# Patient Record
Sex: Male | Born: 1965 | Hispanic: No
Health system: Midwestern US, Community
[De-identification: ages and names within clinical notes are randomized; demographics above are authoritative.]

## PROBLEM LIST (undated history)

## (undated) DIAGNOSIS — Z139 Encounter for screening, unspecified: Secondary | ICD-10-CM

## (undated) DIAGNOSIS — R1909 Other intra-abdominal and pelvic swelling, mass and lump: Secondary | ICD-10-CM

## (undated) DIAGNOSIS — I10 Essential (primary) hypertension: Secondary | ICD-10-CM

## (undated) DIAGNOSIS — E119 Type 2 diabetes mellitus without complications: Secondary | ICD-10-CM

## (undated) DIAGNOSIS — C159 Malignant neoplasm of esophagus, unspecified: Secondary | ICD-10-CM

## (undated) DIAGNOSIS — D500 Iron deficiency anemia secondary to blood loss (chronic): Secondary | ICD-10-CM

## (undated) HISTORY — PX: OTHER SURGICAL HISTORY: SHX169

## (undated) HISTORY — DX: Malignant neoplasm of esophagus, unspecified: C15.9

## (undated) HISTORY — DX: Iron deficiency anemia secondary to blood loss (chronic): D50.0

---

## 2001-07-23 ENCOUNTER — Emergency Department (HOSPITAL_COMMUNITY): Admission: EM | Admit: 2001-07-23 | Discharge: 2001-07-23 | Payer: Self-pay | Admitting: Emergency Medicine

## 2002-08-26 ENCOUNTER — Emergency Department (HOSPITAL_COMMUNITY): Admission: EM | Admit: 2002-08-26 | Discharge: 2002-08-26 | Payer: Self-pay | Admitting: Emergency Medicine

## 2005-05-16 ENCOUNTER — Emergency Department (HOSPITAL_COMMUNITY): Admission: EM | Admit: 2005-05-16 | Discharge: 2005-05-16 | Payer: Self-pay | Admitting: Emergency Medicine

## 2006-12-27 ENCOUNTER — Emergency Department (HOSPITAL_COMMUNITY): Admission: EM | Admit: 2006-12-27 | Discharge: 2006-12-27 | Payer: Self-pay | Admitting: Emergency Medicine

## 2007-03-24 ENCOUNTER — Emergency Department (HOSPITAL_COMMUNITY): Admission: EM | Admit: 2007-03-24 | Discharge: 2007-03-24 | Payer: Self-pay | Admitting: Emergency Medicine

## 2007-06-23 ENCOUNTER — Emergency Department (HOSPITAL_COMMUNITY): Admission: EM | Admit: 2007-06-23 | Discharge: 2007-06-23 | Payer: Self-pay | Admitting: Emergency Medicine

## 2008-01-08 ENCOUNTER — Emergency Department (HOSPITAL_COMMUNITY): Admission: EM | Admit: 2008-01-08 | Discharge: 2008-01-08 | Payer: Self-pay | Admitting: Emergency Medicine

## 2008-12-06 ENCOUNTER — Emergency Department (HOSPITAL_COMMUNITY): Admission: EM | Admit: 2008-12-06 | Discharge: 2008-12-06 | Payer: Self-pay | Admitting: Emergency Medicine

## 2011-05-23 LAB — BASIC METABOLIC PANEL
BUN: 7
CO2: 29
Calcium: 8.3 — ABNORMAL LOW
Chloride: 100
Creatinine, Ser: 1.1
GFR calc Af Amer: >60
GFR calc non Af Amer: >60
Glucose, Bld: 122 — ABNORMAL HIGH
Potassium: 3.5
Sodium: 135

## 2011-05-23 LAB — CBC
HCT: 42
Hemoglobin: 14.2
MCHC: 33.8
MCV: 79
Platelets: 249
RBC: 5.31
RDW: 13.4
WBC: 7.3

## 2011-05-23 LAB — DIFFERENTIAL
Basophils Absolute: 0
Basophils Relative: 0
Eosinophils Absolute: 0.1
Eosinophils Relative: 2
Lymphocytes Relative: 30
Lymphs Abs: 2.2
Monocytes Absolute: 0.4
Monocytes Relative: 6
Neutro Abs: 4.5
Neutrophils Relative %: 62

## 2011-05-23 LAB — URIC ACID: Uric Acid, Serum: 8.9 — ABNORMAL HIGH

## 2011-05-29 LAB — SYNOVIAL CELL COUNT + DIFF, W/ CRYSTALS
Eosinophils-Synovial: 0
Lymphocytes-Synovial Fld: 2
Monocyte-Macrophage-Synovial Fluid: 14
Neutrophil, Synovial: 84
WBC, Synovial: 22310

## 2011-05-29 LAB — BODY FLUID CULTURE: Culture: NO GROWTH

## 2011-05-29 LAB — SEDIMENTATION RATE: Sed Rate: 5

## 2011-05-29 LAB — URIC ACID: Uric Acid, Serum: 9.6 — ABNORMAL HIGH

## 2012-12-18 ENCOUNTER — Encounter

## 2012-12-19 LAB — C REACTIVE PROTEIN, QT: C-Reactive protein: 0.33 mg/dL — ABNORMAL HIGH (ref <0.32)

## 2012-12-19 LAB — SED RATE, AUTOMATED: Sed rate, automated: 10 mm/hr (ref 0–15)

## 2012-12-20 LAB — ANA BY MULTIPLEX FLOW IA, QL
ANA, Direct: NEGATIVE
ANA: NEGATIVE

## 2012-12-20 LAB — DNA AB, DOUBLE STRANDED, IGG: Anti-DNA (DS) Ab, QT: <1 IU/mL (ref 0–9)

## 2012-12-20 LAB — SMITH ANTIBODIES: Smith Abs: <0.2 AI (ref 0.0–0.9)

## 2012-12-20 LAB — MISC. LAB TEST: Test Description:: 15271

## 2012-12-23 LAB — HLA-B27
HLA-B27: NEGATIVE
HLA-B27: NEGATIVE

## 2013-12-22 ENCOUNTER — Encounter

## 2013-12-23 LAB — GLUCOSE TOLERANCE,2 HR. W/URINES
GLUCOSE, 1 HR: 228 MG/DL
GLUCOSE, 2 HR: 105 MG/DL
GLUCOSE, FASTING: 114 MG/DL — ABNORMAL HIGH (ref 65–110)

## 2014-04-21 NOTE — Progress Notes (Signed)
[facility] SURGICAL SERVICES CASE SCHEDULING FORM  Phone:  [phone] Fax:  [phone]  Faxes are considered tentatively scheduled until confirmed by the [facility] scheduling office    Surgeon Name: Dr. Yetta Barre  Surgery Date & Time:  05/04/14  Surgery Location: SDS  Patient Name: Larry Fernandez  Patient Date Of Birth: 10-17-1965  Patient Social Security: [id number]   Gender:  male  Patient Phone: [phone]  Patient Type: Outpatient  Anesthesia Type:  Local mac  CPT Code(s):  11404      Patient Pre-op diagnosis: cyst left upper quadrant  Procedure: cyst excision left upper quadrant  Special Needs: none  Insurance (Primary and Secondary): [employer]  Insurance Policy #(s):      [employer]

## 2014-04-28 ENCOUNTER — Encounter

## 2014-04-28 LAB — METABOLIC PANEL, COMPREHENSIVE
A-G Ratio: 1.4 (ref 1.2–2.2)
ALT (SGPT): 37 U/L (ref 12–78)
AST (SGOT): 25 U/L (ref 15–37)
Albumin: 4.2 g/dL (ref 3.4–5.0)
Alk. phosphatase: 74 U/L (ref 45–117)
Anion gap: 5 mmol/L — ABNORMAL LOW (ref 6–15)
BUN/Creatinine ratio: 9 (ref 7–25)
BUN: 9 MG/DL (ref 7–18)
Bilirubin, total: 0.4 MG/DL (ref <1.1)
CO2: 29 mmol/L (ref 21–32)
Calcium: 8.4 MG/DL — ABNORMAL LOW (ref 8.5–10.1)
Chloride: 105 mmol/L (ref 98–107)
Creatinine: 1 MG/DL (ref 0.60–1.30)
GFR est AA: >60 mL/min/{1.73_m2} (ref >60)
GFR est non-AA: >60 mL/min/{1.73_m2} (ref >60)
Globulin: 3.1 g/dL (ref 2.4–3.5)
Glucose: 127 mg/dL — ABNORMAL HIGH (ref 70–110)
Potassium: 3.9 mmol/L (ref 3.5–5.3)
Protein, total: 7.3 g/dL (ref 6.4–8.2)
Sodium: 139 mmol/L (ref 136–145)

## 2014-04-28 LAB — CBC WITH AUTOMATED DIFF
ABS. BASOPHILS: 0 10*3/uL (ref 0.0–0.1)
ABS. EOSINOPHILS: 0.1 10*3/uL (ref 0.0–0.5)
ABS. LYMPHOCYTES: 2.4 10*3/uL (ref 0.8–3.5)
ABS. MONOCYTES: 0.4 10*3/uL — ABNORMAL LOW (ref 0.8–3.5)
ABS. NEUTROPHILS: 1.5 10*3/uL (ref 1.5–8.0)
BASOPHILS: 1 % (ref 0–2)
EOSINOPHILS: 2 % (ref 0–5)
HCT: 40.8 % — ABNORMAL LOW (ref 41–53)
HGB: 14.1 g/dL (ref 13.5–17.5)
LYMPHOCYTES: 52 % — ABNORMAL HIGH (ref 19–48)
MCH: 25.6 PG — ABNORMAL LOW (ref 27–31)
MCHC: 34.6 g/dL (ref 31–37)
MCV: 74 FL — ABNORMAL LOW (ref 78–98)
MONOCYTES: 9 % (ref 3–9)
MPV: 11 FL — ABNORMAL HIGH (ref 5.9–10.3)
NEUTROPHILS: 35 % — ABNORMAL LOW (ref 40–74)
NRBC: 0.8 PER 100 WBC — ABNORMAL HIGH
PLATELET: 263 10*3/uL (ref 130–400)
RBC: 5.51 M/uL (ref 4.7–6.1)
RDW: 13.7 % (ref 11.5–14.5)
WBC: 4.4 10*3/uL — ABNORMAL LOW (ref 4.5–10.8)

## 2014-05-04 ENCOUNTER — Inpatient Hospital Stay: Payer: PRIVATE HEALTH INSURANCE

## 2014-05-04 LAB — GLUCOSE, POC: Glucose (POC): 137 mg/dL — ABNORMAL HIGH (ref 70–110)

## 2014-05-04 MED ORDER — MIDAZOLAM 1 MG/ML IJ SOLN
1 mg/mL | Freq: Once | INTRAMUSCULAR | Status: DC
Start: 2014-05-04 — End: 2014-05-04

## 2014-05-04 MED ORDER — HYDROCODONE-ACETAMINOPHEN 5 MG-325 MG TAB
5-325 mg | ORAL_TABLET | Freq: Four times a day (QID) | ORAL | Status: AC | PRN
Start: 2014-05-04 — End: ?

## 2014-05-04 MED ORDER — LIDOCAINE-EPINEPHRINE 1 %-1:100,000 IJ SOLN
1 %-:00,000 | Freq: Once | INTRAMUSCULAR | Status: AC
Start: 2014-05-04 — End: 2014-05-04
  Administered 2014-05-04: 13:00:00 via SUBCUTANEOUS

## 2014-05-04 MED ORDER — FENTANYL CITRATE (PF) 50 MCG/ML IJ SOLN
50 mcg/mL | INTRAMUSCULAR | Status: DC | PRN
Start: 2014-05-04 — End: 2014-05-04

## 2014-05-04 MED ORDER — ONDANSETRON (PF) 4 MG/2 ML INJECTION
4 mg/2 mL | Freq: Once | INTRAMUSCULAR | Status: DC | PRN
Start: 2014-05-04 — End: 2014-05-04

## 2014-05-04 MED ADMIN — 0.9% sodium chloride irrigation 1000 mL: @ 13:00:00 | NDC 00409713809

## 2014-05-04 MED ADMIN — fentaNYL citrate (PF) injection: INTRAVENOUS | @ 13:00:00 | NDC 00641602725

## 2014-05-04 MED ADMIN — propofol (DIPRIVAN) 10 mg/mL injection: INTRAVENOUS | @ 13:00:00 | NDC 63323026929

## 2014-05-04 MED ADMIN — lidocaine (PF) (XYLOCAINE) 100 mg/5 mL (2 %) injection syringe: INTRAVENOUS | @ 13:00:00 | NDC 00409132305

## 2014-05-04 MED ADMIN — midazolam (VERSED) injection: INTRAVENOUS | @ 13:00:00 | NDC 00409230517

## 2014-05-04 MED ADMIN — lactated ringers infusion: INTRAVENOUS | @ 12:00:00 | NDC 00409795309

## 2014-05-04 MED FILL — MIDAZOLAM 1 MG/ML IJ SOLN: 1 mg/mL | INTRAMUSCULAR | Qty: 2

## 2014-05-04 MED FILL — NORMAL SALINE FLUSH 0.9 % INJECTION SYRINGE: INTRAMUSCULAR | Qty: 10

## 2014-05-04 MED FILL — LIDOCAINE-EPINEPHRINE 1 %-1:100,000 IJ SOLN: 1 %-:00,000 | INTRAMUSCULAR | Qty: 30

## 2014-05-04 MED FILL — SODIUM CHLORIDE 0.9 % IRRIGATION SOLN: 0.9 % | Qty: 1000

## 2014-05-04 MED FILL — LIDOCAINE (PF) 20 MG/ML (2 %) IV SYRINGE: 100 mg/5 mL (2 %) | INTRAVENOUS | Qty: 5

## 2014-05-04 MED FILL — DIPRIVAN 10 MG/ML INTRAVENOUS EMULSION: 10 mg/mL | INTRAVENOUS | Qty: 20

## 2014-05-04 MED FILL — LACTATED RINGERS IV: INTRAVENOUS | Qty: 1000

## 2014-05-04 MED FILL — FENTANYL CITRATE (PF) 50 MCG/ML IJ SOLN: 50 mcg/mL | INTRAMUSCULAR | Qty: 2

## 2014-05-04 NOTE — Op Note (Signed)
Op Notes by Rayburn Ma A at 05/04/14 3235                Author: Rayburn Ma A  Service: General Surgery  Author Type: Physician       Filed: 05/04/14 0920  Date of Service: 05/04/14 0916  Status: Signed          Editor: Leafy Kindle, M.D., F.A.C.S.   GENERAL SURGERY   [address]   [place] [facility] [address]   [phone]   FAX: [phone]               Operative Note         Patient: Larry Fernandez               Sex: male             MRN: [id number]        Date of Birth:  24-Feb-1966      Age:  48 y.o.                Preoperative Diagnosis: mass left upper quadrant      Postoperative Diagnosis:   no mass of left upper quadrant      Procedure:  Procedure(s):    left upper quadrant abdominal wall exploration, deep to posterior fascia      Surgeon: Surgeon(s) and Role:      * Sharlot Gowda, MD - Primary      Findings: asymmetric bulging of left rectus muscle without mass or hernia         Anesthesia:  MAC, LOCAL      Estimated Blood Loss: minimal                    Implants: * No implants in log *      Specimens: * No specimens in log *       Drains: None             Complications:  None             Counts: Sponge and needle counts were correct times two.         HPI:  GRAESYN SCHREIFELS   Presents for skin lesion removal.  The risk and benefits including bleeding, recurrence, need for additional  excision and infection were discussed.  The patient understands and desires to proceed.         Operation: The patient was evaluated in the preoperative holding area, and a surgical update was performed, procedure, procedure site and allergies were confirmed with the patient and nurse.  The patient  was taken to the operative suit.      After adequate MAC anesthesia was obtained, the surgical timeout was performed.  The procedure, site, allergies and timing of antibiotic were again confirmed.      The left  upper quadrant area marked in the preoperative hold area was prepped and draped in the usual sterile fashion.       Local was then injected into the skin and subcutaneous tissue.      The skin was then incised transversely and the incision was deepened to the muscular fascia.  There was no subcutaneous mass.  The anterior lateral  rectus fascia was opened and palpation revealed bulging rectus over the lower costal margin.  The muscle  was then gently spread along the course of the muscular fibers to expose the posterior rectus fascia.  There was no mass.   Palpation revealed less prominent bulging fascia over the lower costal margin. .      Meticulous hemostasis was ensured.  Irrigation was performed until the irrigant was clear.  The fascia was then re-approximated with 0.0 PDS interrupted x 2.  The skin was re-approximated with running 4.0 Monocryl.      Sterile dressing was applied.      The sponge, needle, and instrument count were correct at the end of the case x2, and prior to wound closure.  The patient tolerated the procedure without difficulty and was transported to the post op recovery room in good condition.                  Sharlot Gowda M.D. F.A.C.S.

## 2014-05-04 NOTE — H&P (Signed)
Sharlot Gowda, M.D., F.A.C.S.  GENERAL SURGERY  [address]  [place] [facility] [address]  [phone]  FAX: [phone]    History and Physical        Patient: Larry Fernandez               Sex: male             MRN: [id number]     Date of Birth:  [birth year]      Age:  48 y.o.              Subjective:     Larry Fernandez is a 48 y.o. male who presents with luq mass that is slowly increasing in size.  Complains of increased discomfort with certain body positions.      Review of Systems  A comprehensive review of systems was negative except for that written in the History of Present Illness.    Past Medical History   Diagnosis Date   ??? Hypertension    ??? Diabetes (HCC)      "borderline"       History reviewed. No pertinent past surgical history.    History reviewed. No pertinent family history.    History     Social History   ??? Marital Status: SINGLE     Spouse Name: N/A     Number of Children: N/A   ??? Years of Education: N/A     Social History Main Topics   ??? Smoking status: Former Smoker -- 0.15 packs/day for 5 years     Quit date: 08/14/1996   ??? Smokeless tobacco: Not on file   ??? Alcohol Use: No      Comment: none since 1999   ??? Drug Use: No   ??? Sexual Activity: Not on file     Other Topics Concern   ??? Not on file     Social History Narrative   ??? No narrative on file       Prior to Admission Medications   Prescriptions Last Dose Informant Patient Reported? Taking?   allopurinol (ZYLOPRIM) 100 mg tablet 05/03/2014 at Unknown time  Yes Yes   Sig: Take 100 mg by mouth daily.   amLODIPine (NORVASC) 10 mg tablet 05/03/2014 at Unknown time  Yes Yes   Sig: Take 10 mg by mouth daily.   aspirin delayed-release 81 mg tablet 05/03/2014 at Unknown time  Yes Yes   Sig: Take 81 mg by mouth daily.   metFORMIN (GLUCOPHAGE) 500 mg tablet 05/03/2014 at Unknown time  Yes Yes   Sig: Take 500 mg by mouth daily (with breakfast).      Facility-Administered Medications: None            No Known Allergies        Objective:      Visit Vitals   Item Reading   ??? BP 154/88 mmHg   ??? Pulse 72   ??? Temp 98 ??F (36.7 ??C)   ??? Resp 16   ??? Ht  (1.854 m)   ??? Wt 250 lb (113.399 kg)   ??? BMI 32.99 kg/m2   ??? SpO2 100%       Physical Exam:  General:  Alert, cooperative, well noursished, well developed, appears stated age   Eyes:  Sclera anicteric. Pupils equally round and reactive to light.   Mouth/Throat: Mucous membranes normal, oral pharynx clear   Neck: Supple   Lungs:  Clear to auscultation bilaterally, good effort   CV:  Regular rate and rhythm,no murmur, click, rub or gallop   Abdomen:   Soft, non-tender. bowel sounds normal. non-distended   Extremities: No cyanosis or edema   Skin: 5cm left upper quadrant, mobile, circumscribed subcutaneous mass   Lymph nodes: Cervical and supraclavicular normal   Musculoskeletal: No swelling or deformity   Lines/Devices:  Intact, no erythema, drainage or tenderness   Psych: Alert and oriented, normal mood affect given the setting       Labs:   Recent Results (from the past 24 hour(s))   GLUCOSE, POC    Collection Time: 05/04/14  7:27 AM   Result Value Ref Range    Glucose (POC) 137 (H) 70 - 110 mg/dL       All lab results for the last 24 hours reviewed.    No results for input(s): WBC, HGB, HCT, PLT, NA, K, CL, CO2, GLU, BUN, CREA, CA, MG, PHOS, ALB, TBIL, TBILI in the last 72 hours.    ABG's: No results for input(s): PH, PCO2, PO2, HCO3, FIO2 in the last 72 hours.      Data Review images and reports reviewed    Assessment/Plan       There are no hospital problems to display for this patient.      Patient Active Problem List   Diagnosis Code   ??? Lipoma of skin of abdomen 214.1         1.  Excision lipoma    The risk and benefits including bleeding and infection were discussed. The patient understands and desires to proceed.          Sharlot Gowda M.D. F.A.C.S.

## 2014-05-04 NOTE — Anesthesia Post-Procedure Evaluation (Signed)
Post-Anesthesia Evaluation and Assessment    Patient: Larry Fernandez MRN: [id number]  SSN: [id number]    Date of Birth: May 30, 1966  Age: 48 y.o.  Sex: male       Cardiovascular Function/Vital Signs  Visit Vitals   Item Reading   ??? BP 120/72 mmHg   ??? Pulse 67   ??? Temp 36.9 ??C (98.5 ??F)   ??? Resp 14   ??? Ht  (1.854 m)   ??? Wt 113.399 kg (250 lb)   ??? BMI 32.99 kg/m2   ??? SpO2 96%       Patient is status post MAC anesthesia for Procedure(s):   left upper quadrant abdominal wall exploration.    Nausea/Vomiting: None    Postoperative hydration reviewed and adequate.    Pain:  Pain Scale 1: Numeric (0 - 10) (05/04/14 0925)  Pain Intensity 1: 0 (05/04/14 0925)   Managed    Neurological Status:   Neuro (WDL): Within Defined Limits (05/04/14 0925)   At baseline    Mental Status and Level of Consciousness: Alert and oriented     Pulmonary Status:   O2 Device: Room air (05/04/14 0927)   Adequate oxygenation and airway patent    Complications related to anesthesia: None    Post-anesthesia assessment completed. No concerns    Signed By: Ezzard Standing, MD     May 04, 2014

## 2014-05-04 NOTE — Op Note (Signed)
Sharlot Gowda, M.D., F.A.C.S.  GENERAL SURGERY  [address]  [place] [facility] [address]  [phone]  FAX: [phone]          Operative Note      Patient: Larry Fernandez               Sex: male             MRN: [id number]      Date of Birth:  01-Oct-1965      Age:  48 y.o.              Preoperative Diagnosis: mass left upper quadrant    Postoperative Diagnosis:   no mass of left upper quadrant    Procedure:  Procedure(s):   left upper quadrant abdominal wall exploration, deep to posterior fascia    Surgeon: Surgeon(s) and Role:     * Sharlot Gowda, MD - Primary    Findings: asymmetric bulging of left rectus muscle without mass or hernia      Anesthesia:  MAC, LOCAL    Estimated Blood Loss: minimal                 Implants: * No implants in log *    Specimens: * No specimens in log *     Drains: None           Complications:  None           Counts: Sponge and needle counts were correct times two.      HPI:  Larry Fernandez   Presents for skin lesion removal.  The risk and benefits including bleeding, recurrence, need for additional excision and infection were discussed.  The patient understands and desires to proceed.      Operation: The patient was evaluated in the preoperative holding area, and a surgical update was performed, procedure, procedure site and allergies were confirmed with the patient and nurse.  The patient was taken to the operative suit.    After adequate MAC anesthesia was obtained, the surgical timeout was performed.  The procedure, site, allergies and timing of antibiotic were again confirmed.    The left upper quadrant area marked in the preoperative hold area was prepped and draped in the usual sterile fashion.     Local was then injected into the skin and subcutaneous tissue.    The skin was then incised transversely and the incision was deepened to the muscular fascia.  There was no subcutaneous mass.  The anterior  lateral rectus fascia was opened and palpation revealed bulging rectus over the lower costal margin.  The muscle was then gently spread along the course of the muscular fibers to expose the posterior rectus fascia.  There was no mass.   Palpation revealed less prominent bulging fascia over the lower costal margin. .    Meticulous hemostasis was ensured.  Irrigation was performed until the irrigant was clear.  The fascia was then re-approximated with 0.0 PDS interrupted x 2.  The skin was re-approximated with running 4.0 Monocryl.    Sterile dressing was applied.    The sponge, needle, and instrument count were correct at the end of the case x2, and prior to wound closure.  The patient tolerated the procedure without difficulty and was transported to the post op recovery room in good condition.            Sharlot Gowda M.D. F.A.C.S.

## 2014-05-04 NOTE — Anesthesia Pre-Procedure Evaluation (Addendum)
Anesthetic History   No history of anesthetic complications            Review of Systems / Medical History  Patient summary reviewed, nursing notes reviewed and pertinent labs reviewed    Pulmonary  Within defined limits                 Neuro/Psych   Within defined limits           Cardiovascular    Hypertension              Exercise tolerance: >4 METS  Comments: EKG: 04-27-14: SR   GI/Hepatic/Renal  Within defined limits              Endo/Other  Within defined limits           Other Findings            Physical Exam    Airway             Cardiovascular  Regular rate and rhythm,  S1 and S2 normal,  no murmur, click, rub, or gallop  Rhythm: regular  Rate: normal         Dental  No notable dental hx       Pulmonary  Breath sounds clear to auscultation               Abdominal  GI exam deferred       Other Findings          Body mass index is 32.99 kg/(m^2).  History     Social History   ??? Marital Status: SINGLE     Spouse Name: N/A     Number of Children: N/A   ??? Years of Education: N/A     Occupational History   ??? Not on file.     Social History Main Topics   ??? Smoking status: Former Smoker -- 0.15 packs/day for 5 years     Quit date: 08/14/1996   ??? Smokeless tobacco: Not on file   ??? Alcohol Use: No      Comment: none since 1999   ??? Drug Use: No   ??? Sexual Activity: Not on file     Other Topics Concern   ??? Not on file     Social History Narrative   ??? No narrative on file     Recent Labs      04/28/14   0900   WBC  4.4*   HGB  14.1   CREA  1.00   PLT  263   NA  139   K  3.9   CL  105     No results found for this or any previous visit.  EKG Results     None        No results found for this or any previous visit.        There is no immunization history on file for this patient.  No results found for: HCGUQC, [id number], [id number], [id number], PREGU, POCHCG, MHCGN, HCGQR, THCGA1, SHCG, HCGN, HCGSERUM, HCGURQLPOC  @  No results found for this or any previous visit.   No results found for this or any previous visit.  Visit Vitals   Item Reading   ??? Ht  (1.854 m)   ??? Wt 113.399 kg (250 lb)   ??? BMI 32.99 kg/m2       Name: Larry Fernandez  Age: 48 y.o.  Sex: male  DOB: 11-14-1965  MRS: [id number]  CSN: [id number]    Ezzard Standing, MD  05/04/2014  7:12 AM                         Anesthetic Plan    ASA: 1  Anesthesia type: MAC            Anesthetic plan and risks discussed with: Patient      MAC agreed, and/or general anesthesia if necessary

## 2015-08-25 ENCOUNTER — Emergency Department (HOSPITAL_COMMUNITY): Payer: BLUE CROSS/BLUE SHIELD

## 2015-08-25 ENCOUNTER — Encounter (HOSPITAL_COMMUNITY): Payer: Self-pay | Admitting: *Deleted

## 2015-08-25 ENCOUNTER — Emergency Department (HOSPITAL_COMMUNITY)
Admission: EM | Admit: 2015-08-25 | Discharge: 2015-08-25 | Disposition: A | Discharge status: Home / Self Care | Payer: BLUE CROSS/BLUE SHIELD | Attending: Emergency Medicine | Admitting: Emergency Medicine

## 2015-08-25 DIAGNOSIS — R200 Anesthesia of skin: Secondary | ICD-10-CM | POA: Insufficient documentation

## 2015-08-25 DIAGNOSIS — F17200 Nicotine dependence, unspecified, uncomplicated: Secondary | ICD-10-CM | POA: Insufficient documentation

## 2015-08-25 DIAGNOSIS — R0602 Shortness of breath: Secondary | ICD-10-CM | POA: Insufficient documentation

## 2015-08-25 DIAGNOSIS — R079 Chest pain, unspecified: Secondary | ICD-10-CM

## 2015-08-25 DIAGNOSIS — R0789 Other chest pain: Secondary | ICD-10-CM | POA: Insufficient documentation

## 2015-08-25 DIAGNOSIS — I10 Essential (primary) hypertension: Secondary | ICD-10-CM | POA: Diagnosis not present

## 2015-08-25 DIAGNOSIS — Z79899 Other long term (current) drug therapy: Secondary | ICD-10-CM | POA: Insufficient documentation

## 2015-08-25 DIAGNOSIS — E119 Type 2 diabetes mellitus without complications: Secondary | ICD-10-CM | POA: Insufficient documentation

## 2015-08-25 HISTORY — DX: Essential (primary) hypertension: I10

## 2015-08-25 HISTORY — DX: Type 2 diabetes mellitus without complications: E11.9

## 2015-08-25 LAB — BASIC METABOLIC PANEL
ANION GAP: 7 (ref 5–15)
BUN: 12 mg/dL (ref 6–20)
CO2: 27 mmol/L (ref 22–32)
Calcium: 8.9 mg/dL (ref 8.9–10.3)
Chloride: 102 mmol/L (ref 101–111)
Creatinine, Ser: 1.14 mg/dL (ref 0.61–1.24)
GFR calc Af Amer: >60 mL/min (ref >60)
GFR calc non Af Amer: >60 mL/min (ref >60)
Glucose, Bld: 112 mg/dL — ABNORMAL HIGH (ref 65–99)
POTASSIUM: 3.7 mmol/L (ref 3.5–5.1)
Sodium: 136 mmol/L (ref 135–145)

## 2015-08-25 LAB — CBC
HCT: 41.5 % (ref 39.0–52.0)
Hemoglobin: 14.6 g/dL (ref 13.0–17.0)
MCH: 26.2 pg (ref 26.0–34.0)
MCHC: 35.2 g/dL (ref 30.0–36.0)
MCV: 74.5 fL — ABNORMAL LOW (ref 78.0–100.0)
Platelets: 232 10*3/uL (ref 150–400)
RBC: 5.57 MIL/uL (ref 4.22–5.81)
RDW: 13.4 % (ref 11.5–15.5)
WBC: 5.7 10*3/uL (ref 4.0–10.5)

## 2015-08-25 LAB — TROPONIN I
Troponin I: <0.03 ng/mL (ref <0.031)
Troponin I: <0.03 ng/mL (ref <0.031)

## 2015-08-25 NOTE — ED Provider Notes (Signed)
TIME SEEN: 3:00 AM  CHIEF COMPLAINT: chest pain, shortness of breath  HPI: Pt is a 50 y.o. male with history of hypertension, diabetes who presents emergency department with 4-5 months of intermittent episodes of sharp, sore left-sided chest pressure. States that he feels that it causes his arm to "jump" and he will have some numbness up into his neck. He has had intermittent shortness of breath, nausea and dizziness. Also reports diaphoresis but does not have any of this currently and has not had this with his recent chest pain. States that over the past several days it has been more frequent. Episodes never last more than 1-2 seconds and then completely resolved. It does not appear to be exertional or pleuritic. States he works a very active job and never develops increased symptoms during work. States symptoms worse tonight with trying to sleep and he felt "not right". No history of hyperlipidemia. Does have a history of intermittent tobacco use. No prior history of stress test. No history of PE or DVT, recent fracture, surgery, trauma, lower extreme swelling or pain. No fever or cough.  Asymptomatic currently.  ROS: See HPI Constitutional: no fever  Eyes: no drainage  ENT: no runny nose   Cardiovascular:   chest pain  Resp:  SOB  GI: no vomiting GU: no dysuria Integumentary: no rash  Allergy: no hives  Musculoskeletal: no leg swelling  Neurological: no slurred speech ROS otherwise negative  PAST MEDICAL HISTORY/PAST SURGICAL HISTORY:  Past Medical History  Diagnosis Date  . Hypertension   . Diabetes mellitus without complication ([place])     MEDICATIONS:  Prior to Admission medications   Medication Sig Start Date End Date Taking? Authorizing Provider  losartan (COZAAR) 25 MG tablet Take 25 mg by mouth daily.   Yes Historical Provider, MD  metFORMIN (GLUCOPHAGE) 1000 MG tablet Take 1,000 mg by mouth 2 (two) times daily with a meal.   Yes Historical Provider, MD    ALLERGIES:  No  Known Allergies  SOCIAL HISTORY:  Social History  Substance Use Topics  . Smoking status: Current Some Day Smoker  . Smokeless tobacco: Not on file  . Alcohol Use: No    FAMILY HISTORY: History reviewed. No pertinent family history.  EXAM: BP 145/98 mmHg  Pulse 59  Temp(Src) 97.9 F (36.6 C) (Oral)  Resp 18  Ht 6\' 1"  (1.854 m)  Wt 241 lb (109.317 kg)  BMI 31.80 kg/m2  SpO2 99% CONSTITUTIONAL: Alert and oriented and responds appropriately to questions. Well-appearing; well-nourished HEAD: Normocephalic EYES: Conjunctivae clear, PERRL ENT: normal nose; no rhinorrhea; moist mucous membranes; pharynx without lesions noted NECK: Supple, no meningismus, no LAD  CARD: RRR; S1 and S2 appreciated; no murmurs, no clicks, no rubs, no gallops CHEST:  Chest wall nontender to palpation without crepitus, ecchymosis or deformity RESP: Normal chest excursion without splinting or tachypnea; breath sounds clear and equal bilaterally; no wheezes, no rhonchi, no rales, no hypoxia or respiratory distress, speaking full sentences ABD/GI: Normal bowel sounds; non-distended; soft, non-tender, no rebound, no guarding, no peritoneal signs BACK:  The back appears normal and is non-tender to palpation, there is no CVA tenderness EXT: Normal ROM in all joints; non-tender to palpation; no edema; normal capillary refill; no cyanosis, no calf tenderness or swelling, 2+ radial and DP pulses bilaterally    SKIN: Normal color for age and race; warm NEURO: Moves all extremities equally, sensation to light touch intact diffusely, cranial nerves II through XII intact PSYCH: The patient's mood and  manner are appropriate. Grooming and personal hygiene are appropriate.  MEDICAL DECISION MAKING: Patient here with very atypical chest pain. EKG shows incomplete right bundle branch block with no old for comparison. He has no risk factors for pulmonary. First cardiac labs negative. Chest x-ray clear. Given he does have risk  factors for ACS will obtain a second troponin have recommended outpatient follow-up for a stress test. Given he is asymptomatic doubt this is a pulmonary embolus or dissection. His custom patient that these may be muscle spasms given he feels like it'll make his arm "jump" or twitch.  I have low suspicion for life-threatening illness. Patient and wife are comfortable with plan for discharge of second troponin is negative.  ED PROGRESS: 6:20 AM  Pt's second troponin is negative. He is still chest pain-free and hemodynamically stable. He had one set of vital signs documented with a respiratory rate of 0 and this is incorrect. He has not been apneic during this visit.  Have recommended outpatient follow-up. We'll give him PCP follow-up information as well as cardiology follow-up information. History is very atypical but he does have risk factors for ACS and may benefit from a stress test. Discussed return precautions with patient and significant other at bedside. They verbalize understanding and are comfortable with this plan.    EKG Interpretation  Date/Time:  Wednesday August 25 2015 02:40:39 EST Ventricular Rate:  58 PR Interval:  178 QRS Duration: 112 QT Interval:  390 QTC Calculation: 383 R Axis:   72 Text Interpretation:  Sinus rhythm Incomplete right bundle branch block No old tracing to compare Confirmed by Adaisha Campise,  DO, Thersa Mohiuddin [phone]) on 08/25/2015 2:58:17 AM        [place], DO 08/25/15 [id number]

## 2015-08-25 NOTE — ED Notes (Signed)
Pt reporting left sided chest pain that began about two days ago.  Reports some SOB when he lays down and tries to sleep.  Also reports feeling some irregularity in heartbeat.

## 2015-08-25 NOTE — Discharge Instructions (Signed)
Nonspecific Chest Pain  °Chest pain can be caused by many different conditions. There is always a chance that your pain could be related to something serious, such as a heart attack or a blood clot in your lungs. Chest pain can also be caused by conditions that are not life-threatening. If you have chest pain, it is very important to follow up with your health care provider. °CAUSES  °Chest pain can be caused by: °· Heartburn. °· Pneumonia or bronchitis. °· Anxiety or stress. °· Inflammation around your heart (pericarditis) or lung (pleuritis or pleurisy). °· A blood clot in your lung. °· A collapsed lung (pneumothorax). It can develop suddenly on its own (spontaneous pneumothorax) or from trauma to the chest. °· Shingles infection (varicella-zoster virus). °· Heart attack. °· Damage to the bones, muscles, and cartilage that make up your chest wall. This can include: °¨ Bruised bones due to injury. °¨ Strained muscles or cartilage due to frequent or repeated coughing or overwork. °¨ Fracture to one or more ribs. °¨ Sore cartilage due to inflammation (costochondritis). °RISK FACTORS  °Risk factors for chest pain may include: °· Activities that increase your risk for trauma or injury to your chest. °· Respiratory infections or conditions that cause frequent coughing. °· Medical conditions or overeating that can cause heartburn. °· Heart disease or family history of heart disease. °· Conditions or health behaviors that increase your risk of developing a blood clot. °· Having had chicken pox (varicella zoster). °SIGNS AND SYMPTOMS °Chest pain can feel like: °· Burning or tingling on the surface of your chest or deep in your chest. °· Crushing, pressure, aching, or squeezing pain. °· Dull or sharp pain that is worse when you move, cough, or take a deep breath. °· Pain that is also felt in your back, neck, shoulder, or arm, or pain that spreads to any of these areas. °Your chest pain may come and go, or it may stay  constant. °DIAGNOSIS °Lab tests or other studies may be needed to find the cause of your pain. Your health care provider may have you take a test called an ambulatory ECG (electrocardiogram). An ECG records your heartbeat patterns at the time the test is performed. You may also have other tests, such as: °· Transthoracic echocardiogram (TTE). During echocardiography, sound waves are used to create a picture of all of the heart structures and to look at how blood flows through your heart. °· Transesophageal echocardiogram (TEE). This is a more advanced imaging test that obtains images from inside your body. It allows your health care provider to see your heart in finer detail. °· Cardiac monitoring. This allows your health care provider to monitor your heart rate and rhythm in real time. °· Holter monitor. This is a portable device that records your heartbeat and can help to diagnose abnormal heartbeats. It allows your health care provider to track your heart activity for several days, if needed. °· Stress tests. These can be done through exercise or by taking medicine that makes your heart beat more quickly. °· Blood tests. °· Imaging tests. °TREATMENT  °Your treatment depends on what is causing your chest pain. Treatment may include: °· Medicines. These may include: °¨ Acid blockers for heartburn. °¨ Anti-inflammatory medicine. °¨ Pain medicine for inflammatory conditions. °¨ Antibiotic medicine, if an infection is present. °¨ Medicines to dissolve blood clots. °¨ Medicines to treat coronary artery disease. °· Supportive care for conditions that do not require medicines. This may include: °¨ Resting. °¨ Applying heat   or cold packs to injured areas. °¨ Limiting activities until pain decreases. °HOME CARE INSTRUCTIONS °· If you were prescribed an antibiotic medicine, finish it all even if you start to feel better. °· Avoid any activities that bring on chest pain. °· Do not use any tobacco products, including  cigarettes, chewing tobacco, or electronic cigarettes. If you need help quitting, ask your health care provider. °· Do not drink alcohol. °· Take medicines only as directed by your health care provider. °· Keep all follow-up visits as directed by your health care provider. This is important. This includes any further testing if your chest pain does not go away. °· If heartburn is the cause for your chest pain, you may be told to keep your head raised (elevated) while sleeping. This reduces the chance that acid will go from your stomach into your esophagus. °· Make lifestyle changes as directed by your health care provider. These may include: °¨ Getting regular exercise. Ask your health care provider to suggest some activities that are safe for you. °¨ Eating a heart-healthy diet. A registered dietitian can help you to learn healthy eating options. °¨ Maintaining a healthy weight. °¨ Managing diabetes, if necessary. °¨ Reducing stress. °SEEK MEDICAL CARE IF: °· Your chest pain does not go away after treatment. °· You have a rash with blisters on your chest. °· You have a fever. °SEEK IMMEDIATE MEDICAL CARE IF:  °· Your chest pain is worse. °· You have an increasing cough, or you cough up blood. °· You have severe abdominal pain. °· You have severe weakness. °· You faint. °· You have chills. °· You have sudden, unexplained chest discomfort. °· You have sudden, unexplained discomfort in your arms, back, neck, or jaw. °· You have shortness of breath at any time. °· You suddenly start to sweat, or your skin gets clammy. °· You feel nauseous or you vomit. °· You suddenly feel light-headed or dizzy. °· Your heart begins to beat quickly, or it feels like it is skipping beats. °These symptoms may represent a serious problem that is an emergency. Do not wait to see if the symptoms will go away. Get medical help right away. Call your local emergency services (911 in the [place] Do not drive yourself to the hospital. °  °This  information is not intended to replace advice given to you by your health care provider. Make sure you discuss any questions you have with your health care provider. °  °Document Released: 05/10/2005 Document Revised: 08/21/2014 Document Reviewed: 03/06/2014 °Elsevier Interactive Patient Education ©2016 [employer] Inc[employer] ° °

## 2015-08-25 NOTE — ED Notes (Signed)
Pt alert & oriented x4, stable gait. Patient given discharge instructions, paperwork & prescription(s). Patient  instructed to stop at the registration desk to finish any additional paperwork. Patient verbalized understanding. Pt left department w/ no further questions. 

## 2016-09-02 ENCOUNTER — Emergency Department (HOSPITAL_COMMUNITY)
Admission: EM | Admit: 2016-09-02 | Discharge: 2016-09-02 | Discharge status: Against Medical Advice | Payer: Managed Care, Other (non HMO)

## 2016-12-19 ENCOUNTER — Encounter: Payer: Self-pay | Admitting: Internal Medicine

## 2017-01-05 ENCOUNTER — Encounter: Payer: Self-pay | Admitting: Gastroenterology

## 2017-01-05 ENCOUNTER — Ambulatory Visit (INDEPENDENT_AMBULATORY_CARE_PROVIDER_SITE_OTHER): Payer: Self-pay | Admitting: Gastroenterology

## 2017-01-05 ENCOUNTER — Other Ambulatory Visit: Payer: Self-pay

## 2017-01-05 DIAGNOSIS — R1310 Dysphagia, unspecified: Secondary | ICD-10-CM | POA: Insufficient documentation

## 2017-01-05 MED ORDER — PANTOPRAZOLE SODIUM 40 MG PO TBEC: 40.0000 mg | DELAYED_RELEASE_TABLET | Freq: Every day | ORAL | 3 refills | Status: DC

## 2017-01-05 NOTE — Patient Instructions (Addendum)
I have sent Protonix to your pharmacy to take first thing each morning on an empty stomach, 30 minutes before breakfast.  We have scheduled you for an upper endoscopy with dilation by Dr. Oneida Alar in the near future.  Follow a soft diet for now. I will talk to her about the timing of the colonoscopy as well.   Soft-Food Meal Plan A soft-food meal plan includes foods that are safe and easy to swallow. This meal plan typically is used:  If you are having trouble chewing or swallowing foods.  As a transition meal plan after only having had liquid meals for a long period. What do I need to know about the soft-food meal plan? A soft-food meal plan includes tender foods that are soft and easy to chew and swallow. In most cases, bite-sized pieces of food are easier to swallow. A bite-sized piece is about  inch or smaller. Foods in this plan do not need to be ground or pureed. Foods that are very hard, crunchy, or sticky should be avoided. Also, breads, cereals, yogurts, and desserts with nuts, seeds, or fruits should be avoided. What foods can I eat? Grains  Rice and wild rice. Moist bread, dressing, pasta, and noodles. Well-moistened dry or cooked cereals, such as farina (cooked wheat cereal), oatmeal, or grits. Biscuits, breads, muffins, pancakes, and waffles that have been well moistened. Vegetables  Shredded lettuce. Cooked, tender vegetables, including potatoes without skins. Vegetable juices. Broths or creamed soups made with vegetables that are not stringy or chewy. Strained tomatoes (without seeds). Fruits  Canned or well-cooked fruits. Soft (ripe), peeled fresh fruits, such as peaches, nectarines, kiwi, cantaloupe, honeydew melon, and watermelon (without seeds). Soft berries with small seeds, such as strawberries. Fruit juices (without pulp). Meats and Other Protein Sources  Moist, tender, lean beef. Mutton. Lamb. Veal. Chicken. [place]. Liver. Ham. Fish without bones. Eggs. Dairy  Milk,  milk drinks, and cream. Plain cream cheese and cottage cheese. Plain yogurt. Sweets/Desserts  Flavored gelatin desserts. Custard. Plain ice cream, frozen yogurt, sherbet, milk shakes, and malts. Plain cakes and cookies. Plain hard candy. Other  Butter, margarine (without trans fat), and cooking oils. Mayonnaise. Cream sauces. Mild spices, salt, and sugar. Syrup, molasses, honey, and jelly. The items listed above may not be a complete list of recommended foods or beverages. Contact your dietitian for more options.  What foods are not recommended? Grains  Dry bread, toast, crackers that have not been moistened. Coarse or dry cereals, such as bran, granola, and shredded wheat. Tough or chewy crusty breads, such as [place] bread or baguettes. Vegetables  Corn. Raw vegetables except shredded lettuce. Cooked vegetables that are tough or stringy. Tough, crisp, fried potatoes and potato skins. Fruits  Fresh fruits with skins or seeds or both, such as apples, pears, or grapes. Stringy, high-pulp fruits, such as papaya, pineapple, coconut, or mango. Fruit leather, fruit roll-ups, and all dried fruits. Meats and Other Protein Sources  Sausages and hot dogs. Meats with gristle. Fish with bones. Nuts, seeds, and chunky peanut or other nut butters. Sweets/Desserts  Cakes or cookies that are very dry or chewy. The items listed above may not be a complete list of foods and beverages to avoid. Contact your dietitian for more information.  This information is not intended to replace advice given to you by your health care provider. Make sure you discuss any questions you have with your health care provider. Document Released: 11/07/2007 Document Revised: 01/06/2016 Document Reviewed: 06/27/2013 Elsevier Interactive Patient Education  2017 Elsevier Inc.  

## 2017-01-05 NOTE — Progress Notes (Addendum)
REVIEWED-NO ADDITIONAL RECOMMENDATIONS.  Primary Care Physician:  [name], FNP Primary Gastroenterologist:  Dr. Oneida Alar   Chief Complaint  Patient presents with  . Dysphagia    food gets stuck in chest, sometimes has to make himself vomit  . Abdominal Pain    some days    HPI:   Maxwell Henderson is a 51 y.o. male presenting today at the request of his PCP secondary to accidental ingestion of weed killer (unsure the brand) in March. He states that he had poured leftover weed killer in a mountain dew bottle and left on the deck. His wife then took the bottle and put in fridge, thinking it was a drink. Patient opened the fridge to get something to drink and drank from the mountain dew bottle a "small" mouthful, unable to quantify exactly. He states he swallowed this and immediately knew it was the weed killer. He then decided to drink milk, thinking it would coat the stomach. He vomited 4 times following this, with the fourth episode of emesis relieving all of his discomfort. He decided he would monitor his symptoms and did not seek emergent care. He states he has noted slow onset of solid food dysphagia and epigastric discomfort intermittently, although tolerable. However, he is quite concerned about this, as he is now afraid to eat for fear of choking at times.   Food feels like it hangs in epigastric region. Will have to vomit food at times. Some days will be ok, some days not. Tougher textures are difficult. Losing weight because doesn't want to eat. Starting to have some odynophagia as well. States it isn't severe and it is "bearable". Feels like dysphagia is more often now. Tries to chew food really well but without improvement.   No rectal bleeding. Saw some dark brown emesis after brushing his teeth after accidental ingestion. Resolved now. Was gagging while brushing his teeth.   Bowel movements were alternating between constipation/diarrhea but now resolved. No N/V. No overt GI  bleeding. No prior colonoscopy or EGD. He has purposefully lost weight from 265 to the 220 range with dietary changes. However, he is concerned that he is losing weight now due to decreased intake.   Past Medical History:  Diagnosis Date  . Diabetes mellitus without complication ([place])   . Hypertension     Past Surgical History:  Procedure Laterality Date  . lipoma removal      Current Outpatient Prescriptions  Medication Sig Dispense Refill  . aspirin EC 81 MG tablet Take 81 mg by mouth daily.    Marland Kitchen loratadine (CLARITIN) 10 MG tablet Take 10 mg by mouth daily.    Marland Kitchen losartan (COZAAR) 25 MG tablet Take 25 mg by mouth daily.    . metFORMIN (GLUCOPHAGE) 1000 MG tablet Take 1,000 mg by mouth 2 (two) times daily with a meal.    . Omega-3 Fatty Acids (FISH OIL) 500 MG CAPS Take by mouth daily.    . pantoprazole (PROTONIX) 40 MG tablet Take 1 tablet (40 mg total) by mouth daily. Take 30 minutes before breakfast 90 tablet 3   No current facility-administered medications for this visit.     Allergies as of 01/05/2017  . (No Known Allergies)    Family History  Problem Relation Age of Onset  . Prostate cancer Father   . Colon cancer Neg Hx   . Colon polyps Neg Hx     Social History   Social History  . Marital status: Married  Spouse name: N/A  . Number of children: N/A  . Years of education: N/A   Occupational History  . Not on file.   Social History Main Topics  . Smoking status: Current Some Day Smoker  . Smokeless tobacco: Never Used  . Alcohol use No     Comment: hx heavy alcohol, hasn't drank in 19 yrs  . Drug use: No     Comment: history of marijuana   . Sexual activity: Not on file   Other Topics Concern  . Not on file   Social History Narrative  . No narrative on file    Review of Systems: Gen: see HPI  CV: Denies chest pain, heart palpitations, peripheral edema, syncope.  Resp: Denies shortness of breath at rest or with exertion. Denies wheezing or  cough.  GI: see HPI  GU : Denies urinary burning, urinary frequency, urinary hesitancy MS: Denies joint pain, muscle weakness, cramps, or limitation of movement.  Derm: Denies rash, itching, dry skin Psych: Denies depression, anxiety, memory loss, and confusion Heme: see HPI   Physical Exam: BP (!) 141/86   Pulse 77   Temp 97.3 F (36.3 C) (Oral)   Ht 6\' 1"  (1.854 m)   Wt 209 lb 3.2 oz (94.9 kg)   BMI 27.60 kg/m  General:   Alert and oriented. Pleasant and cooperative. Well-nourished and well-developed.  Head:  Normocephalic and atraumatic. Eyes:  Without icterus, sclera clear and conjunctiva pink.  Ears:  Normal auditory acuity. Nose:  No deformity, discharge,  or lesions. Mouth:  No deformity or lesions, oral mucosa pink.  Lungs:  Clear to auscultation bilaterally. No wheezes, rales, or rhonchi. No distress.  Heart:  S1, S2 present without murmurs appreciated.  Abdomen:  +BS, soft, non-tender and non-distended. No HSM noted. No guarding or rebound. No masses appreciated.  Rectal:  Deferred  Msk:  Symmetrical without gross deformities. Normal posture. Extremities:  Without edema. Neurologic:  Alert and  oriented x4;  grossly normal neurologically. Psych:  Alert and cooperative. Normal mood and affect.

## 2017-01-05 NOTE — Assessment & Plan Note (Addendum)
51 year old male who presents with history significant for accidental swallowing of weed killer (brand unknown) in March, not seeking medical treatment at that time. He has noted slow, progressive solid food dysphagia since that time and epigastric discomfort. No acute findings at time of exam today. Needs diagnostic EGD for further evaluation. I called poison control to discuss case and have on file, and I am awaiting to hear back from patient the exact preparation.  I have asked him to stay with a soft diet for now, added Protonix once daily, and will arrange for procedure in near future with Propofol by Dr. Oneida Alar. As of note, he needs initial screening colonoscopy at some point, but his upper GI symptoms take precedence at this time. Discussed any future accidental ingestion of caustic agents should be addressed immediately at time of occurrence. He states understanding. He is to call with any changes in symptoms in the interim.

## 2017-01-09 ENCOUNTER — Telehealth: Payer: Self-pay | Admitting: Gastroenterology

## 2017-01-09 NOTE — Progress Notes (Signed)
CC'D TO PCP °

## 2017-01-09 NOTE — Telephone Encounter (Signed)
Reviewed with Dr. Oneida Alar. Please arrange for EGD/dilation with Propofol on June 25th or June 28th at 0730.

## 2017-01-10 ENCOUNTER — Other Ambulatory Visit: Payer: Self-pay

## 2017-01-10 ENCOUNTER — Telehealth: Payer: Self-pay

## 2017-01-10 DIAGNOSIS — R1310 Dysphagia, unspecified: Secondary | ICD-10-CM

## 2017-01-10 NOTE — Telephone Encounter (Signed)
Pt was returning a call from GF. Please call him back at [phone]

## 2017-01-10 NOTE — Telephone Encounter (Signed)
See other phone note

## 2017-01-10 NOTE — Telephone Encounter (Signed)
Tired  To call with no answer

## 2017-01-10 NOTE — Telephone Encounter (Signed)
Pt is set up for EGD/ED with propofol on 02/05/17 @ 7:30 am. He is aware and instructions are in the mail.

## 2017-01-10 NOTE — Telephone Encounter (Signed)
REVIEWED. AGREE. NO ADDITIONAL RECOMMENDATIONS. 

## 2017-01-15 ENCOUNTER — Other Ambulatory Visit: Payer: Self-pay | Admitting: Gastroenterology

## 2017-01-15 ENCOUNTER — Other Ambulatory Visit: Payer: Self-pay

## 2017-01-15 ENCOUNTER — Other Ambulatory Visit (HOSPITAL_COMMUNITY)
Admission: RE | Admit: 2017-01-15 | Discharge: 2017-01-15 | Disposition: A | Discharge status: Home / Self Care | Payer: Managed Care, Other (non HMO) | Source: Ambulatory Visit | Attending: Gastroenterology | Admitting: Gastroenterology

## 2017-01-15 DIAGNOSIS — R1310 Dysphagia, unspecified: Secondary | ICD-10-CM

## 2017-01-15 LAB — BASIC METABOLIC PANEL
Anion gap: 7 (ref 5–15)
BUN: 8 mg/dL (ref 6–20)
CALCIUM: 8.7 mg/dL — AB (ref 8.9–10.3)
CO2: 26 mmol/L (ref 22–32)
Chloride: 105 mmol/L (ref 101–111)
Creatinine, Ser: 1.1 mg/dL (ref 0.61–1.24)
GFR calc Af Amer: >60 mL/min (ref >60)
GLUCOSE: 129 mg/dL — AB (ref 65–99)
Potassium: 3.7 mmol/L (ref 3.5–5.1)
Sodium: 138 mmol/L (ref 135–145)

## 2017-01-15 LAB — CBC WITH DIFFERENTIAL/PLATELET
Basophils Absolute: 0 10*3/uL (ref 0.0–0.1)
Basophils Relative: 0 %
EOS PCT: 2 %
Eosinophils Absolute: 0.1 10*3/uL (ref 0.0–0.7)
HEMATOCRIT: 29.7 % — AB (ref 39.0–52.0)
Hemoglobin: 9.5 g/dL — ABNORMAL LOW (ref 13.0–17.0)
LYMPHS ABS: 2.4 10*3/uL (ref 0.7–4.0)
LYMPHS PCT: 39 %
MCH: 21 pg — AB (ref 26.0–34.0)
MCHC: 32 g/dL (ref 30.0–36.0)
MCV: 65.6 fL — AB (ref 78.0–100.0)
MONOS PCT: 9 %
Monocytes Absolute: 0.5 10*3/uL (ref 0.1–1.0)
Neutro Abs: 3.1 10*3/uL (ref 1.7–7.7)
Neutrophils Relative %: 51 %
PLATELETS: 348 10*3/uL (ref 150–400)
RBC: 4.53 MIL/uL (ref 4.22–5.81)
RDW: 15.4 % (ref 11.5–15.5)
WBC: 6.1 10*3/uL (ref 4.0–10.5)

## 2017-01-15 NOTE — Telephone Encounter (Signed)
REVIEWED. WEIGHT 241 LBS JAN 2017-->MAY 2018 209 LBS. UNINTENTIONAL WEIGHT LOSS/DYSPHAGIA. AGREE PT NEEDS URGENT EGD/DIL W/ MAC JUN 5.

## 2017-01-15 NOTE — Telephone Encounter (Signed)
Spoke with Maxwell Henderson, he said it was ok to put the pt on for 1:15 tomorrow and have him come in 1.5 hours early for the procedure and they will do the EKG at that time. Ginger, please put the orders in for this and let the pt and endo know.

## 2017-01-15 NOTE — Telephone Encounter (Signed)
Spoke with Dr Oneida Alar and she stated we could order STAT labs(CBC, BMP) today and add the patient on for the end of the day.  Please make sure the patient is NPO after midnight.  Routing to Ginger

## 2017-01-15 NOTE — Telephone Encounter (Signed)
Talked with patient and he is going to the hospital to have stat blood work done and nothing to eat or drink after midnight and someone will call him in the morning.

## 2017-01-15 NOTE — Telephone Encounter (Signed)
Pt is up set for EGD with Propofol on 02/05/17. He is having trouble swallowing and losing weight. He said that if he has to wait 25 days he will be dead. I told him that if he could not swallow that he could go to the ER. Then he got mad and said why did he come to see [place] in the first place. He is not wanting to wait for the EGD. Please advise

## 2017-01-16 ENCOUNTER — Telehealth: Payer: Self-pay | Admitting: Gastroenterology

## 2017-01-16 ENCOUNTER — Encounter (HOSPITAL_COMMUNITY): Payer: Self-pay

## 2017-01-16 ENCOUNTER — Ambulatory Visit (HOSPITAL_COMMUNITY): Payer: Medicaid Other | Admitting: Anesthesiology

## 2017-01-16 ENCOUNTER — Encounter (HOSPITAL_COMMUNITY)
Admission: RE | Disposition: A | Discharge status: Home / Self Care | Payer: Self-pay | Source: Ambulatory Visit | Attending: Gastroenterology

## 2017-01-16 ENCOUNTER — Ambulatory Visit (HOSPITAL_COMMUNITY)
Admission: RE | Admit: 2017-01-16 | Discharge: 2017-01-16 | Disposition: A | Discharge status: Home / Self Care | Payer: Medicaid Other | Source: Ambulatory Visit | Attending: Gastroenterology | Admitting: Gastroenterology

## 2017-01-16 ENCOUNTER — Other Ambulatory Visit: Payer: Self-pay

## 2017-01-16 DIAGNOSIS — Z6827 Body mass index (BMI) 27.0-27.9, adult: Secondary | ICD-10-CM | POA: Diagnosis not present

## 2017-01-16 DIAGNOSIS — Z7982 Long term (current) use of aspirin: Secondary | ICD-10-CM | POA: Diagnosis not present

## 2017-01-16 DIAGNOSIS — K219 Gastro-esophageal reflux disease without esophagitis: Secondary | ICD-10-CM | POA: Diagnosis not present

## 2017-01-16 DIAGNOSIS — K228 Other specified diseases of esophagus: Secondary | ICD-10-CM

## 2017-01-16 DIAGNOSIS — R634 Abnormal weight loss: Secondary | ICD-10-CM | POA: Diagnosis not present

## 2017-01-16 DIAGNOSIS — C169 Malignant neoplasm of stomach, unspecified: Secondary | ICD-10-CM

## 2017-01-16 DIAGNOSIS — K2289 Other specified disease of esophagus: Secondary | ICD-10-CM

## 2017-01-16 DIAGNOSIS — Z7984 Long term (current) use of oral hypoglycemic drugs: Secondary | ICD-10-CM | POA: Diagnosis not present

## 2017-01-16 DIAGNOSIS — K2970 Gastritis, unspecified, without bleeding: Secondary | ICD-10-CM | POA: Diagnosis not present

## 2017-01-16 DIAGNOSIS — C159 Malignant neoplasm of esophagus, unspecified: Secondary | ICD-10-CM

## 2017-01-16 DIAGNOSIS — R198 Other specified symptoms and signs involving the digestive system and abdomen: Secondary | ICD-10-CM

## 2017-01-16 DIAGNOSIS — I10 Essential (primary) hypertension: Secondary | ICD-10-CM | POA: Insufficient documentation

## 2017-01-16 DIAGNOSIS — R1310 Dysphagia, unspecified: Secondary | ICD-10-CM

## 2017-01-16 DIAGNOSIS — F17210 Nicotine dependence, cigarettes, uncomplicated: Secondary | ICD-10-CM | POA: Insufficient documentation

## 2017-01-16 DIAGNOSIS — E119 Type 2 diabetes mellitus without complications: Secondary | ICD-10-CM | POA: Insufficient documentation

## 2017-01-16 DIAGNOSIS — C155 Malignant neoplasm of lower third of esophagus: Secondary | ICD-10-CM | POA: Diagnosis not present

## 2017-01-16 DIAGNOSIS — C160 Malignant neoplasm of cardia: Secondary | ICD-10-CM | POA: Diagnosis not present

## 2017-01-16 HISTORY — PX: SAVORY DILATION: SHX5439

## 2017-01-16 HISTORY — PX: ESOPHAGOGASTRODUODENOSCOPY (EGD) WITH PROPOFOL: SHX5813

## 2017-01-16 LAB — GLUCOSE, CAPILLARY: GLUCOSE-CAPILLARY: 88 mg/dL (ref 65–99)

## 2017-01-16 SURGERY — ESOPHAGOGASTRODUODENOSCOPY (EGD) WITH PROPOFOL
Anesthesia: Monitor Anesthesia Care

## 2017-01-16 MED ORDER — STERILE WATER FOR IRRIGATION IR SOLN
Status: DC | PRN
  Administered 2017-01-16: 100 mL

## 2017-01-16 MED ORDER — PROPOFOL 500 MG/50ML IV EMUL
INTRAVENOUS | Status: DC | PRN
  Administered 2017-01-16: 140 ug/kg/min via INTRAVENOUS

## 2017-01-16 MED ORDER — LACTATED RINGERS IV SOLN
INTRAVENOUS | Status: DC
  Administered 2017-01-16 (×2): via INTRAVENOUS

## 2017-01-16 MED ORDER — LIDOCAINE VISCOUS 2 % MT SOLN
OROMUCOSAL | Status: AC
  Filled 2017-01-16: qty 15

## 2017-01-16 MED ORDER — PROPOFOL 10 MG/ML IV BOLUS
INTRAVENOUS | Status: AC
  Filled 2017-01-16: qty 20

## 2017-01-16 MED ORDER — EPINEPHRINE PF 1 MG/10ML IJ SOSY
PREFILLED_SYRINGE | INTRAMUSCULAR | Status: AC
  Filled 2017-01-16: qty 10

## 2017-01-16 MED ORDER — CHLORHEXIDINE GLUCONATE CLOTH 2 % EX PADS: 6.0000 | MEDICATED_PAD | Freq: Once | CUTANEOUS | Status: DC

## 2017-01-16 MED ORDER — FENTANYL CITRATE (PF) 100 MCG/2ML IJ SOLN
25.0000 ug | INTRAMUSCULAR | Status: DC | PRN
  Administered 2017-01-16: 25 ug via INTRAVENOUS

## 2017-01-16 MED ORDER — FENTANYL CITRATE (PF) 100 MCG/2ML IJ SOLN
INTRAMUSCULAR | Status: AC
  Filled 2017-01-16: qty 2

## 2017-01-16 MED ORDER — MIDAZOLAM HCL 2 MG/2ML IJ SOLN
1.0000 mg | INTRAMUSCULAR | Status: DC | PRN
  Administered 2017-01-16: 2 mg via INTRAVENOUS

## 2017-01-16 MED ORDER — SODIUM CHLORIDE 0.9 % IJ SOLN
INTRAMUSCULAR | Status: DC | PRN
  Administered 2017-01-16: 2 mL

## 2017-01-16 MED ORDER — MIDAZOLAM HCL 2 MG/2ML IJ SOLN
INTRAMUSCULAR | Status: AC
  Filled 2017-01-16: qty 2

## 2017-01-16 NOTE — Op Note (Signed)
[facility] Patient Name: Maxwell Henderson Procedure Date: 01/16/2017 1:25 PM MRN: [id number] Date of Birth: [birth year] Attending MD: Barney Drain , MD CSN: [id number] Age: 51 Admit Type: Outpatient Procedure:                Upper GI endoscopy with APC-CONTROL BLEEDING/COLD                            FORCEPS BIOPSY/EPI INJECTION Indications:              Dysphagia, Weight loss Providers:                Barney Drain, MD, Janeece Riggers, RN, Randa Spike,                            Technician Referring MD:             Deliah Goody. House Medicines:                Propofol per Anesthesia Complications:            No immediate complications. Estimated Blood Loss:     Estimated blood loss: 50 mL requiring treatment                            with electrocautery. Estimated blood loss: none. Procedure:                Pre-Anesthesia Assessment:                           - Prior to the procedure, a History and Physical                            was performed, and patient medications and                            allergies were reviewed. The patient's tolerance of                            previous anesthesia was also reviewed. The risks                            and benefits of the procedure and the sedation                            options and risks were discussed with the patient.                            All questions were answered, and informed consent                            was obtained. Prior Anticoagulants: The patient has                            taken aspirin, last dose was 1 day prior to  procedure. ASA Grade Assessment: I - A normal,                            healthy patient. After reviewing the risks and                            benefits, the patient was deemed in satisfactory                            condition to undergo the procedure. After obtaining                            informed consent, the endoscope was passed under                direct vision. Throughout the procedure, the                            patient's blood pressure, pulse, and oxygen                            saturations were monitored continuously. The                            [id number] ([id number]) scope was introduced through the                            mouth, and advanced to the second part of duodenum.                            The upper GI endoscopy was technically difficult                            and complex due to excessive bleeding. Successful                            completion of the procedure was aided by                            controlling the bleeding. The patient tolerated the                            procedure well. Scope In: 1:58:04 PM Scope Out: 2:22:36 PM Total Procedure Duration: 0 hours 24 minutes 32 seconds  Findings:      A medium-sized, fungating mass with no bleeding and no stigmata of       recent bleeding was found in the distal esophagus, 38 to 42 cm from the       incisors. The mass was partially obstructing and partially       circumferential (involving two thirds of the lumen circumference). This       was biopsied with a cold forceps for histology. Area was successfully       injected with 3 mL of a 1:10,000 solution of epinephrine for hemostasis       of bleeding caused by the procedure. Estimated blood loss was  minimal.       Coagulation for hemostasis of bleeding caused by the procedure using       argon plasma at 0.5 liters/minute and 20 watts was successful.      A small, polypoid, partially circumferential (involving two-thirds of       the lumen circumference) mass with oozing bleeding and stigmata of       recent bleeding was found at the gastroesophageal junction and in the       cardia. Coagulation for hemostasis using argon plasma at 0.5       liters/minute and 25 watts was successful. This was biopsied with a cold       forceps for histology. Estimated blood loss was minimal.       Patchy mild inflammation characterized by congestion (edema) and       erythema was found in the gastric antrum. Biopsies were taken with a       cold forceps for Helicobacter pylori testing.      The examined duodenum was normal. 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                            Biopsied. Injected. Treated with argon plasma                            coagulation (APC) #3.                           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                            Treated with argon plasma coagulation (APC).                            Biopsied #2                           - MILD Gastritis. Biopsied 31 Moderate Sedation:      Per Anesthesia Care Recommendation:           - Await pathology results.                           - Full liquid diet and mechanical soft diet.                           - Continue present medications.                           - Return to my office in 6 months.                           -  Patient has a contact number available for                            emergencies. The signs and symptoms of potential                            delayed complications were discussed with the                            patient. Return to normal activities tomorrow.                            Written discharge instructions were provided to the                            patient. Procedure Code(s):        --- Professional ---                           [phone], 59, Esophagogastroduodenoscopy, flexible,                            transoral; with control of bleeding, any method                           43239, Esophagogastroduodenoscopy, flexible,                            transoral; with biopsy, single or multiple Diagnosis Code(s):        --- Professional ---                           D49.0,  Neoplasm of unspecified behavior of                            digestive system                           K29.70, Gastritis, unspecified, without bleeding                           R13.10, Dysphagia, unspecified                           R63.4, Abnormal weight loss CPT copyright 2016 American Medical Association. All rights reserved. The codes documented in this report are preliminary and upon coder review may  be revised to meet current compliance requirements. Barney Drain, MD Barney Drain, MD 01/16/2017 2:47:05 PM This report has been signed electronically. Number of Addenda: 0

## 2017-01-16 NOTE — H&P (Signed)
Primary Care Physician:  [name], FNP Primary Gastroenterologist:  Dr. Oneida Alar  Pre-Procedure History & Physical: HPI:  Maxwell Henderson is a 51 y.o. male here for DYSPHAGIA/weight loss.  Past Medical History:  Diagnosis Date  . Diabetes mellitus without complication ([place])   . Hypertension     Past Surgical History:  Procedure Laterality Date  . lipoma removal      Prior to Admission medications   Medication Sig Start Date End Date Taking? Authorizing Provider  aspirin EC 81 MG tablet Take 81 mg by mouth daily.   Yes [provider]  loratadine (CLARITIN) 10 MG tablet Take 10 mg by mouth daily.   Yes [provider]  losartan (COZAAR) 25 MG tablet Take 25 mg by mouth daily.   Yes [provider]  metFORMIN (GLUCOPHAGE) 1000 MG tablet Take 1,000 mg by mouth 2 (two) times daily with a meal.   Yes [provider]  Omega-3 Fatty Acids (FISH OIL) 500 MG CAPS Take by mouth daily.   Yes [provider]  pantoprazole (PROTONIX) 40 MG tablet Take 1 tablet (40 mg total) by mouth daily. Take 30 minutes before breakfast 01/05/17  Yes Annitta Needs, NP    Allergies as of 01/10/2017  . (No Known Allergies)    Family History  Problem Relation Age of Onset  . Prostate cancer Father   . Colon cancer Neg Hx   . Colon polyps Neg Hx     Social History   Social History  . Marital status: Married    Spouse name: N/A  . Number of children: N/A  . Years of education: N/A   Occupational History  . Not on file.   Social History Main Topics  . Smoking status: Current Some Day Smoker    Packs/day: 0.25    Types: Cigarettes  . Smokeless tobacco: Never Used  . Alcohol use No     Comment: hx heavy alcohol, hasn't drank in 19 yrs  . Drug use: Yes    Types: Marijuana     Comment: history of marijuana   . Sexual activity: Not on file   Other Topics Concern  . Not on file   Social History Narrative  . No narrative on file    Review of  Systems: See HPI, otherwise negative ROS   Physical Exam: BP (!) 152/103   Pulse 73   Temp 97.9 F (36.6 C) (Oral)   Resp 16   Ht 6\' 1"  (1.854 m)   Wt 210 lb (95.3 kg)   SpO2 99%   BMI 27.71 kg/m  General:   Alert,  pleasant and cooperative in NAD Head:  Normocephalic and atraumatic. Neck:  Supple; Lungs:  Clear throughout to auscultation.    Heart:  Regular rate and rhythm. Abdomen:  Soft, nontender and nondistended. Normal bowel sounds, without guarding, and without rebound.   Neurologic:  Alert and  oriented x4;  grossly normal neurologically.  Impression/Plan:     DYSPHAGIA/weight loss  PLAN:  EGD/DIL TODAY. DISCUSSED PROCEDURE, BENEFITS, & RISKS: < 1% chance of medication reaction, bleeding, or perforation.

## 2017-01-16 NOTE — Telephone Encounter (Signed)
Tried to call and phone is not working right now

## 2017-01-16 NOTE — Anesthesia Postprocedure Evaluation (Signed)
Anesthesia Post Note  Patient: Maxwell Henderson  Procedure(s) Performed: Procedure(s) (LRB): ESOPHAGOGASTRODUODENOSCOPY (EGD) WITH PROPOFOL (N/A) SAVORY DILATION (N/A)  Anesthesia Type: MAC Level of consciousness: awake and alert, oriented and patient cooperative Pain management: pain level controlled Vital Signs Assessment: post-procedure vital signs reviewed and stable Respiratory status: spontaneous breathing and patient connected to nasal cannula oxygen Cardiovascular status: blood pressure returned to baseline and stable Postop Assessment: no headache and no signs of nausea or vomiting Anesthetic complications: no     Last Vitals:  Vitals:   01/16/17 1240 01/16/17 1245  BP: (!) 145/99 (!) 147/100  Pulse:    Resp: 15 (!) 35  Temp:      Last Pain:  Vitals:   01/16/17 1201  TempSrc: Oral                 Ronnell Makarewicz

## 2017-01-16 NOTE — Telephone Encounter (Signed)
Noted  

## 2017-01-16 NOTE — Anesthesia Preprocedure Evaluation (Signed)
Anesthesia Evaluation  Patient identified by MRN, date of birth, ID band Patient awake    Reviewed: Allergy & Precautions, NPO status , Patient's Chart, lab work & pertinent test results  Airway Mallampati: II  TM Distance: >3 FB     Dental  (+) Partial Upper, Teeth Intact   Pulmonary Current Smoker,    breath sounds clear to auscultation       Cardiovascular hypertension,  Rhythm:Regular Rate:Normal     Neuro/Psych    GI/Hepatic GERD (dysphagia)  ,  Endo/Other  diabetes, Well Controlled, Type 2, Oral Hypoglycemic Agents  Renal/GU      Musculoskeletal   Abdominal   Peds  Hematology   Anesthesia Other Findings   Reproductive/Obstetrics                             Anesthesia Physical Anesthesia Plan  ASA: II  Anesthesia Plan: MAC   Post-op Pain Management:    Induction: Intravenous  PONV Risk Score and Plan:   Airway Management Planned: Simple Face Mask  Additional Equipment:   Intra-op Plan:   Post-operative Plan:   Informed Consent: I have reviewed the patients History and Physical, chart, labs and discussed the procedure including the risks, benefits and alternatives for the proposed anesthesia with the patient or authorized representative who has indicated his/her understanding and acceptance.     Plan Discussed with:   Anesthesia Plan Comments:         Anesthesia Quick Evaluation

## 2017-01-16 NOTE — Transfer of Care (Signed)
Immediate Anesthesia Transfer of Care Note  Patient: Maxwell Henderson  Procedure(s) Performed: Procedure(s) with comments: ESOPHAGOGASTRODUODENOSCOPY (EGD) WITH PROPOFOL (N/A) - 730  SAVORY DILATION (N/A)  Patient Location: PACU  Anesthesia Type:MAC  Level of Consciousness: awake, alert , oriented and patient cooperative  Airway & Oxygen Therapy: Patient Spontanous Breathing and Patient connected to nasal cannula oxygen  Post-op Assessment: Report given to RN and Post -op Vital signs reviewed and stable  Post vital signs: Reviewed and stable  Last Vitals:  Vitals:   01/16/17 1240 01/16/17 1245  BP: (!) 145/99 (!) 147/100  Pulse:    Resp: 15 (!) 35  Temp:      Last Pain:  Vitals:   01/16/17 1201  TempSrc: Oral         Complications: No apparent anesthesia complications

## 2017-01-16 NOTE — Telephone Encounter (Signed)
PT NEEDS TO SEE ONCOLOGY JUN 18 AT 0830. FAX RECORDS. PT NEEDS A PET SCAN ASAP, DX: EGJ TUMOR.  FAX EGD REPORT TO KAREN HOUSE, NP-C.

## 2017-01-16 NOTE — Telephone Encounter (Signed)
I faxed [phone]) a copy to the EGD report to Rosann Auerbach, nurse for [employer], NP.

## 2017-01-16 NOTE — Telephone Encounter (Signed)
Pt is set up for PET scan on 01/24/17 @ 1:00. He needs to be there at 12:30 pm at [facility]. NPO 6 hrs. I tried to call patient with no answer.

## 2017-01-16 NOTE — Telephone Encounter (Signed)
Pt called back and he is aware of what time to be at the hospital today

## 2017-01-16 NOTE — Discharge Instructions (Signed)
YOU HAVE A MASS AT in the lower esophagus and upper stomach. You have MILD gastritis DUE TO YOUR USING ASPIRIN.  I biopsied your ESOPHAGUS, UPPER stomach & LOWER STOMACH. YOU HAD SOME BLEEDING AFTER THE BIOPSIES BUT I CAUTERIZED AND INJECTED IT WITH EPINEPHRINE. YOU MAY SEE A SMALL AMOUNT OF BLACK TARRY STOOLS.    RETURN TO WORK JUN 11.  STOP ASPIRIN.  CRUSH YOUR PILLS IF POSSIBLE. CHECK WITH YOUR PHARMACIST.  FOLLOW A SOFT MECHANICAL OR FULL LIQUID DIET. SEE INFO BELOW. MEATS SHOULD BE GROUND. VEGETABLES NEED TO BE SOFT LIKED MASHED POTATOES.  YOUR BIOPSY WILL BE BACK IN 2-3 DAYS.  PLEASE CALL WITH QUESTIONS OR CONCERNS.  COMPLETE PET SCAN IN [place]. SEE ONCOLOGY Jan 29 [address]LO[address] [place].  FOLLOW UP IN 6 MOS.   UPPER ENDOSCOPY AFTER CARE Read the instructions outlined below and refer to this sheet in the next week. These discharge instructions provide you with general information on caring for yourself after you leave the hospital. While your treatment has been planned according to the most current medical practices available, unavoidable complications occasionally occur. If you have any problems or questions after discharge, call DR. Haizley Cannella, [phone].  ACTIVITY  You may resume your regular activity, but move at a slower pace for the next 24 hours.   Take frequent rest periods for the next 24 hours.   Walking will help get rid of the air and reduce the bloated feeling in your belly (abdomen).   No driving for 24 hours (because of the medicine (anesthesia) used during the test).   You may shower.   Do not sign any important legal documents or operate any machinery for 24 hours (because of the anesthesia used during the test).    NUTRITION  Drink plenty of fluids.   You may resume your normal diet as instructed by your doctor.   Begin with a light meal and progress to your normal diet. Heavy or fried foods are harder to digest and may make you feel sick to your  stomach (nauseated).   Avoid alcoholic beverages for 24 hours or as instructed.    MEDICATIONS  You may resume your normal medications.   WHAT YOU CAN EXPECT TODAY  Some feelings of bloating in the abdomen.   Passage of more gas than usual.    IF YOU HAD A BIOPSY TAKEN DURING THE UPPER ENDOSCOPY:  Eat a soft diet IF YOU HAVE NAUSEA, BLOATING, ABDOMINAL PAIN, OR VOMITING.    FINDING OUT THE RESULTS OF YOUR TEST Not all test results are available during your visit. DR. Oneida Alar WILL CALL YOU WITHIN 7 DAYS OF YOUR PROCEDUE WITH YOUR RESULTS. Do not assume everything is normal if you have not heard from DR. Dilara Navarrete IN ONE WEEK, CALL HER OFFICE AT [phone].  SEEK IMMEDIATE MEDICAL ATTENTION AND CALL THE OFFICE: [phone] IF:  You have more than a spotting of blood in your stool.   Your belly is swollen (abdominal distention).   You are nauseated or vomiting.   You have a temperature over 101F.   You have abdominal pain or discomfort that is severe or gets worse throughout the day.   Gastritis  Gastritis is an inflammation (the body's way of reacting to injury and/or infection) of the stomach. It is often caused by bacterial (germ) infections. It can also be caused BY ASPIRIN, BC/GOODY POWDER'S, (IBUPROFEN) MOTRIN, OR ALEVE (NAPROXEN), chemicals (including alcohol), SPICY FOODS, and medications. This illness may be associated with  generalized malaise (feeling tired, not well), UPPER ABDOMINAL STOMACH cramps, and fever. One common bacterial cause of gastritis is an organism known as H. Pylori. This can be treated with antibiotics.     SOFT MECHANICAL DIET This SOFT MECHANICAL DIET is restricted to:  Foods that are moist, soft-textured, and easy to chew and swallow.   Meats that are ground or are minced no larger than one-quarter inch pieces. Meats are moist with gravy or sauce added.   Foods that do not include bread or bread-like textures except soft pancakes,  well-moistened with syrup or sauce.   Textures with some chewing ability required.   Casseroles without rice.   Cooked vegetables that are less than half an inch in size and easily mashed with a fork. No cooked corn, peas, broccoli, cauliflower, cabbage, Brussels sprouts, asparagus, or other fibrous, non-tender or rubbery cooked vegetables.   Canned fruit except for pineapple. Fruit must be cut into pieces no larger than half an inch in size.   Foods that do not include nuts, seeds, coconut, or sticky textures.   OR  Full Liquid Diet A high-calorie, high-protein supplement should be used to meet your nutritional requirements when the full liquid diet is continued for more than 2 or 3 days. If this diet is to be used for an extended period of time (more than 7 days), a multivitamin should be considered.  Breads and Starches  Allowed: None are allowed except crackersWHOLE OR pureed (made into a thick, smooth soup) in soup.   Avoid: Any others.    Potatoes/Pasta/Rice  Allowed: ANY ITEM AS A SOUP OR SMALL PLATE OF MASHED POTATOES OR SCRAMBLED EGGS.       Vegetables  Allowed: Strained tomato or vegetable juice. Vegetables pureed in soup.   Avoid: Any others.    Fruit  Allowed: Any strained fruit juices and fruit drinks. Include 1 serving of citrus or vitamin C-enriched fruit juice daily.   Avoid: Any others.  Meat and Meat Substitutes  Allowed: Egg  Avoid: Any meat, fish, or fowl. All cheese.  Milk  Allowed: SOY Milk beverages, including milk shakes and instant breakfast mixes. Smooth yogurt.   Avoid: Any others. Avoid dairy products if not tolerated.    Soups and Combination Foods  Allowed: Broth, strained cream soups. Strained, broth-based soups.   Avoid: Any others.    Desserts and Sweets  Allowed: flavored gelatin, tapioca, ice cream, sherbet, smooth pudding, junket, fruit ices, frozen ice pops, pudding pops, frozen fudge pops, chocolate syrup. Sugar,  honey, jelly, syrup.   Avoid: Any others.  Fats and Oils  Allowed: Margarine, butter, cream, sour cream, oils.   Avoid: Any others.  Beverages  Allowed: All.   Avoid: None.  Condiments  Allowed: Iodized salt, pepper, spices, flavorings. Cocoa powder.   Avoid: Any others.    SAMPLE MEAL PLAN Breakfast   cup orange juice.   1 OR 2 EGGS  1 cup milk.   1 cup beverage (coffee or tea).   Cream or sugar, if desired.    Midmorning Snack  2 SCRAMBLED OR HARD BOILED EGG   Lunch  1 cup cream soup.    cup fruit juice.   1 cup milk.    cup custard.   1 cup beverage (coffee or tea).   Cream or sugar, if desired.    Midafternoon Snack  1 cup milk shake.  Dinner  1 cup cream soup.    cup fruit juice.   1 cup MILK  cup pudding.   1 cup beverage (coffee or tea).   Cream or sugar, if desired.  Evening Snack  1 cup supplement.  To increase calories, add sugar, cream, butter, or margarine if possible. Nutritional supplements will also increase the total calories.

## 2017-01-17 ENCOUNTER — Telehealth: Payer: Self-pay | Admitting: Gastroenterology

## 2017-01-17 NOTE — Telephone Encounter (Signed)
Called patient TO DISCUSS RESULTS. PT AWARE OF RESULTS. PET SCAN FRI. HE WILL CONTACT [facility] TO ASSIST WITH INSURANCE ERROR.

## 2017-01-17 NOTE — Telephone Encounter (Signed)
Lettter mailed with appointment. He is aware

## 2017-01-17 NOTE — Telephone Encounter (Signed)
I spoke with the patient and made him aware we rescheduled his PET Scan to 01/19/17 to arrive at 7:00 am.    [facility] will mail a new letter with updated appt date and time   Patient stated he only has vision coverage and this was a mistake that his company made, however he is willing to pay for everything out of pocket. mcc

## 2017-01-19 ENCOUNTER — Ambulatory Visit (HOSPITAL_COMMUNITY)
Admission: RE | Admit: 2017-01-19 | Discharge: 2017-01-19 | Disposition: A | Discharge status: Home / Self Care | Payer: Medicaid Other | Source: Ambulatory Visit | Attending: Gastroenterology | Admitting: Gastroenterology

## 2017-01-19 DIAGNOSIS — C159 Malignant neoplasm of esophagus, unspecified: Secondary | ICD-10-CM | POA: Diagnosis present

## 2017-01-19 DIAGNOSIS — C169 Malignant neoplasm of stomach, unspecified: Secondary | ICD-10-CM | POA: Diagnosis not present

## 2017-01-19 LAB — GLUCOSE, CAPILLARY: Glucose-Capillary: 123 mg/dL — ABNORMAL HIGH (ref 65–99)

## 2017-01-19 MED ORDER — FLUDEOXYGLUCOSE F - 18 (FDG) INJECTION
10.2000 | Freq: Once | INTRAVENOUS | Status: AC | PRN
  Administered 2017-01-19: 10.2 via INTRAVENOUS

## 2017-01-23 ENCOUNTER — Telehealth: Payer: Self-pay

## 2017-01-23 ENCOUNTER — Encounter (HOSPITAL_COMMUNITY): Payer: Self-pay | Admitting: Gastroenterology

## 2017-01-23 NOTE — Telephone Encounter (Signed)
PT's wife, [name], called back and asked if Monday was the very earliest appt they could get for pt. She is just very concerned about him. She can be reached at [phone].

## 2017-01-23 NOTE — Telephone Encounter (Signed)
I spoke with Maxwell Henderson at the [facility] and she stated they don't have any availability this week.    Routing to [employer].

## 2017-01-23 NOTE — Telephone Encounter (Signed)
I spoke with Maxwell Henderson and she is aware we have tried and nothing is available before Monday and she is ok.

## 2017-01-23 NOTE — Telephone Encounter (Signed)
I spoke with Seth Bake at [facility] and she stated they don't have anything for this week.  Routing to [employer]

## 2017-01-23 NOTE — Telephone Encounter (Signed)
I went over results with patient and informed him of Oncology appt next Monday. I told him that I could not answer all the questions regarding specific type of treatment he will undergo, but he felt better after knowing the results of the scan. I again stressed the importance of soft foods, which he has been doing. He had no further questions.

## 2017-01-23 NOTE — Telephone Encounter (Signed)
Pt is calling to see about the results from his PET scan.

## 2017-01-24 ENCOUNTER — Encounter (HOSPITAL_COMMUNITY): Payer: Managed Care, Other (non HMO)

## 2017-01-29 ENCOUNTER — Telehealth: Payer: Self-pay

## 2017-01-29 ENCOUNTER — Encounter (HOSPITAL_COMMUNITY): Payer: Self-pay

## 2017-01-29 ENCOUNTER — Encounter (HOSPITAL_COMMUNITY): Payer: Self-pay | Attending: Oncology | Admitting: Oncology

## 2017-01-29 DIAGNOSIS — C160 Malignant neoplasm of cardia: Secondary | ICD-10-CM

## 2017-01-29 DIAGNOSIS — Z720 Tobacco use: Secondary | ICD-10-CM

## 2017-01-29 DIAGNOSIS — R634 Abnormal weight loss: Secondary | ICD-10-CM

## 2017-01-29 DIAGNOSIS — R599 Enlarged lymph nodes, unspecified: Secondary | ICD-10-CM

## 2017-01-29 DIAGNOSIS — R1310 Dysphagia, unspecified: Secondary | ICD-10-CM

## 2017-01-29 DIAGNOSIS — C155 Malignant neoplasm of lower third of esophagus: Secondary | ICD-10-CM

## 2017-01-29 DIAGNOSIS — C159 Malignant neoplasm of esophagus, unspecified: Secondary | ICD-10-CM | POA: Insufficient documentation

## 2017-01-29 HISTORY — DX: Malignant neoplasm of esophagus, unspecified: C15.9

## 2017-01-29 NOTE — Telephone Encounter (Signed)
I spoke with the patient's wife and she stated they have started scheduling his tests that the oncologist ordered.  I told her that if she needed anything to give [place] a call.  She thanked me for the call and disconnected the line.

## 2017-01-29 NOTE — Patient Instructions (Signed)
[facility] at [facility] Discharge Instructions  RECOMMENDATIONS MADE BY THE CONSULTANT AND ANY TEST RESULTS WILL BE SENT TO YOUR REFERRING PHYSICIAN.  You were seen today by Dr. Zhou.   Thank you for choosing [facility] at [facility] to provide your oncology and hematology care.  To afford each patient quality time with our provider, please arrive at least 15 minutes before your scheduled appointment time.    If you have a lab appointment with the Cancer Center please come in thru the  Main Entrance and check in at the main information desk  You need to re-schedule your appointment should you arrive 10 or more minutes late.  We strive to give you quality time with our providers, and arriving late affects you and other patients whose appointments are after yours.  Also, if you no show three or more times for appointments you may be dismissed from the clinic at the providers discretion.     Again, thank you for choosing [facility].  Our hope is that these requests will decrease the amount of time that you wait before being seen by our physicians.       _____________________________________________________________  Should you have questions after your visit to [facility], please contact our office at [phone] between the hours of 8:30 a.m. and 4:30 p.m.  Voicemails left after 4:30 p.m. will not be returned until the following business day.  For prescription refill requests, have your pharmacy contact our office.       Resources For Cancer Patients and their Caregivers ? American Cancer Society: Can assist with transportation, wigs, general needs, runs Look Good Feel Better.        1-888-227-6333 ? Cancer Care: Provides financial assistance, online support groups, medication/co-pay assistance.  1-800-813-HOPE (4673) ? [facility] Assists [place] cancer patients and their families  through emotional , educational and financial support.  336-427-4357 ? [place] DSS Where to apply for food stamps, Medicaid and utility assistance. 336-342-1394 ? [facility]: Transportation to medical appointments. 336-347-2287 ? Social Security Administration: May apply for disability if have a Stage IV cancer. 336-342-7796 1-800-772-1213 ? [place] Aging, Disability and Transit Services: Assists with nutrition, care and transit needs. 336-349-2343  Cancer Center Support Programs: @[email or web address]@ > Cancer Support Group  2nd Tuesday of the month 1pm-2pm, Journey Room  > Creative Journey  3rd Tuesday of the month 1130am-1pm, Journey Room  > Look Good Feel Better  1st Wednesday of the month 10am-12 noon, Journey Room (Call American Cancer Society to register 1-800-395-5775)    

## 2017-01-29 NOTE — Progress Notes (Addendum)
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  Patient was initially having dysphagia for the past 2 months with unintentional weight loss of about 15 pounds past 2 months.  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 Barney Drain. Partially obstructing, likely malignant esophageal tumor was found in the distal esophagus. Biopsied with surgical path demonstrating Adenocarcinoma. Injected. Treated with argon plasma coagulation (APC) #3. 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 Biopsied with surgical path demonstrating Adenocarcinoma.      01/19/2017 PET scan    NECK Two FDG avid nodes are seen in the base of the neck on the right. The first is seen on CT image 35, just posterior to the right thyroid lobe and the other is seen on CT image 41 measuring 16 mm in short axis with a maximum SUV of 8.6.  CHEST  The patient's known malignancy involving the distal esophagus and cardia of the stomach is FDG avid with a maximum SUV of 10.4. No definitive metastatic nodes within the chest.  ABDOMEN/PELVIS  FDG avid metastatic adenopathy is seen in the gastrohepatic ligament, paraceliac nodes, posterior to the IVC on image 111, and to the left of the SMA on image 112. The most inferior node is seen in the aortocaval region on image 117. The representative node to the left of the SMA was measured on series 4, image  111 measuring 2.5 by 2.1 cm with a maximum SUV of 7. No other FDG avid disease in the abdomen or pelvis. Cholelithiasis is identified.      Today the patient presented with his wife. He states that he has dysphagia to solids and he has been eating soft pured foods. He has no dysphagia for liquids. He has intermittent epigastric pain with no alleviating or exacerbating factors. He denies any chest pain, shortness breath, abdominal pain, focal weakness.  Review of Systems  Constitutional: Positive for unexpected weight change. Negative for appetite change, chills, fatigue and fever.  HENT:   Negative for hearing loss, lump/mass, mouth sores, sore throat and tinnitus.   Eyes: Negative for eye problems and icterus.  Respiratory: Negative for chest tightness, cough, hemoptysis, shortness of breath and wheezing.   Cardiovascular: Negative for chest pain, leg swelling and palpitations.  Gastrointestinal: Negative for abdominal distention, abdominal pain, blood in stool, diarrhea, nausea and vomiting.       Dysphagia Epigastric pain  Endocrine: Negative.  Negative for hot flashes.  Genitourinary: Negative for difficulty urinating, frequency and hematuria.   Musculoskeletal: Negative for arthralgias and neck pain.  Skin: Negative for itching and rash.  Neurological: Negative for dizziness, headaches and speech difficulty.  Hematological: Negative for adenopathy. Does not bruise/bleed easily.  Psychiatric/Behavioral: Negative for confusion. The patient is not nervous/anxious.     MEDICAL HISTORY: Past Medical History:  Diagnosis Date  . Diabetes mellitus without complication ([place])   . Hypertension     SURGICAL HISTORY: Past Surgical History:  Procedure Laterality Date  .  ESOPHAGOGASTRODUODENOSCOPY (EGD) WITH PROPOFOL N/A 01/16/2017   Procedure: ESOPHAGOGASTRODUODENOSCOPY (EGD) WITH PROPOFOL;  Surgeon: [name], MD;  [facility];  Service: Endoscopy;  Laterality: N/A;   730   . lipoma removal    . SAVORY DILATION N/A 01/16/2017   Procedure: SAVORY DILATION;  Surgeon: [name], MD;  Location: [facility];  Service: Endoscopy;  Laterality: N/A;    SOCIAL HISTORY: Social History   Social History  . Marital status: Married    Spouse name: N/A  . Number of children: N/A  . Years of education: N/A   Occupational History  . Not on file.   Social History Main Topics  . Smoking status: Current Some Day Smoker    Packs/day: 0.25    Types: Cigarettes  . Smokeless tobacco: Never Used  . Alcohol use No     Comment: hx heavy alcohol, hasn't drank in 19 yrs  . Drug use: Yes    Types: Marijuana     Comment: history of marijuana   . Sexual activity: Not on file   Other Topics Concern  . Not on file   Social History Narrative  . No narrative on file    FAMILY HISTORY Family History  Problem Relation Age of Onset  . Prostate cancer Father   . Colon cancer Neg Hx   . Colon polyps Neg Hx     ALLERGIES:  has No Known Allergies.  MEDICATIONS:  Current Outpatient Prescriptions  Medication Sig Dispense Refill  . losartan (COZAAR) 25 MG tablet Take 25 mg by mouth daily.    . pantoprazole (PROTONIX) 40 MG tablet Take 1 tablet (40 mg total) by mouth daily. Take 30 minutes before breakfast 90 tablet 3  . loratadine (CLARITIN) 10 MG tablet Take 10 mg by mouth daily.    . metFORMIN (GLUCOPHAGE) 1000 MG tablet Take 1,000 mg by mouth 2 (two) times daily with a meal.    . Omega-3 Fatty Acids (FISH OIL) 500 MG CAPS Take by mouth daily.     No current facility-administered medications for this visit.     PHYSICAL EXAMINATION:  ECOG PERFORMANCE STATUS: 1 - Symptomatic but completely ambulatory   Vitals:   01/29/17 0842  BP: (!) 137/96  Pulse: 74  Resp: 16    Filed Weights   01/29/17 0842  Weight: 209 lb (94.8 kg)     Physical Exam  Constitutional: He is oriented to person, place, and time and well-developed, well-nourished, and in no  distress. No distress.  HENT:  Head: Normocephalic and atraumatic.  Mouth/Throat: No oropharyngeal exudate.  Eyes: Conjunctivae are normal. Pupils are equal, round, and reactive to light. No scleral icterus.  Neck: Normal range of motion. Neck supple. No JVD present.  Cardiovascular: Normal rate, regular rhythm and normal heart sounds.  Exam reveals no gallop and no friction rub.   No murmur heard. Pulmonary/Chest: Breath sounds normal. No respiratory distress. He has no wheezes. He has no rales.  Abdominal: Soft. Bowel sounds are normal. He exhibits no distension. There is tenderness (over epigastrium). There is no guarding.  Musculoskeletal: He exhibits no edema or tenderness.  Lymphadenopathy:    He has no cervical adenopathy.  Neurological: He is alert and oriented to person, place, and time. No cranial nerve deficit.  Skin: Skin is warm and dry. No rash noted. No erythema. No pallor.  Psychiatric: Affect and judgment normal.     LABORATORY DATA: I have personally reviewed the data as listed:  Hospital Outpatient Visit on 01/19/2017  Component Date Value Ref Range Status  . Glucose-Capillary 01/19/2017 123* 65 - 99 mg/dL Final  Admission on 01/16/2017, Discharged on 01/16/2017  Component Date Value Ref Range Status  . Glucose-Capillary 01/16/2017 88  65 - 99 mg/dL Final  Hospital Outpatient Visit on 01/15/2017  Component Date Value Ref Range Status  . WBC 01/15/2017 6.1  4.0 - 10.5 K/uL Final  . RBC 01/15/2017 4.53  4.22 - 5.81 MIL/uL Final  . Hemoglobin 01/15/2017 9.5* 13.0 - 17.0 g/dL Final  . HCT 01/15/2017 29.7* 39.0 - 52.0 % Final  . MCV 01/15/2017 65.6* 78.0 - 100.0 fL Final  . MCH 01/15/2017 21.0* 26.0 - 34.0 pg Final  . MCHC 01/15/2017 32.0  30.0 - 36.0 g/dL Final  . RDW 01/15/2017 15.4  11.5 - 15.5 % Final  . Platelets 01/15/2017 348  150 - 400 K/uL Final  . Neutrophils Relative % 01/15/2017 51  % Final  . Neutro Abs 01/15/2017 3.1  1.7 - 7.7 K/uL Final  .  Lymphocytes Relative 01/15/2017 39  % Final  . Lymphs Abs 01/15/2017 2.4  0.7 - 4.0 K/uL Final  . Monocytes Relative 01/15/2017 9  % Final  . Monocytes Absolute 01/15/2017 0.5  0.1 - 1.0 K/uL Final  . Eosinophils Relative 01/15/2017 2  % Final  . Eosinophils Absolute 01/15/2017 0.1  0.0 - 0.7 K/uL Final  . Basophils Relative 01/15/2017 0  % Final  . Basophils Absolute 01/15/2017 0.0  0.0 - 0.1 K/uL Final  . Sodium 01/15/2017 138  135 - 145 mmol/L Final  . Potassium 01/15/2017 3.7  3.5 - 5.1 mmol/L Final  . Chloride 01/15/2017 105  101 - 111 mmol/L Final  . CO2 01/15/2017 26  22 - 32 mmol/L Final  . Glucose, Bld 01/15/2017 129* 65 - 99 mg/dL Final  . BUN 01/15/2017 8  6 - 20 mg/dL Final  . Creatinine, Ser 01/15/2017 1.10  0.61 - 1.24 mg/dL Final  . Calcium 01/15/2017 8.7* 8.9 - 10.3 mg/dL Final  . GFR calc non Af Amer 01/15/2017 >60  >60 mL/min Final  . GFR calc Af Amer 01/15/2017 >60  >60 mL/min Final   Comment: (NOTE) The eGFR has been calculated using the CKD EPI equation. This calculation has not been validated in all clinical situations. eGFR's persistently <60 mL/min signify possible Chronic Kidney Disease.   . Anion gap 01/15/2017 7  5 - 15 Final    RADIOGRAPHIC STUDIES: I have personally reviewed the radiological images as listed and agree with the findings in the report  No results found.  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 I am concerned that he may have metastatic disease to the lymph nodes in his right neck. No lymph nodes were palpable on exam.  I will refer him to IR for stat ultrasound guided biopsy of the 2 right neck lymph nodes for confirmation of metastatic disease. If he does have metastatic disease I will send for HER2 testing on his tissue specimen. I will also send him to Dr. Ardis Hughs for an EUS for staging purposes.  RTC in 2-3 weeks to discuss  stage and treatment plan.   Orders Placed This Encounter  Procedures  . US Guided Needle Placement    Standing Status:   Future    Standing Expiration Date:   03/31/2018    Order Specific Question:  Reason for Exam (SYMPTOM  OR DIAGNOSIS REQUIRED)    Answer:   GE junction esophageal adenocarcinoma, US guided biopsy of right neck lymph nodes seen on PET to confirm metastatic disease    Order Specific Question:   Preferred imaging location?    Answer:   [facility]    All questions were answered. The patient knows to call the clinic with any problems, questions or concerns.  This note was electronically signed.    Twana First, MD  01/29/2017 11:51 AM   ADDENDUM: Patient underwent biopsy of supraclavicular lymph node on 02/02/17 which demonstrated metastatic poorly differentiated carcinoma. 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 I will send out for HER-2 testing, I have called pathology. Stat IR consult for chemotherapy port placement which she will have placed on 02/13/17. Plan to start palliative chemotherapy with FOLFOX Q14 days on 02/15/17. If HER-2 testing is positive, we'll add Herceptin to his treatment plan. Follow up with [place] in clinic as scheduled on 02/12/17.  Twana First, MD 02/07/17 2:48 PM

## 2017-01-29 NOTE — Telephone Encounter (Signed)
Pt's wife called because she is not satisfied with the [place] and she is wanting to know what she need to do about getting a second option. I told her that I would get my office manger to call her. She said that she thinks that the [place] is moving to slow.

## 2017-01-30 ENCOUNTER — Other Ambulatory Visit (HOSPITAL_COMMUNITY): Payer: Managed Care, Other (non HMO)

## 2017-01-30 ENCOUNTER — Telehealth (HOSPITAL_COMMUNITY): Payer: Self-pay

## 2017-01-30 ENCOUNTER — Other Ambulatory Visit (HOSPITAL_COMMUNITY): Payer: Self-pay | Admitting: Oncology

## 2017-01-30 MED ORDER — MORPHINE SULFATE (CONCENTRATE) 10 MG /0.5 ML PO SOLN: 10.0000 mg | ORAL | 0 refills | Status: DC | PRN

## 2017-01-30 NOTE — Telephone Encounter (Signed)
Patients wife called stating patient was in terrible pain last night and this morning. Patient cannot swallow pills and needs liquid pain meds. Explained to wife I would talk with the MD and call her back.[phone])

## 2017-01-31 ENCOUNTER — Other Ambulatory Visit: Payer: Self-pay | Admitting: Radiology

## 2017-02-01 ENCOUNTER — Other Ambulatory Visit: Payer: Self-pay | Admitting: General Surgery

## 2017-02-01 ENCOUNTER — Other Ambulatory Visit: Payer: Self-pay | Admitting: Radiology

## 2017-02-02 ENCOUNTER — Encounter (HOSPITAL_COMMUNITY): Payer: Self-pay

## 2017-02-02 ENCOUNTER — Ambulatory Visit (HOSPITAL_COMMUNITY)
Admission: RE | Admit: 2017-02-02 | Discharge: 2017-02-02 | Disposition: A | Discharge status: Home / Self Care | Payer: Medicaid Other | Source: Ambulatory Visit | Attending: Oncology | Admitting: Oncology

## 2017-02-02 ENCOUNTER — Other Ambulatory Visit (HOSPITAL_COMMUNITY): Payer: Self-pay | Admitting: Oncology

## 2017-02-02 DIAGNOSIS — R590 Localized enlarged lymph nodes: Secondary | ICD-10-CM | POA: Insufficient documentation

## 2017-02-02 DIAGNOSIS — Z8042 Family history of malignant neoplasm of prostate: Secondary | ICD-10-CM | POA: Insufficient documentation

## 2017-02-02 DIAGNOSIS — C770 Secondary and unspecified malignant neoplasm of lymph nodes of head, face and neck: Secondary | ICD-10-CM | POA: Insufficient documentation

## 2017-02-02 DIAGNOSIS — F17210 Nicotine dependence, cigarettes, uncomplicated: Secondary | ICD-10-CM | POA: Diagnosis not present

## 2017-02-02 DIAGNOSIS — E119 Type 2 diabetes mellitus without complications: Secondary | ICD-10-CM | POA: Diagnosis not present

## 2017-02-02 DIAGNOSIS — Z7984 Long term (current) use of oral hypoglycemic drugs: Secondary | ICD-10-CM | POA: Diagnosis not present

## 2017-02-02 DIAGNOSIS — C159 Malignant neoplasm of esophagus, unspecified: Secondary | ICD-10-CM

## 2017-02-02 DIAGNOSIS — R1310 Dysphagia, unspecified: Secondary | ICD-10-CM | POA: Diagnosis not present

## 2017-02-02 DIAGNOSIS — Z9889 Other specified postprocedural states: Secondary | ICD-10-CM | POA: Diagnosis not present

## 2017-02-02 DIAGNOSIS — C169 Malignant neoplasm of stomach, unspecified: Secondary | ICD-10-CM | POA: Diagnosis not present

## 2017-02-02 DIAGNOSIS — Z79899 Other long term (current) drug therapy: Secondary | ICD-10-CM | POA: Insufficient documentation

## 2017-02-02 DIAGNOSIS — I10 Essential (primary) hypertension: Secondary | ICD-10-CM | POA: Insufficient documentation

## 2017-02-02 LAB — PROTIME-INR
INR: 1.07
Prothrombin Time: 13.9 seconds (ref 11.4–15.2)

## 2017-02-02 LAB — GLUCOSE, CAPILLARY
GLUCOSE-CAPILLARY: 75 mg/dL (ref 65–99)
GLUCOSE-CAPILLARY: 85 mg/dL (ref 65–99)

## 2017-02-02 LAB — CBC
HEMATOCRIT: 32.1 % — AB (ref 39.0–52.0)
HEMOGLOBIN: 9.9 g/dL — AB (ref 13.0–17.0)
MCH: 19.7 pg — ABNORMAL LOW (ref 26.0–34.0)
MCHC: 30.8 g/dL (ref 30.0–36.0)
MCV: 63.9 fL — ABNORMAL LOW (ref 78.0–100.0)
Platelets: 376 10*3/uL (ref 150–400)
RBC: 5.02 MIL/uL (ref 4.22–5.81)
RDW: 15.4 % (ref 11.5–15.5)
WBC: 5.9 10*3/uL (ref 4.0–10.5)

## 2017-02-02 LAB — APTT: APTT: 31 s (ref 24–36)

## 2017-02-02 MED ORDER — SODIUM CHLORIDE 0.9 % IV SOLN: INTRAVENOUS | Status: DC

## 2017-02-02 MED ORDER — LIDOCAINE HCL (PF) 1 % IJ SOLN
INTRAMUSCULAR | Status: AC
  Filled 2017-02-02: qty 30

## 2017-02-02 MED ORDER — FENTANYL CITRATE (PF) 100 MCG/2ML IJ SOLN
INTRAMUSCULAR | Status: AC | PRN
  Administered 2017-02-02: 50 ug via INTRAVENOUS

## 2017-02-02 MED ORDER — MIDAZOLAM HCL 2 MG/2ML IJ SOLN
INTRAMUSCULAR | Status: AC | PRN
  Administered 2017-02-02: 1 mg via INTRAVENOUS

## 2017-02-02 MED ORDER — FENTANYL CITRATE (PF) 100 MCG/2ML IJ SOLN
INTRAMUSCULAR | Status: AC
  Filled 2017-02-02: qty 2

## 2017-02-02 MED ORDER — MIDAZOLAM HCL 2 MG/2ML IJ SOLN
INTRAMUSCULAR | Status: AC
  Filled 2017-02-02: qty 2

## 2017-02-02 NOTE — H&P (Signed)
Chief Complaint: Patient was seen in consultation today for right neck lymph node biopsy at the request of Zhou,Louise  Referring Physician(s): Zhou,Louise  Supervising Physician: Daryll Brod  Patient Status: [facility] - Out-pt  History of Present Illness: Maxwell Henderson is a 51 y.o. male   Dysphagia Wt loss  Dx esophageal adenocarcinoma EGD by Dr. Barney Drain. Partially obstructing, likely malignant esophageal tumor was found in the distal esophagus. Biopsied with surgical path demonstrating Adenocarcinoma. Injected. Treated with argon plasma coagulation (APC) #3. 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 Biopsied with surgical path demonstrating Adenocarcinoma.  01/19/2017: PET IMPRESSION: 1. The patient's primary malignancy involving the distal esophagus and gastric cardia is FDG avid with a maximum SUV of 10.4. FDG avid nodes in the base of the neck on the right are most consistent with metastatic disease. There are metastatic nodes in the upper abdomen as described above.  Now scheduled for neck LN biopsy for confirmation of possible metastatic disease --per Dr Talbert Cage   Past Medical History:  Diagnosis Date  . Diabetes mellitus without complication ([place])   . Hypertension     Past Surgical History:  Procedure Laterality Date  . ESOPHAGOGASTRODUODENOSCOPY (EGD) WITH PROPOFOL N/A 01/16/2017   Procedure: ESOPHAGOGASTRODUODENOSCOPY (EGD) WITH PROPOFOL;  Surgeon: [name], MD;  [facility];  Service: Endoscopy;  Laterality: N/A;  730   . lipoma removal    . SAVORY DILATION N/A 01/16/2017   Procedure: SAVORY DILATION;  Surgeon: [name], MD;  [facility];  Service: Endoscopy;  Laterality: N/A;    Allergies: Patient has no known allergies.  Medications: Prior to Admission medications   Medication Sig Start Date End Date Taking? Authorizing Provider  loratadine (CLARITIN) 10 MG tablet Take 10 mg by mouth  daily.   Yes [provider]  losartan (COZAAR) 25 MG tablet Take 25 mg by mouth daily.   Yes [provider]  metFORMIN (GLUCOPHAGE) 500 MG tablet Take 500 mg by mouth daily.   Yes [provider]  Omega-3 Fatty Acids (FISH OIL) 500 MG CAPS Take 500 mg by mouth daily.    Yes [provider]  pantoprazole (PROTONIX) 40 MG tablet Take 1 tablet (40 mg total) by mouth daily. Take 30 minutes before breakfast 01/05/17  Yes Annitta Needs, NP  Morphine Sulfate (MORPHINE CONCENTRATE) 10 mg / 0.5 ml concentrated solution Take 0.5 mLs (10 mg total) by mouth every 4 (four) hours as needed for severe pain. 01/30/17   Twana First, MD     Family History  Problem Relation Age of Onset  . Prostate cancer Father   . Colon cancer Neg Hx   . Colon polyps Neg Hx     Social History   Social History  . Marital status: Married    Spouse name: N/A  . Number of children: N/A  . Years of education: N/A   Social History Main Topics  . Smoking status: Current Some Day Smoker    Packs/day: 0.25    Types: Cigarettes  . Smokeless tobacco: Never Used  . Alcohol use No     Comment: hx heavy alcohol, hasn't drank in 19 yrs  . Drug use: Yes    Types: Marijuana     Comment: history of marijuana   . Sexual activity: Not Asked   Other Topics Concern  . None   Social History Narrative  . None    Review of Systems: A 12 point  ROS discussed and pertinent positives are indicated in the HPI above.  All other systems are negative.  Review of Systems  Constitutional: Positive for appetite change, fatigue and unexpected weight change. Negative for activity change and fever.  HENT: Positive for trouble swallowing.   Respiratory: Negative for cough and shortness of breath.   Cardiovascular: Negative for chest pain.  Gastrointestinal: Negative for abdominal pain.  Neurological: Negative for weakness.  Psychiatric/Behavioral: Negative for behavioral problems and confusion.     Vital Signs: BP 135/89   Pulse 87   Temp 98.8 F (37.1 C) (Oral)   Resp 18   Ht 6\' 1"  (1.854 m)   Wt 209 lb (94.8 kg)   SpO2 100%   BMI 27.57 kg/m   Physical Exam  Constitutional: He is oriented to person, place, and time.  Cardiovascular: Normal rate and regular rhythm.   Pulmonary/Chest: Effort normal and breath sounds normal.  Abdominal: Soft. Bowel sounds are normal.  Musculoskeletal: Normal range of motion.  Neurological: He is alert and oriented to person, place, and time.  Skin: Skin is warm and dry.  Psychiatric: He has a normal mood and affect. His behavior is normal. Judgment and thought content normal.  Nursing note and vitals reviewed.   Mallampati Score:  MD Evaluation Airway: WNL Heart: WNL Abdomen: WNL Chest/ Lungs: WNL ASA  Classification: 3 Mallampati/Airway Score: One  Imaging: Nm Pet Image Initial (pi) Skull Base To Thigh  Result Date: 01/19/2017 CLINICAL DATA:  Initial treatment strategy for adenocarcinoma of the esophagus and stomach. EXAM: NUCLEAR MEDICINE PET SKULL BASE TO THIGH TECHNIQUE: 10.2 mCi F-18 FDG was injected intravenously. Full-ring PET imaging was performed from the skull base to thigh after the radiotracer. CT data was obtained and used for attenuation correction and anatomic localization. FASTING BLOOD GLUCOSE:  Value: 123 mg/dl COMPARISON:  None. FINDINGS: NECK Two FDG avid nodes are seen in the base of the neck on the right. The first is seen on CT image 35, just posterior to the right thyroid lobe and the other is seen on CT image 41 measuring 16 mm in short axis with a maximum SUV of 8.6. CHEST The patient's known malignancy involving the distal esophagus and cardia of the stomach is FDG avid with a maximum SUV of 10.4. No definitive metastatic nodes within the chest. ABDOMEN/PELVIS FDG avid metastatic adenopathy is seen in the gastrohepatic ligament, paraceliac nodes, posterior to the IVC on image 111, and to the left of the SMA on  image 112. The most inferior node is seen in the aortocaval region on image 117. The representative node to the left of the SMA was measured on series 4, image 111 measuring 2.5 by 2.1 cm with a maximum SUV of 7. No other FDG avid disease in the abdomen or pelvis. Cholelithiasis is identified. SKELETON No focal hypermetabolic activity to suggest skeletal metastasis. IMPRESSION: 1. The patient's primary malignancy involving the distal esophagus and gastric cardia is FDG avid with a maximum SUV of 10.4. FDG avid nodes in the base of the neck on the right are most consistent with metastatic disease. There are metastatic nodes in the upper abdomen as described above. Electronically Signed   By: Dorise Bullion III M.D   On: 01/19/2017 09:36    Labs:  CBC:  Recent Labs  01/15/17 1820 02/02/17 1121  WBC 6.1 5.9  HGB 9.5* 9.9*  HCT 29.7* 32.1*  PLT 348 376    COAGS:  Recent Labs  02/02/17 1121  INR  1.07  APTT 31    BMP:  Recent Labs  01/15/17 1820  NA 138  K 3.7  CL 105  CO2 26  GLUCOSE 129*  BUN 8  CALCIUM 8.7*  CREATININE 1.10  GFRNONAA >60  GFRAA >60    LIVER FUNCTION TESTS: No results for input(s): BILITOT, AST, ALT, ALKPHOS, PROT, ALBUMIN in the last 8760 hours.  TUMOR MARKERS: No results for input(s): AFPTM, CEA, CA199, CHROMGRNA in the last 8760 hours.  Assessment and Plan:  New dx esophageal/gastric cancer +PET Right neck Lymphadenopathy ---now for biopsy to determine metastatic disease prior to treatment Risks and Benefits discussed with the patient including, but not limited to bleeding, infection, damage to adjacent structures or low yield requiring additional tests. All of the patient's questions were answered, patient is agreeable to proceed. Consent signed and in chart.  Thank you for this interesting consult.  I greatly enjoyed meeting Maxwell Henderson and look forward to participating in their care.  A copy of this report was sent to the requesting  provider on this date.  Electronically Signed: Lavonia Drafts, PA-C 02/02/2017, 12:23 PM   I spent a total of  30 Minutes   in face to face in clinical consultation, greater than 50% of which was counseling/coordinating care for right neck lymph node biopsy

## 2017-02-02 NOTE — Sedation Documentation (Signed)
Patient is resting comfortably. 

## 2017-02-02 NOTE — Procedures (Signed)
Interventional Radiology Procedure Note  Procedure: US guided bx of right supraclavicular LN.   Complications: None  Estimated Blood Loss: None  Recommendations:  - DC home  Signed,  Criselda Peaches, MD

## 2017-02-02 NOTE — Sedation Documentation (Signed)
Recover 30 min and D/C home

## 2017-02-02 NOTE — Sedation Documentation (Signed)
Bandaid R neck

## 2017-02-02 NOTE — Progress Notes (Signed)
Pt was discharged to home with wife, VS stable. IV removed. Bandaid intact to R neck. Instructions to place ice pack to neck if has soreness. No questions or concerns.

## 2017-02-07 ENCOUNTER — Other Ambulatory Visit (HOSPITAL_COMMUNITY): Payer: Self-pay | Admitting: Oncology

## 2017-02-07 ENCOUNTER — Telehealth (HOSPITAL_COMMUNITY): Payer: Self-pay | Admitting: Emergency Medicine

## 2017-02-07 ENCOUNTER — Other Ambulatory Visit (HOSPITAL_COMMUNITY): Payer: Self-pay | Admitting: *Deleted

## 2017-02-07 DIAGNOSIS — C159 Malignant neoplasm of esophagus, unspecified: Secondary | ICD-10-CM

## 2017-02-07 MED ORDER — PROCHLORPERAZINE MALEATE 10 MG PO TABS: 10.0000 mg | ORAL_TABLET | Freq: Four times a day (QID) | ORAL | 1 refills | Status: DC | PRN

## 2017-02-07 MED ORDER — DEXAMETHASONE 4 MG PO TABS: 8.0000 mg | ORAL_TABLET | Freq: Every day | ORAL | 1 refills | Status: DC

## 2017-02-07 MED ORDER — ONDANSETRON HCL 8 MG PO TABS: 8.0000 mg | ORAL_TABLET | Freq: Two times a day (BID) | ORAL | 1 refills | Status: DC | PRN

## 2017-02-07 MED ORDER — LIDOCAINE-PRILOCAINE 2.5-2.5 % EX CREA: TOPICAL_CREAM | CUTANEOUS | 3 refills | Status: DC

## 2017-02-07 NOTE — Telephone Encounter (Signed)
Called pts wife to let her know I would teach the pt on Monday when he comes in for the follow up appt.  She verbalized understanding.

## 2017-02-08 ENCOUNTER — Encounter (HOSPITAL_COMMUNITY): Payer: Self-pay | Admitting: Emergency Medicine

## 2017-02-08 NOTE — Patient Instructions (Signed)
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  We are going to treat you with the regimen FOLFOX.  The treatment includes oxaliplatin, leucovorin, and 66fu.  The 62fu is what you will take home in your infusion pump. You will have a pump that you take home home for 46 hours.  This treatment is with palliative intent, which means you are treatable, but not curable.  You will see the doctor regularly throughout treatment.  We monitor your lab work prior to every treatment.  The doctor monitors your response to treatment by the way you are feeling, your blood work, and scans periodically.  You will have wait times while you are here for your treatment.  It can take about 30 minutes to 1 hour for your blood work to result.  There is wait time while pharmacy mixes your medications.     You will have pre-medications that you receive prior to each chemotherapy: Premeds: Aloxi - high powered nausea/vomiting prevention medication used for chemotherapy patients.  Dexamethasone - steroid - given to reduce the risk of you having an allergic type reaction to the chemotherapy. Dex can cause you to feel energized, nervous/anxious/jittery, make you have trouble sleeping, and/or make you feel hot/flushed in the face/neck and/or look pink/red in the face/neck. These side effects will pass as the Dex wears off. (takes 20 minutes to infuse)   POTENTIAL SIDE EFFECTS OF TREATMENT:  5-Fluorouracil (Adrucil)  About This Drug Fluorouracil is used to treat cancer. It is given in the vein (IV).  Possible Side Effects . Hair loss. Hair loss is often temporary, although with certain medicine, hair loss can sometimes be permanent. Hair loss may happen suddenly or gradually. If you lose hair, you may lose it from your head, face, armpits, pubic area, chest, and/or legs. You may also notice your hair getting thin. . Changes in your nail color, nail loss  and/or brittle nail . Darkening of the skin, or changes to the color of your skin and/or veins used for infusion . Rash, itching . Nausea and throwing up (vomiting) . Loose bowel movements (diarrhea) . Ulcers - sores that may cause pain or bleeding in your digestive tract, which includes your mouth, esophagus, stomach, small/large intestines and rectum . Soreness of the mouth and throat. You may have red areas, white patches, or sores that hurt. . Decreased appetite (decreased hunger) . Changes in the tissue of the heart and/or heart attack. Some changes may happen that can cause your heart to have less ability to pump blood. . Bone marrow depression. This is a decrease in the number of white blood cells, red blood cells, and platelets. This may raise your risk of infection, make you tired and weak (fatigue), and raise your risk of bleeding . Sensitivity to light (photosensitivity). Photosensitivity means that you may become more sensitive to the sun and/or light. Your eyes may water more, mostly in bright light. . Allergic reaction: Allergic reactions, including anaphylaxis are rare but may happen in some patients. Signs of allergic reaction to this drug may be swelling of the face, feeling like your tongue or throat are swelling, trouble breathing, rash, itching, fever, chills, feeling dizzy, and/or feeling that your heart is beating in a fast or not normal way. If this happens, do not take another dose of this drug. You should get urgent medical treatment. . Blurred vision or other changes in eyesight Note: Not all possible side effects are  included above.  Warnings and Precautions . Hand-and-foot syndrome. The palms of your hands or soles of your feet may tingle, become numb, painful, swollen, or red. . Changes in your central nervous system can happen. The central nervous system is made up of your brain and spinal cord. You could feel extreme tiredness, agitation, confusion,  hallucinations (see or hear things that are not there), trouble understanding or speaking, loss of control of your bowels or bladder, eyesight changes, numbness or lack of strength to your arms, legs, face, or body, and coma. If you start to have any of these symptoms let your doctor know right away. . Side effects of this drug may be unexpectedly severe in some patients Note: Some of the side effects above are very rare. If you have concerns and/or questions, please discuss them with your medical team.  Important Information . This drug may be present in the saliva, tears, sweat, urine, stool, vomit, semen, and vaginal secretions. Talk to your doctor and/or your nurse about the necessary precautions to take during this time.  Treating Side Effects . To help with hair loss, wash with a mild shampoo and avoid washing your hair every day. . Avoid rubbing your scalp, pat your hair or scalp dry. . Avoid coloring your hair. . Limit your use of hair spray, electric curlers, blow dryers, and curling irons. . If you are interested in getting a wig, talk to your nurse. You can also call the [facility] at 800-ACS-2345 to find out information about the "Look Good, Feel Better" program close to where you live. It is a free program where women getting chemotherapy can learn about wigs, turbans and scarves as well as makeup techniques and skin and nail care. Marland Kitchen Keeping your nails moisturized may help with brittleness. . To help with itching, moisturize your skin several times day. . Avoid sun exposure and apply sunscreen routinely when outdoors. . If you get a rash do not put anything on it unless your doctor or nurse says you may. Keep the area around the rash clean and dry. Ask your doctor for medicine if your rash bothers you. . To help with decreased appetite, eat small, frequent meals. . Eat high caloric food such as pudding, ice cream, yogurt and milkshakes. . Drink plenty of fluids  (a minimum of eight glasses per day is recommended). . If you throw up or have loose bowel movements, you should drink more fluids so that you do not become dehydrated (lack water in the body from losing too much fluid). . To help with nausea and vomiting, eat small, frequent meals instead of three large meals a day. Choose foods and drinks that are at room temperature. Ask your nurse or doctor about other helpful tips and medicine that is available to help or stop lessen these symptoms. . If you get diarrhea, eat low-fiber foods that are high in protein and calories and avoid foods that can irritate your digestive tracts or lead to cramping. . Ask your nurse or doctor about medicine that can lessen or stop your diarrhea. . Mouth care is very important. Your mouth care should consist of routine, gentle cleaning of your teeth or dentures and rinsing your mouth with a mixture of 1/2 teaspoon of salt in 8 ounces of water or  teaspoon of baking soda in 8 ounces of water. This should be done at least after each meal and at bedtime. . If you have mouth sores, avoid mouthwash that has alcohol.  Also avoid alcohol and smoking because they can bother your mouth and throat. . Manage tiredness by pacing your activities for the day. . Be sure to include periods of rest between energy-draining activities. . To help decrease your risk of infections, wash your hands regularly. . Avoid close contact with people who have a cold, the flu, or other infections. . Use a soft toothbrush. Check with your nurse before using dental floss. . Be very careful when using knives or tools. . Use an electric shaver instead of a razor.  Food and Drug Interactions . There are no known interactions of fluorouracil with food. . Check with your doctor or pharmacist about all other prescription medicines and dietary supplements you are taking before starting this medicine as there are a lot of known drug interactions with  fluorouracil. Also, check with your doctor or pharmacist before starting any new prescription or over-the-counter medicines, or dietary supplement to make sure that there are no interactions.  When to Call the Doctor Call your doctor or nurse if you have any of these symptoms and/or any new or unusual symptoms: . Fever of 100.5 F (38 C) or higher . Chills . Easy bleeding or bruising . Trouble breathing . Feeling dizzy or lightheaded . Feeling that your heart is beating in a fast or not normal way (palpitations) . Chest pain or symptoms of a heart attack. Most heart attacks involve pain in the center of the chest that lasts more than a few minutes. The pain may go away and come back or it can be constant. It can feel like pressure, squeezing, fullness, or pain. Sometimes pain is felt in one or both arms, the back, neck, jaw, or stomach. If any of these symptoms last 2 minutes, call 911. Marland Kitchen Confusion and/or agitation . Hallucinations . Trouble understanding or speaking . Blurry vision or changes in your eyesight . Numbness or lack of strength to your arms, legs, face, or body . Nausea that stops you from eating or drinking and/or is not relieved by prescribed medicines . Throwing up more than 3 times a day . Loose bowel movements (diarrhea) 4 times a day or loose bowel movements with lack of strength or a feeling of being dizzy . Lasting loss of appetite or rapid weight loss of five pounds in a week . Pain in your mouth or throat that makes it hard to eat or drink . Pain along the digestive tract - especially if worse after eating . Blood in your vomit (bright red or coffee-ground) and/or stools (bright red, or black/tarry) . Coughing up blood . Fatigue that interferes with your daily activities . Painful, red, or swollen areas on your hands or feet . Numbness and/or tingling of your hands and/or feet . Signs of allergic reaction: swelling of the face, feeling like your tongue or throat  are swelling, trouble breathing, rash, itching, fever, chills, feeling dizzy, and/or feeling that your heart is beating in a fast or not normal way . If you think you are pregnant or may have impregnated your partner  Reproduction Warnings . Pregnancy warning: This drug may have harmful effects on the unborn baby. Women of child bearing potential should use effective methods of birth control during your cancer treatment. Let your doctor know right away if you think you may be pregnant. . Breastfeeding warning: It is not known if this drug passes into breast milk. For this reason, women should talk to their doctor about the risks and benefits of  breast feeding during treatment with this drug because this drug may enter the breast milk and cause harm to a breast feeding baby. . Fertility warning: In men and women both, this drug may affect your ability to have children in the future. Talk with your doctor or nurse if you plan to have children. Ask for information on sperm or egg banking.   Leucovorin Calcium (Generic Name) Other Names: Leucovorin, Wellcovorin, Folinic Acid, Citrovorum Factor  About This Drug Leucovorin is a vitamin. It is used in combination with other cancer fighting drugs such as 5-fluorouracil and methotrexate. Leucovorin helps 5-fluorouracil kill the cancer cells. It also helps to reduce the side effects of methotrexate. Leucovorin is given in the vein (IV) or by mouth (orally).  Will take 2 hours to infuse.  Possible Side Effects . Rash . Wheezing Leucovorin by itself has very few side effects. Side effects you may have can be caused by the other drugs you are taking, such as 5-fluorouracil or methotrexate.  Allergic Reactions . Allergic reactions to this drug are rare. While you are getting this drug by in your vein (IV), please tell your nurse right away if you have any of these symptoms of an allergic reaction: . Trouble catching your breath . Feeling like  your tongue or throat are swelling . Feeling your heart beat quickly or in a not normal way (palpitations) . Feeling dizzy or lightheaded . Flushing, itching, rash, and/or hives . If you are taking the oral form of leucovorin and you have these symptoms, do not take another dose of this drug and get urgent medical treatment.  Treating Side Effects If you get a rash do not put anything on it unless your doctor or nurse says you may. Keep the area around the rash clean and dry. Ask your doctor for medicine if your rash bothers you.  Food and Drug Interactions There are no known interactions of leucovorin with food. This drug may interact with other medicines. Tell your doctor and pharmacist about all the medicines and dietary supplements (vitamins, minerals, herbs and others) that you are taking at this time. The safety and use of dietary supplements and alternative diets are often not known. Using these might affect your cancer or interfere with your treatment. Until more is known, you should not use dietary supplements or alternative diets without your cancer doctor's help.  When to Call the Doctor . Call your doctor or nurse right away if you have any of these symptoms: . Wheezing or trouble breathing . Rash with or without itching, redness, or hives . If you take leucovorin by mouth, please also call your doctor right away if you have: Marland Kitchen Throwing up (vomiting) . Loose bowel movements (diarrhea)  Reproduction Concerns . Pregnancy warning: It is not known if this drug may harm an unborn child. For this reason, be sure to talk with your doctor if you are pregnant or planning to become pregnant while getting this drug. . Genetic counseling is available for you to talk about the effects of this drug therapy on future pregnancies. Also, a genetic counselor can look at the possible risk of problems in the unborn baby due to this medicine if an exposure happens during pregnancy. . Breast  feeding warning: It is not known if this drug passes into breast milk. For this reason, women should talk to their doctor about the risks and benefits of breast feeding during treatment with this drug because this drug may enter the breast milk  and badly harm a breast feeding baby.    Oxaliplatin (Generic Name) Other Names: EloxatinTM  About This Drug Oxaliplatin is used to treat cancer. It is given in the vein (IV).  Will take 2 hours to infuse and will infuse with the leucovorin.    Possible Side Effects (More Common) . Effects on the nerves are called peripheral neuropathy. You may feel numbness, tingling, or pain in your hands and feet. Cold temperatures can make it worse. Avoid cold drinks with ice. Dress warmly and cover the skin if you go out in the cold. Wear socks or gloves if you have to touch cold objects like cold flooring or items in the refrigerator or freezer. . It may be hard for you to button your clothes, open jars, or walk as usual. The effect on the nerves may get worse with more doses of the drug. These effects get better in some people after the drug is stopped but it does not get better in all people . Bone marrow depression: This is a decrease in the number of white blood cells, red blood cells, and platelets. It may increase your risk for infection, make you tired and weak (fatigue), and raise your risk of bleeding. . Nausea and throwing up (vomiting). These symptoms may happen within a few hours after your treatment and may last up to 72 hours. Medicines are available to stop or lessen these side effects. . Electrolyte changes. Your blood will be checked for electrolyte changes as needed.  Possible Side Effects (Less Common) . Skin and tissue irritation: This may involve redness, pain, warmth, or swelling at the IV site. This happens if the drug leaks out of the vein and into nearby tissue. . Serious allergic reactions including anaphylaxis are rare. While you  are getting this drug in your vein (IV), tell your nurse right away if you have any of these symptoms of an allergic reaction: . Trouble catching your breath. . Feeling like your tongue or throat are swelling. . Feeling your heart beat quickly or in a not normal way (palpitations). . Feeling dizzy or lightheaded. . Flushing, itching, rash, or hives. . Changes in your liver function. Your doctor will check your liver function as needed.  Treating Side Effects Oxaliplatin (Generic Name) continued Page 12 of 14 . Do not drink cold drinks or use ice cubes in drinks. Drink fluids at room temperature or warmer. Drink through a straw. . Wear gloves to touch cold objects. Be aware that most metals are cold to the touch, especially in winter. Examples are your car door handle and your mail box latch. . Wear warm clothing in cold weather at all times. Cover your mouth and nose with a scarf or a pulldown cap (ski cap) to warm the air that goes to your lungs. . Ask your doctor or nurse about medicine to treat nausea, throwing up, headache, or loose bowel movements. . While you are getting this drug in your vein, tell your nurse right away if you have any pain, redness, or swelling at the site of the IV infusion. . Mouth care is very important. Your mouth care should consist of routine, gentle cleaning of your teeth or dentures and rinsing your mouth with a mixture of 1/2 teaspoon of salt in 8 ounces of water or  teaspoon of baking soda in 8 ounces of water. This should be done at least after each meal and at bedtime. . Drink 6-8 cups of fluids each day unless your  doctor has told you to limit your fluid intake due to some other health problem. A cup is 8 ounces of fluid. If you throw up or have loose bowel movements you should drink more fluids so that you do not become dehydrated (lack water in the body due to losing too much fluid).  Food and Drug Interactions There are no known interactions of  oxaliplatin with food. This drug may interact with other medicines. Tell your doctor and pharmacist about all the medicines and dietary supplements (vitamins, minerals, herbs and others) that you are taking at this time. The safety and use of dietary supplements and alternative diets are often not known. Using these might affect your cancer or interfere with your treatment. Until more is known, you should not use dietary supplements or alternative diets without your cancer doctor's help.  When to Call the Doctor Call your doctor or nurse right away if you have any of these symptoms: . Fever of 100.5 F (38 C) or above . Chills . Easy bruising or bleeding . Wheezing or trouble breathing . Rash or itching . Feeling dizzy or lightheaded . Feeling that your heart is beating in a fast or not normal way (palpitations) . Loose bowel movements (diarrhea) more than 4 times a day or diarrhea with weakness or feeling lightheaded . Blurred vision or other changes in eyesight . Pain when passing urine; blood in urine . Pain in your lower back or side . Feeling confused or agitated . Nausea that stops you from eating or drinking . Throwing up more than 3 times a day . Chest pain or symptoms of a heart attack. Most heart attacks involve pain in the center of the chest that lasts more than a few minutes. The pain may go away and come back. It can feel like pressure, squeezing, fullness, or pain. Sometimes pain is felt in one or both arms, the back, neck, jaw, or stomach. If any of these symptoms last 2 minutes, call 911. Marland Kitchen Symptoms of a stroke such as sudden numbness or weakness of your face, arm, or leg, mostly on one side of your body; sudden confusion, trouble speaking or understanding; sudden trouble seeing in one or both eyes; sudden trouble walking, feeling dizzy, loss of balance or coordination; or sudden, bad headache with no known cause. If you have any of these symptoms for 2 minutes,  call 911. . Signs of liver problems: dark urine, pale bowel movements, bad stomach pain, feeling very tired and weak, itching, or yellowing of the eyes or skin. Call your doctor or nurse as soon as possible if any of these symptoms happen: . Change in hearing, ringing in the ears . Decreased urine . Unusual thirst or passing urine often . Pain in your mouth or throat that makes it hard to eat or drink . Nausea that is not relieved by prescribed medicines . Rash that is not relieved by prescribed medicines . Heavy menstrual period that lasts longer than normal . Numbness, tingling, decreased feeling or weakness in fingers, toes, arms, or legs . Trouble walking or changes in the way you walk, feeling clumsy when buttoning clothes, opening jars, or other routine hand motions . Swelling of legs, ankles, or feet . Weight gain of 5 pounds in one week (fluid retention) . Lasting loss of appetite or rapid weight loss of five pounds in a week . Fatigue that interferes with your daily activities . Headache that does not go away . Painful, red, or swollen  areas on your hands or feet. . No bowel movement for 3 days or you feel uncomfortable . Extreme weakness that interferes with normal activities  Reproduction Concerns Infertility Warning: Sexual problems and reproduction concerns may happen. In both men and women, this drug may affect your ability to have children. This cannot be determined before your treatment. Talk with your doctor or nurse if you plan to have children. Ask for information on sperm or egg banking. In men, this drug may interfere with your ability to make sperm, but it should not change your ability to have sexual relations. In women, menstrual bleeding may become irregular or stop while you are getting this drug. Do not assume that you cannot become pregnant if you do not have a menstrual period. Women may go through signs of menopause (change of life) like vaginal dryness  or itching. Vaginal lubricants can be used to lessen vaginal dryness, itching, and pain during sexual relations. Genetic counseling is available for you to talk about the effects of this drug therapy on future pregnancies. Also, a genetic counselor can look at the possible risk of problems in the unborn baby due to this medicine if an exposure happens during pregnancy. Pregnancy warning: This drug may have harmful effects on the unborn child, so effective methods of birth control should be used during your cancer treatment. Breast feeding warning: It is not known if this drug passes into breast milk. For this reason, women should talk to their doctor about the risks and benefits of breast feeding during treatment with this drug because this drug may enter the breast milk and badly harm a breast feeding baby.   SELF CARE ACTIVITIES WHILE ON CHEMOTHERAPY: Hydration Increase your fluid intake 48 hours prior to treatment and drink at least 8 to 12 cups (64 ounces) of water/decaff beverages per day after treatment. You can still have your cup of coffee or soda but these beverages do not count as part of your 8 to 12 cups that you need to drink daily. No alcohol intake.  Medications Continue taking your normal prescription medication as prescribed.  If you start any new herbal or new supplements please let us know first to make sure it is safe.  Mouth Care Have teeth cleaned professionally before starting treatment. Keep dentures and partial plates clean. Use soft toothbrush and do not use mouthwashes that contain alcohol. Biotene is a good mouthwash that is available at most pharmacies or may be ordered by calling [phone]. Use warm salt water gargles (1 teaspoon salt per 1 quart warm water) before and after meals and at bedtime. Or you may rinse with 2 tablespoons of three-percent hydrogen peroxide mixed in eight ounces of water. If you are still having problems with your mouth or sores in your  mouth please call the clinic. If you need dental work, please let the doctor know before you go for your appointment so that we can coordinate the best possible time for you in regards to your chemo regimen. You need to also let your dentist know that you are actively taking chemo. We may need to do labs prior to your dental appointment.   Skin Care Always use sunscreen that has not expired and with SPF (Sun Protection Factor) of 50 or higher. Wear hats to protect your head from the sun. Remember to use sunscreen on your hands, ears, face, & feet.  Use good moisturizing lotions such as udder cream, eucerin, or even Vaseline. Some chemotherapies can cause  dry skin, color changes in your skin and nails.    . Avoid long, hot showers or baths. . Use gentle, fragrance-free soaps and laundry detergent. . Use moisturizers, preferably creams or ointments rather than lotions because the thicker consistency is better at preventing skin dehydration. Apply the cream or ointment within 15 minutes of showering. Reapply moisturizer at night, and moisturize your hands every time after you wash them.  Hair Loss (if your doctor says your hair will fall out)  . If your doctor says that your hair is likely to fall out, decide before you begin chemo whether you want to wear a wig. You may want to shop before treatment to match your hair color. . Hats, turbans, and scarves can also camouflage hair loss, although some people prefer to leave their heads uncovered. If you go bare-headed outdoors, be sure to use sunscreen on your scalp. . Cut your hair short. It eases the inconvenience of shedding lots of hair, but it also can reduce the emotional impact of watching your hair fall out. . Don't perm or color your hair during chemotherapy. Those chemical treatments are already damaging to hair and can enhance hair loss. Once your chemo treatments are done and your hair has grown back, it's OK to resume dyeing or perming  hair. With chemotherapy, hair loss is almost always temporary. But when it grows back, it may be a different color or texture. In older adults who still had hair color before chemotherapy, the new growth may be completely gray.  Often, new hair is very fine and soft.  Infection Prevention Please wash your hands for at least 30 seconds using warm soapy water. Handwashing is the #1 way to prevent the spread of germs. Stay away from sick people or people who are getting over a cold. If you develop respiratory systems such as green/yellow mucus production or productive cough or persistent cough let us know and we will see if you need an antibiotic. It is a good idea to keep a pair of gloves on when going into grocery stores/[employer] to decrease your risk of coming into contact with germs on the carts, etc. Carry alcohol hand gel with you at all times and use it frequently if out in public. If your temperature reaches 100.5 or higher please call the clinic and let us know.  If it is after hours or on the weekend please go to the ER if your temperature is over 100.5.  Please have your own personal thermometer at home to use.    Sex and bodily fluids If you are going to have sex, a condom must be used to protect the person that isn't taking chemotherapy. Chemo can decrease your libido (sex drive). For a few days after chemotherapy, chemotherapy can be excreted through your bodily fluids.  When using the toilet please close the lid and flush the toilet twice.  Do this for a few day after you have had chemotherapy.     Effects of chemotherapy on your sex life Some changes are simple and won't last long. They won't affect your sex life permanently. Sometimes you may feel: . too tired . not strong enough to be very active . sick or sore  . not in the mood . anxious or low Your anxiety might not seem related to sex. For example, you may be worried about the cancer and how your treatment is going. Or you may be  worried about money, or about how you family are  coping with your illness. These things can cause stress, which can affect your interest in sex. It's important to talk to your partner about how you feel. Remember - the changes to your sex life don't usually last long. There's usually no medical reason to stop having sex during chemo. The drugs won't have any long term physical effects on your performance or enjoyment of sex. Cancer can't be passed on to your partner during sex  Contraception It's important to use reliable contraception during treatment. Avoid getting pregnant while you or your partner are having chemotherapy. This is because the drugs may harm the baby. Sometimes chemotherapy drugs can leave a man or woman infertile.  This means you would not be able to have children in the future. You might want to talk to someone about permanent infertility. It can be very difficult to learn that you may no longer be able to have children. Some people find counselling helpful. There might be ways to preserve your fertility, although this is easier for men than for women. You may want to speak to a fertility expert. You can talk about sperm banking or harvesting your eggs. You can also ask about other fertility options, such as donor eggs. If you have or have had breast cancer, your doctor might advise you not to take the contraceptive pill. This is because the hormones in it might affect the cancer.  It is not known for sure whether or not chemotherapy drugs can be passed on through semen or secretions from the vagina. Because of this some doctors advise people to use a barrier method if you have sex during treatment. This applies to vaginal, anal or oral sex. Generally, doctors advise a barrier method only for the time you are actually having the treatment and for about a week after your treatment. Advice like this can be worrying, but this does not mean that you have to avoid being intimate with your  partner. You can still have close contact with your partner and continue to enjoy sex.  Animals If you have cats or birds we just ask that you not change the litter or change the cage.  Please have someone else do this for you while you are on chemotherapy.   Food Safety During and After Cancer Treatment Food safety is important for people both during and after cancer treatment. Cancer and cancer treatments, such as chemotherapy, radiation therapy, and stem cell/bone marrow transplantation, often weaken the immune system. This makes it harder for your body to protect itself from foodborne illness, also called food poisoning. Foodborne illness is caused by eating food that contains harmful bacteria, parasites, or viruses.  Foods to avoid Some foods have a higher risk of becoming tainted with bacteria. These include: Marland Kitchen Unwashed fresh fruit and vegetables, especially leafy vegetables that can hide dirt and other contaminants . Raw sprouts, such as alfalfa sprouts . Raw or undercooked beef, especially ground beef, or other raw or undercooked meat and poultry . Fatty, fried, or spicy foods immediately before or after treatment.  These can sit heavy on your stomach and make you feel nauseous. . Raw or undercooked shellfish, such as oysters. . Sushi and sashimi, which often contain raw fish.  . Unpasteurized beverages, such as unpasteurized fruit juices, raw milk, raw yogurt, or cider . Undercooked eggs, such as soft boiled, over easy, and poached; raw, unpasteurized eggs; or foods made with raw egg, such as homemade raw cookie dough and homemade mayonnaise Simple steps for food  Education officer, museum. . Do not buy food stored or displayed in an unclean area. . Do not buy bruised or damaged fruits or vegetables. . Do not buy cans that have cracks, dents, or bulges. . Pick up foods that can spoil at the end of your shopping trip and store them in a cooler on the way home. Prepare and clean up foods  carefully. . Rinse all fresh fruits and vegetables under running water, and dry them with a clean towel or paper towel. . Clean the top of cans before opening them. . After preparing food, wash your hands for 20 seconds with hot water and soap. Pay special attention to areas between fingers and under nails. . Clean your utensils and dishes with hot water and soap. Marland Kitchen Disinfect your kitchen and cutting boards using 1 teaspoon of liquid, unscented bleach mixed into 1 quart of water.   Dispose of old food. . Eat canned and packaged food before its expiration date (the "use by" or "best before" date). . Consume refrigerated leftovers within 3 to 4 days. After that time, throw out the food. Even if the food does not smell or look spoiled, it still may be unsafe. Some bacteria, such as Listeria, can grow even on foods stored in the refrigerator if they are kept for too long. Take precautions when eating out. . At restaurants, avoid buffets and salad bars where food sits out for a long time and comes in contact with many people. Food can become contaminated when someone with a virus, often a norovirus, or another "bug" handles it. . Put any leftover food in a "to-go" container yourself, rather than having the server do it. And, refrigerate leftovers as soon as you get home. . Choose restaurants that are clean and that are willing to prepare your food as you order it cooked.    MEDICATIONS: Dexamethasone 4mg  tablet.  Take 2 tablets (8 mg total) by mouth daily. Start the day after chemotherapy for 2 days. Take with food.  Take with food.                                                                                                                                                               Zofran/Ondansetron 8mg  tablet. Take 1 tablet every 8 hours as needed for nausea/vomiting. (#1 nausea med to take, this can constipate)  Compazine/Prochlorperazine 10mg  tablet. Take 1 tablet every 6 hours as needed  for nausea/vomiting. (#2 nausea med to take, this can make you sleepy)   EMLA cream. Apply a quarter size amount to port site 1 hour prior to chemo. Do not rub in. Cover with plastic wrap.   Over-the-Counter Meds:  Miralax 1 capful in 8 oz of fluid daily. May increase to two times a day if  needed. This is a stool softener. If this doesn't work proceed you can add:  Senokot S-start with 1 tablet two times a day and increase to 4 tablets two times a day if needed. (total of 8 tablets in a 24 hour period). This is a stimulant laxative.   Call us if this does not help your bowels move.   Imodium 2mg  capsule. Take 2 capsules after the 1st loose stool and then 1 capsule every 2 hours until you go a total of 12 hours without having a loose stool. Call the [facility] if loose stools continue. If diarrhea occurs @ bedtime, take 2 capsules @ bedtime. Then take 2 capsules every 4 hours until morning. Call [facility].     Constipation Sheet *Miralax in 8 oz of fluid daily.  May increase to two times a day if needed.  This is a stool softener.  If this not enough to keep your bowel regular:  You can add:  *Senokot S, start with one tablet twice a day and can increase to 4 tablets twice a day if needed.  This is a stimulant laxative.   Sometimes when you take pain medication you need BOTH a medicine to keep your stool soft and a medicine to help your bowel push it out!  Please call if the above does not work for you.   Do not go more than 2 days without a bowel movement.  It is very important that you do not become constipated.  It will make you feel sick to your stomach (nausea) and can cause abdominal pain and vomiting.    Diarrhea Sheet  If you are having loose stools/diarrhea, please purchase Imodium and begin taking as outlined:  At the first sign of poorly formed or loose stools you should begin taking Imodium(loperamide) 2 mg capsules.  Take two caplets (4mg ) followed by one caplet  (2mg ) every 2 hours until you have had no diarrhea for 12 hours.  During the night take two caplets (4mg ) at bedtime and continue every 4 hours during the night until the morning.  Stop taking Imodium only after there is no sign of diarrhea for 12 hours.    Always call the [facility] if you are having loose stools/diarrhea that you can't get under control.  Loose stools/disrrhea leads to dehydration (loss of water) in your body.  We have other options of trying to get the loose stools/diarrhea to stopped but you must let us know!    Nausea Sheet  Zofran/Ondansetron 8mg  tablet. Take 1 tablet every 8 hours as needed for nausea/vomiting. (#1 nausea med to take, this can constipate)  Compazine/Prochlorperazine 10mg  tablet. Take 1 tablet every 6 hours as needed for nausea/vomiting. (#2 nausea med to take, this can make you sleepy)  You can take these medications together or separately.  We would first like for you to try the Ondansetron by itself and then take the Prochloperizine if needed. But you are allowed to take both medications at the same time if your nausea is that severe.  If you are having persistent nausea (nausea that does not stop) please take these medications on a staggered schedule so that the nausea medication stays in your body.  Please call the [facility] and let us know the amount of nausea that you are experiencing.  If you begin to vomit, you need to call the [facility] and if it is the weekend and you have vomited more than one time and cant get  it to stop-go to the Emergency Room.  Persistent nausea/vomiting can lead to dehydration (loss of fluid in your body) and will make you feel terrible.   Ice chips, sips of clear liquids, foods that are @ room temperature, crackers, and toast tend to be better tolerated.    SYMPTOMS TO REPORT AS SOON AS POSSIBLE AFTER TREATMENT:  FEVER GREATER THAN 100.5 F  CHILLS WITH OR WITHOUT FEVER  NAUSEA AND VOMITING THAT IS NOT  CONTROLLED WITH YOUR NAUSEA MEDICATION  UNUSUAL SHORTNESS OF BREATH  UNUSUAL BRUISING OR BLEEDING  TENDERNESS IN MOUTH AND THROAT WITH OR WITHOUT PRESENCE OF ULCERS  URINARY PROBLEMS  BOWEL PROBLEMS  UNUSUAL RASH    Wear comfortable clothing and clothing appropriate for easy access to any Portacath or PICC line. Let us know if there is anything that we can do to make your therapy better!     What to do if you need assistance after hours or on the weekends: CALL [phone].  HOLD on the line, do not hang up.  You will hear multiple messages but at the end you will be connected with a nurse triage line.  They will contact the doctor if necessary.  Most of the time they will be able to assist you.  Do not call the hospital operator.      I have been informed and understand all of the instructions given to me and have received a copy. I have been instructed to call the clinic [phone] or my family physician as soon as possible for continued medical care, if indicated. I do not have any more questions at this time but understand that I may call the [facility] or the Patient Navigator at [phone] during office hours should I have questions or need assistance in obtaining follow-up care.

## 2017-02-08 NOTE — Progress Notes (Signed)
Chemotherapy teaching pulled together.  appts made. 

## 2017-02-11 ENCOUNTER — Encounter (HOSPITAL_COMMUNITY): Payer: Self-pay | Admitting: Oncology

## 2017-02-11 NOTE — Progress Notes (Signed)
Maxwell Backbone, FNP [address] [place] [address]  Primary esophageal adenocarcinoma [facility]) - Plan: CBC with Differential, Comprehensive metabolic panel  CURRENT THERAPY: Planning on FOLFOX therapy beginning on 02/15/2017  INTERVAL HISTORY: Maxwell Henderson 51 y.o. 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    Primary esophageal adenocarcinoma ([place])   01/16/2017 Initial Diagnosis    Primary esophageal adenocarcinoma ([place])      01/16/2017 Procedure    EGD by Dr. Barney Drain. Partially obstructing, likely malignant esophageal tumor was found in the distal esophagus. Biopsied with surgical path demonstrating Adenocarcinoma. Injected. Treated with argon plasma coagulation (APC) #3. 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 Biopsied with surgical path demonstrating Adenocarcinoma.      01/19/2017 PET scan    NECK Two FDG avid nodes are seen in the base of the neck on the right. The first is seen on CT image 35, just posterior to the right thyroid lobe and the other is seen on CT image 41 measuring 16 mm in short axis with a maximum SUV of 8.6.  CHEST  The patient's known malignancy involving the distal esophagus and cardia of the stomach is FDG avid with a maximum SUV of 10.4. No definitive metastatic nodes within the chest.  ABDOMEN/PELVIS  FDG avid metastatic adenopathy is seen in the gastrohepatic ligament, paraceliac nodes, posterior to the IVC on image 111, and to the left of the SMA on image 112. The most inferior node is seen in the aortocaval region on image 117. The representative node to the left of the SMA was measured on series 4, image 111 measuring 2.5 by 2.1 cm with a maximum SUV of 7. No other FDG avid disease in the abdomen or pelvis. Cholelithiasis is identified.      02/02/2017 Procedure    US guided bx of right supraclavicular LN by IR.      02/05/2017 Pathology Results    Lymph node, needle/core biopsy, Right Supraclavicular - METASTATIC POORLY DIFFERENTIATED CARCINOMA.      02/10/2017 Pathology Results    HER2 - NEGATIVE        HPI Elements   Location: Distal esophagus and cardia of stomach  Quality: Adenocarcinoma  Severity: Stage IVA  Duration: Dx June 2018  Context: No EUS completed pre-treatment.  HER2 negative  Timing:   Modifying Factors:   Associated Signs & Symptoms: Dysphagia and weight loss    Review of Systems  Constitutional: Negative.  Negative for chills, fever and weight loss.  HENT: Negative.   Eyes: Negative.   Respiratory: Negative.  Negative for cough.   Cardiovascular: Negative.  Negative for chest pain.  Gastrointestinal: Negative.  Negative for blood in stool, constipation, diarrhea, melena, nausea and vomiting.  Genitourinary: Negative.   Musculoskeletal: Negative.   Skin: Negative.   Neurological: Negative.  Negative for weakness.  Endo/Heme/Allergies: Negative.   Psychiatric/Behavioral: Negative.     Past Medical History:  Diagnosis Date  . Diabetes mellitus without complication ([place])   . Hypertension   . Primary esophageal adenocarcinoma ([place]) 01/29/2017    Past Surgical History:  Procedure Laterality Date  . ESOPHAGOGASTRODUODENOSCOPY (EGD) WITH PROPOFOL N/A 01/16/2017   Procedure: ESOPHAGOGASTRODUODENOSCOPY (EGD) WITH PROPOFOL;  Surgeon: [name], MD;  [facility];  Service: Endoscopy;  Laterality: N/A;  730   . lipoma removal    .  SAVORY DILATION N/A 01/16/2017   Procedure: SAVORY DILATION;  Surgeon: [name], MD;  Location: [facility];  Service: Endoscopy;  Laterality: N/A;    Family History  Problem Relation Age of Onset  . Prostate cancer Father   . Colon cancer Neg Hx   . Colon polyps Neg Hx     Social History   Social History  . Marital status: Married      Spouse name: N/A  . Number of children: N/A  . Years of education: N/A   Social History Main Topics  . Smoking status: Current Some Day Smoker    Packs/day: 0.25    Types: Cigarettes  . Smokeless tobacco: Never Used  . Alcohol use No     Comment: hx heavy alcohol, hasn't drank in 19 yrs  . Drug use: Yes    Types: Marijuana     Comment: history of marijuana   . Sexual activity: Not Asked   Other Topics Concern  . None   Social History Narrative  . None     PHYSICAL EXAMINATION  ECOG PERFORMANCE STATUS: 1 - Symptomatic but completely ambulatory  Vitals:   02/12/17 0934  BP: 135/87  Pulse: 67  Resp: 16  Temp: 98.3 F (36.8 C)    GENERAL:alert, no distress, well nourished, well developed, comfortable, cooperative, smiling and accompanied by wife. SKIN: skin color, texture, turgor are normal, no rashes or significant lesions HEAD: Normocephalic, No masses, lesions, tenderness or abnormalities EYES: normal, EOMI, Conjunctiva are pink and non-injected EARS: External ears normal OROPHARYNX:lips, buccal mucosa, and tongue normal and mucous membranes are moist  NECK: supple, trachea midline LYMPH:  not examined BREAST:not examined LUNGS: clear to auscultation and percussion HEART: regular rate & rhythm, no murmurs, no gallops, S1 normal and S2 normal ABDOMEN:abdomen soft, non-tender and normal bowel sounds BACK: Back symmetric, no curvature. EXTREMITIES:less then 2 second capillary refill, no joint deformities, effusion, or inflammation, no skin discoloration, no cyanosis  NEURO: alert & oriented x 3 with fluent speech, no focal motor/sensory deficits, gait normal   LABORATORY DATA: CBC    Component Value Date/Time   WBC 5.9 02/02/2017 1121   RBC 5.02 02/02/2017 1121   HGB 9.9 (L) 02/02/2017 1121   HCT 32.1 (L) 02/02/2017 1121   PLT 376 02/02/2017 1121   MCV 63.9 (L) 02/02/2017 1121   MCH 19.7 (L) 02/02/2017 1121   MCHC 30.8 02/02/2017 1121   RDW 15.4  02/02/2017 1121   LYMPHSABS 2.4 01/15/2017 1820   MONOABS 0.5 01/15/2017 1820   EOSABS 0.1 01/15/2017 1820   BASOSABS 0.0 01/15/2017 1820      Chemistry      Component Value Date/Time   NA 138 01/15/2017 1820   K 3.7 01/15/2017 1820   CL 105 01/15/2017 1820   CO2 26 01/15/2017 1820   BUN 8 01/15/2017 1820   CREATININE 1.10 01/15/2017 1820      Component Value Date/Time   CALCIUM 8.7 (L) 01/15/2017 1820        PENDING LABS:   RADIOGRAPHIC STUDIES:  Nm Pet Image Initial (pi) Skull Base To Thigh  Result Date: 01/19/2017 CLINICAL DATA:  Initial treatment strategy for adenocarcinoma of the esophagus and stomach. EXAM: NUCLEAR MEDICINE PET SKULL BASE TO THIGH TECHNIQUE: 10.2 mCi F-18 FDG was injected intravenously. Full-ring PET imaging was performed from the skull base to thigh after the radiotracer. CT data was obtained and used for attenuation correction and anatomic localization. FASTING BLOOD GLUCOSE:  Value: 123  mg/dl COMPARISON:  None. FINDINGS: NECK Two FDG avid nodes are seen in the base of the neck on the right. The first is seen on CT image 35, just posterior to the right thyroid lobe and the other is seen on CT image 41 measuring 16 mm in short axis with a maximum SUV of 8.6. CHEST The patient's known malignancy involving the distal esophagus and cardia of the stomach is FDG avid with a maximum SUV of 10.4. No definitive metastatic nodes within the chest. ABDOMEN/PELVIS FDG avid metastatic adenopathy is seen in the gastrohepatic ligament, paraceliac nodes, posterior to the IVC on image 111, and to the left of the SMA on image 112. The most inferior node is seen in the aortocaval region on image 117. The representative node to the left of the SMA was measured on series 4, image 111 measuring 2.5 by 2.1 cm with a maximum SUV of 7. No other FDG avid disease in the abdomen or pelvis. Cholelithiasis is identified. SKELETON No focal hypermetabolic activity to suggest skeletal  metastasis. IMPRESSION: 1. The patient's primary malignancy involving the distal esophagus and gastric cardia is FDG avid with a maximum SUV of 10.4. FDG avid nodes in the base of the neck on the right are most consistent with metastatic disease. There are metastatic nodes in the upper abdomen as described above. Electronically Signed   By: Dorise Bullion III M.D   On: 01/19/2017 09:36   US Biopsy  Result Date: 02/02/2017 INDICATION: 51 year old male with a history of esophageal adenocarcinoma. He has a suspicious right supraclavicular lymph node which is FDG avid on PET-CT and presents for biopsy to confirm metastatic disease. EXAM: Ultrasound-guided biopsy of right supraclavicular lymph node. MEDICATIONS: None. ANESTHESIA/SEDATION: Moderate (conscious) sedation was employed during this procedure. A total of Versed 1 mg and Fentanyl 50 mcg was administered intravenously. Moderate Sedation Time: 10 minutes. The patient's level of consciousness and vital signs were monitored continuously by radiology nursing throughout the procedure under my direct supervision. FLUOROSCOPY TIME:  Zero COMPLICATIONS: None immediate. PROCEDURE: Informed written consent was obtained from the patient after a thorough discussion of the procedural risks, benefits and alternatives. All questions were addressed. Maximal Sterile Barrier Technique was utilized including caps, mask, sterile gowns, sterile gloves, sterile drape, hand hygiene and skin antiseptic. A timeout was performed prior to the initiation of the procedure. The right supraclavicular station was interrogated with ultrasound. A 2.0 x 1.7 hypoechoic soft tissue mass is present immediately adjacent to the internal jugular vein. Local anesthesia was attained by infiltration with 1% lidocaine. A small dermatotomy was made. Under real-time sonographic guidance, multiple 18 gauge core biopsies were coaxially obtained. Biopsy specimens were placed in formalin and delivered to  pathology for further analysis. Post biopsy ultrasound imaging demonstrates no evidence of complication. The patient tolerated the procedure well. IMPRESSION: Successful ultrasound-guided core biopsy of right supraclavicular lymph node. Electronically Signed   By: Jacqulynn Cadet M.D.   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  EGD and biopsy on 01/16/2017 demonstrated malignancy.  Pre-treatment EUS not completed.  Oncology history updated.  I personally reviewed and went over radiographic studies with the patient.  The results are noted within this dictation.  I personally reviewed the images in PACS.  I personally reviewed and went over pathology results  with the patient.  Pathology from LN biopsy demonstrates adnoacarcinoma, consistent with metastatic disease.  HER2 testing on LN biopsy was requested.  It was negative.  Patient is scheduled for port placement by IR on 02/13/2017.  We will start palliative FOLFOX every 14 days beginning on 02/15/2017.  Goals of care discussion broached.  Patient advised of his Stage IV dx and the terminality of this diagnosis.  He is educated that this cancer is not curable, but treatable with the goal of improving progression-free survival and improve his symptoms.  Return on 02/15/2017 to start chemotherapy.  Pre-treatment labs are ordered: CBC diff, CMET.  I have reviewed the risks, benefits, alternatives, and side effects of FOLFOX chemotherapy, including, but not limited to, nausea/vomiting, diarrhea, stomatitis, GERD, palmar-plantar erythrodysesthesia, decreased blood counts, increased risk of infection, change in liver/renal function, anaphylaxis, peripheral neuropathy, fatigue/weakness, death.  He will bring FMLA papers with him when he returns for  chemotherapy and we will complete these for him.  He is provided a patient booklet from [employer] regarding esophageal cancer.  Return for Nadir check with labs 1 week following day 1, cycle 1.   ORDERS PLACED FOR THIS ENCOUNTER: Orders Placed This Encounter  Procedures  . CBC with Differential  . Comprehensive metabolic panel    MEDICATIONS PRESCRIBED THIS ENCOUNTER: No orders of the defined types were placed in this encounter.   THERAPY PLAN:  Following port placement on 7/3, will start palliaitve chemotherapy consisting of FOLFOX.  Will plan on restaging scans following cycle 4-6.  All questions were answered. The patient knows to call the clinic with any problems, questions or concerns. We can certainly see the patient much sooner if necessary.  Patient and plan discussed with Dr. Twana First and she is in agreement with the aforementioned.   This note is electronically signed by: Doy Mince 02/12/2017 9:39 AM

## 2017-02-11 NOTE — Assessment & Plan Note (Addendum)
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  EGD and biopsy on 01/16/2017 demonstrated malignancy.  Pre-treatment EUS not completed.  Oncology history updated.  I personally reviewed and went over radiographic studies with the patient.  The results are noted within this dictation.  I personally reviewed the images in PACS.  I personally reviewed and went over pathology results with the patient.  Pathology from LN biopsy demonstrates adnoacarcinoma, consistent with metastatic disease.  HER2 testing on LN biopsy was requested.  It was negative.  Patient is scheduled for port placement by IR on 02/13/2017.  We will start palliative FOLFOX every 14 days beginning on 02/15/2017.  Goals of care discussion broached.  Patient advised of his Stage IV dx and the terminality of this diagnosis.  He is educated that this cancer is not curable, but treatable with the goal of improving progression-free survival and improve his symptoms.  Return on 02/15/2017 to start chemotherapy.  Pre-treatment labs are ordered: CBC diff, CMET.  I have reviewed the risks, benefits, alternatives, and side effects of FOLFOX chemotherapy, including, but not limited to, nausea/vomiting, diarrhea, stomatitis, GERD, palmar-plantar erythrodysesthesia, decreased blood counts, increased risk of infection, change in liver/renal function, anaphylaxis, peripheral neuropathy, fatigue/weakness, death.  He will bring FMLA papers with him when he returns for chemotherapy and we will complete these for him.  He is provided a patient booklet from [facility] regarding esophageal cancer.  Return for Nadir check with labs 1 week following day 1, cycle 1.

## 2017-02-12 ENCOUNTER — Encounter (HOSPITAL_COMMUNITY): Payer: Self-pay

## 2017-02-12 ENCOUNTER — Encounter (HOSPITAL_COMMUNITY): Payer: Self-pay | Attending: Oncology | Admitting: Oncology

## 2017-02-12 ENCOUNTER — Encounter (HOSPITAL_COMMUNITY): Payer: Self-pay | Admitting: Oncology

## 2017-02-12 ENCOUNTER — Other Ambulatory Visit: Payer: Self-pay | Admitting: Radiology

## 2017-02-12 DIAGNOSIS — R1310 Dysphagia, unspecified: Secondary | ICD-10-CM | POA: Insufficient documentation

## 2017-02-12 DIAGNOSIS — F17210 Nicotine dependence, cigarettes, uncomplicated: Secondary | ICD-10-CM | POA: Insufficient documentation

## 2017-02-12 DIAGNOSIS — E119 Type 2 diabetes mellitus without complications: Secondary | ICD-10-CM | POA: Insufficient documentation

## 2017-02-12 DIAGNOSIS — Z8042 Family history of malignant neoplasm of prostate: Secondary | ICD-10-CM | POA: Insufficient documentation

## 2017-02-12 DIAGNOSIS — C160 Malignant neoplasm of cardia: Secondary | ICD-10-CM | POA: Insufficient documentation

## 2017-02-12 DIAGNOSIS — Z9889 Other specified postprocedural states: Secondary | ICD-10-CM | POA: Insufficient documentation

## 2017-02-12 DIAGNOSIS — Z8371 Family history of colonic polyps: Secondary | ICD-10-CM | POA: Insufficient documentation

## 2017-02-12 DIAGNOSIS — C770 Secondary and unspecified malignant neoplasm of lymph nodes of head, face and neck: Secondary | ICD-10-CM | POA: Insufficient documentation

## 2017-02-12 DIAGNOSIS — C159 Malignant neoplasm of esophagus, unspecified: Secondary | ICD-10-CM

## 2017-02-12 DIAGNOSIS — C155 Malignant neoplasm of lower third of esophagus: Secondary | ICD-10-CM

## 2017-02-12 DIAGNOSIS — I10 Essential (primary) hypertension: Secondary | ICD-10-CM | POA: Insufficient documentation

## 2017-02-12 DIAGNOSIS — C779 Secondary and unspecified malignant neoplasm of lymph node, unspecified: Secondary | ICD-10-CM

## 2017-02-12 DIAGNOSIS — Z800 Family history of malignant neoplasm of digestive organs: Secondary | ICD-10-CM | POA: Insufficient documentation

## 2017-02-12 MED ORDER — POLYETHYLENE GLYCOL 3350 17 GM/SCOOP PO POWD: ORAL | 2 refills | Status: DC

## 2017-02-12 NOTE — Patient Instructions (Addendum)
[place] at [facility] Discharge Instructions  RECOMMENDATIONS MADE BY THE CONSULTANT AND ANY TEST RESULTS WILL BE SENT TO YOUR REFERRING PHYSICIAN.  You were seen today by Kirby Crigler PA-C. Port placement on 7/3. Chemo on 7/5 then return on 7/7 to d/c pump. Return around 7/12 for labs and follow up. Return every 2 weeks for treatment. Return around August 1 for follow up.    Thank you for choosing [place] at [facility] to provide your oncology and hematology care.  To afford each patient quality time with our provider, please arrive at least 15 minutes before your scheduled appointment time.    If you have a lab appointment with the [facility] please come in thru the  Main Entrance and check in at the main information desk  You need to re-schedule your appointment should you arrive 10 or more minutes late.  We strive to give you quality time with our providers, and arriving late affects you and other patients whose appointments are after yours.  Also, if you no show three or more times for appointments you may be dismissed from the clinic at the providers discretion.     Again, thank you for choosing [facility].  Our hope is that these requests will decrease the amount of time that you wait before being seen by our physicians.       _____________________________________________________________  Should you have questions after your visit to [facility], please contact our office at [phone] between the hours of 8:30 a.m. and 4:30 p.m.  Voicemails left after 4:30 p.m. will not be returned until the following business day.  For prescription refill requests, have your pharmacy contact our office.       Resources For Cancer Patients and their Caregivers ? American Cancer Society: Can assist with transportation, wigs, general needs, runs Look Good Feel Better.        229-013-2020 ? Cancer  Care: Provides financial assistance, online support groups, medication/co-pay assistance.  1-800-813-HOPE 820-243-9530) ? [place] Assists [place] cancer patients and their families through emotional , educational and financial support.  548 089 5869 ? [place] DSS Where to apply for food stamps, Medicaid and utility assistance. 7863421054 ? [facility]: Transportation to medical appointments. (813)294-4309 ? Social Security Administration: May apply for disability if have a Stage IV cancer. 971-402-1030 (508)462-4635 ? [employer], Disability and Transit Services: Assists with nutrition, care and transit needs. [place] Support Programs: @10RELATIVEDAYS @ > Cancer Support Group  2nd Tuesday of the month 1pm-2pm, Journey Room  > Creative Journey  3rd Tuesday of the month 1130am-1pm, Journey Room  > Look Good Feel Better  1st Wednesday of the month 10am-12 noon, Journey Room (Call [place] to register [phone])

## 2017-02-12 NOTE — Progress Notes (Signed)
Consent signed.  Chemotherapy teaching completed.  Extensive teaching packet given.  Miralax called in.

## 2017-02-13 ENCOUNTER — Ambulatory Visit (HOSPITAL_COMMUNITY)
Admission: RE | Admit: 2017-02-13 | Discharge: 2017-02-13 | Disposition: A | Discharge status: Home / Self Care | Payer: Self-pay | Source: Ambulatory Visit | Attending: Oncology | Admitting: Oncology

## 2017-02-13 ENCOUNTER — Other Ambulatory Visit (HOSPITAL_COMMUNITY): Payer: Self-pay | Admitting: Oncology

## 2017-02-13 ENCOUNTER — Encounter (HOSPITAL_COMMUNITY): Payer: Self-pay

## 2017-02-13 DIAGNOSIS — C159 Malignant neoplasm of esophagus, unspecified: Secondary | ICD-10-CM

## 2017-02-13 DIAGNOSIS — C799 Secondary malignant neoplasm of unspecified site: Secondary | ICD-10-CM | POA: Insufficient documentation

## 2017-02-13 DIAGNOSIS — E119 Type 2 diabetes mellitus without complications: Secondary | ICD-10-CM | POA: Insufficient documentation

## 2017-02-13 DIAGNOSIS — F17210 Nicotine dependence, cigarettes, uncomplicated: Secondary | ICD-10-CM | POA: Insufficient documentation

## 2017-02-13 DIAGNOSIS — I10 Essential (primary) hypertension: Secondary | ICD-10-CM | POA: Insufficient documentation

## 2017-02-13 HISTORY — PX: PORTA CATH INSERTION: CATH118285

## 2017-02-13 HISTORY — PX: IR FLUORO GUIDE PORT INSERTION RIGHT: IMG5741

## 2017-02-13 HISTORY — PX: IR US GUIDE VASC ACCESS RIGHT: IMG2390

## 2017-02-13 LAB — CBC WITH DIFFERENTIAL/PLATELET
BASOS ABS: 0 10*3/uL (ref 0.0–0.1)
Basophils Relative: 0 %
EOS PCT: 1 %
Eosinophils Absolute: 0.1 10*3/uL (ref 0.0–0.7)
HEMATOCRIT: 30.9 % — AB (ref 39.0–52.0)
HEMOGLOBIN: 9.8 g/dL — AB (ref 13.0–17.0)
LYMPHS ABS: 1.8 10*3/uL (ref 0.7–4.0)
LYMPHS PCT: 32 %
MCH: 19.6 pg — ABNORMAL LOW (ref 26.0–34.0)
MCHC: 31.7 g/dL (ref 30.0–36.0)
MCV: 61.8 fL — AB (ref 78.0–100.0)
MONOS PCT: 5 %
Monocytes Absolute: 0.3 10*3/uL (ref 0.1–1.0)
Neutro Abs: 3.3 10*3/uL (ref 1.7–7.7)
Neutrophils Relative %: 62 %
Platelets: 348 10*3/uL (ref 150–400)
RBC: 5 MIL/uL (ref 4.22–5.81)
RDW: 15.5 % (ref 11.5–15.5)
WBC: 5.5 10*3/uL (ref 4.0–10.5)

## 2017-02-13 LAB — PROTIME-INR
INR: 1.05
Prothrombin Time: 13.7 seconds (ref 11.4–15.2)

## 2017-02-13 LAB — GLUCOSE, CAPILLARY: GLUCOSE-CAPILLARY: 107 mg/dL — AB (ref 65–99)

## 2017-02-13 MED ORDER — FENTANYL CITRATE (PF) 100 MCG/2ML IJ SOLN
INTRAMUSCULAR | Status: AC | PRN
  Administered 2017-02-13: 50 ug via INTRAVENOUS

## 2017-02-13 MED ORDER — LIDOCAINE HCL 1 % IJ SOLN
INTRAMUSCULAR | Status: AC | PRN
  Administered 2017-02-13: 15 mL
  Administered 2017-02-13: 10 mL

## 2017-02-13 MED ORDER — CEFAZOLIN SODIUM-DEXTROSE 2-4 GM/100ML-% IV SOLN
INTRAVENOUS | Status: AC
  Administered 2017-02-13: 2 g via INTRAVENOUS
  Filled 2017-02-13: qty 100

## 2017-02-13 MED ORDER — HEPARIN SOD (PORK) LOCK FLUSH 100 UNIT/ML IV SOLN
INTRAVENOUS | Status: AC
  Filled 2017-02-13: qty 5

## 2017-02-13 MED ORDER — FENTANYL CITRATE (PF) 100 MCG/2ML IJ SOLN
INTRAMUSCULAR | Status: AC
  Filled 2017-02-13: qty 4

## 2017-02-13 MED ORDER — MIDAZOLAM HCL 2 MG/2ML IJ SOLN
INTRAMUSCULAR | Status: AC | PRN
  Administered 2017-02-13: 1 mg via INTRAVENOUS

## 2017-02-13 MED ORDER — LIDOCAINE-EPINEPHRINE (PF) 2 %-1:200000 IJ SOLN
INTRAMUSCULAR | Status: AC
  Filled 2017-02-13: qty 20

## 2017-02-13 MED ORDER — HEPARIN SOD (PORK) LOCK FLUSH 100 UNIT/ML IV SOLN
INTRAVENOUS | Status: AC | PRN
  Administered 2017-02-13: 500 [IU] via INTRAVENOUS

## 2017-02-13 MED ORDER — LIDOCAINE HCL 1 % IJ SOLN
INTRAMUSCULAR | Status: AC
  Filled 2017-02-13: qty 20

## 2017-02-13 MED ORDER — MIDAZOLAM HCL 2 MG/2ML IJ SOLN
INTRAMUSCULAR | Status: AC
  Filled 2017-02-13: qty 6

## 2017-02-13 MED ORDER — CEFAZOLIN SODIUM-DEXTROSE 2-4 GM/100ML-% IV SOLN
2.0000 g | INTRAVENOUS | Status: AC
  Administered 2017-02-13: 2 g via INTRAVENOUS

## 2017-02-13 MED ORDER — SODIUM CHLORIDE 0.9 % IV SOLN
INTRAVENOUS | Status: DC
  Administered 2017-02-13: 12:00:00 via INTRAVENOUS

## 2017-02-13 NOTE — Discharge Instructions (Signed)
Moderate Conscious Sedation, Adult, Care After °These instructions provide you with information about caring for yourself after your procedure. Your health care provider may also give you more specific instructions. Your treatment has been planned according to current medical practices, but problems sometimes occur. Call your health care provider if you have any problems or questions after your procedure. °What can I expect after the procedure? °After your procedure, it is common: °· To feel sleepy for several hours. °· To feel clumsy and have poor balance for several hours. °· To have poor judgment for several hours. °· To vomit if you eat too soon. ° °Follow these instructions at home: °For at least 24 hours after the procedure: ° °· Do not: °? Participate in activities where you could fall or become injured. °? Drive. °? Use heavy machinery. °? Drink alcohol. °? Take sleeping pills or medicines that cause drowsiness. °? Make important decisions or sign legal documents. °? Take care of children on your own. °· Rest. °Eating and drinking °· Follow the diet recommended by your health care provider. °· If you vomit: °? Drink water, juice, or soup when you can drink without vomiting. °? Make sure you have little or no nausea before eating solid foods. °General instructions °· Have a responsible adult stay with you until you are awake and alert. °· Take over-the-counter and prescription medicines only as told by your health care provider. °· If you smoke, do not smoke without supervision. °· Keep all follow-up visits as told by your health care provider. This is important. °Contact a health care provider if: °· You keep feeling nauseous or you keep vomiting. °· You feel light-headed. °· You develop a rash. °· You have a fever. °Get help right away if: °· You have trouble breathing. °This information is not intended to replace advice given to you by your health care provider. Make sure you discuss any questions you have  with your health care provider. °Document Released: 05/21/2013 Document Revised: 01/03/2016 Document Reviewed: 11/20/2015 °Elsevier Interactive Patient Education © 2018 Elsevier Inc. ° ° °Implanted Port Home Guide °An implanted port is a type of central line that is placed under the skin. Central lines are used to provide IV access when treatment or nutrition needs to be given through a person’s veins. Implanted ports are used for long-term IV access. An implanted port may be placed because: °· You need IV medicine that would be irritating to the small veins in your hands or arms. °· You need long-term IV medicines, such as antibiotics. °· You need IV nutrition for a long period. °· You need frequent blood draws for lab tests. °· You need dialysis. ° °Implanted ports are usually placed in the chest area, but they can also be placed in the upper arm, the abdomen, or the leg. An implanted port has two main parts: °· Reservoir. The reservoir is round and will appear as a small, raised area under your skin. The reservoir is the part where a needle is inserted to give medicines or draw blood. °· Catheter. The catheter is a thin, flexible tube that extends from the reservoir. The catheter is placed into a large vein. Medicine that is inserted into the reservoir goes into the catheter and then into the vein. ° °How will I care for my incision site? °Do not get the incision site wet. Bathe or shower as directed by your health care provider. °How is my port accessed? °Special steps must be taken to access the   port: °· Before the port is accessed, a numbing cream can be placed on the skin. This helps numb the skin over the port site. °· Your health care provider uses a sterile technique to access the port. °? Your health care provider must put on a mask and sterile gloves. °? The skin over your port is cleaned carefully with an antiseptic and allowed to dry. °? The port is gently pinched between sterile gloves, and a needle  is inserted into the port. °· Only "non-coring" port needles should be used to access the port. Once the port is accessed, a blood return should be checked. This helps ensure that the port is in the vein and is not clogged. °· If your port needs to remain accessed for a constant infusion, a clear (transparent) bandage will be placed over the needle site. The bandage and needle will need to be changed every week, or as directed by your health care provider. °· Keep the bandage covering the needle clean and dry. Do not get it wet. Follow your health care provider’s instructions on how to take a shower or bath while the port is accessed. °· If your port does not need to stay accessed, no bandage is needed over the port. ° °What is flushing? °Flushing helps keep the port from getting clogged. Follow your health care provider’s instructions on how and when to flush the port. Ports are usually flushed with saline solution or a medicine called heparin. The need for flushing will depend on how the port is used. °· If the port is used for intermittent medicines or blood draws, the port will need to be flushed: °? After medicines have been given. °? After blood has been drawn. °? As part of routine maintenance. °· If a constant infusion is running, the port may not need to be flushed. ° °How long will my port stay implanted? °The port can stay in for as long as your health care provider thinks it is needed. When it is time for the port to come out, surgery will be done to remove it. The procedure is similar to the one performed when the port was put in. °When should I seek immediate medical care? °When you have an implanted port, you should seek immediate medical care if: °· You notice a bad smell coming from the incision site. °· You have swelling, redness, or drainage at the incision site. °· You have more swelling or pain at the port site or the surrounding area. °· You have a fever that is not controlled with  medicine. ° °This information is not intended to replace advice given to you by your health care provider. Make sure you discuss any questions you have with your health care provider. °Document Released: 07/31/2005 Document Revised: 01/06/2016 Document Reviewed: 04/07/2013 °Elsevier Interactive Patient Education © 2017 Elsevier Inc. ° ° °Implanted Port Insertion, Care After °This sheet gives you information about how to care for yourself after your procedure. Your health care provider may also give you more specific instructions. If you have problems or questions, contact your health care provider. °What can I expect after the procedure? °After your procedure, it is common to have: °· Discomfort at the port insertion site. °· Bruising on the skin over the port. This should improve over 3-4 days. ° °Follow these instructions at home: °Port care °· After your port is placed, you will get a manufacturer's information card. The card has information about your port. Keep this card with   you at all times. °· Take care of the port as told by your health care provider. Ask your health care provider if you or a family member can get training for taking care of the port at home. A home health care nurse may also take care of the port. °· Make sure to remember what type of port you have. °Incision care °· Follow instructions from your health care provider about how to take care of your port insertion site. Make sure you: °? Wash your hands with soap and water before you change your bandage (dressing). If soap and water are not available, use hand sanitizer. °? Change your dressing as told by your health care provider. °? Leave stitches (sutures), skin glue, or adhesive strips in place. These skin closures may need to stay in place for 2 weeks or longer. If adhesive strip edges start to loosen and curl up, you may trim the loose edges. Do not remove adhesive strips completely unless your health care provider tells you to do  that. °· Check your port insertion site every day for signs of infection. Check for: °? More redness, swelling, or pain. °? More fluid or blood. °? Warmth. °? Pus or a bad smell. °General instructions °· Do not take baths, swim, or use a hot tub until your health care provider approves. °· Do not lift anything that is heavier than 10 lb (4.5 kg) for a week, or as told by your health care provider. °· Ask your health care provider when it is okay to: °? Return to work or school. °? Resume usual physical activities or sports. °· Do not drive for 24 hours if you were given a medicine to help you relax (sedative). °· Take over-the-counter and prescription medicines only as told by your health care provider. °· Wear a medical alert bracelet in case of an emergency. This will tell any health care providers that you have a port. °· Keep all follow-up visits as told by your health care provider. This is important. °Contact a health care provider if: °· You cannot flush your port with saline as directed, or you cannot draw blood from the port. °· You have a fever or chills. °· You have more redness, swelling, or pain around your port insertion site. °· You have more fluid or blood coming from your port insertion site. °· Your port insertion site feels warm to the touch. °· You have pus or a bad smell coming from the port insertion site. °Get help right away if: °· You have chest pain or shortness of breath. °· You have bleeding from your port that you cannot control. °Summary °· Take care of the port as told by your health care provider. °· Change your dressing as told by your health care provider. °· Keep all follow-up visits as told by your health care provider. °This information is not intended to replace advice given to you by your health care provider. Make sure you discuss any questions you have with your health care provider. °Document Released: 05/21/2013 Document Revised: 06/21/2016 Document Reviewed:  06/21/2016 °Elsevier Interactive Patient Education © 2017 Elsevier Inc. ° ° °

## 2017-02-13 NOTE — Procedures (Signed)
Placement of right jugular portacath.  Tip at SVC/RA junction.  Minimal blood loss and no immediate complication.   

## 2017-02-13 NOTE — H&P (Signed)
Chief Complaint: esophageal cancer  Referring Physician:Dr. Twana First  Supervising Physician: Markus Daft  Patient Status: [facility] - Out-pt  HPI: Maxwell Henderson is a 51 y.o. 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  This was confirmed.  The patient will start chemotherapy on 02-15-17.  He presents today for placement of a PAC.  He has no change in his medical history since 2 weeks ago.  Past Medical History:  Past Medical History:  Diagnosis Date  . Diabetes mellitus without complication ([place])   . Hypertension   . 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 ESOPHAGOGASTRODUODENOSCOPY (EGD) WITH PROPOFOL N/A 01/16/2017   Procedure: ESOPHAGOGASTRODUODENOSCOPY (EGD) WITH PROPOFOL;  Surgeon: [name], MD;  Location: [facility] SUITE;  Service: Endoscopy;  Laterality: N/A;  730   . lipoma removal    . SAVORY DILATION N/A 01/16/2017   Procedure: SAVORY DILATION;  Surgeon: [name], MD;  Location: [facility] SUITE;  Service: Endoscopy;  Laterality: N/A;    Family History:  Family History  Problem Relation Age of Onset  . Prostate cancer Father   . Colon cancer Neg Hx   . Colon polyps Neg Hx     Social History:  reports that he has been smoking Cigarettes.  He has been smoking about 0.25 packs per day. He has never used smokeless tobacco. He reports that he uses drugs, including Marijuana. He reports that he does not drink alcohol.  Allergies: No Known Allergies  Medications: Medications reviewed in epic  Please HPI for pertinent positives, otherwise complete 10 system ROS negative, except he has some dysphagia and has to eat soft food.  Mallampati Score: MD Evaluation Airway: WNL Heart: WNL Abdomen: WNL Chest/ Lungs: WNL ASA  Classification: 2 Mallampati/Airway Score: One  Physical Exam: BP (!)  150/85   Pulse 75   Temp 98.3 F (36.8 C) (Oral)   Resp 18   Ht 6\' 1"  (1.854 m)   Wt 203 lb (92.1 kg)   SpO2 100%   BMI 26.78 kg/m  Body mass index is 26.78 kg/m. General: pleasant, WD, WN black male who is laying in bed in NAD HEENT: head is normocephalic, atraumatic.  Sclera are noninjected.  PERRL.  Ears and nose without any masses or lesions.  Mouth is pink and moist Heart: regular, rate, and rhythm.  Normal s1,s2. No obvious murmurs, gallops, or rubs noted.  Palpable radial pulses bilaterally Lungs: CTAB, no wheezes, rhonchi, or rales noted.  Respiratory effort nonlabored Abd: soft, NT, ND, +BS, no masses, hernias, or organomegaly Psych: A&Ox3 with an appropriate affect.   Labs: Results for orders placed or performed during the hospital encounter of 02/13/17 (from the past 48 hour(s))  CBC with Differential/Platelet     Status: Abnormal   Collection Time: 02/13/17 11:48 AM  Result Value Ref Range   WBC 5.5 4.0 - 10.5 K/uL   RBC 5.00 4.22 - 5.81 MIL/uL   Hemoglobin 9.8 (L) 13.0 - 17.0 g/dL   HCT 30.9 (L) 39.0 - 52.0 %   MCV 61.8 (L) 78.0 - 100.0 fL   MCH 19.6 (L) 26.0 - 34.0 pg   MCHC 31.7 30.0 - 36.0 g/dL   RDW 15.5 11.5 - 15.5 %   Platelets 348 150 - 400 K/uL   Neutrophils Relative % 62 %  Lymphocytes Relative 32 %   Monocytes Relative 5 %   Eosinophils Relative 1 %   Basophils Relative 0 %   Neutro Abs 3.3 1.7 - 7.7 K/uL   Lymphs Abs 1.8 0.7 - 4.0 K/uL   Monocytes Absolute 0.3 0.1 - 1.0 K/uL   Eosinophils Absolute 0.1 0.0 - 0.7 K/uL   Basophils Absolute 0.0 0.0 - 0.1 K/uL   RBC Morphology TARGET CELLS   Protime-INR     Status: None   Collection Time: 02/13/17 11:48 AM  Result Value Ref Range   Prothrombin Time 13.7 11.4 - 15.2 seconds   INR 1.05     Imaging: No results found.  Assessment/Plan 1. Metastatic esophageal cancer  We will plan to proceed with PAC placement today so he can initiate chemotherapy. Risks and Benefits discussed with the patient  including, but not limited to bleeding, infection, pneumothorax, or fibrin sheath development and need for additional procedures. All of the patient's questions were answered, patient is agreeable to proceed. Consent signed and in chart.   Thank you for this interesting consult.  I greatly enjoyed meeting CLABORN JANUSZ and look forward to participating in their care.  A copy of this report was sent to the requesting provider on this date.  Electronically Signed: Henreitta Cea 02/13/2017, 1:35 PM   I spent a total of    25 Minutes in face to face in clinical consultation, greater than 50% of which was counseling/coordinating care for metastatic esophageal cancer

## 2017-02-15 ENCOUNTER — Encounter (HOSPITAL_COMMUNITY): Payer: Self-pay

## 2017-02-15 ENCOUNTER — Encounter (HOSPITAL_BASED_OUTPATIENT_CLINIC_OR_DEPARTMENT_OTHER): Payer: Self-pay

## 2017-02-15 VITALS — BP 143/89 | HR 63 | Temp 98.2°F | Resp 18 | Wt 205.6 lb

## 2017-02-15 DIAGNOSIS — C159 Malignant neoplasm of esophagus, unspecified: Secondary | ICD-10-CM

## 2017-02-15 DIAGNOSIS — C160 Malignant neoplasm of cardia: Secondary | ICD-10-CM

## 2017-02-15 DIAGNOSIS — Z5111 Encounter for antineoplastic chemotherapy: Secondary | ICD-10-CM

## 2017-02-15 DIAGNOSIS — C155 Malignant neoplasm of lower third of esophagus: Secondary | ICD-10-CM

## 2017-02-15 LAB — COMPREHENSIVE METABOLIC PANEL
ALBUMIN: 3.7 g/dL (ref 3.5–5.0)
ALK PHOS: 54 U/L (ref 38–126)
ALT: 10 U/L — ABNORMAL LOW (ref 17–63)
ANION GAP: 7 (ref 5–15)
AST: 20 U/L (ref 15–41)
BILIRUBIN TOTAL: 0.7 mg/dL (ref 0.3–1.2)
BUN: 10 mg/dL (ref 6–20)
CALCIUM: 8.6 mg/dL — AB (ref 8.9–10.3)
CO2: 26 mmol/L (ref 22–32)
Chloride: 100 mmol/L — ABNORMAL LOW (ref 101–111)
Creatinine, Ser: 0.95 mg/dL (ref 0.61–1.24)
GFR calc Af Amer: >60 mL/min (ref >60)
GFR calc non Af Amer: >60 mL/min (ref >60)
GLUCOSE: 171 mg/dL — AB (ref 65–99)
Potassium: 3.7 mmol/L (ref 3.5–5.1)
SODIUM: 133 mmol/L — AB (ref 135–145)
Total Protein: 7 g/dL (ref 6.5–8.1)

## 2017-02-15 LAB — CBC WITH DIFFERENTIAL/PLATELET
BASOS ABS: 0 10*3/uL (ref 0.0–0.1)
Basophils Relative: 0 %
EOS ABS: 0.1 10*3/uL (ref 0.0–0.7)
Eosinophils Relative: 1 %
HEMATOCRIT: 30 % — AB (ref 39.0–52.0)
HEMOGLOBIN: 9.7 g/dL — AB (ref 13.0–17.0)
LYMPHS PCT: 30 %
Lymphs Abs: 1.5 10*3/uL (ref 0.7–4.0)
MCH: 20.3 pg — ABNORMAL LOW (ref 26.0–34.0)
MCHC: 32.3 g/dL (ref 30.0–36.0)
MCV: 62.8 fL — ABNORMAL LOW (ref 78.0–100.0)
MONOS PCT: 9 %
Monocytes Absolute: 0.5 10*3/uL (ref 0.1–1.0)
NEUTROS ABS: 3 10*3/uL (ref 1.7–7.7)
Neutrophils Relative %: 60 %
Platelets: 294 10*3/uL (ref 150–400)
RBC: 4.78 MIL/uL (ref 4.22–5.81)
RDW: 15.8 % — ABNORMAL HIGH (ref 11.5–15.5)
WBC: 5.1 10*3/uL (ref 4.0–10.5)

## 2017-02-15 MED ORDER — SODIUM CHLORIDE 0.9% FLUSH: 10.0000 mL | INTRAVENOUS | Status: DC | PRN

## 2017-02-15 MED ORDER — OXALIPLATIN CHEMO INJECTION 100 MG/20ML
85.0000 mg/m2 | Freq: Once | INTRAVENOUS | Status: AC
  Administered 2017-02-15: 190 mg via INTRAVENOUS
  Filled 2017-02-15: qty 38

## 2017-02-15 MED ORDER — LEUCOVORIN CALCIUM INJECTION 350 MG
407.0000 mg/m2 | Freq: Once | INTRAVENOUS | Status: AC
  Administered 2017-02-15: 900 mg via INTRAVENOUS
  Filled 2017-02-15: qty 35

## 2017-02-15 MED ORDER — FLUOROURACIL CHEMO INJECTION 2.5 GM/50ML
400.0000 mg/m2 | Freq: Once | INTRAVENOUS | Status: AC
  Administered 2017-02-15: 900 mg via INTRAVENOUS
  Filled 2017-02-15: qty 18

## 2017-02-15 MED ORDER — DEXAMETHASONE SODIUM PHOSPHATE 10 MG/ML IJ SOLN
10.0000 mg | Freq: Once | INTRAMUSCULAR | Status: AC
  Administered 2017-02-15: 10 mg via INTRAVENOUS

## 2017-02-15 MED ORDER — HEPARIN SOD (PORK) LOCK FLUSH 100 UNIT/ML IV SOLN: 500.0000 [IU] | Freq: Once | INTRAVENOUS | Status: DC | PRN

## 2017-02-15 MED ORDER — PALONOSETRON HCL INJECTION 0.25 MG/5ML
0.2500 mg | Freq: Once | INTRAVENOUS | Status: AC
  Administered 2017-02-15: 0.25 mg via INTRAVENOUS

## 2017-02-15 MED ORDER — DEXAMETHASONE SODIUM PHOSPHATE 100 MG/10ML IJ SOLN: 10.0000 mg | Freq: Once | INTRAMUSCULAR | Status: DC

## 2017-02-15 MED ORDER — DEXAMETHASONE SODIUM PHOSPHATE 10 MG/ML IJ SOLN
INTRAMUSCULAR | Status: AC
  Filled 2017-02-15: qty 1

## 2017-02-15 MED ORDER — SODIUM CHLORIDE 0.9 % IV SOLN
2400.0000 mg/m2 | INTRAVENOUS | Status: DC
  Administered 2017-02-15: 5300 mg via INTRAVENOUS
  Filled 2017-02-15: qty 100

## 2017-02-15 MED ORDER — DEXTROSE 5 % IV SOLN
Freq: Once | INTRAVENOUS | Status: AC
  Administered 2017-02-15: 10:00:00 via INTRAVENOUS

## 2017-02-15 MED ORDER — PALONOSETRON HCL INJECTION 0.25 MG/5ML
INTRAVENOUS | Status: AC
  Filled 2017-02-15: qty 5

## 2017-02-15 NOTE — Progress Notes (Signed)
infusystem pump teaching done with patient and ambulatory infusion instructions provided for patient. Spouse and patient both verbalize understanding.

## 2017-02-15 NOTE — Progress Notes (Signed)
Patient tolerated infusion without incidence. Patient discharged with ambulatory infusion pump running. Patient in stable condition and discharged to home with wife.  Follow up on Saturday this week to have pump removed.

## 2017-02-15 NOTE — Patient Instructions (Signed)
[facility] Discharge Instructions for Patients Receiving Chemotherapy    If you have a lab appointment with the Cancer Center please come in thru the  Main Entrance and check in at the main information desk   Today you received the following chemotherapy agents: Oxaliplatin, Leucovorin, Flurourocil  To help prevent nausea and vomiting after your treatment, we encourage you to take your nausea medication as prescribed.   If you develop nausea and vomiting, or diarrhea that is not controlled by your medication, call the clinic.  The clinic phone number is [phone]. Office hours are Monday-Friday 8:30am-5:00pm.  BELOW ARE SYMPTOMS THAT SHOULD BE REPORTED IMMEDIATELY:  *FEVER GREATER THAN 101.0 F  *CHILLS WITH OR WITHOUT FEVER  NAUSEA AND VOMITING THAT IS NOT CONTROLLED WITH YOUR NAUSEA MEDICATION  *UNUSUAL SHORTNESS OF BREATH  *UNUSUAL BRUISING OR BLEEDING  TENDERNESS IN MOUTH AND THROAT WITH OR WITHOUT PRESENCE OF ULCERS  *URINARY PROBLEMS  *BOWEL PROBLEMS  UNUSUAL RASH Items with * indicate a potential emergency and should be followed up as soon as possible. If you have an emergency after office hours please contact your primary care physician or go to the nearest emergency department.  Please call the clinic during office hours if you have any questions or concerns.   You may also contact the Patient Navigator at [phone] should you have any questions or need assistance in obtaining follow up care.      Resources For Cancer Patients and their Caregivers ? American Cancer Society: Can assist with transportation, wigs, general needs, runs Look Good Feel Better.        1-888-227-6333 ? Cancer Care: Provides financial assistance, online support groups, medication/co-pay assistance.  1-800-813-HOPE (4673) ? [facility] Assists [place] cancer patients and their families through emotional , educational and  financial support.  336-427-4357 ? [place] DSS Where to apply for food stamps, Medicaid and utility assistance. 336-342-1394 ? [facility]: Transportation to medical appointments. 336-347-2287 ? Social Security Administration: May apply for disability if have a Stage IV cancer. 336-342-7796 1-800-772-1213 ? [place] Aging, Disability and Transit Services: Assists with nutrition, care and transit needs. 336-349-2343          

## 2017-02-16 ENCOUNTER — Ambulatory Visit (HOSPITAL_COMMUNITY): Payer: Self-pay

## 2017-02-16 ENCOUNTER — Telehealth (HOSPITAL_COMMUNITY): Payer: Self-pay

## 2017-02-16 NOTE — Telephone Encounter (Signed)
24 hour follow up callback- patient states he is doing well, no issues. Will come tomorrow for pump to be disconnected. No issues with nausea, diarrhea or vomiting. Patient states he feels good.

## 2017-02-17 ENCOUNTER — Encounter (HOSPITAL_COMMUNITY): Payer: Self-pay

## 2017-02-17 ENCOUNTER — Encounter (HOSPITAL_BASED_OUTPATIENT_CLINIC_OR_DEPARTMENT_OTHER): Payer: Self-pay

## 2017-02-17 VITALS — BP 147/90 | HR 66 | Temp 98.5°F | Resp 18

## 2017-02-17 DIAGNOSIS — Z452 Encounter for adjustment and management of vascular access device: Secondary | ICD-10-CM

## 2017-02-17 DIAGNOSIS — C159 Malignant neoplasm of esophagus, unspecified: Secondary | ICD-10-CM

## 2017-02-17 DIAGNOSIS — C160 Malignant neoplasm of cardia: Secondary | ICD-10-CM

## 2017-02-17 DIAGNOSIS — C779 Secondary and unspecified malignant neoplasm of lymph node, unspecified: Secondary | ICD-10-CM

## 2017-02-17 DIAGNOSIS — C155 Malignant neoplasm of lower third of esophagus: Secondary | ICD-10-CM

## 2017-02-17 MED ORDER — HEPARIN SOD (PORK) LOCK FLUSH 100 UNIT/ML IV SOLN
500.0000 [IU] | Freq: Once | INTRAVENOUS | Status: AC | PRN
  Administered 2017-02-17: 500 [IU]

## 2017-02-17 MED ORDER — SODIUM CHLORIDE 0.9% FLUSH
10.0000 mL | INTRAVENOUS | Status: DC | PRN
  Administered 2017-02-17: 10 mL
  Filled 2017-02-17: qty 10

## 2017-02-17 NOTE — Progress Notes (Signed)
Patient presents with pump running with 2.4 ml remaining, low volume on screen. Patient denies any issues with pump over the past 2 days. Patient denies any side effects or any complaints. Patient's pump removed and port flushed and deaccessed with needle intact.  Patient discharged ambulatory and in stable condition from clinic with wife to home. Patient to follow up as scheduled.

## 2017-02-17 NOTE — Patient Instructions (Signed)
[place] [facility] Discharge Instructions  RECOMMENDATIONS MADE BY THE CONSULTANT AND ANY TEST RESULTS WILL BE SENT TO YOUR REFERRING PHYSICIAN.  You had your infusion pump removed.  Follow up as scheduled.  Thank you for choosing [place] at [facility] to provide your oncology and hematology care.  To afford each patient quality time with our provider, please arrive at least 15 minutes before your scheduled appointment time.    If you have a lab appointment with the [facility] please come in thru the  Main Entrance and check in at the main information desk  You need to re-schedule your appointment should you arrive 10 or more minutes late.  We strive to give you quality time with our providers, and arriving late affects you and other patients whose appointments are after yours.  Also, if you no show three or more times for appointments you may be dismissed from the clinic at the providers discretion.     Again, thank you for choosing [facility].  Our hope is that these requests will decrease the amount of time that you wait before being seen by our physicians.       _____________________________________________________________  Should you have questions after your visit to [facility], please contact our office at [phone] between the hours of 8:30 a.m. and 4:30 p.m.  Voicemails left after 4:30 p.m. will not be returned until the following business day.  For prescription refill requests, have your pharmacy contact our office.       Resources For Cancer Patients and their Caregivers ? American Cancer Society: Can assist with transportation, wigs, general needs, runs Look Good Feel Better.        639 299 8487 ? Cancer Care: Provides financial assistance, online support groups, medication/co-pay assistance.  1-800-813-HOPE (615) 412-5882) ? [facility] Assists [place] cancer  patients and their families through emotional , educational and financial support.  872-333-6627 ? [place] DSS Where to apply for food stamps, Medicaid and utility assistance. 226 378 0888 ? [facility]: Transportation to medical appointments. 904-622-7326 ? Social Security Administration: May apply for disability if have a Stage IV cancer. 502 141 1065 785-524-5239 ? [employer], Disability and Transit Services: Assists with nutrition, care and transit needs. [place] Support Programs: @10RELATIVEDAYS @ > Cancer Support Group  2nd Tuesday of the month 1pm-2pm, Journey Room  > Creative Journey  3rd Tuesday of the month 1130am-1pm, Journey Room  > Look Good Feel Better  1st Wednesday of the month 10am-12 noon, Journey Room (Call [place] to register [phone])

## 2017-02-19 ENCOUNTER — Telehealth (HOSPITAL_COMMUNITY): Payer: Self-pay | Admitting: *Deleted

## 2017-02-19 ENCOUNTER — Ambulatory Visit (HOSPITAL_COMMUNITY): Payer: Self-pay

## 2017-02-19 NOTE — Telephone Encounter (Signed)
Spoke with patient via telephone. Patient reports having some cold sensitivity when drinking cold liquids. Patient reports that he forgot to stay away from cold liquids and quickly remembered when he felt the tingling/stinging in his mouth. Patient reports that the cold sensitivity has resolved and at this time he is not having any side effects. Patient states that he returned to work today and at this time is doing just as he was prior to chemotherapy.  Patient advised to call the clinic should he need help managing any side effects that may arise. Patient agrees and is aware of his follow up appointments.

## 2017-02-21 NOTE — Assessment & Plan Note (Addendum)
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  EGD and biopsy on 01/16/2017 demonstrated malignancy.  Pre-treatment EUS not completed.  NADIR labs today: CBC diff, CMET.  I personally reviewed and went over laboratory results with the patient.  The results are noted within this dictation.  Labs today are stable.    Microcytic anemia is noted.  I have added an anemia panel to his labs next week with treatment to start working up anemia from a peripheral blood aspect.  Pre-treatment labs next week: CBC diff, CMET, anemia panel.  He experienced chemotherapy-induced cold intolerance.  He reports that it has resolved at this time.  He notes some minor abdominal pain.  This may be from Oxaliplatin.  I have given him samples of Nexium to see if that helps.  If it does, then we will prescribe PPI therapy.  He notes minor mouth discomfort.  I have written Rx for Dukes Mouthwash.  Return next week for cycle #2 of treatment.  Return in 3 weeks for follow-up with cycle #3.

## 2017-02-21 NOTE — Progress Notes (Signed)
Maxwell Backbone, FNP [address] [place] [address]  Primary esophageal adenocarcinoma (HCC)  Stomatitis - Plan: Diphenhyd-Hydrocort-Nystatin (FIRST-DUKES MOUTHWASH) SUSP  Microcytic anemia - Plan: Vitamin B12, Folate, Iron and TIBC, Ferritin  CURRENT THERAPY: FOLFOX beginning on 02/15/2017  INTERVAL HISTORY: Maxwell Henderson 51 y.o. 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    Primary esophageal adenocarcinoma ([place])   01/16/2017 Initial Diagnosis    Primary esophageal adenocarcinoma ([place])      01/16/2017 Procedure    EGD by Dr. Barney Drain. Partially obstructing, likely malignant esophageal tumor was found in the distal esophagus. Biopsied with surgical path demonstrating Adenocarcinoma. Injected. Treated with argon plasma coagulation (APC) #3. 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 Biopsied with surgical path demonstrating Adenocarcinoma.      01/19/2017 PET scan    NECK Two FDG avid nodes are seen in the base of the neck on the right. The first is seen on CT image 35, just posterior to the right thyroid lobe and the other is seen on CT image 41 measuring 16 mm in short axis with a maximum SUV of 8.6.  CHEST  The patient's known malignancy involving the distal esophagus and cardia of the stomach is FDG avid with a maximum SUV of 10.4. No definitive metastatic nodes within the chest.  ABDOMEN/PELVIS  FDG avid metastatic adenopathy is seen in the gastrohepatic ligament, paraceliac nodes, posterior to the IVC on image 111, and to the left of the SMA on image 112. The most inferior node is seen in the aortocaval region on image 117. The representative node to the left of the SMA was measured on series 4, image 111 measuring 2.5 by 2.1 cm with a maximum SUV of 7. No other FDG avid disease  in the abdomen or pelvis. Cholelithiasis is identified.      02/02/2017 Procedure    US guided bx of right supraclavicular LN by IR.      02/05/2017 Pathology Results    Lymph node, needle/core biopsy, Right Supraclavicular - METASTATIC POORLY DIFFERENTIATED CARCINOMA.      02/10/2017 Pathology Results    HER2 - NEGATIVE      02/13/2017 Procedure    Port placed by IR      02/15/2017 -  Chemotherapy    FOLFOX         HPI Elements   Location: Distal esophagus and cardia of stomach  Quality: Adenocarcinoma  Severity: Stage IVA  Duration: Dx June 2018  Context: NO EUS completed pre-treatment.  HER2 NEGATIVE  Timing:   Modifying Factors:   Associated Signs & Symptoms: Dysphagia and weight loss   Patient is here today for a nadir check.  He admits to experiencing cold intolerance from oxaliplatin but this has since resolved.  He reports that it lasted 1-2 days only.  He reports nonspecific abdominal discomfort that he rates a 2 out of 10 on the pain scale.  He reports a stable appetite and stable energy level.  He continues to work full-time through therapy.  He notes some discomfort in his mouth denies any pain or lesions.  Review of Systems  Constitutional: Negative.  Negative for chills, fever and weight loss.  HENT: Negative.   Eyes: Negative.   Respiratory: Negative.  Negative for cough.   Cardiovascular: Negative.  Negative for chest pain.  Gastrointestinal: Negative.  Negative for blood in stool, constipation, diarrhea, melena, nausea and vomiting.  Genitourinary: Negative.   Musculoskeletal: Negative.   Skin: Negative.   Neurological: Negative.  Negative for weakness.  Endo/Heme/Allergies: Negative.   Psychiatric/Behavioral: Negative.     Past Medical History:  Diagnosis Date  . Diabetes mellitus without complication ([place])   . Hypertension   . Primary esophageal adenocarcinoma ([place]) 01/29/2017    Past Surgical History:  Procedure Laterality Date  .  ESOPHAGOGASTRODUODENOSCOPY (EGD) WITH PROPOFOL N/A 01/16/2017   Procedure: ESOPHAGOGASTRODUODENOSCOPY (EGD) WITH PROPOFOL;  Surgeon: [name], MD;  [facility];  Service: Endoscopy;  Laterality: N/A;  730   . IR FLUORO GUIDE PORT INSERTION RIGHT  02/13/2017  . IR US GUIDE VASC ACCESS RIGHT  02/13/2017  . lipoma removal    . PORTA CATH INSERTION Right 02/13/2017  . SAVORY DILATION N/A 01/16/2017   Procedure: SAVORY DILATION;  Surgeon: [name], MD;  Location: [facility];  Service: Endoscopy;  Laterality: N/A;    Family History  Problem Relation Age of Onset  . Prostate cancer Father   . Colon cancer Neg Hx   . Colon polyps Neg Hx     Social History   Social History  . Marital status: Married    Spouse name: N/A  . Number of children: N/A  . Years of education: N/A   Social History Main Topics  . Smoking status: Current Some Day Smoker    Packs/day: 0.25    Types: Cigarettes  . Smokeless tobacco: Never Used  . Alcohol use No     Comment: hx heavy alcohol, hasn't drank in 19 yrs  . Drug use: Yes    Types: Marijuana     Comment: history of marijuana   . Sexual activity: Yes   Other Topics Concern  . None   Social History Narrative  . None     PHYSICAL EXAMINATION  ECOG PERFORMANCE STATUS: 0 - Asymptomatic  Vitals:   02/22/17 0949  BP: (!) 144/98  Pulse: 85  Resp: 18  Temp: 99.2 F (37.3 C)    GENERAL:alert, no distress, well nourished, well developed, comfortable, cooperative, smiling and unaccompanied SKIN: skin color, texture, turgor are normal, no rashes or significant lesions HEAD: Normocephalic, No masses, lesions, tenderness or abnormalities EYES: normal, EOMI, Conjunctiva are pink and non-injected EARS: External ears normal OROPHARYNX:lips, buccal mucosa, and tongue normal and mucous membranes are moist  NECK: supple, no adenopathy, trachea midline LYMPH:  no palpable lymphadenopathy BREAST:not examined LUNGS: clear to  auscultation  HEART: regular rate & rhythm, no murmurs and no gallops ABDOMEN:abdomen soft, non-tender and normal bowel sounds BACK: Back symmetric, no curvature., No CVA tenderness EXTREMITIES:less then 2 second capillary refill, no joint deformities, effusion, or inflammation, no edema, no skin discoloration, no clubbing, no cyanosis  NEURO: alert & oriented x 3 with fluent speech, no focal motor/sensory deficits, gait normal   LABORATORY DATA: CBC    Component Value Date/Time   WBC 6.5 02/22/2017 0903   RBC 5.02 02/22/2017 0903   HGB 10.1 (L) 02/22/2017 0903   HCT 31.2 (L) 02/22/2017 0903   PLT 330 02/22/2017 0903   MCV 62.2 (L) 02/22/2017 0903   MCH 20.1 (L) 02/22/2017 0903   MCHC 32.4 02/22/2017 0903   RDW 16.2 (H) 02/22/2017 0903   LYMPHSABS 0.7 02/22/2017 0903   MONOABS 0.1 02/22/2017 0903   EOSABS 0.0 02/22/2017 0903   BASOSABS 0.0 02/22/2017 0814  Chemistry      Component Value Date/Time   NA 135 02/22/2017 0903   K 3.6 02/22/2017 0903   CL 100 (L) 02/22/2017 0903   CO2 27 02/22/2017 0903   BUN 17 02/22/2017 0903   CREATININE 0.95 02/22/2017 0903      Component Value Date/Time   CALCIUM 9.1 02/22/2017 0903   ALKPHOS 57 02/22/2017 0903   AST 19 02/22/2017 0903   ALT 14 (L) 02/22/2017 0903   BILITOT 0.6 02/22/2017 0903        PENDING LABS:   RADIOGRAPHIC STUDIES:  US Biopsy  Result Date: 02/02/2017 INDICATION: 51 year old male with a history of esophageal adenocarcinoma. He has a suspicious right supraclavicular lymph node which is FDG avid on PET-CT and presents for biopsy to confirm metastatic disease. EXAM: Ultrasound-guided biopsy of right supraclavicular lymph node. MEDICATIONS: None. ANESTHESIA/SEDATION: Moderate (conscious) sedation was employed during this procedure. A total of Versed 1 mg and Fentanyl 50 mcg was administered intravenously. Moderate Sedation Time: 10 minutes. The patient's level of consciousness and vital signs were monitored  continuously by radiology nursing throughout the procedure under my direct supervision. FLUOROSCOPY TIME:  Zero COMPLICATIONS: None immediate. PROCEDURE: Informed written consent was obtained from the patient after a thorough discussion of the procedural risks, benefits and alternatives. All questions were addressed. Maximal Sterile Barrier Technique was utilized including caps, mask, sterile gowns, sterile gloves, sterile drape, hand hygiene and skin antiseptic. A timeout was performed prior to the initiation of the procedure. The right supraclavicular station was interrogated with ultrasound. A 2.0 x 1.7 hypoechoic soft tissue mass is present immediately adjacent to the internal jugular vein. Local anesthesia was attained by infiltration with 1% lidocaine. A small dermatotomy was made. Under real-time sonographic guidance, multiple 18 gauge core biopsies were coaxially obtained. Biopsy specimens were placed in formalin and delivered to pathology for further analysis. Post biopsy ultrasound imaging demonstrates no evidence of complication. The patient tolerated the procedure well. IMPRESSION: Successful ultrasound-guided core biopsy of right supraclavicular lymph node. Electronically Signed   By: Jacqulynn Cadet M.D.   On: 02/02/2017 16:39   Ir US Guide Vasc Access Right  Result Date: 02/13/2017 INDICATION: 51 year old with esophageal adenocarcinoma. Port-A-Cath needed for treatment. EXAM: FLUOROSCOPIC AND ULTRASOUND GUIDED PLACEMENT OF A SUBCUTANEOUS PORT COMPARISON:  None. MEDICATIONS: Ancef 2 g; The antibiotic was administered within an appropriate time interval prior to skin puncture. ANESTHESIA/SEDATION: Versed 2.0 mg IV; Fentanyl 100 mcg IV; Moderate Sedation Time:  38 minutes The patient was continuously monitored during the procedure by the interventional radiology nurse under my direct supervision. FLUOROSCOPY TIME:  18 seconds, 3 mGy COMPLICATIONS: None immediate. PROCEDURE: The procedure, risks,  benefits, and alternatives were explained to the patient. Questions regarding the procedure were encouraged and answered. The patient understands and consents to the procedure. Patient was placed supine on the interventional table. Ultrasound confirmed a patent right internal jugular vein. The right chest and neck were cleaned with a skin antiseptic and a sterile drape was placed. Maximal barrier sterile technique was utilized including caps, mask, sterile gowns, sterile gloves, sterile drape, hand hygiene and skin antiseptic. The right neck was anesthetized with 1% lidocaine. Small incision was made in the right neck with a blade. Micropuncture set was placed in the right internal jugular vein with ultrasound guidance. The micropuncture wire was used for measurement purposes. The right chest was anesthetized with 1% lidocaine with epinephrine. #15 blade was used to make an incision and a subcutaneous port pocket was formed.  [place] was assembled. Subcutaneous tunnel was formed with a stiff tunneling device. The port catheter was brought through the subcutaneous tunnel. The port was placed in the subcutaneous pocket. The micropuncture set was exchanged for a peel-away sheath. The catheter was placed through the peel-away sheath and the tip was positioned at the cavoatrial junction. Catheter placement was confirmed with fluoroscopy. The port was accessed and flushed with heparinized saline. The port pocket was closed using two layers of absorbable sutures and Dermabond. The vein skin site was closed using a single layer of absorbable suture and Dermabond. Sterile dressings were applied. Patient tolerated the procedure well without an immediate complication. Ultrasound and fluoroscopic images were taken and saved for this procedure. IMPRESSION: Placement of a subcutaneous port device. Electronically Signed   By: Markus Daft M.D.   On: 02/13/2017 15:10   Ir Fluoro Guide Port Insertion Right  Result Date:  02/13/2017 INDICATION: 51 year old with esophageal adenocarcinoma. Port-A-Cath needed for treatment. EXAM: FLUOROSCOPIC AND ULTRASOUND GUIDED PLACEMENT OF A SUBCUTANEOUS PORT COMPARISON:  None. MEDICATIONS: Ancef 2 g; The antibiotic was administered within an appropriate time interval prior to skin puncture. ANESTHESIA/SEDATION: Versed 2.0 mg IV; Fentanyl 100 mcg IV; Moderate Sedation Time:  38 minutes The patient was continuously monitored during the procedure by the interventional radiology nurse under my direct supervision. FLUOROSCOPY TIME:  18 seconds, 3 mGy COMPLICATIONS: None immediate. PROCEDURE: The procedure, risks, benefits, and alternatives were explained to the patient. Questions regarding the procedure were encouraged and answered. The patient understands and consents to the procedure. Patient was placed supine on the interventional table. Ultrasound confirmed a patent right internal jugular vein. The right chest and neck were cleaned with a skin antiseptic and a sterile drape was placed. Maximal barrier sterile technique was utilized including caps, mask, sterile gowns, sterile gloves, sterile drape, hand hygiene and skin antiseptic. The right neck was anesthetized with 1% lidocaine. Small incision was made in the right neck with a blade. Micropuncture set was placed in the right internal jugular vein with ultrasound guidance. The micropuncture wire was used for measurement purposes. The right chest was anesthetized with 1% lidocaine with epinephrine. #15 blade was used to make an incision and a subcutaneous port pocket was formed. [place] was assembled. Subcutaneous tunnel was formed with a stiff tunneling device. The port catheter was brought through the subcutaneous tunnel. The port was placed in the subcutaneous pocket. The micropuncture set was exchanged for a peel-away sheath. The catheter was placed through the peel-away sheath and the tip was positioned at the cavoatrial junction.  Catheter placement was confirmed with fluoroscopy. The port was accessed and flushed with heparinized saline. The port pocket was closed using two layers of absorbable sutures and Dermabond. The vein skin site was closed using a single layer of absorbable suture and Dermabond. Sterile dressings were applied. Patient tolerated the procedure well without an immediate complication. Ultrasound and fluoroscopic images were taken and saved for this procedure. IMPRESSION: Placement of a subcutaneous port device. Electronically Signed   By: Markus Daft M.D.   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  EGD and biopsy on 01/16/2017 demonstrated malignancy.  Pre-treatment EUS not completed.  NADIR labs today: CBC diff, CMET.  I personally reviewed and went over laboratory results with  the patient.  The results are noted within this dictation.  Labs today are stable.    Microcytic anemia is noted.  I have added an anemia panel to his labs next week with treatment to start working up anemia from a peripheral blood aspect.  Pre-treatment labs next week: CBC diff, CMET, anemia panel.  He experienced chemotherapy-induced cold intolerance.  He reports that it has resolved at this time.  He notes some minor abdominal pain.  This may be from Oxaliplatin.  I have given him samples of Nexium to see if that helps.  If it does, then we will prescribe PPI therapy.  He notes minor mouth discomfort.  I have written Rx for Dukes Mouthwash.  Return next week for cycle #2 of treatment.  Return in 3 weeks for follow-up with cycle #3.   ORDERS PLACED FOR THIS ENCOUNTER: Orders Placed This Encounter  Procedures  . Vitamin B12  . Folate  . Iron and TIBC  . Ferritin    MEDICATIONS PRESCRIBED THIS ENCOUNTER: Meds ordered  this encounter  Medications  . Diphenhyd-Hydrocort-Nystatin (FIRST-DUKES MOUTHWASH) SUSP    Sig: Use as directed 5 mLs in the mouth or throat 4 (four) times daily as needed.    Dispense:  300 mL    Refill:  1    Order Specific Question:   Supervising Provider    Answer:   Brunetta Genera [[id number]]    THERAPY PLAN:  Continue with palliative treatment as planned.  All questions were answered. The patient knows to call the clinic with any problems, questions or concerns. We can certainly see the patient much sooner if necessary.  Patient and plan discussed with Dr. Twana First and she is in agreement with the aforementioned.   This note is electronically signed by: Doy Mince 02/22/2017 1:59 PM

## 2017-02-22 ENCOUNTER — Encounter (HOSPITAL_COMMUNITY): Payer: Self-pay | Attending: Oncology

## 2017-02-22 ENCOUNTER — Encounter (HOSPITAL_BASED_OUTPATIENT_CLINIC_OR_DEPARTMENT_OTHER): Payer: Self-pay | Admitting: Oncology

## 2017-02-22 ENCOUNTER — Encounter (HOSPITAL_COMMUNITY): Payer: Self-pay | Admitting: Oncology

## 2017-02-22 VITALS — BP 144/98 | HR 85 | Temp 99.2°F | Resp 18 | Wt 206.7 lb

## 2017-02-22 DIAGNOSIS — D509 Iron deficiency anemia, unspecified: Secondary | ICD-10-CM

## 2017-02-22 DIAGNOSIS — C159 Malignant neoplasm of esophagus, unspecified: Secondary | ICD-10-CM

## 2017-02-22 DIAGNOSIS — C779 Secondary and unspecified malignant neoplasm of lymph node, unspecified: Secondary | ICD-10-CM

## 2017-02-22 DIAGNOSIS — C160 Malignant neoplasm of cardia: Secondary | ICD-10-CM

## 2017-02-22 DIAGNOSIS — C155 Malignant neoplasm of lower third of esophagus: Secondary | ICD-10-CM

## 2017-02-22 DIAGNOSIS — K121 Other forms of stomatitis: Secondary | ICD-10-CM

## 2017-02-22 DIAGNOSIS — K1379 Other lesions of oral mucosa: Secondary | ICD-10-CM

## 2017-02-22 DIAGNOSIS — R109 Unspecified abdominal pain: Secondary | ICD-10-CM

## 2017-02-22 LAB — COMPREHENSIVE METABOLIC PANEL
ALK PHOS: 57 U/L (ref 38–126)
ALT: 14 U/L — AB (ref 17–63)
ANION GAP: 8 (ref 5–15)
AST: 19 U/L (ref 15–41)
Albumin: 4.1 g/dL (ref 3.5–5.0)
BILIRUBIN TOTAL: 0.6 mg/dL (ref 0.3–1.2)
BUN: 17 mg/dL (ref 6–20)
CALCIUM: 9.1 mg/dL (ref 8.9–10.3)
CO2: 27 mmol/L (ref 22–32)
CREATININE: 0.95 mg/dL (ref 0.61–1.24)
Chloride: 100 mmol/L — ABNORMAL LOW (ref 101–111)
GFR calc non Af Amer: >60 mL/min (ref >60)
GLUCOSE: 159 mg/dL — AB (ref 65–99)
Potassium: 3.6 mmol/L (ref 3.5–5.1)
Sodium: 135 mmol/L (ref 135–145)
TOTAL PROTEIN: 7.5 g/dL (ref 6.5–8.1)

## 2017-02-22 LAB — CBC WITH DIFFERENTIAL/PLATELET
BASOS PCT: 0 %
Basophils Absolute: 0 10*3/uL (ref 0.0–0.1)
EOS PCT: 0 %
Eosinophils Absolute: 0 10*3/uL (ref 0.0–0.7)
HEMATOCRIT: 31.2 % — AB (ref 39.0–52.0)
HEMOGLOBIN: 10.1 g/dL — AB (ref 13.0–17.0)
LYMPHS PCT: 11 %
Lymphs Abs: 0.7 10*3/uL (ref 0.7–4.0)
MCH: 20.1 pg — ABNORMAL LOW (ref 26.0–34.0)
MCHC: 32.4 g/dL (ref 30.0–36.0)
MCV: 62.2 fL — ABNORMAL LOW (ref 78.0–100.0)
MONOS PCT: 1 %
Monocytes Absolute: 0.1 10*3/uL (ref 0.1–1.0)
NEUTROS PCT: 88 %
Neutro Abs: 5.7 10*3/uL (ref 1.7–7.7)
Platelets: 330 10*3/uL (ref 150–400)
RBC: 5.02 MIL/uL (ref 4.22–5.81)
RDW: 16.2 % — ABNORMAL HIGH (ref 11.5–15.5)
WBC: 6.5 10*3/uL (ref 4.0–10.5)

## 2017-02-22 MED ORDER — FIRST-DUKES MOUTHWASH MT SUSP: 5.0000 mL | Freq: Four times a day (QID) | OROMUCOSAL | 1 refills | Status: DC | PRN

## 2017-02-22 NOTE — Patient Instructions (Signed)
[place] at [facility] Discharge Instructions  RECOMMENDATIONS MADE BY THE CONSULTANT AND ANY TEST RESULTS WILL BE SENT TO YOUR REFERRING PHYSICIAN.  You were seen today by Maxwell Henderson You will get lab work and treatment next week We will follow up with you in 3 weeks with treatment We have given you a prescription for Duke's magic mouth wash. We have also given you a sample of Nexium.   Thank you for choosing [place] at [facility] to provide your oncology and hematology care.  To afford each patient quality time with our provider, please arrive at least 15 minutes before your scheduled appointment time.    If you have a lab appointment with the [facility] please come in thru the  Main Entrance and check in at the main information desk  You need to re-schedule your appointment should you arrive 10 or more minutes late.  We strive to give you quality time with our providers, and arriving late affects you and other patients whose appointments are after yours.  Also, if you no show three or more times for appointments you may be dismissed from the clinic at the providers discretion.     Again, thank you for choosing [facility].  Our hope is that these requests will decrease the amount of time that you wait before being seen by our physicians.       _____________________________________________________________  Should you have questions after your visit to [facility], please contact our office at [phone] between the hours of 8:30 a.m. and 4:30 p.m.  Voicemails left after 4:30 p.m. will not be returned until the following business day.  For prescription refill requests, have your pharmacy contact our office.       Resources For Cancer Patients and their Caregivers ? American Cancer Society: Can assist with transportation, wigs, general needs, runs Look Good Feel Better.         (445)279-7312 ? Cancer Care: Provides financial assistance, online support groups, medication/co-pay assistance.  1-800-813-HOPE 215-331-5748) ? [place] Assists [place] cancer patients and their families through emotional , educational and financial support.  769-141-5556 ? [place] DSS Where to apply for food stamps, Medicaid and utility assistance. 480-754-6115 ? [employer]: Transportation to medical appointments. (289)317-2945 ? Social Security Administration: May apply for disability if have a Stage IV cancer. 231-551-9372 (904)579-3559 ? [employer], Disability and Transit Services: Assists with nutrition, care and transit needs. [place] Support Programs: @10RELATIVEDAYS @ > Cancer Support Group  2nd Tuesday of the month 1pm-2pm, Journey Room  > Creative Journey  3rd Tuesday of the month 1130am-1pm, Journey Room  > Look Good Feel Better  1st Wednesday of the month 10am-12 noon, Journey Room (Call [place] to register [phone])

## 2017-02-28 ENCOUNTER — Encounter (HOSPITAL_COMMUNITY): Payer: Self-pay

## 2017-02-28 ENCOUNTER — Encounter (HOSPITAL_BASED_OUTPATIENT_CLINIC_OR_DEPARTMENT_OTHER): Payer: Self-pay

## 2017-02-28 ENCOUNTER — Encounter (HOSPITAL_COMMUNITY): Payer: Self-pay | Admitting: Oncology

## 2017-02-28 ENCOUNTER — Other Ambulatory Visit (HOSPITAL_COMMUNITY): Payer: Self-pay | Admitting: Oncology

## 2017-02-28 ENCOUNTER — Ambulatory Visit (HOSPITAL_COMMUNITY): Payer: Self-pay

## 2017-02-28 VITALS — BP 145/94 | HR 60 | Temp 98.0°F | Resp 18 | Wt 202.0 lb

## 2017-02-28 DIAGNOSIS — D509 Iron deficiency anemia, unspecified: Secondary | ICD-10-CM

## 2017-02-28 DIAGNOSIS — Z5111 Encounter for antineoplastic chemotherapy: Secondary | ICD-10-CM

## 2017-02-28 DIAGNOSIS — C160 Malignant neoplasm of cardia: Secondary | ICD-10-CM

## 2017-02-28 DIAGNOSIS — D500 Iron deficiency anemia secondary to blood loss (chronic): Secondary | ICD-10-CM

## 2017-02-28 DIAGNOSIS — C155 Malignant neoplasm of lower third of esophagus: Secondary | ICD-10-CM

## 2017-02-28 DIAGNOSIS — C779 Secondary and unspecified malignant neoplasm of lymph node, unspecified: Secondary | ICD-10-CM

## 2017-02-28 DIAGNOSIS — C159 Malignant neoplasm of esophagus, unspecified: Secondary | ICD-10-CM

## 2017-02-28 HISTORY — DX: Iron deficiency anemia secondary to blood loss (chronic): D50.0

## 2017-02-28 LAB — CBC WITH DIFFERENTIAL/PLATELET
BASOS ABS: 0 10*3/uL (ref 0.0–0.1)
Basophils Relative: 0 %
EOS ABS: 0 10*3/uL (ref 0.0–0.7)
Eosinophils Relative: 0 %
HCT: 31 % — ABNORMAL LOW (ref 39.0–52.0)
Hemoglobin: 10 g/dL — ABNORMAL LOW (ref 13.0–17.0)
LYMPHS ABS: 0.5 10*3/uL — AB (ref 0.7–4.0)
LYMPHS PCT: 5 %
MCH: 20.2 pg — ABNORMAL LOW (ref 26.0–34.0)
MCHC: 32.3 g/dL (ref 30.0–36.0)
MCV: 62.8 fL — ABNORMAL LOW (ref 78.0–100.0)
Monocytes Absolute: 0.2 10*3/uL (ref 0.1–1.0)
Monocytes Relative: 2 %
NEUTROS ABS: 8.5 10*3/uL — AB (ref 1.7–7.7)
Neutrophils Relative %: 93 %
Platelets: 312 10*3/uL (ref 150–400)
RBC: 4.94 MIL/uL (ref 4.22–5.81)
RDW: 17.3 % — AB (ref 11.5–15.5)
WBC: 9.2 10*3/uL (ref 4.0–10.5)

## 2017-02-28 LAB — FERRITIN: Ferritin: 9 ng/mL — ABNORMAL LOW (ref 24–336)

## 2017-02-28 LAB — IRON AND TIBC
IRON: 38 ug/dL — AB (ref 45–182)
SATURATION RATIOS: 9 % — AB (ref 17.9–39.5)
TIBC: 444 ug/dL (ref 250–450)
UIBC: 406 ug/dL

## 2017-02-28 LAB — VITAMIN B12: Vitamin B-12: 631 pg/mL (ref 180–914)

## 2017-02-28 LAB — COMPREHENSIVE METABOLIC PANEL
ALBUMIN: 4.1 g/dL (ref 3.5–5.0)
ALK PHOS: 68 U/L (ref 38–126)
ALT: 50 U/L (ref 17–63)
ANION GAP: 10 (ref 5–15)
AST: 26 U/L (ref 15–41)
BILIRUBIN TOTAL: 0.9 mg/dL (ref 0.3–1.2)
BUN: 18 mg/dL (ref 6–20)
CALCIUM: 9 mg/dL (ref 8.9–10.3)
CO2: 26 mmol/L (ref 22–32)
Chloride: 98 mmol/L — ABNORMAL LOW (ref 101–111)
Creatinine, Ser: 0.99 mg/dL (ref 0.61–1.24)
GFR calc Af Amer: >60 mL/min (ref >60)
GLUCOSE: 184 mg/dL — AB (ref 65–99)
Potassium: 3.8 mmol/L (ref 3.5–5.1)
Sodium: 134 mmol/L — ABNORMAL LOW (ref 135–145)
TOTAL PROTEIN: 7 g/dL (ref 6.5–8.1)

## 2017-02-28 LAB — FOLATE: FOLATE: 21 ng/mL (ref >5.9)

## 2017-02-28 MED ORDER — OXALIPLATIN CHEMO INJECTION 100 MG/20ML
85.0000 mg/m2 | Freq: Once | INTRAVENOUS | Status: AC
  Administered 2017-02-28: 190 mg via INTRAVENOUS
  Filled 2017-02-28: qty 38

## 2017-02-28 MED ORDER — PALONOSETRON HCL INJECTION 0.25 MG/5ML
0.2500 mg | Freq: Once | INTRAVENOUS | Status: AC
  Administered 2017-02-28: 0.25 mg via INTRAVENOUS
  Filled 2017-02-28: qty 5

## 2017-02-28 MED ORDER — HEPARIN SOD (PORK) LOCK FLUSH 100 UNIT/ML IV SOLN
500.0000 [IU] | Freq: Once | INTRAVENOUS | Status: DC | PRN
  Filled 2017-02-28: qty 5

## 2017-02-28 MED ORDER — DEXAMETHASONE SODIUM PHOSPHATE 10 MG/ML IJ SOLN
10.0000 mg | Freq: Once | INTRAMUSCULAR | Status: AC
  Administered 2017-02-28: 10 mg via INTRAVENOUS
  Filled 2017-02-28: qty 1

## 2017-02-28 MED ORDER — SODIUM CHLORIDE 0.9 % IV SOLN
2400.0000 mg/m2 | INTRAVENOUS | Status: DC
  Administered 2017-02-28: 5300 mg via INTRAVENOUS
  Filled 2017-02-28: qty 106

## 2017-02-28 MED ORDER — DEXTROSE 5 % IV SOLN
Freq: Once | INTRAVENOUS | Status: AC
  Administered 2017-02-28: 10:00:00 via INTRAVENOUS

## 2017-02-28 MED ORDER — FLUOROURACIL CHEMO INJECTION 2.5 GM/50ML
400.0000 mg/m2 | Freq: Once | INTRAVENOUS | Status: AC
  Administered 2017-02-28: 900 mg via INTRAVENOUS
  Filled 2017-02-28: qty 18

## 2017-02-28 MED ORDER — LEUCOVORIN CALCIUM INJECTION 350 MG
407.0000 mg/m2 | Freq: Once | INTRAVENOUS | Status: AC
  Administered 2017-02-28: 900 mg via INTRAVENOUS
  Filled 2017-02-28: qty 10

## 2017-02-28 NOTE — Progress Notes (Signed)
Tolerated tx w/o adverse reaction.  Alert, in no distress.  VSS.  Discharged ambulatory in c/o spouse.  

## 2017-03-02 ENCOUNTER — Encounter (HOSPITAL_BASED_OUTPATIENT_CLINIC_OR_DEPARTMENT_OTHER): Payer: Self-pay

## 2017-03-02 ENCOUNTER — Encounter (HOSPITAL_COMMUNITY): Payer: Self-pay

## 2017-03-02 VITALS — BP 124/75 | HR 78 | Temp 98.3°F | Resp 18

## 2017-03-02 DIAGNOSIS — D509 Iron deficiency anemia, unspecified: Secondary | ICD-10-CM

## 2017-03-02 DIAGNOSIS — C159 Malignant neoplasm of esophagus, unspecified: Secondary | ICD-10-CM

## 2017-03-02 DIAGNOSIS — D500 Iron deficiency anemia secondary to blood loss (chronic): Secondary | ICD-10-CM

## 2017-03-02 MED ORDER — SODIUM CHLORIDE 0.9% FLUSH
10.0000 mL | INTRAVENOUS | Status: DC | PRN
  Administered 2017-03-02: 10 mL
  Filled 2017-03-02: qty 10

## 2017-03-02 MED ORDER — HEPARIN SOD (PORK) LOCK FLUSH 100 UNIT/ML IV SOLN
250.0000 [IU] | Freq: Once | INTRAVENOUS | Status: DC | PRN
  Filled 2017-03-02: qty 5

## 2017-03-02 MED ORDER — SODIUM CHLORIDE 0.9 % IV SOLN
510.0000 mg | Freq: Once | INTRAVENOUS | Status: AC
  Administered 2017-03-02: 510 mg via INTRAVENOUS
  Filled 2017-03-02: qty 17

## 2017-03-02 MED ORDER — SODIUM CHLORIDE 0.9 % IV SOLN
Freq: Once | INTRAVENOUS | Status: AC
  Administered 2017-03-02: 11:00:00 via INTRAVENOUS

## 2017-03-02 MED ORDER — SODIUM CHLORIDE 0.9% FLUSH
10.0000 mL | INTRAVENOUS | Status: DC | PRN
  Filled 2017-03-02: qty 10

## 2017-03-02 MED ORDER — HEPARIN SOD (PORK) LOCK FLUSH 100 UNIT/ML IV SOLN
500.0000 [IU] | Freq: Once | INTRAVENOUS | Status: AC | PRN
  Administered 2017-03-02: 500 [IU]

## 2017-03-02 NOTE — Progress Notes (Signed)
Maxwell Henderson tolerated 5FU pump discontinuation and Feraheme infusion well without complaints or incident. VSS Pt discharged self ambulatory in satisfactory condition

## 2017-03-02 NOTE — Patient Instructions (Signed)
[place] at [facility] Discharge Instructions  RECOMMENDATIONS MADE BY THE CONSULTANT AND ANY TEST RESULTS WILL BE SENT TO YOUR REFERRING PHYSICIAN.  Received Feraheme infusion after 5FU pump discontinued today. Follow-up as scheduled. Call clinic for any questions or concerns  Thank you for choosing [place] at [facility] to provide your oncology and hematology care.  To afford each patient quality time with our provider, please arrive at least 15 minutes before your scheduled appointment time.    If you have a lab appointment with the [facility] please come in thru the  Main Entrance and check in at the main information desk  You need to re-schedule your appointment should you arrive 10 or more minutes late.  We strive to give you quality time with our providers, and arriving late affects you and other patients whose appointments are after yours.  Also, if you no show three or more times for appointments you may be dismissed from the clinic at the providers discretion.     Again, thank you for choosing [facility].  Our hope is that these requests will decrease the amount of time that you wait before being seen by our physicians.       _____________________________________________________________  Should you have questions after your visit to [facility], please contact our office at [phone] between the hours of 8:30 a.m. and 4:30 p.m.  Voicemails left after 4:30 p.m. will not be returned until the following business day.  For prescription refill requests, have your pharmacy contact our office.       Resources For Cancer Patients and their Caregivers ? American Cancer Society: Can assist with transportation, wigs, general needs, runs Look Good Feel Better.        409-691-2751 ? Cancer Care: Provides financial assistance, online support groups, medication/co-pay assistance.  1-800-813-HOPE  906-659-3639) ? [place] Assists [place] cancer patients and their families through emotional , educational and financial support.  3075626872 ? [place] DSS Where to apply for food stamps, Medicaid and utility assistance. (559)408-3261 ? [employer]: Transportation to medical appointments. (317)835-1838 ? Social Security Administration: May apply for disability if have a Stage IV cancer. (380)842-7910 727 670 2886 ? [employer], Disability and Transit Services: Assists with nutrition, care and transit needs. [place] Support Programs: @10RELATIVEDAYS @ > Cancer Support Group  2nd Tuesday of the month 1pm-2pm, Journey Room  > Creative Journey  3rd Tuesday of the month 1130am-1pm, Journey Room  > Look Good Feel Better  1st Wednesday of the month 10am-12 noon, Journey Room (Call [place] to register [phone])

## 2017-03-09 ENCOUNTER — Encounter (HOSPITAL_COMMUNITY): Payer: Self-pay

## 2017-03-09 ENCOUNTER — Encounter (HOSPITAL_BASED_OUTPATIENT_CLINIC_OR_DEPARTMENT_OTHER): Payer: Self-pay

## 2017-03-09 ENCOUNTER — Ambulatory Visit (HOSPITAL_COMMUNITY): Payer: Self-pay

## 2017-03-09 VITALS — BP 119/72 | HR 82 | Temp 97.8°F | Resp 18

## 2017-03-09 DIAGNOSIS — D500 Iron deficiency anemia secondary to blood loss (chronic): Secondary | ICD-10-CM

## 2017-03-09 DIAGNOSIS — D509 Iron deficiency anemia, unspecified: Secondary | ICD-10-CM

## 2017-03-09 MED ORDER — FERUMOXYTOL INJECTION 510 MG/17 ML
510.0000 mg | Freq: Once | INTRAVENOUS | Status: AC
  Administered 2017-03-09: 510 mg via INTRAVENOUS
  Filled 2017-03-09: qty 17

## 2017-03-09 MED ORDER — HEPARIN SOD (PORK) LOCK FLUSH 100 UNIT/ML IV SOLN
500.0000 [IU] | Freq: Once | INTRAVENOUS | Status: AC
  Administered 2017-03-09: 500 [IU] via INTRAVENOUS

## 2017-03-09 MED ORDER — HEPARIN SOD (PORK) LOCK FLUSH 100 UNIT/ML IV SOLN
250.0000 [IU] | Freq: Once | INTRAVENOUS | Status: DC | PRN
  Filled 2017-03-09: qty 5

## 2017-03-09 MED ORDER — SODIUM CHLORIDE 0.9% FLUSH
10.0000 mL | INTRAVENOUS | Status: DC | PRN
  Administered 2017-03-09: 10 mL
  Filled 2017-03-09: qty 10

## 2017-03-09 MED ORDER — SODIUM CHLORIDE 0.9 % IV SOLN
Freq: Once | INTRAVENOUS | Status: AC
  Administered 2017-03-09: 09:00:00 via INTRAVENOUS

## 2017-03-09 NOTE — Progress Notes (Signed)
Feraheme 510 mg given today per orders. Patient tolerated it well without problems. Vitals stable and discharged home from clinic ambulatory. Follow up as scheduled.

## 2017-03-14 ENCOUNTER — Encounter (HOSPITAL_COMMUNITY): Payer: Self-pay | Attending: Oncology | Admitting: Adult Health

## 2017-03-14 ENCOUNTER — Encounter (HOSPITAL_BASED_OUTPATIENT_CLINIC_OR_DEPARTMENT_OTHER): Payer: Self-pay

## 2017-03-14 ENCOUNTER — Encounter (HOSPITAL_COMMUNITY): Payer: Self-pay | Admitting: Adult Health

## 2017-03-14 VITALS — BP 151/93 | HR 67 | Temp 98.0°F | Resp 18 | Wt 207.8 lb

## 2017-03-14 DIAGNOSIS — C155 Malignant neoplasm of lower third of esophagus: Secondary | ICD-10-CM

## 2017-03-14 DIAGNOSIS — E876 Hypokalemia: Secondary | ICD-10-CM | POA: Insufficient documentation

## 2017-03-14 DIAGNOSIS — G4700 Insomnia, unspecified: Secondary | ICD-10-CM

## 2017-03-14 DIAGNOSIS — Z720 Tobacco use: Secondary | ICD-10-CM

## 2017-03-14 DIAGNOSIS — D509 Iron deficiency anemia, unspecified: Secondary | ICD-10-CM | POA: Insufficient documentation

## 2017-03-14 DIAGNOSIS — C160 Malignant neoplasm of cardia: Secondary | ICD-10-CM

## 2017-03-14 DIAGNOSIS — Z5111 Encounter for antineoplastic chemotherapy: Secondary | ICD-10-CM

## 2017-03-14 DIAGNOSIS — C159 Malignant neoplasm of esophagus, unspecified: Secondary | ICD-10-CM | POA: Insufficient documentation

## 2017-03-14 DIAGNOSIS — C779 Secondary and unspecified malignant neoplasm of lymph node, unspecified: Secondary | ICD-10-CM

## 2017-03-14 DIAGNOSIS — G4701 Insomnia due to medical condition: Secondary | ICD-10-CM | POA: Insufficient documentation

## 2017-03-14 DIAGNOSIS — R202 Paresthesia of skin: Secondary | ICD-10-CM

## 2017-03-14 LAB — COMPREHENSIVE METABOLIC PANEL
ALT: 38 U/L (ref 17–63)
ANION GAP: 8 (ref 5–15)
AST: 21 U/L (ref 15–41)
Albumin: 3.6 g/dL (ref 3.5–5.0)
Alkaline Phosphatase: 76 U/L (ref 38–126)
BILIRUBIN TOTAL: 0.8 mg/dL (ref 0.3–1.2)
BUN: 8 mg/dL (ref 6–20)
CO2: 26 mmol/L (ref 22–32)
Calcium: 8.8 mg/dL — ABNORMAL LOW (ref 8.9–10.3)
Chloride: 104 mmol/L (ref 101–111)
Creatinine, Ser: 0.84 mg/dL (ref 0.61–1.24)
GFR calc Af Amer: >60 mL/min (ref >60)
Glucose, Bld: 122 mg/dL — ABNORMAL HIGH (ref 65–99)
POTASSIUM: 3.6 mmol/L (ref 3.5–5.1)
Sodium: 138 mmol/L (ref 135–145)
TOTAL PROTEIN: 6.5 g/dL (ref 6.5–8.1)

## 2017-03-14 LAB — CBC WITH DIFFERENTIAL/PLATELET
Basophils Absolute: 0 10*3/uL (ref 0.0–0.1)
Basophils Relative: 0 %
EOS PCT: 5 %
Eosinophils Absolute: 0.3 10*3/uL (ref 0.0–0.7)
HEMATOCRIT: 30.9 % — AB (ref 39.0–52.0)
Hemoglobin: 10 g/dL — ABNORMAL LOW (ref 13.0–17.0)
LYMPHS ABS: 1.5 10*3/uL (ref 0.7–4.0)
Lymphocytes Relative: 26 %
MCH: 20.9 pg — AB (ref 26.0–34.0)
MCHC: 32.4 g/dL (ref 30.0–36.0)
MCV: 64.5 fL — AB (ref 78.0–100.0)
MONOS PCT: 6 %
Monocytes Absolute: 0.3 10*3/uL (ref 0.1–1.0)
NEUTROS ABS: 3.7 10*3/uL (ref 1.7–7.7)
Neutrophils Relative %: 63 %
Platelets: 185 10*3/uL (ref 150–400)
RBC: 4.79 MIL/uL (ref 4.22–5.81)
RDW: 21.6 % — AB (ref 11.5–15.5)
WBC: 5.8 10*3/uL (ref 4.0–10.5)

## 2017-03-14 MED ORDER — DEXAMETHASONE SODIUM PHOSPHATE 10 MG/ML IJ SOLN
INTRAMUSCULAR | Status: AC
  Filled 2017-03-14: qty 1

## 2017-03-14 MED ORDER — PALONOSETRON HCL INJECTION 0.25 MG/5ML
0.2500 mg | Freq: Once | INTRAVENOUS | Status: AC
  Administered 2017-03-14: 0.25 mg via INTRAVENOUS

## 2017-03-14 MED ORDER — HEPARIN SOD (PORK) LOCK FLUSH 100 UNIT/ML IV SOLN: 500.0000 [IU] | Freq: Once | INTRAVENOUS | Status: DC | PRN

## 2017-03-14 MED ORDER — OXALIPLATIN CHEMO INJECTION 100 MG/20ML
85.0000 mg/m2 | Freq: Once | INTRAVENOUS | Status: AC
  Administered 2017-03-14: 190 mg via INTRAVENOUS
  Filled 2017-03-14: qty 38

## 2017-03-14 MED ORDER — SODIUM CHLORIDE 0.9% FLUSH
10.0000 mL | INTRAVENOUS | Status: DC | PRN
  Administered 2017-03-14: 10 mL
  Filled 2017-03-14: qty 10

## 2017-03-14 MED ORDER — LEUCOVORIN CALCIUM INJECTION 350 MG
407.0000 mg/m2 | Freq: Once | INTRAVENOUS | Status: AC
  Administered 2017-03-14: 900 mg via INTRAVENOUS
  Filled 2017-03-14: qty 35

## 2017-03-14 MED ORDER — DEXAMETHASONE SODIUM PHOSPHATE 10 MG/ML IJ SOLN
10.0000 mg | Freq: Once | INTRAMUSCULAR | Status: AC
  Administered 2017-03-14: 10 mg via INTRAVENOUS

## 2017-03-14 MED ORDER — TEMAZEPAM 30 MG PO CAPS: 30.0000 mg | ORAL_CAPSULE | Freq: Every evening | ORAL | 1 refills | Status: DC | PRN

## 2017-03-14 MED ORDER — FLUOROURACIL CHEMO INJECTION 2.5 GM/50ML
400.0000 mg/m2 | Freq: Once | INTRAVENOUS | Status: AC
  Administered 2017-03-14: 900 mg via INTRAVENOUS
  Filled 2017-03-14: qty 18

## 2017-03-14 MED ORDER — SODIUM CHLORIDE 0.9 % IV SOLN
2400.0000 mg/m2 | INTRAVENOUS | Status: DC
  Administered 2017-03-14: 5300 mg via INTRAVENOUS
  Filled 2017-03-14: qty 66

## 2017-03-14 MED ORDER — DEXAMETHASONE 4 MG PO TABS: 8.0000 mg | ORAL_TABLET | Freq: Every day | ORAL | 1 refills | Status: DC

## 2017-03-14 MED ORDER — PALONOSETRON HCL INJECTION 0.25 MG/5ML
INTRAVENOUS | Status: AC
  Filled 2017-03-14: qty 5

## 2017-03-14 MED ORDER — DEXTROSE 5 % IV SOLN
Freq: Once | INTRAVENOUS | Status: AC
  Administered 2017-03-14: 11:00:00 via INTRAVENOUS

## 2017-03-14 NOTE — Patient Instructions (Addendum)
[facility] Discharge Instructions for Patients Receiving Chemotherapy    If you have a lab appointment with the Cancer Center please come in thru the  Main Entrance and check in at the main information desk   Today you received the following chemotherapy agents: Oxaliplatin, Leucovorin, Flurourocil  To help prevent nausea and vomiting after your treatment, we encourage you to take your nausea medication as prescribed.   If you develop nausea and vomiting, or diarrhea that is not controlled by your medication, call the clinic.  The clinic phone number is [phone]. Office hours are Monday-Friday 8:30am-5:00pm.  BELOW ARE SYMPTOMS THAT SHOULD BE REPORTED IMMEDIATELY:  *FEVER GREATER THAN 101.0 F  *CHILLS WITH OR WITHOUT FEVER  NAUSEA AND VOMITING THAT IS NOT CONTROLLED WITH YOUR NAUSEA MEDICATION  *UNUSUAL SHORTNESS OF BREATH  *UNUSUAL BRUISING OR BLEEDING  TENDERNESS IN MOUTH AND THROAT WITH OR WITHOUT PRESENCE OF ULCERS  *URINARY PROBLEMS  *BOWEL PROBLEMS  UNUSUAL RASH Items with * indicate a potential emergency and should be followed up as soon as possible. If you have an emergency after office hours please contact your primary care physician or go to the nearest emergency department.  Please call the clinic during office hours if you have any questions or concerns.   You may also contact the Patient Navigator at [phone] should you have any questions or need assistance in obtaining follow up care.      Resources For Cancer Patients and their Caregivers ? American Cancer Society: Can assist with transportation, wigs, general needs, runs Look Good Feel Better.        1-888-227-6333 ? Cancer Care: Provides financial assistance, online support groups, medication/co-pay assistance.  1-800-813-HOPE (4673) ? [facility] Assists [place] cancer patients and their families through emotional , educational and  financial support.  336-427-4357 ? [place] DSS Where to apply for food stamps, Medicaid and utility assistance. 336-342-1394 ? RCATS: Transportation to medical appointments. 336-347-2287 ? Social Security Administration: May apply for disability if have a Stage IV cancer. 336-342-7796 1-800-772-1213 ? [place] Aging, Disability and Transit Services: Assists with nutrition, care and transit needs. 336-349-2343          

## 2017-03-14 NOTE — Progress Notes (Signed)
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 Barney Drain. Partially obstructing, likely malignant esophageal tumor was found in the distal esophagus. Biopsied with surgical path demonstrating Adenocarcinoma. Injected. Treated with argon plasma coagulation (APC) #3. 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 Biopsied with surgical path demonstrating Adenocarcinoma.      01/19/2017 PET scan    NECK Two FDG avid nodes are seen in the base of the neck on the right. The first is seen on CT image 35, just posterior to the right thyroid lobe and the other is seen on CT image 41 measuring 16 mm in short axis with a maximum SUV of 8.6.  CHEST  The patient's known malignancy involving the distal esophagus and cardia of the stomach is FDG avid with a maximum SUV of 10.4. No definitive metastatic nodes within the chest.  ABDOMEN/PELVIS  FDG avid metastatic adenopathy is seen in the gastrohepatic ligament, paraceliac nodes, posterior to the IVC on image 111, and to the left of the SMA on image 112. The most inferior node is seen in the aortocaval region on image 117. The representative node to the left of the SMA was measured on series 4, image 111 measuring 2.5 by 2.1 cm with a maximum SUV of 7. No other FDG avid disease in the abdomen or pelvis. Cholelithiasis is identified.      02/02/2017 Procedure    US guided bx of right supraclavicular LN by IR.      02/05/2017  Pathology Results    Lymph node, needle/core biopsy, Right Supraclavicular - METASTATIC POORLY DIFFERENTIATED CARCINOMA.      02/10/2017 Pathology Results    HER2 - NEGATIVE      02/13/2017 Procedure    Port placed by IR      02/15/2017 -  Chemotherapy    FOLFOX         INTERVAL HISTORY:  Maxwell Henderson 51 y.o. male returns for routine follow-up for esophageal cancer.   Due for cycle #3 FOLFOX today.    Overall, he tells me that he has been feeling "pretty good", except feeling a bit more tired.  Energy levels are 25%; he attributes some of this to having a hard time sleeping at night "because I come home from work and sit on the couch, fall asleep at about 5:30 pm and don't wake up until 1 am sometimes."  He is asking for something to help him sleep that he can use as needed.  He also tells me that he accidentally took his Decadron incorrectly with last cycle of chemo; he took 2 tabs of Decadron daily since his last cycle (rather than pre- and post-chemo only).  Therefore, he requests refill of Decadron and understands how to take the medication now.   Reports intermittent (R) big toe pain; states that he has a h/o gout and is not sure if that is flared up or not. States that when he lost weight several years ago, his gout pain resolved.  He is able to wear shoes and socks without too much difficulty. He takes morphine PRN for pain, "but it's not too bad."  Reports occasional itching, which he describes as "feeling like something is crawling under my skin" particularly to his arms.  Symptom resolves when he rubs/lightly scratches the area.  It is not constant, "but it gets bad sometimes."  States that he had 1 mouth ulcer with cycle #1 of chemo, but none since. He has mouthwash if needed. Appetite is good at 75%; he supplements diet with Ensure/Boost as needed.  Denies any dysphagia or sore throat.   Since his last visit, he received 2 doses of IV iron (Feraheme) for iron deficiency anemia. He  tolerated well without complaints. States, "I didn't even know my iron was low, but I might feel a little better since I got the iron."    Overall, he feels well and feels ready for his next cycle of chemo.     REVIEW OF SYSTEMS:  Review of Systems  Constitutional: Positive for fatigue.  HENT:  Negative.  Negative for sore throat and trouble swallowing.   Eyes: Negative.   Respiratory: Negative.  Negative for cough and shortness of breath.   Cardiovascular: Negative.  Negative for chest pain and leg swelling.  Gastrointestinal: Negative.  Negative for abdominal pain, blood in stool, constipation, diarrhea, nausea and vomiting.  Endocrine: Negative.   Genitourinary: Negative.  Negative for dysuria and hematuria.   Skin: Positive for itching.  Hematological: Negative.   Psychiatric/Behavioral: Positive for sleep disturbance.     PAST MEDICAL/SURGICAL HISTORY:  Past Medical History:  Diagnosis Date  . Diabetes mellitus without complication ([place])   . Hypertension   . 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 ESOPHAGOGASTRODUODENOSCOPY (EGD) WITH PROPOFOL N/A 01/16/2017   Procedure: ESOPHAGOGASTRODUODENOSCOPY (EGD) WITH PROPOFOL;  Surgeon: [name], MD;  Location: [facility];  Service: Endoscopy;  Laterality: N/A;  730   . IR FLUORO GUIDE PORT INSERTION RIGHT  02/13/2017  . IR US GUIDE VASC ACCESS RIGHT  02/13/2017  . lipoma removal    . PORTA CATH INSERTION Right 02/13/2017  . SAVORY DILATION N/A 01/16/2017   Procedure: SAVORY DILATION;  Surgeon: [name], MD;  Location: [facility];  Service: Endoscopy;  Laterality: N/A;     SOCIAL HISTORY:  Social History   Social History  . Marital status: Married    Spouse name: N/A  . Number of children: N/A  . Years of education: N/A   Occupational History  . Not on file.   Social History  Main Topics  . Smoking status: Current Some Day Smoker    Packs/day: 0.25    Types: Cigarettes  . Smokeless tobacco: Never Used  . Alcohol use No     Comment: hx heavy alcohol, hasn't drank in 19 yrs  . Drug use: Yes    Types: Marijuana     Comment: history of marijuana   . Sexual activity: Yes   Other Topics Concern  . Not on file   Social History Narrative  . No narrative on file    FAMILY HISTORY:  Family History  Problem Relation Age of Onset  . Prostate cancer Father   . Colon cancer Neg Hx   . Colon polyps Neg Hx     CURRENT MEDICATIONS:  Outpatient Encounter Prescriptions as of 03/14/2017  Medication  Sig Note  . dexamethasone (DECADRON) 4 MG tablet Take 2 tablets (8 mg total) by mouth daily. Start the day after chemotherapy for 2 days. Take with food.   Marland Kitchen dextrose 5 % SOLN 1,000 mL with fluorouracil 5 GM/100ML SOLN Inject into the vein. Every 2 weeks, for 46 hours 02/13/2017: Patient not started  . Diphenhyd-Hydrocort-Nystatin (FIRST-DUKES MOUTHWASH) SUSP Use as directed 5 mLs in the mouth or throat 4 (four) times daily as needed.   Marland Kitchen LEUCOVORIN CALCIUM IV Inject into the vein. Every 2 weeks 02/13/2017: Patient not started  . loratadine (CLARITIN) 10 MG tablet Take 10 mg by mouth daily.   Marland Kitchen losartan (COZAAR) 25 MG tablet Take 25 mg by mouth daily.   . Morphine Sulfate (MORPHINE CONCENTRATE) 10 mg / 0.5 ml concentrated solution Take 0.5 mLs (10 mg total) by mouth every 4 (four) hours as needed for severe pain. 01/30/2017: hasnt started  . ondansetron (ZOFRAN) 8 MG tablet Take 1 tablet (8 mg total) by mouth 2 (two) times daily as needed for refractory nausea / vomiting. Start on day 3 after chemotherapy. 02/13/2017: Patient not started  . OXALIPLATIN IV Inject into the vein. Every 2 weeks 02/13/2017: Patient not started  . pantoprazole (PROTONIX) 40 MG tablet Take 1 tablet (40 mg total) by mouth daily. Take 30 minutes before breakfast   . prochlorperazine (COMPAZINE) 10 MG tablet  Take 1 tablet (10 mg total) by mouth every 6 (six) hours as needed (Nausea or vomiting). 02/13/2017: Patient not started  . [DISCONTINUED] dexamethasone (DECADRON) 4 MG tablet Take 2 tablets (8 mg total) by mouth daily. Start the day after chemotherapy for 2 days. Take with food. 02/13/2017: Patient not started  . lidocaine-prilocaine (EMLA) cream Apply to affected area once 02/13/2017: Patient not started  . metFORMIN (GLUCOPHAGE) 500 MG tablet Take 500 mg by mouth daily.   . polyethylene glycol powder (GLYCOLAX/MIRALAX) powder Take 1 capful daily.   . temazepam (RESTORIL) 30 MG capsule Take 1 capsule (30 mg total) by mouth at bedtime as needed for sleep.   . [DISCONTINUED] Omega-3 Fatty Acids (FISH OIL) 500 MG CAPS Take 500 mg by mouth daily.     No facility-administered encounter medications on file as of 03/14/2017.     ALLERGIES:  No Known Allergies   PHYSICAL EXAM:  ECOG Performance status: 1 - Symptomatic; remains independent      Physical Exam  Constitutional: He is oriented to person, place, and time and well-developed, well-nourished, and in no distress.  Seen/examined in chemo chair in infusion area   HENT:  Head: Normocephalic.  Mouth/Throat: Oropharynx is clear and moist.  Eyes: Conjunctivae are normal. No scleral icterus.  Neck: Normal range of motion. Neck supple.  Cardiovascular: Normal rate, regular rhythm and normal heart sounds.   Pulmonary/Chest: Effort normal and breath sounds normal. No respiratory distress.  Abdominal: Soft. Bowel sounds are normal. There is no tenderness.  Musculoskeletal: Normal range of motion. He exhibits no edema.  Lymphadenopathy:    He has no cervical adenopathy.       Right: No supraclavicular adenopathy present.       Left: No supraclavicular adenopathy present.  Neurological: He is alert and oriented to person, place, and time. No cranial nerve deficit. Gait normal.  Skin: Skin is warm and dry. No rash noted.  Psychiatric: Mood, memory,  affect and judgment normal.  Nursing note and vitals reviewed.    LABORATORY DATA:  I have reviewed the labs as listed.  CBC  Component Value Date/Time   WBC 5.8 03/14/2017 0922   RBC 4.79 03/14/2017 0922   HGB 10.0 (L) 03/14/2017 0922   HCT 30.9 (L) 03/14/2017 0922   PLT 185 03/14/2017 0922   MCV 64.5 (L) 03/14/2017 0922   MCH 20.9 (L) 03/14/2017 0922   MCHC 32.4 03/14/2017 0922   RDW 21.6 (H) 03/14/2017 0922   LYMPHSABS 1.5 03/14/2017 0922   MONOABS 0.3 03/14/2017 0922   EOSABS 0.3 03/14/2017 0922   BASOSABS 0.0 03/14/2017 0922   CMP Latest Ref Rng & Units 03/14/2017 02/28/2017 02/22/2017  Glucose 65 - 99 mg/dL 122(H) 184(H) 159(H)  BUN 6 - 20 mg/dL '8 18 17  ' Creatinine 0.61 - 1.24 mg/dL 0.84 0.99 0.95  Sodium 135 - 145 mmol/L 138 134(L) 135  Potassium 3.5 - 5.1 mmol/L 3.6 3.8 3.6  Chloride 101 - 111 mmol/L 104 98(L) 100(L)  CO2 22 - 32 mmol/L '26 26 27  ' Calcium 8.9 - 10.3 mg/dL 8.8(L) 9.0 9.1  Total Protein 6.5 - 8.1 g/dL 6.5 7.0 7.5  Total Bilirubin 0.3 - 1.2 mg/dL 0.8 0.9 0.6  Alkaline Phos 38 - 126 U/L 76 68 57  AST 15 - 41 U/L '21 26 19  ' ALT 17 - 63 U/L 38 50 14(L)    PENDING LABS:    DIAGNOSTIC IMAGING:  *The following radiologic images and reports have been reviewed independently and agree with below findings.  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 Oneida Alar.  PET scan 01/19/17 revealed primary malignancy involving distal esophagus and gastric cardia, with nodes at base of right neck most consistent with metastatic disease; also metastatic nodes in upper abdomen. Underwent (R) supraclavicular lymph node biopsy on 01/16/17 revealing metastatic poorly differentiated carcinoma; HER2-.  He did not undergo EUS prior to treatment.   Started systemic treatment with FOLFOX chemotherapy on 02/15/17.  -Due for cycle #3 FOLFOX today. Labs reviewed and are adequate for treatment today.  -Continue FOLFOX every 2 weeks as scheduled.  -Return to cancer center in 1 month for follow-up and subsequent chemo.   Sleep disturbance:  -Could be d/t taking steroids incorrectly for the past 2 weeks. Now his sleep-wake cycles is reversed. Discussed the use of medications like Temazepam to be used PRN for sleep.  Recommended he limit his naps to 1 hour during the day to try to improve his ability to be able to sleep at night.   -He agreed to give Temazepam a try. Paper prescription for Temazepam 30 mg po QHSprn, #30, given to patient today.  Recommended he take first dose when he does not have to go to work the next day so he can monitor any adverse side effects like prolonged drowsiness/hungover-feeling the next morning which can occur.    Peripheral neuropathy:  -Symptoms of "crawling under my skin" could be early signs of peripheral neuropathy vs dry skin secondary to chemo. Encouraged him to monitor his symptoms and make [place] aware if they worsen.  Recommended he keep skin well-moisturized as chemo can cause dry skin.  -Intermittent pain to (R) great toe is not likely to be gout flare at this time since his pain is intermittent and he is able to tolerate socks/shoes.  Could  be early peripheral neuropathy secondary to chemo as well. Will continue to assess at each follow-up and encouraged him to let us know if symptoms worsen.   Iron deficiency anemia:  -Likely from esophageal malignancy/ulceration.  -s/p 2 doses of Feraheme on 03/02/17 & 03/09/17 for low ferritin of 9 and low iron saturations of 9%.  -Will continue to intermittently check iron studies and give additional doses of IV iron if needed.      Dispo:  -Continue FOLFOX every 2 weeks as scheduled.  -Return to cancer center in 1 month for follow-up and subsequent chemo.     All  questions were answered to patient's stated satisfaction. Encouraged patient to call with any new concerns or questions before his next visit to the cancer center and we can certain see him sooner, if needed.    Plan of care discussed with Dr. Talbert Cage, who agrees with the above aforementioned.    Orders placed this encounter:  No orders of the defined types were placed in this encounter.     Maxwell Craze, NP [place] [phone]

## 2017-03-14 NOTE — Progress Notes (Signed)
Patient present today for chemotherapy per MD orders. Patient tolerated infusion today without incidence. Patient discharged with port accessed and home infusion pump running. Patient discharged ambulatory and in stable condition from clinic. Patient to follow up as scheduled on Friday this week to have pump removed. He is aware of time.

## 2017-03-14 NOTE — Patient Instructions (Addendum)
[place] at [facility] Discharge Instructions  RECOMMENDATIONS MADE BY THE CONSULTANT AND ANY TEST RESULTS WILL BE SENT TO YOUR REFERRING PHYSICIAN.  You were seen today by [name] NP. Refill sent to your pharmacy for Decadron.    Thank you for choosing [place] at [facility] to provide your oncology and hematology care.  To afford each patient quality time with our provider, please arrive at least 15 minutes before your scheduled appointment time.    If you have a lab appointment with the [facility] please come in thru the  Main Entrance and check in at the main information desk  You need to re-schedule your appointment should you arrive 10 or more minutes late.  We strive to give you quality time with our providers, and arriving late affects you and other patients whose appointments are after yours.  Also, if you no show three or more times for appointments you may be dismissed from the clinic at the providers discretion.     Again, thank you for choosing [facility].  Our hope is that these requests will decrease the amount of time that you wait before being seen by our physicians.       _____________________________________________________________  Should you have questions after your visit to [facility], please contact our office at [phone] between the hours of 8:30 a.m. and 4:30 p.m.  Voicemails left after 4:30 p.m. will not be returned until the following business day.  For prescription refill requests, have your pharmacy contact our office.       Resources For Cancer Patients and their Caregivers ? American Cancer Society: Can assist with transportation, wigs, general needs, runs Look Good Feel Better.        386-209-3162 ? Cancer Care: Provides financial assistance, online support groups, medication/co-pay assistance.  1-800-813-HOPE 984-617-9880) ? [facility] Assists [place] cancer patients and their families through emotional , educational and financial support.  (248) 168-9002 ? [place] DSS Where to apply for food stamps, Medicaid and utility assistance. 864 377 3247 ? [facility]: Transportation to medical appointments. (517)141-1574 ? Social Security Administration: May apply for disability if have a Stage IV cancer. 309 799 1987 670-237-7186 ? [employer], Disability and Transit Services: Assists with nutrition, care and transit needs. [place] Support Programs: @10RELATIVEDAYS @ > Cancer Support Group  2nd Tuesday of the month 1pm-2pm, Journey Room  > Creative Journey  3rd Tuesday of the month 1130am-1pm, Journey Room  > Look Good Feel Better  1st Wednesday of the month 10am-12 noon, Journey Room (Call [place] to register [phone])

## 2017-03-16 ENCOUNTER — Encounter (HOSPITAL_COMMUNITY): Payer: Self-pay

## 2017-03-16 ENCOUNTER — Encounter (HOSPITAL_BASED_OUTPATIENT_CLINIC_OR_DEPARTMENT_OTHER): Payer: Self-pay

## 2017-03-16 VITALS — BP 130/99 | HR 50 | Temp 98.0°F | Resp 18

## 2017-03-16 DIAGNOSIS — C779 Secondary and unspecified malignant neoplasm of lymph node, unspecified: Secondary | ICD-10-CM

## 2017-03-16 DIAGNOSIS — C160 Malignant neoplasm of cardia: Secondary | ICD-10-CM

## 2017-03-16 DIAGNOSIS — C155 Malignant neoplasm of lower third of esophagus: Secondary | ICD-10-CM

## 2017-03-16 DIAGNOSIS — C159 Malignant neoplasm of esophagus, unspecified: Secondary | ICD-10-CM

## 2017-03-16 MED ORDER — HEPARIN SOD (PORK) LOCK FLUSH 100 UNIT/ML IV SOLN
500.0000 [IU] | Freq: Once | INTRAVENOUS | Status: AC | PRN
  Administered 2017-03-16: 500 [IU]

## 2017-03-16 MED ORDER — SODIUM CHLORIDE 0.9% FLUSH
10.0000 mL | INTRAVENOUS | Status: DC | PRN
  Administered 2017-03-16: 10 mL
  Filled 2017-03-16: qty 10

## 2017-03-16 NOTE — Progress Notes (Signed)
Maxwell Henderson presents to have home infusion pump d/c'd and for port-a-cath deaccess with flush.  Portacath located right chest wall accessed with  H 20 needle.  Good blood return present. Portacath flushed with NS and 500U/38ml Heparin, and needle removed intact.  Procedure tolerated well and without incident.  Discharged ambulatory.

## 2017-03-28 ENCOUNTER — Encounter (HOSPITAL_BASED_OUTPATIENT_CLINIC_OR_DEPARTMENT_OTHER): Payer: Self-pay

## 2017-03-28 ENCOUNTER — Encounter (HOSPITAL_COMMUNITY): Payer: Self-pay

## 2017-03-28 VITALS — BP 148/90 | HR 68 | Temp 98.3°F | Resp 18 | Wt 205.0 lb

## 2017-03-28 DIAGNOSIS — C155 Malignant neoplasm of lower third of esophagus: Secondary | ICD-10-CM

## 2017-03-28 DIAGNOSIS — C779 Secondary and unspecified malignant neoplasm of lymph node, unspecified: Secondary | ICD-10-CM

## 2017-03-28 DIAGNOSIS — C159 Malignant neoplasm of esophagus, unspecified: Secondary | ICD-10-CM

## 2017-03-28 DIAGNOSIS — Z5111 Encounter for antineoplastic chemotherapy: Secondary | ICD-10-CM

## 2017-03-28 DIAGNOSIS — C160 Malignant neoplasm of cardia: Secondary | ICD-10-CM

## 2017-03-28 LAB — COMPREHENSIVE METABOLIC PANEL
ALBUMIN: 3.7 g/dL (ref 3.5–5.0)
ALK PHOS: 72 U/L (ref 38–126)
ALT: 41 U/L (ref 17–63)
AST: 31 U/L (ref 15–41)
Anion gap: 8 (ref 5–15)
BILIRUBIN TOTAL: 0.8 mg/dL (ref 0.3–1.2)
BUN: 10 mg/dL (ref 6–20)
CALCIUM: 8.7 mg/dL — AB (ref 8.9–10.3)
CO2: 26 mmol/L (ref 22–32)
Chloride: 102 mmol/L (ref 101–111)
Creatinine, Ser: 0.94 mg/dL (ref 0.61–1.24)
GFR calc Af Amer: >60 mL/min (ref >60)
GLUCOSE: 162 mg/dL — AB (ref 65–99)
Potassium: 3.7 mmol/L (ref 3.5–5.1)
Sodium: 136 mmol/L (ref 135–145)
TOTAL PROTEIN: 6.3 g/dL — AB (ref 6.5–8.1)

## 2017-03-28 LAB — CBC WITH DIFFERENTIAL/PLATELET
BASOS ABS: 0 10*3/uL (ref 0.0–0.1)
Basophils Relative: 0 %
EOS PCT: 1 %
Eosinophils Absolute: 0.1 10*3/uL (ref 0.0–0.7)
HEMATOCRIT: 34 % — AB (ref 39.0–52.0)
HEMOGLOBIN: 11.2 g/dL — AB (ref 13.0–17.0)
LYMPHS ABS: 1.4 10*3/uL (ref 0.7–4.0)
LYMPHS PCT: 25 %
MCH: 22.1 pg — ABNORMAL LOW (ref 26.0–34.0)
MCHC: 32.9 g/dL (ref 30.0–36.0)
MCV: 67.2 fL — AB (ref 78.0–100.0)
MONOS PCT: 12 %
Monocytes Absolute: 0.7 10*3/uL (ref 0.1–1.0)
Neutro Abs: 3.5 10*3/uL (ref 1.7–7.7)
Neutrophils Relative %: 62 %
Platelets: 194 10*3/uL (ref 150–400)
RBC: 5.06 MIL/uL (ref 4.22–5.81)
RDW: 26.5 % — AB (ref 11.5–15.5)
WBC: 5.7 10*3/uL (ref 4.0–10.5)

## 2017-03-28 MED ORDER — FLUOROURACIL CHEMO INJECTION 2.5 GM/50ML
400.0000 mg/m2 | Freq: Once | INTRAVENOUS | Status: AC
  Administered 2017-03-28: 900 mg via INTRAVENOUS
  Filled 2017-03-28: qty 18

## 2017-03-28 MED ORDER — DEXTROSE 5 % IV SOLN
Freq: Once | INTRAVENOUS | Status: AC
  Administered 2017-03-28: 10:00:00 via INTRAVENOUS

## 2017-03-28 MED ORDER — DEXAMETHASONE SODIUM PHOSPHATE 10 MG/ML IJ SOLN
10.0000 mg | Freq: Once | INTRAMUSCULAR | Status: AC
  Administered 2017-03-28: 10 mg via INTRAVENOUS

## 2017-03-28 MED ORDER — OXALIPLATIN CHEMO INJECTION 100 MG/20ML
85.0000 mg/m2 | Freq: Once | INTRAVENOUS | Status: AC
  Administered 2017-03-28: 190 mg via INTRAVENOUS
  Filled 2017-03-28: qty 38

## 2017-03-28 MED ORDER — PALONOSETRON HCL INJECTION 0.25 MG/5ML
0.2500 mg | Freq: Once | INTRAVENOUS | Status: AC
  Administered 2017-03-28: 0.25 mg via INTRAVENOUS

## 2017-03-28 MED ORDER — SODIUM CHLORIDE 0.9% FLUSH
10.0000 mL | INTRAVENOUS | Status: DC | PRN
  Administered 2017-03-28: 10 mL
  Filled 2017-03-28: qty 10

## 2017-03-28 MED ORDER — SODIUM CHLORIDE 0.9 % IV SOLN
2400.0000 mg/m2 | INTRAVENOUS | Status: DC
  Administered 2017-03-28: 5300 mg via INTRAVENOUS
  Filled 2017-03-28: qty 106

## 2017-03-28 MED ORDER — DEXAMETHASONE SODIUM PHOSPHATE 10 MG/ML IJ SOLN
INTRAMUSCULAR | Status: AC
  Filled 2017-03-28: qty 1

## 2017-03-28 MED ORDER — LEUCOVORIN CALCIUM INJECTION 350 MG
407.0000 mg/m2 | Freq: Once | INTRAVENOUS | Status: AC
  Administered 2017-03-28: 900 mg via INTRAVENOUS
  Filled 2017-03-28: qty 35

## 2017-03-28 MED ORDER — PALONOSETRON HCL INJECTION 0.25 MG/5ML
INTRAVENOUS | Status: AC
  Filled 2017-03-28: qty 5

## 2017-03-28 NOTE — Progress Notes (Signed)
Maxwell Henderson tolerated chemo tx well without complaints or incident. Labs reviewed prior to administering chemotherapy. Pt discharged with 5FU pump infusing without issues. VSS upon discharge. Pt discharged self ambulatory in satisfactory condition accompanied by his wife

## 2017-03-28 NOTE — Patient Instructions (Signed)
[facility] Discharge Instructions for Patients Receiving Chemotherapy   Beginning January 23rd 2017 lab work for the Cancer Center will be done in the  Main lab at [facility] on 1st floor. If you have a lab appointment with the Cancer Center please come in thru the  Main Entrance and check in at the main information desk   Today you received the following chemotherapy agents Oxaliplatin,Leucovorin and 5FU. Follow-up as scheduled. Call clinic for any questions or concerns  To help prevent nausea and vomiting after your treatment, we encourage you to take your nausea medication   If you develop nausea and vomiting, or diarrhea that is not controlled by your medication, call the clinic.  The clinic phone number is [phone]. Office hours are Monday-Friday 8:30am-5:00pm.  BELOW ARE SYMPTOMS THAT SHOULD BE REPORTED IMMEDIATELY:  *FEVER GREATER THAN 101.0 F  *CHILLS WITH OR WITHOUT FEVER  NAUSEA AND VOMITING THAT IS NOT CONTROLLED WITH YOUR NAUSEA MEDICATION  *UNUSUAL SHORTNESS OF BREATH  *UNUSUAL BRUISING OR BLEEDING  TENDERNESS IN MOUTH AND THROAT WITH OR WITHOUT PRESENCE OF ULCERS  *URINARY PROBLEMS  *BOWEL PROBLEMS  UNUSUAL RASH Items with * indicate a potential emergency and should be followed up as soon as possible. If you have an emergency after office hours please contact your primary care physician or go to the nearest emergency department.  Please call the clinic during office hours if you have any questions or concerns.   You may also contact the Patient Navigator at [phone] should you have any questions or need assistance in obtaining follow up care.      Resources For Cancer Patients and their Caregivers ? American Cancer Society: Can assist with transportation, wigs, general needs, runs Look Good Feel Better.        1-888-227-6333 ? Cancer Care: Provides financial assistance, online support groups, medication/co-pay assistance.   1-800-813-HOPE (4673) ? [facility] Assists [place] cancer patients and their families through emotional , educational and financial support.  336-427-4357 ? [place] DSS Where to apply for food stamps, Medicaid and utility assistance. 336-342-1394 ? [facility]: Transportation to medical appointments. 336-347-2287 ? Social Security Administration: May apply for disability if have a Stage IV cancer. 336-342-7796 1-800-772-1213 ? [place] Aging, Disability and Transit Services: Assists with nutrition, care and transit needs. 336-349-2343         

## 2017-03-30 ENCOUNTER — Encounter (HOSPITAL_COMMUNITY): Payer: Self-pay

## 2017-03-30 ENCOUNTER — Encounter (HOSPITAL_BASED_OUTPATIENT_CLINIC_OR_DEPARTMENT_OTHER): Payer: Self-pay

## 2017-03-30 VITALS — BP 119/74 | HR 83 | Temp 98.0°F | Resp 18

## 2017-03-30 DIAGNOSIS — C160 Malignant neoplasm of cardia: Secondary | ICD-10-CM

## 2017-03-30 DIAGNOSIS — C159 Malignant neoplasm of esophagus, unspecified: Secondary | ICD-10-CM

## 2017-03-30 DIAGNOSIS — C155 Malignant neoplasm of lower third of esophagus: Secondary | ICD-10-CM

## 2017-03-30 DIAGNOSIS — C779 Secondary and unspecified malignant neoplasm of lymph node, unspecified: Secondary | ICD-10-CM

## 2017-03-30 MED ORDER — HEPARIN SOD (PORK) LOCK FLUSH 100 UNIT/ML IV SOLN
500.0000 [IU] | Freq: Once | INTRAVENOUS | Status: AC | PRN
  Administered 2017-03-30: 500 [IU]

## 2017-03-30 MED ORDER — SODIUM CHLORIDE 0.9% FLUSH
10.0000 mL | INTRAVENOUS | Status: DC | PRN
  Administered 2017-03-30: 10 mL
  Filled 2017-03-30: qty 10

## 2017-03-30 NOTE — Progress Notes (Signed)
Maxwell Henderson tolerated 5FU pump discontinuation well without complaints or incident. After pump discontinued port flushed with 10 ml NS and 5 ml Heparin easily per protocol then de-accessed.VSS Pt discharged self ambulatory in satisfactory condition accompanied by his wife

## 2017-03-30 NOTE — Patient Instructions (Signed)
[facility] at [facility] Discharge Instructions  RECOMMENDATIONS MADE BY THE CONSULTANT AND ANY TEST RESULTS WILL BE SENT TO YOUR REFERRING PHYSICIAN.  5FU pump discontinued today with port flushed per protocol. Follow-up as scheduled. Call clinic for any questions or concerns  Thank you for choosing [facility] at [facility] to provide your oncology and hematology care.  To afford each patient quality time with our provider, please arrive at least 15 minutes before your scheduled appointment time.    If you have a lab appointment with the Cancer Center please come in thru the  Main Entrance and check in at the main information desk  You need to re-schedule your appointment should you arrive 10 or more minutes late.  We strive to give you quality time with our providers, and arriving late affects you and other patients whose appointments are after yours.  Also, if you no show three or more times for appointments you may be dismissed from the clinic at the providers discretion.     Again, thank you for choosing [facility].  Our hope is that these requests will decrease the amount of time that you wait before being seen by our physicians.       _____________________________________________________________  Should you have questions after your visit to [facility], please contact our office at [phone] between the hours of 8:30 a.m. and 4:30 p.m.  Voicemails left after 4:30 p.m. will not be returned until the following business day.  For prescription refill requests, have your pharmacy contact our office.       Resources For Cancer Patients and their Caregivers ? American Cancer Society: Can assist with transportation, wigs, general needs, runs Look Good Feel Better.        1-888-227-6333 ? Cancer Care: Provides financial assistance, online support groups, medication/co-pay assistance.  1-800-813-HOPE  (4673) ? [facility] Assists [place] cancer patients and their families through emotional , educational and financial support.  336-427-4357 ? [place] DSS Where to apply for food stamps, Medicaid and utility assistance. 336-342-1394 ? RCATS: Transportation to medical appointments. 336-347-2287 ? Social Security Administration: May apply for disability if have a Stage IV cancer. 336-342-7796 1-800-772-1213 ? [place] Aging, Disability and Transit Services: Assists with nutrition, care and transit needs. 336-349-2343  Cancer Center Support Programs: @10RELATIVEDAYS@ > Cancer Support Group  2nd Tuesday of the month 1pm-2pm, Journey Room  > Creative Journey  3rd Tuesday of the month 1130am-1pm, Journey Room  > Look Good Feel Better  1st Wednesday of the month 10am-12 noon, Journey Room (Call American Cancer [employer] to register 1-800-395-5775)   

## 2017-04-02 ENCOUNTER — Other Ambulatory Visit (HOSPITAL_COMMUNITY): Payer: Self-pay | Admitting: Oncology

## 2017-04-02 DIAGNOSIS — C159 Malignant neoplasm of esophagus, unspecified: Secondary | ICD-10-CM

## 2017-04-10 ENCOUNTER — Other Ambulatory Visit (HOSPITAL_COMMUNITY): Payer: Self-pay

## 2017-04-10 ENCOUNTER — Other Ambulatory Visit (HOSPITAL_COMMUNITY): Payer: Self-pay | Admitting: Oncology

## 2017-04-10 DIAGNOSIS — C159 Malignant neoplasm of esophagus, unspecified: Secondary | ICD-10-CM

## 2017-04-10 MED ORDER — PROCHLORPERAZINE MALEATE 10 MG PO TABS: 10.0000 mg | ORAL_TABLET | Freq: Four times a day (QID) | ORAL | 1 refills | Status: DC | PRN

## 2017-04-10 NOTE — Telephone Encounter (Signed)
Received refill request from patients pharmacy for compazine. Reviewed with provider, chart checked and refilled.

## 2017-04-11 ENCOUNTER — Encounter (HOSPITAL_BASED_OUTPATIENT_CLINIC_OR_DEPARTMENT_OTHER): Payer: Self-pay

## 2017-04-11 ENCOUNTER — Encounter (HOSPITAL_BASED_OUTPATIENT_CLINIC_OR_DEPARTMENT_OTHER): Payer: Self-pay | Admitting: Adult Health

## 2017-04-11 ENCOUNTER — Encounter (HOSPITAL_COMMUNITY): Payer: Self-pay

## 2017-04-11 VITALS — BP 154/94 | HR 62 | Temp 97.6°F | Resp 16 | Wt 208.4 lb

## 2017-04-11 DIAGNOSIS — C155 Malignant neoplasm of lower third of esophagus: Secondary | ICD-10-CM

## 2017-04-11 DIAGNOSIS — C159 Malignant neoplasm of esophagus, unspecified: Secondary | ICD-10-CM

## 2017-04-11 DIAGNOSIS — D509 Iron deficiency anemia, unspecified: Secondary | ICD-10-CM

## 2017-04-11 DIAGNOSIS — E876 Hypokalemia: Secondary | ICD-10-CM

## 2017-04-11 DIAGNOSIS — D500 Iron deficiency anemia secondary to blood loss (chronic): Secondary | ICD-10-CM

## 2017-04-11 DIAGNOSIS — Z5111 Encounter for antineoplastic chemotherapy: Secondary | ICD-10-CM

## 2017-04-11 DIAGNOSIS — C160 Malignant neoplasm of cardia: Secondary | ICD-10-CM

## 2017-04-11 DIAGNOSIS — C779 Secondary and unspecified malignant neoplasm of lymph node, unspecified: Secondary | ICD-10-CM

## 2017-04-11 DIAGNOSIS — C770 Secondary and unspecified malignant neoplasm of lymph nodes of head, face and neck: Secondary | ICD-10-CM

## 2017-04-11 LAB — CBC WITH DIFFERENTIAL/PLATELET
BASOS ABS: 0 10*3/uL (ref 0.0–0.1)
Basophils Relative: 0 %
EOS PCT: 1 %
Eosinophils Absolute: 0.1 10*3/uL (ref 0.0–0.7)
HEMATOCRIT: 31.9 % — AB (ref 39.0–52.0)
HEMOGLOBIN: 10.6 g/dL — AB (ref 13.0–17.0)
LYMPHS ABS: 1.2 10*3/uL (ref 0.7–4.0)
LYMPHS PCT: 25 %
MCH: 22.9 pg — ABNORMAL LOW (ref 26.0–34.0)
MCHC: 33.2 g/dL (ref 30.0–36.0)
MCV: 68.9 fL — AB (ref 78.0–100.0)
MONO ABS: 0.5 10*3/uL (ref 0.1–1.0)
Monocytes Relative: 11 %
NEUTROS ABS: 3.1 10*3/uL (ref 1.7–7.7)
Neutrophils Relative %: 63 %
Platelets: 192 10*3/uL (ref 150–400)
RBC: 4.63 MIL/uL (ref 4.22–5.81)
RDW: 28.2 % — AB (ref 11.5–15.5)
WBC: 5 10*3/uL (ref 4.0–10.5)

## 2017-04-11 LAB — COMPREHENSIVE METABOLIC PANEL
ALBUMIN: 3.9 g/dL (ref 3.5–5.0)
ALK PHOS: 74 U/L (ref 38–126)
ALT: 32 U/L (ref 17–63)
AST: 23 U/L (ref 15–41)
Anion gap: 10 (ref 5–15)
BILIRUBIN TOTAL: 0.5 mg/dL (ref 0.3–1.2)
BUN: 9 mg/dL (ref 6–20)
CALCIUM: 8.7 mg/dL — AB (ref 8.9–10.3)
CO2: 26 mmol/L (ref 22–32)
CREATININE: 0.86 mg/dL (ref 0.61–1.24)
Chloride: 101 mmol/L (ref 101–111)
GFR calc Af Amer: >60 mL/min (ref >60)
GLUCOSE: 177 mg/dL — AB (ref 65–99)
Potassium: 2.8 mmol/L — ABNORMAL LOW (ref 3.5–5.1)
Sodium: 137 mmol/L (ref 135–145)
TOTAL PROTEIN: 6.6 g/dL (ref 6.5–8.1)

## 2017-04-11 LAB — FERRITIN: FERRITIN: 90 ng/mL (ref 24–336)

## 2017-04-11 LAB — IRON AND TIBC
IRON: 60 ug/dL (ref 45–182)
Saturation Ratios: 17 % — ABNORMAL LOW (ref 17.9–39.5)
TIBC: 349 ug/dL (ref 250–450)
UIBC: 289 ug/dL

## 2017-04-11 MED ORDER — SODIUM CHLORIDE 0.9% FLUSH
10.0000 mL | INTRAVENOUS | Status: DC | PRN
  Administered 2017-04-11: 10 mL
  Filled 2017-04-11: qty 10

## 2017-04-11 MED ORDER — FLUOROURACIL CHEMO INJECTION 2.5 GM/50ML
400.0000 mg/m2 | Freq: Once | INTRAVENOUS | Status: AC
  Administered 2017-04-11: 900 mg via INTRAVENOUS
  Filled 2017-04-11: qty 18

## 2017-04-11 MED ORDER — POTASSIUM CHLORIDE 10 MEQ/100ML IV SOLN
10.0000 meq | INTRAVENOUS | Status: AC
  Administered 2017-04-11 (×4): 10 meq via INTRAVENOUS
  Filled 2017-04-11 (×4): qty 100

## 2017-04-11 MED ORDER — PALONOSETRON HCL INJECTION 0.25 MG/5ML
0.2500 mg | Freq: Once | INTRAVENOUS | Status: AC
  Administered 2017-04-11: 0.25 mg via INTRAVENOUS
  Filled 2017-04-11: qty 5

## 2017-04-11 MED ORDER — OXALIPLATIN CHEMO INJECTION 100 MG/20ML
85.0000 mg/m2 | Freq: Once | INTRAVENOUS | Status: AC
  Administered 2017-04-11: 190 mg via INTRAVENOUS
  Filled 2017-04-11: qty 38

## 2017-04-11 MED ORDER — DEXTROSE 5 % IV SOLN
Freq: Once | INTRAVENOUS | Status: AC
  Administered 2017-04-11: 15:00:00 via INTRAVENOUS

## 2017-04-11 MED ORDER — POTASSIUM CHLORIDE 20 MEQ PO PACK
60.0000 meq | PACK | Freq: Once | ORAL | Status: AC
  Administered 2017-04-11: 60 meq via ORAL
  Filled 2017-04-11: qty 3

## 2017-04-11 MED ORDER — SODIUM CHLORIDE 0.9 % IV SOLN
2400.0000 mg/m2 | INTRAVENOUS | Status: DC
  Administered 2017-04-11: 5300 mg via INTRAVENOUS
  Filled 2017-04-11: qty 100

## 2017-04-11 MED ORDER — LEUCOVORIN CALCIUM INJECTION 350 MG
407.0000 mg/m2 | Freq: Once | INTRAVENOUS | Status: AC
  Administered 2017-04-11: 900 mg via INTRAVENOUS
  Filled 2017-04-11: qty 35

## 2017-04-11 MED ORDER — DEXAMETHASONE SODIUM PHOSPHATE 10 MG/ML IJ SOLN
10.0000 mg | Freq: Once | INTRAMUSCULAR | Status: AC
  Administered 2017-04-11: 10 mg via INTRAVENOUS
  Filled 2017-04-11: qty 1

## 2017-04-11 MED ORDER — SODIUM CHLORIDE 0.9 % IV SOLN
INTRAVENOUS | Status: DC
  Administered 2017-04-11: 10:00:00 via INTRAVENOUS

## 2017-04-11 NOTE — Progress Notes (Signed)
Treatment given per orders. Patient tolerated it well without problems. Vitals stable and discharged home from clinic ambulatory. Follow up as scheduled.  

## 2017-04-11 NOTE — Patient Instructions (Signed)
[facility] Discharge Instructions for Patients Receiving Chemotherapy    If you have a lab appointment with the [facility] please come in thru the  Main Entrance and check in at the main information desk   Today you received the following chemotherapy agents: Oxaliplatin, Leucovorin, Flurourocil  To help prevent nausea and vomiting after your treatment, we encourage you to take your nausea medication as prescribed.   If you develop nausea and vomiting, or diarrhea that is not controlled by your medication, call the clinic.  The clinic phone number is [phone]. Office hours are Monday-Friday 8:30am-5:00pm.  BELOW ARE SYMPTOMS THAT SHOULD BE REPORTED IMMEDIATELY:  *FEVER GREATER THAN 101.0 F  *CHILLS WITH OR WITHOUT FEVER  NAUSEA AND VOMITING THAT IS NOT CONTROLLED WITH YOUR NAUSEA MEDICATION  *UNUSUAL SHORTNESS OF BREATH  *UNUSUAL BRUISING OR BLEEDING  TENDERNESS IN MOUTH AND THROAT WITH OR WITHOUT PRESENCE OF ULCERS  *URINARY PROBLEMS  *BOWEL PROBLEMS  UNUSUAL RASH Items with * indicate a potential emergency and should be followed up as soon as possible. If you have an emergency after office hours please contact your primary care physician or go to the nearest emergency department.  Please call the clinic during office hours if you have any questions or concerns.   You may also contact the Patient Navigator at [phone] should you have any questions or need assistance in obtaining follow up care.      Resources For Cancer Patients and their Caregivers ? American Cancer Society: Can assist with transportation, wigs, general needs, runs Look Good Feel Better.        (514) [phone] ? Cancer Care: Provides financial assistance, online support groups, medication/co-pay assistance.  1-800-813-HOPE (906)087-2429) ? [facility] Assists [place] cancer patients and their families through emotional , educational and  financial support.  408-185-1204 ? [place] DSS Where to apply for food stamps, Medicaid and utility assistance. (615)424-3488 ? RCATS: Transportation to medical appointments. 804-071-1278 ? Social Security Administration: May apply for disability if have a Stage IV cancer. 579-025-5387 (309) 832-1366 ? [employer], Disability and Transit Services: Assists with nutrition, care and transit needs. 919-307-0492

## 2017-04-11 NOTE — Progress Notes (Signed)
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 Barney Drain. Partially obstructing, likely malignant esophageal tumor was found in the distal esophagus. Biopsied with surgical path demonstrating Adenocarcinoma. Injected. Treated with argon plasma coagulation (APC) #3. 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 Biopsied with surgical path demonstrating Adenocarcinoma.      01/19/2017 PET scan    NECK Two FDG avid nodes are seen in the base of the neck on the right. The first is seen on CT image 35, just posterior to the right thyroid lobe and the other is seen on CT image 41 measuring 16 mm in short axis with a maximum SUV of 8.6.  CHEST  The patient's known malignancy involving the distal esophagus and cardia of the stomach is FDG avid with a maximum SUV of 10.4. No definitive metastatic nodes within the chest.  ABDOMEN/PELVIS  FDG avid metastatic adenopathy is seen in the gastrohepatic ligament, paraceliac nodes, posterior to the IVC on image 111, and to the left of the SMA on image 112. The most inferior node is seen in the aortocaval region on image 117. The representative node to the left of the SMA was measured on series 4, image 111 measuring 2.5 by 2.1 cm with a maximum SUV of 7. No other FDG avid disease in the abdomen or pelvis. Cholelithiasis is identified.      02/02/2017 Procedure    US guided bx of right supraclavicular LN by IR.      02/05/2017 Pathology Results     Lymph node, needle/core biopsy, Right Supraclavicular - METASTATIC POORLY DIFFERENTIATED CARCINOMA.      02/10/2017 Pathology Results    HER2 - NEGATIVE      02/13/2017 Procedure    Port placed by IR      02/15/2017 -  Chemotherapy    FOLFOX         INTERVAL HISTORY:  Maxwell Henderson 50 y.o. male returns for routine follow-up for esophageal cancer.   Due for cycle #5 FOLFOX today.    Overall, he tells me that he has been feeling "great."  He has been tolerating chemotherapy very well; he occasionally has some fatigue, but it is manageable.  Denies any peripheral neuropathy, nausea, vomiting, or diarrhea.  His appetite and energy levels are good. He continues to work and feel really well.  He jokingly says, "I feel so good that I don't think y'all are really giving me chemo."   His hands are peeling, but denies any pain to his hands. No peeling or redness noted to his feet.    Overall, he feels well and feels ready for his next cycle of chemo.     REVIEW OF SYSTEMS:  Review of Systems  Constitutional: Positive for fatigue. Negative for chills and fever.  HENT:  Negative.  Negative for lump/mass and nosebleeds.   Eyes: Negative.   Respiratory: Negative.  Negative for cough and shortness of breath.   Cardiovascular: Negative.  Negative for chest pain and leg swelling.  Gastrointestinal: Negative.  Negative for abdominal pain, blood in stool, constipation, diarrhea, nausea and vomiting.  Endocrine: Negative.   Genitourinary: Negative.  Negative for dysuria and hematuria.   Musculoskeletal: Negative.  Negative for arthralgias.  Skin: Negative.  Negative for rash.  Neurological: Negative.  Negative for dizziness and headaches.  Hematological: Negative.  Negative for adenopathy. Does not bruise/bleed easily.  Psychiatric/Behavioral: Negative.  Negative for depression and sleep disturbance. The patient is not nervous/anxious.      PAST MEDICAL/SURGICAL HISTORY:  Past Medical  History:  Diagnosis Date  . Diabetes mellitus without complication ([place])   . Hypertension   . 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 ESOPHAGOGASTRODUODENOSCOPY (EGD) WITH PROPOFOL N/A 01/16/2017   Procedure: ESOPHAGOGASTRODUODENOSCOPY (EGD) WITH PROPOFOL;  Surgeon: [name], MD;  Location: [facility];  Service: Endoscopy;  Laterality: N/A;  730   . IR FLUORO GUIDE PORT INSERTION RIGHT  02/13/2017  . IR US GUIDE VASC ACCESS RIGHT  02/13/2017  . lipoma removal    . PORTA CATH INSERTION Right 02/13/2017  . SAVORY DILATION N/A 01/16/2017   Procedure: SAVORY DILATION;  Surgeon: [name], MD;  Location: [facility] END[facility] SU[facility];  Service: Endoscopy;  Laterality: N/A;     SOCIAL HISTORY:  Social History   Social History  . Marital status: Married    Spouse name: N/A  . Number of children: N/A  . Years of education: N/A   Occupational History  . Not on file.   Social History Main Topics  . Smoking status: Current Some Day Smoker    Packs/day: 0.25    Types: Cigarettes  . Smokeless tobacco: Never Used  . Alcohol use No     Comment: hx heavy alcohol, hasn't drank in 19 yrs  . Drug use: Yes    Types: Marijuana     Comment: history of marijuana   . Sexual activity: Yes   Other Topics Concern  . Not on file   Social History Narrative  . No narrative on file    FAMILY HISTORY:  Family History  Problem Relation Age of Onset  . Prostate cancer Father   . Colon cancer Neg Hx   . Colon polyps Neg Hx     CURRENT MEDICATIONS:  Outpatient Encounter Prescriptions as of 04/11/2017  Medication Sig Note  . dexamethasone (DECADRON) 4 MG tablet Take 2 tablets (8 mg total) by mouth daily. Start the day after chemotherapy for 2 days. Take with food.   Marland Kitchen dextrose 5 % SOLN 1,000 mL with fluorouracil 5 GM/100ML SOLN Inject into the vein. Every 2 weeks, for 46  hours 02/13/2017: Patient not started  . Diphenhyd-Hydrocort-Nystatin (FIRST-DUKES MOUTHWASH) SUSP Use as directed 5 mLs in the mouth or throat 4 (four) times daily as needed.   Marland Kitchen LEUCOVORIN CALCIUM IV Inject into the vein. Every 2 weeks 02/13/2017: Patient not started  . lidocaine-prilocaine (EMLA) cream Apply to affected area once 02/13/2017: Patient not started  . loratadine (CLARITIN) 10 MG tablet Take 10 mg by mouth daily.   Marland Kitchen losartan (COZAAR) 25 MG tablet Take 25 mg by mouth daily.   . metFORMIN (GLUCOPHAGE) 500 MG tablet Take 500 mg by mouth daily.   . Morphine Sulfate (MORPHINE CONCENTRATE) 10 mg / 0.5 ml concentrated solution Take 0.5 mLs (10 mg total) by mouth every 4 (four) hours as needed for severe pain. 01/30/2017: hasnt  started  . ondansetron (ZOFRAN) 8 MG tablet Take 1 tablet (8 mg total) by mouth 2 (two) times daily as needed for refractory nausea / vomiting. Start on day 3 after chemotherapy. 02/13/2017: Patient not started  . OXALIPLATIN IV Inject into the vein. Every 2 weeks 02/13/2017: Patient not started  . pantoprazole (PROTONIX) 40 MG tablet Take 1 tablet (40 mg total) by mouth daily. Take 30 minutes before breakfast   . polyethylene glycol powder (GLYCOLAX/MIRALAX) powder Take 1 capful daily.   . prochlorperazine (COMPAZINE) 10 MG tablet Take 1 tablet (10 mg total) by mouth every 6 (six) hours as needed (Nausea or vomiting).   . temazepam (RESTORIL) 30 MG capsule Take 1 capsule (30 mg total) by mouth at bedtime as needed for sleep.    No facility-administered encounter medications on file as of 04/11/2017.     ALLERGIES:  No Known Allergies   PHYSICAL EXAM:  ECOG Performance status: 1 - Symptomatic; remains independent      Physical Exam  Constitutional: He is oriented to person, place, and time and well-developed, well-nourished, and in no distress.  Seen in chemo chair in infusion area   HENT:  Head: Normocephalic.  Mouth/Throat: Oropharynx is clear and moist.  Eyes:  Conjunctivae are normal. No scleral icterus.  Neck: Normal range of motion. Neck supple.  Cardiovascular: Normal rate and regular rhythm.   Pulmonary/Chest: Effort normal and breath sounds normal. No respiratory distress.  Abdominal: Soft. Bowel sounds are normal. There is no tenderness.  Musculoskeletal: Normal range of motion. He exhibits no edema.  Lymphadenopathy:    He has no cervical adenopathy.       Right: No supraclavicular adenopathy present.       Left: No supraclavicular adenopathy present.  Neurological: He is alert and oriented to person, place, and time. No cranial nerve deficit.  Skin: Skin is warm and dry. No rash noted.  Psychiatric: Mood, memory, affect and judgment normal.  Nursing note and vitals reviewed.    LABORATORY DATA:  I have reviewed the labs as listed.  CBC    Component Value Date/Time   WBC 5.0 04/11/2017 0822   RBC 4.63 04/11/2017 0822   HGB 10.6 (L) 04/11/2017 0822   HCT 31.9 (L) 04/11/2017 0822   PLT 192 04/11/2017 0822   MCV 68.9 (L) 04/11/2017 0822   MCH 22.9 (L) 04/11/2017 0822   MCHC 33.2 04/11/2017 0822   RDW 28.2 (H) 04/11/2017 0822   LYMPHSABS 1.2 04/11/2017 0822   MONOABS 0.5 04/11/2017 0822   EOSABS 0.1 04/11/2017 0822   BASOSABS 0.0 04/11/2017 0822   CMP Latest Ref Rng & Units 04/11/2017 03/28/2017 03/14/2017  Glucose 65 - 99 mg/dL 177(H) 162(H) 122(H)  BUN 6 - 20 mg/dL '9 10 8  ' Creatinine 0.61 - 1.24 mg/dL 0.86 0.94 0.84  Sodium 135 - 145 mmol/L 137 136 138  Potassium 3.5 - 5.1 mmol/L 2.8(L) 3.7 3.6  Chloride 101 - 111 mmol/L 101 102 104  CO2 22 - 32 mmol/L '26 26 26  ' Calcium 8.9 - 10.3 mg/dL 8.7(L) 8.7(L) 8.8(L)  Total Protein 6.5 - 8.1 g/dL 6.6 6.3(L) 6.5  Total Bilirubin 0.3 - 1.2 mg/dL 0.5 0.8 0.8  Alkaline Phos 38 - 126 U/L 74 72 76  AST 15 - 41 U/L '23 31 21  ' ALT 17 - 63 U/L 32 41 38    PENDING LABS:    DIAGNOSTIC IMAGING:  *The following radiologic images and reports have been reviewed independently and agree with  below findings.  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 Oneida Alar.  PET scan 01/19/17 revealed primary malignancy involving distal esophagus and gastric cardia, with nodes at base of right neck most consistent with metastatic disease; also metastatic nodes in upper abdomen. Underwent (R) supraclavicular lymph node biopsy on 01/16/17 revealing metastatic poorly differentiated carcinoma; HER2-.  He did not undergo EUS prior to treatment.  Started systemic treatment with FOLFOX chemotherapy on 02/15/17.  -Due for cycle #5 FOLFOX today. Labs reviewed and are adequate for treatment today.  -Continue FOLFOX every 2 weeks as scheduled.  -Restaging imaging due after cycle #6; orders for CT chest/abd/pelvis placed today.  -Return to cancer center a few days after CT scan for follow-up visit to review results and subsequent chemotherapy treatment.   Hypokalemia:  -Likely d/t chemo. Serum K 2.8 today.  -Will replete with 40 mEq IV KCl, as well as 60 mEq po KCl today during chemo.   Iron deficiency anemia:  -Likely from esophageal malignancy/ulceration.  -s/p 2 doses of Feraheme on 03/02/17 & 03/09/17 for low ferritin of 9 and low iron saturations of 9%.  -Hgb 10.6 g/dL today, which is likely secondary to chemo.  Will add-on iron studies today and make arrangements for additional IV iron if needed.    Addendum:  -Ferritin 90; iron saturation low at 17%. Will make arrangements for 1 dose of IV Feraheme with next cycle of chemo. Nursing made aware.      Dispo:  -Continue FOLFOX every 2 weeks as scheduled.  -Restaging CT chest/abd/pelvis due after cycle #6; orders placed today.  -Return to cancer center a few days after scans for follow-up visit and subsequent chemo.     All  questions were answered to patient's stated satisfaction. Encouraged patient to call with any new concerns or questions before his next visit to the cancer center and we can certain see him sooner, if needed.    Plan of care discussed with Dr. Talbert Cage, who agrees with the above aforementioned.    Orders placed this encounter:  Orders Placed This Encounter  Procedures  . CT Chest W Contrast  . CT Abdomen Pelvis W Contrast      Maxwell Craze, NP [place] [phone]

## 2017-04-12 ENCOUNTER — Other Ambulatory Visit (HOSPITAL_COMMUNITY): Payer: Self-pay | Admitting: Adult Health

## 2017-04-13 ENCOUNTER — Encounter (HOSPITAL_COMMUNITY): Payer: Self-pay

## 2017-04-13 ENCOUNTER — Encounter (HOSPITAL_BASED_OUTPATIENT_CLINIC_OR_DEPARTMENT_OTHER): Payer: Self-pay

## 2017-04-13 VITALS — BP 136/90 | HR 81 | Temp 98.1°F | Resp 20 | Wt 208.1 lb

## 2017-04-13 DIAGNOSIS — C155 Malignant neoplasm of lower third of esophagus: Secondary | ICD-10-CM

## 2017-04-13 DIAGNOSIS — C159 Malignant neoplasm of esophagus, unspecified: Secondary | ICD-10-CM

## 2017-04-13 MED ORDER — HEPARIN SOD (PORK) LOCK FLUSH 100 UNIT/ML IV SOLN
500.0000 [IU] | Freq: Once | INTRAVENOUS | Status: AC | PRN
  Administered 2017-04-13: 500 [IU]

## 2017-04-13 MED ORDER — SODIUM CHLORIDE 0.9% FLUSH
10.0000 mL | INTRAVENOUS | Status: DC | PRN
  Administered 2017-04-13: 10 mL
  Filled 2017-04-13: qty 10

## 2017-04-13 MED ORDER — HEPARIN SOD (PORK) LOCK FLUSH 100 UNIT/ML IV SOLN
INTRAVENOUS | Status: AC
  Filled 2017-04-13: qty 5

## 2017-04-13 NOTE — Patient Instructions (Signed)
[place] at [facility] Discharge Instructions  RECOMMENDATIONS MADE BY THE CONSULTANT AND ANY TEST RESULTS WILL BE SENT TO YOUR REFERRING PHYSICIAN.  You had your home infusion pump removed today Follow up in 2 weeks with next treatment.  Thank you for choosing [place] at [facility] to provide your oncology and hematology care.  To afford each patient quality time with our provider, please arrive at least 15 minutes before your scheduled appointment time.    If you have a lab appointment with the [facility] please come in thru the  Main Entrance and check in at the main information desk  You need to re-schedule your appointment should you arrive 10 or more minutes late.  We strive to give you quality time with our providers, and arriving late affects you and other patients whose appointments are after yours.  Also, if you no show three or more times for appointments you may be dismissed from the clinic at the providers discretion.     Again, thank you for choosing [facility].  Our hope is that these requests will decrease the amount of time that you wait before being seen by our physicians.       _____________________________________________________________  Should you have questions after your visit to [facility], please contact our office at [phone] between the hours of 8:30 a.m. and 4:30 p.m.  Voicemails left after 4:30 p.m. will not be returned until the following business day.  For prescription refill requests, have your pharmacy contact our office.       Resources For Cancer Patients and their Caregivers ? American Cancer Society: Can assist with transportation, wigs, general needs, runs Look Good Feel Better.        (908)436-9512 ? Cancer Care: Provides financial assistance, online support groups, medication/co-pay assistance.  1-800-813-HOPE (334)054-3167) ? [facility] Assists [place] cancer patients and their families through emotional , educational and financial support.  364 596 5108 ? [place] DSS Where to apply for food stamps, Medicaid and utility assistance. 4508120913 ? [employer]: Transportation to medical appointments. 801-486-0348 ? Social Security Administration: May apply for disability if have a Stage IV cancer. 760-345-8496 984-818-1849 ? [employer], Disability and Transit Services: Assists with nutrition, care and transit needs. [place] Support Programs: @10RELATIVEDAYS @ > Cancer Support Group  2nd Tuesday of the month 1pm-2pm, Journey Room  > Creative Journey  3rd Tuesday of the month 1130am-1pm, Journey Room  > Look Good Feel Better  1st Wednesday of the month 10am-12 noon, Journey Room (Call [place] to register [phone])

## 2017-04-13 NOTE — Progress Notes (Signed)
Patient arrived at clinic with home infusion pump still running with 9.8 ml remaining in the bag. Remaining Flurourocil primed until remaining 0 remained in pump.  Pump disconnected from patient and port flushed with normal saline 10 ml and heparin 500 units.  Patient discharged in stable condition and ambulatory from clinic. Patient to follow up in 2 weeks with next treatment.

## 2017-04-23 ENCOUNTER — Other Ambulatory Visit (HOSPITAL_COMMUNITY): Payer: Self-pay | Admitting: Oncology

## 2017-04-25 ENCOUNTER — Encounter (HOSPITAL_COMMUNITY): Payer: Self-pay | Attending: Oncology

## 2017-04-25 ENCOUNTER — Encounter (HOSPITAL_COMMUNITY): Payer: Self-pay

## 2017-04-25 VITALS — BP 166/94 | HR 64 | Temp 98.0°F | Resp 16 | Wt 212.2 lb

## 2017-04-25 DIAGNOSIS — Z5111 Encounter for antineoplastic chemotherapy: Secondary | ICD-10-CM

## 2017-04-25 DIAGNOSIS — D509 Iron deficiency anemia, unspecified: Secondary | ICD-10-CM

## 2017-04-25 DIAGNOSIS — C160 Malignant neoplasm of cardia: Secondary | ICD-10-CM

## 2017-04-25 DIAGNOSIS — C159 Malignant neoplasm of esophagus, unspecified: Secondary | ICD-10-CM

## 2017-04-25 DIAGNOSIS — C155 Malignant neoplasm of lower third of esophagus: Secondary | ICD-10-CM

## 2017-04-25 DIAGNOSIS — D500 Iron deficiency anemia secondary to blood loss (chronic): Secondary | ICD-10-CM

## 2017-04-25 DIAGNOSIS — C779 Secondary and unspecified malignant neoplasm of lymph node, unspecified: Secondary | ICD-10-CM

## 2017-04-25 LAB — COMPREHENSIVE METABOLIC PANEL
ALK PHOS: 63 U/L (ref 38–126)
ALT: 20 U/L (ref 17–63)
ANION GAP: 8 (ref 5–15)
AST: 29 U/L (ref 15–41)
Albumin: 3.7 g/dL (ref 3.5–5.0)
BILIRUBIN TOTAL: 0.7 mg/dL (ref 0.3–1.2)
BUN: 7 mg/dL (ref 6–20)
CALCIUM: 8.9 mg/dL (ref 8.9–10.3)
CO2: 28 mmol/L (ref 22–32)
Chloride: 103 mmol/L (ref 101–111)
Creatinine, Ser: 0.89 mg/dL (ref 0.61–1.24)
Glucose, Bld: 136 mg/dL — ABNORMAL HIGH (ref 65–99)
Potassium: 3.7 mmol/L (ref 3.5–5.1)
SODIUM: 139 mmol/L (ref 135–145)
TOTAL PROTEIN: 6.5 g/dL (ref 6.5–8.1)

## 2017-04-25 LAB — CBC WITH DIFFERENTIAL/PLATELET
Basophils Absolute: 0 10*3/uL (ref 0.0–0.1)
Basophils Relative: 1 %
EOS PCT: 3 %
Eosinophils Absolute: 0.1 10*3/uL (ref 0.0–0.7)
HEMATOCRIT: 35 % — AB (ref 39.0–52.0)
HEMOGLOBIN: 11.7 g/dL — AB (ref 13.0–17.0)
LYMPHS ABS: 1.5 10*3/uL (ref 0.7–4.0)
LYMPHS PCT: 29 %
MCH: 23.8 pg — ABNORMAL LOW (ref 26.0–34.0)
MCHC: 33.4 g/dL (ref 30.0–36.0)
MCV: 71.1 fL — AB (ref 78.0–100.0)
MONO ABS: 0.8 10*3/uL (ref 0.1–1.0)
MONOS PCT: 16 %
NEUTROS ABS: 2.6 10*3/uL (ref 1.7–7.7)
Neutrophils Relative %: 52 %
Platelets: 147 10*3/uL — ABNORMAL LOW (ref 150–400)
RBC: 4.92 MIL/uL (ref 4.22–5.81)
RDW: 29 % — AB (ref 11.5–15.5)
WBC: 5 10*3/uL (ref 4.0–10.5)

## 2017-04-25 MED ORDER — SODIUM CHLORIDE 0.9 % IV SOLN
INTRAVENOUS | Status: DC
  Administered 2017-04-25: 10:00:00 via INTRAVENOUS

## 2017-04-25 MED ORDER — SODIUM CHLORIDE 0.9 % IV SOLN
510.0000 mg | Freq: Once | INTRAVENOUS | Status: AC
  Administered 2017-04-25: 510 mg via INTRAVENOUS
  Filled 2017-04-25: qty 17

## 2017-04-25 MED ORDER — FLUOROURACIL CHEMO INJECTION 2.5 GM/50ML
400.0000 mg/m2 | Freq: Once | INTRAVENOUS | Status: AC
  Administered 2017-04-25: 900 mg via INTRAVENOUS
  Filled 2017-04-25: qty 18

## 2017-04-25 MED ORDER — SODIUM CHLORIDE 0.9% FLUSH: 10.0000 mL | INTRAVENOUS | Status: DC | PRN

## 2017-04-25 MED ORDER — LEUCOVORIN CALCIUM INJECTION 350 MG
407.0000 mg/m2 | Freq: Once | INTRAVENOUS | Status: AC
  Administered 2017-04-25: 900 mg via INTRAVENOUS
  Filled 2017-04-25: qty 35

## 2017-04-25 MED ORDER — PALONOSETRON HCL INJECTION 0.25 MG/5ML
0.2500 mg | Freq: Once | INTRAVENOUS | Status: AC
  Administered 2017-04-25: 0.25 mg via INTRAVENOUS
  Filled 2017-04-25: qty 5

## 2017-04-25 MED ORDER — DEXTROSE 5 % IV SOLN
85.0000 mg/m2 | Freq: Once | INTRAVENOUS | Status: AC
  Administered 2017-04-25: 190 mg via INTRAVENOUS
  Filled 2017-04-25: qty 38

## 2017-04-25 MED ORDER — DEXAMETHASONE SODIUM PHOSPHATE 10 MG/ML IJ SOLN
10.0000 mg | Freq: Once | INTRAMUSCULAR | Status: AC
  Administered 2017-04-25: 10 mg via INTRAVENOUS
  Filled 2017-04-25: qty 1

## 2017-04-25 MED ORDER — DEXTROSE 5 % IV SOLN
Freq: Once | INTRAVENOUS | Status: AC
  Administered 2017-04-25: 11:00:00 via INTRAVENOUS

## 2017-04-25 MED ORDER — SODIUM CHLORIDE 0.9 % IV SOLN
2400.0000 mg/m2 | INTRAVENOUS | Status: DC
  Administered 2017-04-25: 5300 mg via INTRAVENOUS
  Filled 2017-04-25: qty 100

## 2017-04-25 NOTE — Progress Notes (Signed)
Labs reviewed today with MD. Proceed with treatment.  Patient complaining of headache this morning, has had this for 2 days. Does seem to be getting better this morning. Refusing any medication for this. Blood pressure is elevated-see flowsheet. Patient also complaining of his right toe being sore for some time now. Will let MD know.  Feraheme given today . Tolerated well without issues.  Treatment given per orders. Patient tolerated it well without problems. Vitals stable and discharged home from clinic ambulatory. Follow up as scheduled. Continuous 5FU pump connected per protocol.

## 2017-04-25 NOTE — Patient Instructions (Signed)
[facility] Discharge Instructions for Patients Receiving Chemotherapy   Beginning January 23rd 2017 lab work for the Cancer Center will be done in the  Main lab at [facility] on 1st floor. If you have a lab appointment with the Cancer Center please come in thru the  Main Entrance and check in at the main information desk   Today you received the following chemotherapy agents   To help prevent nausea and vomiting after your treatment, we encourage you to take your nausea medication     If you develop nausea and vomiting, or diarrhea that is not controlled by your medication, call the clinic.  The clinic phone number is [phone]. Office hours are Monday-Friday 8:30am-5:00pm.  BELOW ARE SYMPTOMS THAT SHOULD BE REPORTED IMMEDIATELY:  *FEVER GREATER THAN 101.0 F  *CHILLS WITH OR WITHOUT FEVER  NAUSEA AND VOMITING THAT IS NOT CONTROLLED WITH YOUR NAUSEA MEDICATION  *UNUSUAL SHORTNESS OF BREATH  *UNUSUAL BRUISING OR BLEEDING  TENDERNESS IN MOUTH AND THROAT WITH OR WITHOUT PRESENCE OF ULCERS  *URINARY PROBLEMS  *BOWEL PROBLEMS  UNUSUAL RASH Items with * indicate a potential emergency and should be followed up as soon as possible. If you have an emergency after office hours please contact your primary care physician or go to the nearest emergency department.  Please call the clinic during office hours if you have any questions or concerns.   You may also contact the Patient Navigator at [phone] should you have any questions or need assistance in obtaining follow up care.      Resources For Cancer Patients and their Caregivers ? American Cancer Society: Can assist with transportation, wigs, general needs, runs Look Good Feel Better.        1-888-227-6333 ? Cancer Care: Provides financial assistance, online support groups, medication/co-pay assistance.  1-800-813-HOPE (4673) ? [facility] Assists [place] cancer  patients and their families through emotional , educational and financial support.  336-427-4357 ? [place] DSS Where to apply for food stamps, Medicaid and utility assistance. 336-342-1394 ? [employer]: Transportation to medical appointments. 336-347-2287 ? Social Security Administration: May apply for disability if have a Stage IV cancer. 336-342-7796 1-800-772-1213 ? [place] Aging, Disability and Transit Services: Assists with nutrition, care and transit needs. 336-349-2343         

## 2017-04-27 ENCOUNTER — Encounter (HOSPITAL_BASED_OUTPATIENT_CLINIC_OR_DEPARTMENT_OTHER): Payer: Self-pay

## 2017-04-27 ENCOUNTER — Encounter (HOSPITAL_COMMUNITY): Payer: Self-pay

## 2017-04-27 VITALS — BP 124/77 | HR 82 | Temp 98.6°F | Resp 18

## 2017-04-27 DIAGNOSIS — C155 Malignant neoplasm of lower third of esophagus: Secondary | ICD-10-CM

## 2017-04-27 DIAGNOSIS — C160 Malignant neoplasm of cardia: Secondary | ICD-10-CM

## 2017-04-27 DIAGNOSIS — C779 Secondary and unspecified malignant neoplasm of lymph node, unspecified: Secondary | ICD-10-CM

## 2017-04-27 DIAGNOSIS — C159 Malignant neoplasm of esophagus, unspecified: Secondary | ICD-10-CM

## 2017-04-27 MED ORDER — HEPARIN SOD (PORK) LOCK FLUSH 100 UNIT/ML IV SOLN
500.0000 [IU] | Freq: Once | INTRAVENOUS | Status: AC | PRN
Start: 2017-04-27 — End: 2017-04-27
  Administered 2017-04-27: 500 [IU]

## 2017-04-27 MED ORDER — SODIUM CHLORIDE 0.9% FLUSH
10.0000 mL | INTRAVENOUS | Status: DC | PRN
  Administered 2017-04-27: 10 mL
  Filled 2017-04-27: qty 10

## 2017-04-27 NOTE — Patient Instructions (Signed)
[place] at [facility] Discharge Instructions  RECOMMENDATIONS MADE BY THE CONSULTANT AND ANY TEST RESULTS WILL BE SENT TO YOUR REFERRING PHYSICIAN.  Continuous 5FU pump disconnected today Follow up as scheduled.  Thank you for choosing [place] at [facility] to provide your oncology and hematology care.  To afford each patient quality time with our provider, please arrive at least 15 minutes before your scheduled appointment time.    If you have a lab appointment with the [facility] please come in thru the  Main Entrance and check in at the main information desk  You need to re-schedule your appointment should you arrive 10 or more minutes late.  We strive to give you quality time with our providers, and arriving late affects you and other patients whose appointments are after yours.  Also, if you no show three or more times for appointments you may be dismissed from the clinic at the providers discretion.     Again, thank you for choosing [facility].  Our hope is that these requests will decrease the amount of time that you wait before being seen by our physicians.       _____________________________________________________________  Should you have questions after your visit to [facility], please contact our office at [phone] between the hours of 8:30 a.m. and 4:30 p.m.  Voicemails left after 4:30 p.m. will not be returned until the following business day.  For prescription refill requests, have your pharmacy contact our office.       Resources For Cancer Patients and their Caregivers ? American Cancer Society: Can assist with transportation, wigs, general needs, runs Look Good Feel Better.        351-440-7086 ? Cancer Care: Provides financial assistance, online support groups, medication/co-pay assistance.  1-800-813-HOPE 660-169-3864) ? [employer] Assists [place]  cancer patients and their families through emotional , educational and financial support.  (971) 504-9123 ? [place] DSS Where to apply for food stamps, Medicaid and utility assistance. (857) 290-6461 ? [employer]: Transportation to medical appointments. 786-114-4665 ? Social Security Administration: May apply for disability if have a Stage IV cancer. (551) 648-3289 740-174-7745 ? [employer], Disability and Transit Services: Assists with nutrition, care and transit needs. [place] Support Programs: @10RELATIVEDAYS @ > Cancer Support Group  2nd Tuesday of the month 1pm-2pm, Journey Room  > Creative Journey  3rd Tuesday of the month 1130am-1pm, Journey Room  > Look Good Feel Better  1st Wednesday of the month 10am-12 noon, Journey Room (Call [place] to register [phone])

## 2017-04-27 NOTE — Progress Notes (Signed)
Maxwell Henderson returns today for port de access and flush after 46 hr continous infusion of 65fu. Tolerated infusion without problems. Portacath located right chest wall was  deaccessed and flushed with 37ml NS and 500U/35ml Heparin and needle removed intact.  Procedure without incident. Patient tolerated procedure well.  Treatment given per orders. Patient tolerated it well without problems. Vitals stable and discharged home from clinic ambulatory. Follow up as scheduled.

## 2017-05-02 ENCOUNTER — Ambulatory Visit (HOSPITAL_COMMUNITY)
Admission: RE | Admit: 2017-05-02 | Discharge: 2017-05-02 | Disposition: A | Discharge status: Home / Self Care | Payer: Self-pay | Source: Ambulatory Visit | Attending: Adult Health | Admitting: Adult Health

## 2017-05-02 ENCOUNTER — Other Ambulatory Visit (HOSPITAL_COMMUNITY): Payer: Self-pay | Admitting: Oncology

## 2017-05-02 DIAGNOSIS — R911 Solitary pulmonary nodule: Secondary | ICD-10-CM | POA: Insufficient documentation

## 2017-05-02 DIAGNOSIS — C159 Malignant neoplasm of esophagus, unspecified: Secondary | ICD-10-CM | POA: Insufficient documentation

## 2017-05-02 DIAGNOSIS — K8020 Calculus of gallbladder without cholecystitis without obstruction: Secondary | ICD-10-CM | POA: Insufficient documentation

## 2017-05-02 MED ORDER — MORPHINE SULFATE (CONCENTRATE) 10 MG /0.5 ML PO SOLN: 10.0000 mg | ORAL | 0 refills | Status: DC | PRN

## 2017-05-02 MED ORDER — IOPAMIDOL (ISOVUE-300) INJECTION 61%
100.0000 mL | Freq: Once | INTRAVENOUS | Status: AC | PRN
  Administered 2017-05-02: 100 mL via INTRAVENOUS

## 2017-05-03 ENCOUNTER — Encounter (HOSPITAL_COMMUNITY): Payer: Self-pay | Admitting: Adult Health

## 2017-05-03 NOTE — Progress Notes (Signed)
Foundation One ordered today with [facility] for Accession #: [id number].   [name], NP [place] [phone]

## 2017-05-09 ENCOUNTER — Other Ambulatory Visit (HOSPITAL_COMMUNITY): Payer: Self-pay | Admitting: Oncology

## 2017-05-09 ENCOUNTER — Encounter (HOSPITAL_BASED_OUTPATIENT_CLINIC_OR_DEPARTMENT_OTHER): Payer: Self-pay | Admitting: Oncology

## 2017-05-09 ENCOUNTER — Encounter (HOSPITAL_COMMUNITY): Payer: Self-pay

## 2017-05-09 DIAGNOSIS — R5383 Other fatigue: Secondary | ICD-10-CM

## 2017-05-09 DIAGNOSIS — C160 Malignant neoplasm of cardia: Secondary | ICD-10-CM

## 2017-05-09 DIAGNOSIS — C159 Malignant neoplasm of esophagus, unspecified: Secondary | ICD-10-CM

## 2017-05-09 DIAGNOSIS — C155 Malignant neoplasm of lower third of esophagus: Secondary | ICD-10-CM

## 2017-05-09 DIAGNOSIS — M109 Gout, unspecified: Secondary | ICD-10-CM

## 2017-05-09 DIAGNOSIS — D509 Iron deficiency anemia, unspecified: Secondary | ICD-10-CM

## 2017-05-09 DIAGNOSIS — C778 Secondary and unspecified malignant neoplasm of lymph nodes of multiple regions: Secondary | ICD-10-CM

## 2017-05-09 DIAGNOSIS — Z720 Tobacco use: Secondary | ICD-10-CM

## 2017-05-09 LAB — CBC WITH DIFFERENTIAL/PLATELET
Basophils Absolute: 0 10*3/uL (ref 0.0–0.1)
Basophils Relative: 0 %
EOS PCT: 3 %
Eosinophils Absolute: 0.2 10*3/uL (ref 0.0–0.7)
HEMATOCRIT: 33.7 % — AB (ref 39.0–52.0)
Hemoglobin: 11.3 g/dL — ABNORMAL LOW (ref 13.0–17.0)
LYMPHS ABS: 1.5 10*3/uL (ref 0.7–4.0)
Lymphocytes Relative: 27 %
MCH: 24.7 pg — AB (ref 26.0–34.0)
MCHC: 33.5 g/dL (ref 30.0–36.0)
MCV: 73.7 fL — AB (ref 78.0–100.0)
MONO ABS: 1 10*3/uL (ref 0.1–1.0)
MONOS PCT: 17 %
Neutro Abs: 3.1 10*3/uL (ref 1.7–7.7)
Neutrophils Relative %: 54 %
Platelets: 160 10*3/uL (ref 150–400)
RBC: 4.57 MIL/uL (ref 4.22–5.81)
WBC: 5.8 10*3/uL (ref 4.0–10.5)

## 2017-05-09 LAB — COMPREHENSIVE METABOLIC PANEL
ALT: 19 U/L (ref 17–63)
AST: 25 U/L (ref 15–41)
Albumin: 4 g/dL (ref 3.5–5.0)
Alkaline Phosphatase: 58 U/L (ref 38–126)
Anion gap: 10 (ref 5–15)
BILIRUBIN TOTAL: 0.8 mg/dL (ref 0.3–1.2)
BUN: 9 mg/dL (ref 6–20)
CALCIUM: 9 mg/dL (ref 8.9–10.3)
CHLORIDE: 102 mmol/L (ref 101–111)
CO2: 25 mmol/L (ref 22–32)
Creatinine, Ser: 0.92 mg/dL (ref 0.61–1.24)
Glucose, Bld: 128 mg/dL — ABNORMAL HIGH (ref 65–99)
POTASSIUM: 3.6 mmol/L (ref 3.5–5.1)
Sodium: 137 mmol/L (ref 135–145)
TOTAL PROTEIN: 7 g/dL (ref 6.5–8.1)

## 2017-05-09 MED ORDER — COLCHICINE 0.6 MG PO TABS: 0.6000 mg | ORAL_TABLET | Freq: Every day | ORAL | 1 refills | Status: DC

## 2017-05-09 MED ORDER — SODIUM CHLORIDE 0.9% FLUSH
10.0000 mL | INTRAVENOUS | Status: DC | PRN
  Administered 2017-05-09: 10 mL via INTRAVENOUS
  Filled 2017-05-09: qty 10

## 2017-05-09 MED ORDER — HEPARIN SOD (PORK) LOCK FLUSH 100 UNIT/ML IV SOLN
INTRAVENOUS | Status: AC
  Filled 2017-05-09: qty 5

## 2017-05-09 MED ORDER — PANTOPRAZOLE SODIUM 40 MG PO TBEC: 40.0000 mg | DELAYED_RELEASE_TABLET | Freq: Two times a day (BID) | ORAL | 3 refills | Status: DC

## 2017-05-09 MED ORDER — HEPARIN SOD (PORK) LOCK FLUSH 100 UNIT/ML IV SOLN
500.0000 [IU] | Freq: Once | INTRAVENOUS | Status: AC
  Administered 2017-05-09: 500 [IU] via INTRAVENOUS

## 2017-05-09 NOTE — Patient Instructions (Signed)
[facility] at [facility] Discharge Instructions  RECOMMENDATIONS MADE BY THE CONSULTANT AND ANY TEST RESULTS WILL BE SENT TO YOUR REFERRING PHYSICIAN.  You were seen today by Dr. Louise Zhou    Thank you for choosing [facility] at [facility] to provide your oncology and hematology care.  To afford each patient quality time with our provider, please arrive at least 15 minutes before your scheduled appointment time.    If you have a lab appointment with the Cancer Center please come in thru the  Main Entrance and check in at the main information desk  You need to re-schedule your appointment should you arrive 10 or more minutes late.  We strive to give you quality time with our providers, and arriving late affects you and other patients whose appointments are after yours.  Also, if you no show three or more times for appointments you may be dismissed from the clinic at the providers discretion.     Again, thank you for choosing [facility].  Our hope is that these requests will decrease the amount of time that you wait before being seen by our physicians.       _____________________________________________________________  Should you have questions after your visit to [facility], please contact our office at [phone] between the hours of 8:30 a.m. and 4:30 p.m.  Voicemails left after 4:30 p.m. will not be returned until the following business day.  For prescription refill requests, have your pharmacy contact our office.       Resources For Cancer Patients and their Caregivers ? American Cancer Society: Can assist with transportation, wigs, general needs, runs Look Good Feel Better.        1-888-227-6333 ? Cancer Care: Provides financial assistance, online support groups, medication/co-pay assistance.  1-800-813-HOPE (4673) ? [facility] Assists [place] cancer patients and their  families through emotional , educational and financial support.  336-427-4357 ? [place] DSS Where to apply for food stamps, Medicaid and utility assistance. 336-342-1394 ? [employer]: Transportation to medical appointments. 336-347-2287 ? Social Security Administration: May apply for disability if have a Stage IV cancer. 336-342-7796 1-800-772-1213 ? [place] [employer], Disability and Transit Services: Assists with nutrition, care and transit needs. 336-349-2343  Cancer Center Support Programs: @10RELATIVEDAYS@ > Cancer Support Group  2nd Tuesday of the month 1pm-2pm, Journey Room  > Creative Journey  3rd Tuesday of the month 1130am-1pm, Journey Room  > Look Good Feel Better  1st Wednesday of the month 10am-12 noon, Journey Room (Call American Cancer Society to register 1-800-395-5775)    

## 2017-05-09 NOTE — Progress Notes (Signed)
Tx held today d/t MD will be changing therapies based on pt's response to current chemotherapy regimen.  Pt is aware and verbalizes understanding of plan of care.  Discharged ambulatory in c/o spouse.

## 2017-05-09 NOTE — Progress Notes (Signed)
 [facility] [address] [place], [place] [address]   CLINIC:  Medical Oncology/Hematology  PCP:  House, Karen A, FNP 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 Sandi Fields. Partially obstructing, likely malignant esophageal tumor was found in the distal esophagus. Biopsied with surgical path demonstrating Adenocarcinoma. Injected. Treated with argon plasma coagulation (APC) #3. 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 Biopsied with surgical path demonstrating Adenocarcinoma.      01/19/2017 PET scan    NECK Two FDG avid nodes are seen in the base of the neck on the right. The first is seen on CT image 35, just posterior to the right thyroid lobe and the other is seen on CT image 41 measuring 16 mm in short axis with a maximum SUV of 8.6.  CHEST  The patient's known malignancy involving the distal esophagus and cardia of the stomach is FDG avid with a maximum SUV of 10.4. No definitive metastatic nodes within the chest.  ABDOMEN/PELVIS  FDG avid metastatic adenopathy is seen in the gastrohepatic ligament, paraceliac nodes, posterior to the IVC on image 111, and to the left of the SMA on image 112. The most inferior node is seen in the aortocaval region on image 117. The representative node to the left of the SMA was measured on series 4, image 111 measuring 2.5 by 2.1 cm with a maximum SUV of 7. No other FDG avid disease in the abdomen or pelvis. Cholelithiasis is identified.      02/02/2017 Procedure    US guided bx of right supraclavicular LN by IR.      02/05/2017 Pathology Results     Lymph node, needle/core biopsy, Right Supraclavicular - METASTATIC POORLY DIFFERENTIATED CARCINOMA.      02/10/2017 Pathology Results    HER2 - NEGATIVE      02/13/2017 Procedure    Port placed by IR      02/15/2017 - 04/25/2017 Chemotherapy    FOLFOX x 6 cycles       05/02/2017 Imaging    CT C/A/P: IMPRESSION: 1. Nodular thickening and hyperenhancement in the distal esophagus and gastric cardia could reflect residual tumor. 2. No significant change in size of previously demonstrated hypermetabolic upper abdominal lymph nodes. No progressive adenopathy identified. No residual enlarged supraclavicular nodes are seen. 3. No evidence of progressive metastatic disease. No worrisome hepatic findings. 4. Tiny right upper lobe pulmonary nodule, not clearly seen on low dose previous PET-CT. Attention on follow-up recommended. 5. Cholelithiasis.        INTERVAL HISTORY:  Mr. Ha 50 y.o. male returns for routine follow-up for esophageal cancer with his wife to review his restaging scans. Patient states that he recently is having pain and swelling in his right foot. He thinks it is a gout flare. He states that he has been eating a lot more red meat recently. Patient has been tolerating chemotherapy well with minimal side effects. He states that he can eat both solids and liquids although occasionally he states he feels like food still gets stuck. He denies any chest pain, shortness breath, abdominal pain, focal weakness. He continues to work.   He does have some fatigue.  He denies any neuropathy.  REVIEW OF SYSTEMS:  Review of Systems  Constitutional: Positive for fatigue. Negative for chills and fever.  HENT:  Negative.  Negative for lump/mass and nosebleeds.   Eyes: Negative.   Respiratory: Negative.  Negative for cough and shortness of breath.   Cardiovascular: Negative.  Negative for chest pain and leg swelling.  Gastrointestinal: Negative.  Negative for abdominal pain, blood in  stool, constipation, diarrhea, nausea and vomiting.  Endocrine: Negative.   Genitourinary: Negative.  Negative for dysuria and hematuria.   Musculoskeletal: Negative.  Negative for arthralgias.  Skin: Negative.  Negative for rash.  Neurological: Negative.  Negative for dizziness and headaches.  Hematological: Negative.  Negative for adenopathy. Does not bruise/bleed easily.  Psychiatric/Behavioral: Negative.  Negative for depression and sleep disturbance. The patient is not nervous/anxious.      PAST MEDICAL/SURGICAL HISTORY:  Past Medical History:  Diagnosis Date  . Diabetes mellitus without complication ([place])   . Hypertension   . 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 Primary esophageal adenocarcinoma ([place]) 01/29/2017   Past Surgical History:  Procedure Laterality Date  . ESOPHAGOGASTRODUODENOSCOPY (EGD) WITH PROPOFOL N/A 01/16/2017   Procedure: ESOPHAGOGASTRODUODENOSCOPY (EGD) WITH PROPOFOL;  Surgeon: [name], MD;  Location: [facility] SU[facility];  Service: Endoscopy;  Laterality: N/A;  730   . IR FLUORO GUIDE PORT INSERTION RIGHT  02/13/2017  . IR US GUIDE VASC ACCESS RIGHT  02/13/2017  . lipoma removal    . PORTA CATH INSERTION Right 02/13/2017  . SAVORY DILATION N/A 01/16/2017   Procedure: SAVORY DILATION;  Surgeon: [name], MD;  Location: [facility];  Service: Endoscopy;  Laterality: N/A;     SOCIAL HISTORY:  Social History   Social History  . Marital status: Married    Spouse name: N/A  . Number of children: N/A  . Years of education: N/A   Occupational History  . Not on file.   Social History Main Topics  . Smoking status: Current Some Day Smoker    Packs/day: 0.25    Types: Cigarettes  . Smokeless tobacco: Never Used  . Alcohol use No     Comment: hx heavy alcohol, hasn't drank in 19 yrs  . Drug use: Yes    Types: Marijuana     Comment: history of marijuana   . Sexual activity: Yes   Other Topics Concern  . Not on file    Social History Narrative  . No narrative on file    FAMILY HISTORY:  Family History  Problem Relation Age of Onset  . Prostate cancer Father   . Colon cancer Neg Hx   . Colon polyps Neg Hx     CURRENT MEDICATIONS:  Outpatient Encounter Prescriptions as of 05/09/2017  Medication Sig Note  . dexamethasone (DECADRON) 4 MG tablet Take 2 tablets (8 mg total) by mouth daily. Start the day after chemotherapy for 2 days. Take with food.   Marland Kitchen dextrose 5 % SOLN 1,000 mL with fluorouracil 5 GM/100ML SOLN Inject into the vein. Every 2 weeks, for 46 hours 02/13/2017: Patient not started  . Diphenhyd-Hydrocort-Nystatin (FIRST-DUKES MOUTHWASH) SUSP Use as directed 5 mLs in the mouth or throat 4 (four) times daily as needed.   Marland Kitchen LEUCOVORIN CALCIUM IV Inject into the vein. Every 2 weeks 02/13/2017: Patient not started  . loratadine (CLARITIN) 10 MG tablet Take 10 mg by mouth daily.   Marland Kitchen losartan (COZAAR) 25 MG tablet  Take 25 mg by mouth daily.   . metFORMIN (GLUCOPHAGE) 500 MG tablet Take 500 mg by mouth daily.   . Morphine Sulfate (MORPHINE CONCENTRATE) 10 mg / 0.5 ml concentrated solution Take 0.5 mLs (10 mg total) by mouth every 4 (four) hours as needed for severe pain.   . OXALIPLATIN IV Inject into the vein. Every 2 weeks 02/13/2017: Patient not started  . pantoprazole (PROTONIX) 40 MG tablet Take 1 tablet (40 mg total) by mouth daily. Take 30 minutes before breakfast   . polyethylene glycol powder (GLYCOLAX/MIRALAX) powder Take 1 capful daily.   . prochlorperazine (COMPAZINE) 10 MG tablet Take 1 tablet (10 mg total) by mouth every 6 (six) hours as needed (Nausea or vomiting).   . temazepam (RESTORIL) 30 MG capsule Take 1 capsule (30 mg total) by mouth at bedtime as needed for sleep.   . [DISCONTINUED] lidocaine-prilocaine (EMLA) cream Apply to affected area once 02/13/2017: Patient not started  . [DISCONTINUED] ondansetron (ZOFRAN) 8 MG tablet Take 1 tablet (8 mg total) by mouth 2 (two) times daily as  needed for refractory nausea / vomiting. Start on day 3 after chemotherapy. 02/13/2017: Patient not started   No facility-administered encounter medications on file as of 05/09/2017.     ALLERGIES:  No Known Allergies   PHYSICAL EXAM:  ECOG Performance status: 1 - Symptomatic; remains independent      Physical Exam  Constitutional: He is oriented to person, place, and time and well-developed, well-nourished, and in no distress.  HENT:  Head: Normocephalic.  Mouth/Throat: Oropharynx is clear and moist.  Eyes: Conjunctivae are normal. No scleral icterus.  Neck: Normal range of motion. Neck supple.  Cardiovascular: Normal rate and regular rhythm.   Pulmonary/Chest: Effort normal and breath sounds normal. No respiratory distress.  Abdominal: Soft. Bowel sounds are normal. There is no tenderness.  Musculoskeletal: Normal range of motion. He exhibits no edema.  Lymphadenopathy:    He has no cervical adenopathy.       Right: No supraclavicular adenopathy present.       Left: No supraclavicular adenopathy present.  Neurological: He is alert and oriented to person, place, and time. No cranial nerve deficit.  Skin: Skin is warm and dry. No rash noted.  Psychiatric: Mood, memory, affect and judgment normal.  Nursing note and vitals reviewed.    LABORATORY DATA:  I have reviewed the labs as listed.  CBC    Component Value Date/Time   WBC 5.0 04/25/2017 0807   RBC 4.92 04/25/2017 0807   HGB 11.7 (L) 04/25/2017 0807   HCT 35.0 (L) 04/25/2017 0807   PLT 147 (L) 04/25/2017 0807   MCV 71.1 (L) 04/25/2017 0807   MCH 23.8 (L) 04/25/2017 0807   MCHC 33.4 04/25/2017 0807   RDW 29.0 (H) 04/25/2017 0807   LYMPHSABS 1.5 04/25/2017 0807   MONOABS 0.8 04/25/2017 0807   EOSABS 0.1 04/25/2017 0807   BASOSABS 0.0 04/25/2017 0807   CMP Latest Ref Rng & Units 04/25/2017 04/11/2017 03/28/2017  Glucose 65 - 99 mg/dL 136(H) 177(H) 162(H)  BUN 6 - 20 mg/dL _0 Creatinine 0.61 - 1.24 mg/dL 0.89  0.86 0.94  Sodium 135 - 145 mmol/L 139 137 136  Potassium 3.5 - 5.1 mmol/L 3.7 2.8(L) 3.7  Chloride 101 - 111 mmol/L 103 101 102  CO2 22 - 32 mmol/L _1 Calcium 8.9 - 10.3 mg/dL 8.9 8.7(L) 8.7(L)  Total Protein 6.5 - 8.1 g/dL 6.5 6.6 6.3(L)  Total Bilirubin  0.3 - 1.2 mg/dL 0.7 0.5 0.8  Alkaline Phos 38 - 126 U/L 63 74 72  AST 15 - 41 U/L _0 ALT 17 - 63 U/L 20 32 41    PENDING LABS:    DIAGNOSTIC IMAGING:  *The following radiologic images and reports have been reviewed independently and agree with below findings.  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 Oneida Alar.  PET scan 01/19/17 revealed primary malignancy involving distal esophagus and gastric cardia, with nodes at base of right neck most consistent with metastatic disease; also metastatic nodes in upper abdomen. Underwent (R) supraclavicular lymph node biopsy on 01/16/17 revealing metastatic poorly differentiated carcinoma; HER2-.  He did not undergo EUS prior to treatment.  Started systemic treatment with FOLFOX chemotherapy on 02/15/17.  -Patient has completed 6 cycles of FOLFOX to date. His repeat CT C/A/P demonstrated partial response with resolution of his right supraclavicular lymphadenopathy, however he still has residual thickening/hyperenhancement in the distal esophagus/gastric cardia and no change in his abdominal lymphadenopathy. -Changing his treatment to cyramza with paclitaxel in an effort to get a better response. Plan to start next week. He will let me know what day of the week works best for him. I have reviewed scheduling of chemo and side effects in detail with him and he is willing to proceed.   Iron deficiency anemia:  -Likely from esophageal  malignancy/ulceration.  -s/p 2 doses of Feraheme on 03/02/17 & 03/09/17 for low ferritin of 9 . -Received 1 dose of feraheme on 04/25/17. Last hemoglobin 11.7 g/dL on 04/25/17.  Gout Flare: -Use naproxen. -Prescribed colchicine as well to help with his gout flare. -avoid too much red meats.  Dispo:  -RTC in 4 weeks for follow up.    All questions were answered to patient's stated satisfaction. Encouraged patient to call with any new concerns or questions before his next visit to the cancer center and we can certain see him sooner, if needed.    Twana First, MD

## 2017-05-09 NOTE — Patient Instructions (Addendum)
Maxwell Henderson   Your treatment plan is changing.  You will begin taking Cyramza and Taxol.  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  You will be treated on day 1, day 8 and day 15 every 28 days.  You will receive both drugs on days 1 and day 15.  You will only receive Taxol on day 8. So you will have treatment for 3 weeks and then have 1 week off.  Expect to be at the clinic around 4 hours on days 1 and 8.  You should only be here about 3 hours on day 8.  You will see the doctor regularly throughout treatment. We monitor your lab work prior to every treatment. The doctor monitors your response to treatment by the way you are feeling, your blood work, and scans periodically. There will be wait times while you are here for treatment. It will take about 30 minutes to 1 hour for your lab work to result. Then there will be wait times while pharmacy mixes your medications.     Premedications given before taxol include: Benadryl: help prevent any kind of reaction to the chemotherapy.  Will make you sleepy Pepcid:  An antihistamine premed. (takes 15 minutes to infuse)   Dexamethasone - steroid - given to reduce the risk of you having an allergic type reaction to the chemotherapy. Dex can cause you to feel energized, nervous/anxious/jittery, make you have trouble sleeping, and/or make you feel hot/flushed in the face/neck and/or look pink/red in the face/neck. These side effects will pass as the Dex wears off. (takes 20 minutes to infuse)  Premedications given before Cyramza include: Tylenol: help prevent any reaction to the chemotherapy. Benadryl: help prevent any reaction to the chemotherapy.  Paclitaxel (Taxol)   About This Drug Paclitaxel is a drug used to treat cancer. It is given in the vein (IV). It takes one hour (60 minutes) to infuse.  It will take a little longer for the first infusion, so we can monitor  for any reactions.  Every treatment after that will take one hour.    Possible Side Effects . Hair loss. Hair loss is often temporary, although with certain medicine, hair loss can sometimes be permanent. Hair loss may happen suddenly or gradually. If you lose hair, you may lose it from your head, face, armpits, pubic area, chest, and/or legs. You may also notice your hair getting thin. . Swelling of your legs, ankles and/or feet (edema) . Flushing . Nausea and throwing up (vomiting) . Loose bowel movements (diarrhea) . Bone marrow depression. This is a decrease in the number of white blood cells, red blood cells, and platelets. This may raise your risk of infection, make you tired and weak (fatigue), and raise your risk of bleeding. . Effects on the nerves are called peripheral neuropathy. You may feel numbness, tingling, or pain in your hands and feet. It may be hard for you to button your clothes, open jars, or walk as usual. The effect on the nerves may get worse with more doses of the drug. These effects get better in some people after the drug is stopped but it does not get better in all people. . Changes in your liver function . Bone, joint and muscle pain . Abnormal EKG . Allergic reaction: Allergic reactions, including anaphylaxis are rare but may happen in some patients. Signs of allergic reaction to this drug may be swelling of the  face, feeling like your tongue or throat are swelling, trouble breathing, rash, itching, fever, chills, feeling dizzy, and/or feeling that your heart is beating in a fast or not normal way. If this happens, do not take another dose of this drug. You should get urgent medical treatment. . Infection . Changes in your kidney function. Note: Each of the side effects above was reported in 20% or greater of patients treated with paclitaxel. Not all possible side effects are included above.  Warnings and Precautions . Severe allergic reactions . Severe  bone marrow depression  Treating Side Effects . To help with hair loss, wash with a mild shampoo and avoid washing your hair every day. . Avoid rubbing your scalp, instead, pat your hair or scalp dry . Avoid coloring your hair . Limit your use of hair spray, electric curlers, blow dryers, and curling irons. . If you are interested in getting a wig, talk to your nurse. You can also call the [facility] at 800-ACS-2345 to find out information about the "Look Good, Feel Better" program close to where you live. It is a free program where women getting chemotherapy can learn about wigs, turbans and scarves as well as makeup techniques and skin and nail care. . Ask your doctor or nurse about medicines that are available to help stop or lessen diarrhea and/or nausea. . To help with nausea and vomiting, eat small, frequent meals instead of three large meals a day. Choose foods and drinks that are at room temperature. Ask your nurse or doctor about other helpful tips and medicine that is available to help or stop lessen these symptoms. . If you get diarrhea, eat low-fiber foods that are high in protein and calories and avoid foods that can irritate your digestive tracts or lead to cramping. Ask your nurse or doctor about medicine that can lessen or stop your diarrhea. . Mouth care is very important. Your mouth care should consist of routine, gentle cleaning of your teeth or dentures and rinsing your mouth with a mixture of 1/2 teaspoon of salt in 8 ounces of water or  teaspoon of baking soda in 8 ounces of water. This should be done at least after each meal and at bedtime. . If you have mouth sores, avoid mouthwash that has alcohol. Also avoid alcohol and smoking because they can bother your mouth and throat. . Drink plenty of fluids (a minimum of eight glasses per day is recommended). . Take your temperature as your doctor or nurse tells you, and whenever you feel like you may have a  fever. . Talk to your doctor or nurse about precautions you can take to avoid infections and bleeding. . Be careful when cooking, walking, and handling sharp objects and hot liquids.  Food and Drug Interactions . There are no known interactions of paclitaxel with food. . This drug may interact with other medicines. Tell your doctor and pharmacist about all the medicines and dietary supplements (vitamins, minerals, herbs and others) that you are taking at this time. . The safety and use of dietary supplements and alternative diets are often not known. Using these might affect your cancer or interfere with your treatment. Until more is known, you should not use dietary supplements or alternative diets without your cancer doctor's help.  When to Call the Doctor Call your doctor or nurse if you have any of the following symptoms and/or any new or unusual symptoms: . Fever of 100.5 F (38 C) or above . Chills .  Redness, pain, warmth, or swelling at the IV site during the infusion . Signs of allergic reaction: swelling of the face, feeling like your tongue or throat are swelling, trouble breathing, rash, itching, fever, chills, feeling dizzy, and/or feeling that your heart is beating in a fast or not normal way . Feeling that your heart is beating in a fast or not normal way (palpitations) . Weight gain of 5 pounds in one week (fluid retention) . Decreased urine or very dark urine . Signs of liver problems: dark urine, pale bowel movements, bad stomach pain, feeling very tired and weak, unusual itching, or yellowing of the eyes or skin . Heavy menstrual period that lasts longer than normal . Easy bruising or bleeding . Nausea that stops you from eating or drinking, and/or that is not relieved by prescribed medicines. . Loose bowel movements (diarrhea) more than 4 times a day or diarrhea with weakness or lightheadedness . Pain in your mouth or throat that makes it hard to eat or drink .  Lasting loss of appetite or rapid weight loss of five pounds in a week . Signs of peripheral neuropathy: numbness, tingling, or decreased feeling in fingers or toes; trouble walking or changes in the way you walk; or feeling clumsy when buttoning clothes, opening jars, or other routine activities . Joint and muscle pain that is not relieved by prescribed medicines . Extreme fatigue that interferes with normal activities . While you are getting this drug, please tell your nurse right away if you have any pain, redness, or swelling at the site of the IV infusion. . If you think you are pregnant.  Reproduction Warnings . Pregnancy warning: This drug may have harmful effects on the unborn child, it is recommended that effective methods of birth control should be used during your cancer treatment. Let your doctor know right away if you think you may be pregnant. . Breast feeding warning: Women should not breast feed during treatment because this drug could enter the breast milk and cause harm to a breast feeding baby.   Ramucirumab (Cyramza)  About This Drug Ramucirumab is used to treat cancer. It is given in the vein (IV)  Possible Side Effects . High blood pressure . Loose bowel movements (diarrhea) Note: Each of the side effects above was reported in 10% or greater of patients treated with ramucirumab. Not all possible side effects are included above. You may have different side effects if you are receiving ramucirumab in combination with other chemotherapy agents.  Warnings and Precautions . Abnormal bleeding which can very rarely be life-threatening - symptoms may be coughing up blood, throwing up blood (may look like coffee grounds), red or black tarry bowel movements, abnormally heavy menstrual flow, nosebleeds or any other unusual bleeding. Marland Kitchen Perforation -a hole in your stomach, small and/or large intestine. . Slow wound healing . Blood clots and events such as stroke and  heart attack, which can be life-threatening. A blood clot in your leg may cause your leg to swell, appear red and warm, and/or cause pain. A blood clot in your lungs may cause trouble breathing, pain when breathing, and/or chest pain . While you are getting this drug in your vein (IV), you may have a reaction to the drug. Sometimes you may be given medication to stop or lessen these side effects. Your nurse will check you closely for these signs: fever or shaking chills, flushing, facial swelling, feeling dizzy, headache, trouble breathing, rash, itching, chest tightness, or chest pain. These  reactions may happen after your infusion. If this happens, call 911 for emergency care. . Changes in your central nervous system can happen. The central nervous system is made up of your brain and spinal cord. You could feel extreme tiredness, agitation, confusion, hallucinations (see or hear things that are not there), have trouble understanding or speaking, loss of control of your bowels or bladder, eyesight changes, numbness or lack of strength to your arms, legs, face, or body, seizures or coma. If you start to have any of these symptoms let your doctor know right away. . Severe high blood pressure . Your kidneys may be affected causing protein in your urine . Patients with existing liver condition called cirrhosis may have worsening symptoms . Changes in your thyroid function Note: Some of the side effects above are very rare. If you have concerns and/or questions, please discuss them with your medical team.  Important Information . This drug may be present in the saliva, tears, sweat, urine, stool, vomit, semen, and vaginal secretions. Talk to your doctor and/or your nurse about the necessary precautions to take during this time. . Ramucirumab may cause slow wound healing. It should not be given before surgery. If you must have emergency surgery or have an accident that results in a wound, tell  the doctor that you are on ramucirumab.  Treating Side Effects . Drink plenty of fluids (a minimum of eight glasses per day is recommended). . If you throw up or have loose bowel movements, you should drink more fluids so that you do not become dehydrated (lack water in the body from losing too much fluid). . If you get diarrhea, eat low-fiber foods that are high in protein and calories and avoid foods that can irritate your digestive tracts or lead to cramping. . Ask your nurse or doctor about medicine that can lessen or stop your diarrhea . Infusion reactions may occur after your infusion. If this happens, call 911 for emergency care.  Food and Drug Interactions . There are no known interactions of ramucirumab with food and other medications. . Tell your doctor and pharmacist about all the prescription and over-the-counter medicines and dietary supplements (vitamins, minerals, herbs and others) that you are taking at this time. Also, check with your doctor or pharmacist before starting any new prescription or over-the-counter medicines, or dietary supplements to make sure that there are no interactions. . The safety and use of dietary supplements and alternative diets are often not known. Using these might affect your cancer or interfere with your treatment. Until more is known, you should not use dietary supplements or alternative diets without your cancer doctor's help.  When to Call the Doctor Call your doctor or nurse if you have any of these symptoms and/or any new or unusual symptoms: . Fever of 100.5 F (38 C) or higher . Chills . Confusion and/or agitation . Seizures . Hallucinations . Trouble understanding or speaking . Blurry vision or changes in your eyesight . A headache that does not go away . Numbness or lack of strength to your arms, legs, face, or body . Easy bleeding or bruising . Wheezing and/or trouble breathing . Chest pain or symptoms of a heart attack. Most  heart attacks involve pain in the center of the chest that lasts more than a few minutes. The pain may go away and come back. It can feel like pressure, squeezing, fullness, or pain. Sometimes pain is felt in one or both arms, the back, neck, jaw, or  stomach. If any of these symptoms last 2 minutes, call 911. Marland Kitchen Symptoms of a stroke such as sudden numbness or weakness of your face, arm, or leg, mostly on one side of your body; sudden confusion, trouble speaking or understanding; sudden trouble seeing in one or both eyes; sudden trouble walking, feeling dizzy, loss of balance or coordination; or sudden, bad headache with no known cause. If you have any of these symptoms for 2 minutes, call 911. . Blood in your urine, vomit (bright red or coffee-ground) and/or stools ( bright red, or black/tarry) . Coughing up blood . Fatigue that interferes with your daily activities . Unexplained weight gain . Loose bowel movements (diarrhea) 4 times a day or loose bowel movements with lack of strength or a feeling of being dizzy . Severe abdominal pain that does not go away . Decreased urine . Signs of infusion reaction: fever or shaking chills, flushing, facial swelling, feeling dizzy, headache, trouble breathing, rash, itching, chest tightness, or chest pain. . Your leg or arm is swollen, red, warm and/or painful . If you think you may be pregnant  Reproduction Warnings . Pregnancy warning: This drug can have harmful effects on the unborn baby. Women of child bearing potential should use effective methods of birth control during your cancer treatment and for at least 3 months after treatment. Let your doctor know right away if you think you may be pregnant . Breastfeeding warning: Women should not breastfeed during treatment because this drug could enter the breast milk and cause harm to a breastfeeding baby. . Fertility warning: In women, this drug may affect your ability to have children in the  future. Talk with your doctor or nurse if you plan to have children. Ask for information on egg banking.   MEDICATIONS:   Zofran/Ondansetron 8mg  tablet. Take 1 tablet every 8 hours as needed for nausea/vomiting. (#1 nausea med to take, this can constipate)  Compazine/Prochlorperazine 10mg  tablet. Take 1 tablet every 6 hours as needed for nausea/vomiting. (#2 nausea med to take, this can make you sleepy)   Over-the-Counter Meds:  Miralax1 capful in 8 oz of fluid daily. May increase to two times a day if needed. This is a stool softener. If this doesn't work proceed you can add:  Senokot S-start with 1 tablet two times a day and increase to 4 tablets two times a day if needed. (total of 8 tablets in a 24 hour period). This is a stimulant laxative.   Call us if this does not help your bowels move.   Imodium2mg  capsule. Take 2 capsules after the 1st loose stool and then 1 capsule every 2 hours until you go a total of 12 hours without having a loose stool. Call the [facility] if loose stools continue. If diarrhea occurs @ bedtime, take 2 capsules @ bedtime. Then take 2 capsules every 4 hours until morning. Call [facility].     Diarrhea Sheet  If you are having loose stools/diarrhea, please purchase Imodium and begin taking as outlined: At the first sign of poorly formed or loose stools you should begin taking Imodium(loperamide) 2 mg capsules. Take two caplets (4mg ) followed by one caplet (2mg ) every 2 hours until you have had no diarrhea for 12 hours. During the night take two caplets (4mg ) at bedtime and continue every 4 hours during the night until the morning. Stop taking Imodium only after there is no sign of diarrhea for 12 hours.   Always call the [facility] if you  are having loose stools/diarrhea that you can't get under control. Loose stools/disrrhea leads to dehydration (loss of water) in your body. We have other options of trying to get the loose  stools/diarrhea to stopped but you must let us know!     Constipation Sheet *Miralax in 8 oz of fluid daily. May increase to two times a day if needed. This is a stool softener. If this not enough to keep your bowel regular:  You can add:  *Senokot S, start with one tablet twice a day and can increase to 4 tablets twice a day if needed. This is a stimulant laxative.   Sometimes when you take pain medication you need BOTH a medicine to keep your stool soft and a medicine to help your bowel push it out!  Please call if the above does not work for you.   Do not go more than 2 days without a bowel movement. It is very important that you do not become constipated. It will make you feel sick to your stomach (nausea) and can cause abdominal pain and vomiting.     Nausea Sheet  Zofran/Ondansetron8mg  tablet. Take 1 tablet every 8 hours as needed for nausea/vomiting. (#1 nausea med to take, this can constipate)  Compazine/Prochlorperazine10mg  tablet. Take 1 tablet every 6 hours as needed for nausea/vomiting. (#2 nausea med to take, this can make you sleepy)  You can take these medications together or separately. We would first like for you to try the Ondansetron by itself and then take the Prochloperizine if needed. But you are allowed to take both medications at the same time if your nausea is that severe. If you are having persistent nausea (nausea that does not stop) please take these medications on a staggered schedule so that the nausea medication stays in your body. Please call the [facility] and let us know the amount of nausea that you are experiencing. If you begin to vomit, you need to call the [facility] and if it is the weekend and you have vomited more than one time and cant get it to stop-go to the Emergency Room. Persistent nausea/vomiting can lead to dehydration (loss of fluid in your body) and will make you feel terrible.   Ice chips, sips of  clear liquids, foods that are @ room temperature, crackers, and toast tend to be better tolerated.     SYMPTOMS TO REPORT AS SOON AS POSSIBLE AFTER TREATMENT:  FEVER GREATER THAN 100.5 F  CHILLS WITH OR WITHOUT FEVER  NAUSEA AND VOMITING THAT IS NOT CONTROLLED WITH YOUR NAUSEA MEDICATION  UNUSUAL SHORTNESS OF BREATH  UNUSUAL BRUISING OR BLEEDING  TENDERNESS IN MOUTH AND THROAT WITH OR WITHOUT PRESENCE OF ULCERS  URINARY PROBLEMS  BOWEL PROBLEMS  UNUSUAL RASH   Wear comfortable clothing and clothing appropriate for easy access to any Portacath or PICC line. Let us know if there is anything that we can do to make your therapy better!    What to do if you need assistance after hours or on the weekends: CALL [phone]. HOLD on the line, do not hang up. You will hear multiple messages but at the end you will be connected with a nurse triage line. They will contact the doctor if necessary. Most of the time they will be able to assist you. Do not call the hospital operator.     I have been informed and understand all of the instructions given to me and have received a copy. I have been instructed to  call the clinic [phone] or my family physician as soon as possible for continued medical care, if indicated. I do not have any more questions at this time but understand that I may call the [facility] or the Patient Navigator at [phone] during office hours should I have questions or need assistance in obtaining follow-up care.

## 2017-05-11 ENCOUNTER — Encounter (HOSPITAL_COMMUNITY): Payer: Self-pay

## 2017-05-11 ENCOUNTER — Telehealth (HOSPITAL_COMMUNITY): Payer: Self-pay | Admitting: Oncology

## 2017-05-11 NOTE — Telephone Encounter (Signed)
PT APPROVED FOR [facility] PT ASST FOR CYRAMZA   PT #[id number] 05/11/17-05/11/18. [phone] P

## 2017-05-18 ENCOUNTER — Encounter (HOSPITAL_COMMUNITY): Payer: Self-pay

## 2017-05-18 ENCOUNTER — Encounter (HOSPITAL_COMMUNITY): Payer: Self-pay | Attending: Oncology

## 2017-05-18 VITALS — BP 157/95 | HR 78 | Temp 98.2°F | Resp 16 | Wt 206.0 lb

## 2017-05-18 DIAGNOSIS — E876 Hypokalemia: Secondary | ICD-10-CM | POA: Insufficient documentation

## 2017-05-18 DIAGNOSIS — C778 Secondary and unspecified malignant neoplasm of lymph nodes of multiple regions: Secondary | ICD-10-CM

## 2017-05-18 DIAGNOSIS — C155 Malignant neoplasm of lower third of esophagus: Secondary | ICD-10-CM

## 2017-05-18 DIAGNOSIS — C160 Malignant neoplasm of cardia: Secondary | ICD-10-CM

## 2017-05-18 DIAGNOSIS — Z5112 Encounter for antineoplastic immunotherapy: Secondary | ICD-10-CM

## 2017-05-18 DIAGNOSIS — C159 Malignant neoplasm of esophagus, unspecified: Secondary | ICD-10-CM | POA: Insufficient documentation

## 2017-05-18 DIAGNOSIS — D509 Iron deficiency anemia, unspecified: Secondary | ICD-10-CM | POA: Insufficient documentation

## 2017-05-18 DIAGNOSIS — G4701 Insomnia due to medical condition: Secondary | ICD-10-CM | POA: Insufficient documentation

## 2017-05-18 DIAGNOSIS — Z5111 Encounter for antineoplastic chemotherapy: Secondary | ICD-10-CM

## 2017-05-18 LAB — CBC WITH DIFFERENTIAL/PLATELET
Basophils Absolute: 0 K/uL (ref 0.0–0.1)
Basophils Relative: 1 %
Eosinophils Absolute: 0.1 K/uL (ref 0.0–0.7)
Eosinophils Relative: 2 %
HCT: 34.6 % — ABNORMAL LOW (ref 39.0–52.0)
Hemoglobin: 11.7 g/dL — ABNORMAL LOW (ref 13.0–17.0)
Lymphocytes Relative: 34 %
Lymphs Abs: 1.5 K/uL (ref 0.7–4.0)
MCH: 25.5 pg — ABNORMAL LOW (ref 26.0–34.0)
MCHC: 33.8 g/dL (ref 30.0–36.0)
MCV: 75.5 fL — ABNORMAL LOW (ref 78.0–100.0)
Monocytes Absolute: 0.4 K/uL (ref 0.1–1.0)
Monocytes Relative: 10 %
Neutro Abs: 2.4 K/uL (ref 1.7–7.7)
Neutrophils Relative %: 53 %
Platelets: 250 K/uL (ref 150–400)
RBC: 4.58 MIL/uL (ref 4.22–5.81)
RDW: 26.6 % — ABNORMAL HIGH (ref 11.5–15.5)
WBC: 4.4 K/uL (ref 4.0–10.5)

## 2017-05-18 LAB — COMPREHENSIVE METABOLIC PANEL WITH GFR
ALT: 14 U/L — ABNORMAL LOW (ref 17–63)
AST: 20 U/L (ref 15–41)
Albumin: 4.1 g/dL (ref 3.5–5.0)
Alkaline Phosphatase: 56 U/L (ref 38–126)
Anion gap: 9 (ref 5–15)
BUN: 9 mg/dL (ref 6–20)
CO2: 26 mmol/L (ref 22–32)
Calcium: 9.2 mg/dL (ref 8.9–10.3)
Chloride: 102 mmol/L (ref 101–111)
Creatinine, Ser: 0.84 mg/dL (ref 0.61–1.24)
GFR calc Af Amer: >60 mL/min
GFR calc non Af Amer: >60 mL/min
Glucose, Bld: 121 mg/dL — ABNORMAL HIGH (ref 65–99)
Potassium: 3.4 mmol/L — ABNORMAL LOW (ref 3.5–5.1)
Sodium: 137 mmol/L (ref 135–145)
Total Bilirubin: 0.7 mg/dL (ref 0.3–1.2)
Total Protein: 7.2 g/dL (ref 6.5–8.1)

## 2017-05-18 MED ORDER — RAMUCIRUMAB CHEMO INJECTION 500 MG/50ML
8.0000 mg/kg | Freq: Once | INTRAVENOUS | Status: AC
  Administered 2017-05-18: 800 mg via INTRAVENOUS
  Filled 2017-05-18: qty 50

## 2017-05-18 MED ORDER — SODIUM CHLORIDE 0.9 % IV SOLN
Freq: Once | INTRAVENOUS | Status: AC
  Administered 2017-05-18: 10:00:00 via INTRAVENOUS

## 2017-05-18 MED ORDER — FAMOTIDINE IN NACL 20-0.9 MG/50ML-% IV SOLN
INTRAVENOUS | Status: AC
  Filled 2017-05-18: qty 50

## 2017-05-18 MED ORDER — HEPARIN SOD (PORK) LOCK FLUSH 100 UNIT/ML IV SOLN
INTRAVENOUS | Status: AC
Start: 2017-05-18 — End: 2017-05-18
  Filled 2017-05-18: qty 5

## 2017-05-18 MED ORDER — DIPHENHYDRAMINE HCL 50 MG/ML IJ SOLN
INTRAMUSCULAR | Status: AC
Start: 2017-05-18 — End: ?
  Filled 2017-05-18: qty 1

## 2017-05-18 MED ORDER — DIPHENHYDRAMINE HCL 50 MG/ML IJ SOLN
50.0000 mg | Freq: Once | INTRAMUSCULAR | Status: AC
  Administered 2017-05-18: 50 mg via INTRAVENOUS

## 2017-05-18 MED ORDER — POTASSIUM CHLORIDE CRYS ER 20 MEQ PO TBCR
40.0000 meq | EXTENDED_RELEASE_TABLET | Freq: Once | ORAL | Status: AC
  Administered 2017-05-18: 40 meq via ORAL

## 2017-05-18 MED ORDER — ACETAMINOPHEN 325 MG PO TABS
650.0000 mg | ORAL_TABLET | Freq: Once | ORAL | Status: AC
  Administered 2017-05-18: 650 mg via ORAL

## 2017-05-18 MED ORDER — SODIUM CHLORIDE 0.9 % IV SOLN: 10.0000 mg | Freq: Once | INTRAVENOUS | Status: DC

## 2017-05-18 MED ORDER — DEXAMETHASONE SODIUM PHOSPHATE 10 MG/ML IJ SOLN
10.0000 mg | Freq: Once | INTRAMUSCULAR | Status: AC
  Administered 2017-05-18: 10 mg via INTRAVENOUS

## 2017-05-18 MED ORDER — ACETAMINOPHEN 325 MG PO TABS
ORAL_TABLET | ORAL | Status: AC
  Filled 2017-05-18: qty 2

## 2017-05-18 MED ORDER — HEPARIN SOD (PORK) LOCK FLUSH 100 UNIT/ML IV SOLN
500.0000 [IU] | Freq: Once | INTRAVENOUS | Status: AC | PRN
  Administered 2017-05-18: 500 [IU]
  Filled 2017-05-18 (×2): qty 5

## 2017-05-18 MED ORDER — POTASSIUM CHLORIDE CRYS ER 20 MEQ PO TBCR
EXTENDED_RELEASE_TABLET | ORAL | Status: AC
  Filled 2017-05-18: qty 2

## 2017-05-18 MED ORDER — SODIUM CHLORIDE 0.9% FLUSH
10.0000 mL | INTRAVENOUS | Status: DC | PRN
  Administered 2017-05-18: 10 mL
  Filled 2017-05-18: qty 10

## 2017-05-18 MED ORDER — FAMOTIDINE IN NACL 20-0.9 MG/50ML-% IV SOLN
20.0000 mg | Freq: Once | INTRAVENOUS | Status: AC
  Administered 2017-05-18: 20 mg via INTRAVENOUS

## 2017-05-18 MED ORDER — DEXAMETHASONE SODIUM PHOSPHATE 10 MG/ML IJ SOLN
INTRAMUSCULAR | Status: AC
  Filled 2017-05-18: qty 1

## 2017-05-18 MED ORDER — PACLITAXEL CHEMO INJECTION 300 MG/50ML
80.0000 mg/m2 | Freq: Once | INTRAVENOUS | Status: AC
  Administered 2017-05-18: 174 mg via INTRAVENOUS
  Filled 2017-05-18: qty 29

## 2017-05-18 NOTE — Patient Instructions (Signed)
[facility] Discharge Instructions for Patients Receiving Chemotherapy   Beginning January 23rd 2017 lab work for the Cancer Center will be done in the  Main lab at [facility] on 1st floor. If you have a lab appointment with the Cancer Center please come in thru the  Main Entrance and check in at the main information desk   Today you received the following chemotherapy agents   To help prevent nausea and vomiting after your treatment, we encourage you to take your nausea medication     If you develop nausea and vomiting, or diarrhea that is not controlled by your medication, call the clinic.  The clinic phone number is [phone]. Office hours are Monday-Friday 8:30am-5:00pm.  BELOW ARE SYMPTOMS THAT SHOULD BE REPORTED IMMEDIATELY:  *FEVER GREATER THAN 101.0 F  *CHILLS WITH OR WITHOUT FEVER  NAUSEA AND VOMITING THAT IS NOT CONTROLLED WITH YOUR NAUSEA MEDICATION  *UNUSUAL SHORTNESS OF BREATH  *UNUSUAL BRUISING OR BLEEDING  TENDERNESS IN MOUTH AND THROAT WITH OR WITHOUT PRESENCE OF ULCERS  *URINARY PROBLEMS  *BOWEL PROBLEMS  UNUSUAL RASH Items with * indicate a potential emergency and should be followed up as soon as possible. If you have an emergency after office hours please contact your primary care physician or go to the nearest emergency department.  Please call the clinic during office hours if you have any questions or concerns.   You may also contact the Patient Navigator at [phone] should you have any questions or need assistance in obtaining follow up care.      Resources For Cancer Patients and their Caregivers ? American Cancer Society: Can assist with transportation, wigs, general needs, runs Look Good Feel Better.        1-888-227-6333 ? Cancer Care: Provides financial assistance, online support groups, medication/co-pay assistance.  1-800-813-HOPE (4673) ? [facility] Assists [place] cancer  patients and their families through emotional , educational and financial support.  336-427-4357 ? [place] DSS Where to apply for food stamps, Medicaid and utility assistance. 336-342-1394 ? [facility]: Transportation to medical appointments. 336-347-2287 ? Social Security Administration: May apply for disability if have a Stage IV cancer. 336-342-7796 1-800-772-1213 ? [place] Aging, Disability and Transit Services: Assists with nutrition, care and transit needs. 336-349-2343         

## 2017-05-18 NOTE — Progress Notes (Signed)
Chemotherapy consent obtained, education packet reviewed with patient . Patient verbalized understanding. Labs drawn.   Labs reviewed with MD. Will give potassium PO as ordered. Elevated BP noted to MD. No new orders at this time.  Treatment given per orders. Patient tolerated it well without problems. Vitals stable and discharged home from clinic ambulatory. Follow up as scheduled.

## 2017-05-21 ENCOUNTER — Telehealth (HOSPITAL_COMMUNITY): Payer: Self-pay

## 2017-05-21 NOTE — Telephone Encounter (Signed)
24 hour follow up -patient states that he is doing good. No problems at this time. He knows to call us if he has any concerns or questions.

## 2017-05-25 ENCOUNTER — Encounter (HOSPITAL_COMMUNITY): Payer: Self-pay

## 2017-05-25 ENCOUNTER — Encounter (HOSPITAL_BASED_OUTPATIENT_CLINIC_OR_DEPARTMENT_OTHER): Payer: Self-pay

## 2017-05-25 VITALS — BP 129/83 | HR 56 | Temp 97.7°F | Resp 16 | Wt 210.2 lb

## 2017-05-25 DIAGNOSIS — C160 Malignant neoplasm of cardia: Secondary | ICD-10-CM

## 2017-05-25 DIAGNOSIS — C779 Secondary and unspecified malignant neoplasm of lymph node, unspecified: Secondary | ICD-10-CM

## 2017-05-25 DIAGNOSIS — Z5111 Encounter for antineoplastic chemotherapy: Secondary | ICD-10-CM

## 2017-05-25 DIAGNOSIS — C159 Malignant neoplasm of esophagus, unspecified: Secondary | ICD-10-CM

## 2017-05-25 DIAGNOSIS — C155 Malignant neoplasm of lower third of esophagus: Secondary | ICD-10-CM

## 2017-05-25 LAB — COMPREHENSIVE METABOLIC PANEL
ALK PHOS: 54 U/L (ref 38–126)
ALT: 12 U/L — ABNORMAL LOW (ref 17–63)
ANION GAP: 7 (ref 5–15)
AST: 18 U/L (ref 15–41)
Albumin: 3.9 g/dL (ref 3.5–5.0)
BUN: 9 mg/dL (ref 6–20)
CALCIUM: 8.5 mg/dL — AB (ref 8.9–10.3)
CO2: 28 mmol/L (ref 22–32)
Chloride: 102 mmol/L (ref 101–111)
Creatinine, Ser: 0.79 mg/dL (ref 0.61–1.24)
GFR calc Af Amer: >60 mL/min (ref >60)
GFR calc non Af Amer: >60 mL/min (ref >60)
Glucose, Bld: 126 mg/dL — ABNORMAL HIGH (ref 65–99)
POTASSIUM: 3.5 mmol/L (ref 3.5–5.1)
SODIUM: 137 mmol/L (ref 135–145)
TOTAL PROTEIN: 7 g/dL (ref 6.5–8.1)
Total Bilirubin: 0.4 mg/dL (ref 0.3–1.2)

## 2017-05-25 LAB — CBC WITH DIFFERENTIAL/PLATELET
BASOS PCT: 1 %
Basophils Absolute: 0 10*3/uL (ref 0.0–0.1)
EOS ABS: 0.1 10*3/uL (ref 0.0–0.7)
Eosinophils Relative: 2 %
HCT: 35.5 % — ABNORMAL LOW (ref 39.0–52.0)
HEMOGLOBIN: 12 g/dL — AB (ref 13.0–17.0)
LYMPHS ABS: 1.9 10*3/uL (ref 0.7–4.0)
LYMPHS PCT: 47 %
MCH: 26.4 pg (ref 26.0–34.0)
MCHC: 33.8 g/dL (ref 30.0–36.0)
MCV: 78 fL (ref 78.0–100.0)
MONO ABS: 0.3 10*3/uL (ref 0.1–1.0)
Monocytes Relative: 7 %
NEUTROS ABS: 1.8 10*3/uL (ref 1.7–7.7)
Neutrophils Relative %: 43 %
Platelets: 213 10*3/uL (ref 150–400)
RBC: 4.55 MIL/uL (ref 4.22–5.81)
RDW: 23.7 % — AB (ref 11.5–15.5)
WBC: 4.1 10*3/uL (ref 4.0–10.5)

## 2017-05-25 MED ORDER — DIPHENHYDRAMINE HCL 50 MG/ML IJ SOLN
50.0000 mg | Freq: Once | INTRAMUSCULAR | Status: AC
  Administered 2017-05-25: 50 mg via INTRAVENOUS

## 2017-05-25 MED ORDER — HEPARIN SOD (PORK) LOCK FLUSH 100 UNIT/ML IV SOLN
500.0000 [IU] | Freq: Once | INTRAVENOUS | Status: AC | PRN
  Administered 2017-05-25: 500 [IU]

## 2017-05-25 MED ORDER — PACLITAXEL CHEMO INJECTION 300 MG/50ML
80.0000 mg/m2 | Freq: Once | INTRAVENOUS | Status: AC
  Administered 2017-05-25: 174 mg via INTRAVENOUS
  Filled 2017-05-25: qty 29

## 2017-05-25 MED ORDER — SODIUM CHLORIDE 0.9% FLUSH
10.0000 mL | INTRAVENOUS | Status: DC | PRN
Start: 2017-05-25 — End: 2017-05-25
  Administered 2017-05-25: 10 mL
  Filled 2017-05-25: qty 10

## 2017-05-25 MED ORDER — SODIUM CHLORIDE 0.9 % IV SOLN
Freq: Once | INTRAVENOUS | Status: AC
  Administered 2017-05-25: 500 mL via INTRAVENOUS

## 2017-05-25 MED ORDER — SODIUM CHLORIDE 0.9 % IV SOLN: 10.0000 mg | Freq: Once | INTRAVENOUS | Status: DC

## 2017-05-25 MED ORDER — FAMOTIDINE IN NACL 20-0.9 MG/50ML-% IV SOLN
20.0000 mg | Freq: Once | INTRAVENOUS | Status: AC
  Administered 2017-05-25: 20 mg via INTRAVENOUS

## 2017-05-25 MED ORDER — DEXAMETHASONE SODIUM PHOSPHATE 10 MG/ML IJ SOLN
10.0000 mg | Freq: Once | INTRAMUSCULAR | Status: AC
  Administered 2017-05-25: 10 mg via INTRAVENOUS

## 2017-05-25 NOTE — Progress Notes (Signed)
To treatment area for labs and Taxol.  Patient denied any symptoms today or pain.    Labs reviewed with [name], NP, and ok to treat today.   Patient tolerated chemotherapy with no complaints voiced today.  Port site clean and dry with no bruising or swelling noted at site.  Band aid applied.  VSS with discharge and left ambulatory.  No s/s of distress noted with discharge.

## 2017-05-25 NOTE — Patient Instructions (Signed)
[facility] Discharge Instructions for Patients Receiving Chemotherapy  Today you received the following chemotherapy agents taxol today.   To help prevent nausea and vomiting after your treatment, we encourage you to take your nausea medication.    If you develop nausea and vomiting that is not controlled by your nausea medication, call the clinic.   BELOW ARE SYMPTOMS THAT SHOULD BE REPORTED IMMEDIATELY:  *FEVER GREATER THAN 100.5 F  *CHILLS WITH OR WITHOUT FEVER  NAUSEA AND VOMITING THAT IS NOT CONTROLLED WITH YOUR NAUSEA MEDICATION  *UNUSUAL SHORTNESS OF BREATH  *UNUSUAL BRUISING OR BLEEDING  TENDERNESS IN MOUTH AND THROAT WITH OR WITHOUT PRESENCE OF ULCERS  *URINARY PROBLEMS  *BOWEL PROBLEMS  UNUSUAL RASH Items with * indicate a potential emergency and should be followed up as soon as possible.  Feel free to call the clinic should you have any questions or concerns. The clinic phone number is [phone].  Please show the [facility] at check-in to the Emergency Department and triage nurse.

## 2017-06-01 ENCOUNTER — Encounter (HOSPITAL_BASED_OUTPATIENT_CLINIC_OR_DEPARTMENT_OTHER): Payer: Self-pay

## 2017-06-01 ENCOUNTER — Encounter (HOSPITAL_COMMUNITY): Payer: Self-pay

## 2017-06-01 VITALS — BP 141/95 | HR 87 | Temp 98.6°F | Resp 16 | Wt 203.8 lb

## 2017-06-01 DIAGNOSIS — Z5112 Encounter for antineoplastic immunotherapy: Secondary | ICD-10-CM

## 2017-06-01 DIAGNOSIS — C155 Malignant neoplasm of lower third of esophagus: Secondary | ICD-10-CM

## 2017-06-01 DIAGNOSIS — C779 Secondary and unspecified malignant neoplasm of lymph node, unspecified: Secondary | ICD-10-CM

## 2017-06-01 DIAGNOSIS — Z5111 Encounter for antineoplastic chemotherapy: Secondary | ICD-10-CM

## 2017-06-01 DIAGNOSIS — C160 Malignant neoplasm of cardia: Secondary | ICD-10-CM

## 2017-06-01 DIAGNOSIS — C159 Malignant neoplasm of esophagus, unspecified: Secondary | ICD-10-CM

## 2017-06-01 LAB — URINALYSIS, DIPSTICK ONLY
BILIRUBIN URINE: NEGATIVE
GLUCOSE, UA: NEGATIVE mg/dL
HGB URINE DIPSTICK: NEGATIVE
KETONES UR: NEGATIVE mg/dL
Leukocytes, UA: NEGATIVE
Nitrite: NEGATIVE
PROTEIN: 30 mg/dL — AB
Specific Gravity, Urine: 1.027 (ref 1.005–1.030)
pH: 5 (ref 5.0–8.0)

## 2017-06-01 LAB — CBC WITH DIFFERENTIAL/PLATELET
BASOS PCT: 0 %
Basophils Absolute: 0 10*3/uL (ref 0.0–0.1)
EOS PCT: 1 %
Eosinophils Absolute: 0.1 10*3/uL (ref 0.0–0.7)
HEMATOCRIT: 35.4 % — AB (ref 39.0–52.0)
Hemoglobin: 12.1 g/dL — ABNORMAL LOW (ref 13.0–17.0)
LYMPHS ABS: 1.5 10*3/uL (ref 0.7–4.0)
Lymphocytes Relative: 29 %
MCH: 26.8 pg (ref 26.0–34.0)
MCHC: 34.2 g/dL (ref 30.0–36.0)
MCV: 78.5 fL (ref 78.0–100.0)
MONOS PCT: 5 %
Monocytes Absolute: 0.3 10*3/uL (ref 0.1–1.0)
Neutro Abs: 3.2 10*3/uL (ref 1.7–7.7)
Neutrophils Relative %: 65 %
Platelets: 224 10*3/uL (ref 150–400)
RBC: 4.51 MIL/uL (ref 4.22–5.81)
RDW: 21.8 % — AB (ref 11.5–15.5)
WBC: 5.1 10*3/uL (ref 4.0–10.5)

## 2017-06-01 LAB — COMPREHENSIVE METABOLIC PANEL
ALT: 14 U/L — AB (ref 17–63)
AST: 21 U/L (ref 15–41)
Albumin: 3.9 g/dL (ref 3.5–5.0)
Alkaline Phosphatase: 51 U/L (ref 38–126)
Anion gap: 9 (ref 5–15)
BILIRUBIN TOTAL: 0.6 mg/dL (ref 0.3–1.2)
BUN: 11 mg/dL (ref 6–20)
CO2: 24 mmol/L (ref 22–32)
CREATININE: 1 mg/dL (ref 0.61–1.24)
Calcium: 8.6 mg/dL — ABNORMAL LOW (ref 8.9–10.3)
Chloride: 103 mmol/L (ref 101–111)
Glucose, Bld: 138 mg/dL — ABNORMAL HIGH (ref 65–99)
POTASSIUM: 3.4 mmol/L — AB (ref 3.5–5.1)
Sodium: 136 mmol/L (ref 135–145)
TOTAL PROTEIN: 6.9 g/dL (ref 6.5–8.1)

## 2017-06-01 MED ORDER — DIPHENHYDRAMINE HCL 50 MG/ML IJ SOLN
INTRAMUSCULAR | Status: AC
  Filled 2017-06-01: qty 1

## 2017-06-01 MED ORDER — POTASSIUM CHLORIDE CRYS ER 20 MEQ PO TBCR
40.0000 meq | EXTENDED_RELEASE_TABLET | Freq: Once | ORAL | Status: AC
  Administered 2017-06-01: 40 meq via ORAL
  Filled 2017-06-01: qty 2

## 2017-06-01 MED ORDER — DEXTROSE 5 % IV SOLN
80.0000 mg/m2 | Freq: Once | INTRAVENOUS | Status: AC
  Administered 2017-06-01: 174 mg via INTRAVENOUS
  Filled 2017-06-01: qty 29

## 2017-06-01 MED ORDER — DEXAMETHASONE SODIUM PHOSPHATE 10 MG/ML IJ SOLN
10.0000 mg | Freq: Once | INTRAMUSCULAR | Status: AC
  Administered 2017-06-01: 10 mg via INTRAVENOUS

## 2017-06-01 MED ORDER — HEPARIN SOD (PORK) LOCK FLUSH 100 UNIT/ML IV SOLN
500.0000 [IU] | Freq: Once | INTRAVENOUS | Status: AC | PRN
  Administered 2017-06-01: 500 [IU]
  Filled 2017-06-01: qty 5

## 2017-06-01 MED ORDER — DIPHENHYDRAMINE HCL 50 MG/ML IJ SOLN
50.0000 mg | Freq: Once | INTRAMUSCULAR | Status: AC
  Administered 2017-06-01: 50 mg via INTRAVENOUS

## 2017-06-01 MED ORDER — SODIUM CHLORIDE 0.9 % IV SOLN
Freq: Once | INTRAVENOUS | Status: AC
  Administered 2017-06-01: 10:00:00 via INTRAVENOUS

## 2017-06-01 MED ORDER — SODIUM CHLORIDE 0.9 % IV SOLN
8.0000 mg/kg | Freq: Once | INTRAVENOUS | Status: AC
  Administered 2017-06-01: 800 mg via INTRAVENOUS
  Filled 2017-06-01: qty 30

## 2017-06-01 MED ORDER — FAMOTIDINE IN NACL 20-0.9 MG/50ML-% IV SOLN
INTRAVENOUS | Status: AC
  Filled 2017-06-01: qty 50

## 2017-06-01 MED ORDER — DEXAMETHASONE SODIUM PHOSPHATE 10 MG/ML IJ SOLN
INTRAMUSCULAR | Status: AC
  Filled 2017-06-01: qty 1

## 2017-06-01 MED ORDER — FAMOTIDINE IN NACL 20-0.9 MG/50ML-% IV SOLN
20.0000 mg | Freq: Once | INTRAVENOUS | Status: AC
  Administered 2017-06-01: 20 mg via INTRAVENOUS

## 2017-06-01 MED ORDER — POTASSIUM CHLORIDE CRYS ER 20 MEQ PO TBCR: 40.0000 meq | EXTENDED_RELEASE_TABLET | Freq: Two times a day (BID) | ORAL | Status: DC

## 2017-06-01 MED ORDER — ACETAMINOPHEN 325 MG PO TABS
650.0000 mg | ORAL_TABLET | Freq: Once | ORAL | Status: AC
  Administered 2017-06-01: 650 mg via ORAL

## 2017-06-01 MED ORDER — SODIUM CHLORIDE 0.9% FLUSH
10.0000 mL | INTRAVENOUS | Status: DC | PRN
  Administered 2017-06-01: 10 mL
  Filled 2017-06-01: qty 10

## 2017-06-01 MED ORDER — SODIUM CHLORIDE 0.9 % IV SOLN: 10.0000 mg | Freq: Once | INTRAVENOUS | Status: DC

## 2017-06-01 MED ORDER — POTASSIUM CHLORIDE 20 MEQ PO PACK
40.0000 meq | PACK | Freq: Once | ORAL | Status: DC
  Filled 2017-06-01: qty 2

## 2017-06-01 MED ORDER — ACETAMINOPHEN 325 MG PO TABS
ORAL_TABLET | ORAL | Status: AC
  Filled 2017-06-01: qty 2

## 2017-06-01 NOTE — Progress Notes (Signed)
Treatment given per orders. Patient tolerated it well without problems. Vitals stable and discharged home from clinic ambulatory. Follow up as scheduled.  

## 2017-06-01 NOTE — Patient Instructions (Signed)
[facility] Discharge Instructions for Patients Receiving Chemotherapy   Beginning January 23rd 2017 lab work for the Cancer Center will be done in the  Main lab at [facility] on 1st floor. If you have a lab appointment with the Cancer Center please come in thru the  Main Entrance and check in at the main information desk   Today you received the following chemotherapy agents   To help prevent nausea and vomiting after your treatment, we encourage you to take your nausea medication     If you develop nausea and vomiting, or diarrhea that is not controlled by your medication, call the clinic.  The clinic phone number is [phone]. Office hours are Monday-Friday 8:30am-5:00pm.  BELOW ARE SYMPTOMS THAT SHOULD BE REPORTED IMMEDIATELY:  *FEVER GREATER THAN 101.0 F  *CHILLS WITH OR WITHOUT FEVER  NAUSEA AND VOMITING THAT IS NOT CONTROLLED WITH YOUR NAUSEA MEDICATION  *UNUSUAL SHORTNESS OF BREATH  *UNUSUAL BRUISING OR BLEEDING  TENDERNESS IN MOUTH AND THROAT WITH OR WITHOUT PRESENCE OF ULCERS  *URINARY PROBLEMS  *BOWEL PROBLEMS  UNUSUAL RASH Items with * indicate a potential emergency and should be followed up as soon as possible. If you have an emergency after office hours please contact your primary care physician or go to the nearest emergency department.  Please call the clinic during office hours if you have any questions or concerns.   You may also contact the Patient Navigator at [phone] should you have any questions or need assistance in obtaining follow up care.      Resources For Cancer Patients and their Caregivers ? American Cancer Society: Can assist with transportation, wigs, general needs, runs Look Good Feel Better.        1-888-227-6333 ? Cancer Care: Provides financial assistance, online support groups, medication/co-pay assistance.  1-800-813-HOPE (4673) ? [facility] Assists [place] cancer  patients and their families through emotional , educational and financial support.  336-427-4357 ? [place] DSS Where to apply for food stamps, Medicaid and utility assistance. 336-342-1394 ? RCATS: Transportation to medical appointments. 336-347-2287 ? Social Security Administration: May apply for disability if have a Stage IV cancer. 336-342-7796 1-800-772-1213 ? [place] Aging, Disability and Transit Services: Assists with nutrition, care and transit needs. 336-349-2343         

## 2017-06-08 ENCOUNTER — Encounter (HOSPITAL_BASED_OUTPATIENT_CLINIC_OR_DEPARTMENT_OTHER): Payer: Self-pay | Admitting: Oncology

## 2017-06-08 ENCOUNTER — Encounter (HOSPITAL_COMMUNITY): Payer: Self-pay | Admitting: Oncology

## 2017-06-08 VITALS — BP 163/101 | HR 68 | Temp 98.2°F | Resp 18 | Wt 206.5 lb

## 2017-06-08 DIAGNOSIS — Z23 Encounter for immunization: Secondary | ICD-10-CM

## 2017-06-08 DIAGNOSIS — C159 Malignant neoplasm of esophagus, unspecified: Secondary | ICD-10-CM

## 2017-06-08 DIAGNOSIS — C155 Malignant neoplasm of lower third of esophagus: Secondary | ICD-10-CM

## 2017-06-08 DIAGNOSIS — Z720 Tobacco use: Secondary | ICD-10-CM

## 2017-06-08 DIAGNOSIS — D509 Iron deficiency anemia, unspecified: Secondary | ICD-10-CM

## 2017-06-08 DIAGNOSIS — C160 Malignant neoplasm of cardia: Secondary | ICD-10-CM

## 2017-06-08 DIAGNOSIS — M109 Gout, unspecified: Secondary | ICD-10-CM

## 2017-06-08 DIAGNOSIS — R5383 Other fatigue: Secondary | ICD-10-CM

## 2017-06-08 DIAGNOSIS — C778 Secondary and unspecified malignant neoplasm of lymph nodes of multiple regions: Secondary | ICD-10-CM

## 2017-06-08 DIAGNOSIS — D500 Iron deficiency anemia secondary to blood loss (chronic): Secondary | ICD-10-CM

## 2017-06-08 MED ORDER — INFLUENZA VAC SPLIT QUAD 0.5 ML IM SUSY
PREFILLED_SYRINGE | INTRAMUSCULAR | Status: AC
  Filled 2017-06-08: qty 0.5

## 2017-06-08 MED ORDER — INFLUENZA VAC SPLIT QUAD 0.5 ML IM SUSY
0.5000 mL | PREFILLED_SYRINGE | Freq: Once | INTRAMUSCULAR | Status: AC
  Administered 2017-06-08: 0.5 mL via INTRAMUSCULAR

## 2017-06-08 NOTE — Progress Notes (Signed)
Maxwell Henderson presents today for injection per MD orders. Fluarix administered IM in left deltoid. Administration without incident. Patient tolerated well. Patient tolerated treatment without incidence. Patient discharged ambulatory and in stable condition from clinic. Patient to follow up as scheduled.

## 2017-06-08 NOTE — Patient Instructions (Addendum)
[place] at [facility] Discharge Instructions  RECOMMENDATIONS MADE BY THE CONSULTANT AND ANY TEST RESULTS WILL BE SENT TO YOUR REFERRING PHYSICIAN.  You were seen today by Dr. Forest Gleason You had your flu shot today Follow up with next treatment   Thank you for choosing [place] at [facility] to provide your oncology and hematology care.  To afford each patient quality time with our provider, please arrive at least 15 minutes before your scheduled appointment time.    If you have a lab appointment with the [facility] please come in thru the  Main Entrance and check in at the main information desk  You need to re-schedule your appointment should you arrive 10 or more minutes late.  We strive to give you quality time with our providers, and arriving late affects you and other patients whose appointments are after yours.  Also, if you no show three or more times for appointments you may be dismissed from the clinic at the providers discretion.     Again, thank you for choosing [facility].  Our hope is that these requests will decrease the amount of time that you wait before being seen by our physicians.       _____________________________________________________________  Should you have questions after your visit to [facility], please contact our office at [phone] between the hours of 8:30 a.m. and 4:30 p.m.  Voicemails left after 4:30 p.m. will not be returned until the following business day.  For prescription refill requests, have your pharmacy contact our office.       Resources For Cancer Patients and their Caregivers ? American Cancer Society: Can assist with transportation, wigs, general needs, runs Look Good Feel Better.        867 230 0386 ? Cancer Care: Provides financial assistance, online support groups, medication/co-pay assistance.  1-800-813-HOPE (360)121-6921) ? [facility] Assists [place] cancer patients and their families through emotional , educational and financial support.  (702)210-5535 ? [place] DSS Where to apply for food stamps, Medicaid and utility assistance. 914-275-4566 ? RCATS: Transportation to medical appointments. (704)432-8685 ? Social Security Administration: May apply for disability if have a Stage IV cancer. 757 878 1242 816-599-9479 ? [employer], Disability and Transit Services: Assists with nutrition, care and transit needs. [place] Support Programs: @10RELATIVEDAYS @ > Cancer Support Group  2nd Tuesday of the month 1pm-2pm, Journey Room  > Creative Journey  3rd Tuesday of the month 1130am-1pm, Journey Room  > Look Good Feel Better  1st Wednesday of the month 10am-12 noon, Journey Room (Call [place] to register [phone])

## 2017-06-08 NOTE — Progress Notes (Signed)
 [facility] [address] 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 Sandi Fields. Partially obstructing, likely malignant esophageal tumor was found in the distal esophagus. Biopsied with surgical path demonstrating Adenocarcinoma. Injected. Treated with argon plasma coagulation (APC) #3. 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 Biopsied with surgical path demonstrating Adenocarcinoma.      01/19/2017 PET scan    NECK Two FDG avid nodes are seen in the base of the neck on the right. The first is seen on CT image 35, just posterior to the right thyroid lobe and the other is seen on CT image 41 measuring 16 mm in short axis with a maximum SUV of 8.6.  CHEST  The patient's known malignancy involving the distal esophagus and cardia of the stomach is FDG avid with a maximum SUV of 10.4. No definitive metastatic nodes within the chest.  ABDOMEN/PELVIS  FDG avid metastatic adenopathy is seen in the gastrohepatic ligament, paraceliac nodes, posterior to the IVC on image 111, and to the left of the SMA on image 112. The most inferior node is seen in the aortocaval region on image 117. The representative node to the left of the SMA was measured on series 4, image 111 measuring 2.5 by 2.1 cm with a maximum SUV of 7. No other FDG avid disease in the abdomen or pelvis. Cholelithiasis is identified.      02/02/2017 Procedure    US guided bx of right supraclavicular LN by IR.      02/05/2017 Pathology Results     Lymph node, needle/core biopsy, Right Supraclavicular - METASTATIC POORLY DIFFERENTIATED CARCINOMA.      02/10/2017 Pathology Results    HER2 - NEGATIVE      02/13/2017 Procedure    Port placed by IR      02/15/2017 - 04/25/2017 Chemotherapy    FOLFOX x 6 cycles       05/02/2017 Imaging    CT C/A/P: IMPRESSION: 1. Nodular thickening and hyperenhancement in the distal esophagus and gastric cardia could reflect residual tumor. 2. No significant change in size of previously demonstrated hypermetabolic upper abdominal lymph nodes. No progressive adenopathy identified. No residual enlarged supraclavicular nodes are seen. 3. No evidence of progressive metastatic disease. No worrisome hepatic findings. 4. Tiny right upper lobe pulmonary nodule, not clearly seen on low dose previous PET-CT. Attention on follow-up recommended. 5. Cholelithiasis.        INTERVAL HISTORY:  Maxwell Henderson 51 y.o. male returns for routine follow-up for esophageal cancer with his wife to review his restaging scans. Patient states that he recently is having pain and swelling in his right foot. He thinks it is a gout flare. He states that he has been eating a lot more red meat recently. Patient has been tolerating chemotherapy well with minimal side effects. He states that he can eat both solids and liquids although occasionally he states he feels like food still gets stuck. He denies any chest pain, shortness breath, abdominal pain, focal weakness. He continues to work.   He does have some fatigue.  He denies any neuropathy. June 08, 2017 Patient is here to discuss the results of the CT scan and continuation of therapy for stage IV carcinoma of esophagus At some headache this morning and blood pressure was slightly elevated but patient did not take his blood pressure medication. Occasional indigestion plus patient's swallowing is improved. CT scan has been reviewed independently and so stable disease CONTINUES  to smoke REVIEW OF SYSTEMS:  Review of Systems  Constitutional: Positive for fatigue. Negative for chills and fever.  HENT:  Negative.  Negative for lump/mass and nosebleeds.   Eyes: Negative.   Respiratory: Negative.  Negative for cough and shortness of breath.   Cardiovascular: Negative.  Negative for chest pain and leg swelling.  Gastrointestinal: Negative.  Negative for abdominal pain, blood in stool, constipation, diarrhea, nausea and vomiting.  Endocrine: Negative.   Genitourinary: Negative.  Negative for dysuria and hematuria.   Musculoskeletal: Negative.  Negative for arthralgias.  Skin: Negative.  Negative for rash.  Neurological: Negative.  Negative for dizziness and headaches.  Hematological: Negative.  Negative for adenopathy. Does not bruise/bleed easily.  Psychiatric/Behavioral: Negative.  Negative for depression and sleep disturbance. The patient is not nervous/anxious.      PAST MEDICAL/SURGICAL HISTORY:  Past Medical History:  Diagnosis Date  . Diabetes mellitus without complication ([place])   . Hypertension   . 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 Primary esophageal adenocarcinoma ([facility]) 01/29/2017   Past Surgical History:  Procedure Laterality Date  . ESOPHAGOGASTRODUODENOSCOPY (EGD) WITH PROPOFOL N/A 01/16/2017   Procedure: ESOPHAGOGASTRODUODENOSCOPY (EGD) WITH PROPOFOL;  Surgeon: [name], MD;  Location: [facility];  Service: Endoscopy;  Laterality: N/A;  730   . IR FLUORO GUIDE PORT INSERTION RIGHT  02/13/2017  . IR US GUIDE VASC ACCESS RIGHT  02/13/2017  . lipoma removal    . PORTA CATH INSERTION Right 02/13/2017  . SAVORY DILATION N/A 01/16/2017   Procedure: SAVORY DILATION;  Surgeon: [name], MD;  Location: [facility];  Service: Endoscopy;  Laterality: N/A;     SOCIAL HISTORY:  Social History   Social History  . Marital status: Married    Spouse name: N/A  . Number of children: N/A  . Years of education: N/A    Occupational History  . Not on file.   Social History Main Topics  . Smoking status: Current Some Day Smoker    Packs/day: 0.25    Types: Cigarettes  . Smokeless tobacco: Never Used  . Alcohol use No     Comment: hx heavy alcohol, hasn't drank in 19 yrs  . Drug use: Yes    Types: Marijuana     Comment: history of marijuana   . Sexual activity: Yes   Other Topics Concern  . Not on file   Social History Narrative  . No narrative on file    FAMILY HISTORY:  Family History  Problem Relation Age of Onset  . Prostate cancer Father   . Colon cancer Neg Hx   . Colon polyps Neg Hx     CURRENT MEDICATIONS:  Outpatient Encounter Prescriptions as of 06/08/2017  Medication Sig  . colchicine 0.6 MG tablet Take 1 tablet (0.6 mg total) by mouth daily.  Marland Kitchen dexamethasone (DECADRON) 4 MG tablet Take 2 tablets (8 mg total) by mouth daily. Start the day after chemotherapy for 2 days. Take with food.  . Diphenhyd-Hydrocort-Nystatin (FIRST-DUKES MOUTHWASH) SUSP Use as directed 5 mLs  in the mouth or throat 4 (four) times daily as needed.  . loratadine (CLARITIN) 10 MG tablet Take 10 mg by mouth daily.  Marland Kitchen losartan (COZAAR) 25 MG tablet Take 25 mg by mouth daily.  . metFORMIN (GLUCOPHAGE) 500 MG tablet Take 500 mg by mouth daily.  . Morphine Sulfate (MORPHINE CONCENTRATE) 10 mg / 0.5 ml concentrated solution Take 0.5 mLs (10 mg total) by mouth every 4 (four) hours as needed for severe pain.  Marland Kitchen PACLitaxel (TAXOL IV) Inject into the vein.  . pantoprazole (PROTONIX) 40 MG tablet Take 1 tablet (40 mg total) by mouth 2 (two) times daily. Take 30 minutes before breakfast  . polyethylene glycol powder (GLYCOLAX/MIRALAX) powder Take 1 capful daily.  . prochlorperazine (COMPAZINE) 10 MG tablet Take 1 tablet (10 mg total) by mouth every 6 (six) hours as needed (Nausea or vomiting).  . Ramucirumab (CYRAMZA IV) Inject into the vein.  Marland Kitchen temazepam (RESTORIL) 30 MG capsule Take 1 capsule (30 mg total) by  mouth at bedtime as needed for sleep.   No facility-administered encounter medications on file as of 06/08/2017.     ALLERGIES:  No Known Allergies   PHYSICAL EXAM:  ECOG Performance status: 1 - Symptomatic; remains independent      Physical Exam  Constitutional: He is oriented to person, place, and time and well-developed, well-nourished, and in no distress.  HENT:  Head: Normocephalic.  Mouth/Throat: Oropharynx is clear and moist.  Eyes: Conjunctivae are normal. No scleral icterus.  Neck: Normal range of motion. Neck supple.  Cardiovascular: Normal rate and regular rhythm.   Pulmonary/Chest: Effort normal and breath sounds normal. No respiratory distress.  Abdominal: Soft. Bowel sounds are normal. There is no tenderness.  Musculoskeletal: Normal range of motion. He exhibits no edema.  Lymphadenopathy:    He has no cervical adenopathy.       Right: No supraclavicular adenopathy present.       Left: No supraclavicular adenopathy present.  Neurological: He is alert and oriented to person, place, and time. No cranial nerve deficit.  Skin: Skin is warm and dry. No rash noted.  Psychiatric: Mood, memory, affect and judgment normal.  Nursing note and vitals reviewed.    LABORATORY DATA:  I have reviewed the labs as listed.  CBC    Component Value Date/Time   WBC 5.1 06/01/2017 0907   RBC 4.51 06/01/2017 0907   HGB 12.1 (L) 06/01/2017 0907   HCT 35.4 (L) 06/01/2017 0907   PLT 224 06/01/2017 0907   MCV 78.5 06/01/2017 0907   MCH 26.8 06/01/2017 0907   MCHC 34.2 06/01/2017 0907   RDW 21.8 (H) 06/01/2017 0907   LYMPHSABS 1.5 06/01/2017 0907   MONOABS 0.3 06/01/2017 0907   EOSABS 0.1 06/01/2017 0907   BASOSABS 0.0 06/01/2017 0907   CMP Latest Ref Rng & Units 06/01/2017 05/25/2017 05/18/2017  Glucose 65 - 99 mg/dL 138(H) 126(H) 121(H)  BUN 6 - 20 mg/dL '11 9 9  '$ Creatinine 0.61 - 1.24 mg/dL 1.00 0.79 0.84  Sodium 135 - 145 mmol/L 136 137 137  Potassium 3.5 - 5.1 mmol/L  3.4(L) 3.5 3.4(L)  Chloride 101 - 111 mmol/L 103 102 102  CO2 22 - 32 mmol/L '24 28 26  '$ Calcium 8.9 - 10.3 mg/dL 8.6(L) 8.5(L) 9.2  Total Protein 6.5 - 8.1 g/dL 6.9 7.0 7.2  Total Bilirubin 0.3 - 1.2 mg/dL 0.6 0.4 0.7  Alkaline Phos 38 - 126 U/L 51 54 56  AST 15 - 41 U/L 21 18  20  ALT 17 - 63 U/L 14(L) 12(L) 14(L)    PENDING LABS:    DIAGNOSTIC IMAGING:  *The following radiologic images and reports have been reviewed independently and agree with below findings.   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 Oneida Alar.  PET scan 01/19/17 revealed primary malignancy involving distal esophagus and gastric cardia, with nodes at base of right neck most consistent with metastatic disease; also metastatic nodes in upper abdomen. Underwent (R) supraclavicular lymph node biopsy on 01/16/17 revealing metastatic poorly differentiated carcinoma; HER2-.  He did not undergo EUS prior to treatment.  Started systemic treatment with FOLFOX chemotherapy on 02/15/17.  -Patient has completed 6 cycles of FOLFOX to date. His repeat CT C/A/P demonstrated partial response with resolution of his right supraclavicular lymphadenopathy, however he still has residual thickening/hyperenhancement in the distal esophagus/gastric cardia and no change in his abdominal lymphadenopathy. A CT scan shows stable disease will continue present therapy with CYRAMZA and Taxol. The patient's next treatment has been planned on June 15, 2017 Reevaluation in 4 weeks and another CT or a PET scan after 3 more cycles of chemotherapy  Iron deficiency anemia:  -Likely from esophageal malignancy/ulceration.  -s/p 2 doses of Feraheme on 03/02/17 & 03/09/17  for low ferritin of 9 . -Received 1 dose of feraheme on 04/25/17. Last hemoglobin 11.7 g/dL on 04/25/17.  Gout Flare: -Use naproxen. -Prescribed colchicine as well to help with his gout flare. -avoid too much red meats.  Dispo:  -RTC in 4 weeks for follow up.   Maxwell Henderson. MD

## 2017-06-11 ENCOUNTER — Other Ambulatory Visit (HOSPITAL_COMMUNITY): Payer: Self-pay | Admitting: Adult Health

## 2017-06-11 DIAGNOSIS — C159 Malignant neoplasm of esophagus, unspecified: Secondary | ICD-10-CM

## 2017-06-15 ENCOUNTER — Encounter (HOSPITAL_COMMUNITY): Payer: Self-pay | Attending: Oncology

## 2017-06-15 ENCOUNTER — Encounter (HOSPITAL_COMMUNITY): Payer: Self-pay

## 2017-06-15 VITALS — BP 151/92 | HR 69 | Temp 97.8°F | Resp 16 | Wt 208.8 lb

## 2017-06-15 DIAGNOSIS — E876 Hypokalemia: Secondary | ICD-10-CM | POA: Insufficient documentation

## 2017-06-15 DIAGNOSIS — C160 Malignant neoplasm of cardia: Secondary | ICD-10-CM

## 2017-06-15 DIAGNOSIS — C155 Malignant neoplasm of lower third of esophagus: Secondary | ICD-10-CM

## 2017-06-15 DIAGNOSIS — C159 Malignant neoplasm of esophagus, unspecified: Secondary | ICD-10-CM | POA: Insufficient documentation

## 2017-06-15 DIAGNOSIS — G4701 Insomnia due to medical condition: Secondary | ICD-10-CM | POA: Insufficient documentation

## 2017-06-15 DIAGNOSIS — Z5112 Encounter for antineoplastic immunotherapy: Secondary | ICD-10-CM

## 2017-06-15 DIAGNOSIS — C778 Secondary and unspecified malignant neoplasm of lymph nodes of multiple regions: Secondary | ICD-10-CM

## 2017-06-15 DIAGNOSIS — D509 Iron deficiency anemia, unspecified: Secondary | ICD-10-CM | POA: Insufficient documentation

## 2017-06-15 DIAGNOSIS — Z5111 Encounter for antineoplastic chemotherapy: Secondary | ICD-10-CM

## 2017-06-15 LAB — COMPREHENSIVE METABOLIC PANEL
ALT: 14 U/L — ABNORMAL LOW (ref 17–63)
AST: 16 U/L (ref 15–41)
Albumin: 4.2 g/dL (ref 3.5–5.0)
Alkaline Phosphatase: 56 U/L (ref 38–126)
Anion gap: 10 (ref 5–15)
BUN: 7 mg/dL (ref 6–20)
CHLORIDE: 104 mmol/L (ref 101–111)
CO2: 25 mmol/L (ref 22–32)
Calcium: 8.9 mg/dL (ref 8.9–10.3)
Creatinine, Ser: 0.86 mg/dL (ref 0.61–1.24)
GFR calc Af Amer: >60 mL/min (ref >60)
Glucose, Bld: 91 mg/dL (ref 65–99)
POTASSIUM: 3.5 mmol/L (ref 3.5–5.1)
SODIUM: 139 mmol/L (ref 135–145)
Total Bilirubin: 0.7 mg/dL (ref 0.3–1.2)
Total Protein: 6.9 g/dL (ref 6.5–8.1)

## 2017-06-15 LAB — CBC WITH DIFFERENTIAL/PLATELET
Basophils Absolute: 0 10*3/uL (ref 0.0–0.1)
Basophils Relative: 1 %
EOS PCT: 1 %
Eosinophils Absolute: 0.1 10*3/uL (ref 0.0–0.7)
HCT: 34.5 % — ABNORMAL LOW (ref 39.0–52.0)
Hemoglobin: 11.5 g/dL — ABNORMAL LOW (ref 13.0–17.0)
LYMPHS ABS: 1.4 10*3/uL (ref 0.7–4.0)
LYMPHS PCT: 31 %
MCH: 26.7 pg (ref 26.0–34.0)
MCHC: 33.3 g/dL (ref 30.0–36.0)
MCV: 80 fL (ref 78.0–100.0)
Monocytes Absolute: 0.3 10*3/uL (ref 0.1–1.0)
Monocytes Relative: 8 %
NEUTROS PCT: 59 %
Neutro Abs: 2.5 10*3/uL (ref 1.7–7.7)
Platelets: 255 10*3/uL (ref 150–400)
RBC: 4.31 MIL/uL (ref 4.22–5.81)
RDW: 18.1 % — ABNORMAL HIGH (ref 11.5–15.5)
WBC: 4.3 10*3/uL (ref 4.0–10.5)

## 2017-06-15 LAB — URINALYSIS, DIPSTICK ONLY
GLUCOSE, UA: NEGATIVE mg/dL
Hgb urine dipstick: NEGATIVE
KETONES UR: NEGATIVE mg/dL
LEUKOCYTES UA: NEGATIVE
NITRITE: NEGATIVE
PROTEIN: 30 mg/dL — AB
Specific Gravity, Urine: 1.028 (ref 1.005–1.030)
pH: 5 (ref 5.0–8.0)

## 2017-06-15 MED ORDER — ACETAMINOPHEN 325 MG PO TABS
650.0000 mg | ORAL_TABLET | Freq: Once | ORAL | Status: AC
Start: 2017-06-15 — End: 2017-06-15
  Administered 2017-06-15: 650 mg via ORAL

## 2017-06-15 MED ORDER — DEXAMETHASONE SODIUM PHOSPHATE 10 MG/ML IJ SOLN
10.0000 mg | Freq: Once | INTRAMUSCULAR | Status: AC
  Administered 2017-06-15: 10 mg via INTRAVENOUS

## 2017-06-15 MED ORDER — DIPHENHYDRAMINE HCL 50 MG/ML IJ SOLN
INTRAMUSCULAR | Status: AC
  Filled 2017-06-15: qty 1

## 2017-06-15 MED ORDER — RAMUCIRUMAB CHEMO INJECTION 500 MG/50ML
8.0000 mg/kg | Freq: Once | INTRAVENOUS | Status: AC
  Administered 2017-06-15: 800 mg via INTRAVENOUS
  Filled 2017-06-15: qty 30

## 2017-06-15 MED ORDER — FAMOTIDINE IN NACL 20-0.9 MG/50ML-% IV SOLN
20.0000 mg | Freq: Once | INTRAVENOUS | Status: AC
  Administered 2017-06-15: 20 mg via INTRAVENOUS

## 2017-06-15 MED ORDER — DIPHENHYDRAMINE HCL 50 MG/ML IJ SOLN
50.0000 mg | Freq: Once | INTRAMUSCULAR | Status: AC
  Administered 2017-06-15: 50 mg via INTRAVENOUS

## 2017-06-15 MED ORDER — SODIUM CHLORIDE 0.9 % IV SOLN: 10.0000 mg | Freq: Once | INTRAVENOUS | Status: DC

## 2017-06-15 MED ORDER — SODIUM CHLORIDE 0.9% FLUSH
10.0000 mL | INTRAVENOUS | Status: DC | PRN
  Administered 2017-06-15: 10 mL
  Filled 2017-06-15: qty 10

## 2017-06-15 MED ORDER — RANITIDINE HCL 300 MG PO TABS: 300.0000 mg | ORAL_TABLET | Freq: Every day | ORAL | 1 refills | Status: DC

## 2017-06-15 MED ORDER — PACLITAXEL CHEMO INJECTION 300 MG/50ML
80.0000 mg/m2 | Freq: Once | INTRAVENOUS | Status: AC
  Administered 2017-06-15: 174 mg via INTRAVENOUS
  Filled 2017-06-15: qty 29

## 2017-06-15 MED ORDER — ACETAMINOPHEN 325 MG PO TABS
ORAL_TABLET | ORAL | Status: AC
  Filled 2017-06-15: qty 2

## 2017-06-15 MED ORDER — FAMOTIDINE IN NACL 20-0.9 MG/50ML-% IV SOLN
INTRAVENOUS | Status: AC
  Filled 2017-06-15: qty 50

## 2017-06-15 MED ORDER — SODIUM CHLORIDE 0.9 % IV SOLN
Freq: Once | INTRAVENOUS | Status: AC
  Administered 2017-06-15: 10:00:00 via INTRAVENOUS

## 2017-06-15 MED ORDER — HEPARIN SOD (PORK) LOCK FLUSH 100 UNIT/ML IV SOLN
500.0000 [IU] | Freq: Once | INTRAVENOUS | Status: AC | PRN
  Administered 2017-06-15: 500 [IU]
  Filled 2017-06-15: qty 5

## 2017-06-15 MED ORDER — DEXAMETHASONE SODIUM PHOSPHATE 10 MG/ML IJ SOLN
INTRAMUSCULAR | Status: AC
  Filled 2017-06-15: qty 1

## 2017-06-15 NOTE — Patient Instructions (Signed)
[facility] Discharge Instructions for Patients Receiving Chemotherapy   Beginning January 23rd 2017 lab work for the Cancer Center will be done in the  Main lab at [facility] on 1st floor. If you have a lab appointment with the Cancer Center please come in thru the  Main Entrance and check in at the main information desk   Today you received the following chemotherapy agents   To help prevent nausea and vomiting after your treatment, we encourage you to take your nausea medication     If you develop nausea and vomiting, or diarrhea that is not controlled by your medication, call the clinic.  The clinic phone number is [phone]. Office hours are Monday-Friday 8:30am-5:00pm.  BELOW ARE SYMPTOMS THAT SHOULD BE REPORTED IMMEDIATELY:  *FEVER GREATER THAN 101.0 F  *CHILLS WITH OR WITHOUT FEVER  NAUSEA AND VOMITING THAT IS NOT CONTROLLED WITH YOUR NAUSEA MEDICATION  *UNUSUAL SHORTNESS OF BREATH  *UNUSUAL BRUISING OR BLEEDING  TENDERNESS IN MOUTH AND THROAT WITH OR WITHOUT PRESENCE OF ULCERS  *URINARY PROBLEMS  *BOWEL PROBLEMS  UNUSUAL RASH Items with * indicate a potential emergency and should be followed up as soon as possible. If you have an emergency after office hours please contact your primary care physician or go to the nearest emergency department.  Please call the clinic during office hours if you have any questions or concerns.   You may also contact the Patient Navigator at [phone] should you have any questions or need assistance in obtaining follow up care.      Resources For Cancer Patients and their Caregivers ? American Cancer Society: Can assist with transportation, wigs, general needs, runs Look Good Feel Better.        1-888-227-6333 ? Cancer Care: Provides financial assistance, online support groups, medication/co-pay assistance.  1-800-813-HOPE (4673) ? [facility] Assists [place] cancer  patients and their families through emotional , educational and financial support.  336-427-4357 ? [place] DSS Where to apply for food stamps, Medicaid and utility assistance. 336-342-1394 ? RC[employer]: Transportation to medical appointments. 336-347-2287 ? Social Security Administration: May apply for disability if have a Stage IV cancer. 336-342-7796 1-800-772-1213 ? [place] Aging, Disability and Transit Services: Assists with nutrition, care and transit needs. [phone]         

## 2017-06-15 NOTE — Progress Notes (Signed)
Labs reviewed today with MD prior to administering chemotherapy. Patient's BP has been elevated today and at prior visits. MD made aware and instructed to tel patient to increase losartan to 50mg  a day. Also encouraged patient to take his protonix twice a day as prescribed to see if this helps his heartburn, if not then Dr.Zhou has added Zantac if needed.   Treatment given per orders. Patient tolerated it well without problems. Vitals stable and discharged home from clinic ambulatory. Follow up as scheduled.

## 2017-06-22 ENCOUNTER — Encounter (HOSPITAL_BASED_OUTPATIENT_CLINIC_OR_DEPARTMENT_OTHER): Payer: Self-pay

## 2017-06-22 ENCOUNTER — Encounter (HOSPITAL_COMMUNITY): Payer: Self-pay

## 2017-06-22 VITALS — BP 150/100 | HR 84 | Temp 98.2°F | Resp 18 | Wt 208.6 lb

## 2017-06-22 DIAGNOSIS — C160 Malignant neoplasm of cardia: Secondary | ICD-10-CM

## 2017-06-22 DIAGNOSIS — C155 Malignant neoplasm of lower third of esophagus: Secondary | ICD-10-CM

## 2017-06-22 DIAGNOSIS — C778 Secondary and unspecified malignant neoplasm of lymph nodes of multiple regions: Secondary | ICD-10-CM

## 2017-06-22 DIAGNOSIS — Z5111 Encounter for antineoplastic chemotherapy: Secondary | ICD-10-CM

## 2017-06-22 DIAGNOSIS — C159 Malignant neoplasm of esophagus, unspecified: Secondary | ICD-10-CM

## 2017-06-22 LAB — CBC WITH DIFFERENTIAL/PLATELET
BASOS PCT: 1 %
Basophils Absolute: 0 10*3/uL (ref 0.0–0.1)
EOS ABS: 0.2 10*3/uL (ref 0.0–0.7)
Eosinophils Relative: 3 %
HCT: 36 % — ABNORMAL LOW (ref 39.0–52.0)
HEMOGLOBIN: 12.4 g/dL — AB (ref 13.0–17.0)
LYMPHS ABS: 2.6 10*3/uL (ref 0.7–4.0)
Lymphocytes Relative: 45 %
MCH: 27.9 pg (ref 26.0–34.0)
MCHC: 34.4 g/dL (ref 30.0–36.0)
MCV: 80.9 fL (ref 78.0–100.0)
Monocytes Absolute: 0.2 10*3/uL (ref 0.1–1.0)
Monocytes Relative: 3 %
NEUTROS PCT: 48 %
Neutro Abs: 2.7 10*3/uL (ref 1.7–7.7)
Platelets: 259 10*3/uL (ref 150–400)
RBC: 4.45 MIL/uL (ref 4.22–5.81)
RDW: 16.7 % — ABNORMAL HIGH (ref 11.5–15.5)
WBC: 5.6 10*3/uL (ref 4.0–10.5)

## 2017-06-22 LAB — COMPREHENSIVE METABOLIC PANEL
ALT: 31 U/L (ref 17–63)
AST: 27 U/L (ref 15–41)
Albumin: 4.2 g/dL (ref 3.5–5.0)
Alkaline Phosphatase: 90 U/L (ref 38–126)
Anion gap: 8 (ref 5–15)
BUN: 10 mg/dL (ref 6–20)
CALCIUM: 9.1 mg/dL (ref 8.9–10.3)
CHLORIDE: 103 mmol/L (ref 101–111)
CO2: 26 mmol/L (ref 22–32)
CREATININE: 0.83 mg/dL (ref 0.61–1.24)
Glucose, Bld: 90 mg/dL (ref 65–99)
Potassium: 3.6 mmol/L (ref 3.5–5.1)
Sodium: 137 mmol/L (ref 135–145)
Total Bilirubin: 0.6 mg/dL (ref 0.3–1.2)
Total Protein: 7.3 g/dL (ref 6.5–8.1)

## 2017-06-22 MED ORDER — DIPHENHYDRAMINE HCL 50 MG/ML IJ SOLN
50.0000 mg | Freq: Once | INTRAMUSCULAR | Status: AC
  Administered 2017-06-22: 50 mg via INTRAVENOUS
  Filled 2017-06-22: qty 1

## 2017-06-22 MED ORDER — SODIUM CHLORIDE 0.9 % IV SOLN
Freq: Once | INTRAVENOUS | Status: AC
  Administered 2017-06-22: 10:00:00 via INTRAVENOUS

## 2017-06-22 MED ORDER — PACLITAXEL CHEMO INJECTION 300 MG/50ML
80.0000 mg/m2 | Freq: Once | INTRAVENOUS | Status: AC
  Administered 2017-06-22: 174 mg via INTRAVENOUS
  Filled 2017-06-22: qty 29

## 2017-06-22 MED ORDER — SODIUM CHLORIDE 0.9% FLUSH
10.0000 mL | INTRAVENOUS | Status: DC | PRN
  Administered 2017-06-22: 10 mL
  Filled 2017-06-22: qty 10

## 2017-06-22 MED ORDER — HEPARIN SOD (PORK) LOCK FLUSH 100 UNIT/ML IV SOLN
500.0000 [IU] | Freq: Once | INTRAVENOUS | Status: AC | PRN
Start: 2017-06-22 — End: 2017-06-22
  Administered 2017-06-22: 500 [IU]
  Filled 2017-06-22: qty 5

## 2017-06-22 MED ORDER — SODIUM CHLORIDE 0.9 % IV SOLN: 10.0000 mg | Freq: Once | INTRAVENOUS | Status: DC

## 2017-06-22 MED ORDER — FAMOTIDINE IN NACL 20-0.9 MG/50ML-% IV SOLN
20.0000 mg | Freq: Once | INTRAVENOUS | Status: AC
  Administered 2017-06-22: 20 mg via INTRAVENOUS
  Filled 2017-06-22: qty 50

## 2017-06-22 MED ORDER — DEXAMETHASONE SODIUM PHOSPHATE 10 MG/ML IJ SOLN
10.0000 mg | Freq: Once | INTRAMUSCULAR | Status: AC
  Administered 2017-06-22: 10 mg via INTRAVENOUS
  Filled 2017-06-22: qty 1

## 2017-06-22 NOTE — Progress Notes (Signed)
Maxwell Henderson tolerated Taxol infusion well without complaints or incident. Labs reviewed with Dr. Oliva Bustard prior to administering this medication. VSS upon discharge. Pt discharged self ambulatory in satisfactory condition accompanied by his wife

## 2017-06-22 NOTE — Progress Notes (Signed)
Patient reportedly complaining of persistent cough since he received his flu shot recently. Denies any fever/chills or other concerning symptoms of infection.  He has tried OTC anti-tussives without relief; he is requesting prescription cough syrup.   Paper Rx for [facility] high BP cough syrup given to patient today.  (can only be filled at [facility] here in [place]).  Reminded him not to take this medication with any alcohol or other sedating medications.    [name], NP [place] [phone]

## 2017-06-22 NOTE — Patient Instructions (Signed)
[facility] Discharge Instructions for Patients Receiving Chemotherapy   Beginning January 23rd 2017 lab work for the Cancer Center will be done in the  Main lab at [facility] on 1st floor. If you have a lab appointment with the Cancer Center please come in thru the  Main Entrance and check in at the main information desk   Today you received the following chemotherapy agents Taxol. Follow-up as scheduled. Call clinic for any questions or concerns  To help prevent nausea and vomiting after your treatment, we encourage you to take your nausea medication   If you develop nausea and vomiting, or diarrhea that is not controlled by your medication, call the clinic.  The clinic phone number is [phone]. Office hours are Monday-Friday 8:30am-5:00pm.  BELOW ARE SYMPTOMS THAT SHOULD BE REPORTED IMMEDIATELY:  *FEVER GREATER THAN 101.0 F  *CHILLS WITH OR WITHOUT FEVER  NAUSEA AND VOMITING THAT IS NOT CONTROLLED WITH YOUR NAUSEA MEDICATION  *UNUSUAL SHORTNESS OF BREATH  *UNUSUAL BRUISING OR BLEEDING  TENDERNESS IN MOUTH AND THROAT WITH OR WITHOUT PRESENCE OF ULCERS  *URINARY PROBLEMS  *BOWEL PROBLEMS  UNUSUAL RASH Items with * indicate a potential emergency and should be followed up as soon as possible. If you have an emergency after office hours please contact your primary care physician or go to the nearest emergency department.  Please call the clinic during office hours if you have any questions or concerns.   You may also contact the Patient Navigator at [phone] should you have any questions or need assistance in obtaining follow up care.      Resources For Cancer Patients and their Caregivers ? American Cancer Society: Can assist with transportation, wigs, general needs, runs Look Good Feel Better.        1-888-227-6333 ? Cancer Care: Provides financial assistance, online support groups, medication/co-pay assistance.  1-800-813-HOPE  (4673) ? [facility] Assists [place] cancer patients and their families through emotional , educational and financial support.  336-427-4357 ? [place] DSS Where to apply for food stamps, Medicaid and utility assistance. 336-342-1394 ? [facility]: Transportation to medical appointments. 336-347-2287 ? Social Security Administration: May apply for disability if have a Stage IV cancer. 336-342-7796 1-800-772-1213 ? [place] Aging, Disability and Transit Services: Assists with nutrition, care and transit needs. 336-349-2343         

## 2017-06-29 ENCOUNTER — Encounter (HOSPITAL_BASED_OUTPATIENT_CLINIC_OR_DEPARTMENT_OTHER): Payer: Self-pay

## 2017-06-29 ENCOUNTER — Encounter (HOSPITAL_BASED_OUTPATIENT_CLINIC_OR_DEPARTMENT_OTHER): Payer: Self-pay | Admitting: Oncology

## 2017-06-29 ENCOUNTER — Other Ambulatory Visit: Payer: Self-pay

## 2017-06-29 ENCOUNTER — Encounter (HOSPITAL_COMMUNITY): Payer: Self-pay

## 2017-06-29 VITALS — BP 175/95 | HR 102 | Temp 98.2°F | Resp 16

## 2017-06-29 VITALS — BP 145/103 | HR 78 | Temp 97.6°F | Resp 16 | Ht 73.0 in | Wt 206.0 lb

## 2017-06-29 DIAGNOSIS — C160 Malignant neoplasm of cardia: Secondary | ICD-10-CM

## 2017-06-29 DIAGNOSIS — Z5112 Encounter for antineoplastic immunotherapy: Secondary | ICD-10-CM

## 2017-06-29 DIAGNOSIS — I10 Essential (primary) hypertension: Secondary | ICD-10-CM

## 2017-06-29 DIAGNOSIS — C155 Malignant neoplasm of lower third of esophagus: Secondary | ICD-10-CM

## 2017-06-29 DIAGNOSIS — C779 Secondary and unspecified malignant neoplasm of lymph node, unspecified: Secondary | ICD-10-CM

## 2017-06-29 DIAGNOSIS — C159 Malignant neoplasm of esophagus, unspecified: Secondary | ICD-10-CM

## 2017-06-29 DIAGNOSIS — Z720 Tobacco use: Secondary | ICD-10-CM

## 2017-06-29 DIAGNOSIS — D509 Iron deficiency anemia, unspecified: Secondary | ICD-10-CM

## 2017-06-29 DIAGNOSIS — Z5111 Encounter for antineoplastic chemotherapy: Secondary | ICD-10-CM

## 2017-06-29 LAB — COMPREHENSIVE METABOLIC PANEL
ALT: 22 U/L (ref 17–63)
ANION GAP: 8 (ref 5–15)
AST: 19 U/L (ref 15–41)
Albumin: 4.5 g/dL (ref 3.5–5.0)
Alkaline Phosphatase: 71 U/L (ref 38–126)
BUN: 8 mg/dL (ref 6–20)
CHLORIDE: 102 mmol/L (ref 101–111)
CO2: 25 mmol/L (ref 22–32)
Calcium: 9.1 mg/dL (ref 8.9–10.3)
Creatinine, Ser: 0.79 mg/dL (ref 0.61–1.24)
GFR calc Af Amer: >60 mL/min (ref >60)
GFR calc non Af Amer: >60 mL/min (ref >60)
GLUCOSE: 122 mg/dL — AB (ref 65–99)
POTASSIUM: 3.4 mmol/L — AB (ref 3.5–5.1)
SODIUM: 135 mmol/L (ref 135–145)
TOTAL PROTEIN: 7.4 g/dL (ref 6.5–8.1)
Total Bilirubin: 0.7 mg/dL (ref 0.3–1.2)

## 2017-06-29 LAB — URINALYSIS, DIPSTICK ONLY
BILIRUBIN URINE: NEGATIVE
Glucose, UA: NEGATIVE mg/dL
Hgb urine dipstick: NEGATIVE
KETONES UR: NEGATIVE mg/dL
LEUKOCYTES UA: NEGATIVE
NITRITE: NEGATIVE
Protein, ur: 30 mg/dL — AB
Specific Gravity, Urine: 1.023 (ref 1.005–1.030)
pH: 5 (ref 5.0–8.0)

## 2017-06-29 LAB — CBC WITH DIFFERENTIAL/PLATELET
BASOS ABS: 0 10*3/uL (ref 0.0–0.1)
Basophils Relative: 1 %
Eosinophils Absolute: 0 10*3/uL (ref 0.0–0.7)
Eosinophils Relative: 1 %
HEMATOCRIT: 38.2 % — AB (ref 39.0–52.0)
Hemoglobin: 13 g/dL (ref 13.0–17.0)
LYMPHS ABS: 1.5 10*3/uL (ref 0.7–4.0)
LYMPHS PCT: 36 %
MCH: 27.7 pg (ref 26.0–34.0)
MCHC: 34 g/dL (ref 30.0–36.0)
MCV: 81.3 fL (ref 78.0–100.0)
MONO ABS: 0.2 10*3/uL (ref 0.1–1.0)
MONOS PCT: 6 %
NEUTROS ABS: 2.4 10*3/uL (ref 1.7–7.7)
Neutrophils Relative %: 56 %
Platelets: 268 10*3/uL (ref 150–400)
RBC: 4.7 MIL/uL (ref 4.22–5.81)
RDW: 15.7 % — AB (ref 11.5–15.5)
WBC: 4.1 10*3/uL (ref 4.0–10.5)

## 2017-06-29 MED ORDER — HYDRALAZINE HCL 20 MG/ML IJ SOLN
10.0000 mg | Freq: Once | INTRAMUSCULAR | Status: AC
  Administered 2017-06-29: 10 mg via INTRAVENOUS
  Filled 2017-06-29: qty 0.5

## 2017-06-29 MED ORDER — SODIUM CHLORIDE 0.9 % IV SOLN: 10.0000 mg | Freq: Once | INTRAVENOUS | Status: DC

## 2017-06-29 MED ORDER — FAMOTIDINE IN NACL 20-0.9 MG/50ML-% IV SOLN
20.0000 mg | Freq: Once | INTRAVENOUS | Status: AC
  Administered 2017-06-29: 20 mg via INTRAVENOUS
  Filled 2017-06-29: qty 50

## 2017-06-29 MED ORDER — SODIUM CHLORIDE 0.9% FLUSH
10.0000 mL | INTRAVENOUS | Status: DC | PRN
  Administered 2017-06-29: 10 mL
  Filled 2017-06-29: qty 10

## 2017-06-29 MED ORDER — DEXAMETHASONE SODIUM PHOSPHATE 10 MG/ML IJ SOLN
10.0000 mg | Freq: Once | INTRAMUSCULAR | Status: AC
  Administered 2017-06-29: 10 mg via INTRAVENOUS
  Filled 2017-06-29: qty 1

## 2017-06-29 MED ORDER — RAMUCIRUMAB CHEMO INJECTION 500 MG/50ML
8.0000 mg/kg | Freq: Once | INTRAVENOUS | Status: AC
  Administered 2017-06-29: 800 mg via INTRAVENOUS
  Filled 2017-06-29: qty 50

## 2017-06-29 MED ORDER — PACLITAXEL CHEMO INJECTION 300 MG/50ML
80.0000 mg/m2 | Freq: Once | INTRAVENOUS | Status: AC
  Administered 2017-06-29: 174 mg via INTRAVENOUS
  Filled 2017-06-29: qty 29

## 2017-06-29 MED ORDER — ACETAMINOPHEN 325 MG PO TABS
650.0000 mg | ORAL_TABLET | Freq: Once | ORAL | Status: AC
  Administered 2017-06-29: 650 mg via ORAL
  Filled 2017-06-29: qty 2

## 2017-06-29 MED ORDER — HEPARIN SOD (PORK) LOCK FLUSH 100 UNIT/ML IV SOLN
500.0000 [IU] | Freq: Once | INTRAVENOUS | Status: AC | PRN
  Administered 2017-06-29: 500 [IU]
  Filled 2017-06-29: qty 5

## 2017-06-29 MED ORDER — DIPHENHYDRAMINE HCL 50 MG/ML IJ SOLN
50.0000 mg | Freq: Once | INTRAMUSCULAR | Status: AC
  Administered 2017-06-29: 50 mg via INTRAVENOUS
  Filled 2017-06-29: qty 1

## 2017-06-29 MED ORDER — LOSARTAN POTASSIUM 50 MG PO TABS
50.0000 mg | ORAL_TABLET | Freq: Every day | ORAL | 1 refills | Status: DC
Start: 2017-06-29 — End: 2017-08-30

## 2017-06-29 MED ORDER — SODIUM CHLORIDE 0.9 % IV SOLN
Freq: Once | INTRAVENOUS | Status: AC
  Administered 2017-06-29: 11:00:00 via INTRAVENOUS

## 2017-06-29 NOTE — Progress Notes (Signed)
Labs reviewed with MD.Proceed with treatment.  BP elevated, will recheck and monitor appropriately.   1100-BP still elevated-see flowsheet, MD notified, will give medicine per MD order.   1310-BP still elevated- MD aware, will give additional order per MD.   1355-Blood pressure 175/95. MD made aware. No new orders. Encouraged patient to get his BP checked later today and periodically till he comes back to the clinic.   Treatment given per orders. Patient tolerated it well without problems. Vitals stable and discharged home from clinic ambulatory. Follow up as scheduled.

## 2017-06-29 NOTE — Patient Instructions (Signed)
[facility] Discharge Instructions for Patients Receiving Chemotherapy   Beginning January 23rd 2017 lab work for the Cancer Center will be done in the  Main lab at [place] on 1st floor. If you have a lab appointment with the Cancer Center please come in thru the  Main Entrance and check in at the main information desk   Today you received the following chemotherapy agents   To help prevent nausea and vomiting after your treatment, we encourage you to take your nausea medication     If you develop nausea and vomiting, or diarrhea that is not controlled by your medication, call the clinic.  The clinic phone number is [phone]. Office hours are Monday-Friday 8:30am-5:00pm.  BELOW ARE SYMPTOMS THAT SHOULD BE REPORTED IMMEDIATELY:  *FEVER GREATER THAN 101.0 F  *CHILLS WITH OR WITHOUT FEVER  NAUSEA AND VOMITING THAT IS NOT CONTROLLED WITH YOUR NAUSEA MEDICATION  *UNUSUAL SHORTNESS OF BREATH  *UNUSUAL BRUISING OR BLEEDING  TENDERNESS IN MOUTH AND THROAT WITH OR WITHOUT PRESENCE OF ULCERS  *URINARY PROBLEMS  *BOWEL PROBLEMS  UNUSUAL RASH Items with * indicate a potential emergency and should be followed up as soon as possible. If you have an emergency after office hours please contact your primary care physician or go to the nearest emergency department.  Please call the clinic during office hours if you have any questions or concerns.   You may also contact the Patient Navigator at [phone] should you have any questions or need assistance in obtaining follow up care.      Resources For Cancer Patients and their Caregivers ? American Cancer Society: Can assist with transportation, wigs, general needs, runs Look Good Feel Better.        1-888-227-6333 ? Cancer Care: Provides financial assistance, online support groups, medication/co-pay assistance.  1-800-813-HOPE (4673) ? [facility] Assists [place] cancer  patients and their families through emotional , educational and financial support.  336-427-4357 ? [place] DSS Where to apply for food stamps, Medicaid and utility assistance. 336-342-1394 ? [facility]: Transportation to medical appointments. 336-347-2287 ? Social Security Administration: May apply for disability if have a Stage IV cancer. 336-342-7796 1-800-772-1213 ? [place] Aging, Disability and Transit Services: Assists with nutrition, care and transit needs. 336-349-2343         

## 2017-06-29 NOTE — Progress Notes (Signed)
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 Barney Drain. Partially obstructing, likely malignant esophageal tumor was found in the distal esophagus. Biopsied with surgical path demonstrating Adenocarcinoma. Injected. Treated with argon plasma coagulation (APC) #3. 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 Biopsied with surgical path demonstrating Adenocarcinoma.      01/19/2017 PET scan    NECK Two FDG avid nodes are seen in the base of the neck on the right. The first is seen on CT image 35, just posterior to the right thyroid lobe and the other is seen on CT image 41 measuring 16 mm in short axis with a maximum SUV of 8.6.  CHEST  The patient's known malignancy involving the distal esophagus and cardia of the stomach is FDG avid with a maximum SUV of 10.4. No definitive metastatic nodes within the chest.  ABDOMEN/PELVIS  FDG avid metastatic adenopathy is seen in the gastrohepatic ligament, paraceliac nodes, posterior to the IVC on image 111, and to the left of the SMA on image 112. The most inferior node is seen in the aortocaval region on image 117. The representative node to the left of the SMA was measured on series 4, image 111 measuring 2.5 by 2.1 cm with a maximum SUV of 7. No other FDG avid disease in the abdomen or pelvis. Cholelithiasis is identified.      02/02/2017 Procedure    US guided bx of right supraclavicular LN by IR.      02/05/2017 Pathology Results     Lymph node, needle/core biopsy, Right Supraclavicular - METASTATIC POORLY DIFFERENTIATED CARCINOMA.      02/10/2017 Pathology Results    HER2 - NEGATIVE      02/13/2017 Procedure    Port placed by IR      02/15/2017 - 04/25/2017 Chemotherapy    FOLFOX x 6 cycles       05/02/2017 Imaging    CT C/A/P: IMPRESSION: 1. Nodular thickening and hyperenhancement in the distal esophagus and gastric cardia could reflect residual tumor. 2. No significant change in size of previously demonstrated hypermetabolic upper abdominal lymph nodes. No progressive adenopathy identified. No residual enlarged supraclavicular nodes are seen. 3. No evidence of progressive metastatic disease. No worrisome hepatic findings. 4. Tiny right upper lobe pulmonary nodule, not clearly seen on low dose previous PET-CT. Attention on follow-up recommended. 5. Cholelithiasis.        INTERVAL HISTORY:  [name] Henderson 51 y.o. male returns for routine follow-up for esophageal cancer and cycle 2 day 15 of paclitaxel and ramucirumab.Patient has been tolerating chemotherapy well with minimal side effects. His appetite is good, no weight loss. He denies any chest pain, shortness breath, abdominal pain, focal weakness, no neuropathy. He continues to work full time.  REVIEW OF SYSTEMS:  Review of Systems  Constitutional: Negative for chills, fatigue and fever.  HENT:  Negative.  Negative for lump/mass and nosebleeds.   Eyes: Negative.   Respiratory: Negative.  Negative for cough and shortness of breath.  Cardiovascular: Negative.  Negative for chest pain and leg swelling.  Gastrointestinal: Negative.  Negative for abdominal pain, blood in stool, constipation, diarrhea, nausea and vomiting.  Endocrine: Negative.   Genitourinary: Negative.  Negative for dysuria and hematuria.   Musculoskeletal: Negative.  Negative for arthralgias.  Skin: Negative.  Negative for rash.  Neurological: Negative.  Negative for dizziness and  headaches.  Hematological: Negative.  Negative for adenopathy. Does not bruise/bleed easily.  Psychiatric/Behavioral: Negative.  Negative for depression and sleep disturbance. The patient is not nervous/anxious.      PAST MEDICAL/SURGICAL HISTORY:  Past Medical History:  Diagnosis Date  . Diabetes mellitus without complication (Maxwell Henderson)   . Hypertension   . 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 ESOPHAGOGASTRODUODENOSCOPY (EGD) WITH PROPOFOL N/A 01/16/2017   Performed by [name], MD at [facility]  . IR FLUORO GUIDE PORT INSERTION RIGHT  02/13/2017  . IR US GUIDE VASC ACCESS RIGHT  02/13/2017  . lipoma removal    . PORTA CATH INSERTION Right 02/13/2017  . SAVORY DILATION N/A 01/16/2017   Performed by [name], MD at [facility]:  Social History   Socioeconomic History  . Marital status: Married    Spouse name: Not on file  . Number of children: Not on file  . Years of education: Not on file  . Highest education level: Not on file  Social Needs  . Financial resource strain: Not on file  . Food insecurity - worry: Not on file  . Food insecurity - inability: Not on file  . Transportation needs - medical: Not on file  . Transportation needs - non-medical: Not on file  Occupational History  . Not on file  Tobacco Use  . Smoking status: Current Some Day Smoker    Packs/day: 0.25    Types: Cigarettes  . Smokeless tobacco: Never Used  Substance and Sexual Activity  . Alcohol use: No    Comment: hx heavy alcohol, hasn't drank in 19 yrs  . Drug use: Yes    Types: Marijuana    Comment: history of marijuana   . Sexual activity: Yes  Other Topics Concern  . Not on file  Social History Narrative  . Not on file    FAMILY HISTORY:  Family History  Problem Relation Age of Onset  . Prostate cancer Father   . Colon  cancer Neg Hx   . Colon polyps Neg Hx     CURRENT MEDICATIONS:  Outpatient Encounter Medications as of 06/29/2017  Medication Sig  . colchicine 0.6 MG tablet Take 1 tablet (0.6 mg total) by mouth daily.  Marland Kitchen dexamethasone (DECADRON) 4 MG tablet Take 2 tablets (8 mg total) by mouth daily. Start the day after chemotherapy for 2 days. Take with food.  . Diphenhyd-Hydrocort-Nystatin (FIRST-DUKES MOUTHWASH) SUSP Use as directed 5 mLs in the mouth or throat 4 (four) times daily as needed.  . loratadine (CLARITIN) 10 MG tablet Take 10 mg by mouth daily.  . metFORMIN (GLUCOPHAGE) 500 MG tablet Take 500 mg by mouth daily.  . Morphine Sulfate (MORPHINE CONCENTRATE) 10 mg / 0.5 ml concentrated solution Take 0.5 mLs (10 mg total) by mouth every 4 (four) hours as needed for severe pain.  Marland Kitchen PACLitaxel (TAXOL IV) Inject into the vein.  . pantoprazole (PROTONIX) 40 MG tablet Take 1 tablet (40  mg total) by mouth 2 (two) times daily. Take 30 minutes before breakfast  . polyethylene glycol powder (GLYCOLAX/MIRALAX) powder Take 1 capful daily.  . prochlorperazine (COMPAZINE) 10 MG tablet TAKE 1 TABLET BY MOUTH EVERY 6 HOURS AS NEEDED FOR NAUSEA AND VOMITING  . Ramucirumab (CYRAMZA IV) Inject into the vein.  . ranitidine (ZANTAC) 300 MG tablet Take 1 tablet (300 mg total) by mouth at bedtime.  . temazepam (RESTORIL) 30 MG capsule Take 1 capsule (30 mg total) by mouth at bedtime as needed for sleep.  . [DISCONTINUED] losartan (COZAAR) 25 MG tablet Take 25 mg by mouth daily.   No facility-administered encounter medications on file as of 06/29/2017.     ALLERGIES:  No Known Allergies   PHYSICAL EXAM:  ECOG Performance status: 1 - Symptomatic; remains independent      Physical Exam  Constitutional: He is oriented to person, place, and time and well-developed, well-nourished, and in no distress.  HENT:  Head: Normocephalic.  Mouth/Throat: Oropharynx is clear and moist.  Eyes: Conjunctivae are normal. No  scleral icterus.  Neck: Normal range of motion. Neck supple.  Cardiovascular: Normal rate and regular rhythm.  Pulmonary/Chest: Effort normal and breath sounds normal. No respiratory distress.  Abdominal: Soft. Bowel sounds are normal. There is no tenderness.  Musculoskeletal: Normal range of motion. He exhibits no edema.  Lymphadenopathy:    He has no cervical adenopathy.       Right: No supraclavicular adenopathy present.       Left: No supraclavicular adenopathy present.  Neurological: He is alert and oriented to person, place, and time. No cranial nerve deficit.  Skin: Skin is warm and dry. No rash noted.  Psychiatric: Mood, memory, affect and judgment normal.  Nursing note and vitals reviewed.    LABORATORY DATA:  I have reviewed the labs as listed.  CBC    Component Value Date/Time   WBC 4.1 06/29/2017 0924   RBC 4.70 06/29/2017 0924   HGB 13.0 06/29/2017 0924   HCT 38.2 (L) 06/29/2017 0924   PLT 268 06/29/2017 0924   MCV 81.3 06/29/2017 0924   MCH 27.7 06/29/2017 0924   MCHC 34.0 06/29/2017 0924   RDW 15.7 (H) 06/29/2017 0924   LYMPHSABS 1.5 06/29/2017 0924   MONOABS 0.2 06/29/2017 0924   EOSABS 0.0 06/29/2017 0924   BASOSABS 0.0 06/29/2017 0924   CMP Latest Ref Rng & Units 06/29/2017 06/22/2017 06/15/2017  Glucose 65 - 99 mg/dL 122(H) 90 91  BUN 6 - 20 mg/dL _0 Creatinine 0.61 - 1.24 mg/dL 0.79 0.83 0.86  Sodium 135 - 145 mmol/L 135 137 139  Potassium 3.5 - 5.1 mmol/L 3.4(L) 3.6 3.5  Chloride 101 - 111 mmol/L 102 103 104  CO2 22 - 32 mmol/L _1 Calcium 8.9 - 10.3 mg/dL 9.1 9.1 8.9  Total Protein 6.5 - 8.1 g/dL 7.4 7.3 6.9  Total Bilirubin 0.3 - 1.2 mg/dL 0.7 0.6 0.7  Alkaline Phos 38 - 126 U/L 71 90 56  AST 15 - 41 U/L _2 ALT 17 - 63 U/L 22 31 14(L)    PENDING LABS:    DIAGNOSTIC IMAGING:  *The following radiologic images and reports have been reviewed independently and agree with below findings.  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 Oneida Alar.  PET scan 01/19/17 revealed primary malignancy involving distal esophagus and gastric cardia, with nodes at base of right neck most consistent with metastatic disease; also metastatic nodes in upper abdomen. Underwent (R) supraclavicular lymph node biopsy on 01/16/17 revealing metastatic poorly differentiated carcinoma; HER2-.  He did not undergo EUS prior to treatment.  Started systemic treatment with FOLFOX chemotherapy on 02/15/17.  -Patient has completed 6 cycles of FOLFOX to date. His repeat CT C/A/P demonstrated partial response with resolution of his right supraclavicular lymphadenopathy, however he still has residual thickening/hyperenhancement in the distal esophagus/gastric cardia and no change in his abdominal lymphadenopathy. -Continue cyramza with paclitaxel cycle 2 day 15 today. Labs reviewed. Continue chemo as scheduled.    Iron deficiency anemia:  -Likely from esophageal malignancy/ulceration.  -s/p 2 doses of Feraheme on 03/02/17 & 03/09/17 for low ferritin of 9 . -Received 1 dose of feraheme on 04/25/17.  -Hemoglobin is 13 g/dl today.  Hypertension -Patient stated he is taking losartan 65m PO daily. -His diastolic is over 1579today. He stated compliance with his losartan and he was asymptomatic.  -I discussed increasing his losartan dose or adding a 2nd antihypertensive however patient refused because he stated that in the past he's had problems with erectile dysfunction if he is on 2 BP meds or the dose of his BP meds increase. -Will give him a dose of hydralazine 168mIV x 1 to improve his BP. -Advised him to monitor his BP at home over the next few days. If he has any symptoms of hypertensive emergency/urgency, he is to go to the  ER.   Dispo:  -RTC in 4 weeks for follow up.    All questions were answered to patient's stated satisfaction. Encouraged patient to call with any new concerns or questions before his next visit to the cancer center and we can certain see him sooner, if needed.    LoTwana FirstMD

## 2017-07-09 ENCOUNTER — Telehealth (HOSPITAL_COMMUNITY): Payer: Self-pay | Admitting: *Deleted

## 2017-07-09 NOTE — Telephone Encounter (Signed)
Patient is experiencing numbness in his toes per wife. I asked wife to assess his numbness tonight by having him close his eyes and her touch his toes with light pressure and then sharp pressure to see if he can distinguish the sensation. Wife will let us know. I also instructed her that we don't want to see the numbness moving up the foot. So she needs to contact [place] if it begins to travel up his foot. She verbalized understanding of all instructions and plans on calling us to let us know if his numbness worsens.

## 2017-07-13 ENCOUNTER — Ambulatory Visit (HOSPITAL_COMMUNITY): Payer: Self-pay

## 2017-07-13 ENCOUNTER — Other Ambulatory Visit: Payer: Self-pay

## 2017-07-13 ENCOUNTER — Encounter (HOSPITAL_COMMUNITY): Payer: Self-pay

## 2017-07-13 ENCOUNTER — Encounter (HOSPITAL_BASED_OUTPATIENT_CLINIC_OR_DEPARTMENT_OTHER): Payer: Self-pay

## 2017-07-13 VITALS — BP 156/105 | HR 73 | Temp 97.5°F | Resp 20 | Wt 211.2 lb

## 2017-07-13 DIAGNOSIS — R05 Cough: Secondary | ICD-10-CM

## 2017-07-13 DIAGNOSIS — C159 Malignant neoplasm of esophagus, unspecified: Secondary | ICD-10-CM

## 2017-07-13 DIAGNOSIS — Z5112 Encounter for antineoplastic immunotherapy: Secondary | ICD-10-CM

## 2017-07-13 DIAGNOSIS — Z5111 Encounter for antineoplastic chemotherapy: Secondary | ICD-10-CM

## 2017-07-13 DIAGNOSIS — C779 Secondary and unspecified malignant neoplasm of lymph node, unspecified: Secondary | ICD-10-CM

## 2017-07-13 DIAGNOSIS — R059 Cough, unspecified: Secondary | ICD-10-CM

## 2017-07-13 DIAGNOSIS — C155 Malignant neoplasm of lower third of esophagus: Secondary | ICD-10-CM

## 2017-07-13 DIAGNOSIS — C160 Malignant neoplasm of cardia: Secondary | ICD-10-CM

## 2017-07-13 LAB — URINALYSIS, DIPSTICK ONLY
Bilirubin Urine: NEGATIVE
GLUCOSE, UA: NEGATIVE mg/dL
HGB URINE DIPSTICK: NEGATIVE
KETONES UR: NEGATIVE mg/dL
LEUKOCYTES UA: NEGATIVE
Nitrite: NEGATIVE
PH: 5 (ref 5.0–8.0)
PROTEIN: 30 mg/dL — AB
Specific Gravity, Urine: 1.027 (ref 1.005–1.030)

## 2017-07-13 LAB — COMPREHENSIVE METABOLIC PANEL
ALT: 17 U/L (ref 17–63)
AST: 26 U/L (ref 15–41)
Albumin: 4.5 g/dL (ref 3.5–5.0)
Alkaline Phosphatase: 61 U/L (ref 38–126)
Anion gap: 8 (ref 5–15)
BILIRUBIN TOTAL: 0.7 mg/dL (ref 0.3–1.2)
BUN: 9 mg/dL (ref 6–20)
CO2: 26 mmol/L (ref 22–32)
CREATININE: 0.87 mg/dL (ref 0.61–1.24)
Calcium: 9.1 mg/dL (ref 8.9–10.3)
Chloride: 104 mmol/L (ref 101–111)
Glucose, Bld: 84 mg/dL (ref 65–99)
Potassium: 3.4 mmol/L — ABNORMAL LOW (ref 3.5–5.1)
Sodium: 138 mmol/L (ref 135–145)
TOTAL PROTEIN: 7.5 g/dL (ref 6.5–8.1)

## 2017-07-13 LAB — CBC WITH DIFFERENTIAL/PLATELET
Basophils Absolute: 0 10*3/uL (ref 0.0–0.1)
Basophils Relative: 1 %
EOS PCT: 2 %
Eosinophils Absolute: 0.1 10*3/uL (ref 0.0–0.7)
HCT: 39.5 % (ref 39.0–52.0)
Hemoglobin: 12.9 g/dL — ABNORMAL LOW (ref 13.0–17.0)
LYMPHS ABS: 1.9 10*3/uL (ref 0.7–4.0)
LYMPHS PCT: 35 %
MCH: 27.3 pg (ref 26.0–34.0)
MCHC: 32.7 g/dL (ref 30.0–36.0)
MCV: 83.5 fL (ref 78.0–100.0)
MONO ABS: 0.7 10*3/uL (ref 0.1–1.0)
Monocytes Relative: 13 %
Neutro Abs: 2.6 10*3/uL (ref 1.7–7.7)
Neutrophils Relative %: 49 %
PLATELETS: 300 10*3/uL (ref 150–400)
RBC: 4.73 MIL/uL (ref 4.22–5.81)
RDW: 15 % (ref 11.5–15.5)
WBC: 5.3 10*3/uL (ref 4.0–10.5)

## 2017-07-13 MED ORDER — SODIUM CHLORIDE 0.9 % IV SOLN: 10.0000 mg | Freq: Once | INTRAVENOUS | Status: DC

## 2017-07-13 MED ORDER — SODIUM CHLORIDE 0.9 % IV SOLN
8.0000 mg/kg | Freq: Once | INTRAVENOUS | Status: AC
  Administered 2017-07-13: 800 mg via INTRAVENOUS
  Filled 2017-07-13: qty 30

## 2017-07-13 MED ORDER — HEPARIN SOD (PORK) LOCK FLUSH 100 UNIT/ML IV SOLN
500.0000 [IU] | Freq: Once | INTRAVENOUS | Status: AC | PRN
  Administered 2017-07-13: 500 [IU]
  Filled 2017-07-13: qty 5

## 2017-07-13 MED ORDER — MISC. DEVICES MISC: 0 refills | Status: DC

## 2017-07-13 MED ORDER — SODIUM CHLORIDE 0.9 % IV SOLN
Freq: Once | INTRAVENOUS | Status: AC
  Administered 2017-07-13: 11:00:00 via INTRAVENOUS

## 2017-07-13 MED ORDER — DEXAMETHASONE SODIUM PHOSPHATE 10 MG/ML IJ SOLN
10.0000 mg | Freq: Once | INTRAMUSCULAR | Status: AC
  Administered 2017-07-13: 10 mg via INTRAVENOUS
  Filled 2017-07-13: qty 1

## 2017-07-13 MED ORDER — DIPHENHYDRAMINE HCL 50 MG/ML IJ SOLN
50.0000 mg | Freq: Once | INTRAMUSCULAR | Status: AC
  Administered 2017-07-13: 50 mg via INTRAVENOUS
  Filled 2017-07-13: qty 1

## 2017-07-13 MED ORDER — PACLITAXEL CHEMO INJECTION 300 MG/50ML
80.0000 mg/m2 | Freq: Once | INTRAVENOUS | Status: AC
  Administered 2017-07-13: 174 mg via INTRAVENOUS
  Filled 2017-07-13: qty 29

## 2017-07-13 MED ORDER — ACETAMINOPHEN 325 MG PO TABS
650.0000 mg | ORAL_TABLET | Freq: Once | ORAL | Status: AC
  Administered 2017-07-13: 650 mg via ORAL
  Filled 2017-07-13: qty 2

## 2017-07-13 MED ORDER — FAMOTIDINE IN NACL 20-0.9 MG/50ML-% IV SOLN
20.0000 mg | Freq: Once | INTRAVENOUS | Status: AC
  Administered 2017-07-13: 20 mg via INTRAVENOUS
  Filled 2017-07-13: qty 50

## 2017-07-13 NOTE — Progress Notes (Signed)
Tolerated infusions w/o adverse reaction.  Alert, in no distress.  VSS.  Discharged ambulatory in c/o spouse.  

## 2017-07-18 ENCOUNTER — Ambulatory Visit (INDEPENDENT_AMBULATORY_CARE_PROVIDER_SITE_OTHER): Payer: Self-pay | Admitting: Gastroenterology

## 2017-07-18 ENCOUNTER — Encounter: Payer: Self-pay | Admitting: Gastroenterology

## 2017-07-18 DIAGNOSIS — R1310 Dysphagia, unspecified: Secondary | ICD-10-CM

## 2017-07-18 DIAGNOSIS — C159 Malignant neoplasm of esophagus, unspecified: Secondary | ICD-10-CM

## 2017-07-18 DIAGNOSIS — R1319 Other dysphagia: Secondary | ICD-10-CM

## 2017-07-18 NOTE — Progress Notes (Signed)
cc'd to pcp 

## 2017-07-18 NOTE — Assessment & Plan Note (Signed)
RECEIVING CHEMO AND CLINICALLY IMPROVED. LAST CT SHOWED PARTIAL RESPONSE.  CALL WITH QUESTIONS OR CONCERNS. FOLLOW UP IN 1 YEAR.

## 2017-07-18 NOTE — Assessment & Plan Note (Signed)
SYMPTOMS CONTROLLED/RESOLVED after chemo.  CONTINUE TO MONITOR SYMPTOMS.

## 2017-07-18 NOTE — Patient Instructions (Signed)
Great to see your doing well!  FOLLOW UP IN 1 YEAR.   Please CALL WITH QUESTIONS OR CONCERNS.

## 2017-07-18 NOTE — Progress Notes (Signed)
Subjective:    Patient ID: Maxwell Henderson, male    DOB: 04-06-1966, 51 y.o.   MRN: [id number] [name], FNP  HPI Can eat good and gaining weight back. NO SWALLOWING PROBLEMS. TOLERATING CHEMO. NOT NEEDED XRT. WORKING EVERY DAY. BMs GOOD. BEEN HAVING HEARTBURN A LITTLE. TAKING PROTONIX IN AM AND RANITIDINE QHS.   PT DENIES FEVER, CHILLS, HEMATOCHEZIA, HEMATEMESIS, nausea, vomiting, melena, diarrhea, CHEST PAIN, SHORTNESS OF BREATH,  CHANGE IN BOWEL IN HABITS, constipation, abdominal pain, OR problems swallowing, OR heartburn or indigestion.  Past Medical History:  Diagnosis Date  . Diabetes mellitus without complication ([place])   . Hypertension   . 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 ESOPHAGOGASTRODUODENOSCOPY (EGD) WITH PROPOFOL N/A 01/16/2017   Procedure: ESOPHAGOGASTRODUODENOSCOPY (EGD) WITH PROPOFOL;  Surgeon: [name], MD;  Location: [facility];  Service: Endoscopy;  Laterality: N/A;  730   . IR FLUORO GUIDE PORT INSERTION RIGHT  02/13/2017  . IR US GUIDE VASC ACCESS RIGHT  02/13/2017  . lipoma removal    . PORTA CATH INSERTION Right 02/13/2017  . SAVORY DILATION N/A 01/16/2017   Procedure: SAVORY DILATION;  Surgeon: [name], MD;  Location: [facility];  Service: Endoscopy;  Laterality: N/A;   No Known Allergies  Current Outpatient Medications  Medication Sig Dispense Refill  . dexamethasone (DECADRON) 4 MG tablet Take 2 tablets (8 mg total) by mouth daily. Start the day after chemotherapy for 2 days. Take with food.    . Diphenhyd-Hydrocort-Nystatin (FIRST-DUKES MOUTHWASH) SUSP Use as directed 5 mLs in the mouth or throat 4 (four) times daily as needed.    . loratadine (CLARITIN) 10 MG tablet Take 10 mg by mouth daily.    Marland Kitchen losartan (COZAAR) 50 MG tablet Take 1 tablet (50 mg total) daily by mouth.    . metFORMIN (GLUCOPHAGE)  500 MG tablet Take 500 mg by mouth daily.    . Misc. Devices MISC Please provide the patient with the [facility] HPB cough Syrup. 1 teaspoonful every 4-6 hours PRN. Please provide the patient with 360 ml.    . Morphine Sulfate (MORPHINE CONCENTRATE) 10 mg / 0.5 ml concentrated solution Take 0.5 mLs (10 mg total) by mouth every 4 (four) hours as needed for severe pain.    Marland Kitchen PACLitaxel (TAXOL IV) Inject into the vein.    . pantoprazole (PROTONIX) 40 MG tablet Take 1 tablet (40 mg total) by mouth 2 (two) times daily. Take 30 minutes before breakfast    . polyethylene glycol powder (GLYCOLAX/MIRALAX) powder Take 1 capful daily.    . prochlorperazine (COMPAZINE) 10 MG tablet TAKE 1 TABLET BY MOUTH EVERY 6 HOURS AS NEEDED FOR NAUSEA AND VOMITING    . Ramucirumab (CYRAMZA IV) Inject into the vein.    . ranitidine (ZANTAC) 300 MG tablet Take 1 tablet (300 mg total) by mouth at bedtime.    . temazepam (RESTORIL) 30 MG capsule Take 1 capsule (30 mg total) by mouth at bedtime as needed for sleep.    . colchicine 0.6 MG tablet Take 1 tablet (0.6 mg total) by mouth daily. (Patient not taking: Reported on 07/18/2017)     Review of Systems PER HPI OTHERWISE ALL SYSTEMS ARE NEGATIVE.    Objective:   Physical Exam  Constitutional: He is oriented to person, place, and time. He appears well-developed and well-nourished. No distress.  HENT:  Head: Normocephalic and atraumatic.  Mouth/Throat: Oropharynx is clear and moist. No oropharyngeal exudate.  Eyes: Pupils are equal, round, and reactive to light. No scleral icterus.  Neck: Normal range of motion. Neck supple.  Cardiovascular: Normal rate, regular rhythm and normal heart sounds.  Pulmonary/Chest: Effort normal and breath sounds normal. No respiratory distress.  Abdominal: Soft. Bowel sounds are normal. He exhibits no distension. There is no tenderness.  Musculoskeletal: He exhibits no edema.  Lymphadenopathy:    He has no cervical adenopathy.    Neurological: He is alert and oriented to person, place, and time.  Psychiatric: He has a normal mood and affect.  Vitals reviewed.     Assessment & Plan:

## 2017-07-18 NOTE — Progress Notes (Signed)
ON RECALL  °

## 2017-07-20 ENCOUNTER — Ambulatory Visit (HOSPITAL_COMMUNITY): Payer: Self-pay

## 2017-07-20 ENCOUNTER — Encounter (HOSPITAL_COMMUNITY): Payer: Self-pay

## 2017-07-20 ENCOUNTER — Encounter (HOSPITAL_COMMUNITY): Payer: Self-pay | Attending: Oncology

## 2017-07-20 ENCOUNTER — Telehealth (HOSPITAL_COMMUNITY): Payer: Self-pay | Admitting: Oncology

## 2017-07-20 VITALS — BP 164/100 | HR 74 | Temp 97.9°F | Resp 18 | Wt 209.8 lb

## 2017-07-20 DIAGNOSIS — D509 Iron deficiency anemia, unspecified: Secondary | ICD-10-CM | POA: Insufficient documentation

## 2017-07-20 DIAGNOSIS — C779 Secondary and unspecified malignant neoplasm of lymph node, unspecified: Secondary | ICD-10-CM

## 2017-07-20 DIAGNOSIS — Z5111 Encounter for antineoplastic chemotherapy: Secondary | ICD-10-CM

## 2017-07-20 DIAGNOSIS — C160 Malignant neoplasm of cardia: Secondary | ICD-10-CM

## 2017-07-20 DIAGNOSIS — G4701 Insomnia due to medical condition: Secondary | ICD-10-CM | POA: Insufficient documentation

## 2017-07-20 DIAGNOSIS — E876 Hypokalemia: Secondary | ICD-10-CM | POA: Insufficient documentation

## 2017-07-20 DIAGNOSIS — C159 Malignant neoplasm of esophagus, unspecified: Secondary | ICD-10-CM | POA: Insufficient documentation

## 2017-07-20 DIAGNOSIS — C155 Malignant neoplasm of lower third of esophagus: Secondary | ICD-10-CM

## 2017-07-20 LAB — CBC WITH DIFFERENTIAL/PLATELET
Basophils Absolute: 0 10*3/uL (ref 0.0–0.1)
Basophils Relative: 0 %
EOS ABS: 0.1 10*3/uL (ref 0.0–0.7)
EOS PCT: 1 %
HCT: 39.2 % (ref 39.0–52.0)
HEMOGLOBIN: 13 g/dL (ref 13.0–17.0)
LYMPHS ABS: 1.9 10*3/uL (ref 0.7–4.0)
Lymphocytes Relative: 35 %
MCH: 27.5 pg (ref 26.0–34.0)
MCHC: 33.2 g/dL (ref 30.0–36.0)
MCV: 83.1 fL (ref 78.0–100.0)
MONO ABS: 0.2 10*3/uL (ref 0.1–1.0)
MONOS PCT: 3 %
Neutro Abs: 3.4 10*3/uL (ref 1.7–7.7)
Neutrophils Relative %: 61 %
Platelets: 290 10*3/uL (ref 150–400)
RBC: 4.72 MIL/uL (ref 4.22–5.81)
RDW: 14.4 % (ref 11.5–15.5)
WBC: 5.5 10*3/uL (ref 4.0–10.5)

## 2017-07-20 LAB — COMPREHENSIVE METABOLIC PANEL
ALK PHOS: 55 U/L (ref 38–126)
ALT: 13 U/L — AB (ref 17–63)
AST: 17 U/L (ref 15–41)
Albumin: 4.4 g/dL (ref 3.5–5.0)
Anion gap: 7 (ref 5–15)
BUN: 7 mg/dL (ref 6–20)
CALCIUM: 9.2 mg/dL (ref 8.9–10.3)
CHLORIDE: 104 mmol/L (ref 101–111)
CO2: 25 mmol/L (ref 22–32)
CREATININE: 0.78 mg/dL (ref 0.61–1.24)
GFR calc Af Amer: >60 mL/min (ref >60)
GFR calc non Af Amer: >60 mL/min (ref >60)
GLUCOSE: 111 mg/dL — AB (ref 65–99)
Potassium: 3.2 mmol/L — ABNORMAL LOW (ref 3.5–5.1)
SODIUM: 136 mmol/L (ref 135–145)
Total Bilirubin: 0.9 mg/dL (ref 0.3–1.2)
Total Protein: 7.4 g/dL (ref 6.5–8.1)

## 2017-07-20 MED ORDER — HEPARIN SOD (PORK) LOCK FLUSH 100 UNIT/ML IV SOLN
500.0000 [IU] | Freq: Once | INTRAVENOUS | Status: AC | PRN
Start: 2017-07-20 — End: 2017-07-20
  Administered 2017-07-20: 500 [IU]
  Filled 2017-07-20: qty 5

## 2017-07-20 MED ORDER — FAMOTIDINE IN NACL 20-0.9 MG/50ML-% IV SOLN
20.0000 mg | Freq: Once | INTRAVENOUS | Status: AC
  Administered 2017-07-20: 20 mg via INTRAVENOUS

## 2017-07-20 MED ORDER — POTASSIUM CHLORIDE CRYS ER 20 MEQ PO TBCR
EXTENDED_RELEASE_TABLET | ORAL | Status: AC
  Filled 2017-07-20: qty 2

## 2017-07-20 MED ORDER — DIPHENHYDRAMINE HCL 50 MG/ML IJ SOLN
50.0000 mg | Freq: Once | INTRAMUSCULAR | Status: AC
  Administered 2017-07-20: 50 mg via INTRAVENOUS

## 2017-07-20 MED ORDER — FAMOTIDINE IN NACL 20-0.9 MG/50ML-% IV SOLN
INTRAVENOUS | Status: AC
  Filled 2017-07-20: qty 50

## 2017-07-20 MED ORDER — SODIUM CHLORIDE 0.9% FLUSH
10.0000 mL | INTRAVENOUS | Status: DC | PRN
  Administered 2017-07-20: 10 mL
  Filled 2017-07-20: qty 10

## 2017-07-20 MED ORDER — PACLITAXEL CHEMO INJECTION 300 MG/50ML
80.0000 mg/m2 | Freq: Once | INTRAVENOUS | Status: AC
  Administered 2017-07-20: 174 mg via INTRAVENOUS
  Filled 2017-07-20: qty 29

## 2017-07-20 MED ORDER — SODIUM CHLORIDE 0.9 % IV SOLN
Freq: Once | INTRAVENOUS | Status: AC
  Administered 2017-07-20: 10:00:00 via INTRAVENOUS

## 2017-07-20 MED ORDER — SODIUM CHLORIDE 0.9 % IV SOLN: 10.0000 mg | Freq: Once | INTRAVENOUS | Status: DC

## 2017-07-20 MED ORDER — DIPHENHYDRAMINE HCL 50 MG/ML IJ SOLN
INTRAMUSCULAR | Status: AC
  Filled 2017-07-20: qty 1

## 2017-07-20 MED ORDER — DEXAMETHASONE SODIUM PHOSPHATE 10 MG/ML IJ SOLN
INTRAMUSCULAR | Status: AC
  Filled 2017-07-20: qty 1

## 2017-07-20 MED ORDER — POTASSIUM CHLORIDE CRYS ER 20 MEQ PO TBCR
40.0000 meq | EXTENDED_RELEASE_TABLET | Freq: Once | ORAL | Status: AC
  Administered 2017-07-20: 40 meq via ORAL

## 2017-07-20 MED ORDER — DEXAMETHASONE SODIUM PHOSPHATE 10 MG/ML IJ SOLN
10.0000 mg | Freq: Once | INTRAMUSCULAR | Status: AC
  Administered 2017-07-20: 10 mg via INTRAVENOUS

## 2017-07-20 NOTE — Patient Instructions (Addendum)
[facility] Discharge Instructions for Patients Receiving Chemotherapy   Beginning January 23rd 2017 lab work for the Cancer Center will be done in the  Main lab at [facility] on 1st floor. If you have a lab appointment with the Cancer Center please come in thru the  Main Entrance and check in at the main information desk   Today you received the following chemotherapy agents Taxol. Follow-up as scheduled. Call clinic for any questions or concerns  To help prevent nausea and vomiting after your treatment, we encourage you to take your nausea medication   If you develop nausea and vomiting, or diarrhea that is not controlled by your medication, call the clinic.  The clinic phone number is [phone]. Office hours are Monday-Friday 8:30am-5:00pm.  BELOW ARE SYMPTOMS THAT SHOULD BE REPORTED IMMEDIATELY:  *FEVER GREATER THAN 101.0 F  *CHILLS WITH OR WITHOUT FEVER  NAUSEA AND VOMITING THAT IS NOT CONTROLLED WITH YOUR NAUSEA MEDICATION  *UNUSUAL SHORTNESS OF BREATH  *UNUSUAL BRUISING OR BLEEDING  TENDERNESS IN MOUTH AND THROAT WITH OR WITHOUT PRESENCE OF ULCERS  *URINARY PROBLEMS  *BOWEL PROBLEMS  UNUSUAL RASH Items with * indicate a potential emergency and should be followed up as soon as possible. If you have an emergency after office hours please contact your primary care physician or go to the nearest emergency department.  Please call the clinic during office hours if you have any questions or concerns.   You may also contact the Patient Navigator at [phone] should you have any questions or need assistance in obtaining follow up care.      Resources For Cancer Patients and their Caregivers ? American Cancer Society: Can assist with transportation, wigs, general needs, runs Look Good Feel Better.        1-888-227-6333 ? Cancer Care: Provides financial assistance, online support groups, medication/co-pay assistance.  1-800-813-HOPE  (4673) ? [facility] Assists [place] cancer patients and their families through emotional , educational and financial support.  336-427-4357 ? [place] DSS Where to apply for food stamps, Medicaid and utility assistance. 336-342-1394 ? [facility]: Transportation to medical appointments. 336-347-2287 ? Social Security Administration: May apply for disability if have a Stage IV cancer. 336-342-7796 1-800-772-1213 ? [place] Aging, Disability and Transit Services: Assists with nutrition, care and transit needs. 336-349-2343         

## 2017-07-20 NOTE — Progress Notes (Signed)
Maxwell Henderson tolerated Taxol infusion well without complaints or incident. Labs and B/P reviewed with Dr. Talbert Cage prior to administering this medication and pt approved for tx per MD Pt given 40 meq PO Potassium per MD today.VSS upon discharge. Pt discharged self ambulatory in satisfactory condition

## 2017-07-20 NOTE — Telephone Encounter (Signed)
FAXED DOES TRACKING/ADMIN FORM TO [facility] FOR REIMBURSEMENT.

## 2017-07-27 ENCOUNTER — Encounter (HOSPITAL_COMMUNITY): Payer: Self-pay

## 2017-07-27 ENCOUNTER — Encounter (HOSPITAL_BASED_OUTPATIENT_CLINIC_OR_DEPARTMENT_OTHER): Payer: Self-pay | Admitting: Oncology

## 2017-07-27 ENCOUNTER — Other Ambulatory Visit: Payer: Self-pay

## 2017-07-27 ENCOUNTER — Ambulatory Visit (HOSPITAL_COMMUNITY): Payer: Self-pay

## 2017-07-27 ENCOUNTER — Encounter (HOSPITAL_BASED_OUTPATIENT_CLINIC_OR_DEPARTMENT_OTHER): Payer: Self-pay

## 2017-07-27 VITALS — BP 135/98 | HR 70 | Temp 97.9°F | Resp 18 | Ht 73.0 in | Wt 217.0 lb

## 2017-07-27 VITALS — BP 161/100 | HR 70 | Temp 97.8°F | Resp 18

## 2017-07-27 DIAGNOSIS — C770 Secondary and unspecified malignant neoplasm of lymph nodes of head, face and neck: Secondary | ICD-10-CM

## 2017-07-27 DIAGNOSIS — Z5111 Encounter for antineoplastic chemotherapy: Secondary | ICD-10-CM

## 2017-07-27 DIAGNOSIS — C160 Malignant neoplasm of cardia: Secondary | ICD-10-CM

## 2017-07-27 DIAGNOSIS — C159 Malignant neoplasm of esophagus, unspecified: Secondary | ICD-10-CM

## 2017-07-27 DIAGNOSIS — D509 Iron deficiency anemia, unspecified: Secondary | ICD-10-CM

## 2017-07-27 DIAGNOSIS — G629 Polyneuropathy, unspecified: Secondary | ICD-10-CM

## 2017-07-27 DIAGNOSIS — C155 Malignant neoplasm of lower third of esophagus: Secondary | ICD-10-CM

## 2017-07-27 DIAGNOSIS — Z5112 Encounter for antineoplastic immunotherapy: Secondary | ICD-10-CM

## 2017-07-27 LAB — CBC WITH DIFFERENTIAL/PLATELET
BASOS ABS: 0 10*3/uL (ref 0.0–0.1)
BASOS PCT: 0 %
EOS ABS: 0.1 10*3/uL (ref 0.0–0.7)
Eosinophils Relative: 2 %
HCT: 37.5 % — ABNORMAL LOW (ref 39.0–52.0)
HEMOGLOBIN: 12.3 g/dL — AB (ref 13.0–17.0)
Lymphocytes Relative: 38 %
Lymphs Abs: 1.8 10*3/uL (ref 0.7–4.0)
MCH: 27.1 pg (ref 26.0–34.0)
MCHC: 32.8 g/dL (ref 30.0–36.0)
MCV: 82.6 fL (ref 78.0–100.0)
MONO ABS: 0.2 10*3/uL (ref 0.1–1.0)
MONOS PCT: 3 %
NEUTROS PCT: 57 %
Neutro Abs: 2.8 10*3/uL (ref 1.7–7.7)
Platelets: 244 10*3/uL (ref 150–400)
RBC: 4.54 MIL/uL (ref 4.22–5.81)
RDW: 14.6 % (ref 11.5–15.5)
WBC: 4.9 10*3/uL (ref 4.0–10.5)

## 2017-07-27 LAB — URINALYSIS, DIPSTICK ONLY
BILIRUBIN URINE: NEGATIVE
Glucose, UA: NEGATIVE mg/dL
Hgb urine dipstick: NEGATIVE
KETONES UR: NEGATIVE mg/dL
Leukocytes, UA: NEGATIVE
NITRITE: NEGATIVE
Protein, ur: NEGATIVE mg/dL
Specific Gravity, Urine: 1.011 (ref 1.005–1.030)
pH: 5 (ref 5.0–8.0)

## 2017-07-27 LAB — COMPREHENSIVE METABOLIC PANEL
ALK PHOS: 54 U/L (ref 38–126)
ALT: 11 U/L — AB (ref 17–63)
AST: 16 U/L (ref 15–41)
Albumin: 3.8 g/dL (ref 3.5–5.0)
Anion gap: 7 (ref 5–15)
BILIRUBIN TOTAL: 0.6 mg/dL (ref 0.3–1.2)
BUN: 5 mg/dL — ABNORMAL LOW (ref 6–20)
CALCIUM: 8.7 mg/dL — AB (ref 8.9–10.3)
CO2: 25 mmol/L (ref 22–32)
CREATININE: 0.74 mg/dL (ref 0.61–1.24)
Chloride: 106 mmol/L (ref 101–111)
GFR calc non Af Amer: >60 mL/min (ref >60)
Glucose, Bld: 111 mg/dL — ABNORMAL HIGH (ref 65–99)
Potassium: 3.7 mmol/L (ref 3.5–5.1)
SODIUM: 138 mmol/L (ref 135–145)
TOTAL PROTEIN: 6.3 g/dL — AB (ref 6.5–8.1)

## 2017-07-27 MED ORDER — SODIUM CHLORIDE 0.9 % IV SOLN: 10.0000 mg | Freq: Once | INTRAVENOUS | Status: DC

## 2017-07-27 MED ORDER — SODIUM CHLORIDE 0.9% FLUSH
10.0000 mL | INTRAVENOUS | Status: DC | PRN
  Administered 2017-07-27: 10 mL
  Filled 2017-07-27: qty 10

## 2017-07-27 MED ORDER — DEXTROSE 5 % IV SOLN
80.0000 mg/m2 | Freq: Once | INTRAVENOUS | Status: AC
  Administered 2017-07-27: 174 mg via INTRAVENOUS
  Filled 2017-07-27: qty 29

## 2017-07-27 MED ORDER — FAMOTIDINE IN NACL 20-0.9 MG/50ML-% IV SOLN
20.0000 mg | Freq: Once | INTRAVENOUS | Status: AC
  Administered 2017-07-27: 20 mg via INTRAVENOUS
  Filled 2017-07-27: qty 50

## 2017-07-27 MED ORDER — ACETAMINOPHEN 325 MG PO TABS
650.0000 mg | ORAL_TABLET | Freq: Once | ORAL | Status: AC
  Administered 2017-07-27: 650 mg via ORAL
  Filled 2017-07-27: qty 2

## 2017-07-27 MED ORDER — RAMUCIRUMAB CHEMO INJECTION 500 MG/50ML
8.0000 mg/kg | Freq: Once | INTRAVENOUS | Status: AC
  Administered 2017-07-27: 800 mg via INTRAVENOUS
  Filled 2017-07-27: qty 50

## 2017-07-27 MED ORDER — SODIUM CHLORIDE 0.9 % IV SOLN
Freq: Once | INTRAVENOUS | Status: AC
  Administered 2017-07-27: 09:00:00 via INTRAVENOUS

## 2017-07-27 MED ORDER — DIPHENHYDRAMINE HCL 50 MG/ML IJ SOLN
50.0000 mg | Freq: Once | INTRAMUSCULAR | Status: AC
  Administered 2017-07-27: 50 mg via INTRAVENOUS
  Filled 2017-07-27: qty 1

## 2017-07-27 MED ORDER — DEXAMETHASONE SODIUM PHOSPHATE 10 MG/ML IJ SOLN
10.0000 mg | Freq: Once | INTRAMUSCULAR | Status: AC
  Administered 2017-07-27: 10 mg via INTRAVENOUS

## 2017-07-27 MED ORDER — DEXAMETHASONE SODIUM PHOSPHATE 10 MG/ML IJ SOLN
INTRAMUSCULAR | Status: AC
  Filled 2017-07-27: qty 1

## 2017-07-27 MED ORDER — HEPARIN SOD (PORK) LOCK FLUSH 100 UNIT/ML IV SOLN
500.0000 [IU] | Freq: Once | INTRAVENOUS | Status: AC | PRN
  Administered 2017-07-27: 500 [IU]
  Filled 2017-07-27: qty 5

## 2017-07-27 MED ORDER — GABAPENTIN 300 MG PO CAPS: ORAL_CAPSULE | ORAL | 3 refills | Status: DC

## 2017-07-27 NOTE — Patient Instructions (Signed)
[facility] Discharge Instructions for Patients Receiving Chemotherapy   Beginning January 23rd 2017 lab work for the [facility] will be done in the  Main lab at [facility] on 1st floor. If you have a lab appointment with the [facility] please come in thru the  Main Entrance and check in at the main information desk   Today you received the following chemotherapy agents Cyramza and Taxol. Follow-up as scheduled. Call clinic for any questions or concerns  To help prevent nausea and vomiting after your treatment, we encourage you to take your nausea medication   If you develop nausea and vomiting, or diarrhea that is not controlled by your medication, call the clinic.  The clinic phone number is [phone]. Office hours are Monday-Friday 8:30am-5:00pm.  BELOW ARE SYMPTOMS THAT SHOULD BE REPORTED IMMEDIATELY:  *FEVER GREATER THAN 101.0 F  *CHILLS WITH OR WITHOUT FEVER  NAUSEA AND VOMITING THAT IS NOT CONTROLLED WITH YOUR NAUSEA MEDICATION  *UNUSUAL SHORTNESS OF BREATH  *UNUSUAL BRUISING OR BLEEDING  TENDERNESS IN MOUTH AND THROAT WITH OR WITHOUT PRESENCE OF ULCERS  *URINARY PROBLEMS  *BOWEL PROBLEMS  UNUSUAL RASH Items with * indicate a potential emergency and should be followed up as soon as possible. If you have an emergency after office hours please contact your primary care physician or go to the nearest emergency department.  Please call the clinic during office hours if you have any questions or concerns.   You may also contact the Patient Navigator at [phone] should you have any questions or need assistance in obtaining follow up care.      Resources For Cancer Patients and their Caregivers ? American Cancer Society: Can assist with transportation, wigs, general needs, runs Look Good Feel Better.        2506662934 ? Cancer Care: Provides financial assistance, online support groups, medication/co-pay assistance.   1-800-813-HOPE 5100899375) ? [facility] Assists [place] cancer patients and their families through emotional , educational and financial support.  309-188-3668 ? [place] DSS Where to apply for food stamps, Medicaid and utility assistance. (269) 555-5401 ? [facility]: Transportation to medical appointments. (917)374-1788 ? Social Security Administration: May apply for disability if have a Stage IV cancer. 512-526-1388 719-227-5096 ? [employer], Disability and Transit Services: Assists with nutrition, care and transit needs. 807-720-6551

## 2017-07-27 NOTE — Progress Notes (Signed)
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 Barney Drain. Partially obstructing, likely malignant esophageal tumor was found in the distal esophagus. Biopsied with surgical path demonstrating Adenocarcinoma. Injected. Treated with argon plasma coagulation (APC) #3. 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 Biopsied with surgical path demonstrating Adenocarcinoma.      01/19/2017 PET scan    NECK Two FDG avid nodes are seen in the base of the neck on the right. The first is seen on CT image 35, just posterior to the right thyroid lobe and the other is seen on CT image 41 measuring 16 mm in short axis with a maximum SUV of 8.6.  CHEST  The patient's known malignancy involving the distal esophagus and cardia of the stomach is FDG avid with a maximum SUV of 10.4. No definitive metastatic nodes within the chest.  ABDOMEN/PELVIS  FDG avid metastatic adenopathy is seen in the gastrohepatic ligament, paraceliac nodes, posterior to the IVC on image 111, and to the left of the SMA on image 112. The most inferior node is seen in the aortocaval region on image 117. The representative node to the left of the SMA was measured on series 4, image 111 measuring 2.5 by 2.1 cm with a maximum SUV of 7. No other FDG avid disease in the abdomen or pelvis. Cholelithiasis is identified.      02/02/2017 Procedure    US guided bx of right supraclavicular LN by IR.      02/05/2017 Pathology Results     Lymph node, needle/core biopsy, Right Supraclavicular - METASTATIC POORLY DIFFERENTIATED CARCINOMA.      02/10/2017 Pathology Results    HER2 - NEGATIVE      02/13/2017 Procedure    Port placed by IR      02/15/2017 - 04/25/2017 Chemotherapy    FOLFOX x 6 cycles       05/02/2017 Imaging    CT C/A/P: IMPRESSION: 1. Nodular thickening and hyperenhancement in the distal esophagus and gastric cardia could reflect residual tumor. 2. No significant change in size of previously demonstrated hypermetabolic upper abdominal lymph nodes. No progressive adenopathy identified. No residual enlarged supraclavicular nodes are seen. 3. No evidence of progressive metastatic disease. No worrisome hepatic findings. 4. Tiny right upper lobe pulmonary nodule, not clearly seen on low dose previous PET-CT. Attention on follow-up recommended. 5. Cholelithiasis.        INTERVAL HISTORY:  Patient presents today for continued follow-up and cycle 3-day 15 of chemotherapy with paclitaxel and ramucirumab.  He states that overall he has been doing well.  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  However he states that he can still ambulate without any trouble.  His appetite and energy level are at 100%.  He denies any chest pain, shortness of breath, abdominal pain, nausea, vomiting, diarrhea, focal weakness.  Patient states that he has gained 9 pounds.  REVIEW OF SYSTEMS:  Review of Systems  Constitutional: Negative for chills, fatigue and fever.  HENT:  Negative.  Negative for lump/mass and nosebleeds.   Eyes: Negative.   Respiratory: Negative.  Negative for cough and shortness of breath.   Cardiovascular: Negative.  Negative for chest pain and leg swelling.  Gastrointestinal: Negative.  Negative for abdominal pain, blood in stool, constipation, diarrhea, nausea and vomiting.  Endocrine: Negative.   Genitourinary: Negative.  Negative for dysuria and hematuria.   Musculoskeletal: Negative.  Negative  for arthralgias.  Skin: Negative.  Negative for rash.  Neurological: Positive for numbness (mild neuropathy in toes). Negative for dizziness and headaches.  Hematological: Negative.  Negative for adenopathy. Does not bruise/bleed easily.  Psychiatric/Behavioral: Negative.  Negative for depression and sleep disturbance. The patient is not nervous/anxious.      PAST MEDICAL/SURGICAL HISTORY:  Past Medical History:  Diagnosis Date  . Diabetes mellitus without complication ([place])   . Hypertension   . 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 ESOPHAGOGASTRODUODENOSCOPY (EGD) WITH PROPOFOL N/A 01/16/2017   Procedure: ESOPHAGOGASTRODUODENOSCOPY (EGD) WITH PROPOFOL;  Surgeon: [name], MD;  Location: [facility];  Service: Endoscopy;  Laterality: N/A;  730   . IR FLUORO GUIDE PORT INSERTION RIGHT  02/13/2017  . IR US GUIDE VASC ACCESS RIGHT  02/13/2017  . lipoma removal    . PORTA CATH INSERTION Right 02/13/2017  . SAVORY DILATION N/A 01/16/2017   Procedure: SAVORY DILATION;  Surgeon: [name], MD;  Location: [facility] SUITE;  Service: Endoscopy;  Laterality: N/A;     SOCIAL HISTORY:  Social History   Socioeconomic History  . Marital status: Married    Spouse name: Not on file  . Number of children: Not on file  . Years of education: Not on file  . Highest education level: Not on file  Social Needs  . Financial resource strain: Not on file  . Food insecurity - worry: Not on file  . Food insecurity - inability: Not on file  . Transportation needs - medical: Not on file  . Transportation needs - non-medical: Not on file  Occupational History  . Not on file  Tobacco Use  . Smoking status: Former Smoker    Packs/day: 0.25    Types: Cigarettes  . Smokeless tobacco: Never Used  Substance and Sexual Activity  . Alcohol use: No    Comment: hx heavy  alcohol, hasn't drank in 19 yrs  . Drug use: Yes    Types: Marijuana    Comment: history of marijuana   . Sexual activity: Yes  Other Topics Concern  . Not on file  Social History Narrative   WORKING [employer].    FAMILY HISTORY:  Family History  Problem Relation Age of Onset  . Prostate cancer Father   . Colon cancer Neg Hx   . Colon polyps Neg Hx     CURRENT MEDICATIONS:  Outpatient Encounter Medications as of 07/27/2017  Medication Sig  . dexamethasone (DECADRON) 4 MG tablet Take 2 tablets (8 mg total) by mouth daily. Start the day after chemotherapy for 2 days. Take with food.  . Diphenhyd-Hydrocort-Nystatin (FIRST-DUKES MOUTHWASH) SUSP Use as directed 5 mLs in the mouth or throat 4 (four) times daily as needed.  . loratadine (CLARITIN) 10 MG tablet Take 10 mg by mouth daily.  Marland Kitchen losartan (COZAAR) 50 MG tablet Take 1 tablet (50 mg total) daily by mouth.  . metFORMIN (GLUCOPHAGE) 500 MG tablet  Take 500 mg by mouth daily.  . Misc. Devices MISC Please provide the patient with the [facility] HPB cough Syrup. 1 teaspoonful every 4-6 hours PRN. Please provide the patient with 360 ml.  . Morphine Sulfate (MORPHINE CONCENTRATE) 10 mg / 0.5 ml concentrated solution Take 0.5 mLs (10 mg total) by mouth every 4 (four) hours as needed for severe pain.  Marland Kitchen PACLitaxel (TAXOL IV) Inject into the vein.  . pantoprazole (PROTONIX) 40 MG tablet Take 1 tablet (40 mg total) by mouth 2 (two) times daily. Take 30 minutes before breakfast  . polyethylene glycol powder (GLYCOLAX/MIRALAX) powder Take 1 capful daily.  . prochlorperazine (COMPAZINE) 10 MG tablet TAKE 1 TABLET BY MOUTH EVERY 6 HOURS AS NEEDED FOR NAUSEA AND VOMITING  . Ramucirumab (CYRAMZA IV) Inject into the vein.  . ranitidine (ZANTAC) 300 MG tablet Take 1 tablet (300 mg total) by mouth at bedtime.  . temazepam (RESTORIL) 30 MG capsule Take 1 capsule (30 mg total) by mouth at bedtime as needed for sleep.  . colchicine  0.6 MG tablet Take 1 tablet (0.6 mg total) by mouth daily. (Patient not taking: Reported on 07/18/2017)  . gabapentin (NEURONTIN) 300 MG capsule Take 1 cap PO qHS for 7 days, then if tolerated increase to 1 cap PO twice a day.   No facility-administered encounter medications on file as of 07/27/2017.     ALLERGIES:  No Known Allergies   PHYSICAL EXAM:  ECOG Performance status: 1 - Symptomatic; remains independent   Bp 135/98 P 70 RR 18 T 97.9 O2 sat 100% Wt 217 lbs   Physical Exam  Constitutional: He is oriented to person, place, and time and well-developed, well-nourished, and in no distress.  HENT:  Head: Normocephalic.  Mouth/Throat: Oropharynx is clear and moist.  Eyes: Conjunctivae are normal. No scleral icterus.  Neck: Normal range of motion. Neck supple.  Cardiovascular: Normal rate and regular rhythm.  Pulmonary/Chest: Effort normal and breath sounds normal. No respiratory distress.  Abdominal: Soft. Bowel sounds are normal. There is no tenderness.  Musculoskeletal: Normal range of motion. He exhibits no edema.  Lymphadenopathy:    He has no cervical adenopathy.       Right: No supraclavicular adenopathy present.       Left: No supraclavicular adenopathy present.  Neurological: He is alert and oriented to person, place, and time. No cranial nerve deficit.  Skin: Skin is warm and dry. No rash noted.  Psychiatric: Mood, memory, affect and judgment normal.  Nursing note and vitals reviewed.    LABORATORY DATA:  I have reviewed the labs as listed.  CBC    Component Value Date/Time   WBC 4.9 07/27/2017 0815   RBC 4.54 07/27/2017 0815   HGB 12.3 (L) 07/27/2017 0815   HCT 37.5 (L) 07/27/2017 0815   PLT 244 07/27/2017 0815   MCV 82.6 07/27/2017 0815   MCH 27.1 07/27/2017 0815   MCHC 32.8 07/27/2017 0815   RDW 14.6 07/27/2017 0815   LYMPHSABS 1.8 07/27/2017 0815   MONOABS 0.2 07/27/2017 0815   EOSABS 0.1 07/27/2017 0815   BASOSABS 0.0 07/27/2017 0815   CMP  Latest Ref Rng & Units 07/27/2017 07/20/2017 07/13/2017  Glucose 65 - 99 mg/dL 111(H) 111(H) 84  BUN 6 - 20 mg/dL 5(L) 7 9  Creatinine 0.61 - 1.24 mg/dL 0.74 0.78 0.87  Sodium 135 - 145 mmol/L 138 136 138  Potassium 3.5 - 5.1 mmol/L 3.7 3.2(L) 3.4(L)  Chloride 101 - 111 mmol/L 106 104 104  CO2 22 - 32 mmol/L _0 Calcium 8.9 - 10.3 mg/dL 8.7(L) 9.2 9.1  Total Protein 6.5 - 8.1 g/dL 6.3(L) 7.4 7.5  Total Bilirubin 0.3 - 1.2 mg/dL 0.6 0.9 0.7  Alkaline Phos 38 - 126 U/L 54 55 61  AST 15 - 41 U/L _1 ALT 17 - 63 U/L 11(L) 13(L) 17    PENDING LABS:    DIAGNOSTIC IMAGING:  *The following radiologic images and reports have been reviewed independently and agree with below findings.  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 Oneida Alar.  PET scan 01/19/17 revealed primary malignancy involving distal esophagus and gastric cardia, with nodes at base of right neck most consistent with metastatic disease; also metastatic nodes in upper abdomen. Underwent (R) supraclavicular lymph node biopsy on 01/16/17 revealing metastatic poorly differentiated carcinoma; HER2-.  He did not undergo EUS prior to treatment.  Started systemic treatment with FOLFOX chemotherapy on 02/15/17.  -Patient has completed 6 cycles of FOLFOX to date. His repeat CT C/A/P demonstrated partial response with resolution of his right supraclavicular lymphadenopathy, however he still has residual thickening/hyperenhancement in the distal esophagus/gastric cardia and no change in his abdominal lymphadenopathy. -Continue cyramza with paclitaxel cycle 3 day 15 today. Labs reviewed. Continue chemo as scheduled.  -Plan to restage with repeat CT C/A/P 1-2 days prior to cycle 4 of chemo.   Iron  deficiency anemia:  -Likely from esophageal malignancy/ulceration.  -s/p 2 doses of Feraheme on 03/02/17 & 03/09/17 for low ferritin of 9 . -Received 1 dose of feraheme on 04/25/17.  -Hemoglobin is 12.3 g/dl today.   Dispo:  -RTC for follow up with cycle 4 day 1 of chemo and to review CT scan results.    All questions were answered to patient's stated satisfaction. Encouraged patient to call with any new concerns or questions before his next visit to the cancer center and we can certain see him sooner, if needed.    Twana First, MD

## 2017-07-27 NOTE — Progress Notes (Signed)
1062 Lab and urine results reviewed with Dr. Talbert Cage and pt approved for chemo tx today per MD                 Maxwell Henderson tolerated chemotherapy tx well without complaints or incident. VSS upon discharge.MD aware of  B/P. Pt discharged self ambulatory in satisfactory condition

## 2017-08-08 ENCOUNTER — Ambulatory Visit (HOSPITAL_COMMUNITY)
Admission: RE | Admit: 2017-08-08 | Discharge: 2017-08-08 | Disposition: A | Discharge status: Home / Self Care | Payer: Self-pay | Source: Ambulatory Visit | Attending: Oncology | Admitting: Oncology

## 2017-08-08 DIAGNOSIS — C159 Malignant neoplasm of esophagus, unspecified: Secondary | ICD-10-CM | POA: Insufficient documentation

## 2017-08-08 DIAGNOSIS — K8020 Calculus of gallbladder without cholecystitis without obstruction: Secondary | ICD-10-CM | POA: Insufficient documentation

## 2017-08-08 DIAGNOSIS — N200 Calculus of kidney: Secondary | ICD-10-CM | POA: Insufficient documentation

## 2017-08-08 MED ORDER — IOPAMIDOL (ISOVUE-300) INJECTION 61%
100.0000 mL | Freq: Once | INTRAVENOUS | Status: AC | PRN
  Administered 2017-08-08: 100 mL via INTRAVENOUS

## 2017-08-10 ENCOUNTER — Encounter (HOSPITAL_COMMUNITY): Payer: Self-pay

## 2017-08-10 ENCOUNTER — Encounter (HOSPITAL_COMMUNITY): Payer: Self-pay | Attending: Adult Health | Admitting: Adult Health

## 2017-08-10 ENCOUNTER — Other Ambulatory Visit: Payer: Self-pay

## 2017-08-10 ENCOUNTER — Encounter (HOSPITAL_COMMUNITY): Payer: Self-pay | Admitting: Adult Health

## 2017-08-10 ENCOUNTER — Encounter: Payer: Self-pay | Admitting: Oncology

## 2017-08-10 ENCOUNTER — Encounter (HOSPITAL_BASED_OUTPATIENT_CLINIC_OR_DEPARTMENT_OTHER): Payer: Self-pay

## 2017-08-10 VITALS — BP 147/94 | HR 74 | Temp 97.7°F | Resp 18 | Wt 215.4 lb

## 2017-08-10 DIAGNOSIS — C159 Malignant neoplasm of esophagus, unspecified: Secondary | ICD-10-CM

## 2017-08-10 DIAGNOSIS — C160 Malignant neoplasm of cardia: Secondary | ICD-10-CM

## 2017-08-10 DIAGNOSIS — I158 Other secondary hypertension: Secondary | ICD-10-CM

## 2017-08-10 DIAGNOSIS — I10 Essential (primary) hypertension: Secondary | ICD-10-CM

## 2017-08-10 DIAGNOSIS — C770 Secondary and unspecified malignant neoplasm of lymph nodes of head, face and neck: Secondary | ICD-10-CM

## 2017-08-10 DIAGNOSIS — C155 Malignant neoplasm of lower third of esophagus: Secondary | ICD-10-CM

## 2017-08-10 DIAGNOSIS — Z5111 Encounter for antineoplastic chemotherapy: Secondary | ICD-10-CM

## 2017-08-10 DIAGNOSIS — G620 Drug-induced polyneuropathy: Secondary | ICD-10-CM

## 2017-08-10 DIAGNOSIS — D509 Iron deficiency anemia, unspecified: Secondary | ICD-10-CM

## 2017-08-10 DIAGNOSIS — Z5112 Encounter for antineoplastic immunotherapy: Secondary | ICD-10-CM

## 2017-08-10 LAB — CBC WITH DIFFERENTIAL/PLATELET
BASOS PCT: 0 %
Basophils Absolute: 0 10*3/uL (ref 0.0–0.1)
EOS ABS: 0.1 10*3/uL (ref 0.0–0.7)
EOS PCT: 1 %
HCT: 40.1 % (ref 39.0–52.0)
HEMOGLOBIN: 13.3 g/dL (ref 13.0–17.0)
Lymphocytes Relative: 31 %
Lymphs Abs: 2 10*3/uL (ref 0.7–4.0)
MCH: 27 pg (ref 26.0–34.0)
MCHC: 33.2 g/dL (ref 30.0–36.0)
MCV: 81.3 fL (ref 78.0–100.0)
MONO ABS: 0.6 10*3/uL (ref 0.1–1.0)
MONOS PCT: 9 %
NEUTROS PCT: 59 %
Neutro Abs: 4 10*3/uL (ref 1.7–7.7)
PLATELETS: 247 10*3/uL (ref 150–400)
RBC: 4.93 MIL/uL (ref 4.22–5.81)
RDW: 15 % (ref 11.5–15.5)
WBC: 6.7 10*3/uL (ref 4.0–10.5)

## 2017-08-10 LAB — URINALYSIS, DIPSTICK ONLY
BILIRUBIN URINE: NEGATIVE
Glucose, UA: NEGATIVE mg/dL
HGB URINE DIPSTICK: NEGATIVE
KETONES UR: NEGATIVE mg/dL
Leukocytes, UA: NEGATIVE
NITRITE: NEGATIVE
PROTEIN: 30 mg/dL — AB
Specific Gravity, Urine: 1.023 (ref 1.005–1.030)
pH: 5 (ref 5.0–8.0)

## 2017-08-10 LAB — COMPREHENSIVE METABOLIC PANEL
ALK PHOS: 57 U/L (ref 38–126)
ALT: 20 U/L (ref 17–63)
AST: 24 U/L (ref 15–41)
Albumin: 4.5 g/dL (ref 3.5–5.0)
Anion gap: 12 (ref 5–15)
BUN: 14 mg/dL (ref 6–20)
CALCIUM: 9.4 mg/dL (ref 8.9–10.3)
CO2: 22 mmol/L (ref 22–32)
CREATININE: 0.98 mg/dL (ref 0.61–1.24)
Chloride: 104 mmol/L (ref 101–111)
Glucose, Bld: 119 mg/dL — ABNORMAL HIGH (ref 65–99)
Potassium: 3.7 mmol/L (ref 3.5–5.1)
SODIUM: 138 mmol/L (ref 135–145)
Total Bilirubin: 0.7 mg/dL (ref 0.3–1.2)
Total Protein: 7.9 g/dL (ref 6.5–8.1)

## 2017-08-10 MED ORDER — RAMUCIRUMAB CHEMO INJECTION 500 MG/50ML
8.0000 mg/kg | Freq: Once | INTRAVENOUS | Status: AC
  Administered 2017-08-10: 800 mg via INTRAVENOUS
  Filled 2017-08-10: qty 50

## 2017-08-10 MED ORDER — DIPHENHYDRAMINE HCL 50 MG/ML IJ SOLN
50.0000 mg | Freq: Once | INTRAMUSCULAR | Status: AC
  Administered 2017-08-10: 50 mg via INTRAVENOUS

## 2017-08-10 MED ORDER — PACLITAXEL CHEMO INJECTION 300 MG/50ML
80.0000 mg/m2 | Freq: Once | INTRAVENOUS | Status: AC
  Administered 2017-08-10: 174 mg via INTRAVENOUS
  Filled 2017-08-10: qty 29

## 2017-08-10 MED ORDER — ACETAMINOPHEN 325 MG PO TABS
650.0000 mg | ORAL_TABLET | Freq: Once | ORAL | Status: AC
  Administered 2017-08-10: 650 mg via ORAL

## 2017-08-10 MED ORDER — SODIUM CHLORIDE 0.9% FLUSH
10.0000 mL | INTRAVENOUS | Status: DC | PRN
  Administered 2017-08-10: 10 mL
  Filled 2017-08-10: qty 10

## 2017-08-10 MED ORDER — HYDROCHLOROTHIAZIDE 12.5 MG PO CAPS: 12.5000 mg | ORAL_CAPSULE | Freq: Every day | ORAL | 0 refills | Status: DC

## 2017-08-10 MED ORDER — DEXAMETHASONE SODIUM PHOSPHATE 10 MG/ML IJ SOLN
INTRAMUSCULAR | Status: AC
  Filled 2017-08-10: qty 1

## 2017-08-10 MED ORDER — SODIUM CHLORIDE 0.9 % IV SOLN
Freq: Once | INTRAVENOUS | Status: AC
  Administered 2017-08-10: 10:00:00 via INTRAVENOUS

## 2017-08-10 MED ORDER — DEXAMETHASONE SODIUM PHOSPHATE 10 MG/ML IJ SOLN
10.0000 mg | Freq: Once | INTRAMUSCULAR | Status: AC
  Administered 2017-08-10: 10 mg via INTRAVENOUS

## 2017-08-10 MED ORDER — DIPHENHYDRAMINE HCL 50 MG/ML IJ SOLN
INTRAMUSCULAR | Status: AC
  Filled 2017-08-10: qty 1

## 2017-08-10 MED ORDER — ACETAMINOPHEN 325 MG PO TABS
ORAL_TABLET | ORAL | Status: AC
  Filled 2017-08-10: qty 2

## 2017-08-10 MED ORDER — FAMOTIDINE IN NACL 20-0.9 MG/50ML-% IV SOLN
20.0000 mg | Freq: Once | INTRAVENOUS | Status: AC
  Administered 2017-08-10: 20 mg via INTRAVENOUS

## 2017-08-10 MED ORDER — FAMOTIDINE IN NACL 20-0.9 MG/50ML-% IV SOLN
INTRAVENOUS | Status: AC
  Filled 2017-08-10: qty 50

## 2017-08-10 MED ORDER — SODIUM CHLORIDE 0.9 % IV SOLN: 10.0000 mg | Freq: Once | INTRAVENOUS | Status: DC

## 2017-08-10 MED ORDER — HEPARIN SOD (PORK) LOCK FLUSH 100 UNIT/ML IV SOLN
500.0000 [IU] | Freq: Once | INTRAVENOUS | Status: AC | PRN
  Administered 2017-08-10: 500 [IU]

## 2017-08-10 NOTE — Progress Notes (Signed)
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 Barney Drain. Partially obstructing, likely malignant esophageal tumor was found in the distal esophagus. Biopsied with surgical path demonstrating Adenocarcinoma. Injected. Treated with argon plasma coagulation (APC) #3. 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 Biopsied with surgical path demonstrating Adenocarcinoma.      01/19/2017 PET scan    NECK Two FDG avid nodes are seen in the base of the neck on the right. The first is seen on CT image 35, just posterior to the right thyroid lobe and the other is seen on CT image 41 measuring 16 mm in short axis with a maximum SUV of 8.6.  CHEST  The patient's known malignancy involving the distal esophagus and cardia of the stomach is FDG avid with a maximum SUV of 10.4. No definitive metastatic nodes within the chest.  ABDOMEN/PELVIS  FDG avid metastatic adenopathy is seen in the gastrohepatic ligament, paraceliac nodes, posterior to the IVC on image 111, and to the left of the SMA on image 112. The most inferior node is seen in the aortocaval region on image 117. The representative node to the left of the SMA was measured on series 4, image 111 measuring 2.5 by 2.1 cm with a maximum SUV of 7. No other FDG avid disease in the abdomen or pelvis. Cholelithiasis is identified.      02/02/2017 Procedure    US guided bx of right supraclavicular LN by IR.      02/05/2017 Pathology Results      Lymph node, needle/core biopsy, Right Supraclavicular - METASTATIC POORLY DIFFERENTIATED CARCINOMA.      02/10/2017 Pathology Results    HER2 - NEGATIVE      02/13/2017 Procedure    Port placed by IR      02/15/2017 - 04/25/2017 Chemotherapy    FOLFOX x 6 cycles       05/02/2017 Imaging    CT C/A/P: IMPRESSION: 1. Nodular thickening and hyperenhancement in the distal esophagus and gastric cardia could reflect residual tumor. 2. No significant change in size of previously demonstrated hypermetabolic upper abdominal lymph nodes. No progressive adenopathy identified. No residual enlarged supraclavicular nodes are seen. 3. No evidence of progressive metastatic disease. No worrisome hepatic findings. 4. Tiny right upper lobe pulmonary nodule, not clearly seen on low dose previous PET-CT. Attention on follow-up recommended. 5. Cholelithiasis.        INTERVAL HISTORY:  [name] 51 y.o. male returns for routine follow-up for esophageal cancer.   Here today with his wife. Due for next cycle of Taxol/Cyramza today.   Overall, he tells me he has been feeling very well.  Appetite and energy levels both 100%.  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  He does have left great toe pain, which he attributes to an ingrown toenail.  He plans to follow-up with his family doctor regarding this issue.  Denies any drainage, redness, or signs of infection to the left great  toe.  Denies any fever/chills.  Denies any diarrhea, constipation, hematuria or dysuria.  Denies any mucositis, nausea, or vomiting.  His blood pressure is elevated 150s/100s today.  Reports that he has been taking his Cozaar 50 mg daily as prescribed.  Endorses periodic headaches.  Denies any dizziness, chest pain, palpitations, or syncopal episodes.  He recently had restaging CT scans and will like to review those results together today.    Otherwise, he is largely without  complaints today and feels ready for his next cycle of chemotherapy.      REVIEW OF SYSTEMS:  Review of Systems  Constitutional: Negative.  Negative for chills, fatigue and fever.  HENT:  Negative.  Negative for lump/mass and nosebleeds.   Eyes: Negative.   Respiratory: Negative.  Negative for cough and shortness of breath.   Cardiovascular: Negative.  Negative for chest pain and leg swelling.  Gastrointestinal: Negative.  Negative for abdominal pain, blood in stool, constipation, diarrhea, nausea and vomiting.  Endocrine: Negative.   Genitourinary: Negative.  Negative for dysuria and hematuria.   Musculoskeletal: Negative.  Negative for arthralgias.  Skin: Negative.  Negative for rash.       Left great toe pain, which she attributes to ingrown toenail.  (He plans to contact his PCP regarding this issue).  Neurological: Negative.  Negative for dizziness and headaches.  Hematological: Negative.  Negative for adenopathy. Does not bruise/bleed easily.  Psychiatric/Behavioral: Negative.  Negative for depression and sleep disturbance. The patient is not nervous/anxious.      PAST MEDICAL/SURGICAL HISTORY:  Past Medical History:  Diagnosis Date  . Diabetes mellitus without complication ([place])   . Hypertension   . 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 Primary esophageal adenocarcinoma ([place]) 01/29/2017   Past Surgical History:  Procedure Laterality Date  . ESOPHAGOGASTRODUODENOSCOPY (EGD) WITH PROPOFOL N/A 01/16/2017   Procedure: ESOPHAGOGASTRODUODENOSCOPY (EGD) WITH PROPOFOL;  Surgeon: [name], MD;  Location: [facility];  Service: Endoscopy;  Laterality: N/A;  730   . IR FLUORO GUIDE PORT INSERTION RIGHT  02/13/2017  . IR US GUIDE VASC ACCESS RIGHT  02/13/2017  . lipoma removal    . PORTA CATH INSERTION Right 02/13/2017  . SAVORY DILATION N/A 01/16/2017   Procedure: SAVORY DILATION;  Surgeon: [name], MD;  Location: [facility];  Service:  Endoscopy;  Laterality: N/A;     SOCIAL HISTORY:  Social History   Socioeconomic History  . Marital status: Married    Spouse name: Not on file  . Number of children: Not on file  . Years of education: Not on file  . Highest education level: Not on file  Social Needs  . Financial resource strain: Not on file  . Food insecurity - worry: Not on file  . Food insecurity - inability: Not on file  . Transportation needs - medical: Not on file  . Transportation needs - non-medical: Not on file  Occupational History  . Not on file  Tobacco Use  . Smoking status: Former Smoker    Packs/day: 0.25    Types: Cigarettes  . Smokeless tobacco: Never Used  Substance and Sexual Activity  . Alcohol use: No    Comment: hx heavy alcohol, hasn't drank in 19 yrs  . Drug use: Yes    Types: Marijuana    Comment: history of marijuana   . Sexual activity: Yes  Other Topics Concern  . 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    FAMILY HISTORY:  Family History  Problem Relation Age of Onset  . Prostate cancer Father   . Colon cancer Neg Hx   . Colon polyps Neg Hx     CURRENT MEDICATIONS:  Outpatient Encounter Medications as of 08/10/2017  Medication Sig  . colchicine 0.6 MG tablet Take 1 tablet (0.6 mg total) by mouth daily.  Marland Kitchen dexamethasone (DECADRON) 4 MG tablet Take 2 tablets (8 mg total) by mouth daily. Start the day after chemotherapy for 2 days. Take with food.  . gabapentin (NEURONTIN) 300 MG capsule Take 1 cap PO qHS for 7 days, then if tolerated increase to 1 cap PO twice a day.  . loratadine (CLARITIN) 10 MG tablet Take 10 mg by mouth daily.  Marland Kitchen losartan (COZAAR) 50 MG tablet Take 1 tablet (50 mg total) daily by mouth.  . metFORMIN (GLUCOPHAGE) 500 MG tablet Take 500 mg by mouth daily.  . Misc. Devices MISC Please provide the patient with the [facility] HPB cough Syrup. 1 teaspoonful every 4-6 hours PRN. Please provide the patient with 360  ml.  . Morphine Sulfate (MORPHINE CONCENTRATE) 10 mg / 0.5 ml concentrated solution Take 0.5 mLs (10 mg total) by mouth every 4 (four) hours as needed for severe pain.  Marland Kitchen PACLitaxel (TAXOL IV) Inject into the vein.  . pantoprazole (PROTONIX) 40 MG tablet Take 1 tablet (40 mg total) by mouth 2 (two) times daily. Take 30 minutes before breakfast  . polyethylene glycol powder (GLYCOLAX/MIRALAX) powder Take 1 capful daily.  . prochlorperazine (COMPAZINE) 10 MG tablet TAKE 1 TABLET BY MOUTH EVERY 6 HOURS AS NEEDED FOR NAUSEA AND VOMITING  . Ramucirumab (CYRAMZA IV) Inject into the vein.  . ranitidine (ZANTAC) 300 MG tablet Take 1 tablet (300 mg total) by mouth at bedtime.  . temazepam (RESTORIL) 30 MG capsule Take 1 capsule (30 mg total) by mouth at bedtime as needed for sleep.  . Diphenhyd-Hydrocort-Nystatin (FIRST-DUKES MOUTHWASH) SUSP Use as directed 5 mLs in the mouth or throat 4 (four) times daily as needed. (Patient not taking: Reported on 08/10/2017)  . hydrochlorothiazide (MICROZIDE) 12.5 MG capsule Take 1 capsule (12.5 mg total) by mouth daily.   No facility-administered encounter medications on file as of 08/10/2017.     ALLERGIES:  No Known Allergies   PHYSICAL EXAM:  ECOG Performance status: 0-1 - Mildly symptomatic; remains independent      Physical Exam  Constitutional: He is oriented to person, place, and time and well-developed, well-nourished, and in no distress.  Seen in chemo chair in infusion area   HENT:  Head: Normocephalic.  Mouth/Throat: Oropharynx is clear and moist.  Eyes: Conjunctivae are normal. No scleral icterus.  Neck: Normal range of motion. Neck supple.  Cardiovascular: Normal rate and regular rhythm.  Pulmonary/Chest: Effort normal and breath sounds normal. No respiratory distress.  Abdominal: Soft. Bowel sounds are normal. There is no tenderness.  Musculoskeletal: Normal range of motion. He exhibits no edema.  Lymphadenopathy:    He has no cervical  adenopathy.       Right: No supraclavicular adenopathy present.       Left: No supraclavicular adenopathy present.  Neurological: He is alert and oriented to person, place, and time. No cranial nerve deficit.  Skin: Skin is warm and dry. No rash noted.  (L) great toe with mild erythema; appears to have possible ingrown toenail. No evidence of active infection/induration or drainage.   Psychiatric: Mood, memory, affect and judgment normal.  Nursing note and vitals reviewed.    LABORATORY DATA:  I have reviewed the labs as listed.  CBC    Component Value Date/Time   WBC 6.7 08/10/2017 0851   RBC 4.93 08/10/2017 0851   HGB 13.3 08/10/2017 0851   HCT 40.1 08/10/2017 0851   PLT 247 08/10/2017 0851   MCV 81.3 08/10/2017 0851   MCH 27.0 08/10/2017 0851   MCHC 33.2 08/10/2017 0851   RDW 15.0 08/10/2017 0851   LYMPHSABS 2.0 08/10/2017 0851   MONOABS 0.6 08/10/2017 0851   EOSABS 0.1 08/10/2017 0851   BASOSABS 0.0 08/10/2017 0851   CMP Latest Ref Rng & Units 08/10/2017 07/27/2017 07/20/2017  Glucose 65 - 99 mg/dL 119(H) 111(H) 111(H)  BUN 6 - 20 mg/dL 14 5(L) 7  Creatinine 0.61 - 1.24 mg/dL 0.98 0.74 0.78  Sodium 135 - 145 mmol/L 138 138 136  Potassium 3.5 - 5.1 mmol/L 3.7 3.7 3.2(L)  Chloride 101 - 111 mmol/L 104 106 104  CO2 22 - 32 mmol/L '22 25 25  ' Calcium 8.9 - 10.3 mg/dL 9.4 8.7(L) 9.2  Total Protein 6.5 - 8.1 g/dL 7.9 6.3(L) 7.4  Total Bilirubin 0.3 - 1.2 mg/dL 0.7 0.6 0.9  Alkaline Phos 38 - 126 U/L 57 54 55  AST 15 - 41 U/L '24 16 17  ' ALT 17 - 63 U/L 20 11(L) 13(L)    PENDING LABS:    DIAGNOSTIC IMAGING:  *The following radiologic images and reports have been reviewed independently and agree with below findings.  CT chest/abd/pelvis: 08/08/17 CLINICAL DATA:  Distal esophageal and gastric cardia adenocarcinoma, ongoing chemotherapy.  EXAM: CT CHEST, ABDOMEN, AND PELVIS WITH CONTRAST  TECHNIQUE: Multidetector CT imaging of the chest, abdomen and pelvis  was performed following the standard protocol during bolus administration of intravenous contrast.  CONTRAST:  137m ISOVUE-300 IOPAMIDOL (ISOVUE-300) INJECTION 61%  COMPARISON:  05/02/2017  FINDINGS: CT CHEST FINDINGS  Cardiovascular: Right Port-A-Cath tip:  Distal SVC.  Mediastinum/Nodes: By subjective assessment there is significant reduction in the enhancing tumor along the lumen of the distal esophagus and gastric cardia. There still some mild irregular polypoid filling defect in this vicinity for example images 41 through 49 of series 3, but compared to the prior exam there was significantly more enhancement along the luminal margin in this vicinity on the previous exam. Accordingly the abnormality in this region is significantly improved but not resolved.  No pathologic thoracic adenopathy.  Lungs/Pleura: The right upper lobe nodule of concern on the prior exam is nearly completely resolved. On image 42/5 of today's exam there is a highly indistinct 2 mm residual density in this vicinity, much reduced in conspicuity compared to the prior exam. No new nodules.  Musculoskeletal: Unremarkable  CT ABDOMEN PELVIS FINDINGS  Hepatobiliary: Gallstones are present within the contracted gallbladder. No appreciable extrahepatic biliary dilatation. No findings of metastatic disease to the liver at this time.  Pancreas: Unremarkable  Spleen: Unremarkable  Adrenals/Urinary Tract: Stable hypodense 1.5 by 1.3 cm exophytic lesion from the left kidney lower pole with internal fluid density of 15 Hounsfield units.  Stable 1.2 by 0.9 cm left kidney lower pole lesion on image 74/3, this lesion has higher density characteristics with a density measurement of 34 Hounsfield units on image 104/6, probably a complex cyst but less technically specific and meriting surveillance.  2 mm right kidney upper pole nonobstructive renal calculus and 2 mm left kidney upper pole  nonobstructive renal calculus. No hydronephrosis or hydroureter.  Stomach/Bowel: As noted above,  there is a reduction in the amount of enhancing tumor along the lumen of the gastric cardia. Prominent stool throughout the colon favors constipation.  Vascular/Lymphatic: Right gastric node 0.8 cm in short axis on image 53/3, stable. Poor definition of fat planes around the celiac chain, stable. Retrocaval node 0.7 cm in short axis on image 59/3, formerly 1.2 cm. Mass or lymph node along the left adrenal gland measures 1.0 cm in short axis on image 60/3, formerly 1.9 cm, and also with significantly reduced overall enhancement compared to prior. Aortocaval node on image 66/ 3 measures 0.7 cm in short axis, formerly 1.3 cm.  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 Mild lumbar spondylosis and degenerative disc disease causing mild degrees of impingement at L4-5 at L5-S1. Small lipoma interposed between the left inferior rib cage and the left upper rectus abdominus muscle, unchanged.  IMPRESSION: 1. The dominant trend on today's exam is further reduction in size of the gastroesophageal mass, and significant reduction in the surrounding upper abdominal adenopathy. 2. The small right upper lobe nodule of concern on the prior exam has nearly completely resolved. 3. Bilateral nonobstructive nephrolithiasis. There is a small complex lesion of the left kidney lower pole which is stable and probably complex cysts but which may merit surveillance. 4. Other imaging findings of potential clinical significance: Cholelithiasis. Trace free pelvic fluid. Mild lower lumbar impingement. Fused sacroiliac joints.   Electronically Signed   By: Van Clines M.D.   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 One[place] Alar.  PET scan 01/19/17 revealed primary malignancy involving distal esophagus and gastric cardia, with nodes at base of right neck most consistent with metastatic disease; also metastatic nodes in upper abdomen. Underwent (R) supraclavicular lymph node biopsy on 01/16/17 revealing metastatic poorly differentiated carcinoma; HER2-.  He did not undergo EUS prior to treatment.  Started systemic treatment with FOLFOX chemotherapy from 02/15/17-04/25/17.  Restaging scans in 04/2017 showed residual tumor in the distal esophagus and gastric cardia, without significant change in hypermetabolic lymphadenopathy.  Given sub-optimal response, treatment was changed to Taxol/Cyramza on 05/18/17.   -Most recent restaging CT chest/abd/pelvis on 08/08/17 showed that the dominant trend was further reduction in size of gastroesophageal mass, and with significant reduction in surrounding upper abdominal adenopathy; previously seen right upper lobe nodule of concern on previous exam has completely resolved.  These results were reviewed in detail with patient and his wife today; they were provided a paper copy of radiologic reports as well. -Given excellent response to change in therapy, plan to continue Taxol/Cyramza as scheduled.  We will plan to repeat restaging imaging in 3-4 months, or sooner if clinically indicated. -Labs reviewed and are adequate for treatment today. -Return to cancer center in about 1 month on day 1 of subsequent cycle of Taxol/Cyramza.   Hypertension:  -BP 150s/100s today; could be secondary to [place].  Cozaar dose was increased to 50 mg about 6 weeks ago with minimal decrease in his BP.  -Will add hydrochlorothiazide 12.5 mg once daily to his antihypertensive regimen; E-scribed to his pharmacy.  Reviewed possible side effects and how to take this  medication.  If 12.5 mg provides more optimal control of his blood pressure, then we can prescribe combination Cozaar/HCTZ medication and 1 pill.  However he understands that we may need to increase the HCTZ to 25 mg if his blood pressure is not better controlled over the next few weeks.   Iron deficiency anemia:  -Likely from esophageal malignancy/ulceration.  -s/p 2 doses of Feraheme on 03/02/17 & 03/09/17 for low ferritin of 9 and low iron saturations of 9%.  Received an additional dose of IV Feraheme on 04/25/17. -Hemoglobin today is normal at 13.3 g/dL.  He denies any significant fatigue, denies any frank bleeding episodes.  We will continue to intermittently monitor iron studies as needed.       Dispo:  -Continue Taxol/Cyramza as directed. -Return to cancer center in about 1 month on day 1 of next cycle of treatment.     All questions were answered to patient's stated satisfaction. Encouraged patient to call with any new concerns or questions before his next visit to the cancer center and we can certain see him sooner, if needed.      Orders placed this encounter:  No orders of the defined types were placed in this encounter.     Maxwell Craze, NP [place] [phone]

## 2017-08-10 NOTE — Progress Notes (Signed)
0950 Lab and urine protein results reviewed with Faythe Casa NP and pt seen by [name] NP prior to administering chemotherapy                                                Maxwell Henderson tolerated Taxol and Cyramza infusions well without complaints or incident. VSS upon discharge. Pt discharged self ambulatory in satisfactory condition accompanied by his wife

## 2017-08-10 NOTE — Patient Instructions (Signed)
[facility] Discharge Instructions for Patients Receiving Chemotherapy   Beginning January 23rd 2017 lab work for the [facility] will be done in the  Main lab at [facility] on 1st floor. If you have a lab appointment with the [facility] please come in thru the  Main Entrance and check in at the main information desk   Today you received the following chemotherapy agents Taxol and Cyramza. Follow-up as scheduled. Call clinic for any questions or concerns  To help prevent nausea and vomiting after your treatment, we encourage you to take your nausea medication   If you develop nausea and vomiting, or diarrhea that is not controlled by your medication, call the clinic.  The clinic phone number is [phone]. Office hours are Monday-Friday 8:30am-5:00pm.  BELOW ARE SYMPTOMS THAT SHOULD BE REPORTED IMMEDIATELY:  *FEVER GREATER THAN 101.0 F  *CHILLS WITH OR WITHOUT FEVER  NAUSEA AND VOMITING THAT IS NOT CONTROLLED WITH YOUR NAUSEA MEDICATION  *UNUSUAL SHORTNESS OF BREATH  *UNUSUAL BRUISING OR BLEEDING  TENDERNESS IN MOUTH AND THROAT WITH OR WITHOUT PRESENCE OF ULCERS  *URINARY PROBLEMS  *BOWEL PROBLEMS  UNUSUAL RASH Items with * indicate a potential emergency and should be followed up as soon as possible. If you have an emergency after office hours please contact your primary care physician or go to the nearest emergency department.  Please call the clinic during office hours if you have any questions or concerns.   You may also contact the Patient Navigator at [phone] should you have any questions or need assistance in obtaining follow up care.      Resources For Cancer Patients and their Caregivers ? American Cancer Society: Can assist with transportation, wigs, general needs, runs Look Good Feel Better.        404-444-6508 ? Cancer Care: Provides financial assistance, online support groups, medication/co-pay assistance.   1-800-813-HOPE (872)244-2058) ? [facility] Assists [place] cancer patients and their families through emotional , educational and financial support.  (402)021-9707 ? [place] DSS Where to apply for food stamps, Medicaid and utility assistance. (650) 354-6924 ? [facility]: Transportation to medical appointments. (231)032-8907 ? Social Security Administration: May apply for disability if have a Stage IV cancer. (760)198-9750 727-317-0728 ? [employer], Disability and Transit Services: Assists with nutrition, care and transit needs. 2760329809

## 2017-08-17 ENCOUNTER — Inpatient Hospital Stay (HOSPITAL_COMMUNITY): Payer: Managed Care, Other (non HMO) | Attending: Oncology

## 2017-08-17 ENCOUNTER — Encounter (HOSPITAL_COMMUNITY): Payer: Self-pay

## 2017-08-17 ENCOUNTER — Encounter: Payer: Self-pay | Admitting: Oncology

## 2017-08-17 VITALS — BP 140/105 | HR 91 | Temp 98.1°F | Resp 20 | Wt 212.6 lb

## 2017-08-17 DIAGNOSIS — C155 Malignant neoplasm of lower third of esophagus: Secondary | ICD-10-CM | POA: Insufficient documentation

## 2017-08-17 DIAGNOSIS — I10 Essential (primary) hypertension: Secondary | ICD-10-CM | POA: Insufficient documentation

## 2017-08-17 DIAGNOSIS — C160 Malignant neoplasm of cardia: Secondary | ICD-10-CM | POA: Diagnosis not present

## 2017-08-17 DIAGNOSIS — C770 Secondary and unspecified malignant neoplasm of lymph nodes of head, face and neck: Secondary | ICD-10-CM | POA: Diagnosis not present

## 2017-08-17 DIAGNOSIS — C159 Malignant neoplasm of esophagus, unspecified: Secondary | ICD-10-CM

## 2017-08-17 DIAGNOSIS — Z5112 Encounter for antineoplastic immunotherapy: Secondary | ICD-10-CM | POA: Diagnosis present

## 2017-08-17 DIAGNOSIS — Z5111 Encounter for antineoplastic chemotherapy: Secondary | ICD-10-CM | POA: Diagnosis present

## 2017-08-17 DIAGNOSIS — D509 Iron deficiency anemia, unspecified: Secondary | ICD-10-CM | POA: Diagnosis not present

## 2017-08-17 DIAGNOSIS — F1011 Alcohol abuse, in remission: Secondary | ICD-10-CM | POA: Insufficient documentation

## 2017-08-17 DIAGNOSIS — F1290 Cannabis use, unspecified, uncomplicated: Secondary | ICD-10-CM | POA: Insufficient documentation

## 2017-08-17 DIAGNOSIS — G620 Drug-induced polyneuropathy: Secondary | ICD-10-CM | POA: Insufficient documentation

## 2017-08-17 DIAGNOSIS — R05 Cough: Secondary | ICD-10-CM

## 2017-08-17 DIAGNOSIS — R059 Cough, unspecified: Secondary | ICD-10-CM

## 2017-08-17 LAB — COMPREHENSIVE METABOLIC PANEL
ALT: 16 U/L — ABNORMAL LOW (ref 17–63)
AST: 24 U/L (ref 15–41)
Albumin: 4.4 g/dL (ref 3.5–5.0)
Alkaline Phosphatase: 56 U/L (ref 38–126)
Anion gap: 13 (ref 5–15)
BUN: 11 mg/dL (ref 6–20)
CO2: 23 mmol/L (ref 22–32)
Calcium: 9.2 mg/dL (ref 8.9–10.3)
Chloride: 101 mmol/L (ref 101–111)
Creatinine, Ser: 0.91 mg/dL (ref 0.61–1.24)
GFR calc Af Amer: >60 mL/min (ref >60)
GFR calc non Af Amer: >60 mL/min (ref >60)
Glucose, Bld: 116 mg/dL — ABNORMAL HIGH (ref 65–99)
Potassium: 3.3 mmol/L — ABNORMAL LOW (ref 3.5–5.1)
Sodium: 137 mmol/L (ref 135–145)
Total Bilirubin: 0.6 mg/dL (ref 0.3–1.2)
Total Protein: 7.7 g/dL (ref 6.5–8.1)

## 2017-08-17 LAB — CBC WITH DIFFERENTIAL/PLATELET
Basophils Absolute: 0 10*3/uL (ref 0.0–0.1)
Basophils Relative: 1 %
Eosinophils Absolute: 0.1 10*3/uL (ref 0.0–0.7)
Eosinophils Relative: 2 %
HCT: 42.8 % (ref 39.0–52.0)
Hemoglobin: 14.3 g/dL (ref 13.0–17.0)
Lymphocytes Relative: 29 %
Lymphs Abs: 1.5 10*3/uL (ref 0.7–4.0)
MCH: 26.9 pg (ref 26.0–34.0)
MCHC: 33.4 g/dL (ref 30.0–36.0)
MCV: 80.5 fL (ref 78.0–100.0)
Monocytes Absolute: 0.3 10*3/uL (ref 0.1–1.0)
Monocytes Relative: 6 %
Neutro Abs: 3.3 10*3/uL (ref 1.7–7.7)
Neutrophils Relative %: 62 %
Platelets: 256 10*3/uL (ref 150–400)
RBC: 5.32 MIL/uL (ref 4.22–5.81)
RDW: 14.8 % (ref 11.5–15.5)
WBC: 5.2 10*3/uL (ref 4.0–10.5)

## 2017-08-17 MED ORDER — FAMOTIDINE IN NACL 20-0.9 MG/50ML-% IV SOLN
20.0000 mg | Freq: Once | INTRAVENOUS | Status: AC
  Administered 2017-08-17: 20 mg via INTRAVENOUS

## 2017-08-17 MED ORDER — MISC. DEVICES MISC: 0 refills | Status: DC

## 2017-08-17 MED ORDER — POTASSIUM CHLORIDE CRYS ER 20 MEQ PO TBCR
20.0000 meq | EXTENDED_RELEASE_TABLET | Freq: Once | ORAL | Status: AC
  Administered 2017-08-17: 20 meq via ORAL

## 2017-08-17 MED ORDER — SODIUM CHLORIDE 0.9 % IV SOLN
Freq: Once | INTRAVENOUS | Status: AC
  Administered 2017-08-17: 10:00:00 via INTRAVENOUS

## 2017-08-17 MED ORDER — DEXAMETHASONE SODIUM PHOSPHATE 100 MG/10ML IJ SOLN: 10.0000 mg | Freq: Once | INTRAMUSCULAR | Status: DC

## 2017-08-17 MED ORDER — PACLITAXEL CHEMO INJECTION 300 MG/50ML
80.0000 mg/m2 | Freq: Once | INTRAVENOUS | Status: AC
  Administered 2017-08-17: 174 mg via INTRAVENOUS
  Filled 2017-08-17: qty 29

## 2017-08-17 MED ORDER — DEXAMETHASONE SODIUM PHOSPHATE 10 MG/ML IJ SOLN
10.0000 mg | Freq: Once | INTRAMUSCULAR | Status: AC
  Administered 2017-08-17: 10 mg via INTRAVENOUS

## 2017-08-17 MED ORDER — HEPARIN SOD (PORK) LOCK FLUSH 100 UNIT/ML IV SOLN
500.0000 [IU] | Freq: Once | INTRAVENOUS | Status: AC | PRN
  Administered 2017-08-17: 500 [IU]

## 2017-08-17 MED ORDER — POTASSIUM CHLORIDE CRYS ER 20 MEQ PO TBCR: 10.0000 meq | EXTENDED_RELEASE_TABLET | Freq: Once | ORAL | Status: DC

## 2017-08-17 MED ORDER — SODIUM CHLORIDE 0.9% FLUSH
10.0000 mL | INTRAVENOUS | Status: DC | PRN
  Administered 2017-08-17: 10 mL
  Filled 2017-08-17: qty 10

## 2017-08-17 MED ORDER — DIPHENHYDRAMINE HCL 50 MG/ML IJ SOLN
50.0000 mg | Freq: Once | INTRAMUSCULAR | Status: AC
  Administered 2017-08-17: 50 mg via INTRAVENOUS

## 2017-08-17 NOTE — Patient Instructions (Signed)
[facility] Discharge Instructions for Patients Receiving Chemotherapy   Beginning January 23rd 2017 lab work for the Cancer Center will be done in the  Main lab at [facility] on 1st floor. If you have a lab appointment with the Cancer Center please come in thru the  Main Entrance and check in at the main information desk   Today you received the following chemotherapy agents   To help prevent nausea and vomiting after your treatment, we encourage you to take your nausea medication     If you develop nausea and vomiting, or diarrhea that is not controlled by your medication, call the clinic.  The clinic phone number is [phone]. Office hours are Monday-Friday 8:30am-5:00pm.  BELOW ARE SYMPTOMS THAT SHOULD BE REPORTED IMMEDIATELY:  *FEVER GREATER THAN 101.0 F  *CHILLS WITH OR WITHOUT FEVER  NAUSEA AND VOMITING THAT IS NOT CONTROLLED WITH YOUR NAUSEA MEDICATION  *UNUSUAL SHORTNESS OF BREATH  *UNUSUAL BRUISING OR BLEEDING  TENDERNESS IN MOUTH AND THROAT WITH OR WITHOUT PRESENCE OF ULCERS  *URINARY PROBLEMS  *BOWEL PROBLEMS  UNUSUAL RASH Items with * indicate a potential emergency and should be followed up as soon as possible. If you have an emergency after office hours please contact your primary care physician or go to the nearest emergency department.  Please call the clinic during office hours if you have any questions or concerns.   You may also contact the Patient Navigator at [phone] should you have any questions or need assistance in obtaining follow up care.      Resources For Cancer Patients and their Caregivers ? American Cancer Society: Can assist with transportation, wigs, general needs, runs Look Good Feel Better.        1-888-227-6333 ? Cancer Care: Provides financial assistance, online support groups, medication/co-pay assistance.  1-800-813-HOPE (4673) ? [facility] Assists [place] cancer  patients and their families through emotional , educational and financial support.  336-427-4357 ? [place] DSS Where to apply for food stamps, Medicaid and utility assistance. 336-342-1394 ? [facility]: Transportation to medical appointments. 336-347-2287 ? Social Security Administration: May apply for disability if have a Stage IV cancer. 336-342-7796 1-800-772-1213 ? [place] [facility], Disability and Transit Services: Assists with nutrition, care and transit needs. 336-349-2343         

## 2017-08-17 NOTE — Progress Notes (Signed)
Labs reviewed with Kirby Crigler PA-C. Proceed with treatment.  Treatment given per orders. Patient tolerated it well without problems. Vitals stable and discharged home from clinic ambulatory. Follow up as scheduled.

## 2017-08-24 ENCOUNTER — Other Ambulatory Visit (HOSPITAL_COMMUNITY): Payer: Self-pay | Admitting: Oncology

## 2017-08-24 ENCOUNTER — Inpatient Hospital Stay (HOSPITAL_COMMUNITY): Payer: Managed Care, Other (non HMO)

## 2017-08-24 ENCOUNTER — Encounter: Payer: Self-pay | Admitting: Oncology

## 2017-08-24 VITALS — BP 144/107 | HR 91 | Temp 98.5°F | Resp 20 | Wt 213.0 lb

## 2017-08-24 DIAGNOSIS — C159 Malignant neoplasm of esophagus, unspecified: Secondary | ICD-10-CM

## 2017-08-24 DIAGNOSIS — Z5112 Encounter for antineoplastic immunotherapy: Secondary | ICD-10-CM | POA: Diagnosis not present

## 2017-08-24 LAB — URINALYSIS, DIPSTICK ONLY
BILIRUBIN URINE: NEGATIVE
Glucose, UA: NEGATIVE mg/dL
HGB URINE DIPSTICK: NEGATIVE
Ketones, ur: NEGATIVE mg/dL
Leukocytes, UA: NEGATIVE
Nitrite: NEGATIVE
PH: 5 (ref 5.0–8.0)
Protein, ur: 30 mg/dL — AB
SPECIFIC GRAVITY, URINE: 1.029 (ref 1.005–1.030)

## 2017-08-24 LAB — CBC WITH DIFFERENTIAL/PLATELET
Basophils Absolute: 0 10*3/uL (ref 0.0–0.1)
Basophils Relative: 1 %
EOS ABS: 0.1 10*3/uL (ref 0.0–0.7)
Eosinophils Relative: 2 %
HEMATOCRIT: 42.6 % (ref 39.0–52.0)
Hemoglobin: 14.4 g/dL (ref 13.0–17.0)
LYMPHS PCT: 47 %
Lymphs Abs: 2.9 10*3/uL (ref 0.7–4.0)
MCH: 26.9 pg (ref 26.0–34.0)
MCHC: 33.8 g/dL (ref 30.0–36.0)
MCV: 79.6 fL (ref 78.0–100.0)
MONOS PCT: 3 %
Monocytes Absolute: 0.2 10*3/uL (ref 0.1–1.0)
NEUTROS ABS: 2.9 10*3/uL (ref 1.7–7.7)
NEUTROS PCT: 47 %
Platelets: 318 10*3/uL (ref 150–400)
RBC: 5.35 MIL/uL (ref 4.22–5.81)
RDW: 14.8 % (ref 11.5–15.5)
WBC: 6.1 10*3/uL (ref 4.0–10.5)

## 2017-08-24 LAB — COMPREHENSIVE METABOLIC PANEL
ALT: 20 U/L (ref 17–63)
AST: 27 U/L (ref 15–41)
Albumin: 4.4 g/dL (ref 3.5–5.0)
Alkaline Phosphatase: 54 U/L (ref 38–126)
Anion gap: 11 (ref 5–15)
BUN: 14 mg/dL (ref 6–20)
CHLORIDE: 103 mmol/L (ref 101–111)
CO2: 23 mmol/L (ref 22–32)
CREATININE: 0.94 mg/dL (ref 0.61–1.24)
Calcium: 9.4 mg/dL (ref 8.9–10.3)
Glucose, Bld: 159 mg/dL — ABNORMAL HIGH (ref 65–99)
POTASSIUM: 3.4 mmol/L — AB (ref 3.5–5.1)
SODIUM: 137 mmol/L (ref 135–145)
Total Bilirubin: 0.7 mg/dL (ref 0.3–1.2)
Total Protein: 7.7 g/dL (ref 6.5–8.1)

## 2017-08-24 MED ORDER — SODIUM CHLORIDE 0.9 % IV SOLN: 10.0000 mg | Freq: Once | INTRAVENOUS | Status: DC

## 2017-08-24 MED ORDER — POTASSIUM CHLORIDE CRYS ER 20 MEQ PO TBCR
40.0000 meq | EXTENDED_RELEASE_TABLET | Freq: Once | ORAL | Status: AC
Start: 2017-08-24 — End: 2017-08-24
  Administered 2017-08-24: 40 meq via ORAL
  Filled 2017-08-24: qty 2

## 2017-08-24 MED ORDER — DEXAMETHASONE SODIUM PHOSPHATE 10 MG/ML IJ SOLN
INTRAMUSCULAR | Status: AC
  Filled 2017-08-24: qty 1

## 2017-08-24 MED ORDER — ACETAMINOPHEN 325 MG PO TABS
ORAL_TABLET | ORAL | Status: AC
  Filled 2017-08-24: qty 2

## 2017-08-24 MED ORDER — DIPHENHYDRAMINE HCL 50 MG/ML IJ SOLN
50.0000 mg | Freq: Once | INTRAMUSCULAR | Status: AC
  Administered 2017-08-24: 50 mg via INTRAVENOUS

## 2017-08-24 MED ORDER — DIPHENHYDRAMINE HCL 50 MG/ML IJ SOLN
INTRAMUSCULAR | Status: AC
  Filled 2017-08-24: qty 1

## 2017-08-24 MED ORDER — DEXAMETHASONE SODIUM PHOSPHATE 10 MG/ML IJ SOLN
10.0000 mg | Freq: Once | INTRAMUSCULAR | Status: AC
  Administered 2017-08-24: 10 mg via INTRAVENOUS

## 2017-08-24 MED ORDER — ACETAMINOPHEN 325 MG PO TABS
650.0000 mg | ORAL_TABLET | Freq: Once | ORAL | Status: AC
  Administered 2017-08-24: 650 mg via ORAL

## 2017-08-24 MED ORDER — SODIUM CHLORIDE 0.9 % IV SOLN
8.0000 mg/kg | Freq: Once | INTRAVENOUS | Status: AC
  Administered 2017-08-24: 800 mg via INTRAVENOUS
  Filled 2017-08-24: qty 30

## 2017-08-24 MED ORDER — PACLITAXEL CHEMO INJECTION 300 MG/50ML
80.0000 mg/m2 | Freq: Once | INTRAVENOUS | Status: AC
  Administered 2017-08-24: 174 mg via INTRAVENOUS
  Filled 2017-08-24: qty 29

## 2017-08-24 MED ORDER — FAMOTIDINE IN NACL 20-0.9 MG/50ML-% IV SOLN
20.0000 mg | Freq: Once | INTRAVENOUS | Status: AC
  Administered 2017-08-24: 20 mg via INTRAVENOUS

## 2017-08-24 MED ORDER — HEPARIN SOD (PORK) LOCK FLUSH 100 UNIT/ML IV SOLN
500.0000 [IU] | Freq: Once | INTRAVENOUS | Status: AC | PRN
  Administered 2017-08-24: 500 [IU]
  Filled 2017-08-24: qty 5

## 2017-08-24 MED ORDER — SODIUM CHLORIDE 0.9 % IV SOLN
Freq: Once | INTRAVENOUS | Status: AC
  Administered 2017-08-24: 11:00:00 via INTRAVENOUS

## 2017-08-24 NOTE — Progress Notes (Signed)
Tolerated infusions w/o adverse reaction.  Alert, in no distress.  VSS.  Discharged ambulatory.  

## 2017-08-30 ENCOUNTER — Other Ambulatory Visit (HOSPITAL_COMMUNITY): Payer: Self-pay

## 2017-08-30 DIAGNOSIS — C159 Malignant neoplasm of esophagus, unspecified: Secondary | ICD-10-CM

## 2017-08-30 MED ORDER — LOSARTAN POTASSIUM 50 MG PO TABS: 50.0000 mg | ORAL_TABLET | Freq: Every day | ORAL | 1 refills | Status: DC

## 2017-08-30 NOTE — Telephone Encounter (Signed)
Received refill request from patients pharmacy for Cozaar. Reviewed with provider, chart checked and refilled.

## 2017-09-07 ENCOUNTER — Inpatient Hospital Stay (HOSPITAL_BASED_OUTPATIENT_CLINIC_OR_DEPARTMENT_OTHER): Payer: Managed Care, Other (non HMO) | Admitting: Adult Health

## 2017-09-07 ENCOUNTER — Inpatient Hospital Stay (HOSPITAL_COMMUNITY): Payer: Managed Care, Other (non HMO)

## 2017-09-07 ENCOUNTER — Encounter (HOSPITAL_COMMUNITY): Payer: Self-pay

## 2017-09-07 ENCOUNTER — Encounter (HOSPITAL_COMMUNITY): Payer: Self-pay | Admitting: Internal Medicine

## 2017-09-07 VITALS — BP 142/99 | HR 75 | Temp 97.7°F | Resp 18 | Wt 218.6 lb

## 2017-09-07 DIAGNOSIS — C155 Malignant neoplasm of lower third of esophagus: Secondary | ICD-10-CM | POA: Diagnosis not present

## 2017-09-07 DIAGNOSIS — G620 Drug-induced polyneuropathy: Secondary | ICD-10-CM | POA: Diagnosis not present

## 2017-09-07 DIAGNOSIS — C160 Malignant neoplasm of cardia: Secondary | ICD-10-CM

## 2017-09-07 DIAGNOSIS — C770 Secondary and unspecified malignant neoplasm of lymph nodes of head, face and neck: Secondary | ICD-10-CM

## 2017-09-07 DIAGNOSIS — D509 Iron deficiency anemia, unspecified: Secondary | ICD-10-CM

## 2017-09-07 DIAGNOSIS — Z5112 Encounter for antineoplastic immunotherapy: Secondary | ICD-10-CM | POA: Diagnosis not present

## 2017-09-07 DIAGNOSIS — I10 Essential (primary) hypertension: Secondary | ICD-10-CM

## 2017-09-07 DIAGNOSIS — C159 Malignant neoplasm of esophagus, unspecified: Secondary | ICD-10-CM

## 2017-09-07 DIAGNOSIS — Z5111 Encounter for antineoplastic chemotherapy: Secondary | ICD-10-CM

## 2017-09-07 LAB — CBC WITH DIFFERENTIAL/PLATELET
BASOS ABS: 0 10*3/uL (ref 0.0–0.1)
Basophils Relative: 0 %
Eosinophils Absolute: 0.1 10*3/uL (ref 0.0–0.7)
Eosinophils Relative: 1 %
HEMATOCRIT: 39 % (ref 39.0–52.0)
Hemoglobin: 12.9 g/dL — ABNORMAL LOW (ref 13.0–17.0)
LYMPHS PCT: 32 %
Lymphs Abs: 2 10*3/uL (ref 0.7–4.0)
MCH: 26.7 pg (ref 26.0–34.0)
MCHC: 33.1 g/dL (ref 30.0–36.0)
MCV: 80.6 fL (ref 78.0–100.0)
Monocytes Absolute: 0.7 10*3/uL (ref 0.1–1.0)
Monocytes Relative: 12 %
NEUTROS ABS: 3.4 10*3/uL (ref 1.7–7.7)
Neutrophils Relative %: 55 %
Platelets: 231 10*3/uL (ref 150–400)
RBC: 4.84 MIL/uL (ref 4.22–5.81)
RDW: 15.1 % (ref 11.5–15.5)
WBC: 6.1 10*3/uL (ref 4.0–10.5)

## 2017-09-07 LAB — COMPREHENSIVE METABOLIC PANEL
ALBUMIN: 4.2 g/dL (ref 3.5–5.0)
ALK PHOS: 56 U/L (ref 38–126)
ALT: 15 U/L — ABNORMAL LOW (ref 17–63)
AST: 19 U/L (ref 15–41)
Anion gap: 11 (ref 5–15)
BILIRUBIN TOTAL: 0.6 mg/dL (ref 0.3–1.2)
BUN: 16 mg/dL (ref 6–20)
CALCIUM: 8.8 mg/dL — AB (ref 8.9–10.3)
CO2: 24 mmol/L (ref 22–32)
Chloride: 101 mmol/L (ref 101–111)
Creatinine, Ser: 0.97 mg/dL (ref 0.61–1.24)
GFR calc Af Amer: >60 mL/min (ref >60)
GFR calc non Af Amer: >60 mL/min (ref >60)
GLUCOSE: 131 mg/dL — AB (ref 65–99)
Potassium: 3.9 mmol/L (ref 3.5–5.1)
Sodium: 136 mmol/L (ref 135–145)
TOTAL PROTEIN: 7.3 g/dL (ref 6.5–8.1)

## 2017-09-07 LAB — URINALYSIS, DIPSTICK ONLY
Bilirubin Urine: NEGATIVE
GLUCOSE, UA: NEGATIVE mg/dL
Hgb urine dipstick: NEGATIVE
Ketones, ur: NEGATIVE mg/dL
LEUKOCYTES UA: NEGATIVE
Nitrite: NEGATIVE
PH: 5 (ref 5.0–8.0)
PROTEIN: NEGATIVE mg/dL
Specific Gravity, Urine: 1.024 (ref 1.005–1.030)

## 2017-09-07 MED ORDER — SODIUM CHLORIDE 0.9 % IV SOLN
Freq: Once | INTRAVENOUS | Status: AC
  Administered 2017-09-07: 10:00:00 via INTRAVENOUS

## 2017-09-07 MED ORDER — DIPHENHYDRAMINE HCL 50 MG/ML IJ SOLN
50.0000 mg | Freq: Once | INTRAMUSCULAR | Status: AC
  Administered 2017-09-07: 50 mg via INTRAVENOUS
  Filled 2017-09-07: qty 1

## 2017-09-07 MED ORDER — DEXAMETHASONE SODIUM PHOSPHATE 10 MG/ML IJ SOLN
10.0000 mg | Freq: Once | INTRAMUSCULAR | Status: AC
  Administered 2017-09-07: 10 mg via INTRAVENOUS
  Filled 2017-09-07: qty 1

## 2017-09-07 MED ORDER — ACETAMINOPHEN 325 MG PO TABS
650.0000 mg | ORAL_TABLET | Freq: Once | ORAL | Status: AC
  Administered 2017-09-07: 650 mg via ORAL
  Filled 2017-09-07: qty 2

## 2017-09-07 MED ORDER — FAMOTIDINE IN NACL 20-0.9 MG/50ML-% IV SOLN
20.0000 mg | Freq: Once | INTRAVENOUS | Status: AC
  Administered 2017-09-07: 20 mg via INTRAVENOUS
  Filled 2017-09-07: qty 50

## 2017-09-07 MED ORDER — SODIUM CHLORIDE 0.9% FLUSH
10.0000 mL | INTRAVENOUS | Status: DC | PRN
  Administered 2017-09-07: 10 mL
  Filled 2017-09-07: qty 10

## 2017-09-07 MED ORDER — SODIUM CHLORIDE 0.9 % IV SOLN: 10.0000 mg | Freq: Once | INTRAVENOUS | Status: DC

## 2017-09-07 MED ORDER — SODIUM CHLORIDE 0.9 % IV SOLN
8.0000 mg/kg | Freq: Once | INTRAVENOUS | Status: AC
  Administered 2017-09-07: 800 mg via INTRAVENOUS
  Filled 2017-09-07: qty 30

## 2017-09-07 MED ORDER — HEPARIN SOD (PORK) LOCK FLUSH 100 UNIT/ML IV SOLN
500.0000 [IU] | Freq: Once | INTRAVENOUS | Status: AC | PRN
  Administered 2017-09-07: 500 [IU]
  Filled 2017-09-07: qty 5

## 2017-09-07 NOTE — Progress Notes (Signed)
0945 Lab and urine results reviewed with and pt seen by [name] NP and pt approved for Cyramza infusion only today due to continued neuropathy in both feet.                                                                                       Maxwell Henderson tolerated Cyramza infusion well without complaints or incident. VSS upon discharge. Pt discharged self ambulatory in satisfactory condition accompanied by his wife

## 2017-09-07 NOTE — Progress Notes (Signed)
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 Barney Drain. Partially obstructing, likely malignant esophageal tumor was found in the distal esophagus. Biopsied with surgical path demonstrating Adenocarcinoma. Injected. Treated with argon plasma coagulation (APC) #3. 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 Biopsied with surgical path demonstrating Adenocarcinoma.      01/19/2017 PET scan    NECK Two FDG avid nodes are seen in the base of the neck on the right. The first is seen on CT image 35, just posterior to the right thyroid lobe and the other is seen on CT image 41 measuring 16 mm in short axis with a maximum SUV of 8.6.  CHEST  The patient's known malignancy involving the distal esophagus and cardia of the stomach is FDG avid with a maximum SUV of 10.4. No definitive metastatic nodes within the chest.  ABDOMEN/PELVIS  FDG avid metastatic adenopathy is seen in the gastrohepatic ligament, paraceliac nodes, posterior to the IVC on image 111, and to the left of the SMA on image 112. The most inferior node is seen in the aortocaval region on image 117. The representative node to the left of the SMA was measured on series 4, image 111 measuring 2.5 by 2.1 cm with a maximum SUV of 7. No other FDG avid disease in the abdomen or pelvis. Cholelithiasis is identified.      02/02/2017 Procedure    US guided bx of right supraclavicular LN by IR.      02/05/2017 Pathology Results      Lymph node, needle/core biopsy, Right Supraclavicular - METASTATIC POORLY DIFFERENTIATED CARCINOMA.      02/10/2017 Pathology Results    HER2 - NEGATIVE      02/13/2017 Procedure    Port placed by IR      02/15/2017 - 04/25/2017 Chemotherapy    FOLFOX x 6 cycles       05/02/2017 Imaging    CT C/A/P: IMPRESSION: 1. Nodular thickening and hyperenhancement in the distal esophagus and gastric cardia could reflect residual tumor. 2. No significant change in size of previously demonstrated hypermetabolic upper abdominal lymph nodes. No progressive adenopathy identified. No residual enlarged supraclavicular nodes are seen. 3. No evidence of progressive metastatic disease. No worrisome hepatic findings. 4. Tiny right upper lobe pulmonary nodule, not clearly seen on low dose previous PET-CT. Attention on follow-up recommended. 5. Cholelithiasis.        INTERVAL HISTORY:  Maxwell Henderson 52 y.o. male returns for routine follow-up for esophageal cancer.   Due for next cycle of Taxol/Cyramza today.   Overall, he tells me he has been feeling great. Appetite and energy levels both 100%. 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 "They're just numb all the time."  Numbness starts at mid-foot and extends to all toes. He has been taking gabapentin 2-3 times per day for the past month or so; he has seen "a little bit of help" in his symptoms, but toes/foot remain numb.  "Other than that I  feel great!"   Denies any fatigue, mouth sores, cough/SOB, abdominal pain, diarrhea, constipation, N&V, urinary issues, headaches, or dizziness.    BP 130s/90s today. He did stop taking his losartan 50 mg for the past 2 days on his own. "I saw this commercial on TV that said that it can cause cancer, so I stopped it until I could talk to you today."   He has been taking the HCTZ as prescribed.    He feels ready for his next cycle of therapy today.    REVIEW OF SYSTEMS:  Review of  Systems  Constitutional: Negative.  Negative for chills, fatigue and fever.  HENT:  Negative.  Negative for lump/mass and nosebleeds.   Eyes: Negative.   Respiratory: Negative.  Negative for cough and shortness of breath.   Cardiovascular: Negative.  Negative for chest pain and leg swelling.  Gastrointestinal: Negative.  Negative for abdominal pain, blood in stool, constipation, diarrhea, nausea and vomiting.  Endocrine: Negative.   Genitourinary: Negative.  Negative for dysuria and hematuria.   Musculoskeletal: Negative.  Negative for arthralgias.  Skin: Negative.  Negative for rash.  Neurological: Positive for numbness. Negative for dizziness and headaches.  Hematological: Negative.  Negative for adenopathy. Does not bruise/bleed easily.  Psychiatric/Behavioral: Negative.  Negative for depression and sleep disturbance. The patient is not nervous/anxious.      PAST MEDICAL/SURGICAL HISTORY:  Past Medical History:  Diagnosis Date  . Diabetes mellitus without complication ([place])   . Hypertension   . 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 ESOPHAGOGASTRODUODENOSCOPY (EGD) WITH PROPOFOL N/A 01/16/2017   Procedure: ESOPHAGOGASTRODUODENOSCOPY (EGD) WITH PROPOFOL;  Surgeon: [name], MD;  Location: [facility];  Service: Endoscopy;  Laterality: N/A;  730   . IR FLUORO GUIDE PORT INSERTION RIGHT  02/13/2017  . IR US GUIDE VASC ACCESS RIGHT  02/13/2017  . lipoma removal    . PORTA CATH INSERTION Right 02/13/2017  . SAVORY DILATION N/A 01/16/2017   Procedure: SAVORY DILATION;  Surgeon: [name], MD;  Location: [facility];  Service: Endoscopy;  Laterality: N/A;     SOCIAL HISTORY:  Social History   Socioeconomic History  . Marital status: Married    Spouse name: Not on file  . Number of children: Not on file  . Years of education: Not on file  .  Highest education level: Not on file  Social Needs  . Financial resource strain: Not on file  . Food insecurity - worry: Not on file  . Food insecurity - inability: Not on file  . Transportation needs - medical: Not on file  . Transportation needs - non-medical: Not on file  Occupational History  . Not on file  Tobacco Use  . Smoking status: Former Smoker    Packs/day: 0.25    Types: Cigarettes  . Smokeless tobacco: Never Used  Substance and Sexual Activity  . Alcohol use: No    Comment: hx heavy alcohol, hasn't drank in 19 yrs  . Drug use: Yes    Types: Marijuana    Comment: history of marijuana   . Sexual activity: Yes  Other Topics Concern  . Not on file  Social History Narrative   WORKING [employer].    FAMILY HISTORY:  Family History  Problem Relation Age of Onset  . Prostate cancer Father   . Colon cancer Neg Hx   .  Colon polyps Neg Hx     CURRENT MEDICATIONS:  Outpatient Encounter Medications as of 09/07/2017  Medication Sig Note  . colchicine 0.6 MG tablet Take 1 tablet (0.6 mg total) by mouth daily.   Marland Kitchen dexamethasone (DECADRON) 4 MG tablet Take 2 tablets (8 mg total) by mouth daily. Start the day after chemotherapy for 2 days. Take with food.   . Diphenhyd-Hydrocort-Nystatin (FIRST-DUKES MOUTHWASH) SUSP Use as directed 5 mLs in the mouth or throat 4 (four) times daily as needed. (Patient not taking: Reported on 08/10/2017)   . gabapentin (NEURONTIN) 300 MG capsule Take 1 cap PO qHS for 7 days, then if tolerated increase to 1 cap PO twice a day.   . hydrochlorothiazide (MICROZIDE) 12.5 MG capsule Take 1 capsule (12.5 mg total) by mouth daily.   Marland Kitchen loratadine (CLARITIN) 10 MG tablet Take 10 mg by mouth daily.   Marland Kitchen losartan (COZAAR) 50 MG tablet Take 1 tablet (50 mg total) by mouth daily. (Patient not taking: Reported on 09/07/2017) 09/07/2017: Stopped taking x 2 days  . metFORMIN (GLUCOPHAGE) 500 MG tablet Take 500 mg by mouth daily.   . Misc.  Devices MISC Please provide the patient with the [facility] HPB cough Syrup. 1 teaspoonful every 4-6 hours PRN. Please provide the patient with 360 ml.   . Morphine Sulfate (MORPHINE CONCENTRATE) 10 mg / 0.5 ml concentrated solution Take 0.5 mLs (10 mg total) by mouth every 4 (four) hours as needed for severe pain.   Marland Kitchen PACLitaxel (TAXOL IV) Inject into the vein.   . pantoprazole (PROTONIX) 40 MG tablet Take 1 tablet (40 mg total) by mouth 2 (two) times daily. Take 30 minutes before breakfast   . polyethylene glycol powder (GLYCOLAX/MIRALAX) powder Take 1 capful daily.   . prochlorperazine (COMPAZINE) 10 MG tablet TAKE 1 TABLET BY MOUTH EVERY 6 HOURS AS NEEDED FOR NAUSEA AND VOMITING   . Ramucirumab (CYRAMZA IV) Inject into the vein.   . ranitidine (ZANTAC) 300 MG tablet Take 1 tablet (300 mg total) by mouth at bedtime.   . temazepam (RESTORIL) 30 MG capsule Take 1 capsule (30 mg total) by mouth at bedtime as needed for sleep.    Facility-Administered Encounter Medications as of 09/07/2017  Medication  . [COMPLETED] 0.9 %  sodium chloride infusion  . [COMPLETED] acetaminophen (TYLENOL) tablet 650 mg  . [COMPLETED] dexamethasone (DECADRON) injection 10 mg  . [COMPLETED] diphenhydrAMINE (BENADRYL) injection 50 mg  . [COMPLETED] famotidine (PEPCID) IVPB 20 mg premix  . heparin lock flush 100 unit/mL  . ramucirumab (CYRAMZA) 800 mg in sodium chloride 0.9 % 170 mL chemo infusion  . sodium chloride flush (NS) 0.9 % injection 10 mL  . [DISCONTINUED] dexamethasone (DECADRON) 10 mg in sodium chloride 0.9 % 50 mL IVPB    ALLERGIES:  No Known Allergies   PHYSICAL EXAM:  ECOG Performance status: 0-1 - Mildly symptomatic; remains independent   BP 130/90 Pulse 82 Resp 18 Temp 97.9 O2 sat 99%  Weight: 218.6 lbs     Physical Exam  Constitutional: He is oriented to person, place, and time and well-developed, well-nourished, and in no distress.  Seen in chemo chair in infusion area   HENT:    Head: Normocephalic.  Mouth/Throat: Oropharynx is clear and moist.  Eyes: Conjunctivae are normal. No scleral icterus.  Neck: Normal range of motion. Neck supple.  Cardiovascular: Normal rate and regular rhythm.  Pulmonary/Chest: Effort normal and breath sounds normal. No respiratory distress.  Abdominal: Soft. Bowel sounds are normal.  There is no tenderness.  Musculoskeletal: Normal range of motion. He exhibits no edema.  Lymphadenopathy:    He has no cervical adenopathy.       Right: No supraclavicular adenopathy present.       Left: No supraclavicular adenopathy present.  Neurological: He is alert and oriented to person, place, and time. No cranial nerve deficit.  Skin: Skin is warm and dry. No rash noted.  Psychiatric: Mood, memory, affect and judgment normal.  Nursing note and vitals reviewed.    LABORATORY DATA:  I have reviewed the labs as listed.  CBC    Component Value Date/Time   WBC 6.1 09/07/2017 0819   RBC 4.84 09/07/2017 0819   HGB 12.9 (L) 09/07/2017 0819   HCT 39.0 09/07/2017 0819   PLT 231 09/07/2017 0819   MCV 80.6 09/07/2017 0819   MCH 26.7 09/07/2017 0819   MCHC 33.1 09/07/2017 0819   RDW 15.1 09/07/2017 0819   LYMPHSABS 2.0 09/07/2017 0819   MONOABS 0.7 09/07/2017 0819   EOSABS 0.1 09/07/2017 0819   BASOSABS 0.0 09/07/2017 0819   CMP Latest Ref Rng & Units 09/07/2017 08/24/2017 08/17/2017  Glucose 65 - 99 mg/dL 131(H) 159(H) 116(H)  BUN 6 - 20 mg/dL _0 Creatinine 0.61 - 1.24 mg/dL 0.97 0.94 0.91  Sodium 135 - 145 mmol/L 136 137 137  Potassium 3.5 - 5.1 mmol/L 3.9 3.4(L) 3.3(L)  Chloride 101 - 111 mmol/L 101 103 101  CO2 22 - 32 mmol/L _1 Calcium 8.9 - 10.3 mg/dL 8.8(L) 9.4 9.2  Total Protein 6.5 - 8.1 g/dL 7.3 7.7 7.7  Total Bilirubin 0.3 - 1.2 mg/dL 0.6 0.7 0.6  Alkaline Phos 38 - 126 U/L 56 54 56  AST 15 - 41 U/L _2 ALT 17 - 63 U/L 15(L) 20 16(L)    PENDING LABS:    DIAGNOSTIC IMAGING:  *The following radiologic  images and reports have been reviewed independently and agree with below findings.  CT chest/abd/pelvis: 08/08/17 CLINICAL DATA:  Distal esophageal and gastric cardia adenocarcinoma, ongoing chemotherapy.  EXAM: CT CHEST, ABDOMEN, AND PELVIS WITH CONTRAST  TECHNIQUE: Multidetector CT imaging of the chest, abdomen and pelvis was performed following the standard protocol during bolus administration of intravenous contrast.  CONTRAST:  187m ISOVUE-300 IOPAMIDOL (ISOVUE-300) INJECTION 61%  COMPARISON:  05/02/2017  FINDINGS: CT CHEST FINDINGS  Cardiovascular: Right Port-A-Cath tip:  Distal SVC.  Mediastinum/Nodes: By subjective assessment there is significant reduction in the enhancing tumor along the lumen of the distal esophagus and gastric cardia. There still some mild irregular polypoid filling defect in this vicinity for example images 41 through 49 of series 3, but compared to the prior exam there was significantly more enhancement along the luminal margin in this vicinity on the previous exam. Accordingly the abnormality in this region is significantly improved but not resolved.  No pathologic thoracic adenopathy.  Lungs/Pleura: The right upper lobe nodule of concern on the prior exam is nearly completely resolved. On image 42/5 of today's exam there is a highly indistinct 2 mm residual density in this vicinity, much reduced in conspicuity compared to the prior exam. No new nodules.  Musculoskeletal: Unremarkable  CT ABDOMEN PELVIS FINDINGS  Hepatobiliary: Gallstones are present within the contracted gallbladder. No appreciable extrahepatic biliary dilatation. No findings of metastatic disease to the liver at this time.  Pancreas: Unremarkable  Spleen: Unremarkable  Adrenals/Urinary Tract: Stable hypodense 1.5 by 1.3 cm exophytic lesion from the left kidney  lower pole with internal fluid density of 15 Hounsfield units.  Stable 1.2 by 0.9 cm  left kidney lower pole lesion on image 74/3, this lesion has higher density characteristics with a density measurement of 34 Hounsfield units on image 104/6, probably a complex cyst but less technically specific and meriting surveillance.  2 mm right kidney upper pole nonobstructive renal calculus and 2 mm left kidney upper pole nonobstructive renal calculus. No hydronephrosis or hydroureter.  Stomach/Bowel: As noted above, there is a reduction in the amount of enhancing tumor along the lumen of the gastric cardia. Prominent stool throughout the colon favors constipation.  Vascular/Lymphatic: Right gastric node 0.8 cm in short axis on image 53/3, stable. Poor definition of fat planes around the celiac chain, stable. Retrocaval node 0.7 cm in short axis on image 59/3, formerly 1.2 cm. Mass or lymph node along the left adrenal gland measures 1.0 cm in short axis on image 60/3, formerly 1.9 cm, and also with significantly reduced overall enhancement compared to prior. Aortocaval node on image 66/ 3 measures 0.7 cm in short axis, formerly 1.3 cm.  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 Mild lumbar spondylosis and degenerative disc disease causing mild degrees of impingement at L4-5 at L5-S1. Small lipoma interposed between the left inferior rib cage and the left upper rectus abdominus muscle, unchanged.  IMPRESSION: 1. The dominant trend on today's exam is further reduction in size of the gastroesophageal mass, and significant reduction in the surrounding upper abdominal adenopathy. 2. The small right upper lobe nodule of concern on the prior exam has nearly completely resolved. 3. Bilateral nonobstructive nephrolithiasis. There is a small complex lesion of the left kidney lower pole which is stable and probably complex cysts but which may merit surveillance. 4. Other  imaging findings of potential clinical significance: Cholelithiasis. Trace free pelvic fluid. Mild lower lumbar impingement. Fused sacroiliac joints.   Electronically Signed   By: Van Clines M.D.   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 Oneida Alar.  PET scan 01/19/17 revealed primary malignancy involving distal esophagus and gastric cardia, with nodes at base of right neck most consistent with metastatic disease; also metastatic nodes in upper abdomen. Underwent (R) supraclavicular lymph node biopsy on 01/16/17 revealing metastatic poorly differentiated carcinoma; HER2-.  He did not undergo EUS prior to treatment.  Started systemic treatment with FOLFOX chemotherapy from 02/15/17-04/25/17.  Restaging scans in 04/2017 showed residual tumor in the distal esophagus and gastric cardia, without significant change in hypermetabolic lymphadenopathy.  Given sub-optimal response, treatment was changed to Taxol/Cyramza on 05/18/17. Restaging CT chest/abd/pelvis on 08/08/17 showed that the dominant trend was further reduction in size of gastroesophageal mass, and with significant reduction in surrounding upper abdominal adenopathy; previously seen right upper lobe nodule of concern on previous exam has completely resolved.    -Due for next cycle Taxol/Cyramza today. Given his grade 2 peripheral neuropathy (see below), will hold Taxol today and proceed with Cyramza alone. Labs/UA reviewed and are adequate for Cyramza treatment today.  -Will plan to restage in ~3 months; will order scans at subsequent follow-up visits.  -Return to cancer center in  1 week for follow-up with MD to see if peripheral neuropathy is improved for day 8 Taxol.  Peripheral neuropathy:  -Persistent  peripheral neuropathy/numbness to bilateral mid-foot to toes; has been taking gabapentin 2-3 times/day for the past month or so with only modest improvement in his symptoms. Currently ~grade 2 neuropathy.  Discussed with Dr. Sherrine Maples today. She recommends we hold Taxol dose today and proceed with Cyramza alone.  Encouraged him to continue his gabapentin and we will re-evaluate when he returns next week for next dose of Taxol (day 8) for this cycle of therapy.    Hypertension:  -BP improved from previous and is 130s/90s today. Recent worsening hypertension could be d/t Cyramza.  -Discussed with him his concerns re: Losartan recall. Encouraged him to take his medication to his pharmacy to see if the lot # of his medication was affected.  -Continue HCTZ as prescribed. If BP continues to improve, then can prescribe combination Cozaar/HCTZ medication in 1 pill.   Iron deficiency anemia:  -Likely from esophageal malignancy/ulceration.  -s/p 2 doses of Feraheme on 03/02/17 & 03/09/17 for low ferritin of 9 and low iron saturations of 9%.  Received an additional dose of IV Feraheme on 04/25/17. -Hgb mildly low at 12.9 g/dL today. Clinically, he is largely asymptomatic. Will keep monitoring as clinically indicated.        Dispo:  -Hold Taxol today. Proceed with Cyramza today as scheduled.  -Return to cancer center and follow-up visit with next dose of Taxol (to see if his symptoms improved with holding Taxol today).      All questions were answered to patient's stated satisfaction. Encouraged patient to call with any new concerns or questions before his next visit to the cancer center and we can certain see him sooner, if needed.    Plan of care discussed with Dr. Sherrine Maples, who agrees with above aforementioned.   Orders placed this encounter:  No orders of the defined types were placed in this encounter.     [name], NP [place] [phone]

## 2017-09-07 NOTE — Patient Instructions (Addendum)
[facility] Discharge Instructions for Patients Receiving Chemotherapy   Beginning January 23rd 2017 lab work for the [facility] will be done in the  Main lab at [facility] on 1st floor. If you have a lab appointment with the [facility] please come in thru the  Main Entrance and check in at the main information desk   Today you received the following chemotherapy agent Cyramza. Follow-up as scheduled. Call clinic for any questions or concerns  To help prevent nausea and vomiting after your treatment, we encourage you to take your nausea medication   If you develop nausea and vomiting, or diarrhea that is not controlled by your medication, call the clinic.  The clinic phone number is [phone]. Office hours are Monday-Friday 8:30am-5:00pm.  BELOW ARE SYMPTOMS THAT SHOULD BE REPORTED IMMEDIATELY:  *FEVER GREATER THAN 101.0 F  *CHILLS WITH OR WITHOUT FEVER  NAUSEA AND VOMITING THAT IS NOT CONTROLLED WITH YOUR NAUSEA MEDICATION  *UNUSUAL SHORTNESS OF BREATH  *UNUSUAL BRUISING OR BLEEDING  TENDERNESS IN MOUTH AND THROAT WITH OR WITHOUT PRESENCE OF ULCERS  *URINARY PROBLEMS  *BOWEL PROBLEMS  UNUSUAL RASH Items with * indicate a potential emergency and should be followed up as soon as possible. If you have an emergency after office hours please contact your primary care physician or go to the nearest emergency department.  Please call the clinic during office hours if you have any questions or concerns.   You may also contact the Patient Navigator at [phone] should you have any questions or need assistance in obtaining follow up care.      Resources For Cancer Patients and their Caregivers ? American Cancer Society: Can assist with transportation, wigs, general needs, runs Look Good Feel Better.        669-804-3165 ? Cancer Care: Provides financial assistance, online support groups, medication/co-pay assistance.  1-800-813-HOPE  786-056-9144) ? [place] Assists [place] cancer patients and their families through emotional , educational and financial support.  (930) 103-9354 ? [place] DSS Where to apply for food stamps, Medicaid and utility assistance. (406)130-5494 ? [facility]: Transportation to medical appointments. (207) 561-8963 ? Social Security Administration: May apply for disability if have a Stage IV cancer. ([phone] [phone] ? [employer], Disability and Transit Services: Assists with nutrition, care and transit needs. ([phone]

## 2017-09-08 ENCOUNTER — Encounter (HOSPITAL_COMMUNITY): Payer: Self-pay | Admitting: Adult Health

## 2017-09-10 ENCOUNTER — Other Ambulatory Visit (HOSPITAL_COMMUNITY): Payer: Self-pay | Admitting: Adult Health

## 2017-09-10 DIAGNOSIS — I158 Other secondary hypertension: Secondary | ICD-10-CM

## 2017-09-10 DIAGNOSIS — C159 Malignant neoplasm of esophagus, unspecified: Secondary | ICD-10-CM

## 2017-09-14 ENCOUNTER — Inpatient Hospital Stay (HOSPITAL_COMMUNITY): Payer: Managed Care, Other (non HMO) | Attending: Oncology

## 2017-09-14 ENCOUNTER — Encounter (HOSPITAL_COMMUNITY): Payer: Self-pay

## 2017-09-14 ENCOUNTER — Ambulatory Visit (HOSPITAL_COMMUNITY): Payer: Managed Care, Other (non HMO) | Admitting: Internal Medicine

## 2017-09-14 ENCOUNTER — Encounter: Payer: Self-pay | Admitting: Oncology

## 2017-09-14 VITALS — BP 134/103 | HR 88 | Temp 98.6°F | Resp 18 | Wt 216.4 lb

## 2017-09-14 DIAGNOSIS — Z5111 Encounter for antineoplastic chemotherapy: Secondary | ICD-10-CM | POA: Diagnosis present

## 2017-09-14 DIAGNOSIS — Z79899 Other long term (current) drug therapy: Secondary | ICD-10-CM | POA: Diagnosis not present

## 2017-09-14 DIAGNOSIS — C770 Secondary and unspecified malignant neoplasm of lymph nodes of head, face and neck: Secondary | ICD-10-CM | POA: Insufficient documentation

## 2017-09-14 DIAGNOSIS — C155 Malignant neoplasm of lower third of esophagus: Secondary | ICD-10-CM | POA: Insufficient documentation

## 2017-09-14 DIAGNOSIS — C159 Malignant neoplasm of esophagus, unspecified: Secondary | ICD-10-CM

## 2017-09-14 DIAGNOSIS — C160 Malignant neoplasm of cardia: Secondary | ICD-10-CM | POA: Diagnosis not present

## 2017-09-14 DIAGNOSIS — Z5112 Encounter for antineoplastic immunotherapy: Secondary | ICD-10-CM | POA: Diagnosis present

## 2017-09-14 LAB — COMPREHENSIVE METABOLIC PANEL
ALT: 17 U/L (ref 17–63)
AST: 20 U/L (ref 15–41)
Albumin: 4.4 g/dL (ref 3.5–5.0)
Alkaline Phosphatase: 51 U/L (ref 38–126)
Anion gap: 10 (ref 5–15)
BUN: 10 mg/dL (ref 6–20)
CHLORIDE: 100 mmol/L — AB (ref 101–111)
CO2: 24 mmol/L (ref 22–32)
CREATININE: 1 mg/dL (ref 0.61–1.24)
Calcium: 9.2 mg/dL (ref 8.9–10.3)
GFR calc Af Amer: >60 mL/min (ref >60)
GFR calc non Af Amer: >60 mL/min (ref >60)
Glucose, Bld: 136 mg/dL — ABNORMAL HIGH (ref 65–99)
POTASSIUM: 3.4 mmol/L — AB (ref 3.5–5.1)
SODIUM: 134 mmol/L — AB (ref 135–145)
Total Bilirubin: 0.9 mg/dL (ref 0.3–1.2)
Total Protein: 7.6 g/dL (ref 6.5–8.1)

## 2017-09-14 LAB — CBC WITH DIFFERENTIAL/PLATELET
Basophils Absolute: 0 10*3/uL (ref 0.0–0.1)
Basophils Relative: 1 %
EOS ABS: 0.1 10*3/uL (ref 0.0–0.7)
EOS PCT: 1 %
HCT: 42.8 % (ref 39.0–52.0)
HEMOGLOBIN: 14.3 g/dL (ref 13.0–17.0)
LYMPHS ABS: 2.5 10*3/uL (ref 0.7–4.0)
LYMPHS PCT: 31 %
MCH: 26.6 pg (ref 26.0–34.0)
MCHC: 33.4 g/dL (ref 30.0–36.0)
MCV: 79.6 fL (ref 78.0–100.0)
MONOS PCT: 7 %
Monocytes Absolute: 0.6 10*3/uL (ref 0.1–1.0)
NEUTROS PCT: 60 %
Neutro Abs: 4.8 10*3/uL (ref 1.7–7.7)
Platelets: 245 10*3/uL (ref 150–400)
RBC: 5.38 MIL/uL (ref 4.22–5.81)
RDW: 14.9 % (ref 11.5–15.5)
WBC: 7.9 10*3/uL (ref 4.0–10.5)

## 2017-09-14 LAB — TSH: TSH: 1.172 u[IU]/mL (ref 0.350–4.500)

## 2017-09-14 LAB — LACTATE DEHYDROGENASE: LDH: 129 U/L (ref 98–192)

## 2017-09-14 MED ORDER — HEPARIN SOD (PORK) LOCK FLUSH 100 UNIT/ML IV SOLN
500.0000 [IU] | Freq: Once | INTRAVENOUS | Status: AC | PRN
  Administered 2017-09-14: 500 [IU]
  Filled 2017-09-14: qty 5

## 2017-09-14 MED ORDER — SODIUM CHLORIDE 0.9 % IV SOLN
Freq: Once | INTRAVENOUS | Status: AC
  Administered 2017-09-14: 11:00:00 via INTRAVENOUS

## 2017-09-14 MED ORDER — DEXTROSE 5 % IV SOLN
80.0000 mg/m2 | Freq: Once | INTRAVENOUS | Status: AC
  Administered 2017-09-14: 174 mg via INTRAVENOUS
  Filled 2017-09-14: qty 29

## 2017-09-14 MED ORDER — SODIUM CHLORIDE 0.9% FLUSH
10.0000 mL | INTRAVENOUS | Status: DC | PRN
  Administered 2017-09-14: 10 mL
  Filled 2017-09-14: qty 10

## 2017-09-14 MED ORDER — DIPHENHYDRAMINE HCL 50 MG/ML IJ SOLN
INTRAMUSCULAR | Status: AC
  Filled 2017-09-14: qty 1

## 2017-09-14 MED ORDER — DEXAMETHASONE SODIUM PHOSPHATE 10 MG/ML IJ SOLN
10.0000 mg | Freq: Once | INTRAMUSCULAR | Status: AC
  Administered 2017-09-14: 10 mg via INTRAVENOUS

## 2017-09-14 MED ORDER — FAMOTIDINE IN NACL 20-0.9 MG/50ML-% IV SOLN
INTRAVENOUS | Status: AC
  Filled 2017-09-14: qty 50

## 2017-09-14 MED ORDER — DEXAMETHASONE SODIUM PHOSPHATE 10 MG/ML IJ SOLN
INTRAMUSCULAR | Status: AC
  Filled 2017-09-14: qty 1

## 2017-09-14 MED ORDER — DIPHENHYDRAMINE HCL 50 MG/ML IJ SOLN
50.0000 mg | Freq: Once | INTRAMUSCULAR | Status: AC
  Administered 2017-09-14: 50 mg via INTRAVENOUS

## 2017-09-14 MED ORDER — FAMOTIDINE IN NACL 20-0.9 MG/50ML-% IV SOLN
20.0000 mg | Freq: Once | INTRAVENOUS | Status: AC
  Administered 2017-09-14: 20 mg via INTRAVENOUS

## 2017-09-14 MED ORDER — SODIUM CHLORIDE 0.9 % IV SOLN: 10.0000 mg | Freq: Once | INTRAVENOUS | Status: DC

## 2017-09-14 NOTE — Progress Notes (Signed)
Patient for treatment today.  Stated he is currently taking 2 blood pressure medications.  Medications reviewed.  Patient stated his toe neuropathy is better from last week.  Taking his gabapentin BID as directed.  Good appetite and energy level.  Able to perform ADL's with no complaints voiced.    Labs reviewed and patients neuropathy reviewed with Faythe Casa, NP, and ok to treat today.  Patient tolerated chemotherapy with no complaints voiced.  Port site clean and dry with no bruising or swelling noted at site.  Band aid applied.  Vital signs reviewed with Faythe Casa, NP and ok to discharge the patient home.   Patient ambulated with wife at side with no s/s of distress noted.

## 2017-09-14 NOTE — Progress Notes (Signed)
Blood pressure 134/103, Faythe Casa NP notified and no changes at this time. Will discharge patient at this time per NP. Patient denies any headache or dizziness, no complaints at this time. Encouraged patient to recheck blood pressure when he got home later.

## 2017-09-14 NOTE — Patient Instructions (Signed)
[facility] Discharge Instructions for Patients Receiving Chemotherapy  Today you received the following chemotherapy agents taxol today.  Call for any questions or concerns.    If you develop nausea and vomiting that is not controlled by your nausea medication, call the clinic.   BELOW ARE SYMPTOMS THAT SHOULD BE REPORTED IMMEDIATELY:  *FEVER GREATER THAN 100.5 F  *CHILLS WITH OR WITHOUT FEVER  NAUSEA AND VOMITING THAT IS NOT CONTROLLED WITH YOUR NAUSEA MEDICATION  *UNUSUAL SHORTNESS OF BREATH  *UNUSUAL BRUISING OR BLEEDING  TENDERNESS IN MOUTH AND THROAT WITH OR WITHOUT PRESENCE OF ULCERS  *URINARY PROBLEMS  *BOWEL PROBLEMS  UNUSUAL RASH Items with * indicate a potential emergency and should be followed up as soon as possible.  Feel free to call the clinic should you have any questions or concerns. The clinic phone number is [phone].  Please show the [facility] at check-in to the Emergency Department and triage nurse.

## 2017-09-21 ENCOUNTER — Encounter (HOSPITAL_COMMUNITY): Payer: Self-pay

## 2017-09-21 ENCOUNTER — Inpatient Hospital Stay (HOSPITAL_COMMUNITY): Payer: Managed Care, Other (non HMO)

## 2017-09-21 VITALS — BP 150/97 | HR 71 | Temp 97.9°F | Resp 18 | Wt 216.2 lb

## 2017-09-21 DIAGNOSIS — Z5112 Encounter for antineoplastic immunotherapy: Secondary | ICD-10-CM | POA: Diagnosis not present

## 2017-09-21 DIAGNOSIS — C159 Malignant neoplasm of esophagus, unspecified: Secondary | ICD-10-CM

## 2017-09-21 LAB — COMPREHENSIVE METABOLIC PANEL
ALBUMIN: 4 g/dL (ref 3.5–5.0)
ALK PHOS: 50 U/L (ref 38–126)
ALT: 18 U/L (ref 17–63)
ANION GAP: 8 (ref 5–15)
AST: 19 U/L (ref 15–41)
BILIRUBIN TOTAL: 0.7 mg/dL (ref 0.3–1.2)
BUN: 14 mg/dL (ref 6–20)
CALCIUM: 9.1 mg/dL (ref 8.9–10.3)
CO2: 26 mmol/L (ref 22–32)
Chloride: 101 mmol/L (ref 101–111)
Creatinine, Ser: 1.02 mg/dL (ref 0.61–1.24)
GFR calc Af Amer: >60 mL/min (ref >60)
GFR calc non Af Amer: >60 mL/min (ref >60)
GLUCOSE: 127 mg/dL — AB (ref 65–99)
Potassium: 3.7 mmol/L (ref 3.5–5.1)
SODIUM: 135 mmol/L (ref 135–145)
TOTAL PROTEIN: 7.1 g/dL (ref 6.5–8.1)

## 2017-09-21 LAB — CBC WITH DIFFERENTIAL/PLATELET
Basophils Absolute: 0 10*3/uL (ref 0.0–0.1)
Basophils Relative: 0 %
EOS PCT: 1 %
Eosinophils Absolute: 0.1 10*3/uL (ref 0.0–0.7)
HCT: 38.9 % — ABNORMAL LOW (ref 39.0–52.0)
Hemoglobin: 13 g/dL (ref 13.0–17.0)
LYMPHS PCT: 33 %
Lymphs Abs: 2.3 10*3/uL (ref 0.7–4.0)
MCH: 26.1 pg (ref 26.0–34.0)
MCHC: 33.4 g/dL (ref 30.0–36.0)
MCV: 78.1 fL (ref 78.0–100.0)
MONO ABS: 0.3 10*3/uL (ref 0.1–1.0)
MONOS PCT: 4 %
Neutro Abs: 4.2 10*3/uL (ref 1.7–7.7)
Neutrophils Relative %: 62 %
PLATELETS: 209 10*3/uL (ref 150–400)
RBC: 4.98 MIL/uL (ref 4.22–5.81)
RDW: 14.9 % (ref 11.5–15.5)
WBC: 6.8 10*3/uL (ref 4.0–10.5)

## 2017-09-21 LAB — URINALYSIS, DIPSTICK ONLY
Bilirubin Urine: NEGATIVE
GLUCOSE, UA: NEGATIVE mg/dL
HGB URINE DIPSTICK: NEGATIVE
KETONES UR: NEGATIVE mg/dL
LEUKOCYTES UA: NEGATIVE
Nitrite: NEGATIVE
PH: 6 (ref 5.0–8.0)
PROTEIN: NEGATIVE mg/dL
Specific Gravity, Urine: 1.018 (ref 1.005–1.030)

## 2017-09-21 LAB — TSH: TSH: 1.642 u[IU]/mL (ref 0.350–4.500)

## 2017-09-21 LAB — LACTATE DEHYDROGENASE: LDH: 114 U/L (ref 98–192)

## 2017-09-21 MED ORDER — DIPHENHYDRAMINE HCL 50 MG/ML IJ SOLN
INTRAMUSCULAR | Status: AC
  Filled 2017-09-21: qty 1

## 2017-09-21 MED ORDER — SODIUM CHLORIDE 0.9 % IV SOLN
Freq: Once | INTRAVENOUS | Status: AC
  Administered 2017-09-21: 11:00:00 via INTRAVENOUS

## 2017-09-21 MED ORDER — DIPHENHYDRAMINE HCL 50 MG/ML IJ SOLN
50.0000 mg | Freq: Once | INTRAMUSCULAR | Status: AC
  Administered 2017-09-21: 50 mg via INTRAVENOUS

## 2017-09-21 MED ORDER — HEPARIN SOD (PORK) LOCK FLUSH 100 UNIT/ML IV SOLN
500.0000 [IU] | Freq: Once | INTRAVENOUS | Status: AC | PRN
  Administered 2017-09-21: 500 [IU]

## 2017-09-21 MED ORDER — RAMUCIRUMAB CHEMO INJECTION 500 MG/50ML
8.0000 mg/kg | Freq: Once | INTRAVENOUS | Status: AC
  Administered 2017-09-21: 800 mg via INTRAVENOUS
  Filled 2017-09-21: qty 50

## 2017-09-21 MED ORDER — SODIUM CHLORIDE 0.9% FLUSH
10.0000 mL | INTRAVENOUS | Status: DC | PRN
  Administered 2017-09-21: 10 mL
  Filled 2017-09-21: qty 10

## 2017-09-21 NOTE — Treatment Plan (Signed)
Paclitaxel permanently dc'd by Dr Bangladesh.

## 2017-09-21 NOTE — Patient Instructions (Signed)
[facility] Discharge Instructions for Patients Receiving Chemotherapy   Beginning January 23rd 2017 lab work for the Cancer Center will be done in the  Main lab at  on 1st floor. If you have a lab appointment with the Cancer Center please come in thru the  Main Entrance and check in at the main information desk   Today you received the following chemotherapy agents Cyramza. Follow-up as scheduled. Call clinic for any questions or concerns  To help prevent nausea and vomiting after your treatment, we encourage you to take your nausea medication   If you develop nausea and vomiting, or diarrhea that is not controlled by your medication, call the clinic.  The clinic phone number is [phone]. Office hours are Monday-Friday 8:30am-5:00pm.  BELOW ARE SYMPTOMS THAT SHOULD BE REPORTED IMMEDIATELY:  *FEVER GREATER THAN 101.0 F  *CHILLS WITH OR WITHOUT FEVER  NAUSEA AND VOMITING THAT IS NOT CONTROLLED WITH YOUR NAUSEA MEDICATION  *UNUSUAL SHORTNESS OF BREATH  *UNUSUAL BRUISING OR BLEEDING  TENDERNESS IN MOUTH AND THROAT WITH OR WITHOUT PRESENCE OF ULCERS  *URINARY PROBLEMS  *BOWEL PROBLEMS  UNUSUAL RASH Items with * indicate a potential emergency and should be followed up as soon as possible. If you have an emergency after office hours please contact your primary care physician or go to the nearest emergency department.  Please call the clinic during office hours if you have any questions or concerns.   You may also contact the Patient Navigator at [phone] should you have any questions or need assistance in obtaining follow up care.      Resources For Cancer Patients and their Caregivers ? American Cancer Society: Can assist with transportation, wigs, general needs, runs Look Good Feel Better.        1-888-227-6333 ? Cancer Care: Provides financial assistance, online support groups, medication/co-pay assistance.  1-800-813-HOPE  (4673) ? [facility] Assists [place] cancer patients and their families through emotional , educational and financial support.  336-427-4357 ? [place] DSS Where to apply for food stamps, Medicaid and utility assistance. 336-342-1394 ? [facility]: Transportation to medical appointments. 336-347-2287 ? Social Security Administration: May apply for disability if have a Stage IV cancer. 336-342-7796 1-800-772-1213 ? [place] Aging, Disability and Transit Services: Assists with nutrition, care and transit needs. 336-349-2343         

## 2017-09-21 NOTE — Progress Notes (Signed)
I was asked to see the patient because of complaints of numbness in both his feet.  He is scheduled for day 15 Taxol and Cyramza today.  For metastatic esophageal adenocarcinoma. Patient is doing well except that his symptoms of numbness on the dorsum of his feet from extend into the toes and to the bottom of his feet this has been going on for several weeks but it has worsened over the last 2-3 weeks to the point where now he feels like he is walking on "bricks". He also feels afraid that he might fall.  He is worried that as this gets worse he may not recognize injuries to his feet.  And he wants to address this early on. He does not have any symptoms in his hands.  He has no pain or burning. He does not have any issues with his back.  Plan is to permanently discontinue Taxol and continue with single agent Cyramza days 1 and 14 q. 28 days cycle.  His last CT scans from December showed excellent response.  Repeat CT scan will be due sometime in March 2019. He can continue single agent Cyramza for as long as he shows a response.  His pathology report indicates HER-2 negativity.  He has previously shown inadequate response to first-line FOLFOX. I would request path to add PDL 1 testing on the tissue to prepare for next line therapy Meanwhile I prescribed vitamin B6 and alpha lipoid acid.  He will continue gabapentin as needed. Explained the plan to him he understands and he agrees.

## 2017-09-21 NOTE — Progress Notes (Signed)
1030 Lab and urine results reviewed with and pt seen by Dr. Sherrine Maples. 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 Pt tolerated Cyramza infusion today well without complaints or incident. VSS upon discharge. Pt discharged self ambulatory in satisfactory condition accompanied by his wife

## 2017-09-28 ENCOUNTER — Telehealth (HOSPITAL_COMMUNITY): Payer: Self-pay

## 2017-09-28 ENCOUNTER — Other Ambulatory Visit (HOSPITAL_COMMUNITY): Payer: Self-pay | Admitting: Adult Health

## 2017-09-28 DIAGNOSIS — C159 Malignant neoplasm of esophagus, unspecified: Secondary | ICD-10-CM

## 2017-09-28 DIAGNOSIS — G6289 Other specified polyneuropathies: Secondary | ICD-10-CM

## 2017-09-28 MED ORDER — ALPHA-LIPOIC ACID 600 MG PO CAPS: 1.0000 | ORAL_CAPSULE | Freq: Every day | ORAL | 11 refills | Status: DC

## 2017-09-28 MED ORDER — B COMPLEX-C PO TABS: 1.0000 | ORAL_TABLET | Freq: Every day | ORAL | 11 refills | Status: DC

## 2017-09-28 NOTE — Progress Notes (Signed)
Rx sent to [facility]   gwd

## 2017-09-28 NOTE — Telephone Encounter (Signed)
Patient called stating he has not been able to get the "vitamins" Dr. Bangladesh was going to prescribe for his neuropathy. He states [facility] does not have the prescriptions. Reviewed last note with Elzie Rings, NP. She sent in prescriptions for B complex and ALA for patient. Notified patient that scripts had been sent to [facility] in [place] with understanding verbalized.

## 2017-10-05 ENCOUNTER — Encounter (HOSPITAL_COMMUNITY): Payer: Self-pay

## 2017-10-05 ENCOUNTER — Other Ambulatory Visit: Payer: Self-pay

## 2017-10-05 ENCOUNTER — Inpatient Hospital Stay (HOSPITAL_BASED_OUTPATIENT_CLINIC_OR_DEPARTMENT_OTHER): Payer: Managed Care, Other (non HMO) | Admitting: Internal Medicine

## 2017-10-05 ENCOUNTER — Inpatient Hospital Stay (HOSPITAL_COMMUNITY): Payer: Managed Care, Other (non HMO)

## 2017-10-05 VITALS — BP 126/92 | HR 78 | Temp 98.7°F | Resp 16 | Wt 217.6 lb

## 2017-10-05 DIAGNOSIS — C155 Malignant neoplasm of lower third of esophagus: Secondary | ICD-10-CM

## 2017-10-05 DIAGNOSIS — I10 Essential (primary) hypertension: Secondary | ICD-10-CM

## 2017-10-05 DIAGNOSIS — C160 Malignant neoplasm of cardia: Secondary | ICD-10-CM | POA: Diagnosis not present

## 2017-10-05 DIAGNOSIS — C159 Malignant neoplasm of esophagus, unspecified: Secondary | ICD-10-CM

## 2017-10-05 DIAGNOSIS — Z5112 Encounter for antineoplastic immunotherapy: Secondary | ICD-10-CM | POA: Diagnosis not present

## 2017-10-05 DIAGNOSIS — C770 Secondary and unspecified malignant neoplasm of lymph nodes of head, face and neck: Secondary | ICD-10-CM

## 2017-10-05 DIAGNOSIS — D509 Iron deficiency anemia, unspecified: Secondary | ICD-10-CM | POA: Diagnosis not present

## 2017-10-05 LAB — COMPREHENSIVE METABOLIC PANEL
ALT: 16 U/L — ABNORMAL LOW (ref 17–63)
ANION GAP: 12 (ref 5–15)
AST: 21 U/L (ref 15–41)
Albumin: 4.1 g/dL (ref 3.5–5.0)
Alkaline Phosphatase: 49 U/L (ref 38–126)
BILIRUBIN TOTAL: 0.4 mg/dL (ref 0.3–1.2)
BUN: 10 mg/dL (ref 6–20)
CHLORIDE: 102 mmol/L (ref 101–111)
CO2: 22 mmol/L (ref 22–32)
Calcium: 9.2 mg/dL (ref 8.9–10.3)
Creatinine, Ser: 1.08 mg/dL (ref 0.61–1.24)
Glucose, Bld: 148 mg/dL — ABNORMAL HIGH (ref 65–99)
Potassium: 3.5 mmol/L (ref 3.5–5.1)
Sodium: 136 mmol/L (ref 135–145)
TOTAL PROTEIN: 7.4 g/dL (ref 6.5–8.1)

## 2017-10-05 LAB — CBC WITH DIFFERENTIAL/PLATELET
Basophils Absolute: 0 10*3/uL (ref 0.0–0.1)
Basophils Relative: 0 %
EOS ABS: 0.1 10*3/uL (ref 0.0–0.7)
Eosinophils Relative: 2 %
HEMATOCRIT: 40.6 % (ref 39.0–52.0)
HEMOGLOBIN: 13.8 g/dL (ref 13.0–17.0)
LYMPHS ABS: 2.6 10*3/uL (ref 0.7–4.0)
LYMPHS PCT: 39 %
MCH: 26.7 pg (ref 26.0–34.0)
MCHC: 34 g/dL (ref 30.0–36.0)
MCV: 78.5 fL (ref 78.0–100.0)
MONOS PCT: 5 %
Monocytes Absolute: 0.3 10*3/uL (ref 0.1–1.0)
NEUTROS ABS: 3.6 10*3/uL (ref 1.7–7.7)
NEUTROS PCT: 54 %
Platelets: 197 10*3/uL (ref 150–400)
RBC: 5.17 MIL/uL (ref 4.22–5.81)
RDW: 14.9 % (ref 11.5–15.5)
WBC: 6.6 10*3/uL (ref 4.0–10.5)

## 2017-10-05 LAB — URINALYSIS, DIPSTICK ONLY
BILIRUBIN URINE: NEGATIVE
Glucose, UA: NEGATIVE mg/dL
Hgb urine dipstick: NEGATIVE
KETONES UR: NEGATIVE mg/dL
Leukocytes, UA: NEGATIVE
NITRITE: NEGATIVE
Protein, ur: NEGATIVE mg/dL
SPECIFIC GRAVITY, URINE: 1.019 (ref 1.005–1.030)
pH: 5 (ref 5.0–8.0)

## 2017-10-05 LAB — TSH: TSH: 1.337 u[IU]/mL (ref 0.350–4.500)

## 2017-10-05 LAB — LACTATE DEHYDROGENASE: LDH: 111 U/L (ref 98–192)

## 2017-10-05 MED ORDER — HEPARIN SOD (PORK) LOCK FLUSH 100 UNIT/ML IV SOLN
500.0000 [IU] | Freq: Once | INTRAVENOUS | Status: AC | PRN
  Administered 2017-10-05: 500 [IU]
  Filled 2017-10-05: qty 5

## 2017-10-05 MED ORDER — DIPHENHYDRAMINE HCL 50 MG/ML IJ SOLN
INTRAMUSCULAR | Status: AC
  Filled 2017-10-05: qty 1

## 2017-10-05 MED ORDER — SODIUM CHLORIDE 0.9 % IV SOLN
Freq: Once | INTRAVENOUS | Status: AC
  Administered 2017-10-05: 11:00:00 via INTRAVENOUS

## 2017-10-05 MED ORDER — SODIUM CHLORIDE 0.9 % IV SOLN
8.0000 mg/kg | Freq: Once | INTRAVENOUS | Status: AC
  Administered 2017-10-05: 800 mg via INTRAVENOUS
  Filled 2017-10-05: qty 50

## 2017-10-05 MED ORDER — SODIUM CHLORIDE 0.9% FLUSH
10.0000 mL | INTRAVENOUS | Status: DC | PRN
  Administered 2017-10-05: 10 mL
  Filled 2017-10-05: qty 10

## 2017-10-05 MED ORDER — DIPHENHYDRAMINE HCL 50 MG/ML IJ SOLN
50.0000 mg | Freq: Once | INTRAMUSCULAR | Status: AC
  Administered 2017-10-05: 50 mg via INTRAVENOUS

## 2017-10-05 NOTE — Patient Instructions (Addendum)
[place] at [facility] Discharge Instructions  RECOMMENDATIONS MADE BY THE CONSULTANT AND ANY TEST RESULTS WILL BE SENT TO YOUR REFERRING PHYSICIAN.  You were seen today by Dr. Zoila Shutter You will get treatment today  Continue treatments every 2 weeks Follow up with the new physician in March   Thank you for choosing [place] at [facility] to provide your oncology and hematology care.  To afford each patient quality time with our provider, please arrive at least 15 minutes before your scheduled appointment time.    If you have a lab appointment with the [facility] please come in thru the  Main Entrance and check in at the main information desk  You need to re-schedule your appointment should you arrive 10 or more minutes late.  We strive to give you quality time with our providers, and arriving late affects you and other patients whose appointments are after yours.  Also, if you no show three or more times for appointments you may be dismissed from the clinic at the providers discretion.     Again, thank you for choosing [facility].  Our hope is that these requests will decrease the amount of time that you wait before being seen by our physicians.       _____________________________________________________________  Should you have questions after your visit to [facility], please contact our office at [phone] between the hours of 8:30 a.m. and 4:30 p.m.  Voicemails left after 4:30 p.m. will not be returned until the following business day.  For prescription refill requests, have your pharmacy contact our office.       Resources For Cancer Patients and their Caregivers ? American Cancer Society: Can assist with transportation, wigs, general needs, runs Look Good Feel Better.        819-738-4792 ? Cancer Care: Provides financial assistance, online support groups, medication/co-pay assistance.   1-800-813-HOPE 863-206-9156) ? [facility] Assists [place] cancer patients and their families through emotional , educational and financial support.  (904)398-4922 ? [place] DSS Where to apply for food stamps, Medicaid and utility assistance. 380-176-8160 ? [employer]: Transportation to medical appointments. 636-128-4605 ? Social Security Administration: May apply for disability if have a Stage IV cancer. 907-283-2921 418-281-0660 ? [employer], Disability and Transit Services: Assists with nutrition, care and transit needs. [place] Support Programs: @10RELATIVEDAYS @ > Cancer Support Group  2nd Tuesday of the month 1pm-2pm, Journey Room  > Creative Journey  3rd Tuesday of the month 1130am-1pm, Journey Room  > Look Good Feel Better  1st Wednesday of the month 10am-12 noon, Journey Room (Call [place] to register [phone])

## 2017-10-05 NOTE — Progress Notes (Signed)
Labs reviewed with MD, proceed with treatment.  Treatment given per orders. Patient tolerated it well without problems. Vitals stable and discharged home from clinic ambulatory. Follow up as scheduled.  

## 2017-10-05 NOTE — Patient Instructions (Signed)
[facility] Discharge Instructions for Patients Receiving Chemotherapy   Beginning January 23rd 2017 lab work for the Cancer Center will be done in the  Main lab at [facility] on 1st floor. If you have a lab appointment with the Cancer Center please come in thru the  Main Entrance and check in at the main information desk   Today you received the following chemotherapy agents   To help prevent nausea and vomiting after your treatment, we encourage you to take your nausea medication     If you develop nausea and vomiting, or diarrhea that is not controlled by your medication, call the clinic.  The clinic phone number is [phone]. Office hours are Monday-Friday 8:30am-5:00pm.  BELOW ARE SYMPTOMS THAT SHOULD BE REPORTED IMMEDIATELY:  *FEVER GREATER THAN 101.0 F  *CHILLS WITH OR WITHOUT FEVER  NAUSEA AND VOMITING THAT IS NOT CONTROLLED WITH YOUR NAUSEA MEDICATION  *UNUSUAL SHORTNESS OF BREATH  *UNUSUAL BRUISING OR BLEEDING  TENDERNESS IN MOUTH AND THROAT WITH OR WITHOUT PRESENCE OF ULCERS  *URINARY PROBLEMS  *BOWEL PROBLEMS  UNUSUAL RASH Items with * indicate a potential emergency and should be followed up as soon as possible. If you have an emergency after office hours please contact your primary care physician or go to the nearest emergency department.  Please call the clinic during office hours if you have any questions or concerns.   You may also contact the Patient Navigator at [phone] should you have any questions or need assistance in obtaining follow up care.      Resources For Cancer Patients and their Caregivers ? American Cancer Society: Can assist with transportation, wigs, general needs, runs Look Good Feel Better.        1-888-227-6333 ? Cancer Care: Provides financial assistance, online support groups, medication/co-pay assistance.  1-800-813-HOPE (4673) ? [facility] Assists [place] cancer  patients and their families through emotional , educational and financial support.  336-427-4357 ? [place] DSS Where to apply for food stamps, Medicaid and utility assistance. 336-342-1394 ? [employer]: Transportation to medical appointments. 336-347-2287 ? Social Security Administration: May apply for disability if have a Stage IV cancer. 336-342-7796 1-800-772-1213 ? [place] Aging, Disability and Transit Services: Assists with nutrition, care and transit needs. 336-349-2343         

## 2017-10-15 ENCOUNTER — Other Ambulatory Visit (HOSPITAL_COMMUNITY): Payer: Self-pay | Admitting: Adult Health

## 2017-10-15 DIAGNOSIS — C159 Malignant neoplasm of esophagus, unspecified: Secondary | ICD-10-CM

## 2017-10-15 NOTE — Progress Notes (Signed)
Diagnosis No diagnosis found.  Staging Cancer Staging Primary esophageal adenocarcinoma [facility]) Staging form: Esophagus - Adenocarcinoma, AJCC 8th Edition - Clinical stage from 02/11/2017: Stage IVA (cTX, cN3, cM0) - Signed by Baird Cancer, PA-C on 02/11/2017   Assessment and Plan:  1.  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 Oneida Alar.  Pt was followed by Dr. Talbert Cage.  PET scan 01/19/17 revealed primary malignancy involving distal esophagus and gastric cardia, with nodes at base of right neck most consistent with metastatic disease; also metastatic nodes in upper abdomen. Underwent (R) supraclavicular lymph node biopsy on 01/16/17 revealing metastatic poorly differentiated carcinoma; HER2-.  He did not undergo EUS prior to treatment.  Started systemic treatment with FOLFOX chemotherapy on 02/15/17. Patient has completed 6 cycles of FOLFOX to date. His repeat CT C/A/P demonstrated partial response with resolution of his right supraclavicular lymphadenopathy, however he still has residual thickening/hyperenhancement in the distal esophagus/gastric cardia and no change in his abdominal lymphadenopathy.  He was being treated with cyramza with paclitaxel.  On his visit on 09/21/2017 he was complaining of worsening neuropathy and was recommended by Dr. Sherrine Maples to discontinue Taxol.  Last CT scan done 08/08/2017 showed:  IMPRESSION: 1. Further reduction in size of the gastroesophageal mass, and significant reduction in the surrounding upper abdominal adenopathy. 2. The small right upper lobe nodule of concern on the prior exam has nearly completely resolved. 3. Bilateral nonobstructive nephrolithiasis. There is a small complex lesion of the left kidney lower pole which is stable and probably complex cysts but which may merit surveillance. 4. Other imaging findings of potential clinical significance: Cholelithiasis. Trace free pelvic  fluid. Mild lower lumbar impingement. Fused sacroiliac joints.   He will continue Cyramza as recommended and is here for C6.  He will RTC in 1 month for follow-up.  He will be due to repeat imaging in 11/2017.  Labs reviewed.  Counts adequate for chemotherapy.    2.  Iron deficiency anemia.  Felt secondary to esophageal malignancy/ulceration. He has been treated with 2 doses of Feraheme on 03/02/17 & 03/09/17 for low ferritin of 9 . He received 1 dose of feraheme on 04/25/17.  Labs done today HB 13.8.  Will continue to monitor labs as therapy proceeds.    3.  Hypertension.  BP is 126/92.  Continue to follow-up with PCP.       Primary esophageal adenocarcinoma ([place])   01/16/2017 Initial Diagnosis    Primary esophageal adenocarcinoma ([place])      01/16/2017 Procedure    EGD by Dr. Barney Drain. Partially obstructing, likely malignant esophageal tumor was found in the distal esophagus. Biopsied with surgical path demonstrating Adenocarcinoma. Injected. Treated with argon plasma coagulation (APC) #3. 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 Biopsied with surgical path demonstrating Adenocarcinoma.      01/19/2017 PET scan    NECK Two FDG avid nodes are seen in the base of the neck on the right. The first is seen on CT image 35, just posterior to the right thyroid lobe and the other is seen on CT image 41 measuring 16 mm in short axis with a maximum SUV of 8.6.  CHEST  The patient's known malignancy involving the distal esophagus and cardia of the stomach is FDG avid with a maximum SUV of 10.4. No definitive metastatic nodes within the chest.  ABDOMEN/PELVIS  FDG avid metastatic adenopathy is seen in the gastrohepatic ligament, paraceliac nodes,  posterior to the IVC on image 111, and to the left of the SMA on image 112. The most inferior node is seen in the aortocaval region on image 117. The representative node to the left of the SMA was measured on series  4, image 111 measuring 2.5 by 2.1 cm with a maximum SUV of 7. No other FDG avid disease in the abdomen or pelvis. Cholelithiasis is identified.      02/02/2017 Procedure    US guided bx of right supraclavicular LN by IR.      02/05/2017 Pathology Results    Lymph node, needle/core biopsy, Right Supraclavicular - METASTATIC POORLY DIFFERENTIATED CARCINOMA.      02/10/2017 Pathology Results    HER2 - NEGATIVE      02/13/2017 Procedure    Port placed by IR      02/15/2017 - 04/25/2017 Chemotherapy    FOLFOX x 6 cycles       05/02/2017 Imaging    CT C/A/P: IMPRESSION: 1. Nodular thickening and hyperenhancement in the distal esophagus and gastric cardia could reflect residual tumor. 2. No significant change in size of previously demonstrated hypermetabolic upper abdominal lymph nodes. No progressive adenopathy identified. No residual enlarged supraclavicular nodes are seen. 3. No evidence of progressive metastatic disease. No worrisome hepatic findings. 4. Tiny right upper lobe pulmonary nodule, not clearly seen on low dose previous PET-CT. Attention on follow-up recommended. 5. Cholelithiasis.       Problem List Patient Active Problem List   Diagnosis Date Noted  . Iron deficiency anemia due to chronic blood loss [D50.0] 02/28/2017  . Primary esophageal adenocarcinoma ([place]) [C15.9] 01/29/2017  . Dysphagia [R13.10] 01/05/2017    Past Medical History Past Medical History:  Diagnosis Date  . Diabetes mellitus without complication ([place])   . Hypertension   . 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 Primary esophageal adenocarcinoma ([place]) 01/29/2017    Past Surgical History Past Surgical History:  Procedure Laterality Date  . ESOPHAGOGASTRODUODENOSCOPY (EGD) WITH PROPOFOL N/A 01/16/2017   Procedure: ESOPHAGOGASTRODUODENOSCOPY (EGD) WITH PROPOFOL;  Surgeon: [name], MD;  Location: [facility];  Service: Endoscopy;  Laterality: N/A;  730   .  IR FLUORO GUIDE PORT INSERTION RIGHT  02/13/2017  . IR US GUIDE VASC ACCESS RIGHT  02/13/2017  . lipoma removal    . PORTA CATH INSERTION Right 02/13/2017  . SAVORY DILATION N/A 01/16/2017   Procedure: SAVORY DILATION;  Surgeon: [name], MD;  Location: [facility];  Service: Endoscopy;  Laterality: N/A;    Family History Family History  Problem Relation Age of Onset  . Prostate cancer Father   . Colon cancer Neg Hx   . Colon polyps Neg Hx      Social History  reports that he has quit smoking. His smoking use included cigarettes. He smoked 0.25 packs per day. he has never used smokeless tobacco. He reports that he uses drugs. Drug: Marijuana. He reports that he does not drink alcohol.  Medications  Current Outpatient Medications:  .  Alpha-Lipoic Acid 600 MG CAPS, Take 1 capsule (600 mg total) by mouth daily., Disp: 30 each, Rfl: 11 .  B Complex-C (B-COMPLEX WITH VITAMIN C) tablet, Take 1 tablet by mouth daily., Disp: 30 tablet, Rfl: 11 .  colchicine 0.6 MG tablet, Take 1 tablet (0.6 mg total) by mouth daily., Disp: 30 tablet, Rfl: 1 .  dexamethasone (DECADRON) 4 MG tablet, Take 2 tablets (8 mg total) by mouth daily.  Start the day after chemotherapy for 2 days. Take with food., Disp: 30 tablet, Rfl: 1 .  Diphenhyd-Hydrocort-Nystatin (FIRST-DUKES MOUTHWASH) SUSP, Use as directed 5 mLs in the mouth or throat 4 (four) times daily as needed., Disp: 300 mL, Rfl: 1 .  gabapentin (NEURONTIN) 300 MG capsule, Take 1 cap PO qHS for 7 days, then if tolerated increase to 1 cap PO twice a day., Disp: 60 capsule, Rfl: 3 .  hydrochlorothiazide (MICROZIDE) 12.5 MG capsule, TAKE 1 CAPSULE BY MOUTH ONCE DAILY, Disp: 30 capsule, Rfl: 1 .  loratadine (CLARITIN) 10 MG tablet, Take 10 mg by mouth daily., Disp: , Rfl:  .  losartan (COZAAR) 50 MG tablet, Take 1 tablet (50 mg total) by mouth daily., Disp: 30 tablet, Rfl: 1 .  metFORMIN (GLUCOPHAGE) 500 MG tablet, Take 500 mg by mouth daily., Disp: , Rfl:   .  Misc. Devices MISC, Please provide the patient with the Apothecary HPB cough Syrup. 1 teaspoonful every 4-6 hours PRN. Please provide the patient with 360 ml., Disp: 360 each, Rfl: 0 .  Morphine Sulfate (MORPHINE CONCENTRATE) 10 mg / 0.5 ml concentrated solution, Take 0.5 mLs (10 mg total) by mouth every 4 (four) hours as needed for severe pain., Disp: 240 mL, Rfl: 0 .  PACLitaxel (TAXOL IV), Inject into the vein., Disp: , Rfl:  .  pantoprazole (PROTONIX) 40 MG tablet, Take 1 tablet (40 mg total) by mouth 2 (two) times daily. Take 30 minutes before breakfast, Disp: 90 tablet, Rfl: 3 .  polyethylene glycol powder (GLYCOLAX/MIRALAX) powder, Take 1 capful daily., Disp: 255 g, Rfl: 2 .  prochlorperazine (COMPAZINE) 10 MG tablet, TAKE 1 TABLET BY MOUTH EVERY 6 HOURS AS NEEDED FOR NAUSEA AND VOMITING, Disp: 30 tablet, Rfl: 3 .  Ramucirumab (CYRAMZA IV), Inject into the vein., Disp: , Rfl:  .  ranitidine (ZANTAC) 300 MG tablet, Take 1 tablet (300 mg total) by mouth at bedtime., Disp: 60 tablet, Rfl: 1 .  temazepam (RESTORIL) 30 MG capsule, Take 1 capsule (30 mg total) by mouth at bedtime as needed for sleep., Disp: 30 capsule, Rfl: 1  Allergies Patient has no known allergies.  Review of Systems Review of Systems - Oncology ROS as per HPI otherwise 12 point ROS is negative.   Physical Exam  Vitals Wt Readings from Last 3 Encounters:  10/05/17 217 lb 9.6 oz (98.7 kg)  09/21/17 216 lb 3.2 oz (98.1 kg)  09/14/17 216 lb 6.4 oz (98.2 kg)   Temp Readings from Last 3 Encounters:  10/05/17 98.7 F (37.1 C)  09/21/17 97.9 F (36.6 C) (Oral)  09/14/17 98.6 F (37 C) (Oral)   BP Readings from Last 3 Encounters:  10/05/17 (!) 126/92  09/21/17 (!) 150/97  09/14/17 (!) 134/103   Pulse Readings from Last 3 Encounters:  10/05/17 78  09/21/17 71  09/14/17 88   Constitutional: Well-developed, well-nourished, and in no distress.   HENT: Head: Normocephalic and atraumatic.  Mouth/Throat: No  oropharyngeal exudate. Mucosa moist. Eyes: Pupils are equal, round, and reactive to light. Conjunctivae are normal. No scleral icterus.  Neck: Normal range of motion. Neck supple. No JVD present.  Cardiovascular: Normal rate, regular rhythm and normal heart sounds.  Exam reveals no gallop and no friction rub.   No murmur heard. Pulmonary/Chest: Effort normal and breath sounds normal. No respiratory distress. No wheezes.No rales.  Abdominal: Soft. Bowel sounds are normal. No distension. There is no tenderness. There is no guarding.  Musculoskeletal: No edema or tenderness.  Lymphadenopathy: No cervical, axillary or supraclavicular adenopathy.  Neurological: Alert and oriented to person, place, and time. No cranial nerve deficit.  Skin: Skin is warm and dry. No rash noted. No erythema. No pallor.  Psychiatric: Affect and judgment normal.   Labs Infusion on 10/05/2017  Component Date Value Ref Range Status  . LDH 10/05/2017 111  98 - 192 U/L Final   Performed at [facility], [address] [place], [place] [address]  . TSH 10/05/2017 1.337  0.350 - 4.500 uIU/mL Final   Comment: Performed by a 3rd Generation assay with a functional sensitivity of <=0.01 uIU/mL. Performed at [facility], [address] [place], [place] [address]   . WBC 10/05/2017 6.6  4.0 - 10.5 K/uL Final  . RBC 10/05/2017 5.17  4.22 - 5.81 MIL/uL Final  . Hemoglobin 10/05/2017 13.8  13.0 - 17.0 g/dL Final  . HCT 10/05/2017 40.6  39.0 - 52.0 % Final  . MCV 10/05/2017 78.5  78.0 - 100.0 fL Final  . MCH 10/05/2017 26.7  26.0 - 34.0 pg Final  . MCHC 10/05/2017 34.0  30.0 - 36.0 g/dL Final  . RDW 10/05/2017 14.9  11.5 - 15.5 % Final  . Platelets 10/05/2017 197  150 - 400 K/uL Final  . Neutrophils Relative % 10/05/2017 54  % Final  . Neutro Abs 10/05/2017 3.6  1.7 - 7.7 K/uL Final  . Lymphocytes Relative 10/05/2017 39  % Final  . Lymphs Abs 10/05/2017 2.6  0.7 - 4.0 K/uL Final  . Monocytes Relative 10/05/2017 5  % Final   . Monocytes Absolute 10/05/2017 0.3  0.1 - 1.0 K/uL Final  . Eosinophils Relative 10/05/2017 2  % Final  . Eosinophils Absolute 10/05/2017 0.1  0.0 - 0.7 K/uL Final  . Basophils Relative 10/05/2017 0  % Final  . Basophils Absolute 10/05/2017 0.0  0.0 - 0.1 K/uL Final   Performed at [facility], [address] [place], [place] [address]  . Color, Urine 10/05/2017 YELLOW  YELLOW Final  . APPearance 10/05/2017 HAZY* CLEAR Final  . Specific Gravity, Urine 10/05/2017 1.019  1.005 - 1.030 Final  . pH 10/05/2017 5.0  5.0 - 8.0 Final  . Glucose, UA 10/05/2017 NEGATIVE  NEGATIVE mg/dL Final  . Hgb urine dipstick 10/05/2017 NEGATIVE  NEGATIVE Final  . Bilirubin Urine 10/05/2017 NEGATIVE  NEGATIVE Final  . Ketones, ur 10/05/2017 NEGATIVE  NEGATIVE mg/dL Final  . Protein, ur 10/05/2017 NEGATIVE  NEGATIVE mg/dL Final  . Nitrite 10/05/2017 NEGATIVE  NEGATIVE Final  . Leukocytes, UA 10/05/2017 NEGATIVE  NEGATIVE Final   Performed at [facility], [address] [place], [place] [address]  . Sodium 10/05/2017 136  135 - 145 mmol/L Final  . Potassium 10/05/2017 3.5  3.5 - 5.1 mmol/L Final  . Chloride 10/05/2017 102  101 - 111 mmol/L Final  . CO2 10/05/2017 22  22 - 32 mmol/L Final  . Glucose, Bld 10/05/2017 148* 65 - 99 mg/dL Final  . BUN 10/05/2017 10  6 - 20 mg/dL Final  . Creatinine, Ser 10/05/2017 1.08  0.61 - 1.24 mg/dL Final  . Calcium 10/05/2017 9.2  8.9 - 10.3 mg/dL Final  . Total Protein 10/05/2017 7.4  6.5 - 8.1 g/dL Final  . Albumin 10/05/2017 4.1  3.5 - 5.0 g/dL Final  . AST 10/05/2017 21  15 - 41 U/L Final  . ALT 10/05/2017 16* 17 - 63 U/L Final  . Alkaline Phosphatase 10/05/2017 49  38 - 126 U/L Final  .  Total Bilirubin 10/05/2017 0.4  0.3 - 1.2 mg/dL Final  . GFR calc non Af Amer 10/05/2017 >60  >60 mL/min Final  . GFR calc Af Amer 10/05/2017 >60  >60 mL/min Final   Comment: (NOTE) The eGFR has been calculated using the CKD EPI equation. This calculation has not been validated in  all clinical situations. eGFR's persistently <60 mL/min signify possible Chronic Kidney Disease.   Georgiann Hahn gap 10/05/2017 12  5 - 15 Final   Performed at [facility], [address], [place], [place] [address]           Zoila Shutter MD

## 2017-10-19 ENCOUNTER — Other Ambulatory Visit (HOSPITAL_COMMUNITY): Payer: Self-pay | Admitting: Adult Health

## 2017-10-19 ENCOUNTER — Inpatient Hospital Stay (HOSPITAL_COMMUNITY): Payer: Managed Care, Other (non HMO) | Attending: Oncology

## 2017-10-19 ENCOUNTER — Encounter (HOSPITAL_COMMUNITY): Payer: Self-pay

## 2017-10-19 ENCOUNTER — Ambulatory Visit (HOSPITAL_COMMUNITY): Payer: Managed Care, Other (non HMO) | Admitting: Internal Medicine

## 2017-10-19 ENCOUNTER — Other Ambulatory Visit: Payer: Self-pay

## 2017-10-19 ENCOUNTER — Encounter (HOSPITAL_COMMUNITY): Payer: Self-pay | Admitting: Adult Health

## 2017-10-19 VITALS — BP 132/92 | HR 62 | Temp 97.3°F | Resp 18 | Wt 222.0 lb

## 2017-10-19 DIAGNOSIS — C160 Malignant neoplasm of cardia: Secondary | ICD-10-CM | POA: Diagnosis not present

## 2017-10-19 DIAGNOSIS — Z5112 Encounter for antineoplastic immunotherapy: Secondary | ICD-10-CM | POA: Diagnosis present

## 2017-10-19 DIAGNOSIS — C155 Malignant neoplasm of lower third of esophagus: Secondary | ICD-10-CM | POA: Diagnosis present

## 2017-10-19 DIAGNOSIS — C770 Secondary and unspecified malignant neoplasm of lymph nodes of head, face and neck: Secondary | ICD-10-CM | POA: Insufficient documentation

## 2017-10-19 DIAGNOSIS — I10 Essential (primary) hypertension: Secondary | ICD-10-CM

## 2017-10-19 DIAGNOSIS — I158 Other secondary hypertension: Secondary | ICD-10-CM

## 2017-10-19 DIAGNOSIS — C159 Malignant neoplasm of esophagus, unspecified: Secondary | ICD-10-CM

## 2017-10-19 DIAGNOSIS — Z5111 Encounter for antineoplastic chemotherapy: Secondary | ICD-10-CM | POA: Diagnosis present

## 2017-10-19 LAB — COMPREHENSIVE METABOLIC PANEL
ALK PHOS: 48 U/L (ref 38–126)
ALT: 15 U/L — AB (ref 17–63)
AST: 22 U/L (ref 15–41)
Albumin: 4 g/dL (ref 3.5–5.0)
Anion gap: 11 (ref 5–15)
BILIRUBIN TOTAL: 0.7 mg/dL (ref 0.3–1.2)
BUN: 7 mg/dL (ref 6–20)
CALCIUM: 9.1 mg/dL (ref 8.9–10.3)
CHLORIDE: 103 mmol/L (ref 101–111)
CO2: 24 mmol/L (ref 22–32)
CREATININE: 0.95 mg/dL (ref 0.61–1.24)
GFR calc non Af Amer: >60 mL/min (ref >60)
Glucose, Bld: 114 mg/dL — ABNORMAL HIGH (ref 65–99)
Potassium: 3.7 mmol/L (ref 3.5–5.1)
Sodium: 138 mmol/L (ref 135–145)
TOTAL PROTEIN: 7.1 g/dL (ref 6.5–8.1)

## 2017-10-19 LAB — URINALYSIS, DIPSTICK ONLY
BILIRUBIN URINE: NEGATIVE
Glucose, UA: NEGATIVE mg/dL
HGB URINE DIPSTICK: NEGATIVE
Ketones, ur: NEGATIVE mg/dL
Leukocytes, UA: NEGATIVE
NITRITE: NEGATIVE
PROTEIN: NEGATIVE mg/dL
Specific Gravity, Urine: 1.019 (ref 1.005–1.030)
pH: 5 (ref 5.0–8.0)

## 2017-10-19 LAB — CBC WITH DIFFERENTIAL/PLATELET
BASOS PCT: 0 %
Basophils Absolute: 0 10*3/uL (ref 0.0–0.1)
EOS ABS: 0.1 10*3/uL (ref 0.0–0.7)
EOS PCT: 1 %
HCT: 36.6 % — ABNORMAL LOW (ref 39.0–52.0)
Hemoglobin: 12 g/dL — ABNORMAL LOW (ref 13.0–17.0)
LYMPHS ABS: 2.2 10*3/uL (ref 0.7–4.0)
Lymphocytes Relative: 41 %
MCH: 25.9 pg — AB (ref 26.0–34.0)
MCHC: 32.8 g/dL (ref 30.0–36.0)
MCV: 79 fL (ref 78.0–100.0)
Monocytes Absolute: 0.4 10*3/uL (ref 0.1–1.0)
Monocytes Relative: 7 %
NEUTROS PCT: 51 %
Neutro Abs: 2.8 10*3/uL (ref 1.7–7.7)
PLATELETS: 200 10*3/uL (ref 150–400)
RBC: 4.63 MIL/uL (ref 4.22–5.81)
RDW: 15 % (ref 11.5–15.5)
WBC: 5.5 10*3/uL (ref 4.0–10.5)

## 2017-10-19 LAB — LACTATE DEHYDROGENASE: LDH: 113 U/L (ref 98–192)

## 2017-10-19 MED ORDER — HYDROCHLOROTHIAZIDE 12.5 MG PO CAPS: 12.5000 mg | ORAL_CAPSULE | Freq: Every day | ORAL | 1 refills | Status: DC

## 2017-10-19 MED ORDER — HEPARIN SOD (PORK) LOCK FLUSH 100 UNIT/ML IV SOLN
500.0000 [IU] | Freq: Once | INTRAVENOUS | Status: AC | PRN
  Administered 2017-10-19: 500 [IU]
  Filled 2017-10-19: qty 5

## 2017-10-19 MED ORDER — SODIUM CHLORIDE 0.9 % IV SOLN
8.0000 mg/kg | Freq: Once | INTRAVENOUS | Status: AC
  Administered 2017-10-19: 800 mg via INTRAVENOUS
  Filled 2017-10-19: qty 30

## 2017-10-19 MED ORDER — SODIUM CHLORIDE 0.9 % IV SOLN
Freq: Once | INTRAVENOUS | Status: AC
  Administered 2017-10-19: 10:00:00 via INTRAVENOUS

## 2017-10-19 MED ORDER — LOSARTAN POTASSIUM-HCTZ 50-12.5 MG PO TABS: 1.0000 | ORAL_TABLET | Freq: Every day | ORAL | 1 refills | Status: DC

## 2017-10-19 MED ORDER — DIPHENHYDRAMINE HCL 50 MG/ML IJ SOLN
50.0000 mg | Freq: Once | INTRAMUSCULAR | Status: AC
  Administered 2017-10-19: 50 mg via INTRAVENOUS
  Filled 2017-10-19: qty 1

## 2017-10-19 NOTE — Progress Notes (Signed)
Patient here today for chemo as scheduled.   Requesting refill of [facility] compounded cough syrup. Paper Rx for Apothecary HBP cough syrup given to patient today.  He reports intermittent cough, "but I like to just have some cough syrup on standby when I need it because it's the only thing that helps."    Recent blood pressures reviewed as well and remain slightly elevated, which is likely d/t Cyramza.  Will order combo Losartan/HCTZ pill with 50 mg Losartan (same dose) with HCTZ 12.5 mg; I would like him to still take HCTZ 12.5 mg daily for now to total 25 mg HCTZ daily.  (there is no combo pill for 50/25 dosing of Losartan/HCTZ).      Rx for Losartan/HCTZ 50/12.5 mg e-scribed to his pharmacy today. Nursing to review patient's updated medication list with him today.    [name], NP [place] [phone]

## 2017-10-19 NOTE — Progress Notes (Signed)
Tolerated infusion w/o adverse reaction.  Alert, in no distress.  VSS.  Discharged ambulatory.  

## 2017-11-02 ENCOUNTER — Inpatient Hospital Stay (HOSPITAL_COMMUNITY): Payer: Managed Care, Other (non HMO)

## 2017-11-02 ENCOUNTER — Inpatient Hospital Stay (HOSPITAL_BASED_OUTPATIENT_CLINIC_OR_DEPARTMENT_OTHER): Payer: Managed Care, Other (non HMO) | Admitting: Internal Medicine

## 2017-11-02 ENCOUNTER — Encounter (HOSPITAL_COMMUNITY): Payer: Self-pay | Admitting: Internal Medicine

## 2017-11-02 VITALS — BP 134/84 | HR 71 | Temp 97.7°F | Resp 18 | Wt 210.0 lb

## 2017-11-02 DIAGNOSIS — D509 Iron deficiency anemia, unspecified: Secondary | ICD-10-CM

## 2017-11-02 DIAGNOSIS — I10 Essential (primary) hypertension: Secondary | ICD-10-CM

## 2017-11-02 DIAGNOSIS — Z5112 Encounter for antineoplastic immunotherapy: Secondary | ICD-10-CM | POA: Diagnosis not present

## 2017-11-02 DIAGNOSIS — C155 Malignant neoplasm of lower third of esophagus: Secondary | ICD-10-CM | POA: Diagnosis not present

## 2017-11-02 DIAGNOSIS — C160 Malignant neoplasm of cardia: Secondary | ICD-10-CM

## 2017-11-02 DIAGNOSIS — C159 Malignant neoplasm of esophagus, unspecified: Secondary | ICD-10-CM

## 2017-11-02 DIAGNOSIS — C770 Secondary and unspecified malignant neoplasm of lymph nodes of head, face and neck: Secondary | ICD-10-CM

## 2017-11-02 DIAGNOSIS — N200 Calculus of kidney: Secondary | ICD-10-CM

## 2017-11-02 DIAGNOSIS — C158 Malignant neoplasm of overlapping sites of esophagus: Secondary | ICD-10-CM

## 2017-11-02 DIAGNOSIS — R7303 Prediabetes: Secondary | ICD-10-CM

## 2017-11-02 DIAGNOSIS — G629 Polyneuropathy, unspecified: Secondary | ICD-10-CM

## 2017-11-02 LAB — CBC WITH DIFFERENTIAL/PLATELET
BASOS ABS: 0 10*3/uL (ref 0.0–0.1)
BASOS PCT: 0 %
EOS PCT: 1 %
Eosinophils Absolute: 0.1 10*3/uL (ref 0.0–0.7)
HCT: 39.6 % (ref 39.0–52.0)
Hemoglobin: 13.1 g/dL (ref 13.0–17.0)
LYMPHS PCT: 41 %
Lymphs Abs: 2 10*3/uL (ref 0.7–4.0)
MCH: 26.1 pg (ref 26.0–34.0)
MCHC: 33.1 g/dL (ref 30.0–36.0)
MCV: 78.9 fL (ref 78.0–100.0)
Monocytes Absolute: 0.2 10*3/uL (ref 0.1–1.0)
Monocytes Relative: 4 %
NEUTROS ABS: 2.6 10*3/uL (ref 1.7–7.7)
Neutrophils Relative %: 54 %
PLATELETS: 232 10*3/uL (ref 150–400)
RBC: 5.02 MIL/uL (ref 4.22–5.81)
RDW: 14.5 % (ref 11.5–15.5)
WBC: 4.9 10*3/uL (ref 4.0–10.5)

## 2017-11-02 LAB — URINALYSIS, DIPSTICK ONLY
Bilirubin Urine: NEGATIVE
Glucose, UA: NEGATIVE mg/dL
HGB URINE DIPSTICK: NEGATIVE
KETONES UR: 5 mg/dL — AB
LEUKOCYTES UA: NEGATIVE
Nitrite: NEGATIVE
PROTEIN: NEGATIVE mg/dL
Specific Gravity, Urine: 1.031 — ABNORMAL HIGH (ref 1.005–1.030)
pH: 5 (ref 5.0–8.0)

## 2017-11-02 LAB — COMPREHENSIVE METABOLIC PANEL
ALBUMIN: 4.3 g/dL (ref 3.5–5.0)
ALK PHOS: 45 U/L (ref 38–126)
ALT: 14 U/L — ABNORMAL LOW (ref 17–63)
ANION GAP: 12 (ref 5–15)
AST: 23 U/L (ref 15–41)
BUN: 13 mg/dL (ref 6–20)
CALCIUM: 9 mg/dL (ref 8.9–10.3)
CHLORIDE: 101 mmol/L (ref 101–111)
CO2: 25 mmol/L (ref 22–32)
Creatinine, Ser: 0.96 mg/dL (ref 0.61–1.24)
GFR calc non Af Amer: >60 mL/min (ref >60)
GLUCOSE: 161 mg/dL — AB (ref 65–99)
POTASSIUM: 3.3 mmol/L — AB (ref 3.5–5.1)
SODIUM: 138 mmol/L (ref 135–145)
Total Bilirubin: 0.9 mg/dL (ref 0.3–1.2)
Total Protein: 7.3 g/dL (ref 6.5–8.1)

## 2017-11-02 LAB — LACTATE DEHYDROGENASE: LDH: 104 U/L (ref 98–192)

## 2017-11-02 LAB — TSH: TSH: 0.469 u[IU]/mL (ref 0.350–4.500)

## 2017-11-02 MED ORDER — SODIUM CHLORIDE 0.9 % IV SOLN
Freq: Once | INTRAVENOUS | Status: AC
  Administered 2017-11-02: 10:00:00 via INTRAVENOUS

## 2017-11-02 MED ORDER — POTASSIUM CHLORIDE 10 MEQ/100ML IV SOLN
10.0000 meq | Freq: Once | INTRAVENOUS | Status: AC
  Administered 2017-11-02: 10 meq via INTRAVENOUS
  Filled 2017-11-02: qty 100

## 2017-11-02 MED ORDER — SODIUM CHLORIDE 0.9% FLUSH
10.0000 mL | INTRAVENOUS | Status: DC | PRN
  Administered 2017-11-02: 10 mL
  Filled 2017-11-02: qty 10

## 2017-11-02 MED ORDER — DIPHENHYDRAMINE HCL 50 MG/ML IJ SOLN
INTRAMUSCULAR | Status: AC
  Filled 2017-11-02: qty 1

## 2017-11-02 MED ORDER — HEPARIN SOD (PORK) LOCK FLUSH 100 UNIT/ML IV SOLN
500.0000 [IU] | Freq: Once | INTRAVENOUS | Status: AC | PRN
  Administered 2017-11-02: 500 [IU]
  Filled 2017-11-02: qty 5

## 2017-11-02 MED ORDER — SODIUM CHLORIDE 0.9 % IV SOLN
8.0000 mg/kg | Freq: Once | INTRAVENOUS | Status: AC
  Administered 2017-11-02: 800 mg via INTRAVENOUS
  Filled 2017-11-02: qty 50

## 2017-11-02 MED ORDER — DIPHENHYDRAMINE HCL 50 MG/ML IJ SOLN
50.0000 mg | Freq: Once | INTRAMUSCULAR | Status: AC
  Administered 2017-11-02: 50 mg via INTRAVENOUS

## 2017-11-02 NOTE — Patient Instructions (Addendum)
Maxwell [place] at [facility] Discharge Instructions  You were seen today by Dr. Walden Field. She went over your recent labs and so far everything is good. She discussed the numbness and pain in your feet. Please keep an eye on it, if things get worse please let us know. We will schedule a repeat scan in April. Return in April for follow up and review of scan.    Thank you for choosing [place] at [facility] to provide your oncology and hematology care.  To afford each patient quality time with our provider, please arrive at least 15 minutes before your scheduled appointment time.   If you have a lab appointment with the [facility] please come in thru the  Main Entrance and check in at the main information desk  You need to re-schedule your appointment should you arrive 10 or more minutes late.  We strive to give you quality time with our providers, and arriving late affects you and other patients whose appointments are after yours.  Also, if you no show three or more times for appointments you may be dismissed from the clinic at the providers discretion.     Again, thank you for choosing [facility].  Our hope is that these requests will decrease the amount of time that you wait before being seen by our physicians.       _____________________________________________________________  Should you have questions after your visit to [facility], please contact our office at [phone] between the hours of 8:30 a.m. and 4:30 p.m.  Voicemails left after 4:30 p.m. will not be returned until the following business day.  For prescription refill requests, have your pharmacy contact our office.       Resources For Cancer Patients and their Caregivers ? American Cancer Society: Can assist with transportation, wigs, general needs, runs Look Good Feel Better.        989 291 3941 ? Cancer Care: Provides financial assistance,  online support groups, medication/co-pay assistance.  1-800-813-HOPE 539-577-9699) ? [place] Assists [place] cancer patients and their families through emotional , educational and financial support.  (423)537-1376 ? [place] DSS Where to apply for food stamps, Medicaid and utility assistance. (269)592-3387 ? RCATS: Transportation to medical appointments. 279 545 9130 ? Social Security Administration: May apply for disability if have a Stage IV cancer. 262-888-4788 570-259-7277 ? [employer], Disability and Transit Services: Assists with nutrition, care and transit needs. [place] Support Programs:   > Cancer Support Group  2nd Tuesday of the month 1pm-2pm, Journey Room   > Creative Journey  3rd Tuesday of the month 1130am-1pm, Journey Room

## 2017-11-02 NOTE — Patient Instructions (Signed)
[facility] Discharge Instructions for Patients Receiving Chemotherapy   Beginning January 23rd 2017 lab work for the [facility] will be done in the  Main lab at [facility] on 1st floor. If you have a lab appointment with the [facility] please come in thru the  Main Entrance and check in at the main information desk   Today you received the following chemotherapy agents Cyramza as well as Potassium infusion. Follow-up as scheduled. Call clinic for any questions or concerns  To help prevent nausea and vomiting after your treatment, we encourage you to take your nausea medication   If you develop nausea and vomiting, or diarrhea that is not controlled by your medication, call the clinic.  The clinic phone number is [phone]. Office hours are Monday-Friday 8:30am-5:00pm.  BELOW ARE SYMPTOMS THAT SHOULD BE REPORTED IMMEDIATELY:  *FEVER GREATER THAN 101.0 F  *CHILLS WITH OR WITHOUT FEVER  NAUSEA AND VOMITING THAT IS NOT CONTROLLED WITH YOUR NAUSEA MEDICATION  *UNUSUAL SHORTNESS OF BREATH  *UNUSUAL BRUISING OR BLEEDING  TENDERNESS IN MOUTH AND THROAT WITH OR WITHOUT PRESENCE OF ULCERS  *URINARY PROBLEMS  *BOWEL PROBLEMS  UNUSUAL RASH Items with * indicate a potential emergency and should be followed up as soon as possible. If you have an emergency after office hours please contact your primary care physician or go to the nearest emergency department.  Please call the clinic during office hours if you have any questions or concerns.   You may also contact the Patient Navigator at [phone] should you have any questions or need assistance in obtaining follow up care.      Resources For Cancer Patients and their Caregivers ? American Cancer Society: Can assist with transportation, wigs, general needs, runs Look Good Feel Better.        (818)362-0355 ? Cancer Care: Provides financial assistance, online support groups, medication/co-pay  assistance.  1-800-813-HOPE (952) 825-8023) ? [place] Assists [place] cancer patients and their families through emotional , educational and financial support.  581-352-3780 ? [place] DSS Where to apply for food stamps, Medicaid and utility assistance. (279)628-4275 ? RCATS: Transportation to medical appointments. (661) 045-6326 ? Social Security Administration: May apply for disability if have a Stage IV cancer. (959) 340-4918 (217)580-2829 ? [employer], Disability and Transit Services: Assists with nutrition, care and transit needs. 706-499-0881

## 2017-11-02 NOTE — Progress Notes (Signed)
Diagnosis Malignant neoplasm of overlapping sites of esophagus ([place]) - Plan: CT Chest W Contrast, CT Abdomen Pelvis W Contrast, CBC with Differential/Platelet, Comprehensive metabolic panel, Lactate dehydrogenase, Ferritin  Staging Cancer Staging Primary esophageal adenocarcinoma [facility]) Staging form: Esophagus - Adenocarcinoma, AJCC 8th Edition - Clinical stage from 02/11/2017: Stage IVA (cTX, cN3, cM0) - Signed by Baird Cancer, PA-C on 02/11/2017   Assessment and Plan:  1.  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 Oneida Alar.  Pt was followed by Dr. Talbert Cage.  PET scan 01/19/17 revealed primary malignancy involving distal esophagus and gastric cardia, with nodes at base of right neck most consistent with metastatic disease; also metastatic nodes in upper abdomen. Underwent (R) supraclavicular lymph node biopsy on 01/16/17 revealing metastatic poorly differentiated carcinoma; HER2-.  He did not undergo EUS prior to treatment.  Started systemic treatment with FOLFOX chemotherapy on 02/15/17. Patient has completed 6 cycles of FOLFOX to date. His repeat CT C/A/P demonstrated partial response with resolution of his right supraclavicular lymphadenopathy, however he still has residual thickening/hyperenhancement in the distal esophagus/gastric cardia and no change in his abdominal lymphadenopathy.  He was being treated with cyramza with paclitaxel.  On his visit on 09/21/2017 he was complaining of worsening neuropathy and was recommended by Dr. Sherrine Maples to discontinue Taxol.  Last CT scan done 08/08/2017 showed:  IMPRESSION: 1. Further reduction in size of the gastroesophageal mass, and significant reduction in the surrounding upper abdominal adenopathy. 2. The small right upper lobe nodule of concern on the prior exam has nearly completely resolved. 3. Bilateral nonobstructive nephrolithiasis. There is a small complex lesion of the left kidney  lower pole which is stable and probably complex cysts but which may merit surveillance. 4. Other imaging findings of potential clinical significance: Cholelithiasis. Trace free pelvic fluid. Mild lower lumbar impingement. Fused sacroiliac joints.   He will continue Cyramza as recommended and is here for C7D1.  He will RTC in 2 weeks for D15.  Labs adequate for chemotherapy.  He will be set up for repeat imaging in 11/2017.  He will follow-up to go over results.    2.  Iron deficiency anemia.  Felt secondary to esophageal malignancy/ulceration. He has been treated with 2 doses of Feraheme on 03/02/17 & 03/09/17 for low ferritin of 9 . He received 1 dose of feraheme on 04/25/17.  Labs done today HB 13.1.  Will continue to follow labs.    3.  Hypertension.  BP is 113/77.  Follow-up with PCP.   4.  Kidney stones.  This was noted on recent CT done 07/2018.  Pt is asymptomatic.  He is set up for repeat imaging in 11/2017.    5.  Neuropathy.  He does not desire Neurontin.  Will continue to monitor this.  He reports he has been told he is prediabetic. Taxol was discontinued by Dr. Sherrine Maples on 09/21/2017.     Interval History:  1.  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 Oneida Alar.  Pt was followed by Dr. Talbert Cage.  PET scan 01/19/17 revealed primary malignancy involving distal esophagus and gastric cardia, with nodes at base of right neck most consistent with metastatic disease; also metastatic nodes in upper abdomen. Underwent (R) supraclavicular lymph node biopsy on 01/16/17 revealing metastatic poorly differentiated carcinoma; HER2-.  He did not undergo EUS prior to treatment.  Started systemic treatment with FOLFOX chemotherapy on  02/15/17. Patient has completed 6 cycles of FOLFOX to date. His repeat CT C/A/P demonstrated partial response with resolution of his right supraclavicular lymphadenopathy, however he still has residual  thickening/hyperenhancement in the distal esophagus/gastric cardia and no change in his abdominal lymphadenopathy.  He was being treated with cyramza with paclitaxel.  On his visit on 09/21/2017 he was complaining of worsening neuropathy and was recommended by Dr. Sherrine Maples to discontinue Taxol.  Last CT scan done 08/08/2017 showed:  IMPRESSION: 1. Further reduction in size of the gastroesophageal mass, and significant reduction in the surrounding upper abdominal adenopathy. 2. The small right upper lobe nodule of concern on the prior exam has nearly completely resolved. 3. Bilateral nonobstructive nephrolithiasis. There is a small complex lesion of the left kidney lower pole which is stable and probably complex cysts but which may merit surveillance. 4. Other imaging findings of potential clinical significance: Cholelithiasis. Trace free pelvic fluid. Mild lower lumbar impingement. Fused sacroiliac joints.   Current Status:  Pt seen today for follow-up prior to C7 D1 of Cyramza.  He is doing well.  Reports neuropathy.      Primary esophageal adenocarcinoma ([place])   01/16/2017 Initial Diagnosis    Primary esophageal adenocarcinoma ([place])      01/16/2017 Procedure    EGD by Dr. Barney Drain. Partially obstructing, likely malignant esophageal tumor was found in the distal esophagus. Biopsied with surgical path demonstrating Adenocarcinoma. Injected. Treated with argon plasma coagulation (APC) #3. 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 Biopsied with surgical path demonstrating Adenocarcinoma.      01/19/2017 PET scan    NECK Two FDG avid nodes are seen in the base of the neck on the right. The first is seen on CT image 35, just posterior to the right thyroid lobe and the other is seen on CT image 41 measuring 16 mm in short axis with a maximum SUV of 8.6.  CHEST  The patient's known malignancy involving the distal esophagus and cardia of the stomach is FDG  avid with a maximum SUV of 10.4. No definitive metastatic nodes within the chest.  ABDOMEN/PELVIS  FDG avid metastatic adenopathy is seen in the gastrohepatic ligament, paraceliac nodes, posterior to the IVC on image 111, and to the left of the SMA on image 112. The most inferior node is seen in the aortocaval region on image 117. The representative node to the left of the SMA was measured on series 4, image 111 measuring 2.5 by 2.1 cm with a maximum SUV of 7. No other FDG avid disease in the abdomen or pelvis. Cholelithiasis is identified.      02/02/2017 Procedure    US guided bx of right supraclavicular LN by IR.      02/05/2017 Pathology Results    Lymph node, needle/core biopsy, Right Supraclavicular - METASTATIC POORLY DIFFERENTIATED CARCINOMA.      02/10/2017 Pathology Results    HER2 - NEGATIVE      02/13/2017 Procedure    Port placed by IR      02/15/2017 - 04/25/2017 Chemotherapy    FOLFOX x 6 cycles       05/02/2017 Imaging    CT C/A/P: IMPRESSION: 1. Nodular thickening and hyperenhancement in the distal esophagus and gastric cardia could reflect residual tumor. 2. No significant change in size of previously demonstrated hypermetabolic upper abdominal lymph nodes. No progressive adenopathy identified. No residual enlarged supraclavicular nodes are seen. 3. No evidence of progressive metastatic disease. No worrisome hepatic findings. 4. Tiny  right upper lobe pulmonary nodule, not clearly seen on low dose previous PET-CT. Attention on follow-up recommended. 5. Cholelithiasis.        Problem List Patient Active Problem List   Diagnosis Date Noted  . Iron deficiency anemia due to chronic blood loss [D50.0] 02/28/2017  . Primary esophageal adenocarcinoma ([place]) [C15.9] 01/29/2017  . Dysphagia [R13.10] 01/05/2017    Past Medical History Past Medical History:  Diagnosis Date  . Diabetes mellitus without complication ([place])   . Hypertension   . Iron  deficiency anemia due to chronic blood loss 02/28/2017  . Primary esophageal adenocarcinoma ([place]) 01/29/2017    Past Surgical History Past Surgical History:  Procedure Laterality Date  . ESOPHAGOGASTRODUODENOSCOPY (EGD) WITH PROPOFOL N/A 01/16/2017   Procedure: ESOPHAGOGASTRODUODENOSCOPY (EGD) WITH PROPOFOL;  Surgeon: [name], MD;  Location[facility];  Service: Endoscopy;  Laterality: N/A;  730   . IR FLUORO GUIDE PORT INSERTION RIGHT  02/13/2017  . IR US GUIDE VASC ACCESS RIGHT  02/13/2017  . lipoma removal    . PORTA CATH INSERTION Right 02/13/2017  . SAVORY DILATION N/A 01/16/2017   Procedure: SAVORY DILATION;  Surgeon: [name], MD;  Location: [facility];  Service: Endoscopy;  Laterality: N/A;    Family History Family History  Problem Relation Age of Onset  . Prostate cancer Father   . Colon cancer Neg Hx   . Colon polyps Neg Hx      Social History  reports that he has quit smoking. His smoking use included cigarettes. He smoked 0.25 packs per day. He has never used smokeless tobacco. He reports that he has current or past drug history. Drug: Marijuana. He reports that he does not drink alcohol.  Medications  Current Outpatient Medications:  .  Alpha-Lipoic Acid 600 MG CAPS, Take 1 capsule (600 mg total) by mouth daily., Disp: 30 each, Rfl: 11 .  B Complex-C (B-COMPLEX WITH VITAMIN C) tablet, Take 1 tablet by mouth daily., Disp: 30 tablet, Rfl: 11 .  colchicine 0.6 MG tablet, Take 1 tablet (0.6 mg total) by mouth daily., Disp: 30 tablet, Rfl: 1 .  Diphenhyd-Hydrocort-Nystatin (FIRST-DUKES MOUTHWASH) SUSP, Use as directed 5 mLs in the mouth or throat 4 (four) times daily as needed., Disp: 300 mL, Rfl: 1 .  hydrochlorothiazide (MICROZIDE) 12.5 MG capsule, Take 1 capsule (12.5 mg total) by mouth daily., Disp: 90 capsule, Rfl: 1 .  loratadine (CLARITIN) 10 MG tablet, Take 10 mg by mouth daily., Disp: , Rfl:  .  losartan-hydrochlorothiazide (HYZAAR) 50-12.5 MG  tablet, Take 1 tablet by mouth daily., Disp: 90 tablet, Rfl: 1 .  metFORMIN (GLUCOPHAGE) 500 MG tablet, Take 500 mg by mouth daily., Disp: , Rfl:  .  Misc. Devices MISC, Please provide the patient with the Apothecary HPB cough Syrup. 1 teaspoonful every 4-6 hours PRN. Please provide the patient with 360 ml., Disp: 360 each, Rfl: 0 .  Morphine Sulfate (MORPHINE CONCENTRATE) 10 mg / 0.5 ml concentrated solution, Take 0.5 mLs (10 mg total) by mouth every 4 (four) hours as needed for severe pain., Disp: 240 mL, Rfl: 0 .  pantoprazole (PROTONIX) 40 MG tablet, Take 1 tablet (40 mg total) by mouth 2 (two) times daily. Take 30 minutes before breakfast, Disp: 90 tablet, Rfl: 3 .  polyethylene glycol powder (GLYCOLAX/MIRALAX) powder, Take 1 capful daily., Disp: 255 g, Rfl: 2 .  prochlorperazine (COMPAZINE) 10 MG tablet, TAKE 1 TABLET BY MOUTH EVERY 6 HOURS AS NEEDED FOR NAUSEA AND VOMITING,  Disp: 30 tablet, Rfl: 3 .  Ramucirumab (CYRAMZA IV), Inject into the vein., Disp: , Rfl:  .  ranitidine (ZANTAC) 300 MG tablet, Take 1 tablet (300 mg total) by mouth at bedtime., Disp: 60 tablet, Rfl: 1 .  temazepam (RESTORIL) 30 MG capsule, Take 1 capsule (30 mg total) by mouth at bedtime as needed for sleep., Disp: 30 capsule, Rfl: 1 .  gabapentin (NEURONTIN) 300 MG capsule, Take 1 cap PO qHS for 7 days, then if tolerated increase to 1 cap PO twice a day. (Patient not taking: Reported on 10/19/2017), Disp: 60 capsule, Rfl: 3  Current Facility-Administered Medications:  .  potassium chloride 10 mEq in 100 mL IVPB, 10 mEq, Intravenous, Once, Sampson Self, Mathis Dad, MD  Allergies Patient has no known allergies.  Review of Systems Review of Systems - Oncology ROS as per HPI otherwise 12 point ROS is negative other than neuropathy.    Physical Exam  Vitals Wt Readings from Last 3 Encounters:  11/02/17 210 lb (95.3 kg)  10/19/17 222 lb (100.7 kg)  10/05/17 217 lb 9.6 oz (98.7 kg)   Temp Readings from Last 3 Encounters:   11/02/17 97.8 F (36.6 C) (Oral)  10/19/17 (!) 97.3 F (36.3 C) (Oral)  10/05/17 98.7 F (37.1 C)   BP Readings from Last 3 Encounters:  11/02/17 113/77  10/19/17 (!) 132/92  10/05/17 (!) 126/92   Pulse Readings from Last 3 Encounters:  11/02/17 82  10/19/17 62  10/05/17 78   Constitutional: Well-developed, well-nourished, and in no distress.   HENT: Head: Normocephalic and atraumatic.  Mouth/Throat: No oropharyngeal exudate. Mucosa moist. Eyes: Pupils are equal, round, and reactive to light. Conjunctivae are normal. No scleral icterus.  Neck: Normal range of motion. Neck supple. No JVD present.  Cardiovascular: Normal rate, regular rhythm and normal heart sounds.  Exam reveals no gallop and no friction rub.   No murmur heard. Pulmonary/Chest: Effort normal and breath sounds normal. No respiratory distress. No wheezes.No rales.  Abdominal: Soft. Bowel sounds are normal. No distension. There is no tenderness. There is no guarding.  Musculoskeletal: No edema or tenderness.  Lymphadenopathy: No cervical, axillary or supraclavicular adenopathy.  Neurological: Alert and oriented to person, place, and time. No cranial nerve deficit.  Skin: Skin is warm and dry. No rash noted. No erythema. No pallor. Multiple Tattos. Psychiatric: Affect and judgment normal.   Labs Infusion on 11/02/2017  Component Date Value Ref Range Status  . Color, Urine 11/02/2017 AMBER* YELLOW Final   BIOCHEMICALS MAY BE AFFECTED BY COLOR  . APPearance 11/02/2017 HAZY* CLEAR Final  . Specific Gravity, Urine 11/02/2017 1.031* 1.005 - 1.030 Final  . pH 11/02/2017 5.0  5.0 - 8.0 Final  . Glucose, UA 11/02/2017 NEGATIVE  NEGATIVE mg/dL Final  . Hgb urine dipstick 11/02/2017 NEGATIVE  NEGATIVE Final  . Bilirubin Urine 11/02/2017 NEGATIVE  NEGATIVE Final  . Ketones, ur 11/02/2017 5* NEGATIVE mg/dL Final  . Protein, ur 11/02/2017 NEGATIVE  NEGATIVE mg/dL Final  . Nitrite 11/02/2017 NEGATIVE  NEGATIVE Final  .  Leukocytes, UA 11/02/2017 NEGATIVE  NEGATIVE Final   Performed at [facility], [address] [place], [place] [address]  . WBC 11/02/2017 4.9  4.0 - 10.5 K/uL Final  . RBC 11/02/2017 5.02  4.22 - 5.81 MIL/uL Final  . Hemoglobin 11/02/2017 13.1  13.0 - 17.0 g/dL Final  . HCT 11/02/2017 39.6  39.0 - 52.0 % Final  . MCV 11/02/2017 78.9  78.0 - 100.0 fL Final  . [facility] 11/02/2017  26.1  26.0 - 34.0 pg Final  . MCHC 11/02/2017 33.1  30.0 - 36.0 g/dL Final  . RDW 11/02/2017 14.5  11.5 - 15.5 % Final  . Platelets 11/02/2017 232  150 - 400 K/uL Final  . Neutrophils Relative % 11/02/2017 54  % Final  . Neutro Abs 11/02/2017 2.6  1.7 - 7.7 K/uL Final  . Lymphocytes Relative 11/02/2017 41  % Final  . Lymphs Abs 11/02/2017 2.0  0.7 - 4.0 K/uL Final  . Monocytes Relative 11/02/2017 4  % Final  . Monocytes Absolute 11/02/2017 0.2  0.1 - 1.0 K/uL Final  . Eosinophils Relative 11/02/2017 1  % Final  . Eosinophils Absolute 11/02/2017 0.1  0.0 - 0.7 K/uL Final  . Basophils Relative 11/02/2017 0  % Final  . Basophils Absolute 11/02/2017 0.0  0.0 - 0.1 K/uL Final   Performed at [facility], [address], [place], [place] [address]  . LDH 11/02/2017 104  98 - 192 U/L Final   Performed at [facility], [address] [place], [place] [address]  . Sodium 11/02/2017 138  135 - 145 mmol/L Final  . Potassium 11/02/2017 3.3* 3.5 - 5.1 mmol/L Final  . Chloride 11/02/2017 101  101 - 111 mmol/L Final  . CO2 11/02/2017 25  22 - 32 mmol/L Final  . Glucose, Bld 11/02/2017 161* 65 - 99 mg/dL Final  . BUN 11/02/2017 13  6 - 20 mg/dL Final  . Creatinine, Ser 11/02/2017 0.96  0.61 - 1.24 mg/dL Final  . Calcium 11/02/2017 9.0  8.9 - 10.3 mg/dL Final  . Total Protein 11/02/2017 7.3  6.5 - 8.1 g/dL Final  . Albumin 11/02/2017 4.3  3.5 - 5.0 g/dL Final  . AST 11/02/2017 23  15 - 41 U/L Final  . ALT 11/02/2017 14* 17 - 63 U/L Final  . Alkaline Phosphatase 11/02/2017 45  38 - 126 U/L Final  . Total Bilirubin 11/02/2017  0.9  0.3 - 1.2 mg/dL Final  . GFR calc non Af Amer 11/02/2017 >60  >60 mL/min Final  . GFR calc Af Amer 11/02/2017 >60  >60 mL/min Final   Comment: (NOTE) The eGFR has been calculated using the CKD EPI equation. This calculation has not been validated in all clinical situations. eGFR's persistently <60 mL/min signify possible Chronic Kidney Disease.   Georgiann Hahn gap 11/02/2017 12  5 - 15 Final   Performed at [facility], [address], [place], [place] [address]     Pathology Orders Placed This Encounter  Procedures  . CT Chest W Contrast    Standing Status:   Future    Standing Expiration Date:   11/02/2018    Order Specific Question:   If indicated for the ordered procedure, I authorize the administration of contrast media per Radiology protocol    Answer:   Yes    Order Specific Question:   Preferred imaging location?    Answer:   [facility]    Order Specific Question:   Radiology Contrast Protocol - do NOT remove file path    Answer:   \\[email or web address]  . CT Abdomen Pelvis W Contrast    Standing Status:   Future    Standing Expiration Date:   11/02/2018    Order Specific Question:   If indicated for the ordered procedure, I authorize the administration of contrast media per Radiology protocol    Answer:   Yes    Order Specific Question:   Preferred imaging location?  Answer:   [facility]    Order Specific Question:   Radiology Contrast Protocol - do NOT remove file path    Answer:   \\[email or web address]  . CBC with Differential/Platelet    Standing Status:   Future    Standing Expiration Date:   11/03/2018  . Comprehensive metabolic panel    Standing Status:   Future    Standing Expiration Date:   11/03/2018  . Lactate dehydrogenase    Standing Status:   Future    Standing Expiration Date:   11/03/2018  . Ferritin    Standing Status:   Future    Standing Expiration Date:   11/03/2018       Zoila Shutter MD

## 2017-11-02 NOTE — Progress Notes (Signed)
1025 Lab and urine results reviewed with and pt seen by Dr. Walden Field today and approved for Cyramza infusion as well as Potassium infusion with 10 meq per MD                                                                            Maxwell Henderson tolerated Cyramza and Potassium infusions well without complaints or incident. VSS upon discharge. Pt discharged self ambulatory in satisfactory condition accompanied by his wife

## 2017-11-13 ENCOUNTER — Other Ambulatory Visit (HOSPITAL_COMMUNITY): Payer: Self-pay | Admitting: Adult Health

## 2017-11-13 DIAGNOSIS — I158 Other secondary hypertension: Secondary | ICD-10-CM

## 2017-11-13 DIAGNOSIS — C159 Malignant neoplasm of esophagus, unspecified: Secondary | ICD-10-CM

## 2017-11-16 ENCOUNTER — Inpatient Hospital Stay (HOSPITAL_COMMUNITY): Payer: Managed Care, Other (non HMO) | Attending: Oncology

## 2017-11-16 ENCOUNTER — Encounter (HOSPITAL_COMMUNITY): Payer: Self-pay

## 2017-11-16 VITALS — BP 115/82 | HR 77 | Temp 98.2°F | Resp 18 | Wt 207.2 lb

## 2017-11-16 DIAGNOSIS — C160 Malignant neoplasm of cardia: Secondary | ICD-10-CM | POA: Insufficient documentation

## 2017-11-16 DIAGNOSIS — R911 Solitary pulmonary nodule: Secondary | ICD-10-CM | POA: Diagnosis not present

## 2017-11-16 DIAGNOSIS — C159 Malignant neoplasm of esophagus, unspecified: Secondary | ICD-10-CM

## 2017-11-16 DIAGNOSIS — C770 Secondary and unspecified malignant neoplasm of lymph nodes of head, face and neck: Secondary | ICD-10-CM | POA: Insufficient documentation

## 2017-11-16 DIAGNOSIS — G620 Drug-induced polyneuropathy: Secondary | ICD-10-CM | POA: Insufficient documentation

## 2017-11-16 DIAGNOSIS — Z5112 Encounter for antineoplastic immunotherapy: Secondary | ICD-10-CM | POA: Diagnosis present

## 2017-11-16 DIAGNOSIS — C155 Malignant neoplasm of lower third of esophagus: Secondary | ICD-10-CM | POA: Insufficient documentation

## 2017-11-16 LAB — CBC WITH DIFFERENTIAL/PLATELET
BASOS ABS: 0 10*3/uL (ref 0.0–0.1)
BASOS PCT: 1 %
EOS ABS: 0.1 10*3/uL (ref 0.0–0.7)
Eosinophils Relative: 1 %
HEMATOCRIT: 40.4 % (ref 39.0–52.0)
Hemoglobin: 13.3 g/dL (ref 13.0–17.0)
Lymphocytes Relative: 40 %
Lymphs Abs: 2.3 10*3/uL (ref 0.7–4.0)
MCH: 25.8 pg — ABNORMAL LOW (ref 26.0–34.0)
MCHC: 32.9 g/dL (ref 30.0–36.0)
MCV: 78.3 fL (ref 78.0–100.0)
Monocytes Absolute: 0.4 10*3/uL (ref 0.1–1.0)
Monocytes Relative: 8 %
NEUTROS ABS: 2.9 10*3/uL (ref 1.7–7.7)
Neutrophils Relative %: 50 %
PLATELETS: 244 10*3/uL (ref 150–400)
RBC: 5.16 MIL/uL (ref 4.22–5.81)
RDW: 14.1 % (ref 11.5–15.5)
WBC: 5.7 10*3/uL (ref 4.0–10.5)

## 2017-11-16 LAB — URINALYSIS, DIPSTICK ONLY
BILIRUBIN URINE: NEGATIVE
GLUCOSE, UA: NEGATIVE mg/dL
HGB URINE DIPSTICK: NEGATIVE
Ketones, ur: 5 mg/dL — AB
Leukocytes, UA: NEGATIVE
Nitrite: NEGATIVE
Protein, ur: NEGATIVE mg/dL
Specific Gravity, Urine: 1.029 (ref 1.005–1.030)
pH: 5 (ref 5.0–8.0)

## 2017-11-16 LAB — COMPREHENSIVE METABOLIC PANEL
ALK PHOS: 44 U/L (ref 38–126)
ALT: 18 U/L (ref 17–63)
ANION GAP: 11 (ref 5–15)
AST: 22 U/L (ref 15–41)
Albumin: 4.5 g/dL (ref 3.5–5.0)
BILIRUBIN TOTAL: 1.2 mg/dL (ref 0.3–1.2)
BUN: 19 mg/dL (ref 6–20)
CALCIUM: 9.4 mg/dL (ref 8.9–10.3)
CO2: 25 mmol/L (ref 22–32)
CREATININE: 1.14 mg/dL (ref 0.61–1.24)
Chloride: 100 mmol/L — ABNORMAL LOW (ref 101–111)
GFR calc non Af Amer: >60 mL/min (ref >60)
GLUCOSE: 145 mg/dL — AB (ref 65–99)
Potassium: 3.5 mmol/L (ref 3.5–5.1)
Sodium: 136 mmol/L (ref 135–145)
TOTAL PROTEIN: 7.8 g/dL (ref 6.5–8.1)

## 2017-11-16 LAB — LACTATE DEHYDROGENASE: LDH: 107 U/L (ref 98–192)

## 2017-11-16 MED ORDER — DIPHENHYDRAMINE HCL 50 MG/ML IJ SOLN
50.0000 mg | Freq: Once | INTRAMUSCULAR | Status: AC
  Administered 2017-11-16: 50 mg via INTRAVENOUS
  Filled 2017-11-16: qty 1

## 2017-11-16 MED ORDER — SODIUM CHLORIDE 0.9 % IV SOLN: Freq: Once | INTRAVENOUS | Status: DC | PRN

## 2017-11-16 MED ORDER — ALBUTEROL SULFATE (2.5 MG/3ML) 0.083% IN NEBU: 2.5000 mg | INHALATION_SOLUTION | Freq: Once | RESPIRATORY_TRACT | Status: DC | PRN

## 2017-11-16 MED ORDER — SODIUM CHLORIDE 0.9% FLUSH: 3.0000 mL | INTRAVENOUS | Status: DC | PRN

## 2017-11-16 MED ORDER — EPINEPHRINE PF 1 MG/10ML IJ SOSY: 0.2500 mg | PREFILLED_SYRINGE | Freq: Once | INTRAMUSCULAR | Status: DC | PRN

## 2017-11-16 MED ORDER — EPINEPHRINE PF 1 MG/ML IJ SOLN
0.5000 mg | Freq: Once | INTRAMUSCULAR | Status: DC | PRN
Start: 2017-11-16 — End: 2017-11-16

## 2017-11-16 MED ORDER — HEPARIN SOD (PORK) LOCK FLUSH 100 UNIT/ML IV SOLN
500.0000 [IU] | Freq: Once | INTRAVENOUS | Status: AC | PRN
  Administered 2017-11-16: 500 [IU]
  Filled 2017-11-16: qty 5

## 2017-11-16 MED ORDER — SODIUM CHLORIDE 0.9 % IV SOLN
8.0000 mg/kg | Freq: Once | INTRAVENOUS | Status: AC
  Administered 2017-11-16: 800 mg via INTRAVENOUS
  Filled 2017-11-16: qty 50

## 2017-11-16 MED ORDER — ALTEPLASE 2 MG IJ SOLR: 2.0000 mg | Freq: Once | INTRAMUSCULAR | Status: DC | PRN

## 2017-11-16 MED ORDER — FAMOTIDINE IN NACL 20-0.9 MG/50ML-% IV SOLN: 20.0000 mg | Freq: Once | INTRAVENOUS | Status: DC | PRN

## 2017-11-16 MED ORDER — METHYLPREDNISOLONE SODIUM SUCC 125 MG IJ SOLR: 125.0000 mg | Freq: Once | INTRAMUSCULAR | Status: DC | PRN

## 2017-11-16 MED ORDER — DIPHENHYDRAMINE HCL 50 MG/ML IJ SOLN: 50.0000 mg | Freq: Once | INTRAMUSCULAR | Status: DC | PRN

## 2017-11-16 MED ORDER — DIPHENHYDRAMINE HCL 50 MG/ML IJ SOLN: 25.0000 mg | Freq: Once | INTRAMUSCULAR | Status: DC | PRN

## 2017-11-16 MED ORDER — SODIUM CHLORIDE 0.9% FLUSH
10.0000 mL | INTRAVENOUS | Status: DC | PRN
  Administered 2017-11-16: 10 mL
  Filled 2017-11-16: qty 10

## 2017-11-16 MED ORDER — HEPARIN SOD (PORK) LOCK FLUSH 100 UNIT/ML IV SOLN: 250.0000 [IU] | Freq: Once | INTRAVENOUS | Status: DC | PRN

## 2017-11-16 MED ORDER — EPINEPHRINE PF 1 MG/ML IJ SOLN: 0.5000 mg | Freq: Once | INTRAMUSCULAR | Status: DC | PRN

## 2017-11-16 MED ORDER — SODIUM CHLORIDE 0.9 % IV SOLN
Freq: Once | INTRAVENOUS | Status: AC
  Administered 2017-11-16: 10:00:00 via INTRAVENOUS

## 2017-11-16 NOTE — Patient Instructions (Signed)
[facility] Discharge Instructions for Patients Receiving Chemotherapy   Beginning January 23rd 2017 lab work for the Cancer Center will be done in the  Main lab at [facility] on 1st floor. If you have a lab appointment with the Cancer Center please come in thru the  Main Entrance and check in at the main information desk   Today you received the following chemotherapy agents Cyramza. Follow-up as scheduled. Call clinic for any questions or concerns  To help prevent nausea and vomiting after your treatment, we encourage you to take your nausea medication   If you develop nausea and vomiting, or diarrhea that is not controlled by your medication, call the clinic.  The clinic phone number is [phone]. Office hours are Monday-Friday 8:30am-5:00pm.  BELOW ARE SYMPTOMS THAT SHOULD BE REPORTED IMMEDIATELY:  *FEVER GREATER THAN 101.0 F  *CHILLS WITH OR WITHOUT FEVER  NAUSEA AND VOMITING THAT IS NOT CONTROLLED WITH YOUR NAUSEA MEDICATION  *UNUSUAL SHORTNESS OF BREATH  *UNUSUAL BRUISING OR BLEEDING  TENDERNESS IN MOUTH AND THROAT WITH OR WITHOUT PRESENCE OF ULCERS  *URINARY PROBLEMS  *BOWEL PROBLEMS  UNUSUAL RASH Items with * indicate a potential emergency and should be followed up as soon as possible. If you have an emergency after office hours please contact your primary care physician or go to the nearest emergency department.  Please call the clinic during office hours if you have any questions or concerns.   You may also contact the Patient Navigator at [phone] should you have any questions or need assistance in obtaining follow up care.      Resources For Cancer Patients and their Caregivers ? American Cancer Society: Can assist with transportation, wigs, general needs, runs Look Good Feel Better.        1-888-227-6333 ? Cancer Care: Provides financial assistance, online support groups, medication/co-pay assistance.  1-800-813-HOPE  (4673) ? [facility] Assists [place] cancer patients and their families through emotional , educational and financial support.  336-427-4357 ? [place] DSS Where to apply for food stamps, Medicaid and utility assistance. 336-342-1394 ? [facility]: Transportation to medical appointments. 336-347-2287 ? Social Security Administration: May apply for disability if have a Stage IV cancer. 336-342-7796 1-800-772-1213 ? [place] Aging, Disability and Transit Services: Assists with nutrition, care and transit needs. 336-349-2343         

## 2017-11-16 NOTE — Progress Notes (Signed)
Maxwell Henderson tolerated Cyramza infusion well without complaints or incident. Lab and urine results reviewed with Dr. Walden Field prior to administering this medication. VSS upon discharge. Pt discharged self ambulatory in satisfactory condition

## 2017-11-22 ENCOUNTER — Telehealth (HOSPITAL_COMMUNITY): Payer: Self-pay | Admitting: Internal Medicine

## 2017-11-23 ENCOUNTER — Ambulatory Visit (HOSPITAL_COMMUNITY): Admission: RE | Admit: 2017-11-23 | Payer: Managed Care, Other (non HMO) | Source: Ambulatory Visit

## 2017-11-26 ENCOUNTER — Ambulatory Visit (HOSPITAL_COMMUNITY): Payer: Managed Care, Other (non HMO)

## 2017-11-28 ENCOUNTER — Ambulatory Visit (HOSPITAL_COMMUNITY)
Admission: RE | Admit: 2017-11-28 | Discharge: 2017-11-28 | Disposition: A | Discharge status: Home / Self Care | Payer: Managed Care, Other (non HMO) | Source: Ambulatory Visit | Attending: Internal Medicine | Admitting: Internal Medicine

## 2017-11-28 DIAGNOSIS — C158 Malignant neoplasm of overlapping sites of esophagus: Secondary | ICD-10-CM | POA: Diagnosis present

## 2017-11-28 DIAGNOSIS — N289 Disorder of kidney and ureter, unspecified: Secondary | ICD-10-CM | POA: Insufficient documentation

## 2017-11-28 DIAGNOSIS — K8020 Calculus of gallbladder without cholecystitis without obstruction: Secondary | ICD-10-CM | POA: Insufficient documentation

## 2017-11-28 DIAGNOSIS — R911 Solitary pulmonary nodule: Secondary | ICD-10-CM | POA: Insufficient documentation

## 2017-11-28 MED ORDER — IOPAMIDOL (ISOVUE-300) INJECTION 61%
100.0000 mL | Freq: Once | INTRAVENOUS | Status: AC | PRN
  Administered 2017-11-28: 100 mL via INTRAVENOUS

## 2017-12-03 ENCOUNTER — Inpatient Hospital Stay (HOSPITAL_COMMUNITY): Payer: Managed Care, Other (non HMO)

## 2017-12-03 ENCOUNTER — Other Ambulatory Visit: Payer: Self-pay

## 2017-12-03 ENCOUNTER — Inpatient Hospital Stay (HOSPITAL_BASED_OUTPATIENT_CLINIC_OR_DEPARTMENT_OTHER): Payer: Managed Care, Other (non HMO) | Admitting: Hematology

## 2017-12-03 ENCOUNTER — Encounter (HOSPITAL_COMMUNITY): Payer: Self-pay | Admitting: Hematology

## 2017-12-03 VITALS — BP 125/90 | HR 77 | Temp 98.6°F | Resp 16

## 2017-12-03 DIAGNOSIS — R911 Solitary pulmonary nodule: Secondary | ICD-10-CM

## 2017-12-03 DIAGNOSIS — G620 Drug-induced polyneuropathy: Secondary | ICD-10-CM

## 2017-12-03 DIAGNOSIS — C158 Malignant neoplasm of overlapping sites of esophagus: Secondary | ICD-10-CM

## 2017-12-03 DIAGNOSIS — Z5112 Encounter for antineoplastic immunotherapy: Secondary | ICD-10-CM | POA: Diagnosis not present

## 2017-12-03 DIAGNOSIS — C159 Malignant neoplasm of esophagus, unspecified: Secondary | ICD-10-CM

## 2017-12-03 DIAGNOSIS — C770 Secondary and unspecified malignant neoplasm of lymph nodes of head, face and neck: Secondary | ICD-10-CM

## 2017-12-03 DIAGNOSIS — C155 Malignant neoplasm of lower third of esophagus: Secondary | ICD-10-CM | POA: Diagnosis not present

## 2017-12-03 LAB — URINALYSIS, DIPSTICK ONLY
BILIRUBIN URINE: NEGATIVE
GLUCOSE, UA: NEGATIVE mg/dL
HGB URINE DIPSTICK: NEGATIVE
KETONES UR: 5 mg/dL — AB
Leukocytes, UA: NEGATIVE
Nitrite: NEGATIVE
PH: 5 (ref 5.0–8.0)
Protein, ur: NEGATIVE mg/dL
Specific Gravity, Urine: 1.021 (ref 1.005–1.030)

## 2017-12-03 LAB — CBC WITH DIFFERENTIAL/PLATELET
BASOS PCT: 1 %
Basophils Absolute: 0 10*3/uL (ref 0.0–0.1)
EOS ABS: 0.2 10*3/uL (ref 0.0–0.7)
EOS PCT: 3 %
HCT: 39.6 % (ref 39.0–52.0)
Hemoglobin: 13.2 g/dL (ref 13.0–17.0)
Lymphocytes Relative: 40 %
Lymphs Abs: 2.1 10*3/uL (ref 0.7–4.0)
MCH: 26.3 pg (ref 26.0–34.0)
MCHC: 33.3 g/dL (ref 30.0–36.0)
MCV: 78.9 fL (ref 78.0–100.0)
MONO ABS: 0.3 10*3/uL (ref 0.1–1.0)
MONOS PCT: 6 %
Neutro Abs: 2.7 10*3/uL (ref 1.7–7.7)
Neutrophils Relative %: 50 %
PLATELETS: 177 10*3/uL (ref 150–400)
RBC: 5.02 MIL/uL (ref 4.22–5.81)
RDW: 13.8 % (ref 11.5–15.5)
WBC: 5.4 10*3/uL (ref 4.0–10.5)

## 2017-12-03 LAB — COMPREHENSIVE METABOLIC PANEL
ALBUMIN: 4.3 g/dL (ref 3.5–5.0)
ALT: 17 U/L (ref 17–63)
ANION GAP: 7 (ref 5–15)
AST: 21 U/L (ref 15–41)
Alkaline Phosphatase: 44 U/L (ref 38–126)
BILIRUBIN TOTAL: 0.6 mg/dL (ref 0.3–1.2)
BUN: 11 mg/dL (ref 6–20)
CO2: 30 mmol/L (ref 22–32)
Calcium: 9.5 mg/dL (ref 8.9–10.3)
Chloride: 101 mmol/L (ref 101–111)
Creatinine, Ser: 1.04 mg/dL (ref 0.61–1.24)
GFR calc Af Amer: >60 mL/min (ref >60)
GFR calc non Af Amer: >60 mL/min (ref >60)
Glucose, Bld: 144 mg/dL — ABNORMAL HIGH (ref 65–99)
POTASSIUM: 3.7 mmol/L (ref 3.5–5.1)
Sodium: 138 mmol/L (ref 135–145)
TOTAL PROTEIN: 7.4 g/dL (ref 6.5–8.1)

## 2017-12-03 LAB — LACTATE DEHYDROGENASE: LDH: 94 U/L — AB (ref 98–192)

## 2017-12-03 LAB — FERRITIN: Ferritin: 63 ng/mL (ref 24–336)

## 2017-12-03 MED ORDER — SODIUM CHLORIDE 0.9 % IV SOLN
Freq: Once | INTRAVENOUS | Status: AC
  Administered 2017-12-03: 11:00:00 via INTRAVENOUS

## 2017-12-03 MED ORDER — DIPHENHYDRAMINE HCL 50 MG/ML IJ SOLN
50.0000 mg | Freq: Once | INTRAMUSCULAR | Status: AC
  Administered 2017-12-03: 50 mg via INTRAVENOUS

## 2017-12-03 MED ORDER — LIDOCAINE-PRILOCAINE 2.5-2.5 % EX CREA: 1.0000 "application " | TOPICAL_CREAM | CUTANEOUS | 0 refills | Status: DC | PRN

## 2017-12-03 MED ORDER — DIPHENHYDRAMINE HCL 50 MG/ML IJ SOLN
INTRAMUSCULAR | Status: AC
  Filled 2017-12-03: qty 1

## 2017-12-03 MED ORDER — HEPARIN SOD (PORK) LOCK FLUSH 100 UNIT/ML IV SOLN
500.0000 [IU] | Freq: Once | INTRAVENOUS | Status: AC | PRN
  Administered 2017-12-03: 500 [IU]

## 2017-12-03 MED ORDER — SODIUM CHLORIDE 0.9% FLUSH
10.0000 mL | INTRAVENOUS | Status: DC | PRN
  Administered 2017-12-03: 10 mL
  Filled 2017-12-03: qty 10

## 2017-12-03 MED ORDER — SODIUM CHLORIDE 0.9 % IV SOLN
8.0000 mg/kg | Freq: Once | INTRAVENOUS | Status: AC
  Administered 2017-12-03: 800 mg via INTRAVENOUS
  Filled 2017-12-03: qty 50

## 2017-12-03 NOTE — Patient Instructions (Signed)
[facility] Discharge Instructions  Today you saw Dr. K.   Thank you for choosing [facility] at [facility] to provide your oncology and hematology care.  To afford each patient quality time with our provider, please arrive at least 15 minutes before your scheduled appointment time.   If you have a lab appointment with the Cancer Center please come in thru the  Main Entrance and check in at the main information desk  You need to re-schedule your appointment should you arrive 10 or more minutes late.  We strive to give you quality time with our providers, and arriving late affects you and other patients whose appointments are after yours.  Also, if you no show three or more times for appointments you may be dismissed from the clinic at the providers discretion.     Again, thank you for choosing [facility].  Our hope is that these requests will decrease the amount of time that you wait before being seen by our physicians.       _____________________________________________________________  Should you have questions after your visit to [facility], please contact our office at [phone] between the hours of 8:30 a.m. and 4:30 p.m.  Voicemails left after 4:30 p.m. will not be returned until the following business day.  For prescription refill requests, have your pharmacy contact our office.       Resources For Cancer Patients and their Caregivers ? American Cancer Society: Can assist with transportation, wigs, general needs, runs Look Good Feel Better.        1-888-227-6333 ? Cancer Care: Provides financial assistance, online support groups, medication/co-pay assistance.  1-800-813-HOPE (4673) ? [facility] Assists [place] cancer patients and their families through emotional , educational and financial support.  336-427-4357 ? [place] DSS Where to apply for food  stamps, Medicaid and utility assistance. 336-342-1394 ? [facility]: Transportation to medical appointments. 336-347-2287 ? Social Security Administration: May apply for disability if have a Stage IV cancer. 336-342-7796 1-800-772-1213 ? [place] Aging, Disability and Transit Services: Assists with nutrition, care and transit needs. 336-349-2343  Cancer Center Support Programs:   > Cancer Support Group  2nd Tuesday of the month 1pm-2pm, Journey Room   > Creative Journey  3rd Tuesday of the month 1130am-1pm, Journey Room    

## 2017-12-03 NOTE — Progress Notes (Signed)
Maxwell Henderson, [place] [address]   CLINIC:  Medical Oncology/Hematology  PCP:  [name], [place] [address] [place] [address] [phone]   REASON FOR VISIT:  Follow-up for GE junction adenocarcinoma.  CURRENT THERAPY: Cyramza every 2 weeks.  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 Barney Drain. Partially obstructing, likely malignant esophageal tumor was found in the distal esophagus. Biopsied with surgical path demonstrating Adenocarcinoma. Injected. Treated with argon plasma coagulation (APC) #3. 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 Biopsied with surgical path demonstrating Adenocarcinoma.      01/19/2017 PET scan    NECK Two FDG avid nodes are seen in the base of the neck on the right. The first is seen on CT image 35, just posterior to the right thyroid lobe and the other is seen on CT image 41 measuring 16 mm in short axis with a maximum SUV of 8.6.  CHEST  The patient's known malignancy involving the distal esophagus and cardia of the stomach is FDG avid with a maximum SUV of 10.4. No definitive metastatic nodes within the chest.  ABDOMEN/PELVIS  FDG avid metastatic adenopathy is seen in the gastrohepatic ligament, paraceliac nodes, posterior to the IVC on image 111, and to the left of the SMA on image 112. The most inferior node is seen in the aortocaval region on image 117. The representative node to the left of the SMA was measured on series 4, image 111 measuring 2.5 by 2.1 cm with a maximum SUV of 7. No other FDG avid disease in the abdomen or pelvis. Cholelithiasis is identified.      02/02/2017 Procedure    US guided bx of right supraclavicular LN by IR.      02/05/2017 Pathology Results    Lymph node, needle/core biopsy, Right  Supraclavicular - METASTATIC POORLY DIFFERENTIATED CARCINOMA.      02/10/2017 Pathology Results    HER2 - NEGATIVE      02/13/2017 Procedure    Port placed by IR      02/15/2017 - 04/25/2017 Chemotherapy    FOLFOX x 6 cycles       05/02/2017 Imaging    CT C/A/P: IMPRESSION: 1. Nodular thickening and hyperenhancement in the distal esophagus and gastric cardia could reflect residual tumor. 2. No significant change in size of previously demonstrated hypermetabolic upper abdominal lymph nodes. No progressive adenopathy identified. No residual enlarged supraclavicular nodes are seen. 3. No evidence of progressive metastatic disease. No worrisome hepatic findings. 4. Tiny right upper lobe pulmonary nodule, not clearly seen on low dose previous PET-CT. Attention on follow-up recommended. 5. Cholelithiasis.        CANCER STAGING: Cancer Staging Primary esophageal adenocarcinoma [facility]) Staging form: Esophagus - Adenocarcinoma, AJCC 8th Edition - Clinical stage from 02/11/2017: Stage IVA (cTX, cN3, cM0) - Signed by Baird Cancer, PA-C on 02/11/2017    INTERVAL HISTORY:  Maxwell Henderson 52 y.o. male returns for follow-up of GE junction adenocarcinoma.  He is receiving Cyramza every 2 weeks.  His Taxol was discontinued secondary to neuropathy in February.  He denies any fevers, night sweats or weight loss.  He is continuing to work as a Librarian, academic for [employer].  He has some numbness in the toes which is constant.  Occasionally they hurt.  He  has not noticed any improvement since Taxol was discontinued.  He denies any bleeding issues.  His blood pressure is well controlled.  Denies any new onset pains.  REVIEW OF SYSTEMS:  Review of Systems  Constitutional: Negative.   HENT:  Negative.   Respiratory: Negative.   Cardiovascular: Negative.   Gastrointestinal: Negative.   Genitourinary: Negative.    Musculoskeletal: Negative.   Skin: Negative.   Neurological: Positive for  numbness.  Hematological: Negative.   Psychiatric/Behavioral: Negative.      PAST MEDICAL/SURGICAL HISTORY:  Past Medical History:  Diagnosis Date  . Diabetes mellitus without complication ([place])   . Hypertension   . 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 ESOPHAGOGASTRODUODENOSCOPY (EGD) WITH PROPOFOL N/A 01/16/2017   Procedure: ESOPHAGOGASTRODUODENOSCOPY (EGD) WITH PROPOFOL;  Surgeon: [name], MD;  Location: [facility] END[facility];  Service: Endoscopy;  Laterality: N/A;  730   . IR FLUORO GUIDE PORT INSERTION RIGHT  02/13/2017  . IR US GUIDE VASC ACCESS RIGHT  02/13/2017  . lipoma removal    . PORTA CATH INSERTION Right 02/13/2017  . SAVORY DILATION N/A 01/16/2017   Procedure: SAVORY DILATION;  Surgeon: [name], MD;  Location: [facility] ENDO SU[facility];  Service: Endoscopy;  Laterality: N/A;     SOCIAL HISTORY:  Social History   Socioeconomic History  . Marital status: Married    Spouse name: Not on file  . Number of children: Not on file  . Years of education: Not on file  . Highest education level: Not on file  Occupational History  . Not on file  Social Needs  . Financial resource strain: Not on file  . Food insecurity:    Worry: Not on file    Inability: Not on file  . Transportation needs:    Medical: Not on file    Non-medical: Not on file  Tobacco Use  . Smoking status: Former Smoker    Packs/day: 0.25    Types: Cigarettes  . Smokeless tobacco: Never Used  Substance and Sexual Activity  . Alcohol use: No    Comment: hx heavy alcohol, hasn't drank in 19 yrs  . Drug use: Yes    Types: Marijuana    Comment: history of marijuana   . Sexual activity: Yes  Lifestyle  . Physical activity:    Days per week: Not on file    Minutes per session: Not on file  . Stress: Not on file  Relationships  . Social connections:    Talks on phone: Not on  file    Gets together: Not on file    Attends religious service: Not on file    Active member of club or organization: Not on file    Attends meetings of clubs or organizations: Not on file    Relationship status: Not on file  . Intimate partner violence:    Fear of current or ex partner: Not on file    Emotionally abused: Not on file    Physically abused: Not on file    Forced sexual activity: Not on file  Other Topics Concern  . Not on file  Social History Narrative   WORKING [employer].    FAMILY HISTORY:  Family History  Problem Relation Age of Onset  . Prostate cancer Father   . Colon cancer Neg Hx   . Colon polyps Neg Hx  CURRENT MEDICATIONS:  Outpatient Encounter Medications as of 12/03/2017  Medication Sig  . Alpha-Lipoic Acid 600 MG CAPS Take 1 capsule (600 mg total) by mouth daily.  . B Complex-C (B-COMPLEX WITH VITAMIN C) tablet Take 1 tablet by mouth daily.  . colchicine 0.6 MG tablet Take 1 tablet (0.6 mg total) by mouth daily.  . Diphenhyd-Hydrocort-Nystatin (FIRST-DUKES MOUTHWASH) SUSP Use as directed 5 mLs in the mouth or throat 4 (four) times daily as needed.  . gabapentin (NEURONTIN) 300 MG capsule Take 1 cap PO qHS for 7 days, then if tolerated increase to 1 cap PO twice a day.  . hydrochlorothiazide (MICROZIDE) 12.5 MG capsule Take 1 capsule (12.5 mg total) by mouth daily.  . hydrochlorothiazide (MICROZIDE) 12.5 MG capsule TAKE 1 CAPSULE BY MOUTH ONCE DAILY  . loratadine (CLARITIN) 10 MG tablet Take 10 mg by mouth daily.  Marland Kitchen losartan-hydrochlorothiazide (HYZAAR) 50-12.5 MG tablet Take 1 tablet by mouth daily.  . metFORMIN (GLUCOPHAGE) 500 MG tablet Take 500 mg by mouth daily.  . Misc. Devices MISC Please provide the patient with the Apothecary HPB cough Syrup. 1 teaspoonful every 4-6 hours PRN. Please provide the patient with 360 ml.  . Morphine Sulfate (MORPHINE CONCENTRATE) 10 mg / 0.5 ml concentrated solution Take 0.5 mLs (10 mg total)  by mouth every 4 (four) hours as needed for severe pain.  . pantoprazole (PROTONIX) 40 MG tablet Take 1 tablet (40 mg total) by mouth 2 (two) times daily. Take 30 minutes before breakfast  . polyethylene glycol powder (GLYCOLAX/MIRALAX) powder Take 1 capful daily.  . prochlorperazine (COMPAZINE) 10 MG tablet TAKE 1 TABLET BY MOUTH EVERY 6 HOURS AS NEEDED FOR NAUSEA AND VOMITING  . Ramucirumab (CYRAMZA IV) Inject into the vein.  . ranitidine (ZANTAC) 300 MG tablet Take 1 tablet (300 mg total) by mouth at bedtime.  . temazepam (RESTORIL) 30 MG capsule Take 1 capsule (30 mg total) by mouth at bedtime as needed for sleep.   No facility-administered encounter medications on file as of 12/03/2017.     ALLERGIES:  No Known Allergies   PHYSICAL EXAM:  ECOG Performance status: 0  Vitals:   12/03/17 1032  BP: 119/82  Pulse: 73  Resp: 18  Temp: 98.2 F (36.8 C)  SpO2: 100%   Filed Weights   12/03/17 1032  Weight: 216 lb 11.2 oz (98.3 kg)    Physical Exam   LABORATORY DATA:  I have reviewed the labs as listed.  CBC    Component Value Date/Time   WBC 5.4 12/03/2017 0851   RBC 5.02 12/03/2017 0851   HGB 13.2 12/03/2017 0851   HCT 39.6 12/03/2017 0851   PLT 177 12/03/2017 0851   MCV 78.9 12/03/2017 0851   MCH 26.3 12/03/2017 0851   MCHC 33.3 12/03/2017 0851   RDW 13.8 12/03/2017 0851   LYMPHSABS 2.1 12/03/2017 0851   MONOABS 0.3 12/03/2017 0851   EOSABS 0.2 12/03/2017 0851   BASOSABS 0.0 12/03/2017 0851   CMP Latest Ref Rng & Units 12/03/2017 11/16/2017 11/02/2017  Glucose 65 - 99 mg/dL 144(H) 145(H) 161(H)  BUN 6 - 20 mg/dL _0 Creatinine 0.61 - 1.24 mg/dL 1.04 1.14 0.96  Sodium 135 - 145 mmol/L 138 136 138  Potassium 3.5 - 5.1 mmol/L 3.7 3.5 3.3(L)  Chloride 101 - 111 mmol/L 101 100(L) 101  CO2 22 - 32 mmol/L _1 Calcium 8.9 - 10.3 mg/dL 9.5 9.4 9.0  Total Protein 6.5 - 8.1 g/dL 7.4  7.8 7.3  Total Bilirubin 0.3 - 1.2 mg/dL 0.6 1.2 0.9  Alkaline Phos 38 - 126  U/L 44 44 45  AST 15 - 41 U/L _0 ALT 17 - 63 U/L 17 18 14(L)       DIAGNOSTIC IMAGING:  I have independently reviewed the images of the CT scan dated 11/28/2017 which showed a 4 mm right lower lobe lung nodule with stable disease elsewhere.     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  Stage IV GE junction adenocarcinoma: - 6 cycles of FOLFOX from 02/15/2017 through 04/25/2017 -Paclitaxel and Cyramza from 05/18/2017, paclitaxel discontinued on 09/21/2017 secondary to neuropathy - Foundation 1 CDX showing MS-stable, TMB 3Muts/mb - CT scan on 11/28/2017 showing 4 mm right lower lobe nodule which is new, with stable disease elsewhere. -I have discussed the scan results with the patient in detail.  I am reluctant to change therapy because of her 4 mm new right lower lobe lesion.  He is tolerating Cyramza very well.  He will proceed with today's treatment.  I plan to repeat scans in 2-3 months.  He is continuing to work as a Librarian, academic at [employer].  2.  Peripheral neuropathy: He has bilateral toe numbness which hurt at times.  Taxol was discontinued on 09/21/2017.      Orders placed this encounter:  No orders of the defined types were placed in this encounter.     Derek Jack, MD [place] [phone]

## 2017-12-03 NOTE — Patient Instructions (Signed)
[facility] Discharge Instructions for Patients Receiving Chemotherapy   Beginning January 23rd 2017 lab work for the Cancer Center will be done in the  Main lab at [facility] on 1st floor. If you have a lab appointment with the Cancer Center please come in thru the  Main Entrance and check in at the main information desk   Today you received the following chemotherapy agents   To help prevent nausea and vomiting after your treatment, we encourage you to take your nausea medication     If you develop nausea and vomiting, or diarrhea that is not controlled by your medication, call the clinic.  The clinic phone number is [phone]. Office hours are Monday-Friday 8:30am-5:00pm.  BELOW ARE SYMPTOMS THAT SHOULD BE REPORTED IMMEDIATELY:  *FEVER GREATER THAN 101.0 F  *CHILLS WITH OR WITHOUT FEVER  NAUSEA AND VOMITING THAT IS NOT CONTROLLED WITH YOUR NAUSEA MEDICATION  *UNUSUAL SHORTNESS OF BREATH  *UNUSUAL BRUISING OR BLEEDING  TENDERNESS IN MOUTH AND THROAT WITH OR WITHOUT PRESENCE OF ULCERS  *URINARY PROBLEMS  *BOWEL PROBLEMS  UNUSUAL RASH Items with * indicate a potential emergency and should be followed up as soon as possible. If you have an emergency after office hours please contact your primary care physician or go to the nearest emergency department.  Please call the clinic during office hours if you have any questions or concerns.   You may also contact the Patient Navigator at [phone] should you have any questions or need assistance in obtaining follow up care.      Resources For Cancer Patients and their Caregivers ? American Cancer Society: Can assist with transportation, wigs, general needs, runs Look Good Feel Better.        1-888-227-6333 ? Cancer Care: Provides financial assistance, online support groups, medication/co-pay assistance.  1-800-813-HOPE (4673) ? [facility] Assists [place] cancer  patients and their families through emotional , educational and financial support.  336-427-4357 ? [place] DSS Where to apply for food stamps, Medicaid and utility assistance. 336-342-1394 ? [facility]: Transportation to medical appointments. 336-347-2287 ? Social Security Administration: May apply for disability if have a Stage IV cancer. 336-342-7796 1-800-772-1213 ? [place] Aging, Disability and Transit Services: Assists with nutrition, care and transit needs. 336-349-2343         

## 2017-12-03 NOTE — Progress Notes (Signed)
Labs reviewed with MD, patient seen by MD today as well. Proceed with treatment.  Treatment given per orders. Patient tolerated it well without problems. Vitals stable and discharged home from clinic ambulatory. Follow up as scheduled.

## 2017-12-03 NOTE — Assessment & Plan Note (Signed)
1.  Stage IV GE junction adenocarcinoma: - 6 cycles of FOLFOX from 02/15/2017 through 04/25/2017 -Paclitaxel and Cyramza from 05/18/2017, paclitaxel discontinued on 09/21/2017 secondary to neuropathy - Foundation 1 CDX showing MS-stable, TMB 3Muts/mb - CT scan on 11/28/2017 showing 4 mm right lower lobe nodule which is new, with stable disease elsewhere. -I have discussed the scan results with the patient in detail.  I am reluctant to change therapy because of her 4 mm new right lower lobe lesion.  He is tolerating Cyramza very well.  He will proceed with today's treatment.  I plan to repeat scans in 2-3 months.  He is continuing to work as a Librarian, academic at [employer].  2.  Peripheral neuropathy: He has bilateral toe numbness which hurt at times.  Taxol was discontinued on 09/21/2017.

## 2017-12-10 ENCOUNTER — Emergency Department (HOSPITAL_COMMUNITY): Payer: 59

## 2017-12-10 ENCOUNTER — Other Ambulatory Visit: Payer: Self-pay

## 2017-12-10 ENCOUNTER — Inpatient Hospital Stay (HOSPITAL_COMMUNITY)
Admission: EM | Admit: 2017-12-10 | Discharge: 2017-12-12 | DRG: 194 | Disposition: A | Discharge status: Home / Self Care | Payer: 59 | Attending: Internal Medicine | Admitting: Internal Medicine

## 2017-12-10 ENCOUNTER — Encounter (HOSPITAL_COMMUNITY): Payer: Self-pay | Admitting: Emergency Medicine

## 2017-12-10 DIAGNOSIS — J189 Pneumonia, unspecified organism: Secondary | ICD-10-CM | POA: Diagnosis not present

## 2017-12-10 DIAGNOSIS — R05 Cough: Secondary | ICD-10-CM | POA: Diagnosis not present

## 2017-12-10 DIAGNOSIS — Z87891 Personal history of nicotine dependence: Secondary | ICD-10-CM

## 2017-12-10 DIAGNOSIS — J181 Lobar pneumonia, unspecified organism: Secondary | ICD-10-CM | POA: Diagnosis not present

## 2017-12-10 DIAGNOSIS — Z7984 Long term (current) use of oral hypoglycemic drugs: Secondary | ICD-10-CM

## 2017-12-10 DIAGNOSIS — B348 Other viral infections of unspecified site: Secondary | ICD-10-CM | POA: Diagnosis present

## 2017-12-10 DIAGNOSIS — R059 Cough, unspecified: Secondary | ICD-10-CM

## 2017-12-10 DIAGNOSIS — Y95 Nosocomial condition: Secondary | ICD-10-CM | POA: Diagnosis present

## 2017-12-10 DIAGNOSIS — Z79899 Other long term (current) drug therapy: Secondary | ICD-10-CM

## 2017-12-10 DIAGNOSIS — R509 Fever, unspecified: Secondary | ICD-10-CM

## 2017-12-10 DIAGNOSIS — C159 Malignant neoplasm of esophagus, unspecified: Secondary | ICD-10-CM | POA: Diagnosis not present

## 2017-12-10 DIAGNOSIS — I10 Essential (primary) hypertension: Secondary | ICD-10-CM

## 2017-12-10 DIAGNOSIS — E119 Type 2 diabetes mellitus without complications: Secondary | ICD-10-CM | POA: Diagnosis present

## 2017-12-10 DIAGNOSIS — C155 Malignant neoplasm of lower third of esophagus: Secondary | ICD-10-CM | POA: Diagnosis present

## 2017-12-10 DIAGNOSIS — R6510 Systemic inflammatory response syndrome (SIRS) of non-infectious origin without acute organ dysfunction: Secondary | ICD-10-CM | POA: Diagnosis present

## 2017-12-10 DIAGNOSIS — E876 Hypokalemia: Secondary | ICD-10-CM | POA: Diagnosis present

## 2017-12-10 LAB — COMPREHENSIVE METABOLIC PANEL
ALBUMIN: 4.4 g/dL (ref 3.5–5.0)
ALT: 15 U/L — ABNORMAL LOW (ref 17–63)
ANION GAP: 9 (ref 5–15)
AST: 24 U/L (ref 15–41)
Alkaline Phosphatase: 43 U/L (ref 38–126)
BILIRUBIN TOTAL: 1.1 mg/dL (ref 0.3–1.2)
BUN: 10 mg/dL (ref 6–20)
CALCIUM: 8.8 mg/dL — AB (ref 8.9–10.3)
CO2: 28 mmol/L (ref 22–32)
Chloride: 98 mmol/L — ABNORMAL LOW (ref 101–111)
Creatinine, Ser: 1.12 mg/dL (ref 0.61–1.24)
GFR calc non Af Amer: >60 mL/min (ref >60)
GLUCOSE: 102 mg/dL — AB (ref 65–99)
POTASSIUM: 2.8 mmol/L — AB (ref 3.5–5.1)
SODIUM: 135 mmol/L (ref 135–145)
TOTAL PROTEIN: 7.9 g/dL (ref 6.5–8.1)

## 2017-12-10 LAB — URINALYSIS, ROUTINE W REFLEX MICROSCOPIC
BACTERIA UA: NONE SEEN
BILIRUBIN URINE: NEGATIVE
Glucose, UA: NEGATIVE mg/dL
HGB URINE DIPSTICK: NEGATIVE
KETONES UR: 5 mg/dL — AB
LEUKOCYTES UA: NEGATIVE
NITRITE: NEGATIVE
PROTEIN: 30 mg/dL — AB
Specific Gravity, Urine: 1.026 (ref 1.005–1.030)
pH: 7 (ref 5.0–8.0)

## 2017-12-10 LAB — CBC WITH DIFFERENTIAL/PLATELET
BASOS ABS: 0 10*3/uL (ref 0.0–0.1)
BASOS PCT: 0 %
Eosinophils Absolute: 0 10*3/uL (ref 0.0–0.7)
Eosinophils Relative: 0 %
HEMATOCRIT: 38.7 % — AB (ref 39.0–52.0)
HEMOGLOBIN: 12.9 g/dL — AB (ref 13.0–17.0)
LYMPHS PCT: 13 %
Lymphs Abs: 1.2 10*3/uL (ref 0.7–4.0)
MCH: 26.1 pg (ref 26.0–34.0)
MCHC: 33.3 g/dL (ref 30.0–36.0)
MCV: 78.2 fL (ref 78.0–100.0)
MONO ABS: 0.9 10*3/uL (ref 0.1–1.0)
Monocytes Relative: 9 %
NEUTROS ABS: 7 10*3/uL (ref 1.7–7.7)
NEUTROS PCT: 78 %
Platelets: 155 10*3/uL (ref 150–400)
RBC: 4.95 MIL/uL (ref 4.22–5.81)
RDW: 13.8 % (ref 11.5–15.5)
WBC: 9 10*3/uL (ref 4.0–10.5)

## 2017-12-10 LAB — LACTIC ACID, PLASMA
LACTIC ACID, VENOUS: 1.1 mmol/L (ref 0.5–1.9)
LACTIC ACID, VENOUS: 1.2 mmol/L (ref 0.5–1.9)

## 2017-12-10 LAB — MAGNESIUM: MAGNESIUM: 2 mg/dL (ref 1.7–2.4)

## 2017-12-10 MED ORDER — LOSARTAN POTASSIUM 50 MG PO TABS
50.0000 mg | ORAL_TABLET | Freq: Every day | ORAL | Status: DC
  Administered 2017-12-11 – 2017-12-12 (×2): 50 mg via ORAL
  Filled 2017-12-10 (×2): qty 1

## 2017-12-10 MED ORDER — POTASSIUM CHLORIDE 10 MEQ/100ML IV SOLN
10.0000 meq | Freq: Once | INTRAVENOUS | Status: AC
  Administered 2017-12-10: 10 meq via INTRAVENOUS
  Filled 2017-12-10: qty 100

## 2017-12-10 MED ORDER — SODIUM CHLORIDE 0.9% FLUSH: 3.0000 mL | INTRAVENOUS | Status: DC | PRN

## 2017-12-10 MED ORDER — PANTOPRAZOLE SODIUM 40 MG PO TBEC
40.0000 mg | DELAYED_RELEASE_TABLET | Freq: Two times a day (BID) | ORAL | Status: DC
  Administered 2017-12-10 – 2017-12-12 (×4): 40 mg via ORAL
  Filled 2017-12-10 (×4): qty 1

## 2017-12-10 MED ORDER — PIPERACILLIN-TAZOBACTAM 3.375 G IVPB
3.3750 g | Freq: Three times a day (TID) | INTRAVENOUS | Status: DC
  Administered 2017-12-11 – 2017-12-12 (×5): 3.375 g via INTRAVENOUS
  Filled 2017-12-10 (×5): qty 50

## 2017-12-10 MED ORDER — VANCOMYCIN HCL 10 G IV SOLR: 15.0000 mg/kg | Freq: Once | INTRAVENOUS | Status: DC

## 2017-12-10 MED ORDER — ALPHA-LIPOIC ACID 600 MG PO CAPS
1.0000 | ORAL_CAPSULE | Freq: Every day | ORAL | Status: DC
Start: 2017-12-10 — End: 2017-12-11

## 2017-12-10 MED ORDER — B COMPLEX-C PO TABS
1.0000 | ORAL_TABLET | Freq: Every day | ORAL | Status: DC
  Administered 2017-12-11 – 2017-12-12 (×2): 1 via ORAL
  Filled 2017-12-10 (×2): qty 1

## 2017-12-10 MED ORDER — LOSARTAN POTASSIUM-HCTZ 50-12.5 MG PO TABS: 1.0000 | ORAL_TABLET | Freq: Every day | ORAL | Status: DC

## 2017-12-10 MED ORDER — SODIUM CHLORIDE 0.9% FLUSH
3.0000 mL | Freq: Two times a day (BID) | INTRAVENOUS | Status: DC
  Administered 2017-12-10 – 2017-12-11 (×2): 3 mL via INTRAVENOUS

## 2017-12-10 MED ORDER — SODIUM CHLORIDE 0.9 % IV SOLN
500.0000 mg | Freq: Once | INTRAVENOUS | Status: AC
  Administered 2017-12-10: 500 mg via INTRAVENOUS
  Filled 2017-12-10: qty 500

## 2017-12-10 MED ORDER — POTASSIUM CHLORIDE CRYS ER 20 MEQ PO TBCR
40.0000 meq | EXTENDED_RELEASE_TABLET | Freq: Two times a day (BID) | ORAL | Status: AC
  Administered 2017-12-10 – 2017-12-11 (×2): 40 meq via ORAL
  Filled 2017-12-10 (×2): qty 2

## 2017-12-10 MED ORDER — MORPHINE SULFATE (CONCENTRATE) 10 MG/0.5ML PO SOLN: 10.0000 mg | ORAL | Status: DC | PRN

## 2017-12-10 MED ORDER — PIPERACILLIN-TAZOBACTAM 3.375 G IVPB 30 MIN
3.3750 g | Freq: Once | INTRAVENOUS | Status: AC
  Administered 2017-12-10: 3.375 g via INTRAVENOUS
  Filled 2017-12-10: qty 50

## 2017-12-10 MED ORDER — SODIUM CHLORIDE 0.9 % IV SOLN
1.0000 g | Freq: Once | INTRAVENOUS | Status: AC
  Administered 2017-12-10: 1 g via INTRAVENOUS
  Filled 2017-12-10: qty 10

## 2017-12-10 MED ORDER — ACETAMINOPHEN 500 MG PO TABS
1000.0000 mg | ORAL_TABLET | Freq: Once | ORAL | Status: AC
  Administered 2017-12-10: 1000 mg via ORAL
  Filled 2017-12-10: qty 2

## 2017-12-10 MED ORDER — HYDROCHLOROTHIAZIDE 12.5 MG PO CAPS: 12.5000 mg | ORAL_CAPSULE | Freq: Every day | ORAL | Status: DC

## 2017-12-10 MED ORDER — SODIUM CHLORIDE 0.9 % IV SOLN: 250.0000 mL | INTRAVENOUS | Status: DC | PRN

## 2017-12-10 MED ORDER — VANCOMYCIN HCL 10 G IV SOLR
1250.0000 mg | Freq: Two times a day (BID) | INTRAVENOUS | Status: DC
  Administered 2017-12-11 – 2017-12-12 (×3): 1250 mg via INTRAVENOUS
  Filled 2017-12-10 (×5): qty 1250

## 2017-12-10 MED ORDER — IOPAMIDOL (ISOVUE-370) INJECTION 76%
100.0000 mL | Freq: Once | INTRAVENOUS | Status: AC | PRN
  Administered 2017-12-10: 100 mL via INTRAVENOUS

## 2017-12-10 MED ORDER — LORATADINE 10 MG PO TABS
10.0000 mg | ORAL_TABLET | Freq: Every day | ORAL | Status: DC
  Administered 2017-12-11 – 2017-12-12 (×2): 10 mg via ORAL
  Filled 2017-12-10 (×2): qty 1

## 2017-12-10 MED ORDER — ACETAMINOPHEN 325 MG PO TABS
650.0000 mg | ORAL_TABLET | ORAL | Status: DC | PRN
  Administered 2017-12-10: 650 mg via ORAL

## 2017-12-10 MED ORDER — SODIUM CHLORIDE 0.9 % IV SOLN
Freq: Once | INTRAVENOUS | Status: AC
  Administered 2017-12-10: 15:00:00 via INTRAVENOUS

## 2017-12-10 MED ORDER — SODIUM CHLORIDE 0.9 % IV BOLUS
1000.0000 mL | Freq: Once | INTRAVENOUS | Status: AC
  Administered 2017-12-10: 1000 mL via INTRAVENOUS

## 2017-12-10 MED ORDER — VANCOMYCIN HCL 10 G IV SOLR
1500.0000 mg | Freq: Once | INTRAVENOUS | Status: AC
  Administered 2017-12-10: 1500 mg via INTRAVENOUS
  Filled 2017-12-10: qty 1500

## 2017-12-10 MED ORDER — HYDROCHLOROTHIAZIDE 25 MG PO TABS
25.0000 mg | ORAL_TABLET | Freq: Every day | ORAL | Status: DC
  Administered 2017-12-11 – 2017-12-12 (×2): 25 mg via ORAL
  Filled 2017-12-10 (×2): qty 1

## 2017-12-10 NOTE — ED Triage Notes (Signed)
Pt reports productive cough with chills since yesterday.  Generalized body aches as well.  Pt is currently on chemo for esophageal cancer.

## 2017-12-10 NOTE — Progress Notes (Signed)
Pharmacy Antibiotic Note  Maxwell Henderson is a 52 y.o. male admitted on 12/10/2017 with pneumonia.  Pharmacy has been consulted for Vancomycin and Zosyn dosing.  Plan: Vancomycin 1500 mg IV x 1 dose Vancomycin 1250 mg IV every 12 hours.  Goal trough 15-20 mcg/mL. Zosyn 3.375g IV q8h (4 hour infusion).  Monitor labs, c/s, and vanco trough as indicated  Height: 6\' 1"  (185.4 cm) Weight: 205 lb (93 kg) IBW/kg (Calculated) : 79.9  Temp (24hrs), Avg:101 F (38.3 C), Min:99.8 F (37.7 C), Max:103.3 F (39.6 C)  Recent Labs  Lab 12/10/17 1136 12/10/17 1540  WBC 9.0  --   CREATININE 1.12  --   LATICACIDVEN 1.1 1.2    Estimated Creatinine Clearance: 89.2 mL/min (by C-G formula based on SCr of 1.12 mg/dL).    No Known Allergies  Antimicrobials this admission: Vanco 4/29 >>  Zosyn 4/29 >>  Ceftriaxone 4/29>> 1 dose Azithromycin 4/29>> 1 dose  Dose adjustments this admission: N/A  Microbiology results: 4/29 BCx: pending 4/29 Sputum: pending    Thank you for allowing pharmacy to be a part of this patient's care.  Ramond Craver 12/10/2017 7:31 PM

## 2017-12-10 NOTE — ED Provider Notes (Signed)
[facility] EMERGENCY DEPARTMENT Provider Note   CSN: [id number] Arrival date & time: 12/10/17  1058     History   Chief Complaint Chief Complaint  Patient presents with  . Cough    HPI Maxwell Henderson is a 52 y.o. male.  Patient with history of esophageal cancer currently receiving chemotherapy once a week presents with cough, chills since yesterday. Patient denies fever prior to arrival. Patient is followed locally for cancer treatment. No significant sick contacts. Symptoms intermittent.past smoker.     Past Medical History:  Diagnosis Date  . Diabetes mellitus without complication ([place])   . Hypertension   . 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 PNA (pneumonia) 12/10/2017  . 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 Dysphagia 01/05/2017    Past Surgical History:  Procedure Laterality Date  . ESOPHAGOGASTRODUODENOSCOPY (EGD) WITH PROPOFOL N/A 01/16/2017   Procedure: ESOPHAGOGASTRODUODENOSCOPY (EGD) WITH PROPOFOL;  Surgeon: [name], MD;  Location: [facility] ENDO SUITE;  Service: Endoscopy;  Laterality: N/A;  730   . IR FLUORO GUIDE PORT INSERTION RIGHT  02/13/2017  . IR US GUIDE VASC ACCESS RIGHT  02/13/2017  . lipoma removal    . PORTA CATH INSERTION Right 02/13/2017  . SAVORY DILATION N/A 01/16/2017   Procedure: SAVORY DILATION;  Surgeon: [name], MD;  Location: [facility] ENDO SUITE;  Service: Endoscopy;  Laterality: N/A;        Home Medications    Prior to Admission medications   Medication Sig Start Date End Date Taking? Authorizing Provider  Alpha-Lipoic Acid 600 MG CAPS Take 1 capsule (600 mg total) by mouth daily. 09/28/17   Holley Bouche, NP  B Complex-C (B-COMPLEX WITH VITAMIN C) tablet Take 1 tablet by mouth daily. 09/28/17   Holley Bouche,  NP  colchicine 0.6 MG tablet Take 1 tablet (0.6 mg total) by mouth daily. 05/09/17   Twana First, MD  Diphenhyd-Hydrocort-Nystatin (FIRST-DUKES MOUTHWASH) SUSP Use as directed 5 mLs in the mouth or throat 4 (four) times daily as needed. 02/22/17   Baird Cancer, PA-C  gabapentin (NEURONTIN) 300 MG capsule Take 1 cap PO qHS for 7 days, then if tolerated increase to 1 cap PO twice a day. 07/27/17   Twana First, MD  hydrochlorothiazide (MICROZIDE) 12.5 MG capsule Take 1 capsule (12.5 mg total) by mouth daily. 10/19/17   Holley Bouche, NP  hydrochlorothiazide (MICROZIDE) 12.5 MG capsule TAKE 1 CAPSULE BY MOUTH ONCE DAILY 11/13/17   Holley Bouche, NP  lidocaine-prilocaine (EMLA) cream Apply 1 application topically as needed. 12/03/17   Derek Jack, MD  loratadine (CLARITIN) 10 MG tablet Take 10 mg by mouth daily.    [provider]  losartan-hydrochlorothiazide (HYZAAR) 50-12.5 MG tablet Take 1 tablet by mouth daily. 10/19/17   Holley Bouche, NP  metFORMIN (GLUCOPHAGE) 1000 MG tablet Take 1 tablet by mouth daily. 11/27/17   [provider]  metFORMIN (GLUCOPHAGE) 500 MG tablet Take 500 mg by mouth daily.    [provider]  Misc. Devices MISC Please provide the patient with the [facility] HPB cough Syrup. 1 teaspoonful every 4-6 hours PRN. Please provide the patient with 360 ml. 08/17/17   Baird Cancer, PA-C  Morphine Sulfate (MORPHINE CONCENTRATE) 10 mg / 0.5 ml concentrated solution Take 0.5  mLs (10 mg total) by mouth every 4 (four) hours as needed for severe pain. 05/02/17   Twana First, MD  pantoprazole (PROTONIX) 40 MG tablet Take 1 tablet (40 mg total) by mouth 2 (two) times daily. Take 30 minutes before breakfast 05/09/17   Twana First, MD  polyethylene glycol powder [facility]) powder Take 1 capful daily. 02/12/17   Twana First, MD  prochlorperazine (COMPAZINE) 10 MG tablet TAKE 1 TABLET BY MOUTH EVERY 6 HOURS AS NEEDED FOR NAUSEA AND VOMITING  10/15/17   Holley Bouche, NP  Ramucirumab [facility] IV) Inject into the vein.    [provider]  ranitidine (ZANTAC) 300 MG tablet Take 1 tablet (300 mg total) by mouth at bedtime. 06/15/17   Twana First, MD  temazepam (RESTORIL) 30 MG capsule Take 1 capsule (30 mg total) by mouth at bedtime as needed for sleep. 03/14/17   Holley Bouche, NP    Family History Family History  Problem Relation Age of Onset  . Prostate cancer Father   . Colon cancer Neg Hx   . Colon polyps Neg Hx     Social History Social History   Tobacco Use  . Smoking status: Former Smoker    Packs/day: 0.25    Types: Cigarettes  . Smokeless tobacco: Never Used  Substance Use Topics  . Alcohol use: No    Comment: hx heavy alcohol, hasn't drank in 19 yrs  . Drug use: Yes    Types: Marijuana    Comment: history of marijuana      Allergies   Patient has no known allergies.   Review of Systems Review of Systems  Constitutional: Positive for chills. Negative for fever.  HENT: Positive for congestion.   Eyes: Negative for visual disturbance.  Respiratory: Positive for cough and shortness of breath.   Cardiovascular: Negative for chest pain.  Gastrointestinal: Negative for abdominal pain and vomiting.  Genitourinary: Negative for dysuria and flank pain.  Musculoskeletal: Negative for back pain, neck pain and neck stiffness.  Skin: Negative for rash.  Neurological: Positive for light-headedness. Negative for headaches.     Physical Exam Updated Vital Signs BP 133/85   Pulse 100   Temp (!) 103.3 F (39.6 C) (Rectal)   Resp (!) 29   Ht 6\' 1"  (1.854 m)   Wt 93 kg (205 lb)   SpO2 100%   BMI 27.05 kg/m   Physical Exam  Constitutional: He is oriented to person, place, and time. He appears well-developed and well-nourished.  HENT:  Head: Normocephalic and atraumatic.  Eyes: Conjunctivae are normal. Right eye exhibits no discharge. Left eye exhibits no discharge.  Neck: Normal range  of motion. Neck supple. No tracheal deviation present.  Cardiovascular: Normal rate and regular rhythm.  Pulmonary/Chest: Effort normal. He has rales (sparse rales bases).  Abdominal: Soft. He exhibits no distension. There is no tenderness. There is no guarding.  Musculoskeletal: He exhibits no edema.  Neurological: He is alert and oriented to person, place, and time.  Skin: Skin is warm. No rash noted.  Psychiatric: He has a normal mood and affect.  Nursing note and vitals reviewed.    ED Treatments / Results  Labs (all labs ordered are listed, but only abnormal results are displayed) Labs Reviewed  COMPREHENSIVE METABOLIC PANEL - Abnormal; Notable for the following components:      Result Value   Potassium 2.8 (*)    Chloride 98 (*)    Glucose, Bld 102 (*)    Calcium 8.8 (*)  ALT 15 (*)    All other components within normal limits  CBC WITH DIFFERENTIAL/PLATELET - Abnormal; Notable for the following components:   Hemoglobin 12.9 (*)    HCT 38.7 (*)    All other components within normal limits  URINALYSIS, ROUTINE W REFLEX MICROSCOPIC - Abnormal; Notable for the following components:   Color, Urine AMBER (*)    APPearance HAZY (*)    Ketones, ur 5 (*)    Protein, ur 30 (*)    All other components within normal limits  CULTURE, BLOOD (ROUTINE X 2)  CULTURE, BLOOD (ROUTINE X 2)  RESPIRATORY PANEL BY PCR  LACTIC ACID, PLASMA  LACTIC ACID, PLASMA  LACTIC ACID, PLASMA    EKG None EKG reviewed heart rate 91, sinus, normal QT, no acute ST elevation Radiology Dg Chest 2 View  Result Date: 12/10/2017 CLINICAL DATA:  52 year old male with productive cough and chills since yesterday. On chemotherapy for esophageal cancer. Initial encounter. EXAM: CHEST - 2 VIEW COMPARISON:  11/28/2017 chest CT.  08/25/2015 chest x-ray. FINDINGS: No segmental consolidation. Minimal peribronchial thickening may represent changes of bronchitis. Right central line in place tip in the region of the  mid superior vena cava. CT detected 4 mm right lower lobe pulmonary nodule not appreciated by plain film exam. Heart size within normal limits. IMPRESSION: No segmental consolidation. Minimal peribronchial thickening may represent changes of bronchitis. CT detected 4 mm right lower lobe pulmonary nodule not appreciated by plain film exam. Electronically Signed   By: Genia Del M.D.   On: 12/10/2017 12:42   Ct Angio Chest Pe W And/or Wo Contrast  Result Date: 12/10/2017 CLINICAL DATA:  Shortness of breath. History of esophageal carcinoma EXAM: CT ANGIOGRAPHY CHEST WITH CONTRAST TECHNIQUE: Multidetector CT imaging of the chest was performed using the standard protocol during bolus administration of intravenous contrast. Multiplanar CT image reconstructions and MIPs were obtained to evaluate the vascular anatomy. CONTRAST:  184mL ISOVUE-370 IOPAMIDOL (ISOVUE-370) INJECTION 76% COMPARISON:  Chest radiograph November 28, 2017; chest radiograph December 10, 2017 FINDINGS: Cardiovascular: There is no appreciable pulmonary embolus. There is no thoracic aortic aneurysm or dissection. The visualized great vessels appear unremarkable. There is no pericardial effusion or pericardial thickening evident. Port-A-Cath tip is in the superior vena cava. Mediastinum/Nodes: Visualized thyroid appears normal. There are scattered subcentimeter mediastinal lymph nodes. There is a lymph node to the right of the esophagus slightly inferior to the carina measuring 1.4 x 1.3 cm. No other adenopathy is appreciable. There is thickening in the distal esophagus which potentially could be secondary to known esophageal carcinoma. There is a small hiatal hernia. Lungs/Pleura: On axial slice 60 series 6, there is a new lobulated opacity in the posterior segment of the right upper lobe measuring 1.1 x 0.9 cm, best seen on axial slice 60 series 6. There is a new 4 mm nodular opacity in the lateral segment right middle lobe seen on axial slice 70  series 6. The previously noted 4 mm nodular opacity in the superior segment right lower lobe is stable. There is no appreciable consolidation. No pleural effusion or pleural thickening evident. Upper Abdomen: Visualized upper abdominal structures appear unremarkable. Musculoskeletal: There are no blastic or lytic bone lesions. No chest wall lesions are evident. Review of the MIP images confirms the above findings. IMPRESSION: 1. No demonstrable pulmonary embolus. No thoracic aortic aneurysm or dissection. 2. New lymph node to the right of the esophagus in the subcarinal region measuring 1.4 x 1.3 cm. This lymph  node must be of concern for neoplastic etiology given known esophageal carcinoma. 3. Small hiatal hernia. Areas of esophageal wall thickening potentially could be due to known esophageal neoplasm. 4. Nodular opacities as noted, largest in the posterior segment right upper lobe measuring 1.1 x 0.9 cm, multi-lobulated. Concern for progression of parenchymal lung metastatic foci. No frank consolidation evident, although conceivably this area of opacity in the posterior segment right upper lobe could represent early pneumonia as opposed to neoplasm. Electronically Signed   By: Lowella Grip III M.D.   On: 12/10/2017 15:18    Procedures .Critical Care Performed by: Elnora Morrison, MD Authorized by: Elnora Morrison, MD   Critical care provider statement:    Critical care time (minutes):  35   Critical care start time:  12/10/2017 3:00 PM   Critical care end time:  12/10/2017 3:35 PM   Critical care time was exclusive of:  Separately billable procedures and treating other patients and teaching time   Critical care was necessary to treat or prevent imminent or life-threatening deterioration of the following conditions:  Sepsis   Critical care was time spent personally by me on the following activities:  Ordering and review of laboratory studies, ordering and review of radiographic studies, ordering and  performing treatments and interventions, re-evaluation of patient's condition and examination of patient   I assumed direction of critical care for this patient from another provider in my specialty: no     (including critical care time)  Medications Ordered in ED Medications  potassium chloride 10 mEq in 100 mL IVPB (10 mEq Intravenous New Bag/Given 12/10/17 1508)  vancomycin (VANCOCIN) 1,395 mg in sodium chloride 0.9 % 500 mL IVPB (has no administration in time range)  piperacillin-tazobactam (ZOSYN) IVPB 3.375 g (has no administration in time range)  sodium chloride 0.9 % bolus 1,000 mL (0 mLs Intravenous Stopped 12/10/17 1429)  acetaminophen (TYLENOL) tablet 1,000 mg (1,000 mg Oral Given 12/10/17 1217)  cefTRIAXone (ROCEPHIN) 1 g in sodium chloride 0.9 % 100 mL IVPB (0 g Intravenous Stopped 12/10/17 1428)  azithromycin (ZITHROMAX) 500 mg in sodium chloride 0.9 % 250 mL IVPB (500 mg Intravenous New Bag/Given 12/10/17 1430)  0.9 %  sodium chloride infusion ( Intravenous New Bag/Given 12/10/17 1432)  iopamidol (ISOVUE-370) 76 % injection 100 mL (100 mLs Intravenous Contrast Given 12/10/17 1445)     Initial Impression / Assessment and Plan / ED Course  I have reviewed the triage vital signs and the nursing notes.  Pertinent labs & imaging results that were available during my care of the patient were reviewed by me and considered in my medical decision making (see chart for details).    Patient presents with cough and new fever with active chemotherapy treatment/immunosuppressed. Lactic acid normal, blood pressure normal, IV fluids going. Rocephin given for possible concern for me acquired pneumonia. Plan for viral respiratory panel him a blood work reviewed overall unremarkable. Chest x-ray reviewed no acute findings. CT scan obtained to look for occult pneumonia versus pulmonary embolism with hemoptysis. Tylenol given for fever.Blood work reviewed no acute abnormalities except for hypokalemia.  Potassium ordered.  CT concerning for occult pneumonia versus malignancy. With immunosuppression IV antibiotics broadened and admitted to follow-up cultures/ resp viral panel and monitoring.  The patients results and plan were reviewed and discussed.   Any x-rays performed were independently reviewed by myself.   Differential diagnosis were considered with the presenting HPI.  Medications  potassium chloride 10 mEq in 100 mL IVPB (10 mEq Intravenous  New Bag/Given 12/10/17 1508)  vancomycin (VANCOCIN) 1,395 mg in sodium chloride 0.9 % 500 mL IVPB (has no administration in time range)  piperacillin-tazobactam (ZOSYN) IVPB 3.375 g (has no administration in time range)  sodium chloride 0.9 % bolus 1,000 mL (0 mLs Intravenous Stopped 12/10/17 1429)  acetaminophen (TYLENOL) tablet 1,000 mg (1,000 mg Oral Given 12/10/17 1217)  cefTRIAXone (ROCEPHIN) 1 g in sodium chloride 0.9 % 100 mL IVPB (0 g Intravenous Stopped 12/10/17 1428)  azithromycin (ZITHROMAX) 500 mg in sodium chloride 0.9 % 250 mL IVPB (500 mg Intravenous New Bag/Given 12/10/17 1430)  0.9 %  sodium chloride infusion ( Intravenous New Bag/Given 12/10/17 1432)  iopamidol (ISOVUE-370) 76 % injection 100 mL (100 mLs Intravenous Contrast Given 12/10/17 1445)    Vitals:   12/10/17 1111 12/10/17 1112 12/10/17 1219 12/10/17 1508  BP: (!) 143/89   133/85  Pulse: 100     Resp: 18   (!) 29  Temp: 99.9 F (37.7 C)  (!) 103.3 F (39.6 C)   TempSrc: Oral  Rectal   SpO2: 100%     Weight:  93 kg (205 lb)    Height:  6\' 1"  (1.854 m)      Final diagnoses:  Fever in adult  Cough    Admission/ observation were discussed with the admitting physician, patient and/or family and they are comfortable with the plan.     Final Clinical Impressions(s) / ED Diagnoses   Final diagnoses:  Fever in adult  Cough    ED Discharge Orders    None       Elnora Morrison, MD 12/10/17 1556

## 2017-12-10 NOTE — H&P (Signed)
History and Physical    Maxwell Henderson HCW:[id number] DOB: 1966-05-11 DOA: 12/10/2017  PCP: [name], FNP  Patient coming from: Home  Chief Complaint: Cough fever  HPI: Maxwell Henderson is a 52 y.o. male with medical history significant of esophageal cancer stage IV has been on chemotherapy almost a year who comes in with approximately 2 days of worsening cough fevers up to 103 and chills.  He denies any sick contacts with anyone with the flu.  He denies any nausea vomiting diarrhea.  He denies any chest pain or shortness of breath.  He has been coughing up a little bit of blood.  He denies any urinary symptoms.  Patient is being referred for admission for pneumonia likely in an immunocompromised patient.  He has not recently been on antibiotics or hospitalized.  Review of Systems: As per HPI otherwise 10 point review of systems negative.   Past Medical History:  Diagnosis Date  . Diabetes mellitus without complication ([place])   . Hypertension   . 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 ESOPHAGOGASTRODUODENOSCOPY (EGD) WITH PROPOFOL N/A 01/16/2017   Procedure: ESOPHAGOGASTRODUODENOSCOPY (EGD) WITH PROPOFOL;  Surgeon: [name], MD;  Location: [facility];  Service: Endoscopy;  Laterality: N/A;  730   . IR FLUORO GUIDE PORT INSERTION RIGHT  02/13/2017  . IR US GUIDE VASC ACCESS RIGHT  02/13/2017  . lipoma removal    . PORTA CATH INSERTION Right 02/13/2017  . SAVORY DILATION N/A 01/16/2017   Procedure: SAVORY DILATION;  Surgeon: [name], MD;  Location: [facility];  Service: Endoscopy;  Laterality: N/A;     reports that he has quit smoking. His smoking use included cigarettes. He smoked 0.25 packs per day. He has never used smokeless tobacco. He reports that he has current or past drug history. Drug: Marijuana. He reports that he does not drink  alcohol.  No Known Allergies  Family History  Problem Relation Age of Onset  . Prostate cancer Father   . Colon cancer Neg Hx   . Colon polyps Neg Hx     Prior to Admission medications   Medication Sig Start Date End Date Taking? Authorizing Provider  Alpha-Lipoic Acid 600 MG CAPS Take 1 capsule (600 mg total) by mouth daily. 09/28/17  Yes Holley Bouche, NP  B Complex-C (B-COMPLEX WITH VITAMIN C) tablet Take 1 tablet by mouth daily. 09/28/17  Yes Holley Bouche, NP  gabapentin (NEURONTIN) 300 MG capsule Take 1 cap PO qHS for 7 days, then if tolerated increase to 1 cap PO twice a day. Patient taking differently: Take 300 mg by mouth 2 (two) times daily.  07/27/17  Yes Twana First, MD  hydrochlorothiazide (MICROZIDE) 12.5 MG capsule Take 1 capsule (12.5 mg total) by mouth daily. 10/19/17  Yes Holley Bouche, NP  lidocaine-prilocaine (EMLA) cream Apply 1 application topically as needed. 12/03/17  Yes Derek Jack, MD  loratadine (CLARITIN) 10 MG tablet Take 10 mg by mouth daily.   Yes [provider]  losartan-hydrochlorothiazide (HYZAAR) 50-12.5 MG tablet Take 1 tablet by mouth daily. 10/19/17  Yes Holley Bouche, NP  metFORMIN (GLUCOPHAGE) 500 MG tablet Take 500 mg by mouth daily.   Yes [provider]  Morphine Sulfate (MORPHINE CONCENTRATE) 10 mg / 0.5 ml concentrated solution Take 0.5 mLs (10 mg total) by mouth every 4 (  four) hours as needed for severe pain. 05/02/17  Yes Twana First, MD  pantoprazole (PROTONIX) 40 MG tablet Take 1 tablet (40 mg total) by mouth 2 (two) times daily. Take 30 minutes before breakfast 05/09/17  Yes Twana First, MD  prochlorperazine (COMPAZINE) 10 MG tablet TAKE 1 TABLET BY MOUTH EVERY 6 HOURS AS NEEDED FOR NAUSEA AND VOMITING 10/15/17  Yes Holley Bouche, NP  Ramucirumab [facility] IV) Inject into the vein every 14 (fourteen) days. Fridays of every other week   Yes [provider]  ranitidine (ZANTAC) 300 MG  tablet Take 1 tablet (300 mg total) by mouth at bedtime. 06/15/17  Yes Twana First, MD  Misc. Devices MISC Please provide the patient with the [facility] HPB cough Syrup. 1 teaspoonful every 4-6 hours PRN. Please provide the patient with 360 ml. 08/17/17   Baird Cancer, PA-C    Physical Exam: Vitals:   12/10/17 1219 12/10/17 1508 12/10/17 1530 12/10/17 1600  BP:  133/85 (!) 138/97 (!) 150/98  Pulse:      Resp:  (!) 29 19 (!) 26  Temp: (!) 103.3 F (39.6 C)     TempSrc: Rectal     SpO2:      Weight:      Height:          Constitutional: NAD, calm, comfortable and coughing Vitals:   12/10/17 1219 12/10/17 1508 12/10/17 1530 12/10/17 1600  BP:  133/85 (!) 138/97 (!) 150/98  Pulse:      Resp:  (!) 29 19 (!) 26  Temp: (!) 103.3 F (39.6 C)     TempSrc: Rectal     SpO2:      Weight:      Height:       Eyes: PERRL, lids and conjunctivae normal ENMT: Mucous membranes are moist. Posterior pharynx clear of any exudate or lesions.Normal dentition.  Neck: normal, supple, no masses, no thyromegaly Respiratory: clear to auscultation bilaterally, no wheezing, no crackles. Normal respiratory effort. No accessory muscle use.  Cardiovascular: Regular rate and rhythm, no murmurs / rubs / gallops. No extremity edema. 2+ pedal pulses. No carotid bruits.  Abdomen: no tenderness, no masses palpated. No hepatosplenomegaly. Bowel sounds positive.  Musculoskeletal: no clubbing / cyanosis. No joint deformity upper and lower extremities. Good ROM, no contractures. Normal muscle tone.  Skin: no rashes, lesions, ulcers. No induration Neurologic: CN 2-12 grossly intact. Sensation intact, DTR normal. Strength 5/5 in all 4.  Psychiatric: Normal judgment and insight. Alert and oriented x 3. Normal mood.    Labs on Admission: I have personally reviewed following labs and imaging studies  CBC: Recent Labs  Lab 12/10/17 1136  WBC 9.0  NEUTROABS 7.0  HGB 12.9*  HCT 38.7*  MCV 78.2  PLT 643    Basic Metabolic Panel: Recent Labs  Lab 12/10/17 1136  NA 135  K 2.8*  CL 98*  CO2 28  GLUCOSE 102*  BUN 10  CREATININE 1.12  CALCIUM 8.8*   GFR: Estimated Creatinine Clearance: 89.2 mL/min (by C-G formula based on SCr of 1.12 mg/dL). Liver Function Tests: Recent Labs  Lab 12/10/17 1136  AST 24  ALT 15*  ALKPHOS 43  BILITOT 1.1  PROT 7.9  ALBUMIN 4.4   No results for input(s): LIPASE, AMYLASE in the last 168 hours. No results for input(s): AMMONIA in the last 168 hours. Coagulation Profile: No results for input(s): INR, PROTIME in the last 168 hours. Cardiac Enzymes: No results for input(s): CKTOTAL, CKMB, CKMBINDEX, TROPONINI  in the last 168 hours. BNP (last 3 results) No results for input(s): PROBNP in the last 8760 hours. HbA1C: No results for input(s): HGBA1C in the last 72 hours. CBG: No results for input(s): GLUCAP in the last 168 hours. Lipid Profile: No results for input(s): CHOL, HDL, LDLCALC, TRIG, CHOLHDL, LDLDIRECT in the last 72 hours. Thyroid Function Tests: No results for input(s): TSH, T4TOTAL, FREET4, T3FREE, THYROIDAB in the last 72 hours. Anemia Panel: No results for input(s): VITAMINB12, FOLATE, FERRITIN, TIBC, IRON, RETICCTPCT in the last 72 hours. Urine analysis:    Component Value Date/Time   COLORURINE AMBER (A) 12/10/2017 1114   APPEARANCEUR HAZY (A) 12/10/2017 1114   LABSPEC 1.026 12/10/2017 1114   PHURINE 7.0 12/10/2017 1114   GLUCOSEU NEGATIVE 12/10/2017 1114   HGBUR NEGATIVE 12/10/2017 1114   BILIRUBINUR NEGATIVE 12/10/2017 1114   KETONESUR 5 (A) 12/10/2017 1114   PROTEINUR 30 (A) 12/10/2017 1114   NITRITE NEGATIVE 12/10/2017 1114   LEUKOCYTESUR NEGATIVE 12/10/2017 1114   Sepsis Labs: !!!!!!!!!!!!!!!!!!!!!!!!!!!!!!!!!!!!!!!!!!!! @LABRCNTIP (procalcitonin:4,lacticidven:4) ) Recent Results (from the past 240 hour(s))  Blood culture (routine x 2)     Status: None (Preliminary result)   Collection Time: 12/10/17 11:36 AM   Result Value Ref Range Status   Specimen Description BLOOD RIGHT ARM  Final   Special Requests   Final    BOTTLES DRAWN AEROBIC AND ANAEROBIC Blood Culture results may not be optimal due to an excessive volume of blood received in culture bottles Performed at [facility], [address] [place], [place] [address]    Culture PENDING  Incomplete   Report Status PENDING  Incomplete  Blood culture (routine x 2)     Status: None (Preliminary result)   Collection Time: 12/10/17 11:36 AM  Result Value Ref Range Status   Specimen Description BLOOD RIGHT FOREARM  Final   Special Requests   Final    BOTTLES DRAWN AEROBIC AND ANAEROBIC Blood Culture adequate volume Performed at [facility], [address], [place], [place] [address]    Culture PENDING  Incomplete   Report Status PENDING  Incomplete     Radiological Exams on Admission: Dg Chest 2 View  Result Date: 12/10/2017 CLINICAL DATA:  52 year old male with productive cough and chills since yesterday. On chemotherapy for esophageal cancer. Initial encounter. EXAM: CHEST - 2 VIEW COMPARISON:  11/28/2017 chest CT.  08/25/2015 chest x-ray. FINDINGS: No segmental consolidation. Minimal peribronchial thickening may represent changes of bronchitis. Right central line in place tip in the region of the mid superior vena cava. CT detected 4 mm right lower lobe pulmonary nodule not appreciated by plain film exam. Heart size within normal limits. IMPRESSION: No segmental consolidation. Minimal peribronchial thickening may represent changes of bronchitis. CT detected 4 mm right lower lobe pulmonary nodule not appreciated by plain film exam. Electronically Signed   By: Genia Del M.D.   On: 12/10/2017 12:42   Ct Angio Chest Pe W And/or Wo Contrast  Result Date: 12/10/2017 CLINICAL DATA:  Shortness of breath. History of esophageal carcinoma EXAM: CT ANGIOGRAPHY CHEST WITH CONTRAST TECHNIQUE: Multidetector CT imaging of the chest was performed using the  standard protocol during bolus administration of intravenous contrast. Multiplanar CT image reconstructions and MIPs were obtained to evaluate the vascular anatomy. CONTRAST:  158mL ISOVUE-370 IOPAMIDOL (ISOVUE-370) INJECTION 76% COMPARISON:  Chest radiograph November 28, 2017; chest radiograph December 10, 2017 FINDINGS: Cardiovascular: There is no appreciable pulmonary embolus. There is no thoracic aortic aneurysm or dissection. The visualized great vessels appear  unremarkable. There is no pericardial effusion or pericardial thickening evident. Port-A-Cath tip is in the superior vena cava. Mediastinum/Nodes: Visualized thyroid appears normal. There are scattered subcentimeter mediastinal lymph nodes. There is a lymph node to the right of the esophagus slightly inferior to the carina measuring 1.4 x 1.3 cm. No other adenopathy is appreciable. There is thickening in the distal esophagus which potentially could be secondary to known esophageal carcinoma. There is a small hiatal hernia. Lungs/Pleura: On axial slice 60 series 6, there is a new lobulated opacity in the posterior segment of the right upper lobe measuring 1.1 x 0.9 cm, best seen on axial slice 60 series 6. There is a new 4 mm nodular opacity in the lateral segment right middle lobe seen on axial slice 70 series 6. The previously noted 4 mm nodular opacity in the superior segment right lower lobe is stable. There is no appreciable consolidation. No pleural effusion or pleural thickening evident. Upper Abdomen: Visualized upper abdominal structures appear unremarkable. Musculoskeletal: There are no blastic or lytic bone lesions. No chest wall lesions are evident. Review of the MIP images confirms the above findings. IMPRESSION: 1. No demonstrable pulmonary embolus. No thoracic aortic aneurysm or dissection. 2. New lymph node to the right of the esophagus in the subcarinal region measuring 1.4 x 1.3 cm. This lymph node must be of concern for neoplastic etiology  given known esophageal carcinoma. 3. Small hiatal hernia. Areas of esophageal wall thickening potentially could be due to known esophageal neoplasm. 4. Nodular opacities as noted, largest in the posterior segment right upper lobe measuring 1.1 x 0.9 cm, multi-lobulated. Concern for progression of parenchymal lung metastatic foci. No frank consolidation evident, although conceivably this area of opacity in the posterior segment right upper lobe could represent early pneumonia as opposed to neoplasm. Electronically Signed   By: Lowella Grip III M.D.   On: 12/10/2017 15:18    Old chart reviewed Case discussed with Dr. Reather Converse in the ED  Assessment/Plan 52 year old male with esophageal cancer on chemotherapy comes in with probable pneumonia or flu like illness Principal Problem:   PNA (pneumonia)-viral panel is pending.  Questionable whether or not he has pneumonia per CT of his chest.  CTA however is negative for PE.  Cover empirically with vancomycin and Zosyn.  Follow-up on blood cultures and sputum cultures. Active Problems:   Primary esophageal adenocarcinoma (H[facility])-noted continue outpatient treatment   Hypokalemia-replete and check magnesium level      DVT prophylaxis: SCDs Code Status: Full Family Communication: None Disposition Plan: Per day team Consults called: None Admission status: Observation   Laverda Stribling A MD [facility]  If 7PM-7AM, please contact night-coverage [email or web address] Password [facility]  12/10/2017, 5:38 PM

## 2017-12-11 DIAGNOSIS — C159 Malignant neoplasm of esophagus, unspecified: Secondary | ICD-10-CM | POA: Diagnosis not present

## 2017-12-11 DIAGNOSIS — J181 Lobar pneumonia, unspecified organism: Secondary | ICD-10-CM | POA: Diagnosis present

## 2017-12-11 DIAGNOSIS — I10 Essential (primary) hypertension: Secondary | ICD-10-CM | POA: Diagnosis present

## 2017-12-11 DIAGNOSIS — Z87891 Personal history of nicotine dependence: Secondary | ICD-10-CM | POA: Diagnosis not present

## 2017-12-11 DIAGNOSIS — B348 Other viral infections of unspecified site: Secondary | ICD-10-CM | POA: Diagnosis not present

## 2017-12-11 DIAGNOSIS — R6510 Systemic inflammatory response syndrome (SIRS) of non-infectious origin without acute organ dysfunction: Secondary | ICD-10-CM | POA: Diagnosis present

## 2017-12-11 DIAGNOSIS — J189 Pneumonia, unspecified organism: Secondary | ICD-10-CM | POA: Diagnosis not present

## 2017-12-11 DIAGNOSIS — C155 Malignant neoplasm of lower third of esophagus: Secondary | ICD-10-CM | POA: Diagnosis present

## 2017-12-11 DIAGNOSIS — Z79899 Other long term (current) drug therapy: Secondary | ICD-10-CM | POA: Diagnosis not present

## 2017-12-11 DIAGNOSIS — Z7984 Long term (current) use of oral hypoglycemic drugs: Secondary | ICD-10-CM | POA: Diagnosis not present

## 2017-12-11 DIAGNOSIS — E119 Type 2 diabetes mellitus without complications: Secondary | ICD-10-CM | POA: Diagnosis present

## 2017-12-11 DIAGNOSIS — R05 Cough: Secondary | ICD-10-CM | POA: Diagnosis present

## 2017-12-11 DIAGNOSIS — E876 Hypokalemia: Secondary | ICD-10-CM | POA: Diagnosis present

## 2017-12-11 DIAGNOSIS — Y95 Nosocomial condition: Secondary | ICD-10-CM | POA: Diagnosis present

## 2017-12-11 LAB — CBC WITH DIFFERENTIAL/PLATELET
BASOS PCT: 0 %
Basophils Absolute: 0 10*3/uL (ref 0.0–0.1)
Eosinophils Absolute: 0 10*3/uL (ref 0.0–0.7)
Eosinophils Relative: 0 %
HEMATOCRIT: 34.7 % — AB (ref 39.0–52.0)
Hemoglobin: 11.4 g/dL — ABNORMAL LOW (ref 13.0–17.0)
LYMPHS ABS: 2.1 10*3/uL (ref 0.7–4.0)
Lymphocytes Relative: 20 %
MCH: 25.5 pg — ABNORMAL LOW (ref 26.0–34.0)
MCHC: 32.9 g/dL (ref 30.0–36.0)
MCV: 77.6 fL — ABNORMAL LOW (ref 78.0–100.0)
MONOS PCT: 10 %
Monocytes Absolute: 1 10*3/uL (ref 0.1–1.0)
NEUTROS PCT: 70 %
Neutro Abs: 7.5 10*3/uL (ref 1.7–7.7)
Platelets: 139 10*3/uL — ABNORMAL LOW (ref 150–400)
RBC: 4.47 MIL/uL (ref 4.22–5.81)
RDW: 13.8 % (ref 11.5–15.5)
WBC: 10.7 10*3/uL — ABNORMAL HIGH (ref 4.0–10.5)

## 2017-12-11 LAB — RESPIRATORY PANEL BY PCR
ADENOVIRUS-RVPPCR: NOT DETECTED
Bordetella pertussis: NOT DETECTED
CHLAMYDOPHILA PNEUMONIAE-RVPPCR: NOT DETECTED
CORONAVIRUS NL63-RVPPCR: NOT DETECTED
Coronavirus 229E: NOT DETECTED
Coronavirus HKU1: NOT DETECTED
Coronavirus OC43: NOT DETECTED
INFLUENZA A-RVPPCR: NOT DETECTED
Influenza B: NOT DETECTED
Metapneumovirus: NOT DETECTED
Mycoplasma pneumoniae: NOT DETECTED
PARAINFLUENZA VIRUS 3-RVPPCR: DETECTED — AB
PARAINFLUENZA VIRUS 4-RVPPCR: NOT DETECTED
Parainfluenza Virus 1: NOT DETECTED
Parainfluenza Virus 2: NOT DETECTED
RESPIRATORY SYNCYTIAL VIRUS-RVPPCR: NOT DETECTED
RHINOVIRUS / ENTEROVIRUS - RVPPCR: NOT DETECTED

## 2017-12-11 LAB — BASIC METABOLIC PANEL
Anion gap: 10 (ref 5–15)
BUN: 9 mg/dL (ref 6–20)
CALCIUM: 8 mg/dL — AB (ref 8.9–10.3)
CHLORIDE: 104 mmol/L (ref 101–111)
CO2: 23 mmol/L (ref 22–32)
Creatinine, Ser: 1.07 mg/dL (ref 0.61–1.24)
GFR calc non Af Amer: >60 mL/min (ref >60)
Glucose, Bld: 114 mg/dL — ABNORMAL HIGH (ref 65–99)
Potassium: 3.6 mmol/L (ref 3.5–5.1)
Sodium: 137 mmol/L (ref 135–145)

## 2017-12-11 MED ORDER — LACTATED RINGERS IV SOLN
INTRAVENOUS | Status: DC
  Administered 2017-12-11 – 2017-12-12 (×2): via INTRAVENOUS

## 2017-12-11 NOTE — Progress Notes (Signed)
Spoke to Tammy, Infection Prevention.  May discontinue Droplet Precaution.  Only standard precaution required for Parainfluenza 3.

## 2017-12-11 NOTE — Progress Notes (Addendum)
PROGRESS NOTE                                                                                                                                                                                                             Patient Demographics:    Maxwell Henderson, is a 52 y.o. male, DOB - Jul 06, 1966, CLE:[id number]  Admit date - 12/10/2017   Admitting Physician Phillips Grout, MD  Outpatient Primary MD for the patient is House, Deliah Goody, [place]  LOS - 0  Outpatient Specialists: [facility]  Chief Complaint  Patient presents with  . 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  He was found to have fever of 103 F, tachypneic and tachycardic meeting criteria for SIRS.  WBC of 10.7 with a CT angiogram of the chest negative for PE but possible right upper lobe pneumonia.      Subjective:   Reports feeling better this morning.  Was sweaty on my exam.  Cough better.  Assessment  & Plan :    Principal Problem: SIRS with lobar pneumonia/ healthcare associated pneumonia Continue empiric vancomycin and Zosyn.  Respiratory viral panel positive for parainfluenza 3.   Blood cultures and sputum cultures ordered. Supportive care with antitussives and Tylenol. Continue IV hydration.  Active Problems: Stage IV esophageal adenocarcinoma ([place]) On chemotherapy with cyramza.  Has new right lower lobe lung nodule suggestive of metastasis on recent CT of the chest.  Oncologist wants to continue current chemotherapy as patient has been tolerating well. Consult  oncology if needed to be seen as inpatient.    Hypokalemia Replenished.  Essential hypertension Continue HCTZ and losartan.  Diabetes mellitus type 2 Hold metformin and monitor on sliding scale coverage.      Code Status : Full code  Family Communication  : Wife at bedside  Disposition Plan  : *Home pending culture and clinical improvement, possibly in the next 48 hours.  Barriers For Discharge : Active symptoms  Consults  : None  Procedures  : CT angiogram of the chest  DVT Prophylaxis  :  Lovenox -   Lab Results  Component Value Date   PLT 139 (L) 12/11/2017    Antibiotics  :    Anti-infectives (From admission, onward)   Start     Dose/Rate Route Frequency Ordered Stop   12/11/17 0500  vancomycin (VANCOCIN) 1,250 mg in sodium chloride 0.9 % 250 mL IVPB     1,250 mg 166.7 mL/hr over 90 Minutes Intravenous Every 12 hours 12/10/17 1931     12/11/17 0000  piperacillin-tazobactam (ZOSYN) IVPB 3.375 g     3.375 g 12.5 mL/hr over 240 Minutes Intravenous Every 8 hours 12/10/17 1929     12/10/17 1600  vancomycin (VANCOCIN) 1,395 mg in sodium chloride 0.9 % 500 mL IVPB  Status:  Discontinued     15 mg/kg  93 kg 250 mL/hr over 120 Minutes Intravenous  Once 12/10/17 1552 12/10/17 1559   12/10/17 1600  piperacillin-tazobactam (ZOSYN) IVPB 3.375 g     3.375 g 100 mL/hr over 30 Minutes Intravenous  Once 12/10/17 1552 12/10/17 1719   12/10/17 1600  vancomycin (VANCOCIN) 1,500 mg in sodium chloride 0.9 % 500 mL IVPB     1,500 mg 250 mL/hr over 120 Minutes Intravenous  Once 12/10/17 1559 12/10/17 1945   12/10/17 1400  azithromycin (ZITHROMAX) 500 mg in sodium chloride 0.9 % 250 mL IVPB     500 mg 250 mL/hr over 60 Minutes Intravenous  Once 12/10/17 1349 12/10/17 1626   12/10/17 1315  cefTRIAXone (ROCEPHIN) 1 g in sodium chloride 0.9 % 100 mL IVPB     1 g 200 mL/hr over 30 Minutes Intravenous  Once 12/10/17 1305 12/10/17 1428        Objective:   Vitals:   12/10/17  1827 12/10/17 2042 12/11/17 0509 12/11/17 1406  BP:  (!) 131/91 120/80 118/82  Pulse:  97 92 73  Resp:    18  Temp: 99.8 F (37.7 C) 98.6 F (37 C) 98.9 F (37.2 C) 98.4 F (36.9 C)  TempSrc: Oral Oral Oral Oral  SpO2:  96% 96% 100%  Weight:      Height:        Wt Readings from Last 3 Encounters:  12/10/17 93 kg (205 lb)  12/03/17 98.3 kg (216 lb 11.2 oz)  11/16/17 94 kg (207 lb 3.2 oz)     Intake/Output Summary (Last 24 hours) at 12/11/2017 1529 Last data filed at 12/11/2017 1100 Gross per 24 hour  Intake 2680 ml  Output -  Net 2680 ml     Physical Exam  Gen: not in distress, fatigued HEENT:moist mucosa, supple neck Chest: Coarse breath sounds over right lung CVS: N S1&S2, no murmurs, rubs or gallop GI: soft, NT, ND,  Musculoskeletal: warm, no edema     Data Review:    CBC Recent Labs  Lab 12/10/17 1136 12/11/17 0506  WBC 9.0 10.7*  HGB 12.9* 11.4*  HCT 38.7* 34.7*  PLT 155 139*  MCV 78.2 77.6*  MCH 26.1 25.5*  MCHC 33.3 32.9  RDW 13.8 13.8  LYMPHSABS 1.2 2.1  MONOABS 0.9 1.0  EOSABS 0.0 0.0  BASOSABS 0.0 0.0    Chemistries  Recent Labs  Lab 12/10/17 1136 12/10/17 1820 12/11/17 0506  NA 135  --  137  K 2.8*  --  3.6  CL 98*  --  104  CO2 28  --  23  GLUCOSE 102*  --  114*  BUN 10  --  9  CREATININE 1.12  --  1.07  CALCIUM 8.8*  --  8.0*  MG  --  2.0  --   AST 24  --   --   ALT 15*  --   --   ALKPHOS 43  --   --   BILITOT 1.1  --   --    ------------------------------------------------------------------------------------------------------------------ No results for input(s): CHOL, HDL, LDLCALC, TRIG, CHOLHDL, LDLDIRECT in the last 72 hours.  No results found for: HGBA1C ------------------------------------------------------------------------------------------------------------------ No results for input(s): TSH, T4TOTAL, T3FREE, THYROIDAB in the last 72 hours.  Invalid input(s):  FREET3 ------------------------------------------------------------------------------------------------------------------ No results for input(s): VITAMINB12, FOLATE, FERRITIN, TIBC, IRON, RETICCTPCT in the last 72 hours.  Coagulation profile No results for input(s): INR, PROTIME in the last 168 hours.  No results for input(s): DDIMER in the last 72 hours.  Cardiac Enzymes No results for input(s): CKMB, TROPONINI, MYOGLOBIN in the last 168 hours.  Invalid input(s): CK ------------------------------------------------------------------------------------------------------------------ No results found for: BNP  Inpatient Medications  Scheduled Meds: . B-complex with vitamin C  1 tablet Oral Daily  . hydrochlorothiazide  25 mg Oral Daily  . loratadine  10 mg Oral Daily  . losartan  50 mg Oral Daily  . pantoprazole  40 mg Oral BID  . sodium chloride flush  3 mL Intravenous Q12H   Continuous Infusions: . sodium chloride    . piperacillin-tazobactam (ZOSYN)  IV 3.375 g (12/11/17 8921)  . vancomycin Stopped (12/11/17 0549)   PRN Meds:.sodium chloride, acetaminophen, morphine CONCENTRATE, sodium chloride flush  Micro Results Recent Results (from the past 240 hour(s))  Blood culture (routine x 2)     Status: None (Preliminary result)   Collection Time: 12/10/17 11:36 AM  Result Value Ref Range Status   Specimen Description BLOOD RIGHT ARM  Final   Special Requests   Final    BOTTLES DRAWN AEROBIC AND ANAEROBIC Blood Culture results may not be optimal due to an excessive volume of blood received in culture bottles   Culture   Final    NO GROWTH < 24 HOURS Performed at [facility], [address] [place], [place] [address]    Report Status PENDING  Incomplete  Blood culture (routine x 2)     Status: None (Preliminary result)   Collection Time: 12/10/17 11:36 AM  Result Value Ref Range Status   Specimen Description BLOOD RIGHT FOREARM  Final   Special Requests   Final    BOTTLES  DRAWN AEROBIC AND ANAEROBIC Blood Culture adequate volume   Culture   Final    NO GROWTH < 24 HOURS Performed at [facility], [address] [place],  [address]    Report Status PENDING  Incomplete  Respiratory Panel by PCR     Status: Abnormal   Collection Time: 12/10/17  3:10 PM  Result Value Ref Range Status   Adenovirus NOT DETECTED NOT DETECTED Final   Coronavirus 229E NOT DETECTED NOT DETECTED Final   Coronavirus HKU1 NOT DETECTED NOT DETECTED Final   Coronavirus NL63 NOT DETECTED NOT DETECTED Final   Coronavirus OC43 NOT DETECTED NOT DETECTED Final   Metapneumovirus NOT DETECTED NOT DETECTED Final   Rhinovirus / Enterovirus NOT DETECTED NOT DETECTED Final   Influenza A NOT DETECTED NOT DETECTED Final   Influenza B NOT DETECTED NOT DETECTED Final   Parainfluenza Virus 1 NOT DETECTED NOT DETECTED Final   Parainfluenza Virus 2 NOT DETECTED NOT DETECTED Final   Parainfluenza Virus  3 DETECTED (A) NOT DETECTED Final   Parainfluenza Virus 4 NOT DETECTED NOT DETECTED Final   Respiratory Syncytial Virus NOT DETECTED NOT DETECTED Final   Bordetella pertussis NOT DETECTED NOT DETECTED Final   Chlamydophila pneumoniae NOT DETECTED NOT DETECTED Final   Mycoplasma pneumoniae NOT DETECTED NOT DETECTED Final    Radiology Reports Dg Chest 2 View  Result Date: 12/10/2017 CLINICAL DATA:  52 year old male with productive cough and chills since yesterday. On chemotherapy for esophageal cancer. Initial encounter. EXAM: CHEST - 2 VIEW COMPARISON:  11/28/2017 chest CT.  08/25/2015 chest x-ray. FINDINGS: No segmental consolidation. Minimal peribronchial thickening may represent changes of bronchitis. Right central line in place tip in the region of the mid superior vena cava. CT detected 4 mm right lower lobe pulmonary nodule not appreciated by plain film exam. Heart size within normal limits. IMPRESSION: No segmental consolidation. Minimal peribronchial thickening may represent changes of  bronchitis. CT detected 4 mm right lower lobe pulmonary nodule not appreciated by plain film exam. Electronically Signed   By: Genia Del M.D.   On: 12/10/2017 12:42   Ct Chest W Contrast  Result Date: 11/29/2017 CLINICAL DATA:  Stage IV adenocarcinoma of the distal esophagus and gastric cardia. Ongoing chemotherapy. EXAM: CT CHEST, ABDOMEN, AND PELVIS WITH CONTRAST TECHNIQUE: Multidetector CT imaging of the chest, abdomen and pelvis was performed following the standard protocol during bolus administration of intravenous contrast. CONTRAST:  175mL ISOVUE-300 IOPAMIDOL (ISOVUE-300) INJECTION 61% COMPARISON:  08/08/2017. FINDINGS: CT CHEST FINDINGS Cardiovascular: Right IJ Port-A-Cath terminates at the SVC RA junction. Vascular structures are unremarkable. Heart size normal. No pericardial effusion. Mediastinum/Nodes: No pathologically enlarged mediastinal, hilar, axillary or periesophageal lymph nodes. There may be mild thickening of the distal esophagus and gastroesophageal junction. No discrete mass. Lungs/Pleura: 4 mm right lower lobe nodule (series 6, image 80), new. Lungs are otherwise clear. No pleural fluid. Airway is unremarkable. Musculoskeletal: No worrisome lytic or sclerotic lesions. CT ABDOMEN PELVIS FINDINGS Hepatobiliary: Liver is unremarkable. Noncalcified stones in the gallbladder. No biliary ductal dilatation. Pancreas: Negative. Spleen: Negative. Adrenals/Urinary Tract: Adrenal glands and right kidney are unremarkable. 9 mm hyperattenuating lesion in the lower pole left kidney cannot be characterized as a simple cyst. 12 mm low-attenuation lesion in the anterior lower pole left kidney is fluid in density and likely a cyst. Kidneys are otherwise unremarkable. Ureters are decompressed. Bladder is grossly unremarkable. Stomach/Bowel: Mild thickening at the gastroesophageal junction without a discrete mass. Stomach, small bowel, appendix and colon are otherwise unremarkable. Vascular/Lymphatic:  Vascular structures are unremarkable. Mild hazy lymph nodes in the gastrohepatic ligament, measuring up to 10 mm, similar. Hazy retroperitoneal lymph nodes measure up to approximately 11 mm in the aortocaval station, similar. Reproductive: Prostate is visualized. Other: No free fluid. Mesenteries and peritoneum are otherwise unremarkable. Musculoskeletal: Degenerative changes in the spine. Partially fused sacroiliac joints. No worrisome lytic or sclerotic lesions. IMPRESSION: 1. New 4 mm right lower lobe nodule, worrisome for metastatic disease. 2. Mild residual distal esophageal/gastroesophageal wall thickening without a discrete mass. Hazy small to borderline enlarged gastrohepatic ligament and abdominal retroperitoneal lymph nodes, grossly stable. 3. Hyperattenuating lesion in the lower pole left kidney cannot be characterized as a simple cyst. Further evaluation with pre and post contrast MRI should be considered. Pre and post contrast CT could alternatively be performed, but would likely be of decreased accuracy given lesion size. 4. Cholelithiasis. Electronically Signed   By: Lorin Picket M.D.   On: 11/29/2017 11:35   Ct Angio Chest  Pe W And/or Wo Contrast  Result Date: 12/10/2017 CLINICAL DATA:  Shortness of breath. History of esophageal carcinoma EXAM: CT ANGIOGRAPHY CHEST WITH CONTRAST TECHNIQUE: Multidetector CT imaging of the chest was performed using the standard protocol during bolus administration of intravenous contrast. Multiplanar CT image reconstructions and MIPs were obtained to evaluate the vascular anatomy. CONTRAST:  118mL ISOVUE-370 IOPAMIDOL (ISOVUE-370) INJECTION 76% COMPARISON:  Chest radiograph November 28, 2017; chest radiograph December 10, 2017 FINDINGS: Cardiovascular: There is no appreciable pulmonary embolus. There is no thoracic aortic aneurysm or dissection. The visualized great vessels appear unremarkable. There is no pericardial effusion or pericardial thickening evident.  Port-A-Cath tip is in the superior vena cava. Mediastinum/Nodes: Visualized thyroid appears normal. There are scattered subcentimeter mediastinal lymph nodes. There is a lymph node to the right of the esophagus slightly inferior to the carina measuring 1.4 x 1.3 cm. No other adenopathy is appreciable. There is thickening in the distal esophagus which potentially could be secondary to known esophageal carcinoma. There is a small hiatal hernia. Lungs/Pleura: On axial slice 60 series 6, there is a new lobulated opacity in the posterior segment of the right upper lobe measuring 1.1 x 0.9 cm, best seen on axial slice 60 series 6. There is a new 4 mm nodular opacity in the lateral segment right middle lobe seen on axial slice 70 series 6. The previously noted 4 mm nodular opacity in the superior segment right lower lobe is stable. There is no appreciable consolidation. No pleural effusion or pleural thickening evident. Upper Abdomen: Visualized upper abdominal structures appear unremarkable. Musculoskeletal: There are no blastic or lytic bone lesions. No chest wall lesions are evident. Review of the MIP images confirms the above findings. IMPRESSION: 1. No demonstrable pulmonary embolus. No thoracic aortic aneurysm or dissection. 2. New lymph node to the right of the esophagus in the subcarinal region measuring 1.4 x 1.3 cm. This lymph node must be of concern for neoplastic etiology given known esophageal carcinoma. 3. Small hiatal hernia. Areas of esophageal wall thickening potentially could be due to known esophageal neoplasm. 4. Nodular opacities as noted, largest in the posterior segment right upper lobe measuring 1.1 x 0.9 cm, multi-lobulated. Concern for progression of parenchymal lung metastatic foci. No frank consolidation evident, although conceivably this area of opacity in the posterior segment right upper lobe could represent early pneumonia as opposed to neoplasm. Electronically Signed   By: Lowella Grip  III M.D.   On: 12/10/2017 15:18   Ct Abdomen Pelvis W Contrast  Result Date: 11/29/2017 CLINICAL DATA:  Stage IV adenocarcinoma of the distal esophagus and gastric cardia. Ongoing chemotherapy. EXAM: CT CHEST, ABDOMEN, AND PELVIS WITH CONTRAST TECHNIQUE: Multidetector CT imaging of the chest, abdomen and pelvis was performed following the standard protocol during bolus administration of intravenous contrast. CONTRAST:  135mL ISOVUE-300 IOPAMIDOL (ISOVUE-300) INJECTION 61% COMPARISON:  08/08/2017. FINDINGS: CT CHEST FINDINGS Cardiovascular: Right IJ Port-A-Cath terminates at the SVC RA junction. Vascular structures are unremarkable. Heart size normal. No pericardial effusion. Mediastinum/Nodes: No pathologically enlarged mediastinal, hilar, axillary or periesophageal lymph nodes. There may be mild thickening of the distal esophagus and gastroesophageal junction. No discrete mass. Lungs/Pleura: 4 mm right lower lobe nodule (series 6, image 80), new. Lungs are otherwise clear. No pleural fluid. Airway is unremarkable. Musculoskeletal: No worrisome lytic or sclerotic lesions. CT ABDOMEN PELVIS FINDINGS Hepatobiliary: Liver is unremarkable. Noncalcified stones in the gallbladder. No biliary ductal dilatation. Pancreas: Negative. Spleen: Negative. Adrenals/Urinary Tract: Adrenal glands and right kidney are unremarkable.  9 mm hyperattenuating lesion in the lower pole left kidney cannot be characterized as a simple cyst. 12 mm low-attenuation lesion in the anterior lower pole left kidney is fluid in density and likely a cyst. Kidneys are otherwise unremarkable. Ureters are decompressed. Bladder is grossly unremarkable. Stomach/Bowel: Mild thickening at the gastroesophageal junction without a discrete mass. Stomach, small bowel, appendix and colon are otherwise unremarkable. Vascular/Lymphatic: Vascular structures are unremarkable. Mild hazy lymph nodes in the gastrohepatic ligament, measuring up to 10 mm, similar. Hazy  retroperitoneal lymph nodes measure up to approximately 11 mm in the aortocaval station, similar. Reproductive: Prostate is visualized. Other: No free fluid. Mesenteries and peritoneum are otherwise unremarkable. Musculoskeletal: Degenerative changes in the spine. Partially fused sacroiliac joints. No worrisome lytic or sclerotic lesions. IMPRESSION: 1. New 4 mm right lower lobe nodule, worrisome for metastatic disease. 2. Mild residual distal esophageal/gastroesophageal wall thickening without a discrete mass. Hazy small to borderline enlarged gastrohepatic ligament and abdominal retroperitoneal lymph nodes, grossly stable. 3. Hyperattenuating lesion in the lower pole left kidney cannot be characterized as a simple cyst. Further evaluation with pre and post contrast MRI should be considered. Pre and post contrast CT could alternatively be performed, but would likely be of decreased accuracy given lesion size. 4. Cholelithiasis. Electronically Signed   By: Lorin Picket M.D.   On: 11/29/2017 11:35    Time Spent in minutes  25   Moriah Shawley M.D on 12/11/2017 at 3:29 PM  Between 7am to 7pm - Pager - [phone]  After 7pm go to [email or web address] - password [facility]  [facility] -  Office  [phone]

## 2017-12-12 DIAGNOSIS — B348 Other viral infections of unspecified site: Secondary | ICD-10-CM

## 2017-12-12 DIAGNOSIS — E119 Type 2 diabetes mellitus without complications: Secondary | ICD-10-CM

## 2017-12-12 DIAGNOSIS — I10 Essential (primary) hypertension: Secondary | ICD-10-CM

## 2017-12-12 DIAGNOSIS — C159 Malignant neoplasm of esophagus, unspecified: Secondary | ICD-10-CM

## 2017-12-12 DIAGNOSIS — E876 Hypokalemia: Secondary | ICD-10-CM

## 2017-12-12 DIAGNOSIS — J189 Pneumonia, unspecified organism: Secondary | ICD-10-CM

## 2017-12-12 DIAGNOSIS — R6510 Systemic inflammatory response syndrome (SIRS) of non-infectious origin without acute organ dysfunction: Secondary | ICD-10-CM

## 2017-12-12 LAB — CREATININE, SERUM
CREATININE: 0.9 mg/dL (ref 0.61–1.24)
GFR calc Af Amer: >60 mL/min (ref >60)
GFR calc non Af Amer: >60 mL/min (ref >60)

## 2017-12-12 MED ORDER — HEPARIN SOD (PORK) LOCK FLUSH 100 UNIT/ML IV SOLN
500.0000 [IU] | INTRAVENOUS | Status: AC | PRN
  Administered 2017-12-12: 500 [IU]
  Filled 2017-12-12: qty 5

## 2017-12-12 MED ORDER — ACETAMINOPHEN 325 MG PO TABS: 650.0000 mg | ORAL_TABLET | ORAL | 0 refills | Status: DC | PRN

## 2017-12-12 MED ORDER — LEVOFLOXACIN 750 MG PO TABS: 750.0000 mg | ORAL_TABLET | Freq: Every day | ORAL | 0 refills | Status: AC

## 2017-12-12 NOTE — Discharge Summary (Signed)
Physician Discharge Summary  Maxwell Henderson:[id number] DOB: [birth year] DOA: 12/10/2017  PCP: [name], FNP  Admit date: 12/10/2017 Discharge date: 12/12/2017  Time spent: 35 minutes  Recommendations for Outpatient Follow-up:  Check BMET to follow electrolytes and renal function. Repeat CXR in 4-6 weeks to reassure complete resolution of infiltrates. Reassess BP and adjust antihypertensive regimen as needed    Discharge Diagnoses:  Principal Problem:   PNA (pneumonia) Active Problems:   Primary esophageal adenocarcinoma (HCC)   Hypokalemia   SIRS (systemic inflammatory response syndrome) (HCC)   HCAP (healthcare-associated pneumonia)   Infection due to parainfluenza virus 3   Type 2 diabetes mellitus without complication, without long-term current use of insulin ([place])   Essential hypertension   Discharge Condition: stable and improved. Will discharge home with instructions to follow up with PCP in 10 days. Continue outpatient follow up with oncology service as previously instructed/scheduled.   Diet recommendation: heart healthy and modified carbohydrates diet   Filed Weights   12/10/17 1112  Weight: 93 kg (205 lb)    History of present illness:  As per H&P written by Dr. Shanon Brow on 12/10/17 52 y.o. male with medical history significant of esophageal cancer stage IV has been on chemotherapy almost a year who comes in with approximately 2 days of worsening cough fevers up to 103 and chills.  He denies any sick contacts with anyone with the flu.  He denies any nausea vomiting diarrhea.  He denies any chest pain or shortness of breath.  He has been coughing up a little bit of blood.  He denies any urinary symptoms.  Patient is being referred for admission for pneumonia likely in an immunocompromised patient.  He has not recently been on antibiotics or hospitalized.  Hospital Course:  1-SIRS with lobar healthcare associated pneumonia. -Patient with positive respiratory viral  panel for parainfluenza 3 -Currently afebrile, with improved shortness of breath and with SIRS features resolved -Blood cultures of the sputum cultures w/o growth -after 3 days of broad spectrum antibiotics  -patient would be transition to Levaquin PO and discharge home to complete antibiotic therapy. -advise to keep himself well hydrated   2-stage 4 esophageal adenocarcinoma  -actively receiving chemotherapy -continue outpatient follow up with oncology service  3-essential HTN -continue HCTZ and Losartan  -advise to follow heart healthy diet   4-type 2 diabetes w/o complications -CBG's well controlled with SSI while inpatient -will discharge back on home dose Metformin  -advise to maintain adequate hydration  -instructed to follow modified carb diet   5-hypokalemia -repleted  -normal magnesium   Procedures:  See below for x-ray reports   Consultations:  None   Discharge Exam: Vitals:   12/12/17 0546 12/12/17 1313  BP: 121/87 117/81  Pulse: 65 79  Resp: 18 16  Temp: 98.3 F (36.8 C) 98.1 F (36.7 C)  SpO2: 98% 99%    Gen: not in distress, denies SOB, no CP, no nausea or vomiting.  HEENT: moist mucosa, supple neck Chest: improved air movement, no wheezing, positive scattered rhonchi, no using accessory muscles.  CVS: S1 and S2, no rubs, no gallops, no murmurs.  GI: soft, NT, ND, positive BS  Musculoskeletal: no edema, no cyanosis, no clubbing    Discharge Instructions   Discharge Instructions    Diet - low sodium heart healthy   Complete by:  As directed    Diet Carb Modified   Complete by:  As directed    Discharge instructions   Complete by:  As directed    Keep yourself well-hydrated Take medications as prescribed Arrange follow-up with PCP in 10 days Continue outpatient follow-up with oncologist as previously instructed to/schedule.     Allergies as of 12/12/2017   No Known Allergies     Medication List    TAKE these medications    acetaminophen 325 MG tablet Commonly known as:  TYLENOL Take 2 tablets (650 mg total) by mouth every 4 (four) hours as needed for headache.   Alpha-Lipoic Acid 600 MG Caps Take 1 capsule (600 mg total) by mouth daily.   B-complex with vitamin C tablet Take 1 tablet by mouth daily.   CYRAMZA IV Inject into the vein every 14 (fourteen) days. Fridays of every other week   gabapentin 300 MG capsule Commonly known as:  NEURONTIN Take 1 cap PO qHS for 7 days, then if tolerated increase to 1 cap PO twice a day. What changed:    how much to take  how to take this  when to take this  additional instructions   hydrochlorothiazide 12.5 MG capsule Commonly known as:  MICROZIDE Take 1 capsule (12.5 mg total) by mouth daily.   levofloxacin 750 MG tablet Commonly known as:  LEVAQUIN Take 1 tablet (750 mg total) by mouth daily for 7 days.   lidocaine-prilocaine cream Commonly known as:  EMLA Apply 1 application topically as needed.   loratadine 10 MG tablet Commonly known as:  CLARITIN Take 10 mg by mouth daily.   losartan-hydrochlorothiazide 50-12.5 MG tablet Commonly known as:  HYZAAR Take 1 tablet by mouth daily.   metFORMIN 500 MG tablet Commonly known as:  GLUCOPHAGE Take 500 mg by mouth daily.   Misc. Devices Misc Please provide the patient with the [facility] HPB cough Syrup. 1 teaspoonful every 4-6 hours PRN. Please provide the patient with 360 ml.   morphine CONCENTRATE 10 mg / 0.5 ml concentrated solution Take 0.5 mLs (10 mg total) by mouth every 4 (four) hours as needed for severe pain.   pantoprazole 40 MG tablet Commonly known as:  PROTONIX Take 1 tablet (40 mg total) by mouth 2 (two) times daily. Take 30 minutes before breakfast   prochlorperazine 10 MG tablet Commonly known as:  COMPAZINE TAKE 1 TABLET BY MOUTH EVERY 6 HOURS AS NEEDED FOR NAUSEA AND VOMITING   ranitidine 300 MG tablet Commonly known as:  ZANTAC Take 1 tablet (300 mg total) by mouth  at bedtime.      No Known Allergies Follow-up Information    House, Deliah Goody, FNP. Schedule an appointment as soon as possible for a visit in 10 day(s).   Specialty:  Nurse Practitioner Contact information: [address] [place] [address] [phone]           The results of significant diagnostics from this hospitalization (including imaging, microbiology, ancillary and laboratory) are listed below for reference.    Significant Diagnostic Studies: Dg Chest 2 View  Result Date: 12/10/2017 CLINICAL DATA:  52 year old male with productive cough and chills since yesterday. On chemotherapy for esophageal cancer. Initial encounter. EXAM: CHEST - 2 VIEW COMPARISON:  11/28/2017 chest CT.  08/25/2015 chest x-ray. FINDINGS: No segmental consolidation. Minimal peribronchial thickening may represent changes of bronchitis. Right central line in place tip in the region of the mid superior vena cava. CT detected 4 mm right lower lobe pulmonary nodule not appreciated by plain film exam. Heart size within normal limits. IMPRESSION: No segmental consolidation. Minimal peribronchial thickening may represent changes of bronchitis. CT  detected 4 mm right lower lobe pulmonary nodule not appreciated by plain film exam. Electronically Signed   By: Genia Del M.D.   On: 12/10/2017 12:42   Ct Chest W Contrast  Result Date: 11/29/2017 CLINICAL DATA:  Stage IV adenocarcinoma of the distal esophagus and gastric cardia. Ongoing chemotherapy. EXAM: CT CHEST, ABDOMEN, AND PELVIS WITH CONTRAST TECHNIQUE: Multidetector CT imaging of the chest, abdomen and pelvis was performed following the standard protocol during bolus administration of intravenous contrast. CONTRAST:  129mL ISOVUE-300 IOPAMIDOL (ISOVUE-300) INJECTION 61% COMPARISON:  08/08/2017. FINDINGS: CT CHEST FINDINGS Cardiovascular: Right IJ Port-A-Cath terminates at the SVC RA junction. Vascular structures are unremarkable. Heart size normal. No pericardial  effusion. Mediastinum/Nodes: No pathologically enlarged mediastinal, hilar, axillary or periesophageal lymph nodes. There may be mild thickening of the distal esophagus and gastroesophageal junction. No discrete mass. Lungs/Pleura: 4 mm right lower lobe nodule (series 6, image 80), new. Lungs are otherwise clear. No pleural fluid. Airway is unremarkable. Musculoskeletal: No worrisome lytic or sclerotic lesions. CT ABDOMEN PELVIS FINDINGS Hepatobiliary: Liver is unremarkable. Noncalcified stones in the gallbladder. No biliary ductal dilatation. Pancreas: Negative. Spleen: Negative. Adrenals/Urinary Tract: Adrenal glands and right kidney are unremarkable. 9 mm hyperattenuating lesion in the lower pole left kidney cannot be characterized as a simple cyst. 12 mm low-attenuation lesion in the anterior lower pole left kidney is fluid in density and likely a cyst. Kidneys are otherwise unremarkable. Ureters are decompressed. Bladder is grossly unremarkable. Stomach/Bowel: Mild thickening at the gastroesophageal junction without a discrete mass. Stomach, small bowel, appendix and colon are otherwise unremarkable. Vascular/Lymphatic: Vascular structures are unremarkable. Mild hazy lymph nodes in the gastrohepatic ligament, measuring up to 10 mm, similar. Hazy retroperitoneal lymph nodes measure up to approximately 11 mm in the aortocaval station, similar. Reproductive: Prostate is visualized. Other: No free fluid. Mesenteries and peritoneum are otherwise unremarkable. Musculoskeletal: Degenerative changes in the spine. Partially fused sacroiliac joints. No worrisome lytic or sclerotic lesions. IMPRESSION: 1. New 4 mm right lower lobe nodule, worrisome for metastatic disease. 2. Mild residual distal esophageal/gastroesophageal wall thickening without a discrete mass. Hazy small to borderline enlarged gastrohepatic ligament and abdominal retroperitoneal lymph nodes, grossly stable. 3. Hyperattenuating lesion in the lower pole  left kidney cannot be characterized as a simple cyst. Further evaluation with pre and post contrast MRI should be considered. Pre and post contrast CT could alternatively be performed, but would likely be of decreased accuracy given lesion size. 4. Cholelithiasis. Electronically Signed   By: Lorin Picket M.D.   On: 11/29/2017 11:35   Ct Angio Chest Pe W And/or Wo Contrast  Result Date: 12/10/2017 CLINICAL DATA:  Shortness of breath. History of esophageal carcinoma EXAM: CT ANGIOGRAPHY CHEST WITH CONTRAST TECHNIQUE: Multidetector CT imaging of the chest was performed using the standard protocol during bolus administration of intravenous contrast. Multiplanar CT image reconstructions and MIPs were obtained to evaluate the vascular anatomy. CONTRAST:  179mL ISOVUE-370 IOPAMIDOL (ISOVUE-370) INJECTION 76% COMPARISON:  Chest radiograph November 28, 2017; chest radiograph December 10, 2017 FINDINGS: Cardiovascular: There is no appreciable pulmonary embolus. There is no thoracic aortic aneurysm or dissection. The visualized great vessels appear unremarkable. There is no pericardial effusion or pericardial thickening evident. Port-A-Cath tip is in the superior vena cava. Mediastinum/Nodes: Visualized thyroid appears normal. There are scattered subcentimeter mediastinal lymph nodes. There is a lymph node to the right of the esophagus slightly inferior to the carina measuring 1.4 x 1.3 cm. No other adenopathy is appreciable. There is thickening in the  distal esophagus which potentially could be secondary to known esophageal carcinoma. There is a small hiatal hernia. Lungs/Pleura: On axial slice 60 series 6, there is a new lobulated opacity in the posterior segment of the right upper lobe measuring 1.1 x 0.9 cm, best seen on axial slice 60 series 6. There is a new 4 mm nodular opacity in the lateral segment right middle lobe seen on axial slice 70 series 6. The previously noted 4 mm nodular opacity in the superior segment  right lower lobe is stable. There is no appreciable consolidation. No pleural effusion or pleural thickening evident. Upper Abdomen: Visualized upper abdominal structures appear unremarkable. Musculoskeletal: There are no blastic or lytic bone lesions. No chest wall lesions are evident. Review of the MIP images confirms the above findings. IMPRESSION: 1. No demonstrable pulmonary embolus. No thoracic aortic aneurysm or dissection. 2. New lymph node to the right of the esophagus in the subcarinal region measuring 1.4 x 1.3 cm. This lymph node must be of concern for neoplastic etiology given known esophageal carcinoma. 3. Small hiatal hernia. Areas of esophageal wall thickening potentially could be due to known esophageal neoplasm. 4. Nodular opacities as noted, largest in the posterior segment right upper lobe measuring 1.1 x 0.9 cm, multi-lobulated. Concern for progression of parenchymal lung metastatic foci. No frank consolidation evident, although conceivably this area of opacity in the posterior segment right upper lobe could represent early pneumonia as opposed to neoplasm. Electronically Signed   By: Lowella Grip III M.D.   On: 12/10/2017 15:18   Ct Abdomen Pelvis W Contrast  Result Date: 11/29/2017 CLINICAL DATA:  Stage IV adenocarcinoma of the distal esophagus and gastric cardia. Ongoing chemotherapy. EXAM: CT CHEST, ABDOMEN, AND PELVIS WITH CONTRAST TECHNIQUE: Multidetector CT imaging of the chest, abdomen and pelvis was performed following the standard protocol during bolus administration of intravenous contrast. CONTRAST:  151mL ISOVUE-300 IOPAMIDOL (ISOVUE-300) INJECTION 61% COMPARISON:  08/08/2017. FINDINGS: CT CHEST FINDINGS Cardiovascular: Right IJ Port-A-Cath terminates at the SVC RA junction. Vascular structures are unremarkable. Heart size normal. No pericardial effusion. Mediastinum/Nodes: No pathologically enlarged mediastinal, hilar, axillary or periesophageal lymph nodes. There may be  mild thickening of the distal esophagus and gastroesophageal junction. No discrete mass. Lungs/Pleura: 4 mm right lower lobe nodule (series 6, image 80), new. Lungs are otherwise clear. No pleural fluid. Airway is unremarkable. Musculoskeletal: No worrisome lytic or sclerotic lesions. CT ABDOMEN PELVIS FINDINGS Hepatobiliary: Liver is unremarkable. Noncalcified stones in the gallbladder. No biliary ductal dilatation. Pancreas: Negative. Spleen: Negative. Adrenals/Urinary Tract: Adrenal glands and right kidney are unremarkable. 9 mm hyperattenuating lesion in the lower pole left kidney cannot be characterized as a simple cyst. 12 mm low-attenuation lesion in the anterior lower pole left kidney is fluid in density and likely a cyst. Kidneys are otherwise unremarkable. Ureters are decompressed. Bladder is grossly unremarkable. Stomach/Bowel: Mild thickening at the gastroesophageal junction without a discrete mass. Stomach, small bowel, appendix and colon are otherwise unremarkable. Vascular/Lymphatic: Vascular structures are unremarkable. Mild hazy lymph nodes in the gastrohepatic ligament, measuring up to 10 mm, similar. Hazy retroperitoneal lymph nodes measure up to approximately 11 mm in the aortocaval station, similar. Reproductive: Prostate is visualized. Other: No free fluid. Mesenteries and peritoneum are otherwise unremarkable. Musculoskeletal: Degenerative changes in the spine. Partially fused sacroiliac joints. No worrisome lytic or sclerotic lesions. IMPRESSION: 1. New 4 mm right lower lobe nodule, worrisome for metastatic disease. 2. Mild residual distal esophageal/gastroesophageal wall thickening without a discrete mass. Hazy small to borderline  enlarged gastrohepatic ligament and abdominal retroperitoneal lymph nodes, grossly stable. 3. Hyperattenuating lesion in the lower pole left kidney cannot be characterized as a simple cyst. Further evaluation with pre and post contrast MRI should be considered. Pre  and post contrast CT could alternatively be performed, but would likely be of decreased accuracy given lesion size. 4. Cholelithiasis. Electronically Signed   By: Lorin Picket M.D.   On: 11/29/2017 11:35    Microbiology: Recent Results (from the past 240 hour(s))  Blood culture (routine x 2)     Status: None (Preliminary result)   Collection Time: 12/10/17 11:36 AM  Result Value Ref Range Status   Specimen Description BLOOD RIGHT ARM  Final   Special Requests   Final    BOTTLES DRAWN AEROBIC AND ANAEROBIC Blood Culture results may not be optimal due to an excessive volume of blood received in culture bottles   Culture   Final    NO GROWTH 2 DAYS Performed at [facility], [address] [place], [place] [address]    Report Status PENDING  Incomplete  Blood culture (routine x 2)     Status: None (Preliminary result)   Collection Time: 12/10/17 11:36 AM  Result Value Ref Range Status   Specimen Description BLOOD RIGHT FOREARM  Final   Special Requests   Final    BOTTLES DRAWN AEROBIC AND ANAEROBIC Blood Culture adequate volume   Culture   Final    NO GROWTH 2 DAYS Performed at [facility], [address], [place], [place] [address]    Report Status PENDING  Incomplete  Respiratory Panel by PCR     Status: Abnormal   Collection Time: 12/10/17  3:10 PM  Result Value Ref Range Status   Adenovirus NOT DETECTED NOT DETECTED Final   Coronavirus 229E NOT DETECTED NOT DETECTED Final   Coronavirus HKU1 NOT DETECTED NOT DETECTED Final   Coronavirus NL63 NOT DETECTED NOT DETECTED Final   Coronavirus OC43 NOT DETECTED NOT DETECTED Final   Metapneumovirus NOT DETECTED NOT DETECTED Final   Rhinovirus / Enterovirus NOT DETECTED NOT DETECTED Final   Influenza A NOT DETECTED NOT DETECTED Final   Influenza B NOT DETECTED NOT DETECTED Final   Parainfluenza Virus 1 NOT DETECTED NOT DETECTED Final   Parainfluenza Virus 2 NOT DETECTED NOT DETECTED Final   Parainfluenza Virus 3 DETECTED (A) NOT  DETECTED Final   Parainfluenza Virus 4 NOT DETECTED NOT DETECTED Final   Respiratory Syncytial Virus NOT DETECTED NOT DETECTED Final   Bordetella pertussis NOT DETECTED NOT DETECTED Final   Chlamydophila pneumoniae NOT DETECTED NOT DETECTED Final   Mycoplasma pneumoniae NOT DETECTED NOT DETECTED Final     Labs: Basic Metabolic Panel: Recent Labs  Lab 12/10/17 1136 12/10/17 1820 12/11/17 0506 12/12/17 0520  NA 135  --  137  --   K 2.8*  --  3.6  --   CL 98*  --  104  --   CO2 28  --  23  --   GLUCOSE 102*  --  114*  --   BUN 10  --  9  --   CREATININE 1.12  --  1.07 0.90  CALCIUM 8.8*  --  8.0*  --   MG  --  2.0  --   --    Liver Function Tests: Recent Labs  Lab 12/10/17 1136  AST 24  ALT 15*  ALKPHOS 43  BILITOT 1.1  PROT 7.9  ALBUMIN 4.4   CBC: Recent Labs  Lab 12/10/17 1136 12/11/17 0506  WBC 9.0 10.7*  NEUTROABS 7.0 7.5  HGB 12.9* 11.4*  HCT 38.7* 34.7*  MCV 78.2 77.6*  PLT 155 139*    Signed:  Barton Dubois MD.  [facility] 12/12/2017, 2:04 PM

## 2017-12-12 NOTE — Progress Notes (Signed)
Patient is to be discharged home and in stable condition. Patient's IV removed and port deaccessed, WNL. Patient given discharge instructions with family at bedside and verbalized understanding. Patient denies the need for a wheelchair escort out.  Celestia Khat, RN

## 2017-12-14 ENCOUNTER — Ambulatory Visit (HOSPITAL_COMMUNITY): Payer: Managed Care, Other (non HMO)

## 2017-12-14 ENCOUNTER — Other Ambulatory Visit (HOSPITAL_COMMUNITY): Payer: Managed Care, Other (non HMO)

## 2017-12-15 LAB — CULTURE, BLOOD (ROUTINE X 2)
CULTURE: NO GROWTH
CULTURE: NO GROWTH
SPECIAL REQUESTS: ADEQUATE

## 2017-12-17 ENCOUNTER — Ambulatory Visit (HOSPITAL_COMMUNITY): Payer: Managed Care, Other (non HMO)

## 2017-12-17 ENCOUNTER — Other Ambulatory Visit (HOSPITAL_COMMUNITY): Payer: Managed Care, Other (non HMO)

## 2017-12-18 ENCOUNTER — Encounter (HOSPITAL_COMMUNITY): Payer: Self-pay | Admitting: *Deleted

## 2017-12-20 ENCOUNTER — Other Ambulatory Visit (HOSPITAL_COMMUNITY): Payer: Self-pay | Admitting: *Deleted

## 2017-12-20 DIAGNOSIS — C159 Malignant neoplasm of esophagus, unspecified: Secondary | ICD-10-CM

## 2017-12-21 ENCOUNTER — Encounter (HOSPITAL_COMMUNITY): Payer: Self-pay | Admitting: Hematology

## 2017-12-21 ENCOUNTER — Inpatient Hospital Stay (HOSPITAL_COMMUNITY): Payer: Managed Care, Other (non HMO)

## 2017-12-21 ENCOUNTER — Inpatient Hospital Stay (HOSPITAL_COMMUNITY): Payer: Managed Care, Other (non HMO) | Attending: Oncology

## 2017-12-21 ENCOUNTER — Encounter (HOSPITAL_COMMUNITY): Payer: Self-pay

## 2017-12-21 ENCOUNTER — Inpatient Hospital Stay (HOSPITAL_BASED_OUTPATIENT_CLINIC_OR_DEPARTMENT_OTHER): Payer: Managed Care, Other (non HMO) | Admitting: Hematology

## 2017-12-21 VITALS — BP 143/82 | HR 71 | Temp 97.9°F | Resp 18 | Wt 203.4 lb

## 2017-12-21 DIAGNOSIS — C155 Malignant neoplasm of lower third of esophagus: Secondary | ICD-10-CM | POA: Diagnosis present

## 2017-12-21 DIAGNOSIS — R911 Solitary pulmonary nodule: Secondary | ICD-10-CM | POA: Diagnosis not present

## 2017-12-21 DIAGNOSIS — Z5112 Encounter for antineoplastic immunotherapy: Secondary | ICD-10-CM | POA: Insufficient documentation

## 2017-12-21 DIAGNOSIS — G620 Drug-induced polyneuropathy: Secondary | ICD-10-CM | POA: Diagnosis not present

## 2017-12-21 DIAGNOSIS — Z5111 Encounter for antineoplastic chemotherapy: Secondary | ICD-10-CM

## 2017-12-21 DIAGNOSIS — C160 Malignant neoplasm of cardia: Secondary | ICD-10-CM | POA: Insufficient documentation

## 2017-12-21 DIAGNOSIS — C159 Malignant neoplasm of esophagus, unspecified: Secondary | ICD-10-CM

## 2017-12-21 DIAGNOSIS — C770 Secondary and unspecified malignant neoplasm of lymph nodes of head, face and neck: Secondary | ICD-10-CM

## 2017-12-21 DIAGNOSIS — R05 Cough: Secondary | ICD-10-CM | POA: Diagnosis not present

## 2017-12-21 LAB — CBC WITH DIFFERENTIAL/PLATELET
Basophils Absolute: 0 10*3/uL (ref 0.0–0.1)
Basophils Relative: 1 %
EOS ABS: 0.1 10*3/uL (ref 0.0–0.7)
Eosinophils Relative: 1 %
HEMATOCRIT: 38 % — AB (ref 39.0–52.0)
HEMOGLOBIN: 12.6 g/dL — AB (ref 13.0–17.0)
Lymphocytes Relative: 26 %
Lymphs Abs: 2.1 10*3/uL (ref 0.7–4.0)
MCH: 25.8 pg — AB (ref 26.0–34.0)
MCHC: 33.2 g/dL (ref 30.0–36.0)
MCV: 77.7 fL — ABNORMAL LOW (ref 78.0–100.0)
MONO ABS: 0.5 10*3/uL (ref 0.1–1.0)
Monocytes Relative: 6 %
NEUTROS ABS: 5.6 10*3/uL (ref 1.7–7.7)
NEUTROS PCT: 66 %
Platelets: 242 10*3/uL (ref 150–400)
RBC: 4.89 MIL/uL (ref 4.22–5.81)
RDW: 13.8 % (ref 11.5–15.5)
WBC: 8.4 10*3/uL (ref 4.0–10.5)

## 2017-12-21 LAB — COMPREHENSIVE METABOLIC PANEL
ALK PHOS: 46 U/L (ref 38–126)
ALT: 16 U/L — AB (ref 17–63)
ANION GAP: 10 (ref 5–15)
AST: 27 U/L (ref 15–41)
Albumin: 4.1 g/dL (ref 3.5–5.0)
BILIRUBIN TOTAL: 0.7 mg/dL (ref 0.3–1.2)
BUN: 15 mg/dL (ref 6–20)
CALCIUM: 9.1 mg/dL (ref 8.9–10.3)
CO2: 27 mmol/L (ref 22–32)
CREATININE: 1.16 mg/dL (ref 0.61–1.24)
Chloride: 100 mmol/L — ABNORMAL LOW (ref 101–111)
GFR calc non Af Amer: >60 mL/min (ref >60)
Glucose, Bld: 232 mg/dL — ABNORMAL HIGH (ref 65–99)
Potassium: 3.6 mmol/L (ref 3.5–5.1)
SODIUM: 137 mmol/L (ref 135–145)
TOTAL PROTEIN: 7.6 g/dL (ref 6.5–8.1)

## 2017-12-21 LAB — LACTATE DEHYDROGENASE: LDH: 132 U/L (ref 98–192)

## 2017-12-21 MED ORDER — RAMUCIRUMAB CHEMO INJECTION 500 MG/50ML
8.0000 mg/kg | Freq: Once | INTRAVENOUS | Status: AC
  Administered 2017-12-21: 800 mg via INTRAVENOUS
  Filled 2017-12-21: qty 50

## 2017-12-21 MED ORDER — SODIUM CHLORIDE 0.9 % IV SOLN
Freq: Once | INTRAVENOUS | Status: AC
  Administered 2017-12-21: 500 mL via INTRAVENOUS

## 2017-12-21 MED ORDER — DIPHENHYDRAMINE HCL 50 MG/ML IJ SOLN
INTRAMUSCULAR | Status: AC
  Filled 2017-12-21: qty 1

## 2017-12-21 MED ORDER — DIPHENHYDRAMINE HCL 50 MG/ML IJ SOLN
50.0000 mg | Freq: Once | INTRAMUSCULAR | Status: AC
  Administered 2017-12-21: 50 mg via INTRAVENOUS

## 2017-12-21 MED ORDER — HEPARIN SOD (PORK) LOCK FLUSH 100 UNIT/ML IV SOLN
500.0000 [IU] | Freq: Once | INTRAVENOUS | Status: AC | PRN
  Administered 2017-12-21: 500 [IU]

## 2017-12-21 MED ORDER — SODIUM CHLORIDE 0.9% FLUSH
10.0000 mL | INTRAVENOUS | Status: DC | PRN
  Administered 2017-12-21: 10 mL
  Filled 2017-12-21: qty 10

## 2017-12-21 NOTE — Progress Notes (Signed)
Per Dr Delton Coombes, check TSH every 3 months.

## 2017-12-21 NOTE — Progress Notes (Signed)
Nutrition Assessment   Reason for Assessment:   Patient identified on Malnutrition Screening report for weight loss and poor appetite  ASSESSMENT:  52 year old male with stage IV esophageal adenocarcinoma.  Past medical history of DM, HTN and Fe deficiency anemia.    Met with patient during infusion today.  Patient currently receiving cyramza. Noted patient in the hospital from 4/29 until 5/1 for pneumonia.  Patient reports that he did not eat during hospital admission, no appetite or taste for food.  Patient reports appetite is slowly coming back.  "I don't eat as much as I use too."  Reports usually does not eat breakfast but did this am (ham, eggs, grits ate 100% at [employer]).  Reports usually eats lunch, yesterday ate chinese food and last night for dinner wife fixed tacos.  Reports prior to hospital admission appetite was doing good.  Reports can tolerate all consistencies of foods (meats, breads, etc).    Reports he checks blood glucose occasionally.  Noted patient works as Librarian, academic at [employer].  Nutrition Focused Physical Exam: deferred  Medications: b complex vitamins, ALA, metformin, compazine  Labs: glucose 232  Anthropometrics:   Height: 73 inches Weight: 203 lb today UBW: 206 lb for the last 6-7 months per patient. Noted per chart 216 lb on 4/22, on 3/8 222 lb.  Reports prior to diagnosis  BMI: 27  6% weight loss in the last month, significant  Patient reports does not want to gain much weight back.     Estimated Energy Needs  Kcals: 2325- 2890 calories/d Protein: 116-140 g Fluid: 2.8 L/d  NUTRITION DIAGNOSIS: Inadequate oral intake related to pneumonia and recent hospital stay also likely esophageal cancer as well as evidenced by 6% weight loss in the last month and poor po intake   MALNUTRITION DIAGNOSIS: continue to monitor   INTERVENTION:  Discussed importance of good nutrition.   Encouraged eating good sources of protein at every  meal and provided examples of protein foods Discussed oral nutrition supplements as option to provided additional calories and protein. Encouraged patient to keep check on blood glucose. Contact information provided    MONITORING, EVALUATION, GOAL: weight trends, intake   NEXT VISIT: June 7 during infusion  Kairen Hallinan B. Zenia Resides, [place], [place] Registered Dietitian [phone] (pager)

## 2017-12-21 NOTE — Patient Instructions (Signed)
[facility] Discharge Instructions for Patients Receiving Chemotherapy  Today you received the following chemotherapy agents cyramza.    If you develop nausea and vomiting that is not controlled by your nausea medication, call the clinic.   BELOW ARE SYMPTOMS THAT SHOULD BE REPORTED IMMEDIATELY:  *FEVER GREATER THAN 100.5 F  *CHILLS WITH OR WITHOUT FEVER  NAUSEA AND VOMITING THAT IS NOT CONTROLLED WITH YOUR NAUSEA MEDICATION  *UNUSUAL SHORTNESS OF BREATH  *UNUSUAL BRUISING OR BLEEDING  TENDERNESS IN MOUTH AND THROAT WITH OR WITHOUT PRESENCE OF ULCERS  *URINARY PROBLEMS  *BOWEL PROBLEMS  UNUSUAL RASH Items with * indicate a potential emergency and should be followed up as soon as possible.  Feel free to call the clinic should you have any questions or concerns. The clinic phone number is [phone].  Please show the CHEMO ALERT CARD at check-in to the Emergency Department and triage nurse.   

## 2017-12-21 NOTE — Progress Notes (Signed)
Patient to treatment room for oncology follow up and chemotherapy.  Recent hospitalization for pneumonia last week but stated he feels better.  Denied fevers and chills.  Denied SOB.  Eating well with.  Stated toe neuropathy but no trips or falls.    Patient tolerated chemotherapy with no complaints voiced.  Port site clean and dry with no bruising or swelling noted at site.  Good blood return noted before and after administration of chemotherapy.  Band aid applied.  VSS with discharge and left ambulatory with no s/s of distress noted.

## 2017-12-21 NOTE — Assessment & Plan Note (Signed)
1.  Stage IV GE junction adenocarcinoma: - 6 cycles of FOLFOX from 02/15/2017 through 04/25/2017 -Paclitaxel and Cyramza from 05/18/2017, paclitaxel discontinued on 09/21/2017 secondary to neuropathy - [facility] 1 CDX showing MS-stable, TMB 3Muts/mb - CT scan on 11/28/2017 showing 4 mm right lower lobe nodule which is new, with stable disease elsewhere. - He was admitted to the hospital from 12/10/2017 through 12/12/2017 with pneumonia from parainfluenza 3.  He had mild hemoptysis at presentation with a fever of 103 and chills.  Now the cough has become dry.  He is feeling much better.  He may proceed with Cyramza today.  He will come back in 2 weeks for treatment.  I will see him back in 1 month for follow-up.  We will plan to repeat scans after 3 months from last scan.  2.  Peripheral neuropathy: He has bilateral toe numbness which hurt at times.  Taxol was discontinued on 09/21/2017.  He has tried gabapentin without help in the past.  He is taking vitamins for it.

## 2017-12-21 NOTE — Progress Notes (Signed)
Maxwell Henderson, [place] [address]   CLINIC:  Medical Oncology/Hematology  PCP:  Maxwell Henderson, [place] [address] [address] [phone]   REASON FOR VISIT:  Follow-up for GE junction adenocarcinoma.  CURRENT THERAPY: Cyramza every 2 weeks.  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 Barney Drain. Partially obstructing, likely malignant esophageal tumor was found in the distal esophagus. Biopsied with surgical path demonstrating Adenocarcinoma. Injected. Treated with argon plasma coagulation (APC) #3. 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 Biopsied with surgical path demonstrating Adenocarcinoma.      01/19/2017 PET scan    NECK Two FDG avid nodes are seen in the base of the neck on the right. The first is seen on CT image 35, just posterior to the right thyroid lobe and the other is seen on CT image 41 measuring 16 mm in short axis with a maximum SUV of 8.6.  CHEST  The patient's known malignancy involving the distal esophagus and cardia of the stomach is FDG avid with a maximum SUV of 10.4. No definitive metastatic nodes within the chest.  ABDOMEN/PELVIS  FDG avid metastatic adenopathy is seen in the gastrohepatic ligament, paraceliac nodes, posterior to the IVC on image 111, and to the left of the SMA on image 112. The most inferior node is seen in the aortocaval region on image 117. The representative node to the left of the SMA was measured on series 4, image 111 measuring 2.5 by 2.1 cm with a maximum SUV of 7. No other FDG avid disease in the abdomen or pelvis. Cholelithiasis is identified.      02/02/2017 Procedure    US guided bx of right supraclavicular LN by IR.      02/05/2017 Pathology Results    Lymph node, needle/core biopsy, Right  Supraclavicular - METASTATIC POORLY DIFFERENTIATED CARCINOMA.      02/10/2017 Pathology Results    HER2 - NEGATIVE      02/13/2017 Procedure    Port placed by IR      02/15/2017 - 04/25/2017 Chemotherapy    FOLFOX x 6 cycles       05/02/2017 Imaging    CT C/A/P: IMPRESSION: 1. Nodular thickening and hyperenhancement in the distal esophagus and gastric cardia could reflect residual tumor. 2. No significant change in size of previously demonstrated hypermetabolic upper abdominal lymph nodes. No progressive adenopathy identified. No residual enlarged supraclavicular nodes are seen. 3. No evidence of progressive metastatic disease. No worrisome hepatic findings. 4. Tiny right upper lobe pulmonary nodule, not clearly seen on low dose previous PET-CT. Attention on follow-up recommended. 5. Cholelithiasis.        CANCER STAGING: Cancer Staging Primary esophageal adenocarcinoma [facility]) Staging form: Esophagus - Adenocarcinoma, AJCC 8th Edition - Clinical stage from 02/11/2017: Stage IVA (cTX, cN3, cM0) - Signed by Baird Cancer, PA-C on 02/11/2017    INTERVAL HISTORY:  Maxwell Henderson 52 y.o. male returns for follow-up of GE junction adenocarcinoma.  He is receiving Cyramza every 2 weeks.  His Taxol was discontinued secondary to neuropathy in February.    He had recent hospitalization from 12/10/17-12/12/17 for pneumonia. He was noted to have worsening cough with fevers up to 103 with chills.  He tested positive for parainfluenza 3. Blood cultures and sputum cultures were negative. He  received 3 days of broad-spectrum antibiotics.  He was discharged on Levaquin.   Here today unaccompanied.  He finished his antibiotics yesterday.  Continues to have cough, but it is improved. Reports that he "is coughing up a little bit of blood." He feels like this is improving.   His appetite was low during hospitalization, but it is improving.  He is eating better now.  Denies any hematuria or blood in his  stools.  Overall, he feels like he is feeling much better.   Continues to have peripheral neuropathy to his feet; "it happens 24/7."  Endorses numbness, tingling, and burning.   He was taking gabapentin, but he did not feel like it helped.  He is taking the B complex vitamins and ALA supplement.    Overall, he feels well for treatment today.     REVIEW OF SYSTEMS:  Review of Systems  Constitutional: Positive for fatigue.  Respiratory: Positive for cough.   Neurological: Positive for numbness.  All other systems reviewed and are negative.    PAST MEDICAL/SURGICAL HISTORY:  Past Medical History:  Diagnosis Date  . Diabetes mellitus without complication ([place])   . Hypertension   . 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 ESOPHAGOGASTRODUODENOSCOPY (EGD) WITH PROPOFOL N/A 01/16/2017   Procedure: ESOPHAGOGASTRODUODENOSCOPY (EGD) WITH PROPOFOL;  Surgeon: [name], MD;  Location: [facility];  Service: Endoscopy;  Laterality: N/A;  730   . IR FLUORO GUIDE PORT INSERTION RIGHT  02/13/2017  . IR US GUIDE VASC ACCESS RIGHT  02/13/2017  . lipoma removal    . PORTA CATH INSERTION Right 02/13/2017  . SAVORY DILATION N/A 01/16/2017   Procedure: SAVORY DILATION;  Surgeon: [name], MD;  Location: [facility];  Service: Endoscopy;  Laterality: N/A;     SOCIAL HISTORY:  Social History   Socioeconomic History  . Marital status: Married    Spouse name: Not on file  . Number of children: Not on file  . Years of education: Not on file  . Highest education level: Not on file  Occupational History  . Not on file  Social Needs  . Financial resource strain: Not on file  . Food insecurity:    Worry: Not on file    Inability: Not on file  . Transportation needs:    Medical: Not on file    Non-medical: Not on file  Tobacco Use  . Smoking status: Former  Smoker    Packs/day: 0.25    Types: Cigarettes  . Smokeless tobacco: Never Used  Substance and Sexual Activity  . Alcohol use: No    Comment: hx heavy alcohol, hasn't drank in 19 yrs  . Drug use: Yes    Types: Marijuana    Comment: history of marijuana   . Sexual activity: Yes  Lifestyle  . Physical activity:    Days per week: Not on file    Minutes per session: Not on file  . Stress: Not on file  Relationships  . Social connections:    Talks on phone: Not on file    Gets together: Not on file    Attends religious service: Not on file    Active member of club or organization: Not on file    Attends meetings of clubs or organizations: Not on file    Relationship status: Not on file  . Intimate partner violence:  Fear of current or ex partner: Not on file    Emotionally abused: Not on file    Physically abused: Not on file    Forced sexual activity: Not on file  Other Topics Concern  . Not on file  Social History Narrative   WORKING [employer].    FAMILY HISTORY:  Family History  Problem Relation Age of Onset  . Prostate cancer Father   . Colon cancer Neg Hx   . Colon polyps Neg Hx     CURRENT MEDICATIONS:  Outpatient Encounter Medications as of 12/21/2017  Medication Sig  . acetaminophen (TYLENOL) 325 MG tablet Take 2 tablets (650 mg total) by mouth every 4 (four) hours as needed for headache.  . Alpha-Lipoic Acid 600 MG CAPS Take 1 capsule (600 mg total) by mouth daily.  . B Complex-C (B-COMPLEX WITH VITAMIN C) tablet Take 1 tablet by mouth daily.  Marland Kitchen gabapentin (NEURONTIN) 300 MG capsule Take 1 cap PO qHS for 7 days, then if tolerated increase to 1 cap PO twice a day. (Patient taking differently: Take 300 mg by mouth 2 (two) times daily. )  . hydrochlorothiazide (MICROZIDE) 12.5 MG capsule Take 1 capsule (12.5 mg total) by mouth daily.  Marland Kitchen lidocaine-prilocaine (EMLA) cream Apply 1 application topically as needed.  . loratadine (CLARITIN) 10 MG  tablet Take 10 mg by mouth daily.  Marland Kitchen losartan-hydrochlorothiazide (HYZAAR) 50-12.5 MG tablet Take 1 tablet by mouth daily.  . metFORMIN (GLUCOPHAGE) 500 MG tablet Take 500 mg by mouth daily.  . Misc. Devices MISC Please provide the patient with the Apothe[facility] HPB cough Syrup. 1 teaspoonful every 4-6 hours PRN. Please provide the patient with 360 ml.  . Morphine Sulfate (MORPHINE CONCENTRATE) 10 mg / 0.5 ml concentrated solution Take 0.5 mLs (10 mg total) by mouth every 4 (four) hours as needed for severe pain.  . pantoprazole (PROTONIX) 40 MG tablet Take 1 tablet (40 mg total) by mouth 2 (two) times daily. Take 30 minutes before breakfast  . prochlorperazine (COMPAZINE) 10 MG tablet TAKE 1 TABLET BY MOUTH EVERY 6 HOURS AS NEEDED FOR NAUSEA AND VOMITING  . Ramucirumab (CYRAMZA IV) Inject into the vein every 14 (fourteen) days. Fridays of every other week  . ranitidine (ZANTAC) 300 MG tablet Take 1 tablet (300 mg total) by mouth at bedtime.   No facility-administered encounter medications on file as of 12/21/2017.     ALLERGIES:  No Known Allergies   PHYSICAL EXAM:  ECOG Performance status: 0 I have reviewed his vitals.  Physical Exam   LABORATORY DATA:  I have reviewed the labs as listed.  CBC    Component Value Date/Time   WBC 8.4 12/21/2017 0816   RBC 4.89 12/21/2017 0816   HGB 12.6 (L) 12/21/2017 0816   HCT 38.0 (L) 12/21/2017 0816   PLT 242 12/21/2017 0816   MCV 77.7 (L) 12/21/2017 0816   MCH 25.8 (L) 12/21/2017 0816   MCHC 33.2 12/21/2017 0816   RDW 13.8 12/21/2017 0816   LYMPHSABS 2.1 12/21/2017 0816   MONOABS 0.5 12/21/2017 0816   EOSABS 0.1 12/21/2017 0816   BASOSABS 0.0 12/21/2017 0816   CMP Latest Ref Rng & Units 12/21/2017 12/12/2017 12/11/2017  Glucose 65 - 99 mg/dL 232(H) - 114(H)  BUN 6 - 20 mg/dL 15 - 9  Creatinine 0.61 - 1.24 mg/dL 1.16 0.90 1.07  Sodium 135 - 145 mmol/L 137 - 137  Potassium 3.5 - 5.1 mmol/L 3.6 - 3.6  Chloride 101 -  111 mmol/L 100(L) - 104    CO2 22 - 32 mmol/L 27 - 23  Calcium 8.9 - 10.3 mg/dL 9.1 - 8.0(L)  Total Protein 6.5 - 8.1 g/dL 7.6 - -  Total Bilirubin 0.3 - 1.2 mg/dL 0.7 - -  Alkaline Phos 38 - 126 U/L 46 - -  AST 15 - 41 U/L 27 - -  ALT 17 - 63 U/L 16(L) - -       DIAGNOSTIC IMAGING:  I have independently reviewed the images of the CT scan dated 11/28/2017 which showed a 4 mm right lower lobe lung nodule with stable disease elsewhere.     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  Stage IV GE junction adenocarcinoma: - 6 cycles of FOLFOX from 02/15/2017 through 04/25/2017 -Paclitaxel and Cyramza from 05/18/2017, paclitaxel discontinued on 09/21/2017 secondary to neuropathy - Foundation 1 CDX showing MS-stable, TMB 3Muts/mb - CT scan on 11/28/2017 showing 4 mm right lower lobe nodule which is new, with stable disease elsewhere. - He was admitted to the hospital from 12/10/2017 through 12/12/2017 with pneumonia from parainfluenza 3.  He had mild hemoptysis at presentation with a fever of 103 and chills.  Now the cough has become dry.  He is feeling much better.  He may proceed with Cyramza today.  He will come back in 2 weeks for treatment.  I will see him back in 1 month for follow-up.  We will plan to repeat scans after 3 months from last scan.  2.  Peripheral neuropathy: He has bilateral toe numbness which hurt at times.  Taxol was discontinued on 09/21/2017.  He has tried gabapentin without help in the past.  He is taking vitamins for it.     This note includes documentation from [name], NP, who was present during this patient's office visit and evaluation.  I have reviewed this note for its completeness and accuracy.  I have edited this note accordingly based on my findings and medical opinion.      Derek Jack, MD [place] [phone]

## 2017-12-31 ENCOUNTER — Ambulatory Visit (HOSPITAL_COMMUNITY): Payer: Managed Care, Other (non HMO) | Admitting: Hematology

## 2017-12-31 ENCOUNTER — Ambulatory Visit (HOSPITAL_COMMUNITY): Payer: Managed Care, Other (non HMO)

## 2017-12-31 ENCOUNTER — Other Ambulatory Visit (HOSPITAL_COMMUNITY): Payer: Managed Care, Other (non HMO)

## 2018-01-04 ENCOUNTER — Other Ambulatory Visit (HOSPITAL_COMMUNITY): Payer: Managed Care, Other (non HMO)

## 2018-01-04 ENCOUNTER — Inpatient Hospital Stay (HOSPITAL_COMMUNITY): Payer: Managed Care, Other (non HMO)

## 2018-01-04 ENCOUNTER — Encounter (HOSPITAL_COMMUNITY): Payer: Self-pay

## 2018-01-04 VITALS — BP 126/88 | HR 76 | Temp 97.6°F | Resp 18 | Wt 204.2 lb

## 2018-01-04 DIAGNOSIS — C159 Malignant neoplasm of esophagus, unspecified: Secondary | ICD-10-CM

## 2018-01-04 DIAGNOSIS — Z5112 Encounter for antineoplastic immunotherapy: Secondary | ICD-10-CM | POA: Diagnosis not present

## 2018-01-04 LAB — URINALYSIS, DIPSTICK ONLY
BILIRUBIN URINE: NEGATIVE
GLUCOSE, UA: NEGATIVE mg/dL
HGB URINE DIPSTICK: NEGATIVE
Ketones, ur: NEGATIVE mg/dL
Leukocytes, UA: NEGATIVE
Nitrite: NEGATIVE
PH: 5 (ref 5.0–8.0)
Protein, ur: 30 mg/dL — AB
Specific Gravity, Urine: 1.029 (ref 1.005–1.030)

## 2018-01-04 LAB — CBC WITH DIFFERENTIAL/PLATELET
Basophils Absolute: 0 10*3/uL (ref 0.0–0.1)
Basophils Relative: 1 %
EOS ABS: 0.1 10*3/uL (ref 0.0–0.7)
Eosinophils Relative: 1 %
HEMATOCRIT: 37.2 % — AB (ref 39.0–52.0)
HEMOGLOBIN: 12.4 g/dL — AB (ref 13.0–17.0)
LYMPHS ABS: 1.8 10*3/uL (ref 0.7–4.0)
Lymphocytes Relative: 45 %
MCH: 25.9 pg — ABNORMAL LOW (ref 26.0–34.0)
MCHC: 33.3 g/dL (ref 30.0–36.0)
MCV: 77.8 fL — ABNORMAL LOW (ref 78.0–100.0)
MONO ABS: 0.4 10*3/uL (ref 0.1–1.0)
MONOS PCT: 10 %
NEUTROS PCT: 43 %
Neutro Abs: 1.8 10*3/uL (ref 1.7–7.7)
Platelets: 206 10*3/uL (ref 150–400)
RBC: 4.78 MIL/uL (ref 4.22–5.81)
RDW: 14.1 % (ref 11.5–15.5)
WBC: 4.1 10*3/uL (ref 4.0–10.5)

## 2018-01-04 LAB — COMPREHENSIVE METABOLIC PANEL
ALK PHOS: 38 U/L (ref 38–126)
ALT: 18 U/L (ref 17–63)
AST: 23 U/L (ref 15–41)
Albumin: 4 g/dL (ref 3.5–5.0)
Anion gap: 7 (ref 5–15)
BILIRUBIN TOTAL: 0.8 mg/dL (ref 0.3–1.2)
BUN: 17 mg/dL (ref 6–20)
CALCIUM: 9.1 mg/dL (ref 8.9–10.3)
CHLORIDE: 103 mmol/L (ref 101–111)
CO2: 28 mmol/L (ref 22–32)
CREATININE: 1.03 mg/dL (ref 0.61–1.24)
GLUCOSE: 105 mg/dL — AB (ref 65–99)
Potassium: 3.5 mmol/L (ref 3.5–5.1)
Sodium: 138 mmol/L (ref 135–145)
Total Protein: 7.4 g/dL (ref 6.5–8.1)

## 2018-01-04 MED ORDER — SODIUM CHLORIDE 0.9 % IV SOLN
Freq: Once | INTRAVENOUS | Status: AC
  Administered 2018-01-04: 10:00:00 via INTRAVENOUS

## 2018-01-04 MED ORDER — DIPHENHYDRAMINE HCL 50 MG/ML IJ SOLN
50.0000 mg | Freq: Once | INTRAMUSCULAR | Status: AC
  Administered 2018-01-04: 50 mg via INTRAVENOUS
  Filled 2018-01-04: qty 1

## 2018-01-04 MED ORDER — SODIUM CHLORIDE 0.9% FLUSH
10.0000 mL | INTRAVENOUS | Status: DC | PRN
  Administered 2018-01-04: 10 mL
  Filled 2018-01-04: qty 10

## 2018-01-04 MED ORDER — HEPARIN SOD (PORK) LOCK FLUSH 100 UNIT/ML IV SOLN
500.0000 [IU] | Freq: Once | INTRAVENOUS | Status: AC | PRN
  Administered 2018-01-04: 500 [IU]
  Filled 2018-01-04: qty 5

## 2018-01-04 MED ORDER — SODIUM CHLORIDE 0.9 % IV SOLN
8.0000 mg/kg | Freq: Once | INTRAVENOUS | Status: AC
  Administered 2018-01-04: 800 mg via INTRAVENOUS
  Filled 2018-01-04: qty 30

## 2018-01-04 NOTE — Patient Instructions (Signed)
[facility] Discharge Instructions for Patients Receiving Chemotherapy   Beginning January 23rd 2017 lab work for the Cancer Center will be done in the  Main lab at [facility] on 1st floor. If you have a lab appointment with the Cancer Center please come in thru the  Main Entrance and check in at the main information desk   Today you received the following chemotherapy agents Cyramza. Follow-up as scheduled. Call clinic for any questions or concerns  To help prevent nausea and vomiting after your treatment, we encourage you to take your nausea medication   If you develop nausea and vomiting, or diarrhea that is not controlled by your medication, call the clinic.  The clinic phone number is [phone]. Office hours are Monday-Friday 8:30am-5:00pm.  BELOW ARE SYMPTOMS THAT SHOULD BE REPORTED IMMEDIATELY:  *FEVER GREATER THAN 101.0 F  *CHILLS WITH OR WITHOUT FEVER  NAUSEA AND VOMITING THAT IS NOT CONTROLLED WITH YOUR NAUSEA MEDICATION  *UNUSUAL SHORTNESS OF BREATH  *UNUSUAL BRUISING OR BLEEDING  TENDERNESS IN MOUTH AND THROAT WITH OR WITHOUT PRESENCE OF ULCERS  *URINARY PROBLEMS  *BOWEL PROBLEMS  UNUSUAL RASH Items with * indicate a potential emergency and should be followed up as soon as possible. If you have an emergency after office hours please contact your primary care physician or go to the nearest emergency department.  Please call the clinic during office hours if you have any questions or concerns.   You may also contact the Patient Navigator at [phone] should you have any questions or need assistance in obtaining follow up care.      Resources For Cancer Patients and their Caregivers ? American Cancer Society: Can assist with transportation, wigs, general needs, runs Look Good Feel Better.        1-888-227-6333 ? Cancer Care: Provides financial assistance, online support groups, medication/co-pay assistance.  1-800-813-HOPE  (4673) ? [facility] Assists [place] cancer patients and their families through emotional , educational and financial support.  336-427-4357 ? [place] DSS Where to apply for food stamps, Medicaid and utility assistance. 336-342-1394 ? [facility]: Transportation to medical appointments. 336-347-2287 ? Social Security Administration: May apply for disability if have a Stage IV cancer. 336-342-7796 1-800-772-1213 ? [place] Aging, Disability and Transit Services: Assists with nutrition, care and transit needs. 336-349-2343         

## 2018-01-04 NOTE — Progress Notes (Signed)
Maxwell Henderson tolerated Cyramza infusion well without complaints or incident.Labs reviewed with Dr. Delton Coombes prior to administering this medication VSS upon discharge. Pt discharged self ambulatory in satisfactory condition accompanied by family members

## 2018-01-11 ENCOUNTER — Ambulatory Visit (HOSPITAL_COMMUNITY): Payer: Managed Care, Other (non HMO) | Admitting: Hematology

## 2018-01-11 ENCOUNTER — Other Ambulatory Visit (HOSPITAL_COMMUNITY): Payer: Managed Care, Other (non HMO)

## 2018-01-11 ENCOUNTER — Ambulatory Visit (HOSPITAL_COMMUNITY): Payer: Managed Care, Other (non HMO)

## 2018-01-14 ENCOUNTER — Ambulatory Visit (HOSPITAL_COMMUNITY): Payer: Managed Care, Other (non HMO)

## 2018-01-14 ENCOUNTER — Other Ambulatory Visit (HOSPITAL_COMMUNITY): Payer: Managed Care, Other (non HMO)

## 2018-01-17 ENCOUNTER — Other Ambulatory Visit (HOSPITAL_COMMUNITY): Payer: Self-pay

## 2018-01-17 DIAGNOSIS — C159 Malignant neoplasm of esophagus, unspecified: Secondary | ICD-10-CM

## 2018-01-17 DIAGNOSIS — Z5111 Encounter for antineoplastic chemotherapy: Secondary | ICD-10-CM

## 2018-01-17 DIAGNOSIS — C158 Malignant neoplasm of overlapping sites of esophagus: Secondary | ICD-10-CM

## 2018-01-18 ENCOUNTER — Encounter (HOSPITAL_COMMUNITY): Payer: Self-pay | Admitting: Hematology

## 2018-01-18 ENCOUNTER — Inpatient Hospital Stay (HOSPITAL_COMMUNITY): Payer: Managed Care, Other (non HMO) | Attending: Hematology

## 2018-01-18 ENCOUNTER — Ambulatory Visit (HOSPITAL_COMMUNITY): Payer: Managed Care, Other (non HMO) | Admitting: Hematology

## 2018-01-18 ENCOUNTER — Encounter (HOSPITAL_COMMUNITY): Payer: Managed Care, Other (non HMO)

## 2018-01-18 ENCOUNTER — Inpatient Hospital Stay (HOSPITAL_COMMUNITY): Payer: Managed Care, Other (non HMO)

## 2018-01-18 ENCOUNTER — Inpatient Hospital Stay (HOSPITAL_BASED_OUTPATIENT_CLINIC_OR_DEPARTMENT_OTHER): Payer: Managed Care, Other (non HMO) | Admitting: Hematology

## 2018-01-18 ENCOUNTER — Ambulatory Visit (HOSPITAL_COMMUNITY): Payer: Managed Care, Other (non HMO)

## 2018-01-18 ENCOUNTER — Encounter (HOSPITAL_COMMUNITY): Payer: Self-pay

## 2018-01-18 ENCOUNTER — Other Ambulatory Visit (HOSPITAL_COMMUNITY): Payer: Managed Care, Other (non HMO)

## 2018-01-18 VITALS — BP 124/85 | HR 67 | Temp 97.8°F | Resp 18 | Wt 207.2 lb

## 2018-01-18 DIAGNOSIS — G620 Drug-induced polyneuropathy: Secondary | ICD-10-CM | POA: Insufficient documentation

## 2018-01-18 DIAGNOSIS — C159 Malignant neoplasm of esophagus, unspecified: Secondary | ICD-10-CM

## 2018-01-18 DIAGNOSIS — C770 Secondary and unspecified malignant neoplasm of lymph nodes of head, face and neck: Secondary | ICD-10-CM | POA: Diagnosis not present

## 2018-01-18 DIAGNOSIS — C155 Malignant neoplasm of lower third of esophagus: Secondary | ICD-10-CM | POA: Diagnosis not present

## 2018-01-18 DIAGNOSIS — Z87891 Personal history of nicotine dependence: Secondary | ICD-10-CM | POA: Insufficient documentation

## 2018-01-18 DIAGNOSIS — Z5112 Encounter for antineoplastic immunotherapy: Secondary | ICD-10-CM | POA: Insufficient documentation

## 2018-01-18 DIAGNOSIS — C158 Malignant neoplasm of overlapping sites of esophagus: Secondary | ICD-10-CM

## 2018-01-18 DIAGNOSIS — Z5111 Encounter for antineoplastic chemotherapy: Secondary | ICD-10-CM

## 2018-01-18 DIAGNOSIS — G6289 Other specified polyneuropathies: Secondary | ICD-10-CM

## 2018-01-18 LAB — CBC WITH DIFFERENTIAL/PLATELET
Basophils Absolute: 0 10*3/uL (ref 0.0–0.1)
Basophils Relative: 1 %
EOS ABS: 0.1 10*3/uL (ref 0.0–0.7)
Eosinophils Relative: 3 %
HCT: 39.5 % (ref 39.0–52.0)
Hemoglobin: 13.2 g/dL (ref 13.0–17.0)
LYMPHS ABS: 2 10*3/uL (ref 0.7–4.0)
LYMPHS PCT: 48 %
MCH: 26 pg (ref 26.0–34.0)
MCHC: 33.4 g/dL (ref 30.0–36.0)
MCV: 77.8 fL — ABNORMAL LOW (ref 78.0–100.0)
MONOS PCT: 6 %
Monocytes Absolute: 0.3 10*3/uL (ref 0.1–1.0)
Neutro Abs: 1.8 10*3/uL (ref 1.7–7.7)
Neutrophils Relative %: 42 %
PLATELETS: 175 10*3/uL (ref 150–400)
RBC: 5.08 MIL/uL (ref 4.22–5.81)
RDW: 13.9 % (ref 11.5–15.5)
WBC: 4.2 10*3/uL (ref 4.0–10.5)

## 2018-01-18 LAB — COMPREHENSIVE METABOLIC PANEL
ALK PHOS: 44 U/L (ref 38–126)
ALT: 20 U/L (ref 17–63)
ANION GAP: 8 (ref 5–15)
AST: 27 U/L (ref 15–41)
Albumin: 4.4 g/dL (ref 3.5–5.0)
BUN: 17 mg/dL (ref 6–20)
CALCIUM: 9.7 mg/dL (ref 8.9–10.3)
CHLORIDE: 101 mmol/L (ref 101–111)
CO2: 30 mmol/L (ref 22–32)
CREATININE: 1.1 mg/dL (ref 0.61–1.24)
Glucose, Bld: 103 mg/dL — ABNORMAL HIGH (ref 65–99)
Potassium: 3.6 mmol/L (ref 3.5–5.1)
SODIUM: 139 mmol/L (ref 135–145)
Total Bilirubin: 0.9 mg/dL (ref 0.3–1.2)
Total Protein: 7.8 g/dL (ref 6.5–8.1)

## 2018-01-18 LAB — URINALYSIS, DIPSTICK ONLY
Bilirubin Urine: NEGATIVE
Glucose, UA: NEGATIVE mg/dL
Hgb urine dipstick: NEGATIVE
Ketones, ur: 20 mg/dL — AB
Leukocytes, UA: NEGATIVE
NITRITE: NEGATIVE
PH: 5 (ref 5.0–8.0)
Protein, ur: NEGATIVE mg/dL
SPECIFIC GRAVITY, URINE: 1.032 — AB (ref 1.005–1.030)

## 2018-01-18 MED ORDER — SODIUM CHLORIDE 0.9 % IV SOLN
Freq: Once | INTRAVENOUS | Status: AC
  Administered 2018-01-18: 09:00:00 via INTRAVENOUS

## 2018-01-18 MED ORDER — HEPARIN SOD (PORK) LOCK FLUSH 100 UNIT/ML IV SOLN
500.0000 [IU] | Freq: Once | INTRAVENOUS | Status: AC | PRN
  Administered 2018-01-18: 500 [IU]
  Filled 2018-01-18: qty 5

## 2018-01-18 MED ORDER — DIPHENHYDRAMINE HCL 50 MG/ML IJ SOLN
50.0000 mg | Freq: Once | INTRAMUSCULAR | Status: AC
  Administered 2018-01-18: 50 mg via INTRAVENOUS
  Filled 2018-01-18: qty 1

## 2018-01-18 MED ORDER — SODIUM CHLORIDE 0.9 % IV SOLN
8.0000 mg/kg | Freq: Once | INTRAVENOUS | Status: AC
  Administered 2018-01-18: 800 mg via INTRAVENOUS
  Filled 2018-01-18: qty 30

## 2018-01-18 MED ORDER — GABAPENTIN 300 MG PO CAPS: 300.0000 mg | ORAL_CAPSULE | Freq: Two times a day (BID) | ORAL | 2 refills | Status: DC

## 2018-01-18 MED ORDER — SODIUM CHLORIDE 0.9% FLUSH
10.0000 mL | INTRAVENOUS | Status: DC | PRN
  Administered 2018-01-18: 10 mL
  Filled 2018-01-18: qty 10

## 2018-01-18 MED ORDER — LIDOCAINE-PRILOCAINE 2.5-2.5 % EX CREA: 1.0000 "application " | TOPICAL_CREAM | CUTANEOUS | 0 refills | Status: DC | PRN

## 2018-01-18 NOTE — Progress Notes (Signed)
Nutrition Follow-up:  Patient with stage IV esophageal adenocarcinoma.  Patient receiving cyramza.   Met with patient during infusion this am for nutrition follow-up.  Patient reports appetite is better after getting over pneumonia.  Reports wife helps him with meals.  Reports that he has not been drinking oral nutrition shakes but eating regular foods, anything that he wants.    No nutrition impact symptoms reported.    Medications: reviewed  Labs: reviewed  Anthropometrics:   Weight increased to 207 lb today from 203 lb on 5/10.     NUTRITION DIAGNOSIS: Inadequate oral intake improved   MALNUTRITION DIAGNOSIS: continue to monitor   INTERVENTION:   Encouraged patient to continue to eat well balanced diet.  Encouraged good sources of protein at every meal.  Reviewed sources of protein with patient.   Agree with holding off on oral nutrition supplement shakes at this time as weight is increasing.     MONITORING, EVALUATION, GOAL: weight trends, intake   NEXT VISIT: July 19 during infusion  Maxwell Henderson B. Zenia Resides, [place], [place] Registered Dietitian [phone] (pager)

## 2018-01-18 NOTE — Assessment & Plan Note (Signed)
1.  Stage IV GE junction adenocarcinoma: - 6 cycles of FOLFOX from 02/15/2017 through 04/25/2017 -Paclitaxel and Cyramza from 05/18/2017, paclitaxel discontinued on 09/21/2017 secondary to neuropathy - Foundation 1 CDX showing MS-stable, TMB 3Muts/mb - CT scan on 11/28/2017 showing 4 mm right lower lobe nodule which is new, with stable disease elsewhere. - He was admitted to the hospital from 12/10/2017 through 12/12/2017 with pneumonia from parainfluenza 3. - He has been feeling very well in the past 5 weeks.  Tolerating Cyramza very well.  Denies any bleeding issues.  Blood pressure has been stable.  He may proceed with Cyramza every 2 weeks.  I will schedule him for CT scans in 6 weeks and see him back after that.   2.  Peripheral neuropathy: He has bilateral toe numbness which hurt at times. Taxol was discontinued on 09/21/2017.  He has not truly taken gabapentin continuously in the past.  Hence we will reintroduce gabapentin 300 mg twice daily.  We will plan to increase it to 3 times a day if he tolerates well and it helps.  I have sent a prescription for it.

## 2018-01-18 NOTE — Progress Notes (Signed)
[place], [place] [address]   CLINIC:  Medical Oncology/Hematology  PCP:  [name], [place] [address] [id number] [phone]   REASON FOR VISIT:  Follow-up for GE junction adenocarcinoma.  CURRENT THERAPY: Cyramza every 2 weeks.  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 Barney Drain. Partially obstructing, likely malignant esophageal tumor was found in the distal esophagus. Biopsied with surgical path demonstrating Adenocarcinoma. Injected. Treated with argon plasma coagulation (APC) #3. 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 Biopsied with surgical path demonstrating Adenocarcinoma.      01/19/2017 PET scan    NECK Two FDG avid nodes are seen in the base of the neck on the right. The first is seen on CT image 35, just posterior to the right thyroid lobe and the other is seen on CT image 41 measuring 16 mm in short axis with a maximum SUV of 8.6.  CHEST  The patient's known malignancy involving the distal esophagus and cardia of the stomach is FDG avid with a maximum SUV of 10.4. No definitive metastatic nodes within the chest.  ABDOMEN/PELVIS  FDG avid metastatic adenopathy is seen in the gastrohepatic ligament, paraceliac nodes, posterior to the IVC on image 111, and to the left of the SMA on image 112. The most inferior node is seen in the aortocaval region on image 117. The representative node to the left of the SMA was measured on series 4, image 111 measuring 2.5 by 2.1 cm with a maximum SUV of 7. No other FDG avid disease in the abdomen or pelvis. Cholelithiasis is identified.      02/02/2017 Procedure    US guided bx of right supraclavicular LN by IR.      02/05/2017 Pathology Results    Lymph node, needle/core biopsy, Right  Supraclavicular - METASTATIC POORLY DIFFERENTIATED CARCINOMA.      02/10/2017 Pathology Results    HER2 - NEGATIVE      02/13/2017 Procedure    Port placed by IR      02/15/2017 - 04/25/2017 Chemotherapy    FOLFOX x 6 cycles       05/02/2017 Imaging    CT C/A/P: IMPRESSION: 1. Nodular thickening and hyperenhancement in the distal esophagus and gastric cardia could reflect residual tumor. 2. No significant change in size of previously demonstrated hypermetabolic upper abdominal lymph nodes. No progressive adenopathy identified. No residual enlarged supraclavicular nodes are seen. 3. No evidence of progressive metastatic disease. No worrisome hepatic findings. 4. Tiny right upper lobe pulmonary nodule, not clearly seen on low dose previous PET-CT. Attention on follow-up recommended. 5. Cholelithiasis.        CANCER STAGING: Cancer Staging Primary esophageal adenocarcinoma [facility]) Staging form: Esophagus - Adenocarcinoma, AJCC 8th Edition - Clinical stage from 02/11/2017: Stage IVA (cTX, cN3, cM0) - Signed by Baird Cancer, PA-C on 02/11/2017    INTERVAL HISTORY:  Maxwell Henderson 52 y.o. male returns for follow-up of his GE junction adenocarcinoma and his next cycle of Cyramza.  He denies any dysphagia or odynophagia.  He is able to eat well and is not losing weight.  He denies any bleeding issues.  He has neuropathy with numbness and occasional burning and stinging pain in bilateral toes.  His energy levels are 75%.  Appetite is 100%.  Denies any nausea, vomiting, diarrhea or constipation.  No fevers or infections.  His last hospitalization was end of April with pneumonia.  REVIEW OF SYSTEMS:  Review of Systems  Constitutional: Positive for fatigue.  Neurological: Positive for numbness.  All other systems reviewed and are negative.    PAST MEDICAL/SURGICAL HISTORY:  Past Medical History:  Diagnosis Date  . Diabetes mellitus without complication ([place])   . Hypertension   .  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 ESOPHAGOGASTRODUODENOSCOPY (EGD) WITH PROPOFOL N/A 01/16/2017   Procedure: ESOPHAGOGASTRODUODENOSCOPY (EGD) WITH PROPOFOL;  Surgeon: [name], MD;  Location: [facility];  Service: Endoscopy;  Laterality: N/A;  730   . IR FLUORO GUIDE PORT INSERTION RIGHT  02/13/2017  . IR US GUIDE VASC ACCESS RIGHT  02/13/2017  . lipoma removal    . PORTA CATH INSERTION Right 02/13/2017  . SAVORY DILATION N/A 01/16/2017   Procedure: SAVORY DILATION;  Surgeon: [name], MD;  Location: [facility];  Service: Endoscopy;  Laterality: N/A;     SOCIAL HISTORY:  Social History   Socioeconomic History  . Marital status: Married    Spouse name: Not on file  . Number of children: Not on file  . Years of education: Not on file  . Highest education level: Not on file  Occupational History  . Not on file  Social Needs  . Financial resource strain: Not on file  . Food insecurity:    Worry: Not on file    Inability: Not on file  . Transportation needs:    Medical: Not on file    Non-medical: Not on file  Tobacco Use  . Smoking status: Former Smoker    Packs/day: 0.25    Types: Cigarettes  . Smokeless tobacco: Never Used  Substance and Sexual Activity  . Alcohol use: No    Comment: hx heavy alcohol, hasn't drank in 19 yrs  . Drug use: Yes    Types: Marijuana    Comment: history of marijuana   . Sexual activity: Yes  Lifestyle  . Physical activity:    Days per week: Not on file    Minutes per session: Not on file  . Stress: Not on file  Relationships  . Social connections:    Talks on phone: Not on file    Gets together: Not on file    Attends religious service: Not on file    Active member of club or organization: Not on file    Attends meetings of clubs or organizations: Not on file    Relationship status: Not on  file  . Intimate partner violence:    Fear of current or ex partner: Not on file    Emotionally abused: Not on file    Physically abused: Not on file    Forced sexual activity: Not on file  Other Topics Concern  . Not on file  Social History Narrative   WORKING [employer] AND [employer].    FAMILY HISTORY:  Family History  Problem Relation Age of Onset  . Prostate cancer Father   . Colon cancer Neg Hx   . Colon polyps Neg Hx     CURRENT MEDICATIONS:  Outpatient Encounter Medications as of 01/18/2018  Medication Sig  . acetaminophen (TYLENOL) 325 MG tablet Take 2 tablets (650 mg total) by mouth every 4 (four) hours as needed for  headache.  . Alpha-Lipoic Acid 600 MG CAPS Take 1 capsule (600 mg total) by mouth daily.  . B Complex-C (B-COMPLEX WITH VITAMIN C) tablet Take 1 tablet by mouth daily.  [place] Kitchen gabapentin (NEURONTIN) 300 MG capsule Take 1 capsule (300 mg total) by mouth 2 (two) times daily.  . hydrochlorothiazide (MICROZIDE) 12.5 MG capsule Take 1 capsule (12.5 mg total) by mouth daily.  [place] Kitchen loratadine (CLARITIN) 10 MG tablet Take 10 mg by mouth daily.  [place] Kitchen losartan-hydrochlorothiazide (HYZAAR) 50-12.5 MG tablet Take 1 tablet by mouth daily.  . metFORMIN (GLUCOPHAGE) 500 MG tablet Take 500 mg by mouth daily.  . Misc. Devices MISC Please provide the patient with the Apothec[facility] HPB cough Syrup. 1 teaspoonful every 4-6 hours PRN. Please provide the patient with 360 ml.  . Morphine Sulfate (MORPHINE CONCENTRATE) 10 mg / 0.5 ml concentrated solution Take 0.5 mLs (10 mg total) by mouth every 4 (four) hours as needed for severe pain.  . pantoprazole (PROTONIX) 40 MG tablet Take 1 tablet (40 mg total) by mouth 2 (two) times daily. Take 30 minutes before breakfast  . prochlorperazine (COMPAZINE) 10 MG tablet TAKE 1 TABLET BY MOUTH EVERY 6 HOURS AS NEEDED FOR NAUSEA AND VOMITING  . Ramucirumab (CYRAMZA IV) Inject into the vein every 14 (fourteen) days. Fridays of every other week  .  ranitidine (ZANTAC) 300 MG tablet Take 1 tablet (300 mg total) by mouth at bedtime.  . [DISCONTINUED] gabapentin (NEURONTIN) 300 MG capsule Take 1 cap PO qHS for 7 days, then if tolerated increase to 1 cap PO twice a day. (Patient taking differently: Take 300 mg by mouth 2 (two) times daily. )  . [DISCONTINUED] lidocaine-prilocaine (EMLA) cream Apply 1 application topically as needed.   No facility-administered encounter medications on file as of 01/18/2018.     ALLERGIES:  No Known Allergies   PHYSICAL EXAM:  ECOG Performance status: 0  I have reviewed his vitals.  Blood pressure is 114/79.  Pulse is 74.  Respiratory 16.  Temperature 98.5. Physical Exam HEENT: No thrush or mucositis. Chest: Bilateral clear to auscultation. Cardiovascular: S1-S2 regular rate and rhythm. Abdomen: Soft nontender with no palpable masses. Extremities: No edema cyanosis. Skin: No rashes or ulcers.  LABORATORY DATA:  I have reviewed the labs as listed.  CBC    Component Value Date/Time   WBC 4.2 01/18/2018 0757   RBC 5.08 01/18/2018 0757   HGB 13.2 01/18/2018 0757   HCT 39.5 01/18/2018 0757   PLT 175 01/18/2018 0757   MCV 77.8 (L) 01/18/2018 0757   MCH 26.0 01/18/2018 0757   MCHC 33.4 01/18/2018 0757   RDW 13.9 01/18/2018 0757   LYMPHSABS 2.0 01/18/2018 0757   MONOABS 0.3 01/18/2018 0757   EOSABS 0.1 01/18/2018 0757   BASOSABS 0.0 01/18/2018 0757   CMP Latest Ref Rng & Units 01/18/2018 01/04/2018 12/21/2017  Glucose 65 - 99 mg/dL 103(H) 105(H) 232(H)  BUN 6 - 20 mg/dL '17 17 15  ' Creatinine 0.61 - 1.24 mg/dL 1.10 1.03 1.16  Sodium 135 - 145 mmol/L 139 138 137  Potassium 3.5 - 5.1 mmol/L 3.6 3.5 3.6  Chloride 101 - 111 mmol/L 101 103 100(L)  CO2 22 - 32 mmol/L '30 28 27  ' Calcium 8.9 - 10.3 mg/dL 9.7 9.1 9.1  Total Protein 6.5 - 8.1 g/dL 7.8 7.4 7.6  Total Bilirubin 0.3 - 1.2 mg/dL 0.9 0.8 0.7  Alkaline Phos 38 - 126 U/L 44 38 46  AST 15 - 41 U/L 27 23  27  ALT 17 - 63 U/L 20 18 16(L)         ASSESSMENT & PLAN:   Primary esophageal adenocarcinoma ([facility]) 1.  Stage IV GE junction adenocarcinoma: - 6 cycles of FOLFOX from 02/15/2017 through 04/25/2017 -Paclitaxel and Cyramza from 05/18/2017, paclitaxel discontinued on 09/21/2017 secondary to neuropathy - Foundation 1 CDX showing MS-stable, TMB 3Muts/mb - CT scan on 11/28/2017 showing 4 mm right lower lobe nodule which is new, with stable disease elsewhere. - He was admitted to the hospital from 12/10/2017 through 12/12/2017 with pneumonia from parainfluenza 3. - He has been feeling very well in the past 5 weeks.  Tolerating Cyramza very well.  Denies any bleeding issues.  Blood pressure has been stable.  He may proceed with Cyramza every 2 weeks.  I will schedule him for CT scans in 6 weeks and see him back after that.   2.  Peripheral neuropathy: He has bilateral toe numbness which hurt at times. Taxol was discontinued on 09/21/2017.  He has not truly taken gabapentin continuously in the past.  Hence we will reintroduce gabapentin 300 mg twice daily.  We will plan to increase it to 3 times a day if he tolerates well and it helps.  I have sent a prescription for it.      Orders placed this encounter:  Orders Placed This Encounter  Procedures  . CT Chest W Contrast  . CT Abdomen Pelvis W Contrast  . CBC with Differential  . Comprehensive metabolic panel      Derek Jack, MD [place] [phone]

## 2018-01-18 NOTE — Patient Instructions (Signed)
[facility] Discharge Instructions for Patients Receiving Chemotherapy   Beginning January 23rd 2017 lab work for the Cancer Center will be done in the  Main lab at [place] on 1st floor. If you have a lab appointment with the Cancer Center please come in thru the  Main Entrance and check in at the main information desk   Today you received the following chemotherapy agents Cyramza. Follow-up as scheduled. Call clinic for any questions or concerns  To help prevent nausea and vomiting after your treatment, we encourage you to take your nausea medication   If you develop nausea and vomiting, or diarrhea that is not controlled by your medication, call the clinic.  The clinic phone number is [phone]. Office hours are Monday-Friday 8:30am-5:00pm.  BELOW ARE SYMPTOMS THAT SHOULD BE REPORTED IMMEDIATELY:  *FEVER GREATER THAN 101.0 F  *CHILLS WITH OR WITHOUT FEVER  NAUSEA AND VOMITING THAT IS NOT CONTROLLED WITH YOUR NAUSEA MEDICATION  *UNUSUAL SHORTNESS OF BREATH  *UNUSUAL BRUISING OR BLEEDING  TENDERNESS IN MOUTH AND THROAT WITH OR WITHOUT PRESENCE OF ULCERS  *URINARY PROBLEMS  *BOWEL PROBLEMS  UNUSUAL RASH Items with * indicate a potential emergency and should be followed up as soon as possible. If you have an emergency after office hours please contact your primary care physician or go to the nearest emergency department.  Please call the clinic during office hours if you have any questions or concerns.   You may also contact the Patient Navigator at [phone] should you have any questions or need assistance in obtaining follow up care.      Resources For Cancer Patients and their Caregivers ? American Cancer Society: Can assist with transportation, wigs, general needs, runs Look Good Feel Better.        1-888-227-6333 ? Cancer Care: Provides financial assistance, online support groups, medication/co-pay assistance.  1-800-813-HOPE  (4673) ? [facility] Assists [place] cancer patients and their families through emotional , educational and financial support.  336-427-4357 ? [place] DSS Where to apply for food stamps, Medicaid and utility assistance. 336-342-1394 ? [facility]: Transportation to medical appointments. 336-347-2287 ? Social Security Administration: May apply for disability if have a Stage IV cancer. 336-342-7796 1-800-772-1213 ? [place] Aging, Disability and Transit Services: Assists with nutrition, care and transit needs. 336-349-2343         

## 2018-01-18 NOTE — Progress Notes (Signed)
0845 Lab and urine results reviewed with and pt seen by Dr. Delton Coombes and pt approved for Cyramza infusion today per MD                                                     Maxwell Henderson tolerated Cyramza infusion well without complaints or incident. VSS upon discharge. Pt discharged self ambulatory in satisfactory condition

## 2018-01-21 ENCOUNTER — Other Ambulatory Visit (HOSPITAL_COMMUNITY): Payer: Self-pay | Admitting: Adult Health

## 2018-01-21 DIAGNOSIS — I158 Other secondary hypertension: Secondary | ICD-10-CM

## 2018-01-21 DIAGNOSIS — C159 Malignant neoplasm of esophagus, unspecified: Secondary | ICD-10-CM

## 2018-02-01 ENCOUNTER — Other Ambulatory Visit: Payer: Self-pay

## 2018-02-01 ENCOUNTER — Inpatient Hospital Stay (HOSPITAL_COMMUNITY): Payer: Managed Care, Other (non HMO)

## 2018-02-01 ENCOUNTER — Encounter (HOSPITAL_COMMUNITY): Payer: Self-pay

## 2018-02-01 VITALS — BP 133/87 | HR 70 | Temp 97.7°F | Resp 18 | Wt 211.9 lb

## 2018-02-01 DIAGNOSIS — Z5111 Encounter for antineoplastic chemotherapy: Secondary | ICD-10-CM

## 2018-02-01 DIAGNOSIS — Z5112 Encounter for antineoplastic immunotherapy: Secondary | ICD-10-CM | POA: Diagnosis not present

## 2018-02-01 DIAGNOSIS — C159 Malignant neoplasm of esophagus, unspecified: Secondary | ICD-10-CM

## 2018-02-01 DIAGNOSIS — C158 Malignant neoplasm of overlapping sites of esophagus: Secondary | ICD-10-CM

## 2018-02-01 LAB — COMPREHENSIVE METABOLIC PANEL
ALBUMIN: 4.4 g/dL (ref 3.5–5.0)
ALK PHOS: 45 U/L (ref 38–126)
ALT: 26 U/L (ref 17–63)
ANION GAP: 7 (ref 5–15)
AST: 26 U/L (ref 15–41)
BILIRUBIN TOTAL: 0.9 mg/dL (ref 0.3–1.2)
BUN: 16 mg/dL (ref 6–20)
CO2: 28 mmol/L (ref 22–32)
Calcium: 9.2 mg/dL (ref 8.9–10.3)
Chloride: 104 mmol/L (ref 101–111)
Creatinine, Ser: 1.02 mg/dL (ref 0.61–1.24)
GFR calc Af Amer: >60 mL/min (ref >60)
GFR calc non Af Amer: >60 mL/min (ref >60)
GLUCOSE: 105 mg/dL — AB (ref 65–99)
POTASSIUM: 3.7 mmol/L (ref 3.5–5.1)
SODIUM: 139 mmol/L (ref 135–145)
Total Protein: 7.7 g/dL (ref 6.5–8.1)

## 2018-02-01 LAB — CBC WITH DIFFERENTIAL/PLATELET
BASOS PCT: 1 %
Basophils Absolute: 0 10*3/uL (ref 0.0–0.1)
EOS ABS: 0.3 10*3/uL (ref 0.0–0.7)
Eosinophils Relative: 4 %
HEMATOCRIT: 41.5 % (ref 39.0–52.0)
HEMOGLOBIN: 14.1 g/dL (ref 13.0–17.0)
Lymphocytes Relative: 34 %
Lymphs Abs: 2.3 10*3/uL (ref 0.7–4.0)
MCH: 26.4 pg (ref 26.0–34.0)
MCHC: 34 g/dL (ref 30.0–36.0)
MCV: 77.7 fL — ABNORMAL LOW (ref 78.0–100.0)
Monocytes Absolute: 0.4 10*3/uL (ref 0.1–1.0)
Monocytes Relative: 6 %
NEUTROS ABS: 3.7 10*3/uL (ref 1.7–7.7)
NEUTROS PCT: 55 %
Platelets: 165 10*3/uL (ref 150–400)
RBC: 5.34 MIL/uL (ref 4.22–5.81)
RDW: 14.1 % (ref 11.5–15.5)
WBC: 6.7 10*3/uL (ref 4.0–10.5)

## 2018-02-01 LAB — URINALYSIS, DIPSTICK ONLY
BILIRUBIN URINE: NEGATIVE
Glucose, UA: NEGATIVE mg/dL
Hgb urine dipstick: NEGATIVE
KETONES UR: NEGATIVE mg/dL
Leukocytes, UA: NEGATIVE
NITRITE: NEGATIVE
PROTEIN: NEGATIVE mg/dL
SPECIFIC GRAVITY, URINE: 1.026 (ref 1.005–1.030)
pH: 5 (ref 5.0–8.0)

## 2018-02-01 MED ORDER — DIPHENHYDRAMINE HCL 50 MG/ML IJ SOLN
50.0000 mg | Freq: Once | INTRAMUSCULAR | Status: AC
  Administered 2018-02-01: 50 mg via INTRAVENOUS

## 2018-02-01 MED ORDER — DIPHENHYDRAMINE HCL 50 MG/ML IJ SOLN
INTRAMUSCULAR | Status: AC
Start: 2018-02-01 — End: ?
  Filled 2018-02-01: qty 1

## 2018-02-01 MED ORDER — SODIUM CHLORIDE 0.9 % IV SOLN
8.0000 mg/kg | Freq: Once | INTRAVENOUS | Status: AC
  Administered 2018-02-01: 800 mg via INTRAVENOUS
  Filled 2018-02-01: qty 30

## 2018-02-01 MED ORDER — SODIUM CHLORIDE 0.9 % IV SOLN
Freq: Once | INTRAVENOUS | Status: AC
  Administered 2018-02-01: 10:00:00 via INTRAVENOUS

## 2018-02-01 MED ORDER — HEPARIN SOD (PORK) LOCK FLUSH 100 UNIT/ML IV SOLN
500.0000 [IU] | Freq: Once | INTRAVENOUS | Status: AC
  Administered 2018-02-01: 500 [IU] via INTRAVENOUS

## 2018-02-01 NOTE — Progress Notes (Signed)
Tolerated infusion w/o adverse reaction.  Alert, in no distress.  VSS.  Discharged ambulatory.  

## 2018-02-15 ENCOUNTER — Inpatient Hospital Stay (HOSPITAL_COMMUNITY): Payer: Managed Care, Other (non HMO)

## 2018-02-15 ENCOUNTER — Encounter (HOSPITAL_COMMUNITY): Payer: Self-pay

## 2018-02-15 ENCOUNTER — Inpatient Hospital Stay (HOSPITAL_COMMUNITY): Payer: Managed Care, Other (non HMO) | Attending: Hematology

## 2018-02-15 VITALS — BP 113/85 | HR 65 | Temp 97.9°F | Resp 16 | Wt 211.9 lb

## 2018-02-15 DIAGNOSIS — C770 Secondary and unspecified malignant neoplasm of lymph nodes of head, face and neck: Secondary | ICD-10-CM | POA: Insufficient documentation

## 2018-02-15 DIAGNOSIS — C155 Malignant neoplasm of lower third of esophagus: Secondary | ICD-10-CM | POA: Diagnosis present

## 2018-02-15 DIAGNOSIS — C159 Malignant neoplasm of esophagus, unspecified: Secondary | ICD-10-CM

## 2018-02-15 DIAGNOSIS — Z5111 Encounter for antineoplastic chemotherapy: Secondary | ICD-10-CM

## 2018-02-15 DIAGNOSIS — Z5112 Encounter for antineoplastic immunotherapy: Secondary | ICD-10-CM | POA: Insufficient documentation

## 2018-02-15 DIAGNOSIS — C158 Malignant neoplasm of overlapping sites of esophagus: Secondary | ICD-10-CM

## 2018-02-15 LAB — CBC WITH DIFFERENTIAL/PLATELET
Basophils Absolute: 0 10*3/uL (ref 0.0–0.1)
Basophils Relative: 1 %
Eosinophils Absolute: 0.3 10*3/uL (ref 0.0–0.7)
Eosinophils Relative: 4 %
HEMATOCRIT: 40.8 % (ref 39.0–52.0)
HEMOGLOBIN: 14.2 g/dL (ref 13.0–17.0)
LYMPHS PCT: 44 %
Lymphs Abs: 2.8 10*3/uL (ref 0.7–4.0)
MCH: 26.9 pg (ref 26.0–34.0)
MCHC: 34.8 g/dL (ref 30.0–36.0)
MCV: 77.3 fL — AB (ref 78.0–100.0)
MONOS PCT: 6 %
Monocytes Absolute: 0.4 10*3/uL (ref 0.1–1.0)
NEUTROS ABS: 2.9 10*3/uL (ref 1.7–7.7)
NEUTROS PCT: 45 %
Platelets: 166 10*3/uL (ref 150–400)
RBC: 5.28 MIL/uL (ref 4.22–5.81)
RDW: 13.7 % (ref 11.5–15.5)
WBC: 6.4 10*3/uL (ref 4.0–10.5)

## 2018-02-15 LAB — COMPREHENSIVE METABOLIC PANEL
ALBUMIN: 4.1 g/dL (ref 3.5–5.0)
ALK PHOS: 69 U/L (ref 38–126)
ALT: 22 U/L (ref 0–44)
ANION GAP: 7 (ref 5–15)
AST: 25 U/L (ref 15–41)
BILIRUBIN TOTAL: 0.6 mg/dL (ref 0.3–1.2)
BUN: 25 mg/dL — AB (ref 6–20)
CALCIUM: 9.1 mg/dL (ref 8.9–10.3)
CO2: 28 mmol/L (ref 22–32)
Chloride: 103 mmol/L (ref 98–111)
Creatinine, Ser: 1.3 mg/dL — ABNORMAL HIGH (ref 0.61–1.24)
GFR calc Af Amer: >60 mL/min (ref >60)
GLUCOSE: 97 mg/dL (ref 70–99)
Potassium: 4 mmol/L (ref 3.5–5.1)
Sodium: 138 mmol/L (ref 135–145)
Total Protein: 7.5 g/dL (ref 6.5–8.1)

## 2018-02-15 LAB — URINALYSIS, DIPSTICK ONLY
BILIRUBIN URINE: NEGATIVE
GLUCOSE, UA: NEGATIVE mg/dL
HGB URINE DIPSTICK: NEGATIVE
KETONES UR: NEGATIVE mg/dL
Nitrite: NEGATIVE
PROTEIN: NEGATIVE mg/dL
Specific Gravity, Urine: 1.025 (ref 1.005–1.030)
pH: 5 (ref 5.0–8.0)

## 2018-02-15 MED ORDER — HEPARIN SOD (PORK) LOCK FLUSH 100 UNIT/ML IV SOLN
500.0000 [IU] | Freq: Once | INTRAVENOUS | Status: AC | PRN
  Administered 2018-02-15: 500 [IU]

## 2018-02-15 MED ORDER — DIPHENHYDRAMINE HCL 50 MG/ML IJ SOLN
INTRAMUSCULAR | Status: AC
  Filled 2018-02-15: qty 1

## 2018-02-15 MED ORDER — SODIUM CHLORIDE 0.9% FLUSH
10.0000 mL | INTRAVENOUS | Status: DC | PRN
  Administered 2018-02-15: 10 mL
  Filled 2018-02-15: qty 10

## 2018-02-15 MED ORDER — SODIUM CHLORIDE 0.9 % IV SOLN
Freq: Once | INTRAVENOUS | Status: AC
  Administered 2018-02-15: 10:00:00 via INTRAVENOUS

## 2018-02-15 MED ORDER — RAMUCIRUMAB CHEMO INJECTION 500 MG/50ML
8.0000 mg/kg | Freq: Once | INTRAVENOUS | Status: AC
  Administered 2018-02-15: 800 mg via INTRAVENOUS
  Filled 2018-02-15: qty 30

## 2018-02-15 MED ORDER — DIPHENHYDRAMINE HCL 50 MG/ML IJ SOLN
50.0000 mg | Freq: Once | INTRAMUSCULAR | Status: AC
  Administered 2018-02-15: 50 mg via INTRAVENOUS

## 2018-02-15 NOTE — Patient Instructions (Addendum)
[facility] Discharge Instructions for Patients Receiving Chemotherapy  Today you received the following chemotherapy agents cyramza.    If you develop nausea and vomiting that is not controlled by your nausea medication, call the clinic.   BELOW ARE SYMPTOMS THAT SHOULD BE REPORTED IMMEDIATELY:  *FEVER GREATER THAN 100.5 F  *CHILLS WITH OR WITHOUT FEVER  NAUSEA AND VOMITING THAT IS NOT CONTROLLED WITH YOUR NAUSEA MEDICATION  *UNUSUAL SHORTNESS OF BREATH  *UNUSUAL BRUISING OR BLEEDING  TENDERNESS IN MOUTH AND THROAT WITH OR WITHOUT PRESENCE OF ULCERS  *URINARY PROBLEMS  *BOWEL PROBLEMS  UNUSUAL RASH Items with * indicate a potential emergency and should be followed up as soon as possible.  Feel free to call the clinic should you have any questions or concerns. The clinic phone number is [phone].  Please show the CHEMO ALERT CARD at check-in to the Emergency Department and triage nurse.   

## 2018-02-15 NOTE — Progress Notes (Signed)
Reviewed lab work with Dr. Delton Coombes and Serum Creatinine 1.30 and ok to treat today.   Patient tolerated chemotherapy with no complaints voiced.  Port site clean and dry with good blood return noted before and after administration of therapy.  No complaints of pain with flush.  Band aid applied.  VSS with discharge and left ambulatory with no s/s of distress noted.

## 2018-02-22 ENCOUNTER — Other Ambulatory Visit (HOSPITAL_COMMUNITY): Payer: Self-pay | Admitting: *Deleted

## 2018-02-22 DIAGNOSIS — C159 Malignant neoplasm of esophagus, unspecified: Secondary | ICD-10-CM

## 2018-02-22 DIAGNOSIS — G6289 Other specified polyneuropathies: Secondary | ICD-10-CM

## 2018-02-22 MED ORDER — METFORMIN HCL 500 MG PO TABS: 1000.0000 mg | ORAL_TABLET | Freq: Every day | ORAL | 3 refills | Status: DC

## 2018-02-22 MED ORDER — LORATADINE 10 MG PO TABS: 10.0000 mg | ORAL_TABLET | Freq: Every day | ORAL | 3 refills | Status: DC

## 2018-02-26 ENCOUNTER — Other Ambulatory Visit (HOSPITAL_COMMUNITY): Payer: Self-pay | Admitting: Nurse Practitioner

## 2018-02-26 DIAGNOSIS — C159 Malignant neoplasm of esophagus, unspecified: Secondary | ICD-10-CM

## 2018-02-27 ENCOUNTER — Other Ambulatory Visit (HOSPITAL_COMMUNITY): Payer: Self-pay | Admitting: *Deleted

## 2018-02-27 ENCOUNTER — Ambulatory Visit (HOSPITAL_COMMUNITY): Admission: RE | Admit: 2018-02-27 | Payer: Managed Care, Other (non HMO) | Source: Ambulatory Visit

## 2018-02-27 DIAGNOSIS — C159 Malignant neoplasm of esophagus, unspecified: Secondary | ICD-10-CM

## 2018-02-27 MED ORDER — PROCHLORPERAZINE MALEATE 10 MG PO TABS: ORAL_TABLET | ORAL | 3 refills | Status: DC

## 2018-02-27 NOTE — Telephone Encounter (Signed)
Compazine refilled per standing orders by Dr. Delton Coombes

## 2018-03-01 ENCOUNTER — Ambulatory Visit (HOSPITAL_COMMUNITY): Payer: Managed Care, Other (non HMO) | Admitting: Oncology

## 2018-03-01 ENCOUNTER — Other Ambulatory Visit: Payer: Self-pay

## 2018-03-01 ENCOUNTER — Inpatient Hospital Stay (HOSPITAL_COMMUNITY): Payer: Managed Care, Other (non HMO)

## 2018-03-01 ENCOUNTER — Encounter (HOSPITAL_COMMUNITY): Payer: Self-pay

## 2018-03-01 VITALS — BP 115/81 | HR 80 | Temp 98.6°F | Resp 18 | Wt 205.0 lb

## 2018-03-01 DIAGNOSIS — Z5112 Encounter for antineoplastic immunotherapy: Secondary | ICD-10-CM | POA: Diagnosis not present

## 2018-03-01 DIAGNOSIS — C159 Malignant neoplasm of esophagus, unspecified: Secondary | ICD-10-CM

## 2018-03-01 DIAGNOSIS — Z5111 Encounter for antineoplastic chemotherapy: Secondary | ICD-10-CM

## 2018-03-01 LAB — COMPREHENSIVE METABOLIC PANEL
ALBUMIN: 4.7 g/dL (ref 3.5–5.0)
ALK PHOS: 59 U/L (ref 38–126)
ALT: 28 U/L (ref 0–44)
ANION GAP: 9 (ref 5–15)
AST: 31 U/L (ref 15–41)
BUN: 23 mg/dL — ABNORMAL HIGH (ref 6–20)
CALCIUM: 9.6 mg/dL (ref 8.9–10.3)
CHLORIDE: 102 mmol/L (ref 98–111)
CO2: 28 mmol/L (ref 22–32)
Creatinine, Ser: 1.49 mg/dL — ABNORMAL HIGH (ref 0.61–1.24)
GFR calc Af Amer: >60 mL/min (ref >60)
GFR calc non Af Amer: 53 mL/min — ABNORMAL LOW (ref >60)
GLUCOSE: 93 mg/dL (ref 70–99)
Potassium: 3.5 mmol/L (ref 3.5–5.1)
SODIUM: 139 mmol/L (ref 135–145)
Total Bilirubin: 1.1 mg/dL (ref 0.3–1.2)
Total Protein: 8.6 g/dL — ABNORMAL HIGH (ref 6.5–8.1)

## 2018-03-01 LAB — CBC WITH DIFFERENTIAL/PLATELET
BASOS PCT: 1 %
Basophils Absolute: 0.1 10*3/uL (ref 0.0–0.1)
EOS ABS: 0.2 10*3/uL (ref 0.0–0.7)
Eosinophils Relative: 3 %
HCT: 43.1 % (ref 39.0–52.0)
HEMOGLOBIN: 14.8 g/dL (ref 13.0–17.0)
Lymphocytes Relative: 36 %
Lymphs Abs: 2.6 10*3/uL (ref 0.7–4.0)
MCH: 26.1 pg (ref 26.0–34.0)
MCHC: 34.3 g/dL (ref 30.0–36.0)
MCV: 75.9 fL — ABNORMAL LOW (ref 78.0–100.0)
Monocytes Absolute: 0.5 10*3/uL (ref 0.1–1.0)
Monocytes Relative: 7 %
NEUTROS PCT: 53 %
Neutro Abs: 3.8 10*3/uL (ref 1.7–7.7)
Platelets: 198 10*3/uL (ref 150–400)
RBC: 5.68 MIL/uL (ref 4.22–5.81)
RDW: 13.9 % (ref 11.5–15.5)
WBC: 7.1 10*3/uL (ref 4.0–10.5)

## 2018-03-01 LAB — URINALYSIS, DIPSTICK ONLY
Bilirubin Urine: NEGATIVE
GLUCOSE, UA: NEGATIVE mg/dL
Hgb urine dipstick: NEGATIVE
KETONES UR: 5 mg/dL — AB
LEUKOCYTES UA: NEGATIVE
Nitrite: NEGATIVE
PH: 5 (ref 5.0–8.0)
Protein, ur: NEGATIVE mg/dL
SPECIFIC GRAVITY, URINE: 1.026 (ref 1.005–1.030)

## 2018-03-01 MED ORDER — HEPARIN SOD (PORK) LOCK FLUSH 100 UNIT/ML IV SOLN
500.0000 [IU] | Freq: Once | INTRAVENOUS | Status: AC | PRN
  Administered 2018-03-01: 500 [IU]

## 2018-03-01 MED ORDER — DIPHENHYDRAMINE HCL 50 MG/ML IJ SOLN
INTRAMUSCULAR | Status: AC
  Filled 2018-03-01: qty 1

## 2018-03-01 MED ORDER — DIPHENHYDRAMINE HCL 50 MG/ML IJ SOLN
50.0000 mg | Freq: Once | INTRAMUSCULAR | Status: AC
Start: 2018-03-01 — End: 2018-03-01
  Administered 2018-03-01: 50 mg via INTRAVENOUS

## 2018-03-01 MED ORDER — SODIUM CHLORIDE 0.9 % IV SOLN
8.0000 mg/kg | Freq: Once | INTRAVENOUS | Status: AC
  Administered 2018-03-01: 800 mg via INTRAVENOUS
  Filled 2018-03-01: qty 50

## 2018-03-01 MED ORDER — SODIUM CHLORIDE 0.9 % IV SOLN
Freq: Once | INTRAVENOUS | Status: AC
  Administered 2018-03-01: 10:00:00 via INTRAVENOUS

## 2018-03-01 NOTE — Progress Notes (Signed)
Tolerated infusion w/o adverse reaction.  Alert, in no distress.  VSS.  Discharged ambulatory.  

## 2018-03-01 NOTE — Progress Notes (Signed)
Nutrition Follow-up:  Patient with stage IV esophageal adenocarcinoma.  Patient receiving cyramza.    Met with patient during infusion today for nutrition follow-up. Patient reports appetite is good despite recent weight loss.   No nutrition impact symptoms today reported.   Medications: reviewed  Labs: reviewed  Anthropometrics:   Weight decreased to 205 today from 207 lb on 6/7 last RD visit   NUTRITION DIAGNOSIS: Inadequate oral intake stable   INTERVENTION:   Encouraged patient to continue to focus on high calorie, high protein foods to help with keeping weight stable    MONITORING, EVALUATION, GOAL: weight trends, intake   NEXT VISIT: as needed  Maxwell Henderson B. Zenia Resides, [place], [place] Registered Dietitian [phone] (pager)

## 2018-03-15 ENCOUNTER — Inpatient Hospital Stay (HOSPITAL_COMMUNITY): Payer: 59

## 2018-03-15 ENCOUNTER — Inpatient Hospital Stay (HOSPITAL_COMMUNITY): Payer: 59 | Attending: Oncology | Admitting: Hematology

## 2018-03-15 ENCOUNTER — Encounter (HOSPITAL_COMMUNITY): Payer: Self-pay | Admitting: Hematology

## 2018-03-15 VITALS — BP 129/90 | HR 85 | Temp 97.2°F | Resp 18 | Wt 206.0 lb

## 2018-03-15 DIAGNOSIS — Z9221 Personal history of antineoplastic chemotherapy: Secondary | ICD-10-CM | POA: Insufficient documentation

## 2018-03-15 DIAGNOSIS — C778 Secondary and unspecified malignant neoplasm of lymph nodes of multiple regions: Secondary | ICD-10-CM | POA: Diagnosis not present

## 2018-03-15 DIAGNOSIS — Z5112 Encounter for antineoplastic immunotherapy: Secondary | ICD-10-CM | POA: Insufficient documentation

## 2018-03-15 DIAGNOSIS — C770 Secondary and unspecified malignant neoplasm of lymph nodes of head, face and neck: Secondary | ICD-10-CM | POA: Diagnosis not present

## 2018-03-15 DIAGNOSIS — Z79899 Other long term (current) drug therapy: Secondary | ICD-10-CM | POA: Insufficient documentation

## 2018-03-15 DIAGNOSIS — G620 Drug-induced polyneuropathy: Secondary | ICD-10-CM | POA: Diagnosis not present

## 2018-03-15 DIAGNOSIS — Z87891 Personal history of nicotine dependence: Secondary | ICD-10-CM

## 2018-03-15 DIAGNOSIS — C155 Malignant neoplasm of lower third of esophagus: Secondary | ICD-10-CM

## 2018-03-15 DIAGNOSIS — R911 Solitary pulmonary nodule: Secondary | ICD-10-CM

## 2018-03-15 DIAGNOSIS — G629 Polyneuropathy, unspecified: Secondary | ICD-10-CM | POA: Insufficient documentation

## 2018-03-15 DIAGNOSIS — R7989 Other specified abnormal findings of blood chemistry: Secondary | ICD-10-CM

## 2018-03-15 DIAGNOSIS — G6289 Other specified polyneuropathies: Secondary | ICD-10-CM

## 2018-03-15 DIAGNOSIS — C159 Malignant neoplasm of esophagus, unspecified: Secondary | ICD-10-CM

## 2018-03-15 DIAGNOSIS — R944 Abnormal results of kidney function studies: Secondary | ICD-10-CM | POA: Insufficient documentation

## 2018-03-15 DIAGNOSIS — Z5111 Encounter for antineoplastic chemotherapy: Secondary | ICD-10-CM

## 2018-03-15 DIAGNOSIS — C158 Malignant neoplasm of overlapping sites of esophagus: Secondary | ICD-10-CM

## 2018-03-15 LAB — COMPREHENSIVE METABOLIC PANEL
ALBUMIN: 4.9 g/dL (ref 3.5–5.0)
ALT: 26 U/L (ref 0–44)
AST: 36 U/L (ref 15–41)
Alkaline Phosphatase: 66 U/L (ref 38–126)
Anion gap: 11 (ref 5–15)
BILIRUBIN TOTAL: 1.4 mg/dL — AB (ref 0.3–1.2)
BUN: 33 mg/dL — AB (ref 6–20)
CALCIUM: 9.5 mg/dL (ref 8.9–10.3)
CO2: 27 mmol/L (ref 22–32)
CREATININE: 2.24 mg/dL — AB (ref 0.61–1.24)
Chloride: 99 mmol/L (ref 98–111)
GFR, EST AFRICAN AMERICAN: 38 mL/min — AB (ref >60)
GFR, EST NON AFRICAN AMERICAN: 32 mL/min — AB (ref >60)
Glucose, Bld: 170 mg/dL — ABNORMAL HIGH (ref 70–99)
Potassium: 3.6 mmol/L (ref 3.5–5.1)
Sodium: 137 mmol/L (ref 135–145)
TOTAL PROTEIN: 8.8 g/dL — AB (ref 6.5–8.1)

## 2018-03-15 LAB — URINALYSIS, DIPSTICK ONLY
Bilirubin Urine: NEGATIVE
Glucose, UA: NEGATIVE mg/dL
HGB URINE DIPSTICK: NEGATIVE
KETONES UR: NEGATIVE mg/dL
Leukocytes, UA: NEGATIVE
NITRITE: NEGATIVE
Protein, ur: 30 mg/dL — AB
Specific Gravity, Urine: 1.023 (ref 1.005–1.030)
pH: 5 (ref 5.0–8.0)

## 2018-03-15 LAB — CBC WITH DIFFERENTIAL/PLATELET
BASOS PCT: 0 %
Basophils Absolute: 0 10*3/uL (ref 0.0–0.1)
EOS ABS: 0.2 10*3/uL (ref 0.0–0.7)
Eosinophils Relative: 2 %
HEMATOCRIT: 44.4 % (ref 39.0–52.0)
Hemoglobin: 15.6 g/dL (ref 13.0–17.0)
LYMPHS ABS: 2.9 10*3/uL (ref 0.7–4.0)
Lymphocytes Relative: 38 %
MCH: 26.8 pg (ref 26.0–34.0)
MCHC: 35.1 g/dL (ref 30.0–36.0)
MCV: 76.3 fL — ABNORMAL LOW (ref 78.0–100.0)
MONO ABS: 0.5 10*3/uL (ref 0.1–1.0)
MONOS PCT: 7 %
NEUTROS ABS: 3.8 10*3/uL (ref 1.7–7.7)
Neutrophils Relative %: 53 %
Platelets: 232 10*3/uL (ref 150–400)
RBC: 5.82 MIL/uL — ABNORMAL HIGH (ref 4.22–5.81)
RDW: 14.2 % (ref 11.5–15.5)
WBC: 7.4 10*3/uL (ref 4.0–10.5)

## 2018-03-15 MED ORDER — SODIUM CHLORIDE 0.9 % IV SOLN
INTRAVENOUS | Status: DC
  Administered 2018-03-15: 1000 mL via INTRAVENOUS

## 2018-03-15 MED ORDER — SODIUM CHLORIDE 0.9% FLUSH
10.0000 mL | Freq: Once | INTRAVENOUS | Status: AC
  Administered 2018-03-15: 10 mL via INTRAVENOUS

## 2018-03-15 MED ORDER — AMLODIPINE BESYLATE 10 MG PO TABS: 10.0000 mg | ORAL_TABLET | Freq: Every day | ORAL | 2 refills | Status: DC

## 2018-03-15 MED ORDER — GABAPENTIN 300 MG PO CAPS: 300.0000 mg | ORAL_CAPSULE | Freq: Two times a day (BID) | ORAL | 2 refills | Status: DC

## 2018-03-15 MED ORDER — HEPARIN SOD (PORK) LOCK FLUSH 100 UNIT/ML IV SOLN
500.0000 [IU] | Freq: Once | INTRAVENOUS | Status: AC
  Administered 2018-03-15: 500 [IU] via INTRAVENOUS

## 2018-03-15 NOTE — Progress Notes (Signed)
Treatment held today due to Ser Creat 2.24 and to receive hydration over 2 hours.  Pharmacy notified of plans.  Patient has med list that was reviewed with him by Dr. Delton Coombes with changes made in list.  Medication list updated.   Patient tolerated hydration with no complaints voiced.  Port site clean and dry with no bruising or swelling noted.  Good blood return noted before and after administration of hydration.  Band aid applied.  VSs with discharge and left ambulatory with no s/s of distress noted.

## 2018-03-15 NOTE — Patient Instructions (Signed)
[place] at [facility]  Discharge Instructions:  You received hydration today.   Return on Monday for lab check.  _______________________________________________________________  Thank you for choosing [place] at [facility] to provide your oncology and hematology care.  To afford each patient quality time with our providers, please arrive at least 15 minutes before your scheduled appointment.  You need to re-schedule your appointment if you arrive 10 or more minutes late.  We strive to give you quality time with our providers, and arriving late affects you and other patients whose appointments are after yours.  Also, if you no show three or more times for appointments you may be dismissed from the clinic.  Again, thank you for choosing [place] at [place] hope is that these requests will allow you access to exceptional care and in a timely manner. _______________________________________________________________  If you have questions after your visit, please contact our office at [phone] between the hours of 8:30 a.m. and 5:00 p.m. Voicemails left after 4:30 p.m. will not be returned until the following business day. _______________________________________________________________  For prescription refill requests, have your pharmacy contact our office. _______________________________________________________________  Recommendations made by the consultant and any test results will be sent to your referring physician. _______________________________________________________________

## 2018-03-15 NOTE — Assessment & Plan Note (Signed)
1.  Stage IV GE junction adenocarcinoma: - 6 cycles of FOLFOX from 02/15/2017 through 04/25/2017 -Paclitaxel and Cyramza from 05/18/2017, paclitaxel discontinued on 09/21/2017 secondary to neuropathy - Foundation 1 CDX showing MS-stable, TMB 3Muts/mb - CT scan on 11/28/2017 showing 4 mm right lower lobe nodule which is new, with stable disease elsewhere. - He was admitted to the hospital from 12/10/2017 through 12/12/2017 with pneumonia from parainfluenza 3. - Today his creatinine is found to be high around 2.4.  It has gone up from last time.  I have told him to hold off on lisinopril/HCTZ.  He is also taking extra dose of HCTZ.  We will hold his metformin.  I will also hold his Cyramza today.  We will hydrate him and recheck his creatinine Monday.  Once the creatinine improves, I will schedule his CT scans.  We will see him back after the CT scans. 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  He is also experiencing more neuropathy with pains in his feet due to standing all day.  I think it is reasonable for him to be out of his work given his metastatic disease.  2.  Peripheral neuropathy: He has bilateral toe numbness which hurt at times. Taxol was discontinued on 09/21/2017.  He has not truly taken gabapentin continuously in the past.  He is taking gabapentin twice a day.

## 2018-03-15 NOTE — Patient Instructions (Signed)
[place] at [facility] Discharge Instructions  Please return on Monday to have Labs drawn.    Thank you for choosing [place] at [facility] to provide your oncology and hematology care.  To afford each patient quality time with our provider, please arrive at least 15 minutes before your scheduled appointment time.   If you have a lab appointment with the [facility] please come in thru the  Main Entrance and check in at the main information desk  You need to re-schedule your appointment should you arrive 10 or more minutes late.  We strive to give you quality time with our providers, and arriving late affects you and other patients whose appointments are after yours.  Also, if you no show three or more times for appointments you may be dismissed from the clinic at the providers discretion.     Again, thank you for choosing [facility].  Our hope is that these requests will decrease the amount of time that you wait before being seen by our physicians.       _____________________________________________________________  Should you have questions after your visit to [facility], please contact our office at [phone] between the hours of 8:00 a.m. and 4:30 p.m.  Voicemails left after 4:00 p.m. will not be returned until the following business day.  For prescription refill requests, have your pharmacy contact our office and allow 72 hours.    Cancer Center Support Programs:   > Cancer Support Group  2nd Tuesday of the month 1pm-2pm, Journey Room

## 2018-03-15 NOTE — Progress Notes (Signed)
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 Barney Drain. Partially obstructing, likely malignant esophageal tumor was found in the distal esophagus. Biopsied with surgical path demonstrating Adenocarcinoma. Injected. Treated with argon plasma coagulation (APC) #3. 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 Biopsied with surgical path demonstrating Adenocarcinoma.      01/19/2017 PET scan    NECK Two FDG avid nodes are seen in the base of the neck on the right. The first is seen on CT image 35, just posterior to the right thyroid lobe and the other is seen on CT image 41 measuring 16 mm in short axis with a maximum SUV of 8.6.  CHEST  The patient's known malignancy involving the distal esophagus and cardia of the stomach is FDG avid with a maximum SUV of 10.4. No definitive metastatic nodes within the chest.  ABDOMEN/PELVIS  FDG avid metastatic adenopathy is seen in the gastrohepatic ligament, paraceliac nodes, posterior to the IVC on image 111, and to the left of the SMA on image 112. The most inferior node is seen in the aortocaval region on image 117. The representative node to the left of the SMA was measured on series 4, image 111 measuring 2.5 by 2.1 cm with a maximum SUV of 7. No other FDG avid disease in the abdomen or pelvis. Cholelithiasis is identified.      02/02/2017 Procedure    US guided bx of right supraclavicular LN by IR.      02/05/2017 Pathology Results    Lymph node, needle/core biopsy, Right  Supraclavicular - METASTATIC POORLY DIFFERENTIATED CARCINOMA.      02/10/2017 Pathology Results    HER2 - NEGATIVE      02/13/2017 Procedure    Port placed by IR      02/15/2017 - 04/25/2017 Chemotherapy    FOLFOX x 6 cycles       05/02/2017 Imaging    CT C/A/P: IMPRESSION: 1. Nodular thickening and hyperenhancement in the distal esophagus and gastric cardia could reflect residual tumor. 2. No significant change in size of previously demonstrated hypermetabolic upper abdominal lymph nodes. No progressive adenopathy identified. No residual enlarged supraclavicular nodes are seen. 3. No evidence of progressive metastatic disease. No worrisome hepatic findings. 4. Tiny right upper lobe pulmonary nodule, not clearly seen on low dose previous PET-CT. Attention on follow-up recommended. 5. Cholelithiasis.        CANCER STAGING: Cancer Staging Primary esophageal adenocarcinoma [facility]) Staging form: Esophagus - Adenocarcinoma, AJCC 8th Edition - Clinical stage from 02/11/2017: Stage IVA (cTX, cN3, cM0) - Signed by Baird Cancer, PA-C on 02/11/2017    INTERVAL HISTORY:  Maxwell Henderson 52 y.o. male returns for routine follow-up for GE junction adenocarcinoma. Patient is doing well with treatment. His only complaint is his feet are numb. Mostly his toes and his heels. He has been working in the direct sun lately and has noticed more fatigue than usual. He is asking about being taken out of work for disability. He will bring the [facility] paperwork on Monday. He is  currently working everyday for a [employer]. Patient appetite and energy level are at 75%. He will return Monday for labs to monitor his creatine.     REVIEW OF SYSTEMS:  Review of Systems  Constitutional: Negative.   HENT:  Negative.   Eyes: Negative.   Respiratory: Negative.   Cardiovascular: Negative.   Gastrointestinal: Negative.   Endocrine: Negative.   Genitourinary: Negative.    Musculoskeletal: Negative.     Skin: Negative.   Neurological: Positive for numbness (feet).  Hematological: Negative.   Psychiatric/Behavioral: Negative.      PAST MEDICAL/SURGICAL HISTORY:  Past Medical History:  Diagnosis Date  . Diabetes mellitus without complication ([place])   . Hypertension   . 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 ESOPHAGOGASTRODUODENOSCOPY (EGD) WITH PROPOFOL N/A 01/16/2017   Procedure: ESOPHAGOGASTRODUODENOSCOPY (EGD) WITH PROPOFOL;  Surgeon: [name], MD;  Location: [facility];  Service: Endoscopy;  Laterality: N/A;  730   . IR FLUORO GUIDE PORT INSERTION RIGHT  02/13/2017  . IR US GUIDE VASC ACCESS RIGHT  02/13/2017  . lipoma removal    . PORTA CATH INSERTION Right 02/13/2017  . SAVORY DILATION N/A 01/16/2017   Procedure: SAVORY DILATION;  Surgeon: [name], MD;  Location: [facility];  Service: Endoscopy;  Laterality: N/A;     SOCIAL HISTORY:  Social History   Socioeconomic History  . Marital status: Married    Spouse name: Not on file  . Number of children: Not on file  . Years of education: Not on file  . Highest education level: Not on file  Occupational History  . Not on file  Social Needs  . Financial resource strain: Not on file  . Food insecurity:    Worry: Not on file    Inability: Not on file  . Transportation needs:    Medical: Not on file    Non-medical: Not on file  Tobacco Use  . Smoking status: Former Smoker    Packs/day: 0.25    Types: Cigarettes  . Smokeless tobacco: Never Used  Substance and Sexual Activity  . Alcohol use: No    Comment: hx heavy alcohol, hasn't drank in 19 yrs  . Drug use: Yes    Types: Marijuana    Comment: history of marijuana   . Sexual activity: Yes  Lifestyle  . Physical activity:    Days per week: Not on file    Minutes per session: Not on file  . Stress: Not on file   Relationships  . Social connections:    Talks on phone: Not on file    Gets together: Not on file    Attends religious service: Not on file    Active member of club or organization: Not on file    Attends meetings of clubs or organizations: Not on file    Relationship status: Not on file  . Intimate partner violence:    Fear of current or ex partner: Not on file    Emotionally abused: Not on file    Physically abused: Not on file    Forced sexual activity: Not on file  Other Topics Concern  . Not on file  Social History Narrative   WORKING [employer].    FAMILY HISTORY:  Family History  Problem Relation Age of Onset  . Prostate cancer Father   . Colon cancer Neg  Hx   . Colon polyps Neg Hx     CURRENT MEDICATIONS:  Outpatient Encounter Medications as of 03/15/2018  Medication Sig  . acetaminophen (TYLENOL) 325 MG tablet Take 2 tablets (650 mg total) by mouth every 4 (four) hours as needed for headache.  . Alpha-Lipoic Acid 600 MG CAPS Take 1 capsule (600 mg total) by mouth daily.  Marland Kitchen amLODipine (NORVASC) 10 MG tablet Take 1 tablet (10 mg total) by mouth daily.  . B Complex-C (B-COMPLEX WITH VITAMIN C) tablet Take 1 tablet by mouth daily.  Marland Kitchen lidocaine-prilocaine (EMLA) cream Apply 1 application topically as needed.  . loratadine (CLARITIN) 10 MG tablet Take 1 tablet (10 mg total) by mouth daily.  . Misc. Devices MISC Please provide the patient with the [facility] HPB cough Syrup. 1 teaspoonful every 4-6 hours PRN. Please provide the patient with 360 ml.  . Morphine Sulfate (MORPHINE CONCENTRATE) 10 mg / 0.5 ml concentrated solution Take 0.5 mLs (10 mg total) by mouth every 4 (four) hours as needed for severe pain.  . pantoprazole (PROTONIX) 40 MG tablet Take 1 tablet (40 mg total) by mouth 2 (two) times daily. Take 30 minutes before breakfast  . prochlorperazine (COMPAZINE) 10 MG tablet TAKE 1 TABLET BY MOUTH EVERY 6 HOURS AS NEEDED FOR NAUSEA AND VOMITING  .  Ramucirumab (CYRAMZA IV) Inject into the vein every 14 (fourteen) days. Fridays of every other week  . ranitidine (ZANTAC) 300 MG tablet Take 1 tablet (300 mg total) by mouth at bedtime.  . [DISCONTINUED] gabapentin (NEURONTIN) 300 MG capsule Take 1 capsule (300 mg total) by mouth 2 (two) times daily.  . [DISCONTINUED] hydrochlorothiazide (MICROZIDE) 12.5 MG capsule Take 1 capsule (12.5 mg total) by mouth daily.  . [DISCONTINUED] hydrochlorothiazide (MICROZIDE) 12.5 MG capsule TAKE 1 CAPSULE BY MOUTH ONCE DAILY  . [DISCONTINUED] losartan-hydrochlorothiazide (HYZAAR) 50-12.5 MG tablet Take 1 tablet by mouth daily.  . [DISCONTINUED] metFORMIN (GLUCOPHAGE) 500 MG tablet Take 2 tablets (1,000 mg total) by mouth daily.   No facility-administered encounter medications on file as of 03/15/2018.     ALLERGIES:  No Known Allergies   PHYSICAL EXAM:  ECOG Performance status: 1  VITAL SIGNS: BP: 126/90, P:85, R:18, TEMP:97.2, SATS: 100%  Physical Exam  Constitutional: He is oriented to person, place, and time.  Cardiovascular: Normal rate, regular rhythm and normal heart sounds.  Pulmonary/Chest: Effort normal and breath sounds normal.  Neurological: He is alert and oriented to person, place, and time.  Skin: Skin is warm and dry.     LABORATORY DATA:  I have reviewed the labs as listed.  CBC    Component Value Date/Time   WBC 7.4 03/15/2018 0811   RBC 5.82 (H) 03/15/2018 0811   HGB 15.6 03/15/2018 0811   HCT 44.4 03/15/2018 0811   PLT 232 03/15/2018 0811   MCV 76.3 (L) 03/15/2018 0811   MCH 26.8 03/15/2018 0811   MCHC 35.1 03/15/2018 0811   RDW 14.2 03/15/2018 0811   LYMPHSABS 2.9 03/15/2018 0811   MONOABS 0.5 03/15/2018 0811   EOSABS 0.2 03/15/2018 0811   BASOSABS 0.0 03/15/2018 0811   CMP Latest Ref Rng & Units 03/15/2018 03/01/2018 02/15/2018  Glucose 70 - 99 mg/dL 170(H) 93 97  BUN 6 - 20 mg/dL 33(H) 23(H) 25(H)  Creatinine 0.61 - 1.24 mg/dL 2.24(H) 1.49(H) 1.30(H)  Sodium 135 -  145 mmol/L 137 139 138  Potassium 3.5 - 5.1 mmol/L 3.6 3.5 4.0  Chloride 98 - 111 mmol/L 99 102 103  CO2 22 - 32 mmol/L _0 Calcium 8.9 - 10.3 mg/dL 9.5 9.6 9.1  Total Protein 6.5 - 8.1 g/dL 8.8(H) 8.6(H) 7.5  Total Bilirubin 0.3 - 1.2 mg/dL 1.4(H) 1.1 0.6  Alkaline Phos 38 - 126 U/L 66 59 69  AST 15 - 41 U/L 36 31 25  ALT 0 - 44 U/L _1 ASSESSMENT & PLAN:   Primary esophageal adenocarcinoma ([facility]) 1.  Stage IV GE junction adenocarcinoma: - 6 cycles of FOLFOX from 02/15/2017 through 04/25/2017 -Paclitaxel and Cyramza from 05/18/2017, paclitaxel discontinued on 09/21/2017 secondary to neuropathy - Foundation 1 CDX showing MS-stable, TMB 3Muts/mb - CT scan on 11/28/2017 showing 4 mm right lower lobe nodule which is new, with stable disease elsewhere. - He was admitted to the hospital from 12/10/2017 through 12/12/2017 with pneumonia from parainfluenza 3. - Today his creatinine is found to be high around 2.4.  It has gone up from last time.  I have told him to hold off on lisinopril/HCTZ.  He is also taking extra dose of HCTZ.  We will hold his metformin.  I will also hold his Cyramza today.  We will hydrate him and recheck his creatinine Monday.  Once the creatinine improves, I will schedule his CT scans.  We will see him back after the CT scans. 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  He is also experiencing more neuropathy with pains in his feet due to standing all day.  I think it is reasonable for him to be out of his work given his metastatic disease.  2.  Peripheral neuropathy: He has bilateral toe numbness which hurt at times. Taxol was discontinued on 09/21/2017.  He has not truly taken gabapentin continuously in the past.  He is taking gabapentin twice a day.      Orders placed this encounter:  Orders Placed This Encounter  Procedures  . CBC with Differential/Platelet  . Comprehensive metabolic panel      Derek Jack,  MD [place] [phone]

## 2018-03-18 ENCOUNTER — Telehealth (HOSPITAL_COMMUNITY): Payer: Self-pay | Admitting: Nurse Practitioner

## 2018-03-18 ENCOUNTER — Inpatient Hospital Stay (HOSPITAL_COMMUNITY): Payer: 59

## 2018-03-18 DIAGNOSIS — C159 Malignant neoplasm of esophagus, unspecified: Secondary | ICD-10-CM

## 2018-03-18 DIAGNOSIS — Z5112 Encounter for antineoplastic immunotherapy: Secondary | ICD-10-CM | POA: Diagnosis not present

## 2018-03-18 LAB — COMPREHENSIVE METABOLIC PANEL
ALBUMIN: 4 g/dL (ref 3.5–5.0)
ALK PHOS: 49 U/L (ref 38–126)
ALT: 18 U/L (ref 0–44)
AST: 19 U/L (ref 15–41)
Anion gap: 6 (ref 5–15)
BUN: 13 mg/dL (ref 6–20)
CALCIUM: 8.3 mg/dL — AB (ref 8.9–10.3)
CHLORIDE: 106 mmol/L (ref 98–111)
CO2: 26 mmol/L (ref 22–32)
CREATININE: 0.87 mg/dL (ref 0.61–1.24)
GFR calc Af Amer: >60 mL/min (ref >60)
GFR calc non Af Amer: >60 mL/min (ref >60)
GLUCOSE: 111 mg/dL — AB (ref 70–99)
Potassium: 4.3 mmol/L (ref 3.5–5.1)
Sodium: 138 mmol/L (ref 135–145)
Total Bilirubin: 0.6 mg/dL (ref 0.3–1.2)
Total Protein: 7.3 g/dL (ref 6.5–8.1)

## 2018-03-18 LAB — CBC WITH DIFFERENTIAL/PLATELET
BASOS ABS: 0 10*3/uL (ref 0.0–0.1)
BASOS PCT: 0 %
EOS ABS: 0.1 10*3/uL (ref 0.0–0.7)
Eosinophils Relative: 2 %
HCT: 38.5 % — ABNORMAL LOW (ref 39.0–52.0)
HEMOGLOBIN: 12.7 g/dL — AB (ref 13.0–17.0)
Lymphocytes Relative: 31 %
Lymphs Abs: 2 10*3/uL (ref 0.7–4.0)
MCH: 25.5 pg — ABNORMAL LOW (ref 26.0–34.0)
MCHC: 33 g/dL (ref 30.0–36.0)
MCV: 77.2 fL — ABNORMAL LOW (ref 78.0–100.0)
Monocytes Absolute: 0.3 10*3/uL (ref 0.1–1.0)
Monocytes Relative: 5 %
NEUTROS PCT: 62 %
Neutro Abs: 4.1 10*3/uL (ref 1.7–7.7)
PLATELETS: 157 10*3/uL (ref 150–400)
RBC: 4.99 MIL/uL (ref 4.22–5.81)
RDW: 14.3 % (ref 11.5–15.5)
WBC: 6.5 10*3/uL (ref 4.0–10.5)

## 2018-03-21 ENCOUNTER — Inpatient Hospital Stay (HOSPITAL_COMMUNITY): Payer: 59

## 2018-03-21 DIAGNOSIS — C159 Malignant neoplasm of esophagus, unspecified: Secondary | ICD-10-CM

## 2018-03-21 DIAGNOSIS — C158 Malignant neoplasm of overlapping sites of esophagus: Secondary | ICD-10-CM

## 2018-03-21 DIAGNOSIS — Z5111 Encounter for antineoplastic chemotherapy: Secondary | ICD-10-CM

## 2018-03-21 DIAGNOSIS — Z5112 Encounter for antineoplastic immunotherapy: Secondary | ICD-10-CM | POA: Diagnosis not present

## 2018-03-21 LAB — URINALYSIS, DIPSTICK ONLY
Bilirubin Urine: NEGATIVE
GLUCOSE, UA: NEGATIVE mg/dL
Hgb urine dipstick: NEGATIVE
Ketones, ur: NEGATIVE mg/dL
Nitrite: NEGATIVE
PH: 5 (ref 5.0–8.0)
Protein, ur: 30 mg/dL — AB
SPECIFIC GRAVITY, URINE: 1.024 (ref 1.005–1.030)

## 2018-03-21 LAB — COMPREHENSIVE METABOLIC PANEL
ALT: 20 U/L (ref 0–44)
ANION GAP: 8 (ref 5–15)
AST: 23 U/L (ref 15–41)
Albumin: 4.5 g/dL (ref 3.5–5.0)
Alkaline Phosphatase: 49 U/L (ref 38–126)
BUN: 17 mg/dL (ref 6–20)
CO2: 26 mmol/L (ref 22–32)
Calcium: 9.4 mg/dL (ref 8.9–10.3)
Chloride: 104 mmol/L (ref 98–111)
Creatinine, Ser: 1.45 mg/dL — ABNORMAL HIGH (ref 0.61–1.24)
GFR calc Af Amer: >60 mL/min (ref >60)
GFR calc non Af Amer: 55 mL/min — ABNORMAL LOW (ref >60)
Glucose, Bld: 71 mg/dL (ref 70–99)
POTASSIUM: 3.8 mmol/L (ref 3.5–5.1)
SODIUM: 138 mmol/L (ref 135–145)
Total Bilirubin: 0.8 mg/dL (ref 0.3–1.2)
Total Protein: 7.9 g/dL (ref 6.5–8.1)

## 2018-03-21 LAB — CBC WITH DIFFERENTIAL/PLATELET
BASOS ABS: 0 10*3/uL (ref 0.0–0.1)
BASOS PCT: 1 %
EOS ABS: 0.1 10*3/uL (ref 0.0–0.7)
Eosinophils Relative: 1 %
HCT: 38.7 % — ABNORMAL LOW (ref 39.0–52.0)
HEMOGLOBIN: 13.1 g/dL (ref 13.0–17.0)
Lymphocytes Relative: 38 %
Lymphs Abs: 2.6 10*3/uL (ref 0.7–4.0)
MCH: 26.1 pg (ref 26.0–34.0)
MCHC: 33.9 g/dL (ref 30.0–36.0)
MCV: 77.2 fL — ABNORMAL LOW (ref 78.0–100.0)
MONO ABS: 0.4 10*3/uL (ref 0.1–1.0)
MONOS PCT: 5 %
NEUTROS PCT: 55 %
Neutro Abs: 3.7 10*3/uL (ref 1.7–7.7)
Platelets: 212 10*3/uL (ref 150–400)
RBC: 5.01 MIL/uL (ref 4.22–5.81)
RDW: 14.6 % (ref 11.5–15.5)
WBC: 6.8 10*3/uL (ref 4.0–10.5)

## 2018-03-22 ENCOUNTER — Ambulatory Visit (HOSPITAL_COMMUNITY)
Admission: RE | Admit: 2018-03-22 | Discharge: 2018-03-22 | Disposition: A | Discharge status: Home / Self Care | Payer: Managed Care, Other (non HMO) | Source: Ambulatory Visit | Attending: Nurse Practitioner | Admitting: Nurse Practitioner

## 2018-03-22 ENCOUNTER — Inpatient Hospital Stay (HOSPITAL_COMMUNITY): Payer: 59

## 2018-03-22 ENCOUNTER — Encounter (HOSPITAL_COMMUNITY): Payer: Self-pay

## 2018-03-22 VITALS — BP 121/86 | HR 65 | Temp 97.5°F | Resp 18 | Wt 211.0 lb

## 2018-03-22 DIAGNOSIS — C159 Malignant neoplasm of esophagus, unspecified: Secondary | ICD-10-CM | POA: Insufficient documentation

## 2018-03-22 DIAGNOSIS — N2889 Other specified disorders of kidney and ureter: Secondary | ICD-10-CM | POA: Diagnosis not present

## 2018-03-22 DIAGNOSIS — Z5112 Encounter for antineoplastic immunotherapy: Secondary | ICD-10-CM | POA: Diagnosis not present

## 2018-03-22 MED ORDER — SODIUM CHLORIDE 0.9 % IV SOLN
Freq: Once | INTRAVENOUS | Status: AC
  Administered 2018-03-22: 10:00:00 via INTRAVENOUS

## 2018-03-22 MED ORDER — SODIUM CHLORIDE 0.9% FLUSH
10.0000 mL | INTRAVENOUS | Status: DC | PRN
  Administered 2018-03-22: 10 mL
  Filled 2018-03-22: qty 10

## 2018-03-22 MED ORDER — SODIUM CHLORIDE 0.9 % IV SOLN
8.0000 mg/kg | Freq: Once | INTRAVENOUS | Status: AC
  Administered 2018-03-22: 800 mg via INTRAVENOUS
  Filled 2018-03-22: qty 30

## 2018-03-22 MED ORDER — IOPAMIDOL (ISOVUE-300) INJECTION 61%
100.0000 mL | Freq: Once | INTRAVENOUS | Status: AC | PRN
  Administered 2018-03-22: 100 mL via INTRAVENOUS

## 2018-03-22 MED ORDER — SODIUM CHLORIDE 0.9 % IV SOLN
INTRAVENOUS | Status: AC
  Administered 2018-03-22: 08:00:00 via INTRAVENOUS

## 2018-03-22 MED ORDER — DIPHENHYDRAMINE HCL 50 MG/ML IJ SOLN
50.0000 mg | Freq: Once | INTRAMUSCULAR | Status: AC
  Administered 2018-03-22: 50 mg via INTRAVENOUS
  Filled 2018-03-22: qty 1

## 2018-03-22 MED ORDER — HEPARIN SOD (PORK) LOCK FLUSH 100 UNIT/ML IV SOLN
500.0000 [IU] | Freq: Once | INTRAVENOUS | Status: AC | PRN
  Administered 2018-03-22: 500 [IU]
  Filled 2018-03-22: qty 5

## 2018-03-22 NOTE — Progress Notes (Signed)
0810 Labs reviewed with Dr. Delton Coombes and pt approved for Cyramza infusion today as well as hydration with NS 1 L over 2 hrs per MD orders.           Maxwell Henderson tolerated Cyramza infusion and hydration well without complaints or incident. VSS upon discharge. Pt discharged self ambulatory in satisfactory condition

## 2018-03-22 NOTE — Patient Instructions (Signed)
[facility] Discharge Instructions for Patients Receiving Chemotherapy   Beginning January 23rd 2017 lab work for the [facility] will be done in the  Main lab at [facility] on 1st floor. If you have a lab appointment with the [facility] please come in thru the  Main Entrance and check in at the main information desk   Today you received the following chemotherapy agents Cyramza as well as hydration. Follow-up as scheduled. Call clinic for any questions or concerns  To help prevent nausea and vomiting after your treatment, we encourage you to take your nausea medication   If you develop nausea and vomiting, or diarrhea that is not controlled by your medication, call the clinic.  The clinic phone number is [phone]. Office hours are Monday-Friday 8:30am-5:00pm.  BELOW ARE SYMPTOMS THAT SHOULD BE REPORTED IMMEDIATELY:  *FEVER GREATER THAN 101.0 F  *CHILLS WITH OR WITHOUT FEVER  NAUSEA AND VOMITING THAT IS NOT CONTROLLED WITH YOUR NAUSEA MEDICATION  *UNUSUAL SHORTNESS OF BREATH  *UNUSUAL BRUISING OR BLEEDING  TENDERNESS IN MOUTH AND THROAT WITH OR WITHOUT PRESENCE OF ULCERS  *URINARY PROBLEMS  *BOWEL PROBLEMS  UNUSUAL RASH Items with * indicate a potential emergency and should be followed up as soon as possible. If you have an emergency after office hours please contact your primary care physician or go to the nearest emergency department.  Please call the clinic during office hours if you have any questions or concerns.   You may also contact the Patient Navigator at [phone] should you have any questions or need assistance in obtaining follow up care.      Resources For Cancer Patients and their Caregivers ? American Cancer Society: Can assist with transportation, wigs, general needs, runs Look Good Feel Better.        914-190-5903 ? Cancer Care: Provides financial assistance, online support groups, medication/co-pay assistance.   1-800-813-HOPE 203-436-9343) ? [place] Assists [place] cancer patients and their families through emotional , educational and financial support.  (234)348-2680 ? [place] DSS Where to apply for food stamps, Medicaid and utility assistance. 506-251-1610 ? [facility]: Transportation to medical appointments. (305)771-1607 ? Social Security Administration: May apply for disability if have a Stage IV cancer. 713-186-2882 5203682853 ? [employer], Disability and Transit Services: Assists with nutrition, care and transit needs. 4500433528

## 2018-03-26 ENCOUNTER — Other Ambulatory Visit (HOSPITAL_COMMUNITY): Payer: Managed Care, Other (non HMO)

## 2018-03-26 ENCOUNTER — Ambulatory Visit (HOSPITAL_COMMUNITY): Payer: Managed Care, Other (non HMO) | Admitting: Hematology

## 2018-03-29 ENCOUNTER — Ambulatory Visit (HOSPITAL_COMMUNITY): Payer: Managed Care, Other (non HMO)

## 2018-04-05 ENCOUNTER — Other Ambulatory Visit (HOSPITAL_COMMUNITY): Payer: Managed Care, Other (non HMO)

## 2018-04-05 ENCOUNTER — Ambulatory Visit (HOSPITAL_COMMUNITY): Payer: Managed Care, Other (non HMO) | Admitting: Hematology

## 2018-04-05 ENCOUNTER — Ambulatory Visit (HOSPITAL_COMMUNITY): Payer: Managed Care, Other (non HMO)

## 2018-04-12 ENCOUNTER — Inpatient Hospital Stay (HOSPITAL_BASED_OUTPATIENT_CLINIC_OR_DEPARTMENT_OTHER): Payer: 59 | Admitting: Hematology

## 2018-04-12 ENCOUNTER — Inpatient Hospital Stay (HOSPITAL_COMMUNITY): Payer: 59

## 2018-04-12 ENCOUNTER — Encounter (HOSPITAL_COMMUNITY): Payer: Self-pay | Admitting: Hematology

## 2018-04-12 ENCOUNTER — Other Ambulatory Visit: Payer: Self-pay

## 2018-04-12 VITALS — BP 121/89 | HR 64 | Temp 98.2°F | Resp 18

## 2018-04-12 DIAGNOSIS — G620 Drug-induced polyneuropathy: Secondary | ICD-10-CM

## 2018-04-12 DIAGNOSIS — R911 Solitary pulmonary nodule: Secondary | ICD-10-CM

## 2018-04-12 DIAGNOSIS — C159 Malignant neoplasm of esophagus, unspecified: Secondary | ICD-10-CM

## 2018-04-12 DIAGNOSIS — Z5111 Encounter for antineoplastic chemotherapy: Secondary | ICD-10-CM

## 2018-04-12 DIAGNOSIS — C158 Malignant neoplasm of overlapping sites of esophagus: Secondary | ICD-10-CM

## 2018-04-12 DIAGNOSIS — Z87891 Personal history of nicotine dependence: Secondary | ICD-10-CM

## 2018-04-12 DIAGNOSIS — Z5112 Encounter for antineoplastic immunotherapy: Secondary | ICD-10-CM | POA: Diagnosis not present

## 2018-04-12 DIAGNOSIS — C155 Malignant neoplasm of lower third of esophagus: Secondary | ICD-10-CM

## 2018-04-12 DIAGNOSIS — C770 Secondary and unspecified malignant neoplasm of lymph nodes of head, face and neck: Secondary | ICD-10-CM

## 2018-04-12 LAB — URINALYSIS, DIPSTICK ONLY
Bilirubin Urine: NEGATIVE
Glucose, UA: NEGATIVE mg/dL
Hgb urine dipstick: NEGATIVE
Ketones, ur: NEGATIVE mg/dL
LEUKOCYTES UA: NEGATIVE
NITRITE: NEGATIVE
PROTEIN: 30 mg/dL — AB
SPECIFIC GRAVITY, URINE: 1.027 (ref 1.005–1.030)
pH: 5 (ref 5.0–8.0)

## 2018-04-12 LAB — COMPREHENSIVE METABOLIC PANEL
ALT: 17 U/L (ref 0–44)
ANION GAP: 7 (ref 5–15)
AST: 21 U/L (ref 15–41)
Albumin: 4.1 g/dL (ref 3.5–5.0)
Alkaline Phosphatase: 46 U/L (ref 38–126)
BILIRUBIN TOTAL: 0.8 mg/dL (ref 0.3–1.2)
BUN: 13 mg/dL (ref 6–20)
CHLORIDE: 106 mmol/L (ref 98–111)
CO2: 27 mmol/L (ref 22–32)
Calcium: 8.9 mg/dL (ref 8.9–10.3)
Creatinine, Ser: 0.99 mg/dL (ref 0.61–1.24)
Glucose, Bld: 96 mg/dL (ref 70–99)
POTASSIUM: 3.2 mmol/L — AB (ref 3.5–5.1)
Sodium: 140 mmol/L (ref 135–145)
TOTAL PROTEIN: 7.5 g/dL (ref 6.5–8.1)

## 2018-04-12 LAB — CBC WITH DIFFERENTIAL/PLATELET
Basophils Absolute: 0 10*3/uL (ref 0.0–0.1)
Basophils Relative: 1 %
EOS PCT: 4 %
Eosinophils Absolute: 0.2 10*3/uL (ref 0.0–0.7)
HEMATOCRIT: 40.5 % (ref 39.0–52.0)
Hemoglobin: 13.8 g/dL (ref 13.0–17.0)
LYMPHS PCT: 43 %
Lymphs Abs: 2.5 10*3/uL (ref 0.7–4.0)
MCH: 26.2 pg (ref 26.0–34.0)
MCHC: 34.1 g/dL (ref 30.0–36.0)
MCV: 76.9 fL — AB (ref 78.0–100.0)
MONOS PCT: 6 %
Monocytes Absolute: 0.3 10*3/uL (ref 0.1–1.0)
NEUTROS ABS: 2.7 10*3/uL (ref 1.7–7.7)
Neutrophils Relative %: 46 %
PLATELETS: 130 10*3/uL — AB (ref 150–400)
RBC: 5.27 MIL/uL (ref 4.22–5.81)
RDW: 14.9 % (ref 11.5–15.5)
WBC: 5.7 10*3/uL (ref 4.0–10.5)

## 2018-04-12 MED ORDER — SODIUM CHLORIDE 0.9 % IV SOLN
Freq: Once | INTRAVENOUS | Status: AC
  Administered 2018-04-12: 10:00:00 via INTRAVENOUS

## 2018-04-12 MED ORDER — SODIUM CHLORIDE 0.9 % IV SOLN
8.0000 mg/kg | Freq: Once | INTRAVENOUS | Status: AC
  Administered 2018-04-12: 800 mg via INTRAVENOUS
  Filled 2018-04-12: qty 50

## 2018-04-12 MED ORDER — DIPHENHYDRAMINE HCL 50 MG/ML IJ SOLN
50.0000 mg | Freq: Once | INTRAMUSCULAR | Status: AC
  Administered 2018-04-12: 50 mg via INTRAVENOUS

## 2018-04-12 MED ORDER — HEPARIN SOD (PORK) LOCK FLUSH 100 UNIT/ML IV SOLN
500.0000 [IU] | Freq: Once | INTRAVENOUS | Status: AC | PRN
  Administered 2018-04-12: 500 [IU]

## 2018-04-12 MED ORDER — DIPHENHYDRAMINE HCL 50 MG/ML IJ SOLN
INTRAMUSCULAR | Status: AC
  Filled 2018-04-12: qty 1

## 2018-04-12 NOTE — Progress Notes (Signed)
Tolerated infusion w/o adverse reaction.  Alert, in no distress.  VSS.  Discharged ambulatory.  

## 2018-04-12 NOTE — Progress Notes (Signed)
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 Barney Drain. Partially obstructing, likely malignant esophageal tumor was found in the distal esophagus. Biopsied with surgical path demonstrating Adenocarcinoma. Injected. Treated with argon plasma coagulation (APC) #3. 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 Biopsied with surgical path demonstrating Adenocarcinoma.    01/19/2017 PET scan    NECK Two FDG avid nodes are seen in the base of the neck on the right. The first is seen on CT image 35, just posterior to the right thyroid lobe and the other is seen on CT image 41 measuring 16 mm in short axis with a maximum SUV of 8.6.  CHEST  The patient's known malignancy involving the distal esophagus and cardia of the stomach is FDG avid with a maximum SUV of 10.4. No definitive metastatic nodes within the chest.  ABDOMEN/PELVIS  FDG avid metastatic adenopathy is seen in the gastrohepatic ligament, paraceliac nodes, posterior to the IVC on image 111, and to the left of the SMA on image 112. The most inferior node is seen in the aortocaval region on image 117. The representative node to the left of the SMA was measured on series 4, image 111 measuring 2.5 by 2.1 cm with a maximum SUV of 7. No other FDG avid disease in the abdomen or pelvis. Cholelithiasis is identified.    02/02/2017 Procedure    US guided bx of right supraclavicular LN by IR.    02/05/2017 Pathology Results    Lymph node, needle/core biopsy, Right Supraclavicular -  METASTATIC POORLY DIFFERENTIATED CARCINOMA.    02/10/2017 Pathology Results    HER2 - NEGATIVE    02/13/2017 Procedure    Port placed by IR    02/15/2017 - 04/25/2017 Chemotherapy    FOLFOX x 6 cycles     05/02/2017 Imaging    CT C/A/P: IMPRESSION: 1. Nodular thickening and hyperenhancement in the distal esophagus and gastric cardia could reflect residual tumor. 2. No significant change in size of previously demonstrated hypermetabolic upper abdominal lymph nodes. No progressive adenopathy identified. No residual enlarged supraclavicular nodes are seen. 3. No evidence of progressive metastatic disease. No worrisome hepatic findings. 4. Tiny right upper lobe pulmonary nodule, not clearly seen on low dose previous PET-CT. Attention on follow-up recommended. 5. Cholelithiasis.      CANCER STAGING: Cancer Staging Primary esophageal adenocarcinoma [facility]) Staging form: Esophagus - Adenocarcinoma, AJCC 8th Edition - Clinical stage from 02/11/2017: Stage IVA (cTX, cN3, cM0) - Signed by Baird Cancer, PA-C on 02/11/2017    INTERVAL HISTORY:  Maxwell Henderson 52 y.o. male returns for routine follow-up for GE junction adenocarcinoma and consideration for next cycle of chemotherapy. Patient is here today with few few side effects from his treatment. He has numbness and tingling in his feet. The intensity comes and goes. Otherwise he feels good and is ready for treatment today. Patients appetite and energy level is 75% and he is maintaining his weight. Patient denies any nausea, vomiting, or diarrhea. Denies headaches. Denies any fever or recent infections.     REVIEW OF  SYSTEMS:  Review of Systems  Neurological: Positive for numbness (feet).  All other systems reviewed and are negative.    PAST MEDICAL/SURGICAL HISTORY:  Past Medical History:  Diagnosis Date  . Hypertension   . 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 ESOPHAGOGASTRODUODENOSCOPY (EGD) WITH PROPOFOL N/A 01/16/2017   Procedure: ESOPHAGOGASTRODUODENOSCOPY (EGD) WITH PROPOFOL;  Surgeon: [name], MD;  Location: [facility];  Service: Endoscopy;  Laterality: N/A;  730   . IR FLUORO GUIDE PORT INSERTION RIGHT  02/13/2017  . IR US GUIDE VASC ACCESS RIGHT  02/13/2017  . lipoma removal    . PORTA CATH INSERTION Right 02/13/2017  . SAVORY DILATION N/A 01/16/2017   Procedure: SAVORY DILATION;  Surgeon: [name], MD;  Location: [facility];  Service: Endoscopy;  Laterality: N/A;     SOCIAL HISTORY:  Social History   Socioeconomic History  . Marital status: Married    Spouse name: Not on file  . Number of children: Not on file  . Years of education: Not on file  . Highest education level: Not on file  Occupational History  . Not on file  Social Needs  . Financial resource strain: Not on file  . Food insecurity:    Worry: Not on file    Inability: Not on file  . Transportation needs:    Medical: Not on file    Non-medical: Not on file  Tobacco Use  . Smoking status: Former Smoker    Packs/day: 0.25    Types: Cigarettes  . Smokeless tobacco: Never Used  Substance and Sexual Activity  . Alcohol use: No    Comment: hx heavy alcohol, hasn't drank in 19 yrs  . Drug use: Yes    Types: Marijuana    Comment: history of marijuana   . Sexual activity: Yes  Lifestyle  . Physical activity:    Days per week: Not on file    Minutes per session: Not on file  . Stress: Not on file  Relationships  . Social connections:    Talks on phone: Not on file    Gets together: Not on file    Attends religious service: Not on file    Active member of club or organization: Not on file    Attends meetings of clubs or organizations: Not on file    Relationship status: Not on file  . Intimate partner violence:    Fear of current or ex partner: Not on file    Emotionally abused: Not on  file    Physically abused: Not on file    Forced sexual activity: Not on file  Other Topics Concern  . Not on file  Social History Narrative   WORKING [employer].    FAMILY HISTORY:  Family History  Problem Relation Age of Onset  . Prostate cancer Father   . Colon cancer Neg Hx   . Colon polyps Neg Hx     CURRENT MEDICATIONS:  Outpatient Encounter Medications as of 04/12/2018  Medication Sig  . acetaminophen (TYLENOL) 325 MG tablet Take 2 tablets (650 mg total) by mouth every 4 (four) hours as needed for headache.  . Alpha-Lipoic Acid 600 MG CAPS Take 1 capsule (600 mg total) by mouth daily.  Marland Kitchen amLODipine (NORVASC) 10 MG tablet Take 1 tablet (10 mg total) by mouth daily.  Marland Kitchen B  Complex-C (B-COMPLEX WITH VITAMIN C) tablet Take 1 tablet by mouth daily.  Marland Kitchen gabapentin (NEURONTIN) 300 MG capsule Take 1 capsule (300 mg total) by mouth 2 (two) times daily.  Marland Kitchen lidocaine-prilocaine (EMLA) cream Apply 1 application topically as needed.  . loratadine (CLARITIN) 10 MG tablet Take 1 tablet (10 mg total) by mouth daily.  . Misc. Devices MISC Please provide the patient with the [facility] HPB cough Syrup. 1 teaspoonful every 4-6 hours PRN. Please provide the patient with 360 ml.  . Morphine Sulfate (MORPHINE CONCENTRATE) 10 mg / 0.5 ml concentrated solution Take 0.5 mLs (10 mg total) by mouth every 4 (four) hours as needed for severe pain.  . pantoprazole (PROTONIX) 40 MG tablet Take 1 tablet (40 mg total) by mouth 2 (two) times daily. Take 30 minutes before breakfast  . prochlorperazine (COMPAZINE) 10 MG tablet TAKE 1 TABLET BY MOUTH EVERY 6 HOURS AS NEEDED FOR NAUSEA AND VOMITING  . Ramucirumab (CYRAMZA IV) Inject into the vein every 14 (fourteen) days. Fridays of every other week  . ranitidine (ZANTAC) 300 MG tablet Take 1 tablet (300 mg total) by mouth at bedtime.   No facility-administered encounter medications on file as of 04/12/2018.     ALLERGIES:  No Known  Allergies   PHYSICAL EXAM:  ECOG Performance status: 1  Vitals:   04/12/18 0915  BP: 122/88  Pulse: 70  Resp: 18  Temp: 98.2 F (36.8 C)  SpO2: 100%   Filed Weights   04/12/18 0915  Weight: 212 lb 8 oz (96.4 kg)    Physical Exam  Constitutional: He is oriented to person, place, and time. He appears well-developed and well-nourished.  Cardiovascular: Normal rate, regular rhythm and normal heart sounds.  Pulmonary/Chest: Effort normal and breath sounds normal.  Neurological: He is alert and oriented to person, place, and time.  Skin: Skin is warm and dry.  Psychiatric: He has a normal mood and affect. His behavior is normal. Judgment and thought content normal.     LABORATORY DATA:  I have reviewed the labs as listed.  CBC    Component Value Date/Time   WBC 5.7 04/12/2018 0842   RBC 5.27 04/12/2018 0842   HGB 13.8 04/12/2018 0842   HCT 40.5 04/12/2018 0842   PLT 130 (L) 04/12/2018 0842   MCV 76.9 (L) 04/12/2018 0842   MCH 26.2 04/12/2018 0842   MCHC 34.1 04/12/2018 0842   RDW 14.9 04/12/2018 0842   LYMPHSABS 2.5 04/12/2018 0842   MONOABS 0.3 04/12/2018 0842   EOSABS 0.2 04/12/2018 0842   BASOSABS 0.0 04/12/2018 0842   CMP Latest Ref Rng & Units 04/12/2018 03/21/2018 03/18/2018  Glucose 70 - 99 mg/dL 96 71 111(H)  BUN 6 - 20 mg/dL _0 Creatinine 0.61 - 1.24 mg/dL 0.99 1.45(H) 0.87  Sodium 135 - 145 mmol/L 140 138 138  Potassium 3.5 - 5.1 mmol/L 3.2(L) 3.8 4.3  Chloride 98 - 111 mmol/L 106 104 106  CO2 22 - 32 mmol/L _1 Calcium 8.9 - 10.3 mg/dL 8.9 9.4 8.3(L)  Total Protein 6.5 - 8.1 g/dL 7.5 7.9 7.3  Total Bilirubin 0.3 - 1.2 mg/dL 0.8 0.8 0.6  Alkaline Phos 38 - 126 U/L 46 49 49  AST 15 - 41 U/L _2 ALT 0 - 44 U/L _3 DIAGNOSTIC IMAGING:  I have reviewed images of the CT scan dated 03/22/2018 and discussed with patient.  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  Stage IV GE junction  adenocarcinoma: - 6 cycles of FOLFOX from 02/15/2017 through 04/25/2017 -Paclitaxel and Cyramza from 05/18/2017, paclitaxel discontinued on 09/21/2017 secondary to neuropathy - Foundation 1 CDX showing MS-stable, TMB 3Muts/mb - CT scan on 11/28/2017 showing 4 mm right lower lobe nodule which is new, with stable disease elsewhere. - He was admitted to the hospital from 12/10/2017 through 12/12/2017 with pneumonia from parainfluenza 3. -Last Cyramza was given on 03/22/2018.  His renal function normalized today. -I discussed the results of the CT scan dated 03/22/2018 which showed resolution of right supraclavicular adenopathy and mediastinal adenopathy.  Residual soft tissue thickening involving the distal GE junction but no obvious mass.  Largely resolved upper abdominal adenopathy.  There is a persistent partially necrotic celiac axis lymph node.  No pulmonary metastatic disease. - He is tolerating it very well.  His creatinine has normalized.  He may proceed with Cyramza today.  He will be seen back in 6 weeks for follow-up.  2.  Peripheral neuropathy: He has bilateral toe numbness which hurts at times.  Taxol was discontinued on 09/21/2017.  He is taking gabapentin 3 times a day.        Orders placed this encounter:  Orders Placed This Encounter  Procedures  . CBC with Differential/Platelet  . Comprehensive metabolic panel      Derek Jack, MD [place] [phone]

## 2018-04-12 NOTE — Assessment & Plan Note (Addendum)
1.  Stage IV GE junction adenocarcinoma: - 6 cycles of FOLFOX from 02/15/2017 through 04/25/2017 -Paclitaxel and Cyramza from 05/18/2017, paclitaxel discontinued on 09/21/2017 secondary to neuropathy - Foundation 1 CDX showing MS-stable, TMB 3Muts/mb - CT scan on 11/28/2017 showing 4 mm right lower lobe nodule which is new, with stable disease elsewhere. - He was admitted to the hospital from 12/10/2017 through 12/12/2017 with pneumonia from parainfluenza 3. -Last Cyramza was given on 03/22/2018.  His renal function normalized today. -I discussed the results of the CT scan dated 03/22/2018 which showed resolution of right supraclavicular adenopathy and mediastinal adenopathy.  Residual soft tissue thickening involving the distal GE junction but no obvious mass.  Largely resolved upper abdominal adenopathy.  There is a persistent partially necrotic celiac axis lymph node.  No pulmonary metastatic disease. - He is tolerating it very well.  His creatinine has normalized.  He may proceed with Cyramza today.  He will be seen back in 6 weeks for follow-up.  2.  Peripheral neuropathy: He has bilateral toe numbness which hurts at times.  Taxol was discontinued on 09/21/2017.  He is taking gabapentin 3 times a day.

## 2018-04-12 NOTE — Patient Instructions (Signed)
[place] at [facility] Discharge Instructions  Follow up in 6 weeks with labs. Continue on your normal treatment cycle with labs.    Thank you for choosing [place] at [facility] to provide your oncology and hematology care.  To afford each patient quality time with our provider, please arrive at least 15 minutes before your scheduled appointment time.   If you have a lab appointment with the [facility] please come in thru the  Main Entrance and check in at the main information desk  You need to re-schedule your appointment should you arrive 10 or more minutes late.  We strive to give you quality time with our providers, and arriving late affects you and other patients whose appointments are after yours.  Also, if you no show three or more times for appointments you may be dismissed from the clinic at the providers discretion.     Again, thank you for choosing [facility].  Our hope is that these requests will decrease the amount of time that you wait before being seen by our physicians.       _____________________________________________________________  Should you have questions after your visit to [facility], please contact our office at [phone] between the hours of 8:00 a.m. and 4:30 p.m.  Voicemails left after 4:00 p.m. will not be returned until the following business day.  For prescription refill requests, have your pharmacy contact our office and allow 72 hours.    Cancer Center Support Programs:   > Cancer Support Group  2nd Tuesday of the month 1pm-2pm, Journey Room

## 2018-04-24 ENCOUNTER — Other Ambulatory Visit (HOSPITAL_COMMUNITY): Payer: Self-pay

## 2018-04-24 DIAGNOSIS — C159 Malignant neoplasm of esophagus, unspecified: Secondary | ICD-10-CM

## 2018-04-26 ENCOUNTER — Inpatient Hospital Stay (HOSPITAL_COMMUNITY): Payer: Managed Care, Other (non HMO) | Attending: Hematology

## 2018-04-26 ENCOUNTER — Encounter (HOSPITAL_COMMUNITY): Payer: Self-pay

## 2018-04-26 ENCOUNTER — Inpatient Hospital Stay (HOSPITAL_COMMUNITY): Payer: Managed Care, Other (non HMO)

## 2018-04-26 VITALS — BP 126/88 | HR 67 | Temp 98.1°F | Resp 18

## 2018-04-26 DIAGNOSIS — Z5112 Encounter for antineoplastic immunotherapy: Secondary | ICD-10-CM | POA: Diagnosis present

## 2018-04-26 DIAGNOSIS — C158 Malignant neoplasm of overlapping sites of esophagus: Secondary | ICD-10-CM

## 2018-04-26 DIAGNOSIS — C155 Malignant neoplasm of lower third of esophagus: Secondary | ICD-10-CM | POA: Insufficient documentation

## 2018-04-26 DIAGNOSIS — C159 Malignant neoplasm of esophagus, unspecified: Secondary | ICD-10-CM

## 2018-04-26 DIAGNOSIS — Z23 Encounter for immunization: Secondary | ICD-10-CM | POA: Diagnosis not present

## 2018-04-26 DIAGNOSIS — C770 Secondary and unspecified malignant neoplasm of lymph nodes of head, face and neck: Secondary | ICD-10-CM | POA: Insufficient documentation

## 2018-04-26 DIAGNOSIS — Z5111 Encounter for antineoplastic chemotherapy: Secondary | ICD-10-CM

## 2018-04-26 LAB — COMPREHENSIVE METABOLIC PANEL
ALK PHOS: 51 U/L (ref 38–126)
ALT: 17 U/L (ref 0–44)
AST: 21 U/L (ref 15–41)
Albumin: 4.2 g/dL (ref 3.5–5.0)
Anion gap: 8 (ref 5–15)
BILIRUBIN TOTAL: 0.7 mg/dL (ref 0.3–1.2)
BUN: 11 mg/dL (ref 6–20)
CALCIUM: 9.2 mg/dL (ref 8.9–10.3)
CO2: 28 mmol/L (ref 22–32)
CREATININE: 0.94 mg/dL (ref 0.61–1.24)
Chloride: 104 mmol/L (ref 98–111)
Glucose, Bld: 117 mg/dL — ABNORMAL HIGH (ref 70–99)
Potassium: 4.2 mmol/L (ref 3.5–5.1)
Sodium: 140 mmol/L (ref 135–145)
TOTAL PROTEIN: 7.6 g/dL (ref 6.5–8.1)

## 2018-04-26 LAB — CBC WITH DIFFERENTIAL/PLATELET
BASOS ABS: 0 10*3/uL (ref 0.0–0.1)
Basophils Relative: 0 %
EOS PCT: 4 %
Eosinophils Absolute: 0.2 10*3/uL (ref 0.0–0.7)
HEMATOCRIT: 40.7 % (ref 39.0–52.0)
Hemoglobin: 13.9 g/dL (ref 13.0–17.0)
LYMPHS PCT: 39 %
Lymphs Abs: 1.8 10*3/uL (ref 0.7–4.0)
MCH: 25.9 pg — AB (ref 26.0–34.0)
MCHC: 34.2 g/dL (ref 30.0–36.0)
MCV: 75.9 fL — ABNORMAL LOW (ref 78.0–100.0)
MONOS PCT: 7 %
Monocytes Absolute: 0.3 10*3/uL (ref 0.1–1.0)
NEUTROS PCT: 50 %
Neutro Abs: 2.3 10*3/uL (ref 1.7–7.7)
Platelets: 174 10*3/uL (ref 150–400)
RBC: 5.36 MIL/uL (ref 4.22–5.81)
RDW: 14.8 % (ref 11.5–15.5)
WBC: 4.6 10*3/uL (ref 4.0–10.5)

## 2018-04-26 LAB — URINALYSIS, DIPSTICK ONLY
BILIRUBIN URINE: NEGATIVE
Glucose, UA: NEGATIVE mg/dL
HGB URINE DIPSTICK: NEGATIVE
KETONES UR: NEGATIVE mg/dL
NITRITE: NEGATIVE
PROTEIN: 30 mg/dL — AB
Specific Gravity, Urine: 1.03 (ref 1.005–1.030)
pH: 5 (ref 5.0–8.0)

## 2018-04-26 MED ORDER — SODIUM CHLORIDE 0.9 % IV SOLN
8.0000 mg/kg | Freq: Once | INTRAVENOUS | Status: AC
  Administered 2018-04-26: 800 mg via INTRAVENOUS
  Filled 2018-04-26: qty 30

## 2018-04-26 MED ORDER — SODIUM CHLORIDE 0.9 % IV SOLN
Freq: Once | INTRAVENOUS | Status: AC
  Administered 2018-04-26: 10:00:00 via INTRAVENOUS

## 2018-04-26 MED ORDER — DIPHENHYDRAMINE HCL 50 MG/ML IJ SOLN
INTRAMUSCULAR | Status: AC
  Filled 2018-04-26: qty 1

## 2018-04-26 MED ORDER — SODIUM CHLORIDE 0.9% FLUSH
10.0000 mL | INTRAVENOUS | Status: DC | PRN
  Administered 2018-04-26: 10 mL
  Filled 2018-04-26: qty 10

## 2018-04-26 MED ORDER — DIPHENHYDRAMINE HCL 50 MG/ML IJ SOLN
50.0000 mg | Freq: Once | INTRAMUSCULAR | Status: AC
  Administered 2018-04-26: 50 mg via INTRAVENOUS

## 2018-04-26 MED ORDER — HEPARIN SOD (PORK) LOCK FLUSH 100 UNIT/ML IV SOLN
500.0000 [IU] | Freq: Once | INTRAVENOUS | Status: AC | PRN
  Administered 2018-04-26: 500 [IU]

## 2018-04-26 NOTE — Patient Instructions (Signed)
[facility] Discharge Instructions for Patients Receiving Chemotherapy  Today you received the following chemotherapy agents cyramza.    If you develop nausea and vomiting that is not controlled by your nausea medication, call the clinic.   BELOW ARE SYMPTOMS THAT SHOULD BE REPORTED IMMEDIATELY:  *FEVER GREATER THAN 100.5 F  *CHILLS WITH OR WITHOUT FEVER  NAUSEA AND VOMITING THAT IS NOT CONTROLLED WITH YOUR NAUSEA MEDICATION  *UNUSUAL SHORTNESS OF BREATH  *UNUSUAL BRUISING OR BLEEDING  TENDERNESS IN MOUTH AND THROAT WITH OR WITHOUT PRESENCE OF ULCERS  *URINARY PROBLEMS  *BOWEL PROBLEMS  UNUSUAL RASH Items with * indicate a potential emergency and should be followed up as soon as possible.  Feel free to call the clinic should you have any questions or concerns. The clinic phone number is [phone].  Please show the CHEMO ALERT CARD at check-in to the Emergency Department and triage nurse.   

## 2018-04-26 NOTE — Progress Notes (Signed)
Patient tolerated chemotherapy with no complaints voiced.  Port site clean and dry with no bruising or swelling noted at site.  Good blood return noted before and after administration of therapy. Band aid applied.  VSs with discharge and left ambulatory with no s/s of distress noted.

## 2018-05-10 ENCOUNTER — Encounter (HOSPITAL_COMMUNITY): Payer: Self-pay

## 2018-05-10 ENCOUNTER — Inpatient Hospital Stay (HOSPITAL_COMMUNITY): Payer: Managed Care, Other (non HMO)

## 2018-05-10 ENCOUNTER — Other Ambulatory Visit: Payer: Self-pay

## 2018-05-10 DIAGNOSIS — Z5111 Encounter for antineoplastic chemotherapy: Secondary | ICD-10-CM

## 2018-05-10 DIAGNOSIS — Z5112 Encounter for antineoplastic immunotherapy: Secondary | ICD-10-CM | POA: Diagnosis not present

## 2018-05-10 DIAGNOSIS — C159 Malignant neoplasm of esophagus, unspecified: Secondary | ICD-10-CM

## 2018-05-10 DIAGNOSIS — C158 Malignant neoplasm of overlapping sites of esophagus: Secondary | ICD-10-CM

## 2018-05-10 LAB — COMPREHENSIVE METABOLIC PANEL
ALK PHOS: 44 U/L (ref 38–126)
ALT: 18 U/L (ref 0–44)
AST: 23 U/L (ref 15–41)
Albumin: 4.7 g/dL (ref 3.5–5.0)
Anion gap: 7 (ref 5–15)
BUN: 14 mg/dL (ref 6–20)
CALCIUM: 9.2 mg/dL (ref 8.9–10.3)
CHLORIDE: 106 mmol/L (ref 98–111)
CO2: 26 mmol/L (ref 22–32)
CREATININE: 1 mg/dL (ref 0.61–1.24)
GFR calc Af Amer: >60 mL/min (ref >60)
GFR calc non Af Amer: >60 mL/min (ref >60)
GLUCOSE: 114 mg/dL — AB (ref 70–99)
Potassium: 3.4 mmol/L — ABNORMAL LOW (ref 3.5–5.1)
SODIUM: 139 mmol/L (ref 135–145)
Total Bilirubin: 1 mg/dL (ref 0.3–1.2)
Total Protein: 8.3 g/dL — ABNORMAL HIGH (ref 6.5–8.1)

## 2018-05-10 LAB — URINALYSIS, DIPSTICK ONLY
BILIRUBIN URINE: NEGATIVE
GLUCOSE, UA: NEGATIVE mg/dL
Hgb urine dipstick: NEGATIVE
Ketones, ur: NEGATIVE mg/dL
Leukocytes, UA: NEGATIVE
NITRITE: NEGATIVE
PH: 5 (ref 5.0–8.0)
Protein, ur: >=300 mg/dL — AB
Specific Gravity, Urine: 1.028 (ref 1.005–1.030)

## 2018-05-10 LAB — CBC WITH DIFFERENTIAL/PLATELET
BASOS ABS: 0 10*3/uL (ref 0.0–0.1)
Basophils Relative: 1 %
EOS ABS: 0.2 10*3/uL (ref 0.0–0.7)
EOS PCT: 4 %
HCT: 42.6 % (ref 39.0–52.0)
HEMOGLOBIN: 15 g/dL (ref 13.0–17.0)
LYMPHS ABS: 2.6 10*3/uL (ref 0.7–4.0)
Lymphocytes Relative: 43 %
MCH: 27 pg (ref 26.0–34.0)
MCHC: 35.2 g/dL (ref 30.0–36.0)
MCV: 76.6 fL — ABNORMAL LOW (ref 78.0–100.0)
Monocytes Absolute: 0.4 10*3/uL (ref 0.1–1.0)
Monocytes Relative: 6 %
NEUTROS PCT: 46 %
Neutro Abs: 2.8 10*3/uL (ref 1.7–7.7)
PLATELETS: 196 10*3/uL (ref 150–400)
RBC: 5.56 MIL/uL (ref 4.22–5.81)
RDW: 14.5 % (ref 11.5–15.5)
WBC: 6.1 10*3/uL (ref 4.0–10.5)

## 2018-05-10 LAB — TSH: TSH: 1.171 u[IU]/mL (ref 0.350–4.500)

## 2018-05-10 MED ORDER — INFLUENZA VAC SPLIT QUAD 0.5 ML IM SUSY
0.5000 mL | PREFILLED_SYRINGE | Freq: Once | INTRAMUSCULAR | Status: AC
  Administered 2018-05-10: 0.5 mL via INTRAMUSCULAR
  Filled 2018-05-10: qty 0.5

## 2018-05-10 NOTE — Progress Notes (Signed)
Hold tx today per Dr. Delton Coombes for urine protein > 300.  Maxwell Henderson presents today for injection per the provider's orders.  Influenza vaccine administration without incident; see MAR for injection details.  Patient tolerated procedure well and without incident.  No questions or complaints noted at this time.  Discharged ambulatory.

## 2018-05-17 ENCOUNTER — Inpatient Hospital Stay (HOSPITAL_COMMUNITY): Payer: Medicaid Other

## 2018-05-17 ENCOUNTER — Inpatient Hospital Stay (HOSPITAL_COMMUNITY): Payer: Medicaid Other | Attending: Hematology

## 2018-05-17 ENCOUNTER — Encounter (HOSPITAL_COMMUNITY): Payer: Self-pay

## 2018-05-17 ENCOUNTER — Ambulatory Visit (HOSPITAL_COMMUNITY): Payer: Managed Care, Other (non HMO)

## 2018-05-17 VITALS — BP 132/71 | HR 71 | Temp 97.6°F | Resp 18 | Wt 209.0 lb

## 2018-05-17 DIAGNOSIS — Z79899 Other long term (current) drug therapy: Secondary | ICD-10-CM | POA: Insufficient documentation

## 2018-05-17 DIAGNOSIS — Z5112 Encounter for antineoplastic immunotherapy: Secondary | ICD-10-CM | POA: Insufficient documentation

## 2018-05-17 DIAGNOSIS — C155 Malignant neoplasm of lower third of esophagus: Secondary | ICD-10-CM | POA: Diagnosis present

## 2018-05-17 DIAGNOSIS — Z5111 Encounter for antineoplastic chemotherapy: Secondary | ICD-10-CM

## 2018-05-17 DIAGNOSIS — C770 Secondary and unspecified malignant neoplasm of lymph nodes of head, face and neck: Secondary | ICD-10-CM | POA: Diagnosis present

## 2018-05-17 DIAGNOSIS — C159 Malignant neoplasm of esophagus, unspecified: Secondary | ICD-10-CM

## 2018-05-17 DIAGNOSIS — C158 Malignant neoplasm of overlapping sites of esophagus: Secondary | ICD-10-CM

## 2018-05-17 LAB — COMPREHENSIVE METABOLIC PANEL
ALT: 16 U/L (ref 0–44)
AST: 18 U/L (ref 15–41)
Albumin: 4.4 g/dL (ref 3.5–5.0)
Alkaline Phosphatase: 44 U/L (ref 38–126)
Anion gap: 8 (ref 5–15)
BUN: 8 mg/dL (ref 6–20)
CHLORIDE: 105 mmol/L (ref 98–111)
CO2: 27 mmol/L (ref 22–32)
CREATININE: 0.97 mg/dL (ref 0.61–1.24)
Calcium: 9 mg/dL (ref 8.9–10.3)
GFR calc Af Amer: >60 mL/min (ref >60)
GFR calc non Af Amer: >60 mL/min (ref >60)
Glucose, Bld: 116 mg/dL — ABNORMAL HIGH (ref 70–99)
Potassium: 3.6 mmol/L (ref 3.5–5.1)
SODIUM: 140 mmol/L (ref 135–145)
Total Bilirubin: 0.7 mg/dL (ref 0.3–1.2)
Total Protein: 7.7 g/dL (ref 6.5–8.1)

## 2018-05-17 LAB — CBC WITH DIFFERENTIAL/PLATELET
BASOS PCT: 1 %
Basophils Absolute: 0 10*3/uL (ref 0.0–0.1)
EOS ABS: 0.3 10*3/uL (ref 0.0–0.7)
EOS PCT: 4 %
HCT: 42.3 % (ref 39.0–52.0)
Hemoglobin: 14.7 g/dL (ref 13.0–17.0)
Lymphocytes Relative: 42 %
Lymphs Abs: 2.7 10*3/uL (ref 0.7–4.0)
MCH: 26.4 pg (ref 26.0–34.0)
MCHC: 34.8 g/dL (ref 30.0–36.0)
MCV: 76.1 fL — AB (ref 78.0–100.0)
Monocytes Absolute: 0.4 10*3/uL (ref 0.1–1.0)
Monocytes Relative: 6 %
NEUTROS ABS: 3 10*3/uL (ref 1.7–7.7)
Neutrophils Relative %: 47 %
PLATELETS: 193 10*3/uL (ref 150–400)
RBC: 5.56 MIL/uL (ref 4.22–5.81)
RDW: 14.7 % (ref 11.5–15.5)
WBC: 6.3 10*3/uL (ref 4.0–10.5)

## 2018-05-17 LAB — URINALYSIS, DIPSTICK ONLY
Bilirubin Urine: NEGATIVE
GLUCOSE, UA: NEGATIVE mg/dL
HGB URINE DIPSTICK: NEGATIVE
Ketones, ur: NEGATIVE mg/dL
Leukocytes, UA: NEGATIVE
Nitrite: NEGATIVE
PH: 5 (ref 5.0–8.0)
Protein, ur: NEGATIVE mg/dL
SPECIFIC GRAVITY, URINE: 1.02 (ref 1.005–1.030)

## 2018-05-17 MED ORDER — SODIUM CHLORIDE 0.9 % IV SOLN
8.0000 mg/kg | Freq: Once | INTRAVENOUS | Status: AC
  Administered 2018-05-17: 800 mg via INTRAVENOUS
  Filled 2018-05-17: qty 50

## 2018-05-17 MED ORDER — HEPARIN SOD (PORK) LOCK FLUSH 100 UNIT/ML IV SOLN
500.0000 [IU] | Freq: Once | INTRAVENOUS | Status: AC | PRN
  Administered 2018-05-17: 500 [IU]
  Filled 2018-05-17: qty 5

## 2018-05-17 MED ORDER — DIPHENHYDRAMINE HCL 50 MG/ML IJ SOLN
50.0000 mg | Freq: Once | INTRAMUSCULAR | Status: AC
  Administered 2018-05-17: 50 mg via INTRAVENOUS
  Filled 2018-05-17: qty 1

## 2018-05-17 MED ORDER — SODIUM CHLORIDE 0.9% FLUSH
10.0000 mL | INTRAVENOUS | Status: DC | PRN
  Administered 2018-05-17: 10 mL
  Filled 2018-05-17: qty 10

## 2018-05-17 MED ORDER — SODIUM CHLORIDE 0.9 % IV SOLN
Freq: Once | INTRAVENOUS | Status: AC
  Administered 2018-05-17: 10:00:00 via INTRAVENOUS

## 2018-05-17 NOTE — Patient Instructions (Signed)
[facility] Discharge Instructions for Patients Receiving Chemotherapy  Today you received the following chemotherapy agents cyramza.    If you develop nausea and vomiting that is not controlled by your nausea medication, call the clinic.   BELOW ARE SYMPTOMS THAT SHOULD BE REPORTED IMMEDIATELY:  *FEVER GREATER THAN 100.5 F  *CHILLS WITH OR WITHOUT FEVER  NAUSEA AND VOMITING THAT IS NOT CONTROLLED WITH YOUR NAUSEA MEDICATION  *UNUSUAL SHORTNESS OF BREATH  *UNUSUAL BRUISING OR BLEEDING  TENDERNESS IN MOUTH AND THROAT WITH OR WITHOUT PRESENCE OF ULCERS  *URINARY PROBLEMS  *BOWEL PROBLEMS  UNUSUAL RASH Items with * indicate a potential emergency and should be followed up as soon as possible.  Feel free to call the clinic should you have any questions or concerns. The clinic phone number is [phone].  Please show the CHEMO ALERT CARD at check-in to the Emergency Department and triage nurse.   

## 2018-05-17 NOTE — Progress Notes (Signed)
Patient tolerated therapy with no complaints voiced.  Port site clean and dry with no bruising or swelling noted at site.  Good blood return noted at site.  Band aid applied.  VSS with discharge and left ambulatory with no s/s of distress ntoed.

## 2018-05-17 NOTE — Progress Notes (Signed)
Nutrition Follow-up:  Patient with stage IV esophageal adenocarcinoma.  Patient receiving cyramza.    Met with patient today prior to infusion.  Patient reports that appetite is poor for about 1 week then improves following chemotherapy.  Reports that he still tries to eat during that week but has to force himself. Reports that he really tries to eat well the weeks he is feeling better.    Medications: reviewed  Labs: reviewed  Anthropometrics:   Weight has increased to 209 lb today increased from weight of 205 lb in July 2019 but decreased from last weight of 212 lb on 04/12/2018   NUTRITION DIAGNOSIS: Inadequate oral intake continues   INTERVENTION:  Patient interested in case of ensure enlive today this will be his first.  Reports he wants to try and drink them more on his "off" week.  Likes chocolate.  Shared ways to flavor up vanilla ensure enlive.  Also provided coupons     MONITORING, EVALUATION, GOAL: weight trends, intake   NEXT VISIT: as needed    Maxwell Henderson B. Zenia Resides, [place], [place] Registered Dietitian [phone] (pager)

## 2018-05-21 ENCOUNTER — Other Ambulatory Visit (HOSPITAL_COMMUNITY): Payer: Self-pay | Admitting: *Deleted

## 2018-05-21 MED ORDER — PANTOPRAZOLE SODIUM 40 MG PO TBEC: 40.0000 mg | DELAYED_RELEASE_TABLET | Freq: Two times a day (BID) | ORAL | 3 refills | Status: DC

## 2018-05-24 ENCOUNTER — Ambulatory Visit (HOSPITAL_COMMUNITY): Payer: Managed Care, Other (non HMO) | Admitting: Hematology

## 2018-05-24 ENCOUNTER — Other Ambulatory Visit (HOSPITAL_COMMUNITY): Payer: Managed Care, Other (non HMO)

## 2018-05-24 ENCOUNTER — Ambulatory Visit (HOSPITAL_COMMUNITY): Payer: Managed Care, Other (non HMO)

## 2018-05-28 NOTE — Telephone Encounter (Signed)
w

## 2018-05-29 ENCOUNTER — Ambulatory Visit (HOSPITAL_COMMUNITY): Payer: Managed Care, Other (non HMO) | Admitting: Oncology

## 2018-05-29 ENCOUNTER — Ambulatory Visit (HOSPITAL_COMMUNITY): Payer: Managed Care, Other (non HMO)

## 2018-05-29 ENCOUNTER — Other Ambulatory Visit (HOSPITAL_COMMUNITY): Payer: Managed Care, Other (non HMO)

## 2018-05-31 ENCOUNTER — Inpatient Hospital Stay (HOSPITAL_COMMUNITY): Payer: Medicaid Other

## 2018-05-31 ENCOUNTER — Other Ambulatory Visit: Payer: Self-pay

## 2018-05-31 ENCOUNTER — Inpatient Hospital Stay (HOSPITAL_BASED_OUTPATIENT_CLINIC_OR_DEPARTMENT_OTHER): Payer: Medicaid Other | Admitting: Internal Medicine

## 2018-05-31 ENCOUNTER — Encounter (HOSPITAL_COMMUNITY): Payer: Self-pay

## 2018-05-31 VITALS — BP 119/80 | HR 66 | Temp 98.1°F | Resp 18 | Wt 209.8 lb

## 2018-05-31 VITALS — BP 121/86 | HR 68 | Temp 97.9°F | Resp 17

## 2018-05-31 DIAGNOSIS — G620 Drug-induced polyneuropathy: Secondary | ICD-10-CM

## 2018-05-31 DIAGNOSIS — R911 Solitary pulmonary nodule: Secondary | ICD-10-CM

## 2018-05-31 DIAGNOSIS — C770 Secondary and unspecified malignant neoplasm of lymph nodes of head, face and neck: Secondary | ICD-10-CM

## 2018-05-31 DIAGNOSIS — Z5111 Encounter for antineoplastic chemotherapy: Secondary | ICD-10-CM

## 2018-05-31 DIAGNOSIS — C159 Malignant neoplasm of esophagus, unspecified: Secondary | ICD-10-CM

## 2018-05-31 DIAGNOSIS — Z87891 Personal history of nicotine dependence: Secondary | ICD-10-CM

## 2018-05-31 DIAGNOSIS — C155 Malignant neoplasm of lower third of esophagus: Secondary | ICD-10-CM

## 2018-05-31 DIAGNOSIS — Z5112 Encounter for antineoplastic immunotherapy: Secondary | ICD-10-CM | POA: Diagnosis not present

## 2018-05-31 LAB — CBC WITH DIFFERENTIAL/PLATELET
Abs Immature Granulocytes: 0.01 10*3/uL (ref 0.00–0.07)
BASOS PCT: 1 %
Basophils Absolute: 0 10*3/uL (ref 0.0–0.1)
EOS ABS: 0.3 10*3/uL (ref 0.0–0.5)
EOS PCT: 5 %
HCT: 42.5 % (ref 39.0–52.0)
Hemoglobin: 13.7 g/dL (ref 13.0–17.0)
IMMATURE GRANULOCYTES: 0 %
Lymphocytes Relative: 42 %
Lymphs Abs: 2.1 10*3/uL (ref 0.7–4.0)
MCH: 24.9 pg — ABNORMAL LOW (ref 26.0–34.0)
MCHC: 32.2 g/dL (ref 30.0–36.0)
MCV: 77.3 fL — AB (ref 80.0–100.0)
MONOS PCT: 7 %
Monocytes Absolute: 0.4 10*3/uL (ref 0.1–1.0)
NEUTROS PCT: 45 %
Neutro Abs: 2.3 10*3/uL (ref 1.7–7.7)
Platelets: 181 10*3/uL (ref 150–400)
RBC: 5.5 MIL/uL (ref 4.22–5.81)
RDW: 14.6 % (ref 11.5–15.5)
WBC: 5.1 10*3/uL (ref 4.0–10.5)
nRBC: 0 % (ref 0.0–0.2)

## 2018-05-31 LAB — COMPREHENSIVE METABOLIC PANEL
ALT: 17 U/L (ref 0–44)
AST: 19 U/L (ref 15–41)
Albumin: 4.4 g/dL (ref 3.5–5.0)
Alkaline Phosphatase: 50 U/L (ref 38–126)
Anion gap: 7 (ref 5–15)
BUN: 16 mg/dL (ref 6–20)
CHLORIDE: 105 mmol/L (ref 98–111)
CO2: 27 mmol/L (ref 22–32)
CREATININE: 1.07 mg/dL (ref 0.61–1.24)
Calcium: 9.2 mg/dL (ref 8.9–10.3)
GFR calc Af Amer: >60 mL/min (ref >60)
GFR calc non Af Amer: >60 mL/min (ref >60)
GLUCOSE: 111 mg/dL — AB (ref 70–99)
Potassium: 4 mmol/L (ref 3.5–5.1)
SODIUM: 139 mmol/L (ref 135–145)
Total Bilirubin: 0.5 mg/dL (ref 0.3–1.2)
Total Protein: 7.6 g/dL (ref 6.5–8.1)

## 2018-05-31 LAB — URINALYSIS, DIPSTICK ONLY
BILIRUBIN URINE: NEGATIVE
GLUCOSE, UA: NEGATIVE mg/dL
Hgb urine dipstick: NEGATIVE
KETONES UR: NEGATIVE mg/dL
LEUKOCYTES UA: NEGATIVE
Nitrite: NEGATIVE
Protein, ur: NEGATIVE mg/dL
Specific Gravity, Urine: 1.023 (ref 1.005–1.030)
pH: 6 (ref 5.0–8.0)

## 2018-05-31 MED ORDER — DIPHENHYDRAMINE HCL 50 MG/ML IJ SOLN
INTRAMUSCULAR | Status: AC
  Filled 2018-05-31: qty 1

## 2018-05-31 MED ORDER — DIPHENHYDRAMINE HCL 50 MG/ML IJ SOLN
50.0000 mg | Freq: Once | INTRAMUSCULAR | Status: AC
  Administered 2018-05-31: 50 mg via INTRAVENOUS

## 2018-05-31 MED ORDER — SODIUM CHLORIDE 0.9% FLUSH
10.0000 mL | INTRAVENOUS | Status: DC | PRN
  Administered 2018-05-31: 10 mL
  Filled 2018-05-31: qty 10

## 2018-05-31 MED ORDER — HEPARIN SOD (PORK) LOCK FLUSH 100 UNIT/ML IV SOLN
500.0000 [IU] | Freq: Once | INTRAVENOUS | Status: AC | PRN
  Administered 2018-05-31: 500 [IU]

## 2018-05-31 MED ORDER — SODIUM CHLORIDE 0.9 % IV SOLN
Freq: Once | INTRAVENOUS | Status: AC
  Administered 2018-05-31: 09:00:00 via INTRAVENOUS

## 2018-05-31 MED ORDER — SODIUM CHLORIDE 0.9 % IV SOLN
8.0000 mg/kg | Freq: Once | INTRAVENOUS | Status: AC
  Administered 2018-05-31: 800 mg via INTRAVENOUS
  Filled 2018-05-31: qty 50

## 2018-05-31 NOTE — Progress Notes (Signed)
Pt ambulated to room 407 with no assist. No complaints of any changes since last visit. Labs reviewed . UA wnl. No protein noted.   Treatment given today per MD orders. Tolerated infusion without adverse affects. Vital signs stable. No complaints at this time. Discharged from clinic ambulatory. F/U with [facility] as scheduled.

## 2018-05-31 NOTE — Progress Notes (Signed)
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  Stage IV GE junction adenocarcinoma: - 6 cycles of FOLFOX from 02/15/2017 through 04/25/2017 -Paclitaxel and Cyramza from 05/18/2017, paclitaxel discontinued on 09/21/2017 secondary to neuropathy - [facility] 1 CDX showing MS-stable, TMB 3Muts/mb - CT scan on 11/28/2017 showing 4 mm right lower lobe nodule which is new, with stable disease elsewhere. - He was admitted to the hospital from 12/10/2017 through 12/12/2017 with pneumonia from parainfluenza 3. -Last Cyramza was given on 03/22/2018.  His renal function normalized today. -CT scan dated 03/22/2018 which showed resolution of right supraclavicular adenopathy and mediastinal adenopathy.  Residual soft tissue thickening involving the distal GE junction but no obvious mass.  Largely resolved upper abdominal adenopathy.  There is a persistent partially necrotic celiac axis lymph node.  No pulmonary metastatic disease.  - Pt will proceed with Cyramza today.  He will follow-up with Dr. Worthy Keeler in 3 weeks.   2.  Peripheral neuropathy: He has bilateral toe numbness which hurts at times.  Taxol was discontinued on 09/21/2017.  Continue gabapentin 3 times a day.    25 minutes spent with more than 50% spent in counseling and coordination of care.    Current Status:  Pt is seen today for follow-up prior to [place].  He is doing well and denies any complaints.       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 Barney Drain. Partially obstructing, likely malignant esophageal tumor was found in the distal esophagus. Biopsied with surgical path demonstrating Adenocarcinoma. Injected. Treated with argon plasma coagulation (APC) #3. 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 Biopsied with surgical path demonstrating Adenocarcinoma.    01/19/2017 PET scan    NECK Two FDG avid nodes are seen in the base of the neck on the right. The first is seen on CT image 35, just posterior to the right thyroid lobe and the other is seen on CT image 41 measuring 16 mm in short axis with a maximum SUV of 8.6.  CHEST  The patient's known malignancy involving the distal esophagus and cardia of the stomach is FDG avid with a maximum SUV of 10.4. No definitive metastatic nodes within the chest.  ABDOMEN/PELVIS  FDG avid metastatic adenopathy is seen in the gastrohepatic ligament, paraceliac nodes, posterior to the IVC on image 111, and to the left of the SMA on image 112. The most inferior node is seen in the aortocaval region on image 117. The representative node to the left of the SMA was measured on series 4, image 111 measuring 2.5 by 2.1 cm with a maximum SUV of 7. No other FDG avid disease in the abdomen or pelvis. Cholelithiasis is identified.    02/02/2017 Procedure    US guided bx of right supraclavicular LN by IR.    02/05/2017 Pathology Results    Lymph node, needle/core biopsy, Right Supraclavicular - METASTATIC POORLY DIFFERENTIATED CARCINOMA.    02/10/2017 Pathology Results    HER2 - NEGATIVE    02/13/2017 Procedure    Port placed by IR  02/15/2017 - 04/25/2017 Chemotherapy    FOLFOX x 6 cycles     05/02/2017 Imaging    CT C/A/P: IMPRESSION: 1. Nodular thickening and hyperenhancement in the distal esophagus and gastric cardia could reflect residual tumor. 2. No significant change in size of previously demonstrated hypermetabolic upper abdominal lymph nodes. No  progressive adenopathy identified. No residual enlarged supraclavicular nodes are seen. 3. No evidence of progressive metastatic disease. No worrisome hepatic findings. 4. Tiny right upper lobe pulmonary nodule, not clearly seen on low dose previous PET-CT. Attention on follow-up recommended. 5. Cholelithiasis.      Problem List Patient Active Problem List   Diagnosis Date Noted  . Type 2 diabetes mellitus without complication, without long-term current use of insulin ([place]) [E11.9]   . Essential hypertension [I10]   . SIRS (systemic inflammatory response syndrome) (HCC) [R65.10] 12/11/2017  . HCAP (healthcare-associated pneumonia) [J18.9] 12/11/2017  . Infection due to parainfluenza virus 3 [B34.8] 12/11/2017  . PNA (pneumonia) [J18.9] 12/10/2017  . Hypokalemia [E87.6] 12/10/2017  . Iron deficiency anemia due to chronic blood loss [D50.0] 02/28/2017  . 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 Dysphagia [R13.10] 01/05/2017    Past Medical History Past Medical History:  Diagnosis Date  . Hypertension   . 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 ESOPHAGOGASTRODUODENOSCOPY (EGD) WITH PROPOFOL N/A 01/16/2017   Procedure: ESOPHAGOGASTRODUODENOSCOPY (EGD) WITH PROPOFOL;  Surgeon: [name], MD;  Location: [facility];  Service: Endoscopy;  Laterality: N/A;  730   . IR FLUORO GUIDE PORT INSERTION RIGHT  02/13/2017  . IR US GUIDE VASC ACCESS RIGHT  02/13/2017  . lipoma removal    . PORTA CATH INSERTION Right 02/13/2017  . SAVORY DILATION N/A 01/16/2017   Procedure: SAVORY DILATION;  Surgeon: [name], MD;  Location: [facility];  Service: Endoscopy;  Laterality: N/A;    Family History Family History  Problem Relation Age of Onset  . Prostate cancer Father   . Colon cancer Neg Hx   . Colon polyps  Neg Hx      Social History  reports that he has quit smoking. His smoking use included cigarettes. He smoked 0.25 packs per day. He has never used smokeless tobacco. He reports that he has current or past drug history. Drug: Marijuana. He reports that he does not drink alcohol.  Medications  Current Outpatient Medications:  .  acetaminophen (TYLENOL) 325 MG tablet, Take 2 tablets (650 mg total) by mouth every 4 (four) hours as needed for headache., Disp: 40 tablet, Rfl: 0 .  Alpha-Lipoic Acid 600 MG CAPS, Take 1 capsule (600 mg total) by mouth daily., Disp: 30 each, Rfl: 11 .  amLODipine (NORVASC) 10 MG tablet, Take 1 tablet (10 mg total) by mouth daily., Disp: 30 tablet, Rfl: 2 .  B Complex-C (B-COMPLEX WITH VITAMIN C) tablet, Take 1 tablet by mouth daily., Disp: 30 tablet, Rfl: 11 .  gabapentin (NEURONTIN) 300 MG capsule, Take 1 capsule (300 mg total) by mouth 2 (two) times daily., Disp: 60 capsule, Rfl: 2 .  lidocaine-prilocaine (EMLA) cream, Apply 1 application topically as needed., Disp: 30 g, Rfl: 0 .  loratadine (CLARITIN) 10 MG tablet, Take 1 tablet (10 mg total) by mouth daily., Disp: 30 tablet, Rfl: 3 .  Misc. Devices MISC, Please provide the patient with the [facility]othe[facility] HPB cough Syrup. 1  teaspoonful every 4-6 hours PRN. Please provide the patient with 360 ml., Disp: 360 each, Rfl: 0 .  Morphine Sulfate (MORPHINE CONCENTRATE) 10 mg / 0.5 ml concentrated solution, Take 0.5 mLs (10 mg total) by mouth every 4 (four) hours as needed for severe pain., Disp: 240 mL, Rfl: 0 .  pantoprazole (PROTONIX) 40 MG tablet, Take 1 tablet (40 mg total) by mouth 2 (two) times daily. Take 30 minutes before breakfast, Disp: 90 tablet, Rfl: 3 .  prochlorperazine (COMPAZINE) 10 MG tablet, TAKE 1 TABLET BY MOUTH EVERY 6 HOURS AS NEEDED FOR NAUSEA AND VOMITING, Disp: 30 tablet, Rfl: 3 .  Ramucirumab (CYRAMZA IV), Inject into the vein every 14 (fourteen) days. Fridays of every other week, Disp: , Rfl:  .   ranitidine (ZANTAC) 300 MG tablet, Take 1 tablet (300 mg total) by mouth at bedtime., Disp: 60 tablet, Rfl: 1 No current facility-administered medications for this visit.   Facility-Administered Medications Ordered in Other Visits:  .  heparin lock flush 100 unit/mL, 500 Units, Intracatheter, Once PRN, Derek Jack, MD .  ramucirumab (CYRAMZA) 800 mg in sodium chloride 0.9 % 170 mL chemo infusion, 8 mg/kg (Treatment Plan Recorded), Intravenous, Once, Derek Jack, MD .  sodium chloride flush (NS) 0.9 % injection 10 mL, 10 mL, Intracatheter, PRN, Derek Jack, MD  Allergies Patient has no known allergies.  Review of Systems Review of Systems - Oncology ROS negative   Physical Exam  Vitals Wt Readings from Last 3 Encounters:  05/31/18 209 lb 12.8 oz (95.2 kg)  05/17/18 209 lb (94.8 kg)  04/12/18 212 lb 8 oz (96.4 kg)   Temp Readings from Last 3 Encounters:  05/31/18 98.1 F (36.7 C) (Oral)  05/17/18 97.6 F (36.4 C) (Oral)  05/10/18 98 F (36.7 C) (Oral)   BP Readings from Last 3 Encounters:  05/31/18 119/80  05/17/18 132/71  05/10/18 138/87   Pulse Readings from Last 3 Encounters:  05/31/18 66  05/17/18 71  05/10/18 70   Constitutional: Well-developed, well-nourished, and in no distress.   HENT: Head: Normocephalic and atraumatic.  Mouth/Throat: No oropharyngeal exudate. Mucosa moist. Eyes: Pupils are equal, round, and reactive to light. Conjunctivae are normal. No scleral icterus.  Neck: Normal range of motion. Neck supple. No JVD present.  Cardiovascular: Normal rate, regular rhythm and normal heart sounds.  Exam reveals no gallop and no friction rub.   No murmur heard. Pulmonary/Chest: Effort normal and breath sounds normal. No respiratory distress. No wheezes.No rales.  Abdominal: Soft. Bowel sounds are normal. No distension. There is no tenderness. There is no guarding.  Musculoskeletal: No edema or tenderness.  Lymphadenopathy: No  cervical, axillary or supraclavicular adenopathy.  Neurological: Alert and oriented to person, place, and time. No cranial nerve deficit.  Skin: Skin is warm and dry. No rash noted. No erythema. No pallor.  Psychiatric: Affect and judgment normal.   Labs Appointment on 05/31/2018  Component Date Value Ref Range Status  . Color, Urine 05/31/2018 YELLOW  YELLOW Final  . APPearance 05/31/2018 CLEAR  CLEAR Final  . Specific Gravity, Urine 05/31/2018 1.023  1.005 - 1.030 Final  . pH 05/31/2018 6.0  5.0 - 8.0 Final  . Glucose, UA 05/31/2018 NEGATIVE  NEGATIVE mg/dL Final  . Hgb urine dipstick 05/31/2018 NEGATIVE  NEGATIVE Final  . Bilirubin Urine 05/31/2018 NEGATIVE  NEGATIVE Final  . Ketones, ur 05/31/2018 NEGATIVE  NEGATIVE mg/dL Final  . Protein, ur 05/31/2018 NEGATIVE  NEGATIVE mg/dL Final  . Nitrite 05/31/2018 NEGATIVE  NEGATIVE Final  . Leukocytes, UA 05/31/2018 NEGATIVE  NEGATIVE Final   Performed at [facility], [address] [place], [place] [address]  . WBC 05/31/2018 5.1  4.0 - 10.5 K/uL Final  . RBC 05/31/2018 5.50  4.22 - 5.81 MIL/uL Final  . Hemoglobin 05/31/2018 13.7  13.0 - 17.0 g/dL Final  . HCT 05/31/2018 42.5  39.0 - 52.0 % Final  . MCV 05/31/2018 77.3* 80.0 - 100.0 fL Final  . MCH 05/31/2018 24.9* 26.0 - 34.0 pg Final  . MCHC 05/31/2018 32.2  30.0 - 36.0 g/dL Final  . RDW 05/31/2018 14.6  11.5 - 15.5 % Final  . Platelets 05/31/2018 181  150 - 400 K/uL Final  . nRBC 05/31/2018 0.0  0.0 - 0.2 % Final  . Neutrophils Relative % 05/31/2018 45  % Final  . Neutro Abs 05/31/2018 2.3  1.7 - 7.7 K/uL Final  . Lymphocytes Relative 05/31/2018 42  % Final  . Lymphs Abs 05/31/2018 2.1  0.7 - 4.0 K/uL Final  . Monocytes Relative 05/31/2018 7  % Final  . Monocytes Absolute 05/31/2018 0.4  0.1 - 1.0 K/uL Final  . Eosinophils Relative 05/31/2018 5  % Final  . Eosinophils Absolute 05/31/2018 0.3  0.0 - 0.5 K/uL Final  . Basophils Relative 05/31/2018 1  % Final  . Basophils  Absolute 05/31/2018 0.0  0.0 - 0.1 K/uL Final  . Immature Granulocytes 05/31/2018 0  % Final  . Abs Immature Granulocytes 05/31/2018 0.01  0.00 - 0.07 K/uL Final   Performed at [facility], [address], [place], [place] [address]  . Sodium 05/31/2018 139  135 - 145 mmol/L Final  . Potassium 05/31/2018 4.0  3.5 - 5.1 mmol/L Final  . Chloride 05/31/2018 105  98 - 111 mmol/L Final  . CO2 05/31/2018 27  22 - 32 mmol/L Final  . Glucose, Bld 05/31/2018 111* 70 - 99 mg/dL Final  . BUN 05/31/2018 16  6 - 20 mg/dL Final  . Creatinine, Ser 05/31/2018 1.07  0.61 - 1.24 mg/dL Final  . Calcium 05/31/2018 9.2  8.9 - 10.3 mg/dL Final  . Total Protein 05/31/2018 7.6  6.5 - 8.1 g/dL Final  . Albumin 05/31/2018 4.4  3.5 - 5.0 g/dL Final  . AST 05/31/2018 19  15 - 41 U/L Final  . ALT 05/31/2018 17  0 - 44 U/L Final  . Alkaline Phosphatase 05/31/2018 50  38 - 126 U/L Final  . Total Bilirubin 05/31/2018 0.5  0.3 - 1.2 mg/dL Final  . GFR calc non Af Amer 05/31/2018 >60  >60 mL/min Final  . GFR calc Af Amer 05/31/2018 >60  >60 mL/min Final   Comment: (NOTE) The eGFR has been calculated using the CKD EPI equation. This calculation has not been validated in all clinical situations. eGFR's persistently <60 mL/min signify possible Chronic Kidney Disease.   Georgiann Hahn gap 05/31/2018 7  5 - 15 Final   Performed at [facility], [address] [place],  [address]     Pathology Orders Placed This Encounter  Procedures  . CBC with Differential/Platelet    Standing Status:   Future    Standing Expiration Date:   06/01/2019  . Comprehensive metabolic panel    Standing Status:   Future    Standing Expiration Date:   06/01/2019  . Lactate dehydrogenase    Standing Status:   Future    Standing Expiration Date:   06/01/2019       Zoila Shutter MD

## 2018-05-31 NOTE — Patient Instructions (Signed)
[facility] Discharge Instructions for Patients Receiving Chemotherapy  Today you received the following chemotherapy agents   To help prevent nausea and vomiting after your treatment, we encourage you to take your nausea medication   If you develop nausea and vomiting that is not controlled by your nausea medication, call the clinic.   BELOW ARE SYMPTOMS THAT SHOULD BE REPORTED IMMEDIATELY:  *FEVER GREATER THAN 100.5 F  *CHILLS WITH OR WITHOUT FEVER  NAUSEA AND VOMITING THAT IS NOT CONTROLLED WITH YOUR NAUSEA MEDICATION  *UNUSUAL SHORTNESS OF BREATH  *UNUSUAL BRUISING OR BLEEDING  TENDERNESS IN MOUTH AND THROAT WITH OR WITHOUT PRESENCE OF ULCERS  *URINARY PROBLEMS  *BOWEL PROBLEMS  UNUSUAL RASH Items with * indicate a potential emergency and should be followed up as soon as possible.  Feel free to call the clinic should you have any questions or concerns. The clinic phone number is [phone].  Please show the CHEMO ALERT CARD at check-in to the Emergency Department and triage nurse.   

## 2018-06-04 ENCOUNTER — Other Ambulatory Visit (HOSPITAL_COMMUNITY): Payer: Self-pay | Admitting: Nurse Practitioner

## 2018-06-04 DIAGNOSIS — C159 Malignant neoplasm of esophagus, unspecified: Secondary | ICD-10-CM

## 2018-06-05 ENCOUNTER — Encounter: Payer: Self-pay | Admitting: Internal Medicine

## 2018-06-07 ENCOUNTER — Other Ambulatory Visit (HOSPITAL_COMMUNITY): Payer: Self-pay | Admitting: Hematology

## 2018-06-11 ENCOUNTER — Other Ambulatory Visit (HOSPITAL_COMMUNITY): Payer: Self-pay

## 2018-06-11 DIAGNOSIS — C159 Malignant neoplasm of esophagus, unspecified: Secondary | ICD-10-CM

## 2018-06-11 DIAGNOSIS — Z5111 Encounter for antineoplastic chemotherapy: Secondary | ICD-10-CM

## 2018-06-11 NOTE — Progress Notes (Signed)
ur

## 2018-06-14 ENCOUNTER — Other Ambulatory Visit (HOSPITAL_COMMUNITY): Payer: Self-pay | Admitting: Hematology

## 2018-06-14 ENCOUNTER — Inpatient Hospital Stay (HOSPITAL_COMMUNITY): Payer: Medicaid Other | Attending: Hematology

## 2018-06-14 ENCOUNTER — Inpatient Hospital Stay (HOSPITAL_COMMUNITY): Payer: Medicaid Other

## 2018-06-14 ENCOUNTER — Encounter (HOSPITAL_COMMUNITY): Payer: Self-pay

## 2018-06-14 VITALS — BP 124/84 | HR 82 | Temp 98.1°F | Resp 18 | Wt 206.4 lb

## 2018-06-14 DIAGNOSIS — G620 Drug-induced polyneuropathy: Secondary | ICD-10-CM | POA: Diagnosis not present

## 2018-06-14 DIAGNOSIS — G6289 Other specified polyneuropathies: Secondary | ICD-10-CM

## 2018-06-14 DIAGNOSIS — C159 Malignant neoplasm of esophagus, unspecified: Secondary | ICD-10-CM

## 2018-06-14 DIAGNOSIS — C155 Malignant neoplasm of lower third of esophagus: Secondary | ICD-10-CM | POA: Diagnosis present

## 2018-06-14 DIAGNOSIS — C770 Secondary and unspecified malignant neoplasm of lymph nodes of head, face and neck: Secondary | ICD-10-CM | POA: Diagnosis not present

## 2018-06-14 DIAGNOSIS — Z87891 Personal history of nicotine dependence: Secondary | ICD-10-CM | POA: Diagnosis not present

## 2018-06-14 DIAGNOSIS — Z5112 Encounter for antineoplastic immunotherapy: Secondary | ICD-10-CM | POA: Diagnosis present

## 2018-06-14 DIAGNOSIS — R911 Solitary pulmonary nodule: Secondary | ICD-10-CM | POA: Diagnosis not present

## 2018-06-14 DIAGNOSIS — Z79899 Other long term (current) drug therapy: Secondary | ICD-10-CM | POA: Insufficient documentation

## 2018-06-14 DIAGNOSIS — Z5111 Encounter for antineoplastic chemotherapy: Secondary | ICD-10-CM

## 2018-06-14 LAB — URINALYSIS, DIPSTICK ONLY
Bilirubin Urine: NEGATIVE
Glucose, UA: NEGATIVE mg/dL
Hgb urine dipstick: NEGATIVE
Ketones, ur: NEGATIVE mg/dL
LEUKOCYTES UA: NEGATIVE
NITRITE: NEGATIVE
PH: 5 (ref 5.0–8.0)
PROTEIN: NEGATIVE mg/dL
SPECIFIC GRAVITY, URINE: 1.029 (ref 1.005–1.030)

## 2018-06-14 LAB — CBC WITH DIFFERENTIAL/PLATELET
Abs Immature Granulocytes: 0.01 10*3/uL (ref 0.00–0.07)
BASOS ABS: 0 10*3/uL (ref 0.0–0.1)
Basophils Relative: 0 %
EOS ABS: 0.1 10*3/uL (ref 0.0–0.5)
EOS PCT: 3 %
HCT: 42.8 % (ref 39.0–52.0)
Hemoglobin: 14.1 g/dL (ref 13.0–17.0)
Immature Granulocytes: 0 %
Lymphocytes Relative: 42 %
Lymphs Abs: 2.2 10*3/uL (ref 0.7–4.0)
MCH: 25.8 pg — ABNORMAL LOW (ref 26.0–34.0)
MCHC: 32.9 g/dL (ref 30.0–36.0)
MCV: 78.2 fL — ABNORMAL LOW (ref 80.0–100.0)
Monocytes Absolute: 0.3 10*3/uL (ref 0.1–1.0)
Monocytes Relative: 6 %
NRBC: 0 % (ref 0.0–0.2)
Neutro Abs: 2.6 10*3/uL (ref 1.7–7.7)
Neutrophils Relative %: 49 %
Platelets: 136 10*3/uL — ABNORMAL LOW (ref 150–400)
RBC: 5.47 MIL/uL (ref 4.22–5.81)
RDW: 13.9 % (ref 11.5–15.5)
WBC: 5.2 10*3/uL (ref 4.0–10.5)

## 2018-06-14 LAB — COMPREHENSIVE METABOLIC PANEL
ALBUMIN: 4.4 g/dL (ref 3.5–5.0)
ALT: 26 U/L (ref 0–44)
ANION GAP: 7 (ref 5–15)
AST: 19 U/L (ref 15–41)
Alkaline Phosphatase: 54 U/L (ref 38–126)
BILIRUBIN TOTAL: 0.5 mg/dL (ref 0.3–1.2)
BUN: 15 mg/dL (ref 6–20)
CO2: 27 mmol/L (ref 22–32)
Calcium: 9 mg/dL (ref 8.9–10.3)
Chloride: 105 mmol/L (ref 98–111)
Creatinine, Ser: 0.93 mg/dL (ref 0.61–1.24)
GFR calc non Af Amer: >60 mL/min (ref >60)
GLUCOSE: 115 mg/dL — AB (ref 70–99)
POTASSIUM: 3.8 mmol/L (ref 3.5–5.1)
SODIUM: 139 mmol/L (ref 135–145)
TOTAL PROTEIN: 7.7 g/dL (ref 6.5–8.1)

## 2018-06-14 LAB — LACTATE DEHYDROGENASE: LDH: 123 U/L (ref 98–192)

## 2018-06-14 MED ORDER — SODIUM CHLORIDE 0.9 % IV SOLN
8.0000 mg/kg | Freq: Once | INTRAVENOUS | Status: AC
  Administered 2018-06-14: 800 mg via INTRAVENOUS
  Filled 2018-06-14: qty 30

## 2018-06-14 MED ORDER — HEPARIN SOD (PORK) LOCK FLUSH 100 UNIT/ML IV SOLN
500.0000 [IU] | Freq: Once | INTRAVENOUS | Status: AC | PRN
  Administered 2018-06-14: 500 [IU]

## 2018-06-14 MED ORDER — SODIUM CHLORIDE 0.9 % IV SOLN
Freq: Once | INTRAVENOUS | Status: AC
  Administered 2018-06-14: 09:00:00 via INTRAVENOUS

## 2018-06-14 MED ORDER — DIPHENHYDRAMINE HCL 50 MG/ML IJ SOLN
50.0000 mg | Freq: Once | INTRAMUSCULAR | Status: AC
  Administered 2018-06-14: 50 mg via INTRAVENOUS

## 2018-06-14 MED ORDER — SODIUM CHLORIDE 0.9% FLUSH
10.0000 mL | INTRAVENOUS | Status: DC | PRN
  Administered 2018-06-14: 10 mL
  Filled 2018-06-14: qty 10

## 2018-06-14 MED ORDER — DIPHENHYDRAMINE HCL 50 MG/ML IJ SOLN
INTRAMUSCULAR | Status: AC
  Filled 2018-06-14: qty 1

## 2018-06-14 NOTE — Progress Notes (Signed)
Patient tolerated therapy with no complaints voiced.  Port site clean and dry with no bruising or swelling noted at site.  Good blood return noted before and after administration of therapy.  Band aid applied.  VSS with discharge and left ambulatory with no s/s of distress noted.

## 2018-06-14 NOTE — Patient Instructions (Signed)
Cancer Center Discharge Instructions for Patients Receiving Chemotherapy  Today you received the following chemotherapy agents cyramza.    If you develop nausea and vomiting that is not controlled by your nausea medication, call the clinic.   BELOW ARE SYMPTOMS THAT SHOULD BE REPORTED IMMEDIATELY:  *FEVER GREATER THAN 100.5 F  *CHILLS WITH OR WITHOUT FEVER  NAUSEA AND VOMITING THAT IS NOT CONTROLLED WITH YOUR NAUSEA MEDICATION  *UNUSUAL SHORTNESS OF BREATH  *UNUSUAL BRUISING OR BLEEDING  TENDERNESS IN MOUTH AND THROAT WITH OR WITHOUT PRESENCE OF ULCERS  *URINARY PROBLEMS  *BOWEL PROBLEMS  UNUSUAL RASH Items with * indicate a potential emergency and should be followed up as soon as possible.  Feel free to call the clinic should you have any questions or concerns. The clinic phone number is [phone].  Please show the CHEMO ALERT CARD at check-in to the Emergency Department and triage nurse.   

## 2018-06-28 ENCOUNTER — Inpatient Hospital Stay (HOSPITAL_COMMUNITY): Payer: Medicaid Other

## 2018-06-28 ENCOUNTER — Encounter (HOSPITAL_COMMUNITY): Payer: Self-pay | Admitting: Hematology

## 2018-06-28 ENCOUNTER — Other Ambulatory Visit: Payer: Self-pay

## 2018-06-28 ENCOUNTER — Inpatient Hospital Stay (HOSPITAL_BASED_OUTPATIENT_CLINIC_OR_DEPARTMENT_OTHER): Payer: Medicaid Other | Admitting: Hematology

## 2018-06-28 ENCOUNTER — Encounter (HOSPITAL_COMMUNITY): Payer: Self-pay

## 2018-06-28 VITALS — BP 127/90 | HR 77 | Temp 97.8°F | Resp 18 | Wt 212.8 lb

## 2018-06-28 DIAGNOSIS — R911 Solitary pulmonary nodule: Secondary | ICD-10-CM

## 2018-06-28 DIAGNOSIS — Z5112 Encounter for antineoplastic immunotherapy: Secondary | ICD-10-CM | POA: Diagnosis not present

## 2018-06-28 DIAGNOSIS — C155 Malignant neoplasm of lower third of esophagus: Secondary | ICD-10-CM

## 2018-06-28 DIAGNOSIS — C159 Malignant neoplasm of esophagus, unspecified: Secondary | ICD-10-CM

## 2018-06-28 DIAGNOSIS — G620 Drug-induced polyneuropathy: Secondary | ICD-10-CM

## 2018-06-28 DIAGNOSIS — C770 Secondary and unspecified malignant neoplasm of lymph nodes of head, face and neck: Secondary | ICD-10-CM | POA: Diagnosis not present

## 2018-06-28 DIAGNOSIS — Z87891 Personal history of nicotine dependence: Secondary | ICD-10-CM

## 2018-06-28 DIAGNOSIS — Z5111 Encounter for antineoplastic chemotherapy: Secondary | ICD-10-CM

## 2018-06-28 LAB — CBC WITH DIFFERENTIAL/PLATELET
ABS IMMATURE GRANULOCYTES: 0.02 10*3/uL (ref 0.00–0.07)
Basophils Absolute: 0 10*3/uL (ref 0.0–0.1)
Basophils Relative: 1 %
Eosinophils Absolute: 0.2 10*3/uL (ref 0.0–0.5)
Eosinophils Relative: 3 %
HEMATOCRIT: 42.3 % (ref 39.0–52.0)
HEMOGLOBIN: 13.8 g/dL (ref 13.0–17.0)
Immature Granulocytes: 0 %
LYMPHS ABS: 2.4 10*3/uL (ref 0.7–4.0)
LYMPHS PCT: 39 %
MCH: 25.5 pg — AB (ref 26.0–34.0)
MCHC: 32.6 g/dL (ref 30.0–36.0)
MCV: 78 fL — ABNORMAL LOW (ref 80.0–100.0)
MONO ABS: 0.5 10*3/uL (ref 0.1–1.0)
Monocytes Relative: 7 %
NEUTROS ABS: 3 10*3/uL (ref 1.7–7.7)
Neutrophils Relative %: 50 %
Platelets: 153 10*3/uL (ref 150–400)
RBC: 5.42 MIL/uL (ref 4.22–5.81)
RDW: 14.6 % (ref 11.5–15.5)
WBC: 6.2 10*3/uL (ref 4.0–10.5)
nRBC: 0 % (ref 0.0–0.2)

## 2018-06-28 LAB — URINALYSIS, DIPSTICK ONLY
Bilirubin Urine: NEGATIVE
Glucose, UA: NEGATIVE mg/dL
Hgb urine dipstick: NEGATIVE
Ketones, ur: 5 mg/dL — AB
LEUKOCYTES UA: NEGATIVE
NITRITE: NEGATIVE
PH: 5 (ref 5.0–8.0)
PROTEIN: 30 mg/dL — AB
Specific Gravity, Urine: 1.029 (ref 1.005–1.030)

## 2018-06-28 LAB — COMPREHENSIVE METABOLIC PANEL
ALBUMIN: 4 g/dL (ref 3.5–5.0)
ALT: 23 U/L (ref 0–44)
ANION GAP: 8 (ref 5–15)
AST: 24 U/L (ref 15–41)
Alkaline Phosphatase: 55 U/L (ref 38–126)
BUN: 9 mg/dL (ref 6–20)
CHLORIDE: 105 mmol/L (ref 98–111)
CO2: 27 mmol/L (ref 22–32)
Calcium: 8.7 mg/dL — ABNORMAL LOW (ref 8.9–10.3)
Creatinine, Ser: 0.89 mg/dL (ref 0.61–1.24)
GFR calc Af Amer: >60 mL/min (ref >60)
GFR calc non Af Amer: >60 mL/min (ref >60)
GLUCOSE: 91 mg/dL (ref 70–99)
POTASSIUM: 3.7 mmol/L (ref 3.5–5.1)
SODIUM: 140 mmol/L (ref 135–145)
TOTAL PROTEIN: 7.3 g/dL (ref 6.5–8.1)
Total Bilirubin: 0.6 mg/dL (ref 0.3–1.2)

## 2018-06-28 LAB — LACTATE DEHYDROGENASE: LDH: 94 U/L — ABNORMAL LOW (ref 98–192)

## 2018-06-28 MED ORDER — SODIUM CHLORIDE 0.9 % IV SOLN
8.0000 mg/kg | Freq: Once | INTRAVENOUS | Status: AC
  Administered 2018-06-28: 800 mg via INTRAVENOUS
  Filled 2018-06-28: qty 50

## 2018-06-28 MED ORDER — DIPHENHYDRAMINE HCL 50 MG/ML IJ SOLN
INTRAMUSCULAR | Status: AC
  Filled 2018-06-28: qty 1

## 2018-06-28 MED ORDER — SODIUM CHLORIDE 0.9 % IV SOLN
Freq: Once | INTRAVENOUS | Status: AC
  Administered 2018-06-28: 10:00:00 via INTRAVENOUS

## 2018-06-28 MED ORDER — SODIUM CHLORIDE 0.9% FLUSH
10.0000 mL | INTRAVENOUS | Status: DC | PRN
  Administered 2018-06-28: 10 mL
  Filled 2018-06-28: qty 10

## 2018-06-28 MED ORDER — HEPARIN SOD (PORK) LOCK FLUSH 100 UNIT/ML IV SOLN
500.0000 [IU] | Freq: Once | INTRAVENOUS | Status: AC | PRN
  Administered 2018-06-28: 500 [IU]

## 2018-06-28 MED ORDER — DIPHENHYDRAMINE HCL 50 MG/ML IJ SOLN
50.0000 mg | Freq: Once | INTRAMUSCULAR | Status: AC
  Administered 2018-06-28: 50 mg via INTRAVENOUS

## 2018-06-28 NOTE — Progress Notes (Signed)
Patient seen by the oncologist with lab review.  Ok to treat today.    Patient tolerated chemotherapy with no complaints voiced.  Good blood return noted before and after administration of therapy.  Band aid applied.  VSS with discharge and left ambulatory with no s/s of distress noted.

## 2018-06-28 NOTE — Patient Instructions (Signed)
[place] Discharge Instructions for Patients Receiving Chemotherapy  Today you received the following chemotherapy agents cyramza.    If you develop nausea and vomiting that is not controlled by your nausea medication, call the clinic.   BELOW ARE SYMPTOMS THAT SHOULD BE REPORTED IMMEDIATELY:  *FEVER GREATER THAN 100.5 F  *CHILLS WITH OR WITHOUT FEVER  NAUSEA AND VOMITING THAT IS NOT CONTROLLED WITH YOUR NAUSEA MEDICATION  *UNUSUAL SHORTNESS OF BREATH  *UNUSUAL BRUISING OR BLEEDING  TENDERNESS IN MOUTH AND THROAT WITH OR WITHOUT PRESENCE OF ULCERS  *URINARY PROBLEMS  *BOWEL PROBLEMS  UNUSUAL RASH Items with * indicate a potential emergency and should be followed up as soon as possible.  Feel free to call the clinic should you have any questions or concerns. The clinic phone number is [phone].  Please show the [place] at check-in to the Emergency Department and triage nurse.

## 2018-06-28 NOTE — Progress Notes (Signed)
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 Barney Drain. Partially obstructing, likely malignant esophageal tumor was found in the distal esophagus. Biopsied with surgical path demonstrating Adenocarcinoma. Injected. Treated with argon plasma coagulation (APC) #3. 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 Biopsied with surgical path demonstrating Adenocarcinoma.    01/19/2017 PET scan    NECK Two FDG avid nodes are seen in the base of the neck on the right. The first is seen on CT image 35, just posterior to the right thyroid lobe and the other is seen on CT image 41 measuring 16 mm in short axis with a maximum SUV of 8.6.  CHEST  The patient's known malignancy involving the distal esophagus and cardia of the stomach is FDG avid with a maximum SUV of 10.4. No definitive metastatic nodes within the chest.  ABDOMEN/PELVIS  FDG avid metastatic adenopathy is seen in the gastrohepatic ligament, paraceliac nodes, posterior to the IVC on image 111, and to the left of the SMA on image 112. The most inferior node is seen in the aortocaval region on image 117. The representative node to the left of the SMA was measured on series 4, image 111 measuring 2.5 by 2.1 cm with a maximum SUV of 7. No other FDG avid disease in the abdomen or pelvis. Cholelithiasis is identified.    02/02/2017 Procedure    US guided bx of right supraclavicular LN by IR.    02/05/2017 Pathology Results    Lymph node, needle/core biopsy, Right Supraclavicular -  METASTATIC POORLY DIFFERENTIATED CARCINOMA.    02/10/2017 Pathology Results    HER2 - NEGATIVE    02/13/2017 Procedure    Port placed by IR    02/15/2017 - 04/25/2017 Chemotherapy    FOLFOX x 6 cycles     05/02/2017 Imaging    CT C/A/P: IMPRESSION: 1. Nodular thickening and hyperenhancement in the distal esophagus and gastric cardia could reflect residual tumor. 2. No significant change in size of previously demonstrated hypermetabolic upper abdominal lymph nodes. No progressive adenopathy identified. No residual enlarged supraclavicular nodes are seen. 3. No evidence of progressive metastatic disease. No worrisome hepatic findings. 4. Tiny right upper lobe pulmonary nodule, not clearly seen on low dose previous PET-CT. Attention on follow-up recommended. 5. Cholelithiasis.      CANCER STAGING: Cancer Staging Primary esophageal adenocarcinoma [facility]) Staging form: Esophagus - Adenocarcinoma, AJCC 8th Edition - Clinical stage from 02/11/2017: Stage IVA (cTX, cN3, cM0) - Signed by Baird Cancer, PA-C on 02/11/2017    INTERVAL HISTORY:  Maxwell Henderson 52 y.o. male returns for follow-up of his esophageal cancer and treatment.  He is tolerating Cyramza very well.  Appetite is 50% energy levels are 75%.  He is drinking Ensure per day.  Numbness in the feet has been stable.  He is continuing gabapentin.  He does not report any dysphagia.  No nausea or vomiting was reported.  No hematuria or rectal bleeding was reported.  No nosebleeds were reported.  Denies any recent infections or hospitalizations.    REVIEW OF SYSTEMS:  Review of Systems  Neurological: Positive for numbness (feet).  All other systems reviewed and are negative.    PAST MEDICAL/SURGICAL HISTORY:  Past Medical History:  Diagnosis Date  . Hypertension   . 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 ESOPHAGOGASTRODUODENOSCOPY (EGD) WITH PROPOFOL N/A 01/16/2017   Procedure: ESOPHAGOGASTRODUODENOSCOPY (EGD) WITH PROPOFOL;  Surgeon: [name], MD;  Location: [facility];  Service: Endoscopy;  Laterality: N/A;  730   . IR FLUORO GUIDE PORT INSERTION RIGHT  02/13/2017  . IR US GUIDE VASC ACCESS RIGHT  02/13/2017  . lipoma removal    . PORTA CATH INSERTION Right 02/13/2017  . SAVORY DILATION N/A 01/16/2017   Procedure: SAVORY DILATION;  Surgeon: [name], MD;  Location: [facility];  Service: Endoscopy;  Laterality: N/A;     SOCIAL HISTORY:  Social History   Socioeconomic History  . Marital status: Married    Spouse name: Not on file  . Number of children: Not on file  . Years of education: Not on file  . Highest education level: Not on file  Occupational History  . Not on file  Social Needs  . Financial resource strain: Not on file  . Food insecurity:    Worry: Not on file    Inability: Not on file  . Transportation needs:    Medical: Not on file    Non-medical: Not on file  Tobacco Use  . Smoking status: Former Smoker    Packs/day: 0.25    Types: Cigarettes  . Smokeless tobacco: Never Used  Substance and Sexual Activity  . Alcohol use: No    Comment: hx heavy alcohol, hasn't drank in 19 yrs  . Drug use: Yes    Types: Marijuana    Comment: history of marijuana   . Sexual activity: Yes  Lifestyle  . Physical activity:    Days per week: Not on file    Minutes per session: Not on file  . Stress: Not on file  Relationships  . Social connections:    Talks on phone: Not on file    Gets together: Not on file    Attends religious service: Not on file    Active member of club or organization: Not on file    Attends meetings of clubs or organizations: Not on file    Relationship status: Not on file  . Intimate partner violence:    Fear of current or ex partner: Not on file    Emotionally abused: Not on file    Physically abused: Not on file     Forced sexual activity: Not on file  Other Topics Concern  . Not on file  Social History Narrative   WORKING [employer].    FAMILY HISTORY:  Family History  Problem Relation Age of Onset  . Prostate cancer Father   . Colon cancer Neg Hx   . Colon polyps Neg Hx     CURRENT MEDICATIONS:  Outpatient Encounter Medications as of 06/28/2018  Medication Sig  . acetaminophen (TYLENOL) 325 MG tablet Take 2 tablets (650 mg total) by mouth every 4 (four) hours as needed for headache.  . Alpha-Lipoic Acid 600 MG CAPS Take 1 capsule (600 mg total) by mouth daily.  Marland Kitchen amLODipine (NORVASC) 10 MG tablet TAKE 1 TABLET BY MOUTH ONCE DAILY  . B Complex-C (B-COMPLEX WITH VITAMIN C)  tablet Take 1 tablet by mouth daily.  Marland Kitchen gabapentin (NEURONTIN) 300 MG capsule Take 1 capsule (300 mg total) by mouth 2 (two) times daily.  Marland Kitchen lidocaine-prilocaine (EMLA) cream Apply 1 application topically as needed.  . loratadine (CLARITIN) 10 MG tablet Take 1 tablet (10 mg total) by mouth daily.  . Misc. Devices MISC Please provide the patient with the [facility] HPB cough Syrup. 1 teaspoonful every 4-6 hours PRN. Please provide the patient with 360 ml.  . Morphine Sulfate (MORPHINE CONCENTRATE) 10 mg / 0.5 ml concentrated solution Take 0.5 mLs (10 mg total) by mouth every 4 (four) hours as needed for severe pain.  . pantoprazole (PROTONIX) 40 MG tablet Take 1 tablet (40 mg total) by mouth 2 (two) times daily. Take 30 minutes before breakfast  . prochlorperazine (COMPAZINE) 10 MG tablet TAKE 1 TABLET BY MOUTH EVERY 6 HOURS AS NEEDED FOR NAUSEA AND VOMITING  . Ramucirumab (CYRAMZA IV) Inject into the vein every 14 (fourteen) days. Fridays of every other week  . ranitidine (ZANTAC) 300 MG tablet Take 1 tablet (300 mg total) by mouth at bedtime.   No facility-administered encounter medications on file as of 06/28/2018.     ALLERGIES:  No Known Allergies   PHYSICAL EXAM:  ECOG Performance status: 1  I  have independently reviewed his vitals.  Blood pressure is 126/81 pulse is 75 respirate is 18, temperature is 98. Physical Exam  Constitutional: He is oriented to person, place, and time. He appears well-developed and well-nourished.  Cardiovascular: Normal rate, regular rhythm and normal heart sounds.  Pulmonary/Chest: Effort normal and breath sounds normal.  Neurological: He is alert and oriented to person, place, and time.  Skin: Skin is warm and dry.  Psychiatric: He has a normal mood and affect. His behavior is normal. Judgment and thought content normal.  Abdomen: Soft nontender with no palpable abnormality. Extremities: No edema or cyanosis.   LABORATORY DATA:  I have reviewed the labs as listed.  CBC    Component Value Date/Time   WBC 6.2 06/28/2018 0824   RBC 5.42 06/28/2018 0824   HGB 13.8 06/28/2018 0824   HCT 42.3 06/28/2018 0824   PLT 153 06/28/2018 0824   MCV 78.0 (L) 06/28/2018 0824   MCH 25.5 (L) 06/28/2018 0824   MCHC 32.6 06/28/2018 0824   RDW 14.6 06/28/2018 0824   LYMPHSABS 2.4 06/28/2018 0824   MONOABS 0.5 06/28/2018 0824   EOSABS 0.2 06/28/2018 0824   BASOSABS 0.0 06/28/2018 0824   CMP Latest Ref Rng & Units 06/28/2018 06/14/2018 05/31/2018  Glucose 70 - 99 mg/dL 91 115(H) 111(H)  BUN 6 - 20 mg/dL _0 Creatinine 0.61 - 1.24 mg/dL 0.89 0.93 1.07  Sodium 135 - 145 mmol/L 140 139 139  Potassium 3.5 - 5.1 mmol/L 3.7 3.8 4.0  Chloride 98 - 111 mmol/L 105 105 105  CO2 22 - 32 mmol/L _1 Calcium 8.9 - 10.3 mg/dL 8.7(L) 9.0 9.2  Total Protein 6.5 - 8.1 g/dL 7.3 7.7 7.6  Total Bilirubin 0.3 - 1.2 mg/dL 0.6 0.5 0.5  Alkaline Phos 38 - 126 U/L 55 54 50  AST 15 - 41 U/L _2 ALT 0 - 44 U/L _3 DIAGNOSTIC IMAGING:  I have reviewed images of the CT scan dated 03/22/2018 and discussed with patient.     ASSESSMENT & PLAN:   Primary esophageal adenocarcinoma ([facility]) 1.  Stage IV GE junction  adenocarcinoma: - 6 cycles of FOLFOX  from 02/15/2017 through 04/25/2017 -Paclitaxel and Cyramza from 05/18/2017, paclitaxel discontinued on 09/21/2017 secondary to neuropathy - Foundation 1 CDX showing MS-stable, TMB 3Muts/mb - CT scan on 11/28/2017 showing 4 mm right lower lobe nodule which is new, with stable disease elsewhere. - He was admitted to the hospital from 12/10/2017 through 12/12/2017 with pneumonia from parainfluenza 3. -Last Cyramza was given on 03/22/2018.  His renal function normalized today. - Last CT scan on 03/22/2018 showed resolution of the right supraclavicular adenopathy and mediastinal adenopathy.  Residual soft tissue thickening involving the distal GE junction with no obvious mass.  Largely resolved upper abdominal adenopathy.  There is a persistent partially necrotic celiac axis lymph node which is stable.  No pulmonary metastatic disease. -he is continuing to tolerate Cyramza very well.  Denies any bleeding episodes.  Blood pressure is very well controlled. -He may proceed with his treatment today and in 2 weeks.  I plan to repeat CT scans in 4 weeks and see him back to discuss the results.  2.  Peripheral neuropathy: -he has bilateral toe numbness which hurts at times.  Taxol was discontinued on 09/21/2017.  He is taking gabapentin 3 times a day.  3.  High risk drug monitoring: -We are checking his UA periodically for proteinuria.  The most recent one was okay to proceed with treatment.        Orders placed this encounter:  Orders Placed This Encounter  Procedures  . CT Abdomen Pelvis W Contrast  . CT Chest W Contrast      Derek Jack, MD [place] [phone]

## 2018-06-28 NOTE — Assessment & Plan Note (Signed)
1.  Stage IV GE junction adenocarcinoma: - 6 cycles of FOLFOX from 02/15/2017 through 04/25/2017 -Paclitaxel and Cyramza from 05/18/2017, paclitaxel discontinued on 09/21/2017 secondary to neuropathy - Foundation 1 CDX showing MS-stable, TMB 3Muts/mb - CT scan on 11/28/2017 showing 4 mm right lower lobe nodule which is new, with stable disease elsewhere. - He was admitted to the hospital from 12/10/2017 through 12/12/2017 with pneumonia from parainfluenza 3. -Last Cyramza was given on 03/22/2018.  His renal function normalized today. - Last CT scan on 03/22/2018 showed resolution of the right supraclavicular adenopathy and mediastinal adenopathy.  Residual soft tissue thickening involving the distal GE junction with no obvious mass.  Largely resolved upper abdominal adenopathy.  There is a persistent partially necrotic celiac axis lymph node which is stable.  No pulmonary metastatic disease. -he is continuing to tolerate Cyramza very well.  Denies any bleeding episodes.  Blood pressure is very well controlled. -He may proceed with his treatment today and in 2 weeks.  I plan to repeat CT scans in 4 weeks and see him back to discuss the results.  2.  Peripheral neuropathy: -he has bilateral toe numbness which hurts at times.  Taxol was discontinued on 09/21/2017.  He is taking gabapentin 3 times a day.  3.  High risk drug monitoring: -We are checking his UA periodically for proteinuria.  The most recent one was okay to proceed with treatment.

## 2018-07-08 ENCOUNTER — Other Ambulatory Visit (HOSPITAL_COMMUNITY): Payer: Self-pay

## 2018-07-08 DIAGNOSIS — C159 Malignant neoplasm of esophagus, unspecified: Secondary | ICD-10-CM

## 2018-07-08 DIAGNOSIS — Z5111 Encounter for antineoplastic chemotherapy: Secondary | ICD-10-CM

## 2018-07-13 ENCOUNTER — Other Ambulatory Visit (HOSPITAL_COMMUNITY): Payer: Self-pay | Admitting: Hematology

## 2018-07-13 DIAGNOSIS — C159 Malignant neoplasm of esophagus, unspecified: Secondary | ICD-10-CM

## 2018-07-19 ENCOUNTER — Other Ambulatory Visit: Payer: Self-pay

## 2018-07-19 ENCOUNTER — Inpatient Hospital Stay (HOSPITAL_COMMUNITY): Payer: Medicaid Other

## 2018-07-19 ENCOUNTER — Inpatient Hospital Stay (HOSPITAL_COMMUNITY): Payer: Medicaid Other | Attending: Hematology

## 2018-07-19 ENCOUNTER — Encounter (HOSPITAL_COMMUNITY): Payer: Self-pay

## 2018-07-19 VITALS — BP 145/94 | HR 78 | Temp 98.6°F | Resp 18 | Wt 210.6 lb

## 2018-07-19 DIAGNOSIS — Z5111 Encounter for antineoplastic chemotherapy: Secondary | ICD-10-CM

## 2018-07-19 DIAGNOSIS — C155 Malignant neoplasm of lower third of esophagus: Secondary | ICD-10-CM | POA: Insufficient documentation

## 2018-07-19 DIAGNOSIS — C770 Secondary and unspecified malignant neoplasm of lymph nodes of head, face and neck: Secondary | ICD-10-CM | POA: Diagnosis not present

## 2018-07-19 DIAGNOSIS — C159 Malignant neoplasm of esophagus, unspecified: Secondary | ICD-10-CM

## 2018-07-19 DIAGNOSIS — Z5112 Encounter for antineoplastic immunotherapy: Secondary | ICD-10-CM | POA: Diagnosis present

## 2018-07-19 LAB — COMPREHENSIVE METABOLIC PANEL
ALBUMIN: 4.1 g/dL (ref 3.5–5.0)
ALT: 19 U/L (ref 0–44)
AST: 30 U/L (ref 15–41)
Alkaline Phosphatase: 48 U/L (ref 38–126)
Anion gap: 7 (ref 5–15)
BUN: 10 mg/dL (ref 6–20)
CALCIUM: 8.5 mg/dL — AB (ref 8.9–10.3)
CO2: 25 mmol/L (ref 22–32)
Chloride: 108 mmol/L (ref 98–111)
Creatinine, Ser: 0.9 mg/dL (ref 0.61–1.24)
GFR calc Af Amer: >60 mL/min (ref >60)
GFR calc non Af Amer: >60 mL/min (ref >60)
GLUCOSE: 101 mg/dL — AB (ref 70–99)
Potassium: 3.2 mmol/L — ABNORMAL LOW (ref 3.5–5.1)
Sodium: 140 mmol/L (ref 135–145)
Total Bilirubin: 0.5 mg/dL (ref 0.3–1.2)
Total Protein: 7.4 g/dL (ref 6.5–8.1)

## 2018-07-19 LAB — CBC WITH DIFFERENTIAL/PLATELET
ABS IMMATURE GRANULOCYTES: 0 10*3/uL (ref 0.00–0.07)
Basophils Absolute: 0 10*3/uL (ref 0.0–0.1)
Basophils Relative: 1 %
Eosinophils Absolute: 0.2 10*3/uL (ref 0.0–0.5)
Eosinophils Relative: 4 %
HEMATOCRIT: 39.8 % (ref 39.0–52.0)
HEMOGLOBIN: 13 g/dL (ref 13.0–17.0)
Immature Granulocytes: 0 %
Lymphocytes Relative: 37 %
Lymphs Abs: 1.7 10*3/uL (ref 0.7–4.0)
MCH: 25.3 pg — ABNORMAL LOW (ref 26.0–34.0)
MCHC: 32.7 g/dL (ref 30.0–36.0)
MCV: 77.6 fL — ABNORMAL LOW (ref 80.0–100.0)
Monocytes Absolute: 0.4 10*3/uL (ref 0.1–1.0)
Monocytes Relative: 9 %
NEUTROS ABS: 2.2 10*3/uL (ref 1.7–7.7)
Neutrophils Relative %: 49 %
Platelets: 173 10*3/uL (ref 150–400)
RBC: 5.13 MIL/uL (ref 4.22–5.81)
RDW: 14.3 % (ref 11.5–15.5)
WBC: 4.5 10*3/uL (ref 4.0–10.5)
nRBC: 0 % (ref 0.0–0.2)

## 2018-07-19 LAB — URINALYSIS, DIPSTICK ONLY
Bilirubin Urine: NEGATIVE
Glucose, UA: NEGATIVE mg/dL
Hgb urine dipstick: NEGATIVE
Ketones, ur: 5 mg/dL — AB
Leukocytes, UA: NEGATIVE
Nitrite: NEGATIVE
PH: 5 (ref 5.0–8.0)
Protein, ur: NEGATIVE mg/dL
SPECIFIC GRAVITY, URINE: 1.026 (ref 1.005–1.030)

## 2018-07-19 MED ORDER — SODIUM CHLORIDE 0.9 % IV SOLN
Freq: Once | INTRAVENOUS | Status: AC
  Administered 2018-07-19: 10:00:00 via INTRAVENOUS

## 2018-07-19 MED ORDER — HEPARIN SOD (PORK) LOCK FLUSH 100 UNIT/ML IV SOLN
500.0000 [IU] | Freq: Once | INTRAVENOUS | Status: AC | PRN
  Administered 2018-07-19: 500 [IU]
  Filled 2018-07-19: qty 5

## 2018-07-19 MED ORDER — POTASSIUM CHLORIDE CRYS ER 20 MEQ PO TBCR
40.0000 meq | EXTENDED_RELEASE_TABLET | Freq: Once | ORAL | Status: AC
  Administered 2018-07-19: 40 meq via ORAL
  Filled 2018-07-19: qty 2

## 2018-07-19 MED ORDER — DIPHENHYDRAMINE HCL 50 MG/ML IJ SOLN
50.0000 mg | Freq: Once | INTRAMUSCULAR | Status: AC
  Administered 2018-07-19: 50 mg via INTRAVENOUS
  Filled 2018-07-19: qty 1

## 2018-07-19 MED ORDER — SODIUM CHLORIDE 0.9 % IV SOLN
8.0000 mg/kg | Freq: Once | INTRAVENOUS | Status: AC
  Administered 2018-07-19: 800 mg via INTRAVENOUS
  Filled 2018-07-19: qty 30

## 2018-07-19 NOTE — Progress Notes (Signed)
Tolerated infusion w/o adverse reaction.  Alert, in no distress.  VSS.  Discharged ambulatory.  

## 2018-07-26 ENCOUNTER — Other Ambulatory Visit (HOSPITAL_COMMUNITY): Payer: Self-pay

## 2018-07-26 DIAGNOSIS — C159 Malignant neoplasm of esophagus, unspecified: Secondary | ICD-10-CM

## 2018-07-29 ENCOUNTER — Other Ambulatory Visit (HOSPITAL_COMMUNITY): Payer: Self-pay | Admitting: Nurse Practitioner

## 2018-07-29 DIAGNOSIS — C159 Malignant neoplasm of esophagus, unspecified: Secondary | ICD-10-CM

## 2018-07-30 ENCOUNTER — Ambulatory Visit (HOSPITAL_COMMUNITY): Admission: RE | Admit: 2018-07-30 | Payer: Medicaid Other | Source: Ambulatory Visit

## 2018-08-02 ENCOUNTER — Ambulatory Visit (HOSPITAL_COMMUNITY): Payer: Medicaid Other | Admitting: Hematology

## 2018-08-02 ENCOUNTER — Inpatient Hospital Stay (HOSPITAL_COMMUNITY): Payer: Medicaid Other

## 2018-08-02 ENCOUNTER — Encounter (HOSPITAL_COMMUNITY): Payer: Self-pay

## 2018-08-02 VITALS — BP 128/81 | HR 66 | Temp 98.0°F | Resp 16 | Wt 210.6 lb

## 2018-08-02 DIAGNOSIS — Z5111 Encounter for antineoplastic chemotherapy: Secondary | ICD-10-CM

## 2018-08-02 DIAGNOSIS — Z5112 Encounter for antineoplastic immunotherapy: Secondary | ICD-10-CM | POA: Diagnosis not present

## 2018-08-02 DIAGNOSIS — C159 Malignant neoplasm of esophagus, unspecified: Secondary | ICD-10-CM

## 2018-08-02 LAB — URINALYSIS, DIPSTICK ONLY
BILIRUBIN URINE: NEGATIVE
Glucose, UA: NEGATIVE mg/dL
Hgb urine dipstick: NEGATIVE
Ketones, ur: 5 mg/dL — AB
Leukocytes, UA: NEGATIVE
NITRITE: NEGATIVE
Protein, ur: 30 mg/dL — AB
Specific Gravity, Urine: 1.03 (ref 1.005–1.030)
pH: 5 (ref 5.0–8.0)

## 2018-08-02 LAB — COMPREHENSIVE METABOLIC PANEL
ALT: 23 U/L (ref 0–44)
AST: 22 U/L (ref 15–41)
Albumin: 4.2 g/dL (ref 3.5–5.0)
Alkaline Phosphatase: 62 U/L (ref 38–126)
Anion gap: 9 (ref 5–15)
BUN: 11 mg/dL (ref 6–20)
CO2: 23 mmol/L (ref 22–32)
CREATININE: 0.99 mg/dL (ref 0.61–1.24)
Calcium: 8.8 mg/dL — ABNORMAL LOW (ref 8.9–10.3)
Chloride: 107 mmol/L (ref 98–111)
GFR calc Af Amer: >60 mL/min (ref >60)
GFR calc non Af Amer: >60 mL/min (ref >60)
Glucose, Bld: 123 mg/dL — ABNORMAL HIGH (ref 70–99)
Potassium: 4.3 mmol/L (ref 3.5–5.1)
Sodium: 139 mmol/L (ref 135–145)
Total Bilirubin: 0.1 mg/dL — ABNORMAL LOW (ref 0.3–1.2)
Total Protein: 7.1 g/dL (ref 6.5–8.1)

## 2018-08-02 LAB — CBC WITH DIFFERENTIAL/PLATELET
Abs Immature Granulocytes: 0.02 10*3/uL (ref 0.00–0.07)
Basophils Absolute: 0.1 10*3/uL (ref 0.0–0.1)
Basophils Relative: 1 %
Eosinophils Absolute: 0.2 10*3/uL (ref 0.0–0.5)
Eosinophils Relative: 3 %
HCT: 44.4 % (ref 39.0–52.0)
Hemoglobin: 14.8 g/dL (ref 13.0–17.0)
Immature Granulocytes: 0 %
Lymphocytes Relative: 36 %
Lymphs Abs: 2.5 10*3/uL (ref 0.7–4.0)
MCH: 26.4 pg (ref 26.0–34.0)
MCHC: 33.3 g/dL (ref 30.0–36.0)
MCV: 79.1 fL — ABNORMAL LOW (ref 80.0–100.0)
Monocytes Absolute: 0.6 10*3/uL (ref 0.1–1.0)
Monocytes Relative: 8 %
NEUTROS ABS: 3.6 10*3/uL (ref 1.7–7.7)
Neutrophils Relative %: 52 %
PLATELETS: 194 10*3/uL (ref 150–400)
RBC: 5.61 MIL/uL (ref 4.22–5.81)
RDW: 15.8 % — ABNORMAL HIGH (ref 11.5–15.5)
WBC: 6.9 10*3/uL (ref 4.0–10.5)
nRBC: 0 % (ref 0.0–0.2)

## 2018-08-02 MED ORDER — DIPHENHYDRAMINE HCL 50 MG/ML IJ SOLN
50.0000 mg | Freq: Once | INTRAMUSCULAR | Status: AC
  Administered 2018-08-02: 50 mg via INTRAVENOUS

## 2018-08-02 MED ORDER — SODIUM CHLORIDE 0.9% FLUSH
10.0000 mL | INTRAVENOUS | Status: DC | PRN
  Administered 2018-08-02: 10 mL
  Filled 2018-08-02: qty 10

## 2018-08-02 MED ORDER — SODIUM CHLORIDE 0.9 % IV SOLN
Freq: Once | INTRAVENOUS | Status: AC
  Administered 2018-08-02: 10:00:00 via INTRAVENOUS

## 2018-08-02 MED ORDER — HEPARIN SOD (PORK) LOCK FLUSH 100 UNIT/ML IV SOLN
500.0000 [IU] | Freq: Once | INTRAVENOUS | Status: AC | PRN
  Administered 2018-08-02: 500 [IU]

## 2018-08-02 MED ORDER — SODIUM CHLORIDE 0.9 % IV SOLN
8.0000 mg/kg | Freq: Once | INTRAVENOUS | Status: AC
  Administered 2018-08-02: 800 mg via INTRAVENOUS
  Filled 2018-08-02: qty 30

## 2018-08-02 MED ORDER — DIPHENHYDRAMINE HCL 50 MG/ML IJ SOLN
INTRAMUSCULAR | Status: AC
  Filled 2018-08-02: qty 1

## 2018-08-02 NOTE — Patient Instructions (Signed)
[facility] Discharge Instructions for Patients Receiving Chemotherapy   Beginning January 23rd 2017 lab work for the [facility] will be done in the  Main lab at [facility] on 1st floor. If you have a lab appointment with the [facility] please come in thru the  Main Entrance and check in at the main information desk   Today you received the following chemotherapy agents Cyramza. Follow-up as scheduled. Call clinic for any questions or concerns  To help prevent nausea and vomiting after your treatment, we encourage you to take your nausea medication   If you develop nausea and vomiting, or diarrhea that is not controlled by your medication, call the clinic.  The clinic phone number is [phone]. Office hours are Monday-Friday 8:30am-5:00pm.  BELOW ARE SYMPTOMS THAT SHOULD BE REPORTED IMMEDIATELY:  *FEVER GREATER THAN 101.0 F  *CHILLS WITH OR WITHOUT FEVER  NAUSEA AND VOMITING THAT IS NOT CONTROLLED WITH YOUR NAUSEA MEDICATION  *UNUSUAL SHORTNESS OF BREATH  *UNUSUAL BRUISING OR BLEEDING  TENDERNESS IN MOUTH AND THROAT WITH OR WITHOUT PRESENCE OF ULCERS  *URINARY PROBLEMS  *BOWEL PROBLEMS  UNUSUAL RASH Items with * indicate a potential emergency and should be followed up as soon as possible. If you have an emergency after office hours please contact your primary care physician or go to the nearest emergency department.  Please call the clinic during office hours if you have any questions or concerns.   You may also contact the Patient Navigator at [phone] should you have any questions or need assistance in obtaining follow up care.      Resources For Cancer Patients and their Caregivers ? American Cancer Society: Can assist with transportation, wigs, general needs, runs Look Good Feel Better.        2077013375 ? Cancer Care: Provides financial assistance, online support groups, medication/co-pay assistance.  1-800-813-HOPE  (807)274-1768) ? [facility] Assists [place] cancer patients and their families through emotional , educational and financial support.  254-091-8477 ? [place] DSS Where to apply for food stamps, Medicaid and utility assistance. (202)515-6158 ? RCATS: Transportation to medical appointments. 307-699-1418 ? Social Security Administration: May apply for disability if have a Stage IV cancer. 843-713-2972 4378880286 ? [employer], Disability and Transit Services: Assists with nutrition, care and transit needs. 5732273858

## 2018-08-02 NOTE — Progress Notes (Signed)
0955 Lab and urine results reviewed with Dr. Delton Coombes and pt approved for Cyramza infusion today per MD                                                              Tracey Harries tolerated Cyramza infusion well without complaints or incident. VSS upon discharge. Pt discharged self ambulatory in satisfactory condition

## 2018-08-02 NOTE — Progress Notes (Signed)
Nutrition Follow-up:  Patient with stage IV esophageal adenocarcinoma. Patient receiving cyramza.    Met with patient today during infusion.  Reports appetite is up and down.  Reports some days eats everything in site and other days not as much.  Reports knows still needs to eat.  Has been drinking ensure some, has ran out of case RD gave him and has not bought anymore.  Requesting ensure today.    Denies any other nutrition impact symptoms.      Medications: reviewed  Labs: reviewed  Anthropometrics:   Weight has increased to 210 lb 9.6 oz today from 209 lb in 10/4.     NUTRITION DIAGNOSIS:  Inadequate oral intake stable   INTERVENTION:  Gave patient 2nd case of ensure enlive today Encouraged patient to maximize good days when feel like eating well.  Encouraged patient to continue to focus on good nutrition to maintain weight    MONITORING, EVALUATION, GOAL: weight trends, intake   NEXT VISIT: as needed  Breeann Reposa B. Zenia Resides, [place], [place] Registered Dietitian [phone] (pager)

## 2018-08-09 ENCOUNTER — Other Ambulatory Visit (HOSPITAL_COMMUNITY): Payer: Self-pay | Admitting: Hematology

## 2018-08-09 DIAGNOSIS — C159 Malignant neoplasm of esophagus, unspecified: Secondary | ICD-10-CM

## 2018-08-15 ENCOUNTER — Telehealth: Payer: Self-pay | Admitting: Gastroenterology

## 2018-08-15 ENCOUNTER — Other Ambulatory Visit (HOSPITAL_COMMUNITY): Payer: Self-pay | Admitting: Hematology

## 2018-08-15 ENCOUNTER — Encounter: Payer: Self-pay | Admitting: Internal Medicine

## 2018-08-15 ENCOUNTER — Ambulatory Visit: Payer: Self-pay | Admitting: Gastroenterology

## 2018-08-15 ENCOUNTER — Other Ambulatory Visit (HOSPITAL_COMMUNITY): Payer: Self-pay

## 2018-08-15 DIAGNOSIS — C159 Malignant neoplasm of esophagus, unspecified: Secondary | ICD-10-CM

## 2018-08-15 NOTE — Telephone Encounter (Signed)
Patient was a no show and letter sent  °

## 2018-08-16 ENCOUNTER — Inpatient Hospital Stay (HOSPITAL_COMMUNITY): Payer: Medicaid Other

## 2018-08-16 ENCOUNTER — Encounter (HOSPITAL_COMMUNITY): Payer: Self-pay

## 2018-08-16 ENCOUNTER — Inpatient Hospital Stay (HOSPITAL_COMMUNITY): Payer: Medicaid Other | Attending: Hematology

## 2018-08-16 ENCOUNTER — Other Ambulatory Visit: Payer: Self-pay

## 2018-08-16 VITALS — BP 124/88 | HR 69 | Temp 97.8°F | Resp 18 | Wt 210.4 lb

## 2018-08-16 DIAGNOSIS — C155 Malignant neoplasm of lower third of esophagus: Secondary | ICD-10-CM | POA: Diagnosis present

## 2018-08-16 DIAGNOSIS — Z5112 Encounter for antineoplastic immunotherapy: Secondary | ICD-10-CM | POA: Diagnosis present

## 2018-08-16 DIAGNOSIS — Z5111 Encounter for antineoplastic chemotherapy: Secondary | ICD-10-CM

## 2018-08-16 DIAGNOSIS — G620 Drug-induced polyneuropathy: Secondary | ICD-10-CM | POA: Insufficient documentation

## 2018-08-16 DIAGNOSIS — Z79899 Other long term (current) drug therapy: Secondary | ICD-10-CM | POA: Insufficient documentation

## 2018-08-16 DIAGNOSIS — C159 Malignant neoplasm of esophagus, unspecified: Secondary | ICD-10-CM

## 2018-08-16 DIAGNOSIS — Z87891 Personal history of nicotine dependence: Secondary | ICD-10-CM | POA: Diagnosis not present

## 2018-08-16 DIAGNOSIS — C770 Secondary and unspecified malignant neoplasm of lymph nodes of head, face and neck: Secondary | ICD-10-CM | POA: Diagnosis present

## 2018-08-16 LAB — URINALYSIS, DIPSTICK ONLY
Bilirubin Urine: NEGATIVE
GLUCOSE, UA: NEGATIVE mg/dL
Hgb urine dipstick: NEGATIVE
Ketones, ur: NEGATIVE mg/dL
Leukocytes, UA: NEGATIVE
Nitrite: NEGATIVE
Protein, ur: NEGATIVE mg/dL
Specific Gravity, Urine: 1.019 (ref 1.005–1.030)
pH: 6 (ref 5.0–8.0)

## 2018-08-16 LAB — CBC WITH DIFFERENTIAL/PLATELET
Abs Immature Granulocytes: 0 10*3/uL (ref 0.00–0.07)
Basophils Absolute: 0 10*3/uL (ref 0.0–0.1)
Basophils Relative: 1 %
Eosinophils Absolute: 0.2 10*3/uL (ref 0.0–0.5)
Eosinophils Relative: 4 %
HCT: 45.1 % (ref 39.0–52.0)
HEMOGLOBIN: 14.7 g/dL (ref 13.0–17.0)
Immature Granulocytes: 0 %
LYMPHS PCT: 41 %
Lymphs Abs: 2.1 10*3/uL (ref 0.7–4.0)
MCH: 25.5 pg — ABNORMAL LOW (ref 26.0–34.0)
MCHC: 32.6 g/dL (ref 30.0–36.0)
MCV: 78.2 fL — ABNORMAL LOW (ref 80.0–100.0)
Monocytes Absolute: 0.3 10*3/uL (ref 0.1–1.0)
Monocytes Relative: 6 %
NEUTROS ABS: 2.4 10*3/uL (ref 1.7–7.7)
Neutrophils Relative %: 48 %
Platelets: 155 10*3/uL (ref 150–400)
RBC: 5.77 MIL/uL (ref 4.22–5.81)
RDW: 15 % (ref 11.5–15.5)
WBC: 5 10*3/uL (ref 4.0–10.5)
nRBC: 0 % (ref 0.0–0.2)

## 2018-08-16 LAB — COMPREHENSIVE METABOLIC PANEL
ALT: 16 U/L (ref 0–44)
AST: 17 U/L (ref 15–41)
Albumin: 4.2 g/dL (ref 3.5–5.0)
Alkaline Phosphatase: 50 U/L (ref 38–126)
Anion gap: 7 (ref 5–15)
BUN: 10 mg/dL (ref 6–20)
CO2: 26 mmol/L (ref 22–32)
Calcium: 8.9 mg/dL (ref 8.9–10.3)
Chloride: 105 mmol/L (ref 98–111)
Creatinine, Ser: 0.97 mg/dL (ref 0.61–1.24)
GFR calc Af Amer: >60 mL/min (ref >60)
GFR calc non Af Amer: >60 mL/min (ref >60)
Glucose, Bld: 145 mg/dL — ABNORMAL HIGH (ref 70–99)
Potassium: 3.9 mmol/L (ref 3.5–5.1)
Sodium: 138 mmol/L (ref 135–145)
Total Bilirubin: 0.7 mg/dL (ref 0.3–1.2)
Total Protein: 7.4 g/dL (ref 6.5–8.1)

## 2018-08-16 LAB — TSH: TSH: 2.314 u[IU]/mL (ref 0.350–4.500)

## 2018-08-16 MED ORDER — DIPHENHYDRAMINE HCL 50 MG/ML IJ SOLN
50.0000 mg | Freq: Once | INTRAMUSCULAR | Status: AC
  Administered 2018-08-16: 50 mg via INTRAVENOUS

## 2018-08-16 MED ORDER — SODIUM CHLORIDE 0.9 % IV SOLN
Freq: Once | INTRAVENOUS | Status: AC
  Administered 2018-08-16: 09:00:00 via INTRAVENOUS

## 2018-08-16 MED ORDER — SODIUM CHLORIDE 0.9 % IV SOLN
8.0000 mg/kg | Freq: Once | INTRAVENOUS | Status: AC
  Administered 2018-08-16: 800 mg via INTRAVENOUS
  Filled 2018-08-16: qty 50

## 2018-08-16 MED ORDER — DIPHENHYDRAMINE HCL 50 MG/ML IJ SOLN
INTRAMUSCULAR | Status: AC
  Filled 2018-08-16: qty 1

## 2018-08-16 MED ORDER — SODIUM CHLORIDE 0.9% FLUSH
10.0000 mL | INTRAVENOUS | Status: DC | PRN
  Administered 2018-08-16: 10 mL
  Filled 2018-08-16: qty 10

## 2018-08-16 MED ORDER — HEPARIN SOD (PORK) LOCK FLUSH 100 UNIT/ML IV SOLN
500.0000 [IU] | Freq: Once | INTRAVENOUS | Status: AC | PRN
  Administered 2018-08-16: 500 [IU]
  Filled 2018-08-16: qty 5

## 2018-08-16 NOTE — Progress Notes (Signed)
Labs reviewed today, they meet parameters for treatment. No new issues reported by patient. Proceed with treatment today.   Treatment given per orders. Patient tolerated it well without problems. Vitals stable and discharged home from clinic ambulatory. Follow up as scheduled.

## 2018-08-16 NOTE — Patient Instructions (Signed)
[facility] Discharge Instructions for Patients Receiving Chemotherapy   Beginning January 23rd 2017 lab work for the Cancer Center will be done in the  Main lab at [facility] on 1st floor. If you have a lab appointment with the Cancer Center please come in thru the  Main Entrance and check in at the main information desk   Today you received the following chemotherapy agents   To help prevent nausea and vomiting after your treatment, we encourage you to take your nausea medication     If you develop nausea and vomiting, or diarrhea that is not controlled by your medication, call the clinic.  The clinic phone number is [phone]. Office hours are Monday-Friday 8:30am-5:00pm.  BELOW ARE SYMPTOMS THAT SHOULD BE REPORTED IMMEDIATELY:  *FEVER GREATER THAN 101.0 F  *CHILLS WITH OR WITHOUT FEVER  NAUSEA AND VOMITING THAT IS NOT CONTROLLED WITH YOUR NAUSEA MEDICATION  *UNUSUAL SHORTNESS OF BREATH  *UNUSUAL BRUISING OR BLEEDING  TENDERNESS IN MOUTH AND THROAT WITH OR WITHOUT PRESENCE OF ULCERS  *URINARY PROBLEMS  *BOWEL PROBLEMS  UNUSUAL RASH Items with * indicate a potential emergency and should be followed up as soon as possible. If you have an emergency after office hours please contact your primary care physician or go to the nearest emergency department.  Please call the clinic during office hours if you have any questions or concerns.   You may also contact the Patient Navigator at [phone] should you have any questions or need assistance in obtaining follow up care.      Resources For Cancer Patients and their Caregivers ? American Cancer Society: Can assist with transportation, wigs, general needs, runs Look Good Feel Better.        1-888-227-6333 ? Cancer Care: Provides financial assistance, online support groups, medication/co-pay assistance.  1-800-813-HOPE (4673) ? [facility] Assists [place] cancer  patients and their families through emotional , educational and financial support.  336-427-4357 ? [place] DSS Where to apply for food stamps, Medicaid and utility assistance. 336-342-1394 ? RCATS: Transportation to medical appointments. 336-347-2287 ? Social Security Administration: May apply for disability if have a Stage IV cancer. 336-342-7796 1-800-772-1213 ? [place] [employer], Disability and Transit Services: Assists with nutrition, care and transit needs. 336-349-2343         

## 2018-08-19 ENCOUNTER — Other Ambulatory Visit (HOSPITAL_COMMUNITY): Payer: Self-pay | Admitting: *Deleted

## 2018-08-19 DIAGNOSIS — G6289 Other specified polyneuropathies: Secondary | ICD-10-CM

## 2018-08-19 DIAGNOSIS — C159 Malignant neoplasm of esophagus, unspecified: Secondary | ICD-10-CM

## 2018-08-19 MED ORDER — PROCHLORPERAZINE MALEATE 10 MG PO TABS: ORAL_TABLET | ORAL | 3 refills | Status: DC

## 2018-08-19 MED ORDER — GABAPENTIN 300 MG PO CAPS: 300.0000 mg | ORAL_CAPSULE | Freq: Two times a day (BID) | ORAL | 2 refills | Status: DC

## 2018-08-19 NOTE — Telephone Encounter (Signed)
REVIEWED-NO ADDITIONAL RECOMMENDATIONS. 

## 2018-08-23 ENCOUNTER — Ambulatory Visit (HOSPITAL_COMMUNITY)
Admission: RE | Admit: 2018-08-23 | Discharge: 2018-08-23 | Disposition: A | Discharge status: Home / Self Care | Payer: Medicaid Other | Source: Ambulatory Visit | Attending: Nurse Practitioner | Admitting: Nurse Practitioner

## 2018-08-23 ENCOUNTER — Encounter (HOSPITAL_COMMUNITY): Payer: Self-pay

## 2018-08-23 DIAGNOSIS — C159 Malignant neoplasm of esophagus, unspecified: Secondary | ICD-10-CM | POA: Diagnosis present

## 2018-08-23 MED ORDER — IOPAMIDOL (ISOVUE-300) INJECTION 61%
100.0000 mL | Freq: Once | INTRAVENOUS | Status: AC | PRN
  Administered 2018-08-23: 100 mL via INTRAVENOUS

## 2018-08-30 ENCOUNTER — Inpatient Hospital Stay (HOSPITAL_COMMUNITY): Payer: Medicaid Other

## 2018-08-30 ENCOUNTER — Encounter (HOSPITAL_COMMUNITY): Payer: Self-pay | Admitting: Hematology

## 2018-08-30 ENCOUNTER — Other Ambulatory Visit: Payer: Self-pay

## 2018-08-30 ENCOUNTER — Inpatient Hospital Stay (HOSPITAL_BASED_OUTPATIENT_CLINIC_OR_DEPARTMENT_OTHER): Payer: Medicaid Other | Admitting: Hematology

## 2018-08-30 VITALS — BP 126/80 | HR 69 | Temp 98.1°F | Resp 18

## 2018-08-30 VITALS — BP 140/91 | HR 79 | Temp 98.3°F | Resp 16 | Wt 209.0 lb

## 2018-08-30 DIAGNOSIS — G620 Drug-induced polyneuropathy: Secondary | ICD-10-CM

## 2018-08-30 DIAGNOSIS — C159 Malignant neoplasm of esophagus, unspecified: Secondary | ICD-10-CM

## 2018-08-30 DIAGNOSIS — Z5111 Encounter for antineoplastic chemotherapy: Secondary | ICD-10-CM

## 2018-08-30 DIAGNOSIS — Z87891 Personal history of nicotine dependence: Secondary | ICD-10-CM

## 2018-08-30 DIAGNOSIS — Z5112 Encounter for antineoplastic immunotherapy: Secondary | ICD-10-CM | POA: Diagnosis not present

## 2018-08-30 DIAGNOSIS — C155 Malignant neoplasm of lower third of esophagus: Secondary | ICD-10-CM | POA: Diagnosis not present

## 2018-08-30 DIAGNOSIS — C770 Secondary and unspecified malignant neoplasm of lymph nodes of head, face and neck: Secondary | ICD-10-CM | POA: Diagnosis not present

## 2018-08-30 DIAGNOSIS — R911 Solitary pulmonary nodule: Secondary | ICD-10-CM | POA: Diagnosis not present

## 2018-08-30 LAB — COMPREHENSIVE METABOLIC PANEL
ALT: 16 U/L (ref 0–44)
AST: 18 U/L (ref 15–41)
Albumin: 4.4 g/dL (ref 3.5–5.0)
Alkaline Phosphatase: 51 U/L (ref 38–126)
Anion gap: 8 (ref 5–15)
BUN: 10 mg/dL (ref 6–20)
CO2: 26 mmol/L (ref 22–32)
Calcium: 9.2 mg/dL (ref 8.9–10.3)
Chloride: 104 mmol/L (ref 98–111)
Creatinine, Ser: 1.12 mg/dL (ref 0.61–1.24)
GFR calc Af Amer: >60 mL/min (ref >60)
GFR calc non Af Amer: >60 mL/min (ref >60)
Glucose, Bld: 116 mg/dL — ABNORMAL HIGH (ref 70–99)
Potassium: 3.8 mmol/L (ref 3.5–5.1)
SODIUM: 138 mmol/L (ref 135–145)
Total Bilirubin: 0.5 mg/dL (ref 0.3–1.2)
Total Protein: 7.7 g/dL (ref 6.5–8.1)

## 2018-08-30 LAB — CBC WITH DIFFERENTIAL/PLATELET
Abs Immature Granulocytes: 0.01 10*3/uL (ref 0.00–0.07)
Basophils Absolute: 0.1 10*3/uL (ref 0.0–0.1)
Basophils Relative: 1 %
EOS ABS: 0.2 10*3/uL (ref 0.0–0.5)
Eosinophils Relative: 3 %
HCT: 43.8 % (ref 39.0–52.0)
Hemoglobin: 14.3 g/dL (ref 13.0–17.0)
Immature Granulocytes: 0 %
Lymphocytes Relative: 42 %
Lymphs Abs: 2.8 10*3/uL (ref 0.7–4.0)
MCH: 25.2 pg — ABNORMAL LOW (ref 26.0–34.0)
MCHC: 32.6 g/dL (ref 30.0–36.0)
MCV: 77.1 fL — ABNORMAL LOW (ref 80.0–100.0)
Monocytes Absolute: 0.4 10*3/uL (ref 0.1–1.0)
Monocytes Relative: 7 %
NEUTROS ABS: 3.2 10*3/uL (ref 1.7–7.7)
Neutrophils Relative %: 47 %
Platelets: 202 10*3/uL (ref 150–400)
RBC: 5.68 MIL/uL (ref 4.22–5.81)
RDW: 15.2 % (ref 11.5–15.5)
WBC: 6.7 10*3/uL (ref 4.0–10.5)
nRBC: 0 % (ref 0.0–0.2)

## 2018-08-30 LAB — URINALYSIS, DIPSTICK ONLY
Bilirubin Urine: NEGATIVE
Glucose, UA: NEGATIVE mg/dL
Hgb urine dipstick: NEGATIVE
Ketones, ur: NEGATIVE mg/dL
Leukocytes, UA: NEGATIVE
Nitrite: NEGATIVE
PROTEIN: NEGATIVE mg/dL
Specific Gravity, Urine: 1.027 (ref 1.005–1.030)
pH: 5 (ref 5.0–8.0)

## 2018-08-30 MED ORDER — DIPHENHYDRAMINE HCL 50 MG/ML IJ SOLN
50.0000 mg | Freq: Once | INTRAMUSCULAR | Status: AC
  Administered 2018-08-30: 50 mg via INTRAVENOUS
  Filled 2018-08-30: qty 1

## 2018-08-30 MED ORDER — SODIUM CHLORIDE 0.9 % IV SOLN
8.0000 mg/kg | Freq: Once | INTRAVENOUS | Status: AC
  Administered 2018-08-30: 800 mg via INTRAVENOUS
  Filled 2018-08-30: qty 50

## 2018-08-30 MED ORDER — SODIUM CHLORIDE 0.9 % IV SOLN
Freq: Once | INTRAVENOUS | Status: AC
  Administered 2018-08-30: 10:00:00 via INTRAVENOUS

## 2018-08-30 MED ORDER — SODIUM CHLORIDE 0.9% FLUSH
10.0000 mL | INTRAVENOUS | Status: DC | PRN
  Administered 2018-08-30: 10 mL
  Filled 2018-08-30: qty 10

## 2018-08-30 MED ORDER — HEPARIN SOD (PORK) LOCK FLUSH 100 UNIT/ML IV SOLN
500.0000 [IU] | Freq: Once | INTRAVENOUS | Status: AC | PRN
  Administered 2018-08-30: 500 [IU]
  Filled 2018-08-30: qty 5

## 2018-08-30 NOTE — Progress Notes (Signed)
Patient in room 412 and he is ready for treatment.

## 2018-08-30 NOTE — Progress Notes (Signed)
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 Barney [facility]. Partially obstructing, likely malignant esophageal tumor was found in the distal esophagus. Biopsied with surgical path demonstrating Adenocarcinoma. Injected. Treated with argon plasma coagulation (APC) #3. 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 Biopsied with surgical path demonstrating Adenocarcinoma.    01/19/2017 PET scan    NECK Two FDG avid nodes are seen in the base of the neck on the right. The first is seen on CT image 35, just posterior to the right thyroid lobe and the other is seen on CT image 41 measuring 16 mm in short axis with a maximum SUV of 8.6.  CHEST  The patient's known malignancy involving the distal esophagus and cardia of the stomach is FDG avid with a maximum SUV of 10.4. No definitive metastatic nodes within the chest.  ABDOMEN/PELVIS  FDG avid metastatic adenopathy is seen in the gastrohepatic ligament, paraceliac nodes, posterior to the IVC on image 111, and to the left of the SMA on image 112. The most inferior node is seen in the aortocaval region on image 117. The representative node to the left of the SMA was measured on series 4, image 111 measuring 2.5 by 2.1 cm with a maximum SUV of 7. No other FDG avid disease in the abdomen or pelvis. Cholelithiasis is identified.    02/02/2017 Procedure    US guided bx of right supraclavicular LN by IR.    02/05/2017 Pathology Results    Lymph node, needle/core biopsy, Right Supraclavicular -  METASTATIC POORLY DIFFERENTIATED CARCINOMA.    02/10/2017 Pathology Results    HER2 - NEGATIVE    02/13/2017 Procedure    Port placed by IR    02/15/2017 - 04/25/2017 Chemotherapy    FOLFOX x 6 cycles     05/02/2017 Imaging    CT C/A/P: IMPRESSION: 1. Nodular thickening and hyperenhancement in the distal esophagus and gastric cardia could reflect residual tumor. 2. No significant change in size of previously demonstrated hypermetabolic upper abdominal lymph nodes. No progressive adenopathy identified. No residual enlarged supraclavicular nodes are seen. 3. No evidence of progressive metastatic disease. No worrisome hepatic findings. 4. Tiny right upper lobe pulmonary nodule, not clearly seen on low dose previous PET-CT. Attention on follow-up recommended. 5. Cholelithiasis.      [facility] STAGING: [facility] Staging Primary esophageal adenocarcinoma [facility]) Staging form: Esophagus - Adenocarcinoma, AJCC 8th Edition - Clinical stage from 02/11/2017: Stage IVA (cTX, cN3, cM0) - Signed by Maxwell Cancer, PA-C on 02/11/2017    INTERVAL HISTORY:  Ms. [facility] 53 y.o. adult returns for routine follow-up for esophageal adenocarcinoma. He is here today with his wife and he is doing well. He does report his neuropathy is slightly worse in his feet. Denies any nausea, vomiting, or diarrhea. Denies any new pains. Had not noticed any recent bleeding such as epistaxis, hematuria or hematochezia. Denies recent chest pain on exertion, shortness of breath on minimal exertion, pre-syncopal episodes, or palpitations. Denies any recent fevers, infections, or recent hospitalizations. Patient reports appetite at 100% and energy level at  75%.   REVIEW OF SYSTEMS:  Review of Systems  Neurological: Positive for numbness.  All other systems reviewed and are negative.    PAST MEDICAL/SURGICAL HISTORY:  Past Medical History:  Diagnosis Date  . Hypertension   . Iron deficiency anemia due to chronic blood loss  02/28/2017  . Primary esophageal adenocarcinoma ([place]) 01/29/2017   Past Surgical History:  Procedure Laterality Date  . ESOPHAGOGASTRODUODENOSCOPY (EGD) WITH PROPOFOL N/A 01/16/2017   Procedure: ESOPHAGOGASTRODUODENOSCOPY (EGD) WITH PROPOFOL;  Surgeon: [name], MD;  Location: [facility];  Service: Endoscopy;  Laterality: N/A;  730   . IR FLUORO GUIDE PORT INSERTION RIGHT  02/13/2017  . IR US GUIDE VASC ACCESS RIGHT  02/13/2017  . lipoma removal    . PORTA CATH INSERTION Right 02/13/2017  . SAVORY DILATION N/A 01/16/2017   Procedure: SAVORY DILATION;  Surgeon: [name], MD;  Location: [facility];  Service: Endoscopy;  Laterality: N/A;     SOCIAL HISTORY:  Social History   Socioeconomic History  . Marital status: Married    Spouse name: Not on file  . Number of children: Not on file  . Years of education: Not on file  . Highest education level: Not on file  Occupational History  . Not on file  Social Needs  . Financial resource strain: Not on file  . Food insecurity:    Worry: Not on file    Inability: Not on file  . Transportation needs:    Medical: Not on file    Non-medical: Not on file  Tobacco Use  . Smoking status: Former Smoker    Packs/day: 0.25    Types: Cigarettes  . Smokeless tobacco: Never Used  Substance and Sexual Activity  . Alcohol use: No    Comment: hx heavy alcohol, hasn't drank in 19 yrs  . Drug use: Yes    Types: Marijuana    Comment: history of marijuana   . Sexual activity: Yes  Lifestyle  . Physical activity:    Days per week: Not on file    Minutes per session: Not on file  . Stress: Not on file  Relationships  . Social connections:    Talks on phone: Not on file    Gets together: Not on file    Attends religious service: Not on file    Active member of club or organization: Not on file    Attends meetings of clubs or organizations: Not on file    Relationship status: Not on file  . Intimate partner violence:    Fear of  current or ex partner: Not on file    Emotionally abused: Not on file    Physically abused: Not on file    Forced sexual activity: Not on file  Other Topics Concern  . Not on file  Social History Narrative   WORKING [employer].    FAMILY HISTORY:  Family History  Problem Relation Age of Onset  . Prostate Henderson Father   . Colon [facility] Neg Hx   . Colon polyps Neg Hx     CURRENT MEDICATIONS:  Outpatient Encounter Medications as of 08/30/2018  Medication Sig  . acetaminophen (TYLENOL) 325 MG tablet Take 2 tablets (650 mg total) by mouth every 4 (four) hours as needed for headache.  . Alpha-Lipoic Acid 600 MG CAPS Take 1 capsule (600 mg total) by mouth daily.  Marland Kitchen amLODipine (NORVASC) 10 MG tablet TAKE 1 TABLET BY MOUTH ONCE DAILY  .  B Complex-C (B-COMPLEX WITH VITAMIN C) tablet Take 1 tablet by mouth daily.  Marland Kitchen gabapentin (NEURONTIN) 300 MG capsule Take 1 capsule (300 mg total) by mouth 2 (two) times daily.  Marland Kitchen lidocaine-prilocaine (EMLA) cream Apply 1 application topically as needed.  . loratadine (CLARITIN) 10 MG tablet Take 1 tablet (10 mg total) by mouth daily.  . Misc. Devices MISC Please provide the patient with the [facility] HPB cough Syrup. 1 teaspoonful every 4-6 hours PRN. Please provide the patient with 360 ml.  . Morphine Sulfate (MORPHINE CONCENTRATE) 10 mg / 0.5 ml concentrated solution Take 0.5 mLs (10 mg total) by mouth every 4 (four) hours as needed for severe pain.  . pantoprazole (PROTONIX) 40 MG tablet Take 1 tablet (40 mg total) by mouth 2 (two) times daily. Take 30 minutes before breakfast  . prochlorperazine (COMPAZINE) 10 MG tablet TAKE 1 TABLET BY MOUTH EVERY 6 HOURS AS NEEDED FOR NAUSEA AND VOMITING  . Ramucirumab (CYRAMZA IV) Inject into the vein every 14 (fourteen) days. Fridays of every other week  . ranitidine (ZANTAC) 300 MG tablet Take 1 tablet (300 mg total) by mouth at bedtime.   No facility-administered encounter medications on file as  of 08/30/2018.     ALLERGIES:  No Known Allergies   PHYSICAL EXAM:  ECOG Performance status: 1  Vitals:   08/30/18 0900  BP: (!) 140/91  Pulse: 79  Resp: 16  Temp: 98.3 F (36.8 C)  SpO2: 100%   Filed Weights   08/30/18 0900  Weight: 209 lb (94.8 kg)    Physical Exam Constitutional:      Appearance: Normal appearance. She is normal weight.  Musculoskeletal: Normal range of motion.  Skin:    General: Skin is warm and dry.  Neurological:     Mental Status: She is alert and oriented to person, place, and time. Mental status is at baseline.  Psychiatric:        Mood and Affect: Mood normal.        Behavior: Behavior normal.        Thought Content: Thought content normal.        Judgment: Judgment normal.      LABORATORY DATA:  I have reviewed the labs as listed.  CBC    Component Value Date/Time   WBC 6.7 08/30/2018 0806   RBC 5.68 08/30/2018 0806   HGB 14.3 08/30/2018 0806   HCT 43.8 08/30/2018 0806   PLT 202 08/30/2018 0806   MCV 77.1 (L) 08/30/2018 0806   MCH 25.2 (L) 08/30/2018 0806   MCHC 32.6 08/30/2018 0806   RDW 15.2 08/30/2018 0806   LYMPHSABS 2.8 08/30/2018 0806   MONOABS 0.4 08/30/2018 0806   EOSABS 0.2 08/30/2018 0806   BASOSABS 0.1 08/30/2018 0806   CMP Latest Ref Rng & Units 08/30/2018 08/16/2018 08/02/2018  Glucose 70 - 99 mg/dL 116(H) 145(H) 123(H)  BUN 6 - 20 mg/dL _0 Creatinine 0.61 - 1.24 mg/dL 1.12 0.97 0.99  Sodium 135 - 145 mmol/L 138 138 139  Potassium 3.5 - 5.1 mmol/L 3.8 3.9 4.3  Chloride 98 - 111 mmol/L 104 105 107  CO2 22 - 32 mmol/L _1 Calcium 8.9 - 10.3 mg/dL 9.2 8.9 8.8(L)  Total Protein 6.5 - 8.1 g/dL 7.7 7.4 7.1  Total Bilirubin 0.3 - 1.2 mg/dL 0.5 0.7 0.1(L)  Alkaline Phos 38 - 126 U/L 51 50 62  AST 15 - 41 U/L _2 ALT 0 - 44  U/L _0 DIAGNOSTIC IMAGING:  I have independently reviewed the scans and discussed with the patient.   I have reviewed Francene Finders, NP's note and agree  with the documentation.  I personally performed a face-to-face visit, made revisions and my assessment and plan is as follows.    ASSESSMENT & PLAN:   Primary esophageal adenocarcinoma ([employer]) 1.  Stage IV GE junction adenocarcinoma: - 6 cycles of FOLFOX from 02/15/2017 through 04/25/2017 -Paclitaxel and Cyramza from 05/18/2017, paclitaxel discontinued on 09/21/2017 secondary to neuropathy - Foundation 1 CDX showing MS-stable, TMB 3Muts/mb - CT scan on 11/28/2017 showing 4 mm right lower lobe nodule which is new, with stable disease elsewhere. - [facility] to the [facility] from 12/10/2017 through 12/12/2017 with pneumonia from parainfluenza 3. -Last Cyramza was given on 03/22/2018.  His renal function normalized today. - Last CT scan on 03/22/2018 showed resolution of the right supraclavicular adenopathy and mediastinal adenopathy.  Residual soft tissue thickening involving the distal GE junction with no obvious mass.  Largely resolved upper abdominal adenopathy.  There is a persistent partially necrotic celiac axis lymph node which is stable.  No pulmonary metastatic disease. -He is continuing to tolerate Cyramza every 2 weeks very well.  He denies any bleeding problems. -We discussed the results of the CT scan dated 08/23/2018 which shows necrotic nodes with peripheral rim enhancement.  Largest measures 19 mm, increased from 18 mm previously.  There is another lymph node which increased to 12 mm from 8 mm previously.  No new lesions were seen. -I have recommended continuation of Cyramza at this time. -I plan to repeat CT scan in 4 to 5 months.  He will come back in 6 weeks for follow-up.  2.  Peripheral neuropathy: -He has bilateral toe numbness which is more or less stable.  Taxol was discontinued on 09/21/2017.  He takes gabapentin 3 times a day.   3.  High risk drug monitoring: -UA did not show proteinuria.  He may proceed with treatment.        Orders placed this encounter:  Orders Placed This  Encounter  Procedures  . TSH  . CBC with Differential/Platelet  . Comprehensive metabolic panel  . Urinalysis, Routine w reflex microscopic      Derek Jack, MD [place] [phone]

## 2018-08-30 NOTE — Patient Instructions (Signed)
[facility] Discharge Instructions for Patients Receiving Chemotherapy   Beginning January 23rd 2017 lab work for the [facility] will be done in the  Main lab at [facility] on 1st floor. If you have a lab appointment with the [facility] please come in thru the  Main Entrance and check in at the main information desk   Today you received the following chemotherapy agents Cyramza.Follow-up as scheduled. Call clinic for any questions or concerns  To help prevent nausea and vomiting after your treatment, we encourage you to take your nausea medication   If you develop nausea and vomiting, or diarrhea that is not controlled by your medication, call the clinic.  The clinic phone number is [phone]. Office hours are Monday-Friday 8:30am-5:00pm.  BELOW ARE SYMPTOMS THAT SHOULD BE REPORTED IMMEDIATELY:  *FEVER GREATER THAN 101.0 F  *CHILLS WITH OR WITHOUT FEVER  NAUSEA AND VOMITING THAT IS NOT CONTROLLED WITH YOUR NAUSEA MEDICATION  *UNUSUAL SHORTNESS OF BREATH  *UNUSUAL BRUISING OR BLEEDING  TENDERNESS IN MOUTH AND THROAT WITH OR WITHOUT PRESENCE OF ULCERS  *URINARY PROBLEMS  *BOWEL PROBLEMS  UNUSUAL RASH Items with * indicate a potential emergency and should be followed up as soon as possible. If you have an emergency after office hours please contact your primary care physician or go to the nearest emergency department.  Please call the clinic during office hours if you have any questions or concerns.   You may also contact the Patient Navigator at [phone] should you have any questions or need assistance in obtaining follow up care.      Resources For Cancer Patients and their Caregivers ? American Cancer Society: Can assist with transportation, wigs, general needs, runs Look Good Feel Better.        [phone] ? Cancer Care: Provides financial assistance, online support groups, medication/co-pay assistance.  1-800-813-HOPE  717-561-7473) ? [facility] Assists [place] cancer patients and their families through emotional , educational and financial support.  225-879-4946 ? [place] DSS Where to apply for food stamps, Medicaid and utility assistance. 317-718-7670 ? RCATS: Transportation to medical appointments. 7262159011 ? Social Security Administration: May apply for disability if have a Stage IV cancer. [phone] [phone])[phone] ? [employer], Disability and Transit Services: Assists with nutrition, care and transit needs. [phone]

## 2018-08-30 NOTE — Assessment & Plan Note (Signed)
1.  Stage IV GE junction adenocarcinoma: - 6 cycles of FOLFOX from 02/15/2017 through 04/25/2017 -Paclitaxel and Cyramza from 05/18/2017, paclitaxel discontinued on 09/21/2017 secondary to neuropathy - Foundation 1 CDX showing MS-stable, TMB 3Muts/mb - CT scan on 11/28/2017 showing 4 mm right lower lobe nodule which is new, with stable disease elsewhere. - He was admitted to the hospital from 12/10/2017 through 12/12/2017 with pneumonia from parainfluenza 3. -Last Cyramza was given on 03/22/2018.  His renal function normalized today. - Last CT scan on 03/22/2018 showed resolution of the right supraclavicular adenopathy and mediastinal adenopathy.  Residual soft tissue thickening involving the distal GE junction with no obvious mass.  Largely resolved upper abdominal adenopathy.  There is a persistent partially necrotic celiac axis lymph node which is stable.  No pulmonary metastatic disease. -He is continuing to tolerate Cyramza every 2 weeks very well.  He denies any bleeding problems. -We discussed the results of the CT scan dated 08/23/2018 which shows necrotic nodes with peripheral rim enhancement.  Largest measures 19 mm, increased from 18 mm previously.  There is another lymph node which increased to 12 mm from 8 mm previously.  No new lesions were seen. -I have recommended continuation of Cyramza at this time. -I plan to repeat CT scan in 4 to 5 months.  He will come back in 6 weeks for follow-up.  2.  Peripheral neuropathy: -He has bilateral toe numbness which is more or less stable.  Taxol was discontinued on 09/21/2017.  He takes gabapentin 3 times a day.   3.  High risk drug monitoring: -UA did not show proteinuria.  He may proceed with treatment.

## 2018-08-30 NOTE — Progress Notes (Signed)
0950 Progress note from NP and lab/urine results reviewed and pt approved for chemo tx today per MD                                                        Maxwell [facility] tolerated Cyramza infusion well without complaints or incident. VSS upon discharge. Pt discharged self ambulatory in satisfactory condition

## 2018-08-30 NOTE — Patient Instructions (Signed)
[facility] Discharge Instructions     Thank you for choosing [facility] at [facility] to provide your oncology and hematology care.  To afford each patient quality time with our provider, please arrive at least 15 minutes before your scheduled appointment time.   If you have a lab appointment with the Cancer Center please come in thru the  Main Entrance and check in at the main information desk  You need to re-schedule your appointment should you arrive 10 or more minutes late.  We strive to give you quality time with our providers, and arriving late affects you and other patients whose appointments are after yours.  Also, if you no show three or more times for appointments you may be dismissed from the clinic at the providers discretion.     Again, thank you for choosing [facility].  Our hope is that these requests will decrease the amount of time that you wait before being seen by our physicians.       _____________________________________________________________  Should you have questions after your visit to [facility], please contact our office at [phone] between the hours of 8:00 a.m. and 4:30 p.m.  Voicemails left after 4:00 p.m. will not be returned until the following business day.  For prescription refill requests, have your pharmacy contact our office and allow 72 hours.    Cancer Center Support Programs:   > Cancer Support Group  2nd Tuesday of the month 1pm-2pm, Journey Room    

## 2018-09-13 ENCOUNTER — Inpatient Hospital Stay (HOSPITAL_COMMUNITY): Payer: Medicaid Other

## 2018-09-13 ENCOUNTER — Encounter (HOSPITAL_COMMUNITY): Payer: Self-pay

## 2018-09-13 VITALS — BP 132/84 | HR 77 | Temp 98.5°F | Resp 18 | Wt 213.6 lb

## 2018-09-13 DIAGNOSIS — Z5112 Encounter for antineoplastic immunotherapy: Secondary | ICD-10-CM | POA: Diagnosis not present

## 2018-09-13 DIAGNOSIS — Z5111 Encounter for antineoplastic chemotherapy: Secondary | ICD-10-CM

## 2018-09-13 DIAGNOSIS — C159 Malignant neoplasm of esophagus, unspecified: Secondary | ICD-10-CM

## 2018-09-13 LAB — COMPREHENSIVE METABOLIC PANEL
ALT: 16 U/L (ref 0–44)
AST: 20 U/L (ref 15–41)
Albumin: 4.2 g/dL (ref 3.5–5.0)
Alkaline Phosphatase: 52 U/L (ref 38–126)
Anion gap: 6 (ref 5–15)
BILIRUBIN TOTAL: 0.5 mg/dL (ref 0.3–1.2)
BUN: 9 mg/dL (ref 6–20)
CO2: 27 mmol/L (ref 22–32)
Calcium: 8.9 mg/dL (ref 8.9–10.3)
Chloride: 104 mmol/L (ref 98–111)
Creatinine, Ser: 1.01 mg/dL (ref 0.61–1.24)
GFR calc Af Amer: >60 mL/min (ref >60)
GFR calc non Af Amer: >60 mL/min (ref >60)
Glucose, Bld: 166 mg/dL — ABNORMAL HIGH (ref 70–99)
Potassium: 3.9 mmol/L (ref 3.5–5.1)
Sodium: 137 mmol/L (ref 135–145)
Total Protein: 7.5 g/dL (ref 6.5–8.1)

## 2018-09-13 LAB — CBC WITH DIFFERENTIAL/PLATELET
Abs Immature Granulocytes: 0.01 10*3/uL (ref 0.00–0.07)
Basophils Absolute: 0 10*3/uL (ref 0.0–0.1)
Basophils Relative: 1 %
Eosinophils Absolute: 0.3 10*3/uL (ref 0.0–0.5)
Eosinophils Relative: 5 %
HCT: 41.9 % (ref 39.0–52.0)
Hemoglobin: 13.5 g/dL (ref 13.0–17.0)
Immature Granulocytes: 0 %
LYMPHS PCT: 42 %
Lymphs Abs: 2.3 10*3/uL (ref 0.7–4.0)
MCH: 24.9 pg — ABNORMAL LOW (ref 26.0–34.0)
MCHC: 32.2 g/dL (ref 30.0–36.0)
MCV: 77.3 fL — AB (ref 80.0–100.0)
Monocytes Absolute: 0.5 10*3/uL (ref 0.1–1.0)
Monocytes Relative: 9 %
Neutro Abs: 2.4 10*3/uL (ref 1.7–7.7)
Neutrophils Relative %: 43 %
Platelets: 142 10*3/uL — ABNORMAL LOW (ref 150–400)
RBC: 5.42 MIL/uL (ref 4.22–5.81)
RDW: 15 % (ref 11.5–15.5)
WBC: 5.4 10*3/uL (ref 4.0–10.5)
nRBC: 0 % (ref 0.0–0.2)

## 2018-09-13 LAB — URINALYSIS, DIPSTICK ONLY
Glucose, UA: NEGATIVE mg/dL
Hgb urine dipstick: NEGATIVE
Ketones, ur: 5 mg/dL — AB
Leukocytes, UA: NEGATIVE
Nitrite: NEGATIVE
Protein, ur: NEGATIVE mg/dL
SPECIFIC GRAVITY, URINE: 1.025 (ref 1.005–1.030)
pH: 5 (ref 5.0–8.0)

## 2018-09-13 MED ORDER — SODIUM CHLORIDE 0.9% FLUSH
10.0000 mL | INTRAVENOUS | Status: DC | PRN
  Administered 2018-09-13: 10 mL
  Filled 2018-09-13: qty 10

## 2018-09-13 MED ORDER — SODIUM CHLORIDE 0.9 % IV SOLN
8.0000 mg/kg | Freq: Once | INTRAVENOUS | Status: AC
  Administered 2018-09-13: 800 mg via INTRAVENOUS
  Filled 2018-09-13: qty 50

## 2018-09-13 MED ORDER — DIPHENHYDRAMINE HCL 50 MG/ML IJ SOLN
50.0000 mg | Freq: Once | INTRAMUSCULAR | Status: AC
  Administered 2018-09-13: 50 mg via INTRAVENOUS

## 2018-09-13 MED ORDER — SODIUM CHLORIDE 0.9 % IV SOLN
Freq: Once | INTRAVENOUS | Status: AC
  Administered 2018-09-13: 10:00:00 via INTRAVENOUS

## 2018-09-13 MED ORDER — HEPARIN SOD (PORK) LOCK FLUSH 100 UNIT/ML IV SOLN
500.0000 [IU] | Freq: Once | INTRAVENOUS | Status: AC | PRN
  Administered 2018-09-13: 500 [IU]

## 2018-09-13 MED ORDER — DIPHENHYDRAMINE HCL 50 MG/ML IJ SOLN
INTRAMUSCULAR | Status: AC
  Filled 2018-09-13: qty 1

## 2018-09-13 NOTE — Patient Instructions (Signed)
[facility] Discharge Instructions for Patients Receiving Chemotherapy   Beginning January 23rd 2017 lab work for the [facility] will be done in the  Main lab at [facility] on 1st floor. If you have a lab appointment with the [facility] please come in thru the  Main Entrance and check in at the main information desk   Today you received the following chemotherapy agents Cyramza. Follow-up as scheduled. Call clinic for any questions or concerns  To help prevent nausea and vomiting after your treatment, we encourage you to take your nausea medication   If you develop nausea and vomiting, or diarrhea that is not controlled by your medication, call the clinic.  The clinic phone number is [phone]. Office hours are Monday-Friday 8:30am-5:00pm.  BELOW ARE SYMPTOMS THAT SHOULD BE REPORTED IMMEDIATELY:  *FEVER GREATER THAN 101.0 F  *CHILLS WITH OR WITHOUT FEVER  NAUSEA AND VOMITING THAT IS NOT CONTROLLED WITH YOUR NAUSEA MEDICATION  *UNUSUAL SHORTNESS OF BREATH  *UNUSUAL BRUISING OR BLEEDING  TENDERNESS IN MOUTH AND THROAT WITH OR WITHOUT PRESENCE OF ULCERS  *URINARY PROBLEMS  *BOWEL PROBLEMS  UNUSUAL RASH Items with * indicate a potential emergency and should be followed up as soon as possible. If you have an emergency after office hours please contact your primary care physician or go to the nearest emergency department.  Please call the clinic during office hours if you have any questions or concerns.   You may also contact the Patient Navigator at [phone] should you have any questions or need assistance in obtaining follow up care.      Resources For Cancer Patients and their Caregivers ? American Cancer Society: Can assist with transportation, wigs, general needs, runs Look Good Feel Better.        ([phone] ? Cancer Care: Provides financial assistance, online support groups, medication/co-pay assistance.  1-800-813-HOPE  351-477-1006) ? [facility] Assists [place] cancer patients and their families through emotional , educational and financial support.  ([phone] ? [place] DSS Where to apply for food stamps, Medicaid and utility assistance. 6080684164 ? RCATS: Transportation to medical appointments. 267-667-8514 ? Social Security Administration: May apply for disability if have a Stage IV cancer. [phone] ? [employer], Disability and Transit Services: Assists with nutrition, care and transit needs. [phone]

## 2018-09-13 NOTE — Progress Notes (Signed)
4388 Lab and urine results reviewed with Dr. Delton Coombes and pt approved for Cyramza infusion today per MD.                                                             Maxwell [facility] tolerated Cyramza infusion well without complaints or incident. VSS upon discharge. Pt discharged self ambulatory in satisfactory condition accompanied by his wife

## 2018-09-16 ENCOUNTER — Other Ambulatory Visit (HOSPITAL_COMMUNITY): Payer: Self-pay | Admitting: Nurse Practitioner

## 2018-09-16 ENCOUNTER — Other Ambulatory Visit (HOSPITAL_COMMUNITY): Payer: Self-pay | Admitting: *Deleted

## 2018-09-16 DIAGNOSIS — C159 Malignant neoplasm of esophagus, unspecified: Secondary | ICD-10-CM

## 2018-09-27 ENCOUNTER — Inpatient Hospital Stay (HOSPITAL_COMMUNITY): Payer: Medicaid Other | Attending: Hematology | Admitting: Hematology

## 2018-09-27 ENCOUNTER — Encounter (HOSPITAL_COMMUNITY): Payer: Self-pay | Admitting: Hematology

## 2018-09-27 ENCOUNTER — Inpatient Hospital Stay (HOSPITAL_COMMUNITY): Payer: Medicaid Other

## 2018-09-27 ENCOUNTER — Other Ambulatory Visit: Payer: Self-pay

## 2018-09-27 VITALS — BP 136/85 | HR 77 | Temp 98.6°F | Resp 16

## 2018-09-27 DIAGNOSIS — C159 Malignant neoplasm of esophagus, unspecified: Secondary | ICD-10-CM

## 2018-09-27 DIAGNOSIS — C155 Malignant neoplasm of lower third of esophagus: Secondary | ICD-10-CM

## 2018-09-27 DIAGNOSIS — R059 Cough, unspecified: Secondary | ICD-10-CM

## 2018-09-27 DIAGNOSIS — Z5112 Encounter for antineoplastic immunotherapy: Secondary | ICD-10-CM | POA: Insufficient documentation

## 2018-09-27 DIAGNOSIS — C770 Secondary and unspecified malignant neoplasm of lymph nodes of head, face and neck: Secondary | ICD-10-CM | POA: Diagnosis not present

## 2018-09-27 DIAGNOSIS — Z87891 Personal history of nicotine dependence: Secondary | ICD-10-CM | POA: Diagnosis not present

## 2018-09-27 DIAGNOSIS — R911 Solitary pulmonary nodule: Secondary | ICD-10-CM | POA: Diagnosis not present

## 2018-09-27 DIAGNOSIS — R05 Cough: Secondary | ICD-10-CM

## 2018-09-27 DIAGNOSIS — Z79899 Other long term (current) drug therapy: Secondary | ICD-10-CM | POA: Diagnosis not present

## 2018-09-27 DIAGNOSIS — G620 Drug-induced polyneuropathy: Secondary | ICD-10-CM | POA: Insufficient documentation

## 2018-09-27 LAB — CBC WITH DIFFERENTIAL/PLATELET
Abs Immature Granulocytes: 0.02 10*3/uL (ref 0.00–0.07)
Basophils Absolute: 0.1 10*3/uL (ref 0.0–0.1)
Basophils Relative: 1 %
Eosinophils Absolute: 0.3 10*3/uL (ref 0.0–0.5)
Eosinophils Relative: 3 %
HCT: 44.3 % (ref 39.0–52.0)
Hemoglobin: 14.2 g/dL (ref 13.0–17.0)
Immature Granulocytes: 0 %
Lymphocytes Relative: 26 %
Lymphs Abs: 2.7 10*3/uL (ref 0.7–4.0)
MCH: 25 pg — AB (ref 26.0–34.0)
MCHC: 32.1 g/dL (ref 30.0–36.0)
MCV: 78 fL — ABNORMAL LOW (ref 80.0–100.0)
MONO ABS: 0.8 10*3/uL (ref 0.1–1.0)
Monocytes Relative: 8 %
Neutro Abs: 6.4 10*3/uL (ref 1.7–7.7)
Neutrophils Relative %: 62 %
Platelets: 183 10*3/uL (ref 150–400)
RBC: 5.68 MIL/uL (ref 4.22–5.81)
RDW: 15.2 % (ref 11.5–15.5)
WBC: 10.3 10*3/uL (ref 4.0–10.5)
nRBC: 0 % (ref 0.0–0.2)

## 2018-09-27 LAB — COMPREHENSIVE METABOLIC PANEL
ALT: 14 U/L (ref 0–44)
AST: 19 U/L (ref 15–41)
Albumin: 4.5 g/dL (ref 3.5–5.0)
Alkaline Phosphatase: 58 U/L (ref 38–126)
Anion gap: 9 (ref 5–15)
BUN: 10 mg/dL (ref 6–20)
CO2: 25 mmol/L (ref 22–32)
Calcium: 9.2 mg/dL (ref 8.9–10.3)
Chloride: 103 mmol/L (ref 98–111)
Creatinine, Ser: 1.04 mg/dL (ref 0.61–1.24)
GFR calc Af Amer: >60 mL/min (ref >60)
GFR calc non Af Amer: >60 mL/min (ref >60)
Glucose, Bld: 146 mg/dL — ABNORMAL HIGH (ref 70–99)
Potassium: 3.8 mmol/L (ref 3.5–5.1)
SODIUM: 137 mmol/L (ref 135–145)
Total Bilirubin: 0.7 mg/dL (ref 0.3–1.2)
Total Protein: 8.1 g/dL (ref 6.5–8.1)

## 2018-09-27 LAB — TSH: TSH: 1.583 u[IU]/mL (ref 0.350–4.500)

## 2018-09-27 MED ORDER — MISC. DEVICES MISC: 0 refills | Status: DC

## 2018-09-27 MED ORDER — SODIUM CHLORIDE 0.9 % IV SOLN
Freq: Once | INTRAVENOUS | Status: AC
  Administered 2018-09-27: 11:00:00 via INTRAVENOUS

## 2018-09-27 MED ORDER — DIPHENHYDRAMINE HCL 50 MG/ML IJ SOLN
50.0000 mg | Freq: Once | INTRAMUSCULAR | Status: AC
  Administered 2018-09-27: 50 mg via INTRAVENOUS
  Filled 2018-09-27: qty 1

## 2018-09-27 MED ORDER — HEPARIN SOD (PORK) LOCK FLUSH 100 UNIT/ML IV SOLN
500.0000 [IU] | Freq: Once | INTRAVENOUS | Status: AC | PRN
  Administered 2018-09-27: 500 [IU]
  Filled 2018-09-27: qty 5

## 2018-09-27 MED ORDER — SODIUM CHLORIDE 0.9 % IV SOLN
8.0000 mg/kg | Freq: Once | INTRAVENOUS | Status: AC
  Administered 2018-09-27: 800 mg via INTRAVENOUS
  Filled 2018-09-27: qty 50

## 2018-09-27 MED ORDER — SODIUM CHLORIDE 0.9% FLUSH
10.0000 mL | INTRAVENOUS | Status: DC | PRN
  Administered 2018-09-27: 10 mL
  Filled 2018-09-27: qty 10

## 2018-09-27 NOTE — Patient Instructions (Signed)
[facility] Discharge Instructions for Patients Receiving Chemotherapy  Today you received the following chemotherapy agents  If you develop nausea and vomiting that is not controlled by your nausea medication, call the clinic.   BELOW ARE SYMPTOMS THAT SHOULD BE REPORTED IMMEDIATELY:  *FEVER GREATER THAN 100.5 F  *CHILLS WITH OR WITHOUT FEVER  NAUSEA AND VOMITING THAT IS NOT CONTROLLED WITH YOUR NAUSEA MEDICATION  *UNUSUAL SHORTNESS OF BREATH  *UNUSUAL BRUISING OR BLEEDING  TENDERNESS IN MOUTH AND THROAT WITH OR WITHOUT PRESENCE OF ULCERS  *URINARY PROBLEMS  *BOWEL PROBLEMS  UNUSUAL RASH Items with * indicate a potential emergency and should be followed up as soon as possible.  Feel free to call the clinic should you have any questions or concerns. The clinic phone number is [phone].  Please show the CHEMO ALERT CARD at check-in to the Emergency Department and triage nurse.   

## 2018-09-27 NOTE — Progress Notes (Signed)
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 Barney Drain. Partially obstructing, likely malignant esophageal tumor was found in the distal esophagus. Biopsied with surgical path demonstrating Adenocarcinoma. Injected. Treated with argon plasma coagulation (APC) #3. 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 Biopsied with surgical path demonstrating Adenocarcinoma.    01/19/2017 PET scan    NECK Two FDG avid nodes are seen in the base of the neck on the right. The first is seen on CT image 35, just posterior to the right thyroid lobe and the other is seen on CT image 41 measuring 16 mm in short axis with a maximum SUV of 8.6.  CHEST  The patient's known malignancy involving the distal esophagus and cardia of the stomach is FDG avid with a maximum SUV of 10.4. No definitive metastatic nodes within the chest.  ABDOMEN/PELVIS  FDG avid metastatic adenopathy is seen in the gastrohepatic ligament, paraceliac nodes, posterior to the IVC on image 111, and to the left of the SMA on image 112. The most inferior node is seen in the aortocaval region on image 117. The representative node to the left of the SMA was measured on series 4, image 111 measuring 2.5 by 2.1 cm with a maximum SUV of 7. No other FDG avid disease in the abdomen or pelvis. Cholelithiasis is identified.    02/02/2017 Procedure    US guided bx of right supraclavicular LN by IR.    02/05/2017 Pathology Results    Lymph node, needle/core biopsy, Right Supraclavicular -  METASTATIC POORLY DIFFERENTIATED CARCINOMA.    02/10/2017 Pathology Results    HER2 - NEGATIVE    02/13/2017 Procedure    Port placed by IR    02/15/2017 - 04/25/2017 Chemotherapy    FOLFOX x 6 cycles     05/02/2017 Imaging    CT C/A/P: IMPRESSION: 1. Nodular thickening and hyperenhancement in the distal esophagus and gastric cardia could reflect residual tumor. 2. No significant change in size of previously demonstrated hypermetabolic upper abdominal lymph nodes. No progressive adenopathy identified. No residual enlarged supraclavicular nodes are seen. 3. No evidence of progressive metastatic disease. No worrisome hepatic findings. 4. Tiny right upper lobe pulmonary nodule, not clearly seen on low dose previous PET-CT. Attention on follow-up recommended. 5. Cholelithiasis.      CANCER STAGING: Cancer Staging Primary esophageal adenocarcinoma [facility]) Staging form: Esophagus - Adenocarcinoma, AJCC 8th Edition - Clinical stage from 02/11/2017: Stage IVA (cTX, cN3, cM0) - Signed by Baird Cancer, PA-C on 02/11/2017    INTERVAL HISTORY:  Maxwell Henderson 53 y.o. adult returns for routine follow-up for esophageal adenocarcinoma. He reports he is still having the numbness in his feet. Sometimes it is painful or throbbing but the painful part doesn't last long. He has a cough from a mild cold he had. Denies any nausea, vomiting, or diarrhea. Denies any new pains. Had not noticed any recent bleeding such as epistaxis, hematuria or hematochezia. Denies recent chest pain on exertion, shortness of breath on minimal exertion, pre-syncopal episodes, or palpitations. Denies any numbness or tingling in hands  or feet. Denies any recent fevers, infections, or recent hospitalizations. Patient reports appetite at 100% and energy level at 100%. He is eating well and maintaining his weight at this time.    REVIEW OF SYSTEMS:  Review of Systems  Respiratory: Positive for cough.   Neurological: Positive for  numbness.  All other systems reviewed and are negative.    PAST MEDICAL/SURGICAL HISTORY:  Past Medical History:  Diagnosis Date  . Hypertension   . 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 ESOPHAGOGASTRODUODENOSCOPY (EGD) WITH PROPOFOL N/A 01/16/2017   Procedure: ESOPHAGOGASTRODUODENOSCOPY (EGD) WITH PROPOFOL;  Surgeon: [name], MD;  Location: [facility];  Service: Endoscopy;  Laterality: N/A;  730   . IR FLUORO GUIDE PORT INSERTION RIGHT  02/13/2017  . IR US GUIDE VASC ACCESS RIGHT  02/13/2017  . lipoma removal    . PORTA CATH INSERTION Right 02/13/2017  . SAVORY DILATION N/A 01/16/2017   Procedure: SAVORY DILATION;  Surgeon: [name], MD;  Location: [facility] SUITE;  Service: Endoscopy;  Laterality: N/A;     SOCIAL HISTORY:  Social History   Socioeconomic History  . Marital status: Married    Spouse name: Not on file  . Number of children: Not on file  . Years of education: Not on file  . Highest education level: Not on file  Occupational History  . Not on file  Social Needs  . Financial resource strain: Not on file  . Food insecurity:    Worry: Not on file    Inability: Not on file  . Transportation needs:    Medical: Not on file    Non-medical: Not on file  Tobacco Use  . Smoking status: Former Smoker    Packs/day: 0.25    Types: Cigarettes  . Smokeless tobacco: Never Used  Substance and Sexual Activity  . Alcohol use: No    Comment: hx heavy alcohol, hasn't drank in 19 yrs  . Drug use: Yes    Types: Marijuana    Comment: history of marijuana   . Sexual activity: Yes  Lifestyle  . Physical activity:    Days per week: Not on file    Minutes per session: Not on file  . Stress: Not on file  Relationships  . Social connections:    Talks on phone: Not on file    Gets together: Not on file    Attends religious service:  Not on file    Active member of club or organization: Not on file    Attends meetings of clubs or organizations: Not on file    Relationship status: Not on file  . Intimate partner violence:    Fear of current or ex partner: Not on file    Emotionally abused: Not on file    Physically abused: Not on file    Forced sexual activity: Not on file  Other Topics Concern  . Not on file  Social History Narrative   WORKING [employer].    FAMILY HISTORY:  Family History  Problem Relation Age of Onset  . Prostate cancer Father   . Colon cancer Neg Hx   . Colon polyps Neg Hx     CURRENT MEDICATIONS:  Outpatient Encounter Medications as of 09/27/2018  Medication Sig  . Alpha-Lipoic Acid 600 MG CAPS Take 1 capsule (600 mg total) by mouth daily.  Marland Kitchen  amLODipine (NORVASC) 10 MG tablet TAKE 1 TABLET BY MOUTH ONCE DAILY  . B Complex-C (B-COMPLEX WITH VITAMIN C) tablet Take 1 tablet by mouth daily.  Marland Kitchen dexamethasone (DECADRON) 4 MG tablet Take 8 mg by mouth 2 (two) times daily.  Marland Kitchen gabapentin (NEURONTIN) 300 MG capsule Take 1 capsule (300 mg total) by mouth 2 (two) times daily.  Marland Kitchen lidocaine-prilocaine (EMLA) cream Apply 1 application topically as needed.  . pantoprazole (PROTONIX) 40 MG tablet Take 1 tablet (40 mg total) by mouth 2 (two) times daily. Take 30 minutes before breakfast  . Ramucirumab (CYRAMZA IV) Inject into the vein every 14 (fourteen) days. Fridays of every other week  . SSD 1 % cream APPLY CREAM ONCE A DAY AS INSTRUCTED  . [DISCONTINUED] loratadine (CLARITIN) 10 MG tablet Take 1 tablet (10 mg total) by mouth daily.  Marland Kitchen acetaminophen (TYLENOL) 325 MG tablet Take 2 tablets (650 mg total) by mouth every 4 (four) hours as needed for headache. (Patient not taking: Reported on 09/27/2018)  . Morphine Sulfate (MORPHINE CONCENTRATE) 10 mg / 0.5 ml concentrated solution Take 0.5 mLs (10 mg total) by mouth every 4 (four) hours as needed for severe pain. (Patient not taking:  Reported on 09/27/2018)  . prochlorperazine (COMPAZINE) 10 MG tablet TAKE 1 TABLET BY MOUTH EVERY 6 HOURS AS NEEDED FOR NAUSEA AND VOMITING (Patient not taking: Reported on 09/27/2018)  . [DISCONTINUED] Misc. Devices MISC Please provide the patient with the [facility] HPB cough Syrup. 1 teaspoonful every 4-6 hours PRN. Please provide the patient with 360 ml.  . [DISCONTINUED] ranitidine (ZANTAC) 300 MG tablet Take 1 tablet (300 mg total) by mouth at bedtime.   No facility-administered encounter medications on file as of 09/27/2018.     ALLERGIES:  No Known Allergies   PHYSICAL EXAM:  ECOG Performance status: 1  Vitals:   09/27/18 0900  BP: (!) 137/91  Pulse: 86  Resp: 16  Temp: 98.3 F (36.8 C)  SpO2: 100%   Filed Weights   09/27/18 0900  Weight: 211 lb 1 oz (95.7 kg)    Physical Exam Constitutional:      Appearance: Normal appearance. She is normal weight.  Cardiovascular:     Rate and Rhythm: Normal rate and regular rhythm.     Heart sounds: Normal heart sounds.  Pulmonary:     Effort: Pulmonary effort is normal.     Breath sounds: Normal breath sounds.  Musculoskeletal: Normal range of motion.  Skin:    General: Skin is warm and dry.  Neurological:     Mental Status: She is alert and oriented to person, place, and time. Mental status is at baseline.  Psychiatric:        Mood and Affect: Mood normal.        Behavior: Behavior normal.        Thought Content: Thought content normal.        Judgment: Judgment normal.      LABORATORY DATA:  I have reviewed the labs as listed.  CBC    Component Value Date/Time   WBC 10.3 09/27/2018 0831   RBC 5.68 09/27/2018 0831   HGB 14.2 09/27/2018 0831   HCT 44.3 09/27/2018 0831   PLT 183 09/27/2018 0831   MCV 78.0 (L) 09/27/2018 0831   MCH 25.0 (L) 09/27/2018 0831   MCHC 32.1 09/27/2018 0831   RDW 15.2 09/27/2018 0831   LYMPHSABS 2.7 09/27/2018 0831   MONOABS 0.8 09/27/2018 0831   EOSABS 0.3 09/27/2018 0831  BASOSABS 0.1 09/27/2018 0831   CMP Latest Ref Rng & Units 09/27/2018 09/13/2018 08/30/2018  Glucose 70 - 99 mg/dL 146(H) 166(H) 116(H)  BUN 6 - 20 mg/dL '10 9 10  ' Creatinine 0.61 - 1.24 mg/dL 1.04 1.01 1.12  Sodium 135 - 145 mmol/L 137 137 138  Potassium 3.5 - 5.1 mmol/L 3.8 3.9 3.8  Chloride 98 - 111 mmol/L 103 104 104  CO2 22 - 32 mmol/L '25 27 26  ' Calcium 8.9 - 10.3 mg/dL 9.2 8.9 9.2  Total Protein 6.5 - 8.1 g/dL 8.1 7.5 7.7  Total Bilirubin 0.3 - 1.2 mg/dL 0.7 0.5 0.5  Alkaline Phos 38 - 126 U/L 58 52 51  AST 15 - 41 U/L '19 20 18  ' ALT 0 - 44 U/L '14 16 16       ' DIAGNOSTIC IMAGING:  I have independently reviewed the scans and discussed with the patient.   I have reviewed Francene Finders, NP's note and agree with the documentation.  I personally performed a face-to-face visit, made revisions and my assessment and plan is as follows.    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  Stage IV GE junction adenocarcinoma: - 6 cycles of FOLFOX from 02/15/2017 through 04/25/2017 -Paclitaxel and Cyramza from 05/18/2017, paclitaxel discontinued on 09/21/2017 secondary to neuropathy - Foundation 1 CDX showing MS-stable, TMB 3Muts/mb - CT scan on 11/28/2017 showing 4 mm right lower lobe nodule which is new, with stable disease elsewhere. - He was admitted to the hospital from 12/10/2017 through 12/12/2017 with pneumonia from parainfluenza 3. - CT scan on 03/22/2018 showed resolution of the right supraclavicular adenopathy and mediastinal adenopathy.  Residual soft tissue thickening involving the distal GE junction with no obvious mass.  Largely resolved upper abdominal adenopathy.  There is a persistent partially necrotic celiac axis lymph node which is stable.  No pulmonary metastatic disease. -CT abdomen pelvis on 08/23/2018 shows necrotic nodes with peripheral rim enhancement, largest measuring 19 mm, increased from 18 mm previously.  There is another lymph node which increased to 12  mm from 8 mm previously.  No new lesions seen.  Thickening of the GE junction is stable. -He does not report any symptoms of regurgitation.  No weight loss is reported.  Denies any bleeding issues. -Physical exam today is within normal limits. -He will continue Cyramza every 2 weeks until progression. -We will plan to repeat CT scans in June.  He will come back in 2 months for follow-up.  2.  Peripheral neuropathy: -He has bilateral toe numbness with occasional pains. -This was secondary to paclitaxel which was discontinued on 09/21/2017. -He will continue gabapentin 3 times a day.   3.  High risk drug monitoring: -UA was negative for proteinuria.  We will do intermittent UA.         Orders placed this encounter:  No orders of the defined types were placed in this encounter.     Derek Jack, MD [place] [phone]

## 2018-09-27 NOTE — Assessment & Plan Note (Signed)
1.  Stage IV GE junction adenocarcinoma: - 6 cycles of FOLFOX from 02/15/2017 through 04/25/2017 -Paclitaxel and Cyramza from 05/18/2017, paclitaxel discontinued on 09/21/2017 secondary to neuropathy - Foundation 1 CDX showing MS-stable, TMB 3Muts/mb - CT scan on 11/28/2017 showing 4 mm right lower lobe nodule which is new, with stable disease elsewhere. - He was admitted to the hospital from 12/10/2017 through 12/12/2017 with pneumonia from parainfluenza 3. - CT scan on 03/22/2018 showed resolution of the right supraclavicular adenopathy and mediastinal adenopathy.  Residual soft tissue thickening involving the distal GE junction with no obvious mass.  Largely resolved upper abdominal adenopathy.  There is a persistent partially necrotic celiac axis lymph node which is stable.  No pulmonary metastatic disease. -CT abdomen pelvis on 08/23/2018 shows necrotic nodes with peripheral rim enhancement, largest measuring 19 mm, increased from 18 mm previously.  There is another lymph node which increased to 12 mm from 8 mm previously.  No new lesions seen.  Thickening of the GE junction is stable. -He does not report any symptoms of regurgitation.  No weight loss is reported.  Denies any bleeding issues. -Physical exam today is within normal limits. -He will continue Cyramza every 2 weeks until progression. -We will plan to repeat CT scans in June.  He will come back in 2 months for follow-up.  2.  Peripheral neuropathy: -He has bilateral toe numbness with occasional pains. -This was secondary to paclitaxel which was discontinued on 09/21/2017. -He will continue gabapentin 3 times a day.   3.  High risk drug monitoring: -UA was negative for proteinuria.  We will do intermittent UA.

## 2018-09-27 NOTE — Patient Instructions (Signed)
[facility] at [facility] Discharge Instructions     Thank you for choosing [facility] at [facility] to provide your oncology and hematology care.  To afford each patient quality time with our provider, please arrive at least 15 minutes before your scheduled appointment time.   If you have a lab appointment with the Cancer Center please come in thru the  Main Entrance and check in at the main information desk  You need to re-schedule your appointment should you arrive 10 or more minutes late.  We strive to give you quality time with our providers, and arriving late affects you and other patients whose appointments are after yours.  Also, if you no show three or more times for appointments you may be dismissed from the clinic at the providers discretion.     Again, thank you for choosing [facility].  Our hope is that these requests will decrease the amount of time that you wait before being seen by our physicians.       _____________________________________________________________  Should you have questions after your visit to [facility], please contact our office at [phone] between the hours of 8:00 a.m. and 4:30 p.m.  Voicemails left after 4:00 p.m. will not be returned until the following business day.  For prescription refill requests, have your pharmacy contact our office and allow 72 hours.    Cancer Center Support Programs:   > Cancer Support Group  2nd Tuesday of the month 1pm-2pm, Journey Room    

## 2018-09-27 NOTE — Progress Notes (Signed)
To treatment room after oncology follow up with lab review.  Ok to treat today verbal order Dr. Delton Coombes.    Patient tolerated chemotherapy with no complaints voiced.  Port site clean and dry with no bruising or swelling noted at site.  Good blood return noted before and after administration of chemotherapy.  Band aid applied.  Patient left ambulatory with VSS and no s/s of distress noted.

## 2018-09-27 NOTE — Addendum Note (Signed)
Addended by: Glennie Isle on: 09/27/2018 12:24 PM   Modules accepted: Orders

## 2018-10-11 ENCOUNTER — Inpatient Hospital Stay (HOSPITAL_COMMUNITY): Payer: Medicaid Other

## 2018-10-11 ENCOUNTER — Encounter (HOSPITAL_COMMUNITY): Payer: Self-pay

## 2018-10-11 VITALS — BP 122/81 | HR 74 | Temp 98.0°F | Resp 18 | Wt 205.2 lb

## 2018-10-11 DIAGNOSIS — Z5111 Encounter for antineoplastic chemotherapy: Secondary | ICD-10-CM

## 2018-10-11 DIAGNOSIS — C159 Malignant neoplasm of esophagus, unspecified: Secondary | ICD-10-CM

## 2018-10-11 DIAGNOSIS — Z5112 Encounter for antineoplastic immunotherapy: Secondary | ICD-10-CM | POA: Diagnosis not present

## 2018-10-11 LAB — URINALYSIS, DIPSTICK ONLY
Bilirubin Urine: NEGATIVE
Glucose, UA: NEGATIVE mg/dL
Hgb urine dipstick: NEGATIVE
KETONES UR: NEGATIVE mg/dL
Leukocytes,Ua: NEGATIVE
Nitrite: NEGATIVE
Protein, ur: 30 mg/dL — AB
Specific Gravity, Urine: 1.03 (ref 1.005–1.030)
pH: 5 (ref 5.0–8.0)

## 2018-10-11 LAB — CBC WITH DIFFERENTIAL/PLATELET
Abs Immature Granulocytes: 0.01 10*3/uL (ref 0.00–0.07)
Basophils Absolute: 0.1 10*3/uL (ref 0.0–0.1)
Basophils Relative: 1 %
EOS ABS: 0.4 10*3/uL (ref 0.0–0.5)
Eosinophils Relative: 6 %
HCT: 44.1 % (ref 39.0–52.0)
Hemoglobin: 14.3 g/dL (ref 13.0–17.0)
Immature Granulocytes: 0 %
LYMPHS ABS: 2.4 10*3/uL (ref 0.7–4.0)
Lymphocytes Relative: 33 %
MCH: 25 pg — ABNORMAL LOW (ref 26.0–34.0)
MCHC: 32.4 g/dL (ref 30.0–36.0)
MCV: 77.2 fL — ABNORMAL LOW (ref 80.0–100.0)
Monocytes Absolute: 0.4 10*3/uL (ref 0.1–1.0)
Monocytes Relative: 6 %
Neutro Abs: 3.9 10*3/uL (ref 1.7–7.7)
Neutrophils Relative %: 54 %
Platelets: 202 10*3/uL (ref 150–400)
RBC: 5.71 MIL/uL (ref 4.22–5.81)
RDW: 15.1 % (ref 11.5–15.5)
WBC: 7.2 10*3/uL (ref 4.0–10.5)
nRBC: 0 % (ref 0.0–0.2)

## 2018-10-11 LAB — COMPREHENSIVE METABOLIC PANEL
ALT: 12 U/L (ref 0–44)
AST: 17 U/L (ref 15–41)
Albumin: 4.4 g/dL (ref 3.5–5.0)
Alkaline Phosphatase: 59 U/L (ref 38–126)
Anion gap: 7 (ref 5–15)
BILIRUBIN TOTAL: 0.5 mg/dL (ref 0.3–1.2)
BUN: 12 mg/dL (ref 6–20)
CO2: 26 mmol/L (ref 22–32)
Calcium: 9.3 mg/dL (ref 8.9–10.3)
Chloride: 105 mmol/L (ref 98–111)
Creatinine, Ser: 0.96 mg/dL (ref 0.61–1.24)
GFR calc Af Amer: >60 mL/min (ref >60)
GFR calc non Af Amer: >60 mL/min (ref >60)
Glucose, Bld: 132 mg/dL — ABNORMAL HIGH (ref 70–99)
Potassium: 3.7 mmol/L (ref 3.5–5.1)
Sodium: 138 mmol/L (ref 135–145)
Total Protein: 7.8 g/dL (ref 6.5–8.1)

## 2018-10-11 MED ORDER — SODIUM CHLORIDE 0.9 % IV SOLN
8.0000 mg/kg | Freq: Once | INTRAVENOUS | Status: AC
  Administered 2018-10-11: 800 mg via INTRAVENOUS
  Filled 2018-10-11: qty 50

## 2018-10-11 MED ORDER — DIPHENHYDRAMINE HCL 50 MG/ML IJ SOLN
50.0000 mg | Freq: Once | INTRAMUSCULAR | Status: AC
  Administered 2018-10-11: 50 mg via INTRAVENOUS

## 2018-10-11 MED ORDER — SODIUM CHLORIDE 0.9% FLUSH
10.0000 mL | INTRAVENOUS | Status: DC | PRN
  Administered 2018-10-11: 10 mL
  Filled 2018-10-11: qty 10

## 2018-10-11 MED ORDER — HEPARIN SOD (PORK) LOCK FLUSH 100 UNIT/ML IV SOLN
500.0000 [IU] | Freq: Once | INTRAVENOUS | Status: AC | PRN
  Administered 2018-10-11: 500 [IU]

## 2018-10-11 MED ORDER — SODIUM CHLORIDE 0.9 % IV SOLN
Freq: Once | INTRAVENOUS | Status: AC
  Administered 2018-10-11: 09:00:00 via INTRAVENOUS

## 2018-10-11 MED ORDER — DIPHENHYDRAMINE HCL 50 MG/ML IJ SOLN
INTRAMUSCULAR | Status: AC
  Filled 2018-10-11: qty 1

## 2018-10-11 NOTE — Patient Instructions (Signed)
Cancer Center Discharge Instructions for Patients Receiving Chemotherapy  Today you received the following chemotherapy agents  If you develop nausea and vomiting that is not controlled by your nausea medication, call the clinic.   BELOW ARE SYMPTOMS THAT SHOULD BE REPORTED IMMEDIATELY:  *FEVER GREATER THAN 100.5 F  *CHILLS WITH OR WITHOUT FEVER  NAUSEA AND VOMITING THAT IS NOT CONTROLLED WITH YOUR NAUSEA MEDICATION  *UNUSUAL SHORTNESS OF BREATH  *UNUSUAL BRUISING OR BLEEDING  TENDERNESS IN MOUTH AND THROAT WITH OR WITHOUT PRESENCE OF ULCERS  *URINARY PROBLEMS  *BOWEL PROBLEMS  UNUSUAL RASH Items with * indicate a potential emergency and should be followed up as soon as possible.  Feel free to call the clinic should you have any questions or concerns. The clinic phone number is [phone].  Please show the CHEMO ALERT CARD at check-in to the Emergency Department and triage nurse.   

## 2018-10-11 NOTE — Progress Notes (Signed)
Patient tolerated chemotherapy with no complaints voiced.  Port site clean and dry with no bruising or swelling noted at site.  Good blood return noted before and after administration of chemotherapy.  Band aid applied.  Patient left ambulatory with VSS and no s/s of distress noted.  

## 2018-10-25 ENCOUNTER — Other Ambulatory Visit (HOSPITAL_COMMUNITY): Payer: Medicaid Other

## 2018-10-25 ENCOUNTER — Encounter (HOSPITAL_COMMUNITY): Payer: Medicaid Other

## 2018-10-25 ENCOUNTER — Ambulatory Visit (HOSPITAL_COMMUNITY): Payer: Medicaid Other

## 2018-10-31 ENCOUNTER — Other Ambulatory Visit (HOSPITAL_COMMUNITY): Payer: Self-pay | Admitting: Nurse Practitioner

## 2018-10-31 ENCOUNTER — Other Ambulatory Visit (HOSPITAL_COMMUNITY): Payer: Self-pay

## 2018-10-31 DIAGNOSIS — C159 Malignant neoplasm of esophagus, unspecified: Secondary | ICD-10-CM

## 2018-10-31 DIAGNOSIS — R05 Cough: Secondary | ICD-10-CM

## 2018-10-31 DIAGNOSIS — R059 Cough, unspecified: Secondary | ICD-10-CM

## 2018-10-31 DIAGNOSIS — G6289 Other specified polyneuropathies: Secondary | ICD-10-CM

## 2018-10-31 MED ORDER — LORATADINE 10 MG PO TABS: 10.0000 mg | ORAL_TABLET | Freq: Every day | ORAL | 3 refills | Status: DC

## 2018-10-31 NOTE — Telephone Encounter (Signed)
Refill request for Loratadine 10mg  tablet.  Ok to refill per Francene Finders, NP.

## 2018-11-01 ENCOUNTER — Inpatient Hospital Stay (HOSPITAL_COMMUNITY): Payer: Medicaid Other | Attending: Hematology

## 2018-11-01 ENCOUNTER — Other Ambulatory Visit: Payer: Self-pay

## 2018-11-01 ENCOUNTER — Encounter (HOSPITAL_COMMUNITY): Payer: Self-pay

## 2018-11-01 ENCOUNTER — Inpatient Hospital Stay (HOSPITAL_COMMUNITY): Payer: Medicaid Other

## 2018-11-01 VITALS — BP 127/86 | HR 75 | Temp 98.3°F | Resp 18 | Wt 205.2 lb

## 2018-11-01 DIAGNOSIS — C770 Secondary and unspecified malignant neoplasm of lymph nodes of head, face and neck: Secondary | ICD-10-CM | POA: Diagnosis present

## 2018-11-01 DIAGNOSIS — C155 Malignant neoplasm of lower third of esophagus: Secondary | ICD-10-CM | POA: Insufficient documentation

## 2018-11-01 DIAGNOSIS — Z5112 Encounter for antineoplastic immunotherapy: Secondary | ICD-10-CM | POA: Insufficient documentation

## 2018-11-01 DIAGNOSIS — C159 Malignant neoplasm of esophagus, unspecified: Secondary | ICD-10-CM

## 2018-11-01 DIAGNOSIS — Z79899 Other long term (current) drug therapy: Secondary | ICD-10-CM | POA: Insufficient documentation

## 2018-11-01 DIAGNOSIS — Z5111 Encounter for antineoplastic chemotherapy: Secondary | ICD-10-CM

## 2018-11-01 LAB — COMPREHENSIVE METABOLIC PANEL
ALT: 16 U/L (ref 0–44)
AST: 22 U/L (ref 15–41)
Albumin: 4.1 g/dL (ref 3.5–5.0)
Alkaline Phosphatase: 58 U/L (ref 38–126)
Anion gap: 7 (ref 5–15)
BUN: 8 mg/dL (ref 6–20)
CO2: 26 mmol/L (ref 22–32)
CREATININE: 0.94 mg/dL (ref 0.61–1.24)
Calcium: 8.9 mg/dL (ref 8.9–10.3)
Chloride: 106 mmol/L (ref 98–111)
GFR calc Af Amer: >60 mL/min (ref >60)
GFR calc non Af Amer: >60 mL/min (ref >60)
Glucose, Bld: 158 mg/dL — ABNORMAL HIGH (ref 70–99)
Potassium: 3.6 mmol/L (ref 3.5–5.1)
Sodium: 139 mmol/L (ref 135–145)
Total Bilirubin: 0.6 mg/dL (ref 0.3–1.2)
Total Protein: 7.1 g/dL (ref 6.5–8.1)

## 2018-11-01 LAB — URINALYSIS, DIPSTICK ONLY
BILIRUBIN URINE: NEGATIVE
Glucose, UA: NEGATIVE mg/dL
Hgb urine dipstick: NEGATIVE
Ketones, ur: NEGATIVE mg/dL
Leukocytes,Ua: NEGATIVE
Nitrite: NEGATIVE
Protein, ur: 30 mg/dL — AB
Specific Gravity, Urine: 1.025 (ref 1.005–1.030)
pH: 6 (ref 5.0–8.0)

## 2018-11-01 LAB — CBC WITH DIFFERENTIAL/PLATELET
Abs Immature Granulocytes: 0.01 10*3/uL (ref 0.00–0.07)
Basophils Absolute: 0 10*3/uL (ref 0.0–0.1)
Basophils Relative: 1 %
Eosinophils Absolute: 0.2 10*3/uL (ref 0.0–0.5)
Eosinophils Relative: 4 %
HCT: 39.3 % (ref 39.0–52.0)
HEMOGLOBIN: 13.2 g/dL (ref 13.0–17.0)
Immature Granulocytes: 0 %
Lymphocytes Relative: 29 %
Lymphs Abs: 1.6 10*3/uL (ref 0.7–4.0)
MCH: 25.8 pg — ABNORMAL LOW (ref 26.0–34.0)
MCHC: 33.6 g/dL (ref 30.0–36.0)
MCV: 76.9 fL — ABNORMAL LOW (ref 80.0–100.0)
Monocytes Absolute: 0.3 10*3/uL (ref 0.1–1.0)
Monocytes Relative: 6 %
Neutro Abs: 3.3 10*3/uL (ref 1.7–7.7)
Neutrophils Relative %: 60 %
Platelets: 166 10*3/uL (ref 150–400)
RBC: 5.11 MIL/uL (ref 4.22–5.81)
RDW: 14.8 % (ref 11.5–15.5)
WBC: 5.4 10*3/uL (ref 4.0–10.5)
nRBC: 0 % (ref 0.0–0.2)

## 2018-11-01 LAB — TSH: TSH: 2.09 u[IU]/mL (ref 0.350–4.500)

## 2018-11-01 MED ORDER — HEPARIN SOD (PORK) LOCK FLUSH 100 UNIT/ML IV SOLN
500.0000 [IU] | Freq: Once | INTRAVENOUS | Status: AC | PRN
  Administered 2018-11-01: 500 [IU]
  Filled 2018-11-01: qty 5

## 2018-11-01 MED ORDER — SODIUM CHLORIDE 0.9% FLUSH
10.0000 mL | INTRAVENOUS | Status: DC | PRN
  Administered 2018-11-01 (×2): 10 mL
  Filled 2018-11-01 (×2): qty 10

## 2018-11-01 MED ORDER — DIPHENHYDRAMINE HCL 50 MG/ML IJ SOLN
50.0000 mg | Freq: Once | INTRAMUSCULAR | Status: AC
  Administered 2018-11-01: 50 mg via INTRAVENOUS
  Filled 2018-11-01: qty 1

## 2018-11-01 MED ORDER — SODIUM CHLORIDE 0.9 % IV SOLN
Freq: Once | INTRAVENOUS | Status: AC
  Administered 2018-11-01: 09:00:00 via INTRAVENOUS

## 2018-11-01 MED ORDER — SODIUM CHLORIDE 0.9 % IV SOLN
8.0000 mg/kg | Freq: Once | INTRAVENOUS | Status: AC
  Administered 2018-11-01: 800 mg via INTRAVENOUS
  Filled 2018-11-01: qty 50

## 2018-11-01 NOTE — Patient Instructions (Signed)
[facility] Discharge Instructions for Patients Receiving Chemotherapy   Beginning January 23rd 2017 lab work for the [facility] will be done in the  Main lab at [facility] on 1st floor. If you have a lab appointment with the [facility] please come in thru the  Main Entrance and check in at the main information desk   Today you received the following chemotherapy agents Cyramza. Follow-up as scheduled. Call clinic for any questions or concerns  To help prevent nausea and vomiting after your treatment, we encourage you to take your nausea medication   If you develop nausea and vomiting, or diarrhea that is not controlled by your medication, call the clinic.  The clinic phone number is [phone]. Office hours are Monday-Friday 8:30am-5:00pm.  BELOW ARE SYMPTOMS THAT SHOULD BE REPORTED IMMEDIATELY:  *FEVER GREATER THAN 101.0 F  *CHILLS WITH OR WITHOUT FEVER  NAUSEA AND VOMITING THAT IS NOT CONTROLLED WITH YOUR NAUSEA MEDICATION  *UNUSUAL SHORTNESS OF BREATH  *UNUSUAL BRUISING OR BLEEDING  TENDERNESS IN MOUTH AND THROAT WITH OR WITHOUT PRESENCE OF ULCERS  *URINARY PROBLEMS  *BOWEL PROBLEMS  UNUSUAL RASH Items with * indicate a potential emergency and should be followed up as soon as possible. If you have an emergency after office hours please contact your primary care physician or go to the nearest emergency department.  Please call the clinic during office hours if you have any questions or concerns.   You may also contact the Patient Navigator at [phone] should you have any questions or need assistance in obtaining follow up care.      Resources For Cancer Patients and their Caregivers ? American Cancer Society: Can assist with transportation, wigs, general needs, runs Look Good Feel Better.        (209)204-8543 ? Cancer Care: Provides financial assistance, online support groups, medication/co-pay assistance.  1-800-813-HOPE  636-070-7918) ? [facility] Assists [place] cancer patients and their families through emotional , educational and financial support.  408-330-5032 ? [place] DSS Where to apply for food stamps, Medicaid and utility assistance. 507-316-2957 ? [facility]: Transportation to medical appointments. (442)021-2416 ? Social Security Administration: May apply for disability if have a Stage IV cancer. 531-842-2504 469-775-4878 ? [employer], Disability and Transit Services: Assists with nutrition, care and transit needs. (615)011-5954

## 2018-11-01 NOTE — Progress Notes (Signed)
0900 Lab and urine results as well as pt's recent complaints of upper abdominal discomfort with decreased appetite and feeling like certain foods get stuck in his esophagus reviewed with Dr. Delton Coombes and pt approved for Cyramza infusion today. Pt for dietary consult today and future appt with Dr. Oneida Alar, Gastroenterologist.                                                            Maxwell Henderson tolerated Cyramza infusion well without complaints or incident. VSS upon discharge. Pt discharged self ambulatory in satisfactory condition accompanied by his wife

## 2018-11-01 NOTE — Patient Instructions (Signed)
[facility] at [facility] Discharge Instructions  Labs drawn from portacath today   Thank you for choosing  Cancer Center at [facility] to provide your oncology and hematology care.  To afford each patient quality time with our provider, please arrive at least 15 minutes before your scheduled appointment time.   If you have a lab appointment with the Cancer Center please come in thru the  Main Entrance and check in at the main information desk  You need to re-schedule your appointment should you arrive 10 or more minutes late.  We strive to give you quality time with our providers, and arriving late affects you and other patients whose appointments are after yours.  Also, if you no show three or more times for appointments you may be dismissed from the clinic at the providers discretion.     Again, thank you for choosing [facility].  Our hope is that these requests will decrease the amount of time that you wait before being seen by our physicians.       _____________________________________________________________  Should you have questions after your visit to [facility], please contact our office at [phone] between the hours of 8:00 a.m. and 4:30 p.m.  Voicemails left after 4:00 p.m. will not be returned until the following business day.  For prescription refill requests, have your pharmacy contact our office and allow 72 hours.    Cancer Center Support Programs:   > Cancer Support Group  2nd Tuesday of the month 1pm-2pm, Journey Room   

## 2018-11-01 NOTE — Progress Notes (Signed)
Nutrition Follow-up:  Patient with stage IV esophageal adenocarcinoma.  Patient receiving cyramza.    Met with patient during infusion.  Reports that his appetite is up and down.  Also reports more issues with feeling like foods are getting stuck in esophagus.  Reports chicken to be a problem food for him.  "I can feel it get stuck and sometimes it goes down and other times I have to throw up back up."  Reports has been drinking ensure 2-3 per day. Reports taste of foods are off and sometimes feels like food grows in his mouth and has to spit it back out.    Medications: reviewed  Labs: glucose 158  Anthropometrics:    Weight 205 lb 3.2 oz today decreased from 210 lb since last seen on 07/23/18 by RD.     NUTRITION DIAGNOSIS: Inadequate oral intake decreased   INTERVENTION:  Gave patient 3rd case of ensure enlive today with coupons.  If unable to eat and able to drink liquids can drink 6-7 bottles per day to better meet nutritional needs Discussed soft, moist protein foods.  Handout given with recipes of high calorie, high protein soft foods.  Provided recipes for patient to make homemade shakes from [employer].    Patient has an appointment to see GI in early April  MONITORING, EVALUATION, GOAL:  Weight trends, intake   NEXT VISIT: April 17 during infusion  Jacoria Keiffer B. Zenia Resides, [place], [place] Registered Dietitian [phone] (pager)

## 2018-11-08 ENCOUNTER — Other Ambulatory Visit (HOSPITAL_COMMUNITY): Payer: Medicaid Other

## 2018-11-08 ENCOUNTER — Ambulatory Visit (HOSPITAL_COMMUNITY): Payer: Medicaid Other

## 2018-11-11 ENCOUNTER — Other Ambulatory Visit (HOSPITAL_COMMUNITY): Payer: Self-pay | Admitting: Nurse Practitioner

## 2018-11-11 DIAGNOSIS — G6289 Other specified polyneuropathies: Secondary | ICD-10-CM

## 2018-11-14 ENCOUNTER — Other Ambulatory Visit: Payer: Self-pay

## 2018-11-15 ENCOUNTER — Inpatient Hospital Stay (HOSPITAL_COMMUNITY): Payer: Medicaid Other

## 2018-11-15 ENCOUNTER — Encounter (HOSPITAL_COMMUNITY): Payer: Self-pay

## 2018-11-15 ENCOUNTER — Other Ambulatory Visit: Payer: Self-pay

## 2018-11-15 ENCOUNTER — Inpatient Hospital Stay (HOSPITAL_COMMUNITY): Payer: Medicaid Other | Attending: Hematology

## 2018-11-15 ENCOUNTER — Encounter (HOSPITAL_COMMUNITY): Payer: Medicaid Other

## 2018-11-15 VITALS — BP 130/85 | HR 67 | Temp 97.6°F | Resp 18 | Wt 202.2 lb

## 2018-11-15 DIAGNOSIS — Z87891 Personal history of nicotine dependence: Secondary | ICD-10-CM | POA: Diagnosis not present

## 2018-11-15 DIAGNOSIS — Z5111 Encounter for antineoplastic chemotherapy: Secondary | ICD-10-CM | POA: Diagnosis present

## 2018-11-15 DIAGNOSIS — R197 Diarrhea, unspecified: Secondary | ICD-10-CM | POA: Insufficient documentation

## 2018-11-15 DIAGNOSIS — C159 Malignant neoplasm of esophagus, unspecified: Secondary | ICD-10-CM

## 2018-11-15 DIAGNOSIS — C155 Malignant neoplasm of lower third of esophagus: Secondary | ICD-10-CM | POA: Insufficient documentation

## 2018-11-15 DIAGNOSIS — R110 Nausea: Secondary | ICD-10-CM | POA: Diagnosis not present

## 2018-11-15 DIAGNOSIS — R911 Solitary pulmonary nodule: Secondary | ICD-10-CM | POA: Insufficient documentation

## 2018-11-15 DIAGNOSIS — Z5112 Encounter for antineoplastic immunotherapy: Secondary | ICD-10-CM | POA: Insufficient documentation

## 2018-11-15 DIAGNOSIS — R634 Abnormal weight loss: Secondary | ICD-10-CM | POA: Diagnosis not present

## 2018-11-15 DIAGNOSIS — Z452 Encounter for adjustment and management of vascular access device: Secondary | ICD-10-CM | POA: Diagnosis not present

## 2018-11-15 DIAGNOSIS — C770 Secondary and unspecified malignant neoplasm of lymph nodes of head, face and neck: Secondary | ICD-10-CM | POA: Insufficient documentation

## 2018-11-15 DIAGNOSIS — G620 Drug-induced polyneuropathy: Secondary | ICD-10-CM | POA: Diagnosis not present

## 2018-11-15 LAB — CBC WITH DIFFERENTIAL/PLATELET
Abs Immature Granulocytes: 0.01 10*3/uL (ref 0.00–0.07)
Basophils Absolute: 0 10*3/uL (ref 0.0–0.1)
Basophils Relative: 1 %
Eosinophils Absolute: 0.2 10*3/uL (ref 0.0–0.5)
Eosinophils Relative: 3 %
HCT: 41.5 % (ref 39.0–52.0)
Hemoglobin: 13.8 g/dL (ref 13.0–17.0)
Immature Granulocytes: 0 %
Lymphocytes Relative: 36 %
Lymphs Abs: 2 10*3/uL (ref 0.7–4.0)
MCH: 25.4 pg — ABNORMAL LOW (ref 26.0–34.0)
MCHC: 33.3 g/dL (ref 30.0–36.0)
MCV: 76.3 fL — ABNORMAL LOW (ref 80.0–100.0)
Monocytes Absolute: 0.4 10*3/uL (ref 0.1–1.0)
Monocytes Relative: 8 %
Neutro Abs: 2.9 10*3/uL (ref 1.7–7.7)
Neutrophils Relative %: 52 %
Platelets: 196 10*3/uL (ref 150–400)
RBC: 5.44 MIL/uL (ref 4.22–5.81)
RDW: 14.6 % (ref 11.5–15.5)
WBC: 5.6 10*3/uL (ref 4.0–10.5)
nRBC: 0 % (ref 0.0–0.2)

## 2018-11-15 LAB — COMPREHENSIVE METABOLIC PANEL
ALT: 14 U/L (ref 0–44)
AST: 16 U/L (ref 15–41)
Albumin: 4.3 g/dL (ref 3.5–5.0)
Alkaline Phosphatase: 54 U/L (ref 38–126)
Anion gap: 8 (ref 5–15)
BUN: 12 mg/dL (ref 6–20)
CO2: 25 mmol/L (ref 22–32)
Calcium: 9.2 mg/dL (ref 8.9–10.3)
Chloride: 104 mmol/L (ref 98–111)
Creatinine, Ser: 0.82 mg/dL (ref 0.61–1.24)
GFR calc Af Amer: >60 mL/min (ref >60)
GFR calc non Af Amer: >60 mL/min (ref >60)
Glucose, Bld: 97 mg/dL (ref 70–99)
Potassium: 3.7 mmol/L (ref 3.5–5.1)
Sodium: 137 mmol/L (ref 135–145)
Total Bilirubin: 0.3 mg/dL (ref 0.3–1.2)
Total Protein: 7.7 g/dL (ref 6.5–8.1)

## 2018-11-15 LAB — URINALYSIS, DIPSTICK ONLY
Bilirubin Urine: NEGATIVE
Glucose, UA: NEGATIVE mg/dL
Hgb urine dipstick: NEGATIVE
Ketones, ur: 5 mg/dL — AB
Leukocytes,Ua: NEGATIVE
Nitrite: NEGATIVE
Protein, ur: 30 mg/dL — AB
Specific Gravity, Urine: 1.03 (ref 1.005–1.030)
pH: 5 (ref 5.0–8.0)

## 2018-11-15 MED ORDER — DIPHENHYDRAMINE HCL 50 MG/ML IJ SOLN
INTRAMUSCULAR | Status: AC
  Filled 2018-11-15: qty 1

## 2018-11-15 MED ORDER — HEPARIN SOD (PORK) LOCK FLUSH 100 UNIT/ML IV SOLN
500.0000 [IU] | Freq: Once | INTRAVENOUS | Status: AC | PRN
  Administered 2018-11-15: 500 [IU]

## 2018-11-15 MED ORDER — SODIUM CHLORIDE 0.9% FLUSH
10.0000 mL | INTRAVENOUS | Status: DC | PRN
  Administered 2018-11-15 (×2): 10 mL
  Filled 2018-11-15 (×2): qty 10

## 2018-11-15 MED ORDER — DIPHENHYDRAMINE HCL 50 MG/ML IJ SOLN
50.0000 mg | Freq: Once | INTRAMUSCULAR | Status: AC
  Administered 2018-11-15: 50 mg via INTRAVENOUS

## 2018-11-15 MED ORDER — SODIUM CHLORIDE 0.9 % IV SOLN
8.0000 mg/kg | Freq: Once | INTRAVENOUS | Status: AC
  Administered 2018-11-15: 800 mg via INTRAVENOUS
  Filled 2018-11-15: qty 50

## 2018-11-15 MED ORDER — SODIUM CHLORIDE 0.9 % IV SOLN
Freq: Once | INTRAVENOUS | Status: AC
  Administered 2018-11-15: 09:00:00 via INTRAVENOUS

## 2018-11-15 NOTE — Progress Notes (Signed)
Pt presents today for Cyramza. VSS. Labs drawn via port and sent to lab. Pt has no complaints of any changes since the last visit. MAR reviewed with pt. Reported 30 of protein in the urine. CBC and CMP within parameters. Proceed with treatment.   Treatment given today per MD orders. Tolerated infusion without adverse affects. Vital signs stable. No complaints at this time. Discharged from clinic ambulatory. F/U with [facility] as scheduled.

## 2018-11-15 NOTE — Patient Instructions (Signed)
[facility] Discharge Instructions for Patients Receiving Chemotherapy  Today you received the following chemotherapy agents   To help prevent nausea and vomiting after your treatment, we encourage you to take your nausea medication   If you develop nausea and vomiting that is not controlled by your nausea medication, call the clinic.   BELOW ARE SYMPTOMS THAT SHOULD BE REPORTED IMMEDIATELY:  *FEVER GREATER THAN 100.5 F  *CHILLS WITH OR WITHOUT FEVER  NAUSEA AND VOMITING THAT IS NOT CONTROLLED WITH YOUR NAUSEA MEDICATION  *UNUSUAL SHORTNESS OF BREATH  *UNUSUAL BRUISING OR BLEEDING  TENDERNESS IN MOUTH AND THROAT WITH OR WITHOUT PRESENCE OF ULCERS  *URINARY PROBLEMS  *BOWEL PROBLEMS  UNUSUAL RASH Items with * indicate a potential emergency and should be followed up as soon as possible.  Feel free to call the clinic should you have any questions or concerns. The clinic phone number is [phone].  Please show the CHEMO ALERT CARD at check-in to the Emergency Department and triage nurse.   

## 2018-11-20 ENCOUNTER — Other Ambulatory Visit: Payer: Self-pay

## 2018-11-20 ENCOUNTER — Encounter: Payer: Self-pay | Admitting: Gastroenterology

## 2018-11-20 ENCOUNTER — Telehealth: Payer: Self-pay | Admitting: *Deleted

## 2018-11-20 ENCOUNTER — Ambulatory Visit: Payer: Medicaid Other | Admitting: Gastroenterology

## 2018-11-20 DIAGNOSIS — R634 Abnormal weight loss: Secondary | ICD-10-CM

## 2018-11-20 DIAGNOSIS — R1319 Other dysphagia: Secondary | ICD-10-CM

## 2018-11-20 DIAGNOSIS — R1310 Dysphagia, unspecified: Secondary | ICD-10-CM

## 2018-11-20 DIAGNOSIS — C159 Malignant neoplasm of esophagus, unspecified: Secondary | ICD-10-CM

## 2018-11-20 NOTE — Assessment & Plan Note (Signed)
IN SETTING OF STAGE IV ESOPHAGEAL CANCER. PT'S TUMOR FOUND AT [facility]. WEIGHT DOWN 10 LBS SINCE JAN 2020.  See GI DOCS AT [facility]. THEY CAN DISCUSS WHAT OPTIONS ARE AVAILABLE TO YOU. FOLLOW A SOFT MECHANICAL DIET.  MEATS SHOULD BE CHOPPED OR GROUND ONLY. DO NOT EAT CHUNKS OF ANYTHING.  HANDOUT GIVEN. DRINK ENSURE IN THE MORNING, MIDDAY, AND AT BEDTIME. OTHER ALTERNATIVES INCLUDE CARNATION INSTANT BREAKFAST OR BOOST.  FOLLOW UP IN 6 MOS.

## 2018-11-20 NOTE — Telephone Encounter (Signed)
Called [facility] to do referral for pt. Was advised the nurse manager Santiago Glad is currently scheduling appointments and a message will be sent to her. They will contact patient to schedule. Records faxed to [phone]

## 2018-11-20 NOTE — Patient Instructions (Signed)
See GI DOCS AT [facility]. THEY CAN DISCUSS WHAT OPTIONS ARE AVAILABLE TO YOU.  FOLLOW A SOFT MECHANICAL DIET.  MEATS SHOULD BE CHOPPED OR GROUND ONLY. DO NOT EAT CHUNKS OF ANYTHING. SEE INFO BELOW.  DRINK ENSURE IN THE MORNING, MIDDAY AND AT BEDTIME. OTHER ALTERNATIVES INCLUDE CARNATION INSTANT BREAKFAST OR BOOST.  FOLLOW UP IN 6 MOS.   SOFT MECHANICAL DIET This SOFT MECHANICAL DIET is restricted to:  Foods that are moist, soft-textured, and easy to chew and swallow.   Meats that are ground or are minced no larger than one-quarter inch pieces. Meats are moist with gravy or sauce added.   Foods that do not include bread or bread-like textures except soft pancakes, well-moistened with syrup or sauce.   Textures with some chewing ability required.   Casseroles without rice.   Cooked vegetables that are less than half an inch in size and easily mashed with a fork. No cooked corn, peas, broccoli, cauliflower, cabbage, Brussels sprouts, asparagus, or other fibrous, non-tender or rubbery cooked vegetables.   Canned fruit except for pineapple. Fruit must be cut into pieces no larger than half an inch in size.   Foods that do not include nuts, seeds, coconut, or sticky textures.   FOOD TEXTURES FOR DYSPHAGIA DIET LEVEL 2 -SOFT MECHANICAL DIET (includes all foods on Dysphagia Diet Level 1 - Pureed, in addition to the foods listed below)  FOOD GROUP: Breads. RECOMMENDED: Soft pancakes, well-moistened with syrup or sauce.  AVOID: All others.  FOOD GROUP: Cereals.  RECOMMENDED: Cooked cereals with little texture, including oatmeal. Unprocessed wheat bran stirred into cereals for bulk. Note: If thin liquids are restricted, it is important that all of the liquid is absorbed into the cereal.  AVOID: All dry cereals and any cooked cereals that may contain flax seeds or other seeds or nuts. Whole-grain, dry, or coarse cereals. Cereals with nuts, seeds, dried fruit, and/or  coconut.  FOOD GROUP: Desserts. RECOMMENDED: Pudding, custard. Soft fruit pies with bottom crust only. Canned fruit (excluding pineapple). Soft, moist cakes with icing.Frozen malts, milk shakes, frozen yogurt, eggnog, nutritional supplements, ice cream, sherbet, regular or sugar-free gelatin, or any foods that become thin liquid at either room (70 F) or body temperature (98 F).  AVOID: Dry, coarse cakes and cookies. Anything with nuts, seeds, coconut, pineapple, or dried fruit. Breakfast yogurt with nuts. Rice or bread pudding.  FOOD GROUP: Fats. RECOMMENDED: Butter, margarine, cream for cereal (depending on liquid consistency recommendations), gravy, cream sauces, sour cream, sour cream dips with soft additives, mayonnaise, salad dressings, cream cheese, cream cheese spreads with soft additives, whipped toppings.  AVOID: All fats with coarse or chunky additives.  FOOD GROUP: Fruits. RECOMMENDED: Soft drained, canned, or cooked fruits without seeds or skin. Fresh soft and ripe banana. Fruit juices with a small amount of pulp. If thin liquids are restricted, fruit juices should be thickened to appropriate consistency.  AVOID: Fresh or frozen fruits. Cooked fruit with skin or seeds. Dried fruits. Fresh, canned, or cooked pineapple.  FOOD GROUP: Meats and Meat Substitutes. (Meat pieces should not exceed 1/4 of an inch cube and should be tender.) RECOMMENDED: Moistened ground or cooked meat, poultry, or fish. Moist ground or tender meat may be served with gravy or sauce. Casseroles without rice. Moist macaroni and cheese, well-cooked pasta with meat sauce, tuna noodle casserole, soft, moist lasagna. Moist meatballs, meatloaf, or fish loaf. Protein salads, such as tuna or egg without large chunks, celery, or onion. Cottage cheese, smooth  quiche without large chunks. Poached, scrambled, or soft-cooked eggs (egg yolks should not be "runny" but should be moist and able to be mashed with butter,  margarine, or other moisture added to them). (Cook eggs to 160 F or use pasteurized eggs for safety.) Souffls may have small, soft chunks. Tofu. Well-cooked, slightly mashed, moist legumes, such as baked beans. All meats or protein substitutes should be served with sauces or moistened to help maintain cohesiveness in the oral cavity.  AVOID: Dry meats, tough meats (such as bacon, sausage, hot dogs, bratwurst). Dry casseroles or casseroles with rice or large chunks. Peanut butter. Cheese slices and cubes. Hard-cooked or crisp fried eggs. Sandwiches.Pizza.  FOOD GROUP: Potatoes and Starches. RECOMMENDED: Well-cooked, moistened, boiled, baked, or mashed potatoes. Well-cooked shredded hash brown potatoes that are not crisp. (All potatoes need to be moist and in sauces.)Well-cooked noodles in sauce. Spaetzel or soft dumplings that have been moistened with butter or gravy.  AVOID: Potato skins and chips. Fried or French-fried potatoes. Rice.  FOOD GROUP: Soups. RECOMMENDED: Soups with easy-to-chew or easy-to-swallow meats or vegetables: Particle sizes in soups should be less than 1/2 inch. Soups will need to be thickened to appropriate consistency if soup is thinner than prescribed liquid consistency.  AVOID: Soups with large chunks of meat and vegetables. Soups with rice, corn, peas.  FOOD GROUP: Vegetables. RECOMMENDED: All soft, well-cooked vegetables. Vegetables should be less than a half inch. Should be easily mashed with a fork.  AVOID: Cooked corn and peas. Broccoli, cabbage, Brussels sprouts, asparagus, or other fibrous, non-tender or rubbery cooked vegetables.  FOOD GROUP: Miscellaneous. RECOMMENDED: Jams and preserves without seeds, jelly. Sauces, salsas, etc., that may have small tender chunks less than 1/2 inch. Soft, smooth chocolate bars that are easily chewed.  AVOID: Seeds, nuts, coconut, or sticky foods. Chewy candies such as caramels or licorice.

## 2018-11-20 NOTE — Progress Notes (Signed)
Subjective:    Patient ID: Maxwell Henderson, adult    DOB: [birth year], 53 y.o.   MRN: [id number]  Maxwell Backbone, FNP  HPI LOST 10 LBS. DRINKING ENSURE ONE IN AM AND AT BEDTIME. APPETITE: COMES AND GOES. SWALLOWING: SOLIDS AND LIQUIDS, PRETTY MUCH ANYTHING BUT DIFFICULTY WITH CHICKEN. IF DOESN'T CHEW WELL HE MAY FEEL MEAT GET STUCK. ENERGY LEVEL GOOD. MAY WAKE UP WITH NAUSEA AND THROWS UP: NO BLOOD(1-2X/MO). IF DOESN'T EAT AND HE EATS IT WONT'S SETTLE DOWN AND THEN HAS TO THROW UP AFTER EATING. BMs: WORKS FINE. SOMETIME AMs HAS ACHY PAIN IN EPIGASTRIUM: 1-2X/WEEK IF THAT. THE NAUSEA PILL MAKES IT GO AWAY. PLAN FOR CHEMO: QOFRI. LIVES WITH HIS WIFE, TWIN BOYS AND ONE DAUGHTER. 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  PT DENIES FEVER, CHILLS, HEMATOCHEZIA, HEMATEMESIS, melena, diarrhea, CHEST PAIN, SHORTNESS OF BREATH, CHANGE IN BOWEL IN HABITS, constipation, OR heartburn or indigestion.  Past Medical History:  Diagnosis Date  . Hypertension   . 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 ESOPHAGOGASTRODUODENOSCOPY (EGD) WITH PROPOFOL N/A 01/16/2017   Procedure: ESOPHAGOGASTRODUODENOSCOPY (EGD) WITH PROPOFOL;  Surgeon: [name], MD;  [facility];  Service: Endoscopy;  Laterality: N/A;  730   . IR FLUORO GUIDE PORT INSERTION RIGHT  02/13/2017  . IR US GUIDE VASC ACCESS RIGHT  02/13/2017  . lipoma removal    . PORTA CATH INSERTION Right 02/13/2017  . SAVORY DILATION N/A 01/16/2017   Procedure: SAVORY DILATION;  Surgeon: [name], MD;  Location: [facility];  Service: Endoscopy;  Laterality: N/A;    No Known Allergies  Current Outpatient Medications  Medication Sig    . acetaminophen (TYLENOL) 325 MG tablet Take 2 tablets (650 mg total) by mouth every 4 (four) hours as needed for headache.    . Alpha-Lipoic Acid 600 MG CAPS Take 1  capsule (600 mg total) by mouth daily.    Marland Kitchen amLODipine (NORVASC) 10 MG tablet TAKE 1 TABLET BY MOUTH ONCE DAILY    . dexamethasone (DECADRON) 4 MG tablet Take 8 mg by mouth 2 (two) times daily.    Marland Kitchen gabapentin (NEURONTIN) 300 MG capsule Take 1 capsule by mouth twice daily    . lidocaine-prilocaine (EMLA) cream Apply 1 application topically as needed.    . Misc. Devices MISC Please provide the patient with the [facility]othe[facility]ary HPB cough Syrup. 1 teaspoonful every 4-6 hours PRN. Please provide the patient with 360 ml.    . Morphine Sulfate (MORPHINE CONCENTRATE) 10 mg / 0.5 ml concentrated solution Take 0.5 mLs (10 mg total) by mouth every 4 (four) hours as needed for severe pain.    . pantoprazole (PROTONIX) 40 MG tablet Take 1 tablet (40 mg total) by mouth 2 (two) times daily. Take 30 minutes before breakfast    . prochlorperazine (COMPAZINE) 10 MG tablet TAKE 1 TABLET BY MOUTH EVERY 6 HOURS AS NEEDED FOR NAUSEA AND VOMITING    . Ramucirumab (CYRAMZA IV) Inject into the vein every 14 (fourteen) days. Fridays of every other week    . SSD 1 % cream APPLY CREAM ONCE A DAY AS INSTRUCTED    . B Complex-C (B-COMPLEX WITH VITAMIN C) tablet Take 1 tablet by mouth daily. (Patient not taking: Reported on 11/20/2018)    . loratadine (CLARITIN) 10 MG tablet Take 1 tablet (10 mg total)  by mouth daily. (Patient not taking: Reported on 11/20/2018)     Review of Systems PER HPI OTHERWISE ALL SYSTEMS ARE NEGATIVE.    Objective:   Physical Exam Vitals signs reviewed.  Constitutional:      General: He is not in acute distress.    Appearance: He is well-developed.  HENT:     Head: Normocephalic and atraumatic.     Mouth/Throat:     Pharynx: No oropharyngeal exudate.  Eyes:     General: No scleral icterus.    Pupils: Pupils are equal, round, and reactive to light.  Neck:     Musculoskeletal: Normal range of motion and neck supple.  Cardiovascular:     Rate and Rhythm: Normal rate and regular rhythm.     Heart  sounds: Normal heart sounds.  Pulmonary:     Effort: Pulmonary effort is normal. No respiratory distress.     Breath sounds: Normal breath sounds.  Abdominal:     General: Bowel sounds are normal. There is no distension.     Palpations: Abdomen is soft.     Tenderness: There is no abdominal tenderness.  Lymphadenopathy:     Cervical: No cervical adenopathy.  Neurological:     Mental Status: He is alert and oriented to person, place, and time.     Comments: NO  NEW FOCAL DEFICITS  Psychiatric:        Mood and Affect: Mood normal.       Assessment & Plan:

## 2018-11-21 ENCOUNTER — Other Ambulatory Visit (HOSPITAL_COMMUNITY): Payer: Medicaid Other

## 2018-11-21 ENCOUNTER — Ambulatory Visit (HOSPITAL_COMMUNITY): Payer: Medicaid Other | Admitting: Hematology

## 2018-11-21 ENCOUNTER — Ambulatory Visit (HOSPITAL_COMMUNITY): Payer: Medicaid Other

## 2018-11-21 NOTE — Progress Notes (Signed)
ON RECALL  °

## 2018-11-21 NOTE — Progress Notes (Signed)
cc'ed to pcp °

## 2018-11-24 ENCOUNTER — Emergency Department (HOSPITAL_COMMUNITY)
Admission: EM | Admit: 2018-11-24 | Discharge: 2018-11-24 | Disposition: A | Discharge status: Home / Self Care | Payer: Medicaid Other | Attending: Emergency Medicine | Admitting: Emergency Medicine

## 2018-11-24 ENCOUNTER — Encounter (HOSPITAL_COMMUNITY): Payer: Self-pay | Admitting: Emergency Medicine

## 2018-11-24 ENCOUNTER — Other Ambulatory Visit: Payer: Self-pay

## 2018-11-24 DIAGNOSIS — E119 Type 2 diabetes mellitus without complications: Secondary | ICD-10-CM | POA: Insufficient documentation

## 2018-11-24 DIAGNOSIS — Z87891 Personal history of nicotine dependence: Secondary | ICD-10-CM | POA: Insufficient documentation

## 2018-11-24 DIAGNOSIS — Y999 Unspecified external cause status: Secondary | ICD-10-CM | POA: Insufficient documentation

## 2018-11-24 DIAGNOSIS — W57XXXA Bitten or stung by nonvenomous insect and other nonvenomous arthropods, initial encounter: Secondary | ICD-10-CM | POA: Insufficient documentation

## 2018-11-24 DIAGNOSIS — Z79899 Other long term (current) drug therapy: Secondary | ICD-10-CM | POA: Diagnosis not present

## 2018-11-24 DIAGNOSIS — I10 Essential (primary) hypertension: Secondary | ICD-10-CM | POA: Diagnosis not present

## 2018-11-24 DIAGNOSIS — S40262A Insect bite (nonvenomous) of left shoulder, initial encounter: Secondary | ICD-10-CM | POA: Insufficient documentation

## 2018-11-24 DIAGNOSIS — Y939 Activity, unspecified: Secondary | ICD-10-CM | POA: Insufficient documentation

## 2018-11-24 DIAGNOSIS — Y929 Unspecified place or not applicable: Secondary | ICD-10-CM | POA: Diagnosis not present

## 2018-11-24 MED ORDER — DOXYCYCLINE HYCLATE 100 MG PO TABS: 100.0000 mg | ORAL_TABLET | Freq: Two times a day (BID) | ORAL | 0 refills | Status: AC

## 2018-11-24 NOTE — ED Provider Notes (Signed)
[facility] EMERGENCY DEPARTMENT Provider Note   CSN: [id number] Arrival date & time: 11/24/18  0856    History   Chief Complaint Chief Complaint  Patient presents with  . Insect Bite    HPI Maxwell Henderson is a 53 y.o. male.     HPI Patient presents to the emergency room for evaluation of a tick bite.  Patient states he was bitten on his left shoulder area by a tick on Friday.  He removed it at that time.  This morning however he started to notice a rash developing around the bite site.  Patient denies having any trouble with any fevers or chills.  No myalgias.  However, he does have history of cancer and is undergoing chemotherapy.  He read about a tick illnesses and felt he better get checked out. Past Medical History:  Diagnosis Date  . Hypertension   . 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 Type 2 diabetes mellitus without complication, without long-term current use of insulin ([place])   . Essential hypertension   . SIRS (systemic inflammatory response syndrome) ([place]) 12/11/2017  . HCAP (healthcare-associated pneumonia) 12/11/2017  . Infection due to parainfluenza virus 3 12/11/2017  . PNA (pneumonia) 12/10/2017  . Hypokalemia 12/10/2017  . 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 Dysphagia 01/05/2017    Past Surgical History:  Procedure Laterality Date  . ESOPHAGOGASTRODUODENOSCOPY (EGD) WITH PROPOFOL N/A 01/16/2017   Procedure: ESOPHAGOGASTRODUODENOSCOPY (EGD) WITH PROPOFOL;  Surgeon: [name], MD;  Location[facility];  Service: Endoscopy;  Laterality: N/A;  730   . IR FLUORO GUIDE PORT INSERTION RIGHT  02/13/2017  . IR US GUIDE VASC ACCESS RIGHT  02/13/2017  . lipoma removal    . PORTA CATH INSERTION Right 02/13/2017  . SAVORY DILATION N/A 01/16/2017   Procedure: SAVORY DILATION;  Surgeon: [name], MD;  Location: [facility];  Service: Endoscopy;  Laterality: N/A;        Home Medications    Prior to Admission medications   Medication Sig Start Date End Date Taking? Authorizing Provider  acetaminophen (TYLENOL) 325 MG tablet Take 2 tablets (650 mg total) by mouth every 4 (four) hours as needed for headache. 12/12/17   Barton Dubois, MD  Alpha-Lipoic Acid 600 MG CAPS Take 1 capsule (600 mg total) by mouth daily. 09/28/17   Holley Bouche, NP  amLODipine (NORVASC) 10 MG tablet TAKE 1 TABLET BY MOUTH ONCE DAILY 09/16/18   Lockamy, Randi L, NP-C  B Complex-C (B-COMPLEX WITH VITAMIN C) tablet Take 1 tablet by mouth daily. Patient not taking: Reported on 11/20/2018 09/28/17   Holley Bouche, NP  dexamethasone (DECADRON) 4 MG tablet Take 8 mg by mouth 2 (two) times daily. 07/21/18   [provider]  doxycycline (VIBRA-TABS) 100 MG tablet Take 1 tablet (100 mg total) by mouth 2 (two) times daily for 7 days. 11/24/18 12/01/18  Dorie Rank, MD  gabapentin (NEURONTIN) 300 MG capsule Take 1 capsule by mouth twice daily 11/11/18   Lockamy, Randi L, NP-C  lidocaine-prilocaine (EMLA) cream Apply 1 application topically as needed. 01/18/18   Derek Jack, MD  loratadine (CLARITIN) 10 MG tablet Take 1 tablet (10 mg total) by mouth daily. Patient not taking: Reported on 11/20/2018 10/31/18   Glennie Isle, NP-C  Misc. Devices MISC Please provide the patient with the Apothe[facility]ary HPB cough Syrup. 1 teaspoonful every 4-6 hours PRN. Please provide the patient with 360 ml. 09/27/18   Lockamy, Randi L, NP-C  Morphine Sulfate (MORPHINE CONCENTRATE) 10 mg / 0.5 ml concentrated solution Take 0.5 mLs (10 mg total) by mouth every 4 (four) hours as needed for severe pain. 05/02/17   Twana First, MD  pantoprazole (PROTONIX) 40 MG tablet Take 1 tablet (40 mg total) by mouth 2 (two) times daily. Take 30 minutes before breakfast 05/21/18   Derek Jack, MD  prochlorperazine (COMPAZINE) 10 MG tablet TAKE 1 TABLET BY MOUTH EVERY 6 HOURS AS NEEDED FOR NAUSEA AND VOMITING 08/19/18   Lockamy, Randi L, NP-C  Ramucirumab (CYRAMZA IV) Inject into the vein every 14 (fourteen) days. Fridays of every other week    [provider]  SSD 1 % cream APPLY CREAM ONCE A DAY AS INSTRUCTED 07/19/18   [provider]    Family History Family History  Problem Relation Age of Onset  . Prostate cancer Father   . Colon cancer Neg Hx   . Colon polyps Neg Hx     Social History Social History   Tobacco Use  . Smoking status: Former Smoker    Packs/day: 0.25    Types: Cigarettes  . Smokeless tobacco: Never Used  Substance Use Topics  . Alcohol use: No    Comment: hx heavy alcohol, hasn't drank in 19 yrs  . Drug use: Yes    Types: Marijuana    Comment: history of marijuana      Allergies   Patient has no known allergies.   Review of Systems Review of Systems  All other systems reviewed and are negative.    Physical Exam Updated Vital Signs BP (!) 142/90 (BP Location: Right Arm)   Pulse 85   Temp 98.1 F (36.7 C) (Oral)   Resp 16   Ht 1.854 m (6\' 1" )   Wt 91 kg   SpO2 100%   BMI 26.47 kg/m   Physical Exam Vitals signs and nursing note reviewed.  Constitutional:      General: He is not in acute distress.    Appearance: He is well-developed.  HENT:     Head: Normocephalic and atraumatic.     Right Ear: External ear normal.     Left Ear: External ear normal.  Eyes:     General: No scleral icterus.       Right eye: No discharge.        Left eye: No discharge.     Conjunctiva/sclera: Conjunctivae normal.  Neck:     Musculoskeletal: Neck supple.     Trachea: No tracheal deviation.  Cardiovascular:     Rate and Rhythm: Normal rate.  Pulmonary:     Effort: Pulmonary effort is normal. No respiratory distress.     Breath sounds: No stridor.  Abdominal:     General: There is no distension.   Musculoskeletal:        General: No swelling or deformity.  Skin:    General: Skin is warm and dry.     Findings: No rash.     Comments: 2 to 3 cm area of raised erythematous rash in the left shoulder region, no petechiae or purpura  Neurological:     Mental Status: He is alert.     Cranial Nerves: Cranial nerve deficit: no gross deficits.      ED Treatments / Results  Labs (all  labs ordered are listed, but only abnormal results are displayed) Labs Reviewed - No data to display  EKG None  Radiology No results found.  Procedures Procedures (including critical care time)  Medications Ordered in ED Medications - No data to display   Initial Impression / Assessment and Plan / ED Course  I have reviewed the triage vital signs and the nursing notes.  Pertinent labs & imaging results that were available during my care of the patient were reviewed by me and considered in my medical decision making (see chart for details).  Patient has a rash associated with recent tick bite.  he is asymptomatic and there is not a clear target lesion but I think it is reasonable to have him start a course of antibiotics.     Final Clinical Impressions(s) / ED Diagnoses   Final diagnoses:  Tick bite, initial encounter    ED Discharge Orders         Ordered    doxycycline (VIBRA-TABS) 100 MG tablet  2 times daily     11/24/18 0907           Dorie Rank, MD 11/24/18 [phone]

## 2018-11-24 NOTE — ED Triage Notes (Signed)
Pt states he was bitten by a tick on Friday on left shoulder.  Swelling and redness to area.

## 2018-11-28 ENCOUNTER — Other Ambulatory Visit (HOSPITAL_COMMUNITY): Payer: Self-pay | Admitting: Hematology

## 2018-11-28 ENCOUNTER — Other Ambulatory Visit: Payer: Self-pay

## 2018-11-28 DIAGNOSIS — Z7189 Other specified counseling: Secondary | ICD-10-CM | POA: Insufficient documentation

## 2018-11-29 ENCOUNTER — Inpatient Hospital Stay (HOSPITAL_COMMUNITY): Payer: Medicaid Other

## 2018-11-29 ENCOUNTER — Inpatient Hospital Stay (HOSPITAL_COMMUNITY): Payer: Medicaid Other | Attending: Hematology

## 2018-11-29 ENCOUNTER — Inpatient Hospital Stay (HOSPITAL_BASED_OUTPATIENT_CLINIC_OR_DEPARTMENT_OTHER): Payer: Medicaid Other | Admitting: Hematology

## 2018-11-29 ENCOUNTER — Encounter (HOSPITAL_COMMUNITY): Payer: Self-pay

## 2018-11-29 ENCOUNTER — Encounter (HOSPITAL_COMMUNITY): Payer: Self-pay | Admitting: Hematology

## 2018-11-29 VITALS — BP 148/92 | HR 76 | Temp 97.8°F | Resp 18 | Wt 200.6 lb

## 2018-11-29 DIAGNOSIS — R110 Nausea: Secondary | ICD-10-CM

## 2018-11-29 DIAGNOSIS — R634 Abnormal weight loss: Secondary | ICD-10-CM

## 2018-11-29 DIAGNOSIS — R911 Solitary pulmonary nodule: Secondary | ICD-10-CM | POA: Diagnosis not present

## 2018-11-29 DIAGNOSIS — Z5111 Encounter for antineoplastic chemotherapy: Secondary | ICD-10-CM

## 2018-11-29 DIAGNOSIS — G620 Drug-induced polyneuropathy: Secondary | ICD-10-CM | POA: Diagnosis not present

## 2018-11-29 DIAGNOSIS — Z87891 Personal history of nicotine dependence: Secondary | ICD-10-CM

## 2018-11-29 DIAGNOSIS — C159 Malignant neoplasm of esophagus, unspecified: Secondary | ICD-10-CM

## 2018-11-29 DIAGNOSIS — C770 Secondary and unspecified malignant neoplasm of lymph nodes of head, face and neck: Secondary | ICD-10-CM | POA: Diagnosis not present

## 2018-11-29 DIAGNOSIS — Z5112 Encounter for antineoplastic immunotherapy: Secondary | ICD-10-CM | POA: Diagnosis not present

## 2018-11-29 DIAGNOSIS — C155 Malignant neoplasm of lower third of esophagus: Secondary | ICD-10-CM | POA: Diagnosis present

## 2018-11-29 LAB — COMPREHENSIVE METABOLIC PANEL
ALT: 16 U/L (ref 0–44)
AST: 19 U/L (ref 15–41)
Albumin: 4.5 g/dL (ref 3.5–5.0)
Alkaline Phosphatase: 54 U/L (ref 38–126)
Anion gap: 9 (ref 5–15)
BUN: 13 mg/dL (ref 6–20)
CO2: 25 mmol/L (ref 22–32)
Calcium: 9.2 mg/dL (ref 8.9–10.3)
Chloride: 105 mmol/L (ref 98–111)
Creatinine, Ser: 0.91 mg/dL (ref 0.61–1.24)
GFR calc Af Amer: >60 mL/min (ref >60)
GFR calc non Af Amer: >60 mL/min (ref >60)
Glucose, Bld: 128 mg/dL — ABNORMAL HIGH (ref 70–99)
Potassium: 3.6 mmol/L (ref 3.5–5.1)
Sodium: 139 mmol/L (ref 135–145)
Total Bilirubin: 0.6 mg/dL (ref 0.3–1.2)
Total Protein: 8 g/dL (ref 6.5–8.1)

## 2018-11-29 LAB — CBC WITH DIFFERENTIAL/PLATELET
Abs Immature Granulocytes: 0.01 10*3/uL (ref 0.00–0.07)
Basophils Absolute: 0 10*3/uL (ref 0.0–0.1)
Basophils Relative: 1 %
Eosinophils Absolute: 0.1 10*3/uL (ref 0.0–0.5)
Eosinophils Relative: 2 %
HCT: 42.1 % (ref 39.0–52.0)
Hemoglobin: 14.1 g/dL (ref 13.0–17.0)
Immature Granulocytes: 0 %
Lymphocytes Relative: 32 %
Lymphs Abs: 1.9 10*3/uL (ref 0.7–4.0)
MCH: 25.7 pg — ABNORMAL LOW (ref 26.0–34.0)
MCHC: 33.5 g/dL (ref 30.0–36.0)
MCV: 76.7 fL — ABNORMAL LOW (ref 80.0–100.0)
Monocytes Absolute: 0.4 10*3/uL (ref 0.1–1.0)
Monocytes Relative: 6 %
Neutro Abs: 3.6 10*3/uL (ref 1.7–7.7)
Neutrophils Relative %: 59 %
Platelets: 186 10*3/uL (ref 150–400)
RBC: 5.49 MIL/uL (ref 4.22–5.81)
RDW: 14.4 % (ref 11.5–15.5)
WBC: 6.1 10*3/uL (ref 4.0–10.5)
nRBC: 0 % (ref 0.0–0.2)

## 2018-11-29 LAB — URINALYSIS, DIPSTICK ONLY
Bilirubin Urine: NEGATIVE
Glucose, UA: NEGATIVE mg/dL
Hgb urine dipstick: NEGATIVE
Ketones, ur: NEGATIVE mg/dL
Leukocytes,Ua: NEGATIVE
Nitrite: NEGATIVE
Protein, ur: 30 mg/dL — AB
Specific Gravity, Urine: 1.027 (ref 1.005–1.030)
pH: 5 (ref 5.0–8.0)

## 2018-11-29 MED ORDER — SODIUM CHLORIDE 0.9% FLUSH
10.0000 mL | Freq: Once | INTRAVENOUS | Status: AC
  Administered 2018-11-29: 10 mL

## 2018-11-29 MED ORDER — HEPARIN SOD (PORK) LOCK FLUSH 100 UNIT/ML IV SOLN
500.0000 [IU] | Freq: Once | INTRAVENOUS | Status: AC
  Administered 2018-11-29: 500 [IU] via INTRAVENOUS

## 2018-11-29 NOTE — Progress Notes (Addendum)
Nutrition Follow-up:  RD working remotely.  Patient with stage IV esophageal adenocarcinoma.  Patient receiving cyramza.    RD called and left message for patient to return call.  Patient returned RD's call within few minutes.  Reports that he went to GI doctor and then referred to GI doctor at [facility] which he saw last week and esophagus was stretched some.  Holding chemo for today and planning to start new chemo on Monday to shrink tumor.  Patient reports swallowing a little bit better after stretching.  Drinking about 4 ensure per day.  Reports able to eat salad, macaroni and cheese and chicken last night for dinner.      Medications: reviewed  Labs: reviewed  Anthropometrics:   Weight decreased to 200 lb today from 205 lb 3.2 oz on 3/20 and 210 lb in 07/2018   NUTRITION DIAGNOSIS: Inadequate oral intake decreased.   INTERVENTION:  Reviewed soft, moist foods with patient.  Encouraged high calorie foods and high protein foods.  Chopping, grinding, pureeing foods are options to increase intake.   Recipes of high calorie, high protein shakes given to patient on last visit Encouraged intake of ensure enlive for additional calories.  If unable to eat solid foods can drink 6-7 bottles per day to better meet nutritional needs. Will have case of ensure enlive ready for patient to pick up on Monday, 4/20 at next treatment.    MONITORING, EVALUATION, GOAL: weight trends, intake   NEXT VISIT: Friday, May 8 phone visit  Makhai Fulco B. Zenia Resides, [place], [place] Registered Dietitian [phone] (pager)

## 2018-11-29 NOTE — Progress Notes (Signed)
START ON PATHWAY REGIMEN - Gastroesophageal     A cycle is every 14 days:     Irinotecan   **Always confirm dose/schedule in your pharmacy ordering system**  Patient Characteristics: Distant Metastases (cM1/pM1) / Locally Recurrent Disease, Adenocarcinoma - Esophageal, GE Junction, and Gastric, Third Line and Beyond, MSS/pMMR or MSI Unknown, PD?L1 Expression Negative/Unknown or Prior PD-1/PD-L1 Inhibitor Histology: Adenocarcinoma Disease Classification: GE Junction Therapeutic Status: Distant Metastases (No Additional Staging) Line of Therapy: Third Engineer, civil (consulting) Status: MSS/pMMR PD-L1 Expression Status: Test Not Ordered Prior Immunotherapy Status: No Prior PD-1/PD-L1 Inhibitor Intent of Therapy: Non-Curative / Palliative Intent, Discussed with Patient

## 2018-11-29 NOTE — Assessment & Plan Note (Signed)
1.  Stage IV GE junction adenocarcinoma: - 6 cycles of FOLFOX from 02/15/2017 through 04/25/2017 -Paclitaxel and Cyramza from 05/18/2017, paclitaxel discontinued on 09/21/2017 secondary to neuropathy - Foundation 1 CDX showing MS-stable, TMB 3Muts/mb - CT scan on 11/28/2017 showing 4 mm right lower lobe nodule which is new, with stable disease elsewhere. - He was admitted to the hospital from 12/10/2017 through 12/12/2017 with pneumonia from parainfluenza 3. - CT scan on 03/22/2018 showed resolution of the right supraclavicular adenopathy and mediastinal adenopathy.  Residual soft tissue thickening involving the distal GE junction with no obvious mass.  Largely resolved upper abdominal adenopathy.  There is a persistent partially necrotic celiac axis lymph node which is stable.  No pulmonary metastatic disease. -CT abdomen pelvis on 08/23/2018 shows necrotic nodes with peripheral rim enhancement, largest measuring 19 mm, increased from 18 mm previously.  There is another lymph node which increased to 12 mm from 8 mm previously.  No new lesions seen.  Thickening of the GE junction is stable. -He reportedly had weight loss of 10 pounds since January.  He did have occasional episodes of regurgitation. - He was evaluated by Dr. Truddie Crumble at [facility].  EGD was done on 11/25/2018. -I talked to Dr. Truddie Crumble about the findings on the EGD.  There was clear progression of the mass.  Dr. Truddie Crumble has recommended changing therapy.  If there is no improvement he will place a stent. - We will discontinue Cyramza.  Given his peripheral neuropathy, I am reluctant to consider paclitaxel at this time. - We will consider rebiopsy at next EGD for PDL 1 testing. -At this time I have recommended Irinotecan based chemotherapy in the form of FOLFIRI.  We talked about side effects in detail.  We plan to obtain baseline CT CAP prior to initiation of therapy.  We will plan to repeat scans after 2 to 3 months after initiation.  2.  Peripheral  neuropathy: -This is from prior paclitaxel which was discontinued on 09/21/2017. -He has bilateral toe numbness with occasional pains.  He is taking gabapentin 3 times a day.

## 2018-11-29 NOTE — Patient Instructions (Addendum)
[place] at [facility] Discharge Instructions  You were seen today by Dr. Delton Coombes. He went over your recent scan results, it showed that you had some progression and we will need to change your treatment. He will schedule you for a scans to get baseline imaging. He will see you back in 1 week for labs, treatment and follow up.   Thank you for choosing [place] at [facility] to provide your oncology and hematology care.  To afford each patient quality time with our provider, please arrive at least 15 minutes before your scheduled appointment time.   If you have a lab appointment with the [facility] please come in thru the  Main Entrance and check in at the main information desk  You need to re-schedule your appointment should you arrive 10 or more minutes late.  We strive to give you quality time with our providers, and arriving late affects you and other patients whose appointments are after yours.  Also, if you no show three or more times for appointments you may be dismissed from the clinic at the providers discretion.     Again, thank you for choosing [facility].  Our hope is that these requests will decrease the amount of time that you wait before being seen by our physicians.       _____________________________________________________________  Should you have questions after your visit to [facility], please contact our office at [phone] between the hours of 8:00 a.m. and 4:30 p.m.  Voicemails left after 4:00 p.m. will not be returned until the following business day.  For prescription refill requests, have your pharmacy contact our office and allow 72 hours.    Cancer Center Support Programs:   > Cancer Support Group  2nd Tuesday of the month 1pm-2pm, Journey Room

## 2018-11-29 NOTE — Progress Notes (Signed)
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 Barney Drain. Partially obstructing, likely malignant esophageal tumor was found in the distal esophagus. Biopsied with surgical path demonstrating Adenocarcinoma. Injected. Treated with argon plasma coagulation (APC) #3. 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 Biopsied with surgical path demonstrating Adenocarcinoma.    01/19/2017 PET scan    NECK Two FDG avid nodes are seen in the base of the neck on the right. The first is seen on CT image 35, just posterior to the right thyroid lobe and the other is seen on CT image 41 measuring 16 mm in short axis with a maximum SUV of 8.6.  CHEST  The patient's known malignancy involving the distal esophagus and cardia of the stomach is FDG avid with a maximum SUV of 10.4. No definitive metastatic nodes within the chest.  ABDOMEN/PELVIS  FDG avid metastatic adenopathy is seen in the gastrohepatic ligament, paraceliac nodes, posterior to the IVC on image 111, and to the left of the SMA on image 112. The most inferior node is seen in the aortocaval region on image 117. The representative node to the left of the SMA was measured on series 4, image 111 measuring 2.5 by 2.1 cm with a maximum SUV of 7. No other FDG avid disease in the abdomen or pelvis. Cholelithiasis is identified.    02/02/2017 Procedure    US guided bx of right supraclavicular LN by IR.    02/05/2017 Pathology Results    Lymph node, needle/core biopsy, Right Supraclavicular -  METASTATIC POORLY DIFFERENTIATED CARCINOMA.    02/10/2017 Pathology Results    HER2 - NEGATIVE    02/13/2017 Procedure    Port placed by IR    02/15/2017 - 04/25/2017 Chemotherapy    FOLFOX x 6 cycles     05/02/2017 Imaging    CT C/A/P: IMPRESSION: 1. Nodular thickening and hyperenhancement in the distal esophagus and gastric cardia could reflect residual tumor. 2. No significant change in size of previously demonstrated hypermetabolic upper abdominal lymph nodes. No progressive adenopathy identified. No residual enlarged supraclavicular nodes are seen. 3. No evidence of progressive metastatic disease. No worrisome hepatic findings. 4. Tiny right upper lobe pulmonary nodule, not clearly seen on low dose previous PET-CT. Attention on follow-up recommended. 5. Cholelithiasis.    12/02/2018 -  Chemotherapy    The patient had palonosetron (ALOXI) injection 0.25 mg, 0.25 mg, Intravenous,  Once, 0 of 4 cycles irinotecan (CAMPTOSAR) 380 mg in dextrose 5 % 500 mL chemo infusion, 180 mg/m2 = 380 mg, Intravenous,  Once, 0 of 4 cycles leucovorin 864 mg in dextrose 5 % 250 mL infusion, 400 mg/m2 = 864 mg, Intravenous,  Once, 0 of 4 cycles fluorouracil (ADRUCIL) chemo injection 850 mg, 400 mg/m2 = 850 mg, Intravenous,  Once, 0 of 4 cycles fluorouracil (ADRUCIL) 5,200 mg in sodium chloride 0.9 % 146 mL chemo infusion, 2,400 mg/m2 = 5,200 mg, Intravenous, 1 Day/Dose, 0 of 4 cycles  for chemotherapy treatment.       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 Storlie 53 y.o. male returns for routine follow-up and consideration for next cycle of chemotherapy. He is here today alone. He states that he has experienced nausea since his last visit. He states that he is afraid to eat due  to food feeling like it gets hung or blocked. He states that he was recently started on Doxycycline for a tick bite, will complete on 12/01/18. Denies any vomiting, or diarrhea. Denies any new pains. Had not noticed any recent bleeding such as epistaxis, hematuria or hematochezia. Denies recent chest pain on exertion, shortness of breath on minimal exertion, pre-syncopal episodes, or palpitations. Denies any numbness or tingling in hands or feet. Denies any recent fevers, infections, or recent hospitalizations. Patient reports appetite at 50% and energy level at 75%.   REVIEW OF SYSTEMS:  Review of Systems  Gastrointestinal: Positive for nausea.  All other systems reviewed and are negative.    PAST MEDICAL/SURGICAL HISTORY:  Past Medical History:  Diagnosis Date  . Hypertension   . 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 ESOPHAGOGASTRODUODENOSCOPY (EGD) WITH PROPOFOL N/A 01/16/2017   Procedure: ESOPHAGOGASTRODUODENOSCOPY (EGD) WITH PROPOFOL;  Surgeon: [name], MD;  Location: [facility];  Service: Endoscopy;  Laterality: N/A;  730   . IR FLUORO GUIDE PORT INSERTION RIGHT  02/13/2017  . IR US GUIDE VASC ACCESS RIGHT  02/13/2017  . lipoma removal    . PORTA CATH INSERTION Right 02/13/2017  . SAVORY DILATION N/A 01/16/2017   Procedure: SAVORY DILATION;  Surgeon: [name], MD;  Location: [facility];  Service: Endoscopy;  Laterality: N/A;     SOCIAL HISTORY:  Social History   Socioeconomic History  . Marital status: Married    Spouse name: Not on file  . Number of children: Not on file  . Years of education: Not on file  . Highest education level: Not on file  Occupational History  . Not on file  Social Needs  . Financial resource strain: Not on file  . Food insecurity:    Worry: Not on file    Inability: Not on file  . Transportation  needs:    Medical: Not on file    Non-medical: Not on file  Tobacco Use  . Smoking status: Former Smoker    Packs/day: 0.25    Types: Cigarettes  . Smokeless tobacco: Never Used  Substance and Sexual Activity  . Alcohol use: No    Comment: hx heavy alcohol, hasn't drank in 19 yrs  . Drug use: Yes    Types: Marijuana    Comment: history of marijuana   . Sexual activity: Yes  Lifestyle  . Physical activity:    Days per week: Not on file    Minutes per session: Not on file  . Stress: Not on file  Relationships  . Social connections:    Talks on phone: Not on file    Gets together: Not on file    Attends religious service: Not on file    Active member of club or organization: Not on file    Attends meetings of clubs or organizations: Not on file    Relationship status: Not on file  . Intimate partner  violence:    Fear of current or ex partner: Not on file    Emotionally abused: Not on file    Physically abused: Not on file    Forced sexual activity: Not on file  Other Topics Concern  . Not on file  Social History Narrative   WORKING [employer].    FAMILY HISTORY:  Family History  Problem Relation Age of Onset  . Prostate cancer Father   . Colon cancer Neg Hx   . Colon polyps Neg Hx     CURRENT MEDICATIONS:  Outpatient Encounter Medications as of 11/29/2018  Medication Sig  . acetaminophen (TYLENOL) 325 MG tablet Take 2 tablets (650 mg total) by mouth every 4 (four) hours as needed for headache.  . Alpha-Lipoic Acid 600 MG CAPS Take 1 capsule (600 mg total) by mouth daily.  Marland Kitchen amLODipine (NORVASC) 10 MG tablet TAKE 1 TABLET BY MOUTH ONCE DAILY  . B Complex-C (B-COMPLEX WITH VITAMIN C) tablet Take 1 tablet by mouth daily.  Marland Kitchen dexamethasone (DECADRON) 4 MG tablet Take 8 mg by mouth 2 (two) times daily.  Marland Kitchen doxycycline (VIBRA-TABS) 100 MG tablet Take 1 tablet (100 mg total) by mouth 2 (two) times daily for 7 days.  Marland Kitchen gabapentin (NEURONTIN) 300 MG capsule  Take 1 capsule by mouth twice daily  . lidocaine-prilocaine (EMLA) cream Apply 1 application topically as needed.  . loratadine (CLARITIN) 10 MG tablet Take 1 tablet (10 mg total) by mouth daily.  . Misc. Devices MISC Please provide the patient with the [facility] HPB cough Syrup. 1 teaspoonful every 4-6 hours PRN. Please provide the patient with 360 ml.  . Morphine Sulfate (MORPHINE CONCENTRATE) 10 mg / 0.5 ml concentrated solution Take 0.5 mLs (10 mg total) by mouth every 4 (four) hours as needed for severe pain.  . pantoprazole (PROTONIX) 40 MG tablet Take 1 tablet (40 mg total) by mouth 2 (two) times daily. Take 30 minutes before breakfast  . prochlorperazine (COMPAZINE) 10 MG tablet TAKE 1 TABLET BY MOUTH EVERY 6 HOURS AS NEEDED FOR NAUSEA AND VOMITING  . Ramucirumab (CYRAMZA IV) Inject into the vein every 14 (fourteen) days. Fridays of every other week  . SSD 1 % cream APPLY CREAM ONCE A DAY AS INSTRUCTED   No facility-administered encounter medications on file as of 11/29/2018.     ALLERGIES:  No Known Allergies   PHYSICAL EXAM:  ECOG Performance status: 1  Vitals:   11/29/18 0751  BP: (!) 148/92  Pulse: 76  Resp: 18  Temp: 97.8 F (36.6 C)  SpO2: 100%   Filed Weights   11/29/18 0751  Weight: 200 lb 9.6 oz (91 kg)    Physical Exam Vitals signs reviewed.  Constitutional:      Appearance: Normal appearance.  Cardiovascular:     Rate and Rhythm: Normal rate and regular rhythm.     Heart sounds: Normal heart sounds.  Pulmonary:     Effort: Pulmonary effort is normal.     Breath sounds: Normal breath sounds.  Abdominal:     General: There is no distension.     Palpations: Abdomen is soft. There is no mass.  Musculoskeletal:        General: No swelling.  Skin:    General: Skin is warm.  Neurological:     General: No focal deficit present.     Mental Status: He is alert and oriented to person, place, and time.  Psychiatric:  Mood and Affect: Mood normal.         Behavior: Behavior normal.      LABORATORY DATA:  I have reviewed the labs as listed.  CBC    Component Value Date/Time   WBC 6.1 11/29/2018 0747   RBC 5.49 11/29/2018 0747   HGB 14.1 11/29/2018 0747   HCT 42.1 11/29/2018 0747   PLT 186 11/29/2018 0747   MCV 76.7 (L) 11/29/2018 0747   MCH 25.7 (L) 11/29/2018 0747   MCHC 33.5 11/29/2018 0747   RDW 14.4 11/29/2018 0747   LYMPHSABS 1.9 11/29/2018 0747   MONOABS 0.4 11/29/2018 0747   EOSABS 0.1 11/29/2018 0747   BASOSABS 0.0 11/29/2018 0747   CMP Latest Ref Rng & Units 11/29/2018 11/15/2018 11/01/2018  Glucose 70 - 99 mg/dL 128(H) 97 158(H)  BUN 6 - 20 mg/dL '13 12 8  ' Creatinine 0.61 - 1.24 mg/dL 0.91 0.82 0.94  Sodium 135 - 145 mmol/L 139 137 139  Potassium 3.5 - 5.1 mmol/L 3.6 3.7 3.6  Chloride 98 - 111 mmol/L 105 104 106  CO2 22 - 32 mmol/L '25 25 26  ' Calcium 8.9 - 10.3 mg/dL 9.2 9.2 8.9  Total Protein 6.5 - 8.1 g/dL 8.0 7.7 7.1  Total Bilirubin 0.3 - 1.2 mg/dL 0.6 0.3 0.6  Alkaline Phos 38 - 126 U/L 54 54 58  AST 15 - 41 U/L '19 16 22  ' ALT 0 - 44 U/L '16 14 16       ' DIAGNOSTIC IMAGING:  I have independently reviewed the scans and discussed with the patient.   I have reviewed Venita Lick LPN's note and agree with the documentation.  I personally performed a face-to-face visit, made revisions and my assessment and plan is as follows.    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  Stage IV GE junction adenocarcinoma: - 6 cycles of FOLFOX from 02/15/2017 through 04/25/2017 -Paclitaxel and Cyramza from 05/18/2017, paclitaxel discontinued on 09/21/2017 secondary to neuropathy - Foundation 1 CDX showing MS-stable, TMB 3Muts/mb - CT scan on 11/28/2017 showing 4 mm right lower lobe nodule which is new, with stable disease elsewhere. - He was admitted to the hospital from 12/10/2017 through 12/12/2017 with pneumonia from parainfluenza 3. - CT scan on 03/22/2018 showed resolution of the right supraclavicular  adenopathy and mediastinal adenopathy.  Residual soft tissue thickening involving the distal GE junction with no obvious mass.  Largely resolved upper abdominal adenopathy.  There is a persistent partially necrotic celiac axis lymph node which is stable.  No pulmonary metastatic disease. -CT abdomen pelvis on 08/23/2018 shows necrotic nodes with peripheral rim enhancement, largest measuring 19 mm, increased from 18 mm previously.  There is another lymph node which increased to 12 mm from 8 mm previously.  No new lesions seen.  Thickening of the GE junction is stable. -He reportedly had weight loss of 10 pounds since January.  He did have occasional episodes of regurgitation. - He was evaluated by Dr. Truddie Crumble at [facility].  EGD was done on 11/25/2018. -I talked to Dr. Truddie Crumble about the findings on the EGD.  There was clear progression of the mass.  Dr. Truddie Crumble has recommended changing therapy.  If there is no improvement he will place a stent. - We will discontinue Cyramza.  Given his peripheral neuropathy, I am reluctant to consider paclitaxel at this time. - We will consider rebiopsy at next EGD for PDL 1 testing. -At this time I have recommended Irinotecan based chemotherapy in  the form of FOLFIRI.  We talked about side effects in detail.  We plan to obtain baseline CT CAP prior to initiation of therapy.  We will plan to repeat scans after 2 to 3 months after initiation.  2.  Peripheral neuropathy: -This is from prior paclitaxel which was discontinued on 09/21/2017. -He has bilateral toe numbness with occasional pains.  He is taking gabapentin 3 times a day.      Total time spent is 40 minutes with more than 50% of the time spent face-to-face discussing EGD results, further treatment recommendations, side effects and coordination of care.    Orders placed this encounter:  Orders Placed This Encounter  Procedures  . CT Abdomen Pelvis W Contrast  . CT Chest W Contrast      Derek Jack, MD [place] [phone]

## 2018-11-29 NOTE — Telephone Encounter (Signed)
Called [facility] to f/u on referral. Pt had video visit w/Dr. Truddie Crumble 11/22/18 and EGD was done 11/25/18.

## 2018-11-29 NOTE — Progress Notes (Signed)
Pt seen by Dr. Delton Coombes today. VO no treatment today. Pt to start new treatment on Wed of next week. Pt teaching performed. Understanding verbalized.

## 2018-11-30 ENCOUNTER — Other Ambulatory Visit (HOSPITAL_COMMUNITY): Payer: Self-pay | Admitting: Nurse Practitioner

## 2018-11-30 DIAGNOSIS — C159 Malignant neoplasm of esophagus, unspecified: Secondary | ICD-10-CM

## 2018-12-02 ENCOUNTER — Inpatient Hospital Stay (HOSPITAL_COMMUNITY): Payer: Medicaid Other

## 2018-12-02 ENCOUNTER — Other Ambulatory Visit: Payer: Self-pay

## 2018-12-02 ENCOUNTER — Encounter (HOSPITAL_COMMUNITY): Payer: Self-pay

## 2018-12-02 ENCOUNTER — Other Ambulatory Visit (HOSPITAL_COMMUNITY): Payer: Self-pay | Admitting: *Deleted

## 2018-12-02 VITALS — BP 125/82 | HR 60 | Temp 97.4°F | Resp 18 | Wt 201.6 lb

## 2018-12-02 DIAGNOSIS — Z5112 Encounter for antineoplastic immunotherapy: Secondary | ICD-10-CM | POA: Diagnosis not present

## 2018-12-02 DIAGNOSIS — C159 Malignant neoplasm of esophagus, unspecified: Secondary | ICD-10-CM

## 2018-12-02 MED ORDER — SODIUM CHLORIDE 0.9 % IV SOLN
2320.0000 mg/m2 | INTRAVENOUS | Status: DC
  Administered 2018-12-02: 5000 mg via INTRAVENOUS
  Filled 2018-12-02: qty 100

## 2018-12-02 MED ORDER — IRINOTECAN HCL CHEMO INJECTION 100 MG/5ML
180.0000 mg/m2 | Freq: Once | INTRAVENOUS | Status: AC
  Administered 2018-12-02: 380 mg via INTRAVENOUS
  Filled 2018-12-02: qty 15

## 2018-12-02 MED ORDER — FLUOROURACIL CHEMO INJECTION 2.5 GM/50ML
400.0000 mg/m2 | Freq: Once | INTRAVENOUS | Status: AC
  Administered 2018-12-02: 13:00:00 850 mg via INTRAVENOUS
  Filled 2018-12-02: qty 17

## 2018-12-02 MED ORDER — PALONOSETRON HCL INJECTION 0.25 MG/5ML
0.2500 mg | Freq: Once | INTRAVENOUS | Status: AC
  Administered 2018-12-02: 0.25 mg via INTRAVENOUS
  Filled 2018-12-02: qty 5

## 2018-12-02 MED ORDER — SODIUM CHLORIDE 0.9 % IV SOLN
Freq: Once | INTRAVENOUS | Status: AC
  Administered 2018-12-02: 10:00:00 via INTRAVENOUS

## 2018-12-02 MED ORDER — SODIUM CHLORIDE 0.9 % IV SOLN
10.0000 mg | Freq: Once | INTRAVENOUS | Status: AC
  Administered 2018-12-02: 10 mg via INTRAVENOUS
  Filled 2018-12-02: qty 10

## 2018-12-02 MED ORDER — LEUCOVORIN CALCIUM INJECTION 350 MG
400.0000 mg/m2 | Freq: Once | INTRAVENOUS | Status: AC
  Administered 2018-12-02: 864 mg via INTRAVENOUS
  Filled 2018-12-02: qty 43.2

## 2018-12-02 MED ORDER — ATROPINE SULFATE 1 MG/ML IJ SOLN
0.5000 mg | Freq: Once | INTRAMUSCULAR | Status: AC | PRN
  Administered 2018-12-02: 0.5 mg via INTRAVENOUS
  Filled 2018-12-02: qty 1

## 2018-12-02 NOTE — Progress Notes (Signed)
Treatment given today per MD orders. Tolerated infusion without adverse affects. Vital signs stable. No complaints at this time. Discharged from clinic ambulatory. 5FU pump infusing per protocol. RUN on pump present.  F/U with [facility] as scheduled.

## 2018-12-02 NOTE — Patient Instructions (Signed)
[facility] Discharge Instructions for Patients Receiving Chemotherapy  Today you received the following chemotherapy agents   To help prevent nausea and vomiting after your treatment, we encourage you to take your nausea medication   If you develop nausea and vomiting that is not controlled by your nausea medication, call the clinic.   BELOW ARE SYMPTOMS THAT SHOULD BE REPORTED IMMEDIATELY:  *FEVER GREATER THAN 100.5 F  *CHILLS WITH OR WITHOUT FEVER  NAUSEA AND VOMITING THAT IS NOT CONTROLLED WITH YOUR NAUSEA MEDICATION  *UNUSUAL SHORTNESS OF BREATH  *UNUSUAL BRUISING OR BLEEDING  TENDERNESS IN MOUTH AND THROAT WITH OR WITHOUT PRESENCE OF ULCERS  *URINARY PROBLEMS  *BOWEL PROBLEMS  UNUSUAL RASH Items with * indicate a potential emergency and should be followed up as soon as possible.  Feel free to call the clinic should you have any questions or concerns. The clinic phone number is [phone].  Please show the CHEMO ALERT CARD at check-in to the Emergency Department and triage nurse.   

## 2018-12-03 ENCOUNTER — Inpatient Hospital Stay (HOSPITAL_COMMUNITY): Payer: Medicaid Other

## 2018-12-03 ENCOUNTER — Other Ambulatory Visit (HOSPITAL_COMMUNITY): Payer: Self-pay | Admitting: *Deleted

## 2018-12-03 ENCOUNTER — Encounter (HOSPITAL_COMMUNITY): Payer: Self-pay

## 2018-12-03 VITALS — BP 147/87 | HR 77 | Temp 98.6°F | Resp 18

## 2018-12-03 DIAGNOSIS — Z5112 Encounter for antineoplastic immunotherapy: Secondary | ICD-10-CM | POA: Diagnosis not present

## 2018-12-03 DIAGNOSIS — C159 Malignant neoplasm of esophagus, unspecified: Secondary | ICD-10-CM

## 2018-12-03 DIAGNOSIS — D500 Iron deficiency anemia secondary to blood loss (chronic): Secondary | ICD-10-CM

## 2018-12-03 MED ORDER — SODIUM CHLORIDE 0.9 % IV SOLN: Freq: Once | INTRAVENOUS | Status: DC

## 2018-12-03 MED ORDER — SODIUM CHLORIDE 0.9 % IV SOLN
8.0000 mg | Freq: Once | INTRAVENOUS | Status: AC
  Administered 2018-12-03: 8 mg via INTRAVENOUS
  Filled 2018-12-03: qty 0.8

## 2018-12-03 MED ORDER — SODIUM CHLORIDE 0.9% FLUSH
10.0000 mL | INTRAVENOUS | Status: DC | PRN
  Administered 2018-12-03: 10 mL via INTRAVENOUS
  Filled 2018-12-03: qty 10

## 2018-12-03 MED ORDER — METOCLOPRAMIDE HCL 5 MG/ML IJ SOLN
10.0000 mg | Freq: Once | INTRAMUSCULAR | Status: AC
  Administered 2018-12-03: 10 mg via INTRAVENOUS
  Filled 2018-12-03: qty 2

## 2018-12-03 MED ORDER — SODIUM CHLORIDE 0.9 % IV SOLN
Freq: Once | INTRAVENOUS | Status: AC
  Administered 2018-12-03: 14:00:00 via INTRAVENOUS
  Filled 2018-12-03: qty 1000

## 2018-12-03 NOTE — Patient Instructions (Signed)
[place] at [facility] Discharge Instructions  Received hydration with magnesium and potassium as well as Decadron and Reglan IV. Follow-up as scheduled. Call clinic for any questions or concerns   Thank you for choosing [place] at [facility] to provide your oncology and hematology care.  To afford each patient quality time with our provider, please arrive at least 15 minutes before your scheduled appointment time.   If you have a lab appointment with the [facility] please come in thru the  Main Entrance and check in at the main information desk  You need to re-schedule your appointment should you arrive 10 or more minutes late.  We strive to give you quality time with our providers, and arriving late affects you and other patients whose appointments are after yours.  Also, if you no show three or more times for appointments you may be dismissed from the clinic at the providers discretion.     Again, thank you for choosing [facility].  Our hope is that these requests will decrease the amount of time that you wait before being seen by our physicians.       _____________________________________________________________  Should you have questions after your visit to [facility], please contact our office at [phone] between the hours of 8:00 a.m. and 4:30 p.m.  Voicemails left after 4:00 p.m. will not be returned until the following business day.  For prescription refill requests, have your pharmacy contact our office and allow 72 hours.    Cancer Center Support Programs:   > Cancer Support Group  2nd Tuesday of the month 1pm-2pm, Journey Room

## 2018-12-03 NOTE — Progress Notes (Signed)
1300 Pt to clinic today for hydration with magnesium and potassium over 2 hours as well as Reglan and decadron IV after pt called reporting vomiting every time he eats or drinks anything since receiving chemotherapy tx yesterday. Pt presently has 5FU pump infusing thru portacath.                                    Maxwell Henderson tolerated hydration well without complaints or incident. VSS upon discharge. Pt discharged with 5FU pump infusing without issues. Pt discharged self ambulatory in satisfactory condition

## 2018-12-03 NOTE — Progress Notes (Signed)
12/03/18  Received order to give zofran 8 mg IVPB, however patient received Aloxi 12/02/18.  Order received to DC zofran and give dexamethasone 8 mg IVPB and Reglan 10 mg IVpush.  Also received order to give NS 1 liter + Potassium Chloride 45mEq + magnesium sulfate 2 gm over 2 hours.  T.O. Dr Beatrix Fetters RN/Bhavana Kady Ronnald Ramp, PharmD

## 2018-12-04 ENCOUNTER — Inpatient Hospital Stay (HOSPITAL_COMMUNITY): Payer: Medicaid Other

## 2018-12-04 ENCOUNTER — Other Ambulatory Visit: Payer: Self-pay

## 2018-12-04 VITALS — BP 142/92 | HR 72 | Temp 98.4°F | Resp 18

## 2018-12-04 DIAGNOSIS — Z5112 Encounter for antineoplastic immunotherapy: Secondary | ICD-10-CM | POA: Diagnosis not present

## 2018-12-04 DIAGNOSIS — C159 Malignant neoplasm of esophagus, unspecified: Secondary | ICD-10-CM

## 2018-12-04 MED ORDER — SODIUM CHLORIDE 0.9% FLUSH
10.0000 mL | INTRAVENOUS | Status: DC | PRN
  Administered 2018-12-04: 10 mL
  Filled 2018-12-04: qty 10

## 2018-12-04 MED ORDER — HEPARIN SOD (PORK) LOCK FLUSH 100 UNIT/ML IV SOLN
500.0000 [IU] | Freq: Once | INTRAVENOUS | Status: AC | PRN
  Administered 2018-12-04: 500 [IU]

## 2018-12-04 NOTE — Patient Instructions (Signed)
[facility] at [facility]  Discharge Instructions:   _______________________________________________________________  Thank you for choosing [facility] at [facility] to provide your oncology and hematology care.  To afford each patient quality time with our providers, please arrive at least 15 minutes before your scheduled appointment.  You need to re-schedule your appointment if you arrive 10 or more minutes late.  We strive to give you quality time with our providers, and arriving late affects you and other patients whose appointments are after yours.  Also, if you no show three or more times for appointments you may be dismissed from the clinic.  Again, thank you for choosing [facility] at [facility]. Our hope is that these requests will allow you access to exceptional care and in a timely manner. _______________________________________________________________  If you have questions after your visit, please contact our office at [phone] between the hours of 8:30 a.m. and 5:00 p.m. Voicemails left after 4:30 p.m. will not be returned until the following business day. _______________________________________________________________  For prescription refill requests, have your pharmacy contact our office. _______________________________________________________________  Recommendations made by the consultant and any test results will be sent to your referring physician. _______________________________________________________________ 

## 2018-12-05 ENCOUNTER — Other Ambulatory Visit (HOSPITAL_COMMUNITY): Payer: Self-pay | Admitting: Hematology

## 2018-12-05 DIAGNOSIS — C159 Malignant neoplasm of esophagus, unspecified: Secondary | ICD-10-CM

## 2018-12-06 ENCOUNTER — Inpatient Hospital Stay (HOSPITAL_BASED_OUTPATIENT_CLINIC_OR_DEPARTMENT_OTHER): Payer: Medicaid Other | Admitting: Hematology

## 2018-12-06 ENCOUNTER — Ambulatory Visit (HOSPITAL_COMMUNITY): Payer: Medicaid Other

## 2018-12-06 ENCOUNTER — Other Ambulatory Visit: Payer: Self-pay

## 2018-12-06 ENCOUNTER — Encounter (HOSPITAL_COMMUNITY): Payer: Self-pay | Admitting: Hematology

## 2018-12-06 ENCOUNTER — Inpatient Hospital Stay (HOSPITAL_COMMUNITY): Payer: Medicaid Other

## 2018-12-06 VITALS — BP 138/89 | HR 61 | Temp 98.2°F | Resp 18 | Wt 191.4 lb

## 2018-12-06 DIAGNOSIS — R1013 Epigastric pain: Secondary | ICD-10-CM | POA: Diagnosis not present

## 2018-12-06 DIAGNOSIS — K5900 Constipation, unspecified: Secondary | ICD-10-CM

## 2018-12-06 DIAGNOSIS — R911 Solitary pulmonary nodule: Secondary | ICD-10-CM

## 2018-12-06 DIAGNOSIS — C155 Malignant neoplasm of lower third of esophagus: Secondary | ICD-10-CM

## 2018-12-06 DIAGNOSIS — C770 Secondary and unspecified malignant neoplasm of lymph nodes of head, face and neck: Secondary | ICD-10-CM

## 2018-12-06 DIAGNOSIS — Z87891 Personal history of nicotine dependence: Secondary | ICD-10-CM

## 2018-12-06 DIAGNOSIS — R634 Abnormal weight loss: Secondary | ICD-10-CM

## 2018-12-06 DIAGNOSIS — R197 Diarrhea, unspecified: Secondary | ICD-10-CM

## 2018-12-06 DIAGNOSIS — D500 Iron deficiency anemia secondary to blood loss (chronic): Secondary | ICD-10-CM

## 2018-12-06 DIAGNOSIS — C159 Malignant neoplasm of esophagus, unspecified: Secondary | ICD-10-CM

## 2018-12-06 DIAGNOSIS — Z5112 Encounter for antineoplastic immunotherapy: Secondary | ICD-10-CM | POA: Diagnosis not present

## 2018-12-06 DIAGNOSIS — R1032 Left lower quadrant pain: Secondary | ICD-10-CM

## 2018-12-06 LAB — CBC WITH DIFFERENTIAL/PLATELET
Abs Immature Granulocytes: 0.01 10*3/uL (ref 0.00–0.07)
Basophils Absolute: 0 10*3/uL (ref 0.0–0.1)
Basophils Relative: 1 %
Eosinophils Absolute: 0.2 10*3/uL (ref 0.0–0.5)
Eosinophils Relative: 3 %
HCT: 42.2 % (ref 39.0–52.0)
Hemoglobin: 14.3 g/dL (ref 13.0–17.0)
Immature Granulocytes: 0 %
Lymphocytes Relative: 47 %
Lymphs Abs: 2.4 10*3/uL (ref 0.7–4.0)
MCH: 26 pg (ref 26.0–34.0)
MCHC: 33.9 g/dL (ref 30.0–36.0)
MCV: 76.7 fL — ABNORMAL LOW (ref 80.0–100.0)
Monocytes Absolute: 0.2 10*3/uL (ref 0.1–1.0)
Monocytes Relative: 3 %
Neutro Abs: 2.3 10*3/uL (ref 1.7–7.7)
Neutrophils Relative %: 46 %
Platelets: 169 10*3/uL (ref 150–400)
RBC: 5.5 MIL/uL (ref 4.22–5.81)
RDW: 14.3 % (ref 11.5–15.5)
WBC: 5.1 10*3/uL (ref 4.0–10.5)
nRBC: 0 % (ref 0.0–0.2)

## 2018-12-06 LAB — COMPREHENSIVE METABOLIC PANEL
ALT: 26 U/L (ref 0–44)
AST: 31 U/L (ref 15–41)
Albumin: 4.4 g/dL (ref 3.5–5.0)
Alkaline Phosphatase: 56 U/L (ref 38–126)
Anion gap: 8 (ref 5–15)
BUN: 13 mg/dL (ref 6–20)
CO2: 25 mmol/L (ref 22–32)
Calcium: 8.7 mg/dL — ABNORMAL LOW (ref 8.9–10.3)
Chloride: 103 mmol/L (ref 98–111)
Creatinine, Ser: 0.99 mg/dL (ref 0.61–1.24)
GFR calc Af Amer: >60 mL/min (ref >60)
GFR calc non Af Amer: >60 mL/min (ref >60)
Glucose, Bld: 110 mg/dL — ABNORMAL HIGH (ref 70–99)
Potassium: 3.7 mmol/L (ref 3.5–5.1)
Sodium: 136 mmol/L (ref 135–145)
Total Bilirubin: 0.8 mg/dL (ref 0.3–1.2)
Total Protein: 7.5 g/dL (ref 6.5–8.1)

## 2018-12-06 MED ORDER — SODIUM CHLORIDE 0.9% FLUSH
10.0000 mL | Freq: Once | INTRAVENOUS | Status: AC | PRN
  Administered 2018-12-06: 10 mL

## 2018-12-06 MED ORDER — HYOSCYAMINE SULFATE SL 0.125 MG SL SUBL: 1.0000 | SUBLINGUAL_TABLET | SUBLINGUAL | 1 refills | Status: DC | PRN

## 2018-12-06 MED ORDER — HYOSCYAMINE SULFATE 0.125 MG SL SUBL
0.1250 mg | SUBLINGUAL_TABLET | Freq: Once | SUBLINGUAL | Status: AC
  Administered 2018-12-06: 0.125 mg via SUBLINGUAL
  Filled 2018-12-06: qty 1

## 2018-12-06 MED ORDER — SODIUM CHLORIDE 0.9% FLUSH: 10.0000 mL | INTRAVENOUS | Status: DC | PRN

## 2018-12-06 MED ORDER — METOCLOPRAMIDE HCL 5 MG/ML IJ SOLN
10.0000 mg | Freq: Once | INTRAMUSCULAR | Status: AC
  Administered 2018-12-06: 10 mg via INTRAVENOUS
  Filled 2018-12-06: qty 2

## 2018-12-06 MED ORDER — SODIUM CHLORIDE 0.9 % IV SOLN: Freq: Once | INTRAVENOUS | Status: DC

## 2018-12-06 MED ORDER — HEPARIN SOD (PORK) LOCK FLUSH 100 UNIT/ML IV SOLN
500.0000 [IU] | Freq: Once | INTRAVENOUS | Status: AC | PRN
  Administered 2018-12-06: 500 [IU]

## 2018-12-06 MED ORDER — HEPARIN SOD (PORK) LOCK FLUSH 100 UNIT/ML IV SOLN: 500.0000 [IU] | Freq: Once | INTRAVENOUS | Status: DC

## 2018-12-06 MED ORDER — SODIUM CHLORIDE 0.9 % IV SOLN
Freq: Once | INTRAVENOUS | Status: AC
  Administered 2018-12-06: 13:00:00 via INTRAVENOUS
  Filled 2018-12-06: qty 1000

## 2018-12-06 NOTE — Patient Instructions (Signed)
[place] at [facility] Discharge Instructions  You were seen today by Dr. Delton Coombes. He went over your recent lab results. He is going to send you in medication to help with the cramping. He will give you fluids today. He will see you back as scheduled for labs and follow up.   Thank you for choosing [place] at [facility] to provide your oncology and hematology care.  To afford each patient quality time with our provider, please arrive at least 15 minutes before your scheduled appointment time.   If you have a lab appointment with the [place] please come in thru the  Main Entrance and check in at the main information desk  You need to re-schedule your appointment should you arrive 10 or more minutes late.  We strive to give you quality time with our providers, and arriving late affects you and other patients whose appointments are after yours.  Also, if you no show three or more times for appointments you may be dismissed from the clinic at the providers discretion.     Again, thank you for choosing [facility].  Our hope is that these requests will decrease the amount of time that you wait before being seen by our physicians.       _____________________________________________________________  Should you have questions after your visit to [facility], please contact our office at [phone] between the hours of 8:00 a.m. and 4:30 p.m.  Voicemails left after 4:00 p.m. will not be returned until the following business day.  For prescription refill requests, have your pharmacy contact our office and allow 72 hours.    Cancer Center Support Programs:   > Cancer Support Group  2nd Tuesday of the month 1pm-2pm, Journey Room

## 2018-12-06 NOTE — Progress Notes (Signed)
Maxwell Henderson tolerated hydration with magnesium and potassium well without complaints or incident. VSS upon discharge. Pt reports he is feeling better after receiving hydration and Reglan and Levsin. Pt discharged self ambulatory in satisfactory condition

## 2018-12-06 NOTE — Progress Notes (Signed)
Maxwell Henderson, [place] [address]   CLINIC:  Medical Oncology/Hematology  PCP:  Maxwell Henderson, Maxwell Henderson [id number] [phone]   REASON FOR VISIT:  Follow-up for abdominal pain    BRIEF ONCOLOGIC HISTORY:    Primary esophageal adenocarcinoma (Maxwell Henderson)   01/16/2017 Initial Diagnosis    Primary esophageal adenocarcinoma (Maxwell Henderson)    01/16/2017 Procedure    EGD by Dr. Barney Henderson. Partially obstructing, likely malignant esophageal tumor was found in the distal esophagus. Biopsied with surgical path demonstrating Adenocarcinoma. Injected. Treated with argon plasma coagulation (APC) #3. 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 Biopsied with surgical path demonstrating Adenocarcinoma.    01/19/2017 PET scan    NECK Two FDG avid nodes are seen in the base of the neck on the right. The first is seen on CT image 35, just posterior to the right thyroid lobe and the other is seen on CT image 41 measuring 16 mm in short axis with a maximum SUV of 8.6.  CHEST  The patient's known malignancy involving the distal esophagus and cardia of the stomach is FDG avid with a maximum SUV of 10.4. No definitive metastatic nodes within the chest.  ABDOMEN/PELVIS  FDG avid metastatic adenopathy is seen in the gastrohepatic ligament, paraceliac nodes, posterior to the IVC on image 111, and to the left of the SMA on image 112. The most inferior node is seen in the aortocaval region on image 117. The representative node to the left of the SMA was measured on series 4, image 111 measuring 2.5 by 2.1 cm with a maximum SUV of 7. No other FDG avid disease in the abdomen or pelvis. Cholelithiasis is identified.    02/02/2017 Procedure    US guided bx of right supraclavicular LN by IR.    02/05/2017 Pathology Results    Lymph node, needle/core biopsy, Right Supraclavicular - METASTATIC POORLY DIFFERENTIATED CARCINOMA.    02/10/2017 Pathology Results    HER2 - NEGATIVE    02/13/2017 Procedure    Port placed by IR    02/15/2017 - 04/25/2017 Chemotherapy    FOLFOX x 6 cycles     05/02/2017 Imaging    CT C/A/P: IMPRESSION: 1. Nodular thickening and hyperenhancement in the distal esophagus and gastric cardia could reflect residual tumor. 2. No significant change in size of previously demonstrated hypermetabolic upper abdominal lymph nodes. No progressive adenopathy identified. No residual enlarged supraclavicular nodes are seen. 3. No evidence of progressive metastatic disease. No worrisome hepatic findings. 4. Tiny right upper lobe pulmonary nodule, not clearly seen on low dose previous PET-CT. Attention on follow-up recommended. 5. Cholelithiasis.    12/02/2018 -  Chemotherapy    The patient had palonosetron (ALOXI) injection 0.25 mg, 0.25 mg, Intravenous,  Once, 1 of 4 cycles Administration: 0.25 mg (12/02/2018) irinotecan (CAMPTOSAR) 380 mg in dextrose 5 % 500 mL chemo infusion, 180 mg/m2 = 380 mg, Intravenous,  Once, 1 of 4 cycles Administration: 380 mg (12/02/2018) leucovorin 864 mg in dextrose 5 % 250 mL infusion, 400 mg/m2 = 864 mg, Intravenous,  Once, 1 of 4 cycles Administration: 864 mg (12/02/2018) fluorouracil (ADRUCIL) chemo injection 850 mg, 400 mg/m2 = 850 mg, Intravenous,  Once, 1 of 4 cycles Administration: 850 mg (12/02/2018) fluorouracil (ADRUCIL) 5,000 mg in sodium chloride 0.9 % 150 mL chemo infusion, 2,320 mg/m2 = 5,200 mg, Intravenous, 1 Day/Dose, 1 of 4 cycles Administration: 5,000 mg (12/02/2018)  for chemotherapy treatment.  Henderson STAGING: Henderson Staging Primary esophageal adenocarcinoma [facility]) Staging form: Esophagus - Adenocarcinoma, AJCC 8th Edition - Clinical stage from 02/11/2017: Stage IVA (cTX, cN3, cM0) - Signed by Maxwell Cancer, PA-C on 02/11/2017    INTERVAL HISTORY:  [name] Henderson 53 y.o. male returns for abdominal pain. He states that he has not ate anything at  all this week, only drank a few drops of gatorade. Pt states that he has lost 9 pounds this week. Pt states that the abdominal pain comes and goes. Pt states that the morphine only helps th pain for about 30 minutes. Pt is unable to keep the ensure down. Pt states that he has had constipation over the past few days, had watery diarrhea yesterday. He states that his last solid food last week. Pt states that even taking the nausea medication prior to eating doesn't help. He states that he feels bloated. He states that cold or hot doesn't change the pain.  Denies any vomiting. Denies any new pains. Had not noticed any recent bleeding such as epistaxis, hematuria or hematochezia. Denies recent chest pain on exertion, shortness of breath on minimal exertion, pre-syncopal episodes, or palpitations. Denies any numbness or tingling in hands or feet. Denies any recent fevers, infections, or recent hospitalizations. Patient reports appetite at 0% and energy level at 0%.    REVIEW OF SYSTEMS:  Review of Systems  Gastrointestinal: Positive for abdominal pain and nausea.  All other systems reviewed and are negative.    PAST MEDICAL/SURGICAL HISTORY:  Past Medical History:  Diagnosis Date  . Hypertension   . 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 Primary esophageal adenocarcinoma ([place]) 01/29/2017   Past Surgical History:  Procedure Laterality Date  . ESOPHAGOGASTRODUODENOSCOPY (EGD) WITH PROPOFOL N/A 01/16/2017   Procedure: ESOPHAGOGASTRODUODENOSCOPY (EGD) WITH PROPOFOL;  Surgeon: [name], MD;  Location: [facility];  Service: Endoscopy;  Laterality: N/A;  730   . IR FLUORO GUIDE PORT INSERTION RIGHT  02/13/2017  . IR US GUIDE VASC ACCESS RIGHT  02/13/2017  . lipoma removal    . PORTA CATH INSERTION Right 02/13/2017  . SAVORY DILATION N/A 01/16/2017   Procedure: SAVORY DILATION;  Surgeon: [name], MD;  Location: [facility];  Service: Endoscopy;  Laterality: N/A;      SOCIAL HISTORY:  Social History   Socioeconomic History  . Marital status: Married    Spouse name: Not on file  . Number of children: Not on file  . Years of education: Not on file  . Highest education level: Not on file  Occupational History  . Not on file  Social Needs  . Financial resource strain: Not on file  . Food insecurity:    Worry: Not on file    Inability: Not on file  . Transportation needs:    Medical: Not on file    Non-medical: Not on file  Tobacco Use  . Smoking status: Former Smoker    Packs/day: 0.25    Types: Cigarettes  . Smokeless tobacco: Never Used  Substance and Sexual Activity  . Alcohol use: No    Comment: hx heavy alcohol, hasn't drank in 19 yrs  . Drug use: Yes    Types: Marijuana    Comment: history of marijuana   . Sexual activity: Yes  Lifestyle  . Physical activity:    Days per week: Not on file    Minutes per session: Not on file  . Stress: Not on file  Relationships  . Social connections:    Talks on phone: Not on file    Gets together: Not on file    Attends religious service: Not on file    Active member of club or organization: Not on file    Attends meetings of clubs or organizations: Not on file    Relationship status: Not on file  . Intimate partner violence:    Fear of current or ex partner: Not on file    Emotionally abused: Not on file    Physically abused: Not on file    Forced sexual activity: Not on file  Other Topics Concern  . Not on file  Social History Narrative   WORKING [employer].    FAMILY HISTORY:  Family History  Problem Relation Age of Onset  . Prostate Henderson Father   . Colon Henderson Neg Hx   . Colon polyps Neg Hx     CURRENT MEDICATIONS:  Outpatient Encounter Medications as of 12/06/2018  Medication Sig  . acetaminophen (TYLENOL) 325 MG tablet Take 2 tablets (650 mg total) by mouth every 4 (four) hours as needed for headache.  . Alpha-Lipoic Acid 600 MG CAPS Take 1 capsule  (600 mg total) by mouth daily.  Marland Kitchen amLODipine (NORVASC) 10 MG tablet TAKE 1 TABLET BY MOUTH ONCE DAILY  . B Complex-C (B-COMPLEX WITH VITAMIN C) tablet Take 1 tablet by mouth daily.  Marland Kitchen dexamethasone (DECADRON) 4 MG tablet Take 8 mg by mouth 2 (two) times daily.  Marland Kitchen gabapentin (NEURONTIN) 300 MG capsule Take 1 capsule by mouth twice daily  . Hyoscyamine Sulfate SL (LEVSIN/SL) 0.125 MG SUBL Place 1 tablet under the tongue every 4 (four) hours as needed.  . lidocaine-prilocaine (EMLA) cream Apply 1 application topically as needed.  . loratadine (CLARITIN) 10 MG tablet Take 1 tablet (10 mg total) by mouth daily.  . Misc. Devices MISC Please provide the patient with the [facility] HPB cough Syrup. 1 teaspoonful every 4-6 hours PRN. Please provide the patient with 360 ml.  . Morphine Sulfate (MORPHINE CONCENTRATE) 10 mg / 0.5 ml concentrated solution Take 0.5 mLs (10 mg total) by mouth every 4 (four) hours as needed for severe pain.  . pantoprazole (PROTONIX) 40 MG tablet Take 1 tablet (40 mg total) by mouth 2 (two) times daily. Take 30 minutes before breakfast  . prochlorperazine (COMPAZINE) 10 MG tablet TAKE 1 TABLET BY MOUTH EVERY 6 HOURS AS NEEDED FOR NAUSEA AND VOMITING  . SSD 1 % cream APPLY CREAM ONCE A DAY AS INSTRUCTED   Facility-Administered Encounter Medications as of 12/06/2018  Medication  . heparin lock flush 100 unit/mL  . heparin lock flush 100 unit/mL  . [COMPLETED] hyoscyamine (LEVSIN SL) SL tablet 0.125 mg  . [COMPLETED] metoCLOPramide (REGLAN) injection 10 mg  . sodium chloride 0.9 % 1,000 mL with potassium chloride 20 mEq, magnesium sulfate 2 g infusion  . [COMPLETED] sodium chloride flush (NS) 0.9 % injection 10 mL  . sodium chloride flush (NS) 0.9 % injection 10 mL  . [DISCONTINUED] ondansetron (ZOFRAN) 8 mg in sodium chloride 0.9 % 50 mL IVPB    ALLERGIES:  No Known Allergies   PHYSICAL EXAM:  ECOG Performance status: 1  Vitals:   12/06/18 1156  BP: 134/83  Pulse:  78  Resp: 18  Temp: 98.2 F (36.8 C)  SpO2: 100%   Filed Weights   12/06/18 1156  Weight: 191 lb 6.4 oz (86.8 kg)    Physical Exam Vitals signs  reviewed.  Constitutional:      Appearance: Normal appearance.  Cardiovascular:     Rate and Rhythm: Normal rate and regular rhythm.     Pulses: Normal pulses.     Heart sounds: Normal heart sounds.  Pulmonary:     Breath sounds: Normal breath sounds.  Abdominal:     Palpations: Abdomen is soft. There is no mass.     Tenderness: There is abdominal tenderness.  Musculoskeletal:        General: No swelling.  Skin:    General: Skin is warm.  Neurological:     General: No focal deficit present.     Mental Status: He is alert and oriented to person, place, and time.  Psychiatric:        Mood and Affect: Mood normal.        Behavior: Behavior normal.      LABORATORY DATA:  I have reviewed the labs as listed.  CBC    Component Value Date/Time   WBC 5.1 12/06/2018 1146   RBC 5.50 12/06/2018 1146   HGB 14.3 12/06/2018 1146   HCT 42.2 12/06/2018 1146   PLT 169 12/06/2018 1146   MCV 76.7 (L) 12/06/2018 1146   MCH 26.0 12/06/2018 1146   MCHC 33.9 12/06/2018 1146   RDW 14.3 12/06/2018 1146   LYMPHSABS 2.4 12/06/2018 1146   MONOABS 0.2 12/06/2018 1146   EOSABS 0.2 12/06/2018 1146   BASOSABS 0.0 12/06/2018 1146   CMP Latest Ref Rng & Units 12/06/2018 11/29/2018 11/15/2018  Glucose 70 - 99 mg/dL 110(H) 128(H) 97  BUN 6 - 20 mg/dL _0 Creatinine 0.61 - 1.24 mg/dL 0.99 0.91 0.82  Sodium 135 - 145 mmol/L 136 139 137  Potassium 3.5 - 5.1 mmol/L 3.7 3.6 3.7  Chloride 98 - 111 mmol/L 103 105 104  CO2 22 - 32 mmol/L _1 Calcium 8.9 - 10.3 mg/dL 8.7(L) 9.2 9.2  Total Protein 6.5 - 8.1 g/dL 7.5 8.0 7.7  Total Bilirubin 0.3 - 1.2 mg/dL 0.8 0.6 0.3  Alkaline Phos 38 - 126 U/L 56 54 54  AST 15 - 41 U/L _2 ALT 0 - 44 U/L _3 DIAGNOSTIC IMAGING:  I have independently reviewed the scans and discussed  with the patient.   I have reviewed Venita Lick LPN's note and agree with the documentation.  I personally performed a face-to-face visit, made revisions and my assessment and plan is as follows.    ASSESSMENT & PLAN:   Primary esophageal adenocarcinoma ([place]) 1.  Stage IV GE junction adenocarcinoma: - 6 cycles of FOLFOX from 02/15/2017 through 04/25/2017 -Paclitaxel and Cyramza from 05/18/2017, paclitaxel discontinued on 09/21/2017 secondary to neuropathy - Foundation 1 CDX showing MS-stable, TMB 3Muts/mb - CT scan on 11/28/2017 showing 4 mm right lower lobe nodule which is new, with stable disease elsewhere. - He was admitted to the hospital from 12/10/2017 through 12/12/2017 with pneumonia from parainfluenza 3. - CT scan on 03/22/2018 showed resolution of the right supraclavicular adenopathy and mediastinal adenopathy.  Residual soft tissue thickening involving the distal GE junction with no obvious mass.  Largely resolved upper abdominal adenopathy.  There is a persistent partially necrotic celiac axis lymph node which is stable.  No pulmonary metastatic disease. -CT abdomen pelvis on 08/23/2018 shows necrotic nodes with peripheral rim enhancement, largest measuring 19 mm, increased from 18 mm previously.  There is another lymph node which increased to 12  mm from 8 mm previously.  No new lesions seen.  Thickening of the GE junction is stable. -He reportedly had weight loss of 10 pounds since January.  He did have occasional episodes of regurgitation. - He was evaluated by Dr. Truddie Crumble at [facility].  EGD was done on 11/25/2018. -I talked to Dr. Truddie Crumble about the findings on the EGD.  There was clear progression of the mass.  Dr. Truddie Crumble has recommended changing therapy.  If there is no improvement he will place a stent. - Cyramza was discontinued. -First cycle of FOLFIRI started on 12/02/2018.  He has developed nausea and vomiting and received IV fluids with Zofran on 12/04/2018. -Patient is seen today  as he had cramping in the epigastric region.  He is not able to eat any solid foods.  He tried to drink Ensure yesterday but could not tolerate it.  He also had diarrhea up to 6 times yesterday after drinking prune juice for constipation. - He will receive IV hydration today.  I have done labs and reviewed them.  We will also give him some Reglan. -I will start him on hyoscyamine 0.125 sublingually every 4 hours as needed. -We will consider dose reduction of cycle 2 with addition of atropine in the premeds.  2.  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 -She has bilateral toe numbness with occasional pains.  He is taking gabapentin 3 times a day.       Total time spent is 25 minutes with more than 50% of the time spent face-to-face discussing diagnosis, management options and coordination of care.    Orders placed this encounter:  Orders Placed This Encounter  Procedures  . [place], [place] [phone]

## 2018-12-06 NOTE — Assessment & Plan Note (Signed)
1.  Stage IV GE junction adenocarcinoma: - 6 cycles of FOLFOX from 02/15/2017 through 04/25/2017 -Paclitaxel and Cyramza from 05/18/2017, paclitaxel discontinued on 09/21/2017 secondary to neuropathy - Foundation 1 CDX showing MS-stable, TMB 3Muts/mb - CT scan on 11/28/2017 showing 4 mm right lower lobe nodule which is new, with stable disease elsewhere. - He was admitted to the hospital from 12/10/2017 through 12/12/2017 with pneumonia from parainfluenza 3. - CT scan on 03/22/2018 showed resolution of the right supraclavicular adenopathy and mediastinal adenopathy.  Residual soft tissue thickening involving the distal GE junction with no obvious mass.  Largely resolved upper abdominal adenopathy.  There is a persistent partially necrotic celiac axis lymph node which is stable.  No pulmonary metastatic disease. -CT abdomen pelvis on 08/23/2018 shows necrotic nodes with peripheral rim enhancement, largest measuring 19 mm, increased from 18 mm previously.  There is another lymph node which increased to 12 mm from 8 mm previously.  No new lesions seen.  Thickening of the GE junction is stable. -He reportedly had weight loss of 10 pounds since January.  He did have occasional episodes of regurgitation. - He was evaluated by Dr. Truddie Crumble at [facility].  EGD was done on 11/25/2018. -I talked to Dr. Truddie Crumble about the findings on the EGD.  There was clear progression of the mass.  Dr. Truddie Crumble has recommended changing therapy.  If there is no improvement he will place a stent. - Cyramza was discontinued. -First cycle of FOLFIRI started on 12/02/2018.  He has developed nausea and vomiting and received IV fluids with Zofran on 12/04/2018. -Patient is seen today as he had cramping in the epigastric region.  He is not able to eat any solid foods.  He tried to drink Ensure yesterday but could not tolerate it.  He also had diarrhea up to 6 times yesterday after drinking prune juice for constipation. - He will receive IV hydration  today.  I have done labs and reviewed them.  We will also give him some Reglan. -I will start him on hyoscyamine 0.125 sublingually every 4 hours as needed. -We will consider dose reduction of cycle 2 with addition of atropine in the premeds.  2.  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 -She has bilateral toe numbness with occasional pains.  He is taking gabapentin 3 times a day.

## 2018-12-06 NOTE — Progress Notes (Signed)
12/06/18  Received call to give patient Levsin 0.125 mg Sublingual now in clinic.  T.O. Dr Rhys Martini, PharmD

## 2018-12-09 ENCOUNTER — Ambulatory Visit (HOSPITAL_COMMUNITY): Payer: Medicaid Other

## 2018-12-10 ENCOUNTER — Other Ambulatory Visit (HOSPITAL_COMMUNITY): Payer: Self-pay | Admitting: *Deleted

## 2018-12-10 ENCOUNTER — Other Ambulatory Visit (HOSPITAL_COMMUNITY): Payer: Self-pay | Admitting: Hematology

## 2018-12-10 ENCOUNTER — Ambulatory Visit (HOSPITAL_COMMUNITY): Payer: Medicaid Other | Admitting: Hematology

## 2018-12-10 MED ORDER — MORPHINE SULFATE (CONCENTRATE) 10 MG /0.5 ML PO SOLN: 10.0000 mg | ORAL | 0 refills | Status: DC | PRN

## 2018-12-13 ENCOUNTER — Other Ambulatory Visit: Payer: Self-pay

## 2018-12-13 ENCOUNTER — Ambulatory Visit (HOSPITAL_COMMUNITY)
Admission: RE | Admit: 2018-12-13 | Discharge: 2018-12-13 | Disposition: A | Discharge status: Home / Self Care | Payer: Medicaid Other | Source: Ambulatory Visit | Attending: Hematology | Admitting: Hematology

## 2018-12-13 DIAGNOSIS — C159 Malignant neoplasm of esophagus, unspecified: Secondary | ICD-10-CM | POA: Diagnosis present

## 2018-12-13 MED ORDER — IOHEXOL 300 MG/ML  SOLN
100.0000 mL | Freq: Once | INTRAMUSCULAR | Status: AC | PRN
  Administered 2018-12-13: 100 mL via INTRAVENOUS

## 2018-12-16 ENCOUNTER — Inpatient Hospital Stay (HOSPITAL_BASED_OUTPATIENT_CLINIC_OR_DEPARTMENT_OTHER): Payer: Medicaid Other | Admitting: Hematology

## 2018-12-16 ENCOUNTER — Inpatient Hospital Stay (HOSPITAL_COMMUNITY): Payer: Medicaid Other

## 2018-12-16 ENCOUNTER — Encounter (HOSPITAL_COMMUNITY): Payer: Self-pay | Admitting: Hematology

## 2018-12-16 ENCOUNTER — Inpatient Hospital Stay (HOSPITAL_COMMUNITY): Payer: Medicaid Other | Attending: Hematology

## 2018-12-16 ENCOUNTER — Other Ambulatory Visit: Payer: Self-pay

## 2018-12-16 VITALS — BP 136/86 | HR 68 | Temp 98.3°F | Resp 18

## 2018-12-16 DIAGNOSIS — C770 Secondary and unspecified malignant neoplasm of lymph nodes of head, face and neck: Secondary | ICD-10-CM

## 2018-12-16 DIAGNOSIS — R911 Solitary pulmonary nodule: Secondary | ICD-10-CM

## 2018-12-16 DIAGNOSIS — Z87891 Personal history of nicotine dependence: Secondary | ICD-10-CM

## 2018-12-16 DIAGNOSIS — G620 Drug-induced polyneuropathy: Secondary | ICD-10-CM | POA: Diagnosis not present

## 2018-12-16 DIAGNOSIS — Z5111 Encounter for antineoplastic chemotherapy: Secondary | ICD-10-CM | POA: Diagnosis present

## 2018-12-16 DIAGNOSIS — K219 Gastro-esophageal reflux disease without esophagitis: Secondary | ICD-10-CM

## 2018-12-16 DIAGNOSIS — C159 Malignant neoplasm of esophagus, unspecified: Secondary | ICD-10-CM

## 2018-12-16 DIAGNOSIS — C155 Malignant neoplasm of lower third of esophagus: Secondary | ICD-10-CM | POA: Diagnosis present

## 2018-12-16 DIAGNOSIS — R5383 Other fatigue: Secondary | ICD-10-CM

## 2018-12-16 DIAGNOSIS — C158 Malignant neoplasm of overlapping sites of esophagus: Secondary | ICD-10-CM

## 2018-12-16 DIAGNOSIS — Z452 Encounter for adjustment and management of vascular access device: Secondary | ICD-10-CM | POA: Insufficient documentation

## 2018-12-16 LAB — CBC WITH DIFFERENTIAL/PLATELET
Abs Immature Granulocytes: 0.01 10*3/uL (ref 0.00–0.07)
Basophils Absolute: 0 10*3/uL (ref 0.0–0.1)
Basophils Relative: 0 %
Eosinophils Absolute: 0.1 10*3/uL (ref 0.0–0.5)
Eosinophils Relative: 1 %
HCT: 40.2 % (ref 39.0–52.0)
Hemoglobin: 13.2 g/dL (ref 13.0–17.0)
Immature Granulocytes: 0 %
Lymphocytes Relative: 29 %
Lymphs Abs: 1.6 10*3/uL (ref 0.7–4.0)
MCH: 25.6 pg — ABNORMAL LOW (ref 26.0–34.0)
MCHC: 32.8 g/dL (ref 30.0–36.0)
MCV: 77.9 fL — ABNORMAL LOW (ref 80.0–100.0)
Monocytes Absolute: 0.3 10*3/uL (ref 0.1–1.0)
Monocytes Relative: 6 %
Neutro Abs: 3.6 10*3/uL (ref 1.7–7.7)
Neutrophils Relative %: 64 %
Platelets: 181 10*3/uL (ref 150–400)
RBC: 5.16 MIL/uL (ref 4.22–5.81)
RDW: 14.6 % (ref 11.5–15.5)
WBC: 5.6 10*3/uL (ref 4.0–10.5)
nRBC: 0 % (ref 0.0–0.2)

## 2018-12-16 LAB — COMPREHENSIVE METABOLIC PANEL
ALT: 16 U/L (ref 0–44)
AST: 19 U/L (ref 15–41)
Albumin: 4.1 g/dL (ref 3.5–5.0)
Alkaline Phosphatase: 49 U/L (ref 38–126)
Anion gap: 11 (ref 5–15)
BUN: 14 mg/dL (ref 6–20)
CO2: 25 mmol/L (ref 22–32)
Calcium: 9.2 mg/dL (ref 8.9–10.3)
Chloride: 102 mmol/L (ref 98–111)
Creatinine, Ser: 1.04 mg/dL (ref 0.61–1.24)
GFR calc Af Amer: >60 mL/min (ref >60)
GFR calc non Af Amer: >60 mL/min (ref >60)
Glucose, Bld: 134 mg/dL — ABNORMAL HIGH (ref 70–99)
Potassium: 3.8 mmol/L (ref 3.5–5.1)
Sodium: 138 mmol/L (ref 135–145)
Total Bilirubin: 0.6 mg/dL (ref 0.3–1.2)
Total Protein: 7.4 g/dL (ref 6.5–8.1)

## 2018-12-16 MED ORDER — SODIUM CHLORIDE 0.9 % IV SOLN
10.0000 mg | Freq: Once | INTRAVENOUS | Status: AC
  Administered 2018-12-16: 10 mg via INTRAVENOUS
  Filled 2018-12-16: qty 10

## 2018-12-16 MED ORDER — ATROPINE SULFATE 1 MG/ML IJ SOLN
1.0000 mg | Freq: Once | INTRAMUSCULAR | Status: AC
  Administered 2018-12-16: 11:00:00 1 mg via INTRAVENOUS
  Filled 2018-12-16: qty 1

## 2018-12-16 MED ORDER — SODIUM CHLORIDE 0.9 % IV SOLN
2320.0000 mg/m2 | INTRAVENOUS | Status: DC
  Administered 2018-12-16: 5000 mg via INTRAVENOUS
  Filled 2018-12-16: qty 100

## 2018-12-16 MED ORDER — PALONOSETRON HCL INJECTION 0.25 MG/5ML
0.2500 mg | Freq: Once | INTRAVENOUS | Status: AC
  Administered 2018-12-16: 0.25 mg via INTRAVENOUS
  Filled 2018-12-16: qty 5

## 2018-12-16 MED ORDER — SODIUM CHLORIDE 0.9 % IV SOLN
Freq: Once | INTRAVENOUS | Status: AC
  Administered 2018-12-16: 10:00:00 via INTRAVENOUS

## 2018-12-16 MED ORDER — SODIUM CHLORIDE 0.9% FLUSH
10.0000 mL | INTRAVENOUS | Status: DC | PRN
  Administered 2018-12-16: 10 mL
  Filled 2018-12-16: qty 10

## 2018-12-16 MED ORDER — IRINOTECAN HCL CHEMO INJECTION 100 MG/5ML
144.0000 mg/m2 | Freq: Once | INTRAVENOUS | Status: AC
  Administered 2018-12-16: 320 mg via INTRAVENOUS
  Filled 2018-12-16: qty 1

## 2018-12-16 MED ORDER — LEUCOVORIN CALCIUM INJECTION 350 MG
400.0000 mg/m2 | Freq: Once | INTRAVENOUS | Status: AC
  Administered 2018-12-16: 864 mg via INTRAVENOUS
  Filled 2018-12-16: qty 43.2

## 2018-12-16 MED ORDER — FLUOROURACIL CHEMO INJECTION 2.5 GM/50ML
400.0000 mg/m2 | Freq: Once | INTRAVENOUS | Status: AC
  Administered 2018-12-16: 850 mg via INTRAVENOUS
  Filled 2018-12-16: qty 17

## 2018-12-16 MED ORDER — SODIUM CHLORIDE 0.9 % IV SOLN
Freq: Once | INTRAVENOUS | Status: AC
  Administered 2018-12-16: 11:00:00 via INTRAVENOUS

## 2018-12-16 NOTE — Progress Notes (Signed)
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 Barney Drain. Partially obstructing, likely malignant esophageal tumor was found in the distal esophagus. Biopsied with surgical path demonstrating Adenocarcinoma. Injected. Treated with argon plasma coagulation (APC) #3. 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 Biopsied with surgical path demonstrating Adenocarcinoma.    01/19/2017 PET scan    NECK Two FDG avid nodes are seen in the base of the neck on the right. The first is seen on CT image 35, just posterior to the right thyroid lobe and the other is seen on CT image 41 measuring 16 mm in short axis with a maximum SUV of 8.6.  CHEST  The patient's known malignancy involving the distal esophagus and cardia of the stomach is FDG avid with a maximum SUV of 10.4. No definitive metastatic nodes within the chest.  ABDOMEN/PELVIS  FDG avid metastatic adenopathy is seen in the gastrohepatic ligament, paraceliac nodes, posterior to the IVC on image 111, and to the left of the SMA on image 112. The most inferior node is seen in the aortocaval region on image 117. The representative node to the left of the SMA was measured on series 4, image 111 measuring 2.5 by 2.1 cm with a maximum SUV of 7. No other FDG avid disease in the abdomen or pelvis. Cholelithiasis is identified.    02/02/2017 Procedure    US guided bx of right supraclavicular LN by IR.    02/05/2017 Pathology Results    Lymph node, needle/core biopsy, Right Supraclavicular - METASTATIC  POORLY DIFFERENTIATED CARCINOMA.    02/10/2017 Pathology Results    HER2 - NEGATIVE    02/13/2017 Procedure    Port placed by IR    02/15/2017 - 04/25/2017 Chemotherapy    FOLFOX x 6 cycles     05/02/2017 Imaging    CT C/A/P: IMPRESSION: 1. Nodular thickening and hyperenhancement in the distal esophagus and gastric cardia could reflect residual tumor. 2. No significant change in size of previously demonstrated hypermetabolic upper abdominal lymph nodes. No progressive adenopathy identified. No residual enlarged supraclavicular nodes are seen. 3. No evidence of progressive metastatic disease. No worrisome hepatic findings. 4. Tiny right upper lobe pulmonary nodule, not clearly seen on low dose previous PET-CT. Attention on follow-up recommended. 5. Cholelithiasis.    12/02/2018 -  Chemotherapy    The patient had palonosetron (ALOXI) injection 0.25 mg, 0.25 mg, Intravenous,  Once, 2 of 4 cycles Administration: 0.25 mg (12/02/2018) irinotecan (CAMPTOSAR) 380 mg in dextrose 5 % 500 mL chemo infusion, 180 mg/m2 = 380 mg, Intravenous,  Once, 2 of 4 cycles Dose modification: 144 mg/m2 (80 % of original dose 180 mg/m2, Cycle 2, Reason: Provider Judgment) Administration: 380 mg (12/02/2018) leucovorin 864 mg in dextrose 5 % 250 mL infusion, 400 mg/m2 = 864 mg, Intravenous,  Once, 2 of 4 cycles Administration: 864 mg (12/02/2018) fluorouracil (ADRUCIL) chemo injection 850 mg, 400 mg/m2 = 850 mg, Intravenous,  Once, 2 of 4 cycles Administration: 850 mg (12/02/2018) fluorouracil (ADRUCIL) 5,000 mg in sodium chloride 0.9 % 150 mL chemo  infusion, 2,320 mg/m2 = 5,200 mg, Intravenous, 1 Day/Dose, 2 of 4 cycles Administration: 5,000 mg (12/02/2018)  for chemotherapy treatment.       CANCER STAGING: Cancer Staging Primary esophageal adenocarcinoma [facility]) Staging form: Esophagus - Adenocarcinoma, AJCC 8th Edition - Clinical stage from 02/11/2017: Stage IVA (cTX, cN3, cM0) - Signed by Baird Cancer,  PA-C on 02/11/2017    INTERVAL HISTORY:  [name] 53 y.o. male returns for routine follow-up and consideration for next cycle of chemotherapy. He reports overall doing well. He is currently on FOLFIRI. He reports GERD, that is tolerable. States he will try over the counter Maalox. Reports mild to moderate fatigue. Appetite is stable. No abdominal pain. No CP, SOB, lightheadedness or dizziness. Neuropathy is controlled with Gabapentin. Overall, he feels ready for next cycle of chemo today.     REVIEW OF SYSTEMS:  Review of Systems  Constitutional: Positive for fatigue.  HENT:  Negative.   Eyes: Negative.   Respiratory: Negative.   Cardiovascular: Negative.   Gastrointestinal: Negative.   Endocrine: Negative.   Genitourinary: Negative.    Musculoskeletal: Negative.   Neurological: Negative.   Hematological: Negative.   Psychiatric/Behavioral: Negative.      PAST MEDICAL/SURGICAL HISTORY:  Past Medical History:  Diagnosis Date  . Hypertension   . 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 ESOPHAGOGASTRODUODENOSCOPY (EGD) WITH PROPOFOL N/A 01/16/2017   Procedure: ESOPHAGOGASTRODUODENOSCOPY (EGD) WITH PROPOFOL;  Surgeon: [name], MD;  Location: [facility];  Service: Endoscopy;  Laterality: N/A;  730   . IR FLUORO GUIDE PORT INSERTION RIGHT  02/13/2017  . IR US GUIDE VASC ACCESS RIGHT  02/13/2017  . lipoma removal    . PORTA CATH INSERTION Right 02/13/2017  . SAVORY DILATION N/A 01/16/2017   Procedure: SAVORY DILATION;  Surgeon: [name], MD;  Location: [facility] ENDO SU[facility];  Service: Endoscopy;  Laterality: N/A;     SOCIAL HISTORY:  Social History   Socioeconomic History  . Marital status: Married    Spouse name: Not on file  . Number of children: Not on file  . Years of education: Not on file  . Highest education level: Not on file   Occupational History  . Not on file  Social Needs  . Financial resource strain: Not on file  . Food insecurity:    Worry: Not on file    Inability: Not on file  . Transportation needs:    Medical: Not on file    Non-medical: Not on file  Tobacco Use  . Smoking status: Former Smoker    Packs/day: 0.25    Types: Cigarettes  . Smokeless tobacco: Never Used  Substance and Sexual Activity  . Alcohol use: No    Comment: hx heavy alcohol, hasn't drank in 19 yrs  . Drug use: Yes    Types: Marijuana    Comment: history of marijuana   . Sexual activity: Yes  Lifestyle  . Physical activity:    Days per week: Not on file    Minutes per session: Not on file  . Stress: Not on file  Relationships  . Social connections:    Talks on phone: Not on file    Gets together: Not on file    Attends religious service: Not on file    Active member of club or organization: Not on file    Attends meetings of  clubs or organizations: Not on file    Relationship status: Not on file  . Intimate partner violence:    Fear of current or ex partner: Not on file    Emotionally abused: Not on file    Physically abused: Not on file    Forced sexual activity: Not on file  Other Topics Concern  . Not on file  Social History Narrative   WORKING [employer].    FAMILY HISTORY:  Family History  Problem Relation Age of Onset  . Prostate cancer Father   . Colon cancer Neg Hx   . Colon polyps Neg Hx     CURRENT MEDICATIONS:  Outpatient Encounter Medications as of 12/16/2018  Medication Sig  . acetaminophen (TYLENOL) 325 MG tablet Take 2 tablets (650 mg total) by mouth every 4 (four) hours as needed for headache.  . Alpha-Lipoic Acid 600 MG CAPS Take 1 capsule (600 mg total) by mouth daily.  Marland Kitchen amLODipine (NORVASC) 10 MG tablet TAKE 1 TABLET BY MOUTH ONCE DAILY  . B Complex-C (B-COMPLEX WITH VITAMIN C) tablet Take 1 tablet by mouth daily.  Marland Kitchen dexamethasone (DECADRON) 4 MG tablet Take 8 mg  by mouth 2 (two) times daily.  Marland Kitchen gabapentin (NEURONTIN) 300 MG capsule Take 1 capsule by mouth twice daily  . Hyoscyamine Sulfate SL (LEVSIN/SL) 0.125 MG SUBL Place 1 tablet under the tongue every 4 (four) hours as needed.  . lidocaine-prilocaine (EMLA) cream Apply 1 application topically as needed.  . loratadine (CLARITIN) 10 MG tablet Take 1 tablet (10 mg total) by mouth daily.  . Misc. Devices MISC Please provide the patient with the [facility]othe[facility] HPB cough Syrup. 1 teaspoonful every 4-6 hours PRN. Please provide the patient with 360 ml.  . Morphine Sulfate (MORPHINE CONCENTRATE) 10 mg / 0.5 ml concentrated solution Take 0.5 mLs (10 mg total) by mouth every 4 (four) hours as needed for severe pain.  . pantoprazole (PROTONIX) 40 MG tablet Take 1 tablet (40 mg total) by mouth 2 (two) times daily. Take 30 minutes before breakfast  . prochlorperazine (COMPAZINE) 10 MG tablet TAKE 1 TABLET BY MOUTH EVERY 6 HOURS AS NEEDED FOR NAUSEA AND VOMITING  . SSD 1 % cream APPLY CREAM ONCE A DAY AS INSTRUCTED   No facility-administered encounter medications on file as of 12/16/2018.     ALLERGIES:  No Known Allergies   PHYSICAL EXAM:  ECOG Performance status: 1  Vitals:   12/16/18 0828  BP: (!) 142/83  Pulse: 86  Resp: 20  Temp: 98 F (36.7 C)  SpO2: 100%   Filed Weights   12/16/18 0828  Weight: 195 lb 9.6 oz (88.7 kg)    Physical Exam Vitals signs reviewed.  Constitutional:      Appearance: Normal appearance.  Cardiovascular:     Rate and Rhythm: Normal rate and regular rhythm.     Heart sounds: Normal heart sounds.  Pulmonary:     Effort: Pulmonary effort is normal.     Breath sounds: Normal breath sounds.  Abdominal:     General: There is no distension.     Palpations: Abdomen is soft. There is no mass.  Musculoskeletal:        General: No swelling.  Skin:    General: Skin is warm.  Neurological:     General: No focal deficit present.     Mental Status: He is alert and oriented  to person, place, and time.  Psychiatric:  Mood and Affect: Mood normal.      LABORATORY DATA:  I have reviewed the labs as listed.  CBC    Component Value Date/Time   WBC 5.6 12/16/2018 0743   RBC 5.16 12/16/2018 0743   HGB 13.2 12/16/2018 0743   HCT 40.2 12/16/2018 0743   PLT 181 12/16/2018 0743   MCV 77.9 (L) 12/16/2018 0743   MCH 25.6 (L) 12/16/2018 0743   MCHC 32.8 12/16/2018 0743   RDW 14.6 12/16/2018 0743   LYMPHSABS 1.6 12/16/2018 0743   MONOABS 0.3 12/16/2018 0743   EOSABS 0.1 12/16/2018 0743   BASOSABS 0.0 12/16/2018 0743   CMP Latest Ref Rng & Units 12/16/2018 12/06/2018 11/29/2018  Glucose 70 - 99 mg/dL 134(H) 110(H) 128(H)  BUN 6 - 20 mg/dL '14 13 13  ' Creatinine 0.61 - 1.24 mg/dL 1.04 0.99 0.91  Sodium 135 - 145 mmol/L 138 136 139  Potassium 3.5 - 5.1 mmol/L 3.8 3.7 3.6  Chloride 98 - 111 mmol/L 102 103 105  CO2 22 - 32 mmol/L '25 25 25  ' Calcium 8.9 - 10.3 mg/dL 9.2 8.7(L) 9.2  Total Protein 6.5 - 8.1 g/dL 7.4 7.5 8.0  Total Bilirubin 0.3 - 1.2 mg/dL 0.6 0.8 0.6  Alkaline Phos 38 - 126 U/L 49 56 54  AST 15 - 41 U/L '19 31 19  ' ALT 0 - 44 U/L '16 26 16       ' DIAGNOSTIC IMAGING:  I have independently reviewed the scans and discussed with the patient.   I have reviewed Harriet Pho, NP's note and agree with the documentation.  I personally performed a face-to-face visit, made revisions and my assessment and plan is as follows.    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  Stage IV GE junction adenocarcinoma: - 6 cycles of FOLFOX from 02/15/2017 through 04/25/2017 -Paclitaxel and Cyramza from 05/18/2017, paclitaxel discontinued on 09/21/2017 secondary to neuropathy - Foundation 1 CDX showing MS-stable, TMB 3Muts/mb - CT scan on 11/28/2017 showing 4 mm right lower lobe nodule which is new, with stable disease elsewhere. - He was admitted to the hospital from 12/10/2017 through 12/12/2017 with pneumonia from parainfluenza 3. - CT scan on  03/22/2018 showed resolution of the right supraclavicular adenopathy and mediastinal adenopathy.  Residual soft tissue thickening involving the distal GE junction with no obvious mass.  Largely resolved upper abdominal adenopathy.  There is a persistent partially necrotic celiac axis lymph node which is stable.  No pulmonary metastatic disease. -CT abdomen pelvis on 08/23/2018 shows necrotic nodes with peripheral rim enhancement, largest measuring 19 mm, increased from 18 mm previously.  There is another lymph node which increased to 12 mm from 8 mm previously.  No new lesions seen.  Thickening of the GE junction is stable. -He reportedly had weight loss of 10 pounds since January.  He did have occasional episodes of regurgitation. - He was evaluated by Dr. Truddie Crumble at [facility].  EGD was done on 11/25/2018. -I talked to Dr. Truddie Crumble about the findings on the EGD.  There was clear progression of the mass.  Dr. Truddie Crumble has recommended changing therapy.  If there is no improvement he will place a stent. - Cyramza was discontinued. -First cycle of FOLFIRI started on 12/02/2018.  He has developed nausea and vomiting and received IV fluids with Zofran on 12/04/2018. - He was evaluated by me again on 12/06/2018 for abdominal cramping and decreased p.o. intake.  We started him on hyoscyamine.  He is also  taking morphine liquid 0.5 mL as needed for pain. -He did not require any pain medication in the last 3 days.  His swallowing ability has also improved. - I have reviewed his blood work.  They are adequate to proceed with cycle 2.  However because of the side effects, I plan to cut back on the dose of Irinotecan by 20%.  We will also start him on atropine as premedication.  He will use hyoscyamine at home.  He will use morphine for pain.  He was told to take Maalox for acid reflux. - He will reschedule his EGD at [facility] for the next 3 to 4 weeks.  2.  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 -He has bilateral toe numbness with occasional pains.  He is not taking gabapentin at this time.       Total time spent is 25 minutes with more than 50% of the time spent face-to-face discussing treatment plan, side effect management and coordination of care.    Orders placed this encounter:  No orders of the defined types were placed in this encounter.     Derek Jack, MD [place] [phone]

## 2018-12-16 NOTE — Progress Notes (Signed)
0915 Lab results reviewed with and pt seen by Dr. Delton Coombes and pt approved for chemo tx to day with dose reduction per MD                                                      Maxwell Henderson tolerated chemo tx well without complaints or incident. Pt discharged with 5FU pump infusing without issues. VSS upon discharge. Pt discharged self ambulatory in satisfactory condition

## 2018-12-16 NOTE — Patient Instructions (Signed)
[facility] Discharge Instructions for Patients Receiving Chemotherapy   Beginning January 23rd 2017 lab work for the [facility] will be done in the  Main lab at [facility] on 1st floor. If you have a lab appointment with the [facility] please come in thru the  Main Entrance and check in at the main information desk   Today you received the following chemotherapy agents Oxaliplatin,Leucovorin and 5FU. Follw-up as scheduled. Call clinic for any questions or concerns  To help prevent nausea and vomiting after your treatment, we encourage you to take your nausea medication   If you develop nausea and vomiting, or diarrhea that is not controlled by your medication, call the clinic.  The clinic phone number is [phone]. Office hours are Monday-Friday 8:30am-5:00pm.  BELOW ARE SYMPTOMS THAT SHOULD BE REPORTED IMMEDIATELY:  *FEVER GREATER THAN 101.0 F  *CHILLS WITH OR WITHOUT FEVER  NAUSEA AND VOMITING THAT IS NOT CONTROLLED WITH YOUR NAUSEA MEDICATION  *UNUSUAL SHORTNESS OF BREATH  *UNUSUAL BRUISING OR BLEEDING  TENDERNESS IN MOUTH AND THROAT WITH OR WITHOUT PRESENCE OF ULCERS  *URINARY PROBLEMS  *BOWEL PROBLEMS  UNUSUAL RASH Items with * indicate a potential emergency and should be followed up as soon as possible. If you have an emergency after office hours please contact your primary care physician or go to the nearest emergency department.  Please call the clinic during office hours if you have any questions or concerns.   You may also contact the Patient Navigator at [phone] should you have any questions or need assistance in obtaining follow up care.      Resources For Cancer Patients and their Caregivers ? American Cancer Society: Can assist with transportation, wigs, general needs, runs Look Good Feel Better.        7654314293 ? Cancer Care: Provides financial assistance, online support groups, medication/co-pay assistance.   1-800-813-HOPE 248-150-1344) ? [employer] Assists [place] cancer patients and their families through emotional , educational and financial support.  4700778357 ? [place] DSS Where to apply for food stamps, Medicaid and utility assistance. 716-519-9640 ? [facility]: Transportation to medical appointments. (920)711-2477 ? Social Security Administration: May apply for disability if have a Stage IV cancer. 480-420-2905 (779) 180-8516 ? [employer], Disability and Transit Services: Assists with nutrition, care and transit needs. ([phone]

## 2018-12-16 NOTE — Assessment & Plan Note (Addendum)
1.  Stage IV GE junction adenocarcinoma: - 6 cycles of FOLFOX from 02/15/2017 through 04/25/2017 -Paclitaxel and Cyramza from 05/18/2017, paclitaxel discontinued on 09/21/2017 secondary to neuropathy - Foundation 1 CDX showing MS-stable, TMB 3Muts/mb - CT scan on 11/28/2017 showing 4 mm right lower lobe nodule which is new, with stable disease elsewhere. - He was admitted to the hospital from 12/10/2017 through 12/12/2017 with pneumonia from parainfluenza 3. - CT scan on 03/22/2018 showed resolution of the right supraclavicular adenopathy and mediastinal adenopathy.  Residual soft tissue thickening involving the distal GE junction with no obvious mass.  Largely resolved upper abdominal adenopathy.  There is a persistent partially necrotic celiac axis lymph node which is stable.  No pulmonary metastatic disease. -CT abdomen pelvis on 08/23/2018 shows necrotic nodes with peripheral rim enhancement, largest measuring 19 mm, increased from 18 mm previously.  There is another lymph node which increased to 12 mm from 8 mm previously.  No new lesions seen.  Thickening of the GE junction is stable. -He reportedly had weight loss of 10 pounds since January.  He did have occasional episodes of regurgitation. - He was evaluated by Dr. Clayton at [facility].  EGD was done on 11/25/2018. -I talked to Dr. Clayton about the findings on the EGD.  There was clear progression of the mass.  Dr. Clayton has recommended changing therapy.  If there is no improvement he will place a stent. - Cyramza was discontinued. -First cycle of FOLFIRI started on 12/02/2018.  He has developed nausea and vomiting and received IV fluids with Zofran on 12/04/2018. - He was evaluated by me again on 12/06/2018 for abdominal cramping and decreased p.o. intake.  We started him on hyoscyamine.  He is also taking morphine liquid 0.5 mL as needed for pain. -He did not require any pain medication in the last 3 days.  His swallowing ability has also improved. - I  have reviewed his blood work.  They are adequate to proceed with cycle 2.  However because of the side effects, I plan to cut back on the dose of Irinotecan by 20%.  We will also start him on atropine as premedication.  He will use hyoscyamine at home.  He will use morphine for pain.  He was told to take Maalox for acid reflux. - He will reschedule his EGD at [facility] for the next 3 to 4 weeks.  2.  Peripheral neuropathy: -This is from paclitaxel which was discontinued on 09/21/2017. -He has bilateral toe numbness with occasional pains.  He is not taking gabapentin at this time.      

## 2018-12-18 ENCOUNTER — Other Ambulatory Visit (HOSPITAL_COMMUNITY): Payer: Self-pay | Admitting: Nurse Practitioner

## 2018-12-18 ENCOUNTER — Inpatient Hospital Stay (HOSPITAL_COMMUNITY): Payer: Medicaid Other

## 2018-12-18 ENCOUNTER — Other Ambulatory Visit: Payer: Self-pay

## 2018-12-18 ENCOUNTER — Encounter (HOSPITAL_COMMUNITY): Payer: Self-pay

## 2018-12-18 VITALS — BP 141/83 | HR 77 | Temp 98.6°F | Resp 16

## 2018-12-18 DIAGNOSIS — C159 Malignant neoplasm of esophagus, unspecified: Secondary | ICD-10-CM

## 2018-12-18 DIAGNOSIS — Z5111 Encounter for antineoplastic chemotherapy: Secondary | ICD-10-CM | POA: Diagnosis not present

## 2018-12-18 MED ORDER — HEPARIN SOD (PORK) LOCK FLUSH 100 UNIT/ML IV SOLN
500.0000 [IU] | Freq: Once | INTRAVENOUS | Status: AC | PRN
  Administered 2018-12-18: 500 [IU]

## 2018-12-18 MED ORDER — SODIUM CHLORIDE 0.9% FLUSH
10.0000 mL | INTRAVENOUS | Status: DC | PRN
  Administered 2018-12-18: 10 mL
  Filled 2018-12-18: qty 10

## 2018-12-18 NOTE — Progress Notes (Signed)
Maxwell Henderson returns today for port de access and flush after 46 hr continous infusion of 63fu. Tolerated infusion without problems. Portacath located chest wall was  deaccessed and flushed with 31ml NS and 500U/61ml Heparin and needle removed intact.  Procedure without incident. Patient tolerated procedure well.

## 2018-12-18 NOTE — Patient Instructions (Signed)
[facility]  Discharge Instructions:   _______________________________________________________________  Thank you for choosing [facility] at [facility] to provide your oncology and hematology care.  To afford each patient quality time with our providers, please arrive at least 15 minutes before your scheduled appointment.  You need to re-schedule your appointment if you arrive 10 or more minutes late.  We strive to give you quality time with our providers, and arriving late affects you and other patients whose appointments are after yours.  Also, if you no show three or more times for appointments you may be dismissed from the clinic.  Again, thank you for choosing [facility]. Our hope is that these requests will allow you access to exceptional care and in a timely manner. _______________________________________________________________  If you have questions after your visit, please contact our office at [phone] between the hours of 8:30 a.m. and 5:00 p.m. Voicemails left after 4:30 p.m. will not be returned until the following business day. _______________________________________________________________  For prescription refill requests, have your pharmacy contact our office. _______________________________________________________________  Recommendations made by the consultant and any test results will be sent to your referring physician. _______________________________________________________________ 

## 2018-12-20 ENCOUNTER — Ambulatory Visit (HOSPITAL_COMMUNITY): Payer: Medicaid Other

## 2018-12-20 NOTE — Progress Notes (Signed)
Nutrition  Called patient at 10:18am for nutrition follow-up.  Left message on voice mail with call back number.   Maxwell Henderson, Maxwell Henderson, Maxwell Henderson Registered Dietitian [phone] (pager)

## 2018-12-24 ENCOUNTER — Other Ambulatory Visit (HOSPITAL_COMMUNITY): Payer: Self-pay | Admitting: Nurse Practitioner

## 2018-12-24 DIAGNOSIS — C159 Malignant neoplasm of esophagus, unspecified: Secondary | ICD-10-CM

## 2018-12-30 ENCOUNTER — Inpatient Hospital Stay (HOSPITAL_COMMUNITY): Payer: Medicaid Other

## 2018-12-30 ENCOUNTER — Inpatient Hospital Stay (HOSPITAL_BASED_OUTPATIENT_CLINIC_OR_DEPARTMENT_OTHER): Payer: Medicaid Other | Admitting: Hematology

## 2018-12-30 ENCOUNTER — Encounter (HOSPITAL_COMMUNITY): Payer: Self-pay | Admitting: Hematology

## 2018-12-30 ENCOUNTER — Other Ambulatory Visit: Payer: Self-pay

## 2018-12-30 VITALS — BP 103/79 | HR 55 | Temp 97.8°F | Resp 18

## 2018-12-30 DIAGNOSIS — G620 Drug-induced polyneuropathy: Secondary | ICD-10-CM

## 2018-12-30 DIAGNOSIS — Z5111 Encounter for antineoplastic chemotherapy: Secondary | ICD-10-CM | POA: Diagnosis not present

## 2018-12-30 DIAGNOSIS — Z87891 Personal history of nicotine dependence: Secondary | ICD-10-CM

## 2018-12-30 DIAGNOSIS — C770 Secondary and unspecified malignant neoplasm of lymph nodes of head, face and neck: Secondary | ICD-10-CM

## 2018-12-30 DIAGNOSIS — C155 Malignant neoplasm of lower third of esophagus: Secondary | ICD-10-CM

## 2018-12-30 DIAGNOSIS — C158 Malignant neoplasm of overlapping sites of esophagus: Secondary | ICD-10-CM

## 2018-12-30 DIAGNOSIS — C159 Malignant neoplasm of esophagus, unspecified: Secondary | ICD-10-CM

## 2018-12-30 DIAGNOSIS — R911 Solitary pulmonary nodule: Secondary | ICD-10-CM | POA: Diagnosis not present

## 2018-12-30 DIAGNOSIS — R109 Unspecified abdominal pain: Secondary | ICD-10-CM

## 2018-12-30 LAB — CBC WITH DIFFERENTIAL/PLATELET
Abs Immature Granulocytes: 0.01 10*3/uL (ref 0.00–0.07)
Basophils Absolute: 0 10*3/uL (ref 0.0–0.1)
Basophils Relative: 0 %
Eosinophils Absolute: 0.1 10*3/uL (ref 0.0–0.5)
Eosinophils Relative: 2 %
HCT: 38.4 % — ABNORMAL LOW (ref 39.0–52.0)
Hemoglobin: 13 g/dL (ref 13.0–17.0)
Immature Granulocytes: 0 %
Lymphocytes Relative: 42 %
Lymphs Abs: 2.5 10*3/uL (ref 0.7–4.0)
MCH: 26.1 pg (ref 26.0–34.0)
MCHC: 33.9 g/dL (ref 30.0–36.0)
MCV: 77 fL — ABNORMAL LOW (ref 80.0–100.0)
Monocytes Absolute: 0.5 10*3/uL (ref 0.1–1.0)
Monocytes Relative: 9 %
Neutro Abs: 2.8 10*3/uL (ref 1.7–7.7)
Neutrophils Relative %: 47 %
Platelets: 175 10*3/uL (ref 150–400)
RBC: 4.99 MIL/uL (ref 4.22–5.81)
RDW: 15.9 % — ABNORMAL HIGH (ref 11.5–15.5)
WBC: 5.9 10*3/uL (ref 4.0–10.5)
nRBC: 0 % (ref 0.0–0.2)

## 2018-12-30 LAB — COMPREHENSIVE METABOLIC PANEL
ALT: 14 U/L (ref 0–44)
AST: 13 U/L — ABNORMAL LOW (ref 15–41)
Albumin: 4.2 g/dL (ref 3.5–5.0)
Alkaline Phosphatase: 55 U/L (ref 38–126)
Anion gap: 10 (ref 5–15)
BUN: 11 mg/dL (ref 6–20)
CO2: 26 mmol/L (ref 22–32)
Calcium: 9.2 mg/dL (ref 8.9–10.3)
Chloride: 102 mmol/L (ref 98–111)
Creatinine, Ser: 0.92 mg/dL (ref 0.61–1.24)
GFR calc Af Amer: >60 mL/min (ref >60)
GFR calc non Af Amer: >60 mL/min (ref >60)
Glucose, Bld: 117 mg/dL — ABNORMAL HIGH (ref 70–99)
Potassium: 3.8 mmol/L (ref 3.5–5.1)
Sodium: 138 mmol/L (ref 135–145)
Total Bilirubin: 0.8 mg/dL (ref 0.3–1.2)
Total Protein: 7.5 g/dL (ref 6.5–8.1)

## 2018-12-30 MED ORDER — SODIUM CHLORIDE 0.9 % IV SOLN
Freq: Once | INTRAVENOUS | Status: AC
  Administered 2018-12-30: 09:00:00 via INTRAVENOUS

## 2018-12-30 MED ORDER — PALONOSETRON HCL INJECTION 0.25 MG/5ML
0.2500 mg | Freq: Once | INTRAVENOUS | Status: AC
  Administered 2018-12-30: 0.25 mg via INTRAVENOUS
  Filled 2018-12-30: qty 5

## 2018-12-30 MED ORDER — SODIUM CHLORIDE 0.9 % IV SOLN
2320.0000 mg/m2 | INTRAVENOUS | Status: DC
  Administered 2018-12-30: 5000 mg via INTRAVENOUS
  Filled 2018-12-30: qty 100

## 2018-12-30 MED ORDER — SODIUM CHLORIDE 0.9% FLUSH
10.0000 mL | INTRAVENOUS | Status: DC | PRN
  Administered 2018-12-30: 10 mL
  Filled 2018-12-30: qty 10

## 2018-12-30 MED ORDER — SODIUM CHLORIDE 0.9 % IV SOLN
10.0000 mg | Freq: Once | INTRAVENOUS | Status: AC
  Administered 2018-12-30: 10 mg via INTRAVENOUS
  Filled 2018-12-30: qty 10

## 2018-12-30 MED ORDER — FLUOROURACIL CHEMO INJECTION 2.5 GM/50ML
400.0000 mg/m2 | Freq: Once | INTRAVENOUS | Status: AC
  Administered 2018-12-30: 12:00:00 850 mg via INTRAVENOUS
  Filled 2018-12-30: qty 17

## 2018-12-30 MED ORDER — LEUCOVORIN CALCIUM INJECTION 350 MG
400.0000 mg/m2 | Freq: Once | INTRAVENOUS | Status: AC
  Administered 2018-12-30: 864 mg via INTRAVENOUS
  Filled 2018-12-30: qty 43.2

## 2018-12-30 MED ORDER — IRINOTECAN HCL CHEMO INJECTION 100 MG/5ML
144.0000 mg/m2 | Freq: Once | INTRAVENOUS | Status: AC
  Administered 2018-12-30: 320 mg via INTRAVENOUS
  Filled 2018-12-30: qty 15

## 2018-12-30 MED ORDER — ATROPINE SULFATE 1 MG/ML IJ SOLN
1.0000 mg | Freq: Once | INTRAMUSCULAR | Status: AC
  Administered 2018-12-30: 1 mg via INTRAVENOUS
  Filled 2018-12-30: qty 1

## 2018-12-30 NOTE — Progress Notes (Signed)
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 Barney Drain. Partially obstructing, likely malignant esophageal tumor was found in the distal esophagus. Biopsied with surgical path demonstrating Adenocarcinoma. Injected. Treated with argon plasma coagulation (APC) #3. 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 Biopsied with surgical path demonstrating Adenocarcinoma.    01/19/2017 PET scan    NECK Two FDG avid nodes are seen in the base of the neck on the right. The first is seen on CT image 35, just posterior to the right thyroid lobe and the other is seen on CT image 41 measuring 16 mm in short axis with a maximum SUV of 8.6.  CHEST  The patient's known malignancy involving the distal esophagus and cardia of the stomach is FDG avid with a maximum SUV of 10.4. No definitive metastatic nodes within the chest.  ABDOMEN/PELVIS  FDG avid metastatic adenopathy is seen in the gastrohepatic ligament, paraceliac nodes, posterior to the IVC on image 111, and to the left of the SMA on image 112. The most inferior node is seen in the aortocaval region on image 117. The representative node to the left of the SMA was measured on series 4, image 111 measuring 2.5 by 2.1 cm with a maximum SUV of 7. No other FDG avid disease in the abdomen or pelvis. Cholelithiasis is identified.    02/02/2017 Procedure    US guided bx of right supraclavicular LN by IR.    02/05/2017 Pathology Results    Lymph node, needle/core biopsy, Right Supraclavicular - METASTATIC  POORLY DIFFERENTIATED CARCINOMA.    02/10/2017 Pathology Results    HER2 - NEGATIVE    02/13/2017 Procedure    Port placed by IR    02/15/2017 - 04/25/2017 Chemotherapy    FOLFOX x 6 cycles     05/02/2017 Imaging    CT C/A/P: IMPRESSION: 1. Nodular thickening and hyperenhancement in the distal esophagus and gastric cardia could reflect residual tumor. 2. No significant change in size of previously demonstrated hypermetabolic upper abdominal lymph nodes. No progressive adenopathy identified. No residual enlarged supraclavicular nodes are seen. 3. No evidence of progressive metastatic disease. No worrisome hepatic findings. 4. Tiny right upper lobe pulmonary nodule, not clearly seen on low dose previous PET-CT. Attention on follow-up recommended. 5. Cholelithiasis.    12/02/2018 -  Chemotherapy    The patient had palonosetron (ALOXI) injection 0.25 mg, 0.25 mg, Intravenous,  Once, 3 of 6 cycles Administration: 0.25 mg (12/02/2018), 0.25 mg (12/16/2018), 0.25 mg (12/30/2018) irinotecan (CAMPTOSAR) 380 mg in dextrose 5 % 500 mL chemo infusion, 180 mg/m2 = 380 mg, Intravenous,  Once, 3 of 6 cycles Dose modification: 144 mg/m2 (80 % of original dose 180 mg/m2, Cycle 2, Reason: Provider Judgment) Administration: 380 mg (12/02/2018), 320 mg (12/16/2018), 320 mg (12/30/2018) leucovorin 864 mg in dextrose 5 % 250 mL infusion, 400 mg/m2 = 864 mg, Intravenous,  Once, 3 of 6 cycles Administration: 864 mg (12/02/2018), 864 mg (12/16/2018), 864 mg (12/30/2018) fluorouracil (ADRUCIL) chemo injection 850 mg, 400 mg/m2 = 850 mg, Intravenous,  Once, 3 of  6 cycles Administration: 850 mg (12/02/2018), 850 mg (12/16/2018) fluorouracil (ADRUCIL) 5,000 mg in sodium chloride 0.9 % 150 mL chemo infusion, 2,320 mg/m2 = 5,200 mg, Intravenous, 1 Day/Dose, 3 of 6 cycles Administration: 5,000 mg (12/02/2018), 5,000 mg (12/16/2018)  for chemotherapy treatment.       CANCER STAGING: Cancer Staging Primary esophageal  adenocarcinoma [facility]) Staging form: Esophagus - Adenocarcinoma, AJCC 8th Edition - Clinical stage from 02/11/2017: Stage IVA (cTX, cN3, cM0) - Signed by Baird Cancer, PA-C on 02/11/2017    INTERVAL HISTORY:  Maxwell Henderson 53 y.o. male returns for cycle 3 of chemotherapy.  He received cycle 2 of FOLFIRI on 12/16/2018.  We have dose reduced his chemotherapy at that time.  He did tolerate cycle 2 very well.  He does get abdominal pains at nighttime.  He takes hyoscyamine and morphine liquid which controls the pain.  Denies any nausea or regurgitation.  Most solid foods other than spaghetti get stuck behind his sternal area.  He is also drinking 4 to 5 cans of boost per day.  Neuropathy in the feet has been stable.  Denies any diarrhea.  Appetite is 25% and energy levels are 75%.   REVIEW OF SYSTEMS:  Review of Systems  Constitutional: Negative for fatigue.  HENT:  Negative.   Eyes: Negative.   Respiratory: Negative.   Cardiovascular: Negative.   Gastrointestinal: Positive for abdominal pain.  Endocrine: Negative.   Genitourinary: Negative.    Musculoskeletal: Negative.   Neurological: Positive for numbness.  Hematological: Negative.   Psychiatric/Behavioral: Negative.   All other systems reviewed and are negative.    PAST MEDICAL/SURGICAL HISTORY:  Past Medical History:  Diagnosis Date  . Hypertension   . 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 ESOPHAGOGASTRODUODENOSCOPY (EGD) WITH PROPOFOL N/A 01/16/2017   Procedure: ESOPHAGOGASTRODUODENOSCOPY (EGD) WITH PROPOFOL;  Surgeon: [name], MD;  Location: [facility];  Service: Endoscopy;  Laterality: N/A;  730   . IR FLUORO GUIDE PORT INSERTION RIGHT  02/13/2017  . IR US GUIDE VASC ACCESS RIGHT  02/13/2017  . lipoma removal    . PORTA CATH INSERTION Right 02/13/2017  . SAVORY DILATION N/A 01/16/2017    Procedure: SAVORY DILATION;  Surgeon: [name], MD;  Location: [facility] END[facility] SU[facility];  Service: Endoscopy;  Laterality: N/A;     SOCIAL HISTORY:  Social History   Socioeconomic History  . Marital status: Married    Spouse name: Not on file  . Number of children: Not on file  . Years of education: Not on file  . Highest education level: Not on file  Occupational History  . Not on file  Social Needs  . Financial resource strain: Not on file  . Food insecurity:    Worry: Not on file    Inability: Not on file  . Transportation needs:    Medical: Not on file    Non-medical: Not on file  Tobacco Use  . Smoking status: Former Smoker    Packs/day: 0.25    Types: Cigarettes  . Smokeless tobacco: Never Used  Substance and Sexual Activity  . Alcohol use: No    Comment: hx heavy alcohol, hasn't drank in 19 yrs  . Drug use: Yes    Types: Marijuana    Comment: history of marijuana   . Sexual activity: Yes  Lifestyle  . Physical activity:    Days  per week: Not on file    Minutes per session: Not on file  . Stress: Not on file  Relationships  . Social connections:    Talks on phone: Not on file    Gets together: Not on file    Attends religious service: Not on file    Active member of club or organization: Not on file    Attends meetings of clubs or organizations: Not on file    Relationship status: Not on file  . Intimate partner violence:    Fear of current or ex partner: Not on file    Emotionally abused: Not on file    Physically abused: Not on file    Forced sexual activity: Not on file  Other Topics Concern  . Not on file  Social History Narrative   WORKING [employer].    FAMILY HISTORY:  Family History  Problem Relation Age of Onset  . Prostate cancer Father   . Colon cancer Neg Hx   . Colon polyps Neg Hx     CURRENT MEDICATIONS:  Outpatient Encounter Medications as of 12/30/2018  Medication Sig  . acetaminophen (TYLENOL) 325 MG tablet  Take 2 tablets (650 mg total) by mouth every 4 (four) hours as needed for headache.  . Alpha-Lipoic Acid 600 MG CAPS Take 1 capsule (600 mg total) by mouth daily.  Marland Kitchen amLODipine (NORVASC) 10 MG tablet Take 1 tablet by mouth once daily  . B Complex-C (B-COMPLEX WITH VITAMIN C) tablet Take 1 tablet by mouth daily.  Marland Kitchen dexamethasone (DECADRON) 4 MG tablet Take 8 mg by mouth 2 (two) times daily.  Marland Kitchen gabapentin (NEURONTIN) 300 MG capsule Take 1 capsule by mouth twice daily  . Hyoscyamine Sulfate SL (LEVSIN/SL) 0.125 MG SUBL Place 1 tablet under the tongue every 4 (four) hours as needed.  . lidocaine-prilocaine (EMLA) cream Apply 1 application topically as needed.  . loratadine (CLARITIN) 10 MG tablet Take 1 tablet (10 mg total) by mouth daily.  . Misc. Devices MISC Please provide the patient with the [facility] HPB cough Syrup. 1 teaspoonful every 4-6 hours PRN. Please provide the patient with 360 ml.  . Morphine Sulfate (MORPHINE CONCENTRATE) 10 mg / 0.5 ml concentrated solution Take 0.5 mLs (10 mg total) by mouth every 4 (four) hours as needed for severe pain.  . pantoprazole (PROTONIX) 40 MG tablet Take 1 tablet (40 mg total) by mouth 2 (two) times daily. Take 30 minutes before breakfast  . prochlorperazine (COMPAZINE) 10 MG tablet TAKE 1 TABLET BY MOUTH EVERY 6 HOURS AS NEEDED FOR NAUSEA AND VOMITING  . SSD 1 % cream APPLY CREAM ONCE A DAY AS INSTRUCTED   No facility-administered encounter medications on file as of 12/30/2018.     ALLERGIES:  No Known Allergies   PHYSICAL EXAM:  ECOG Performance status: 1  Vitals:   12/30/18 0800  BP: 115/76  Pulse: 91  Resp: 16  Temp: 98 F (36.7 C)  SpO2: 99%   Filed Weights   12/30/18 0800  Weight: 191 lb 9.6 oz (86.9 kg)    Physical Exam Vitals signs reviewed.  Constitutional:      Appearance: Normal appearance.  Cardiovascular:     Rate and Rhythm: Normal rate and regular rhythm.     Heart sounds: Normal heart sounds.  Pulmonary:      Effort: Pulmonary effort is normal.     Breath sounds: Normal breath sounds.  Abdominal:     General: There is no distension.  Palpations: Abdomen is soft. There is no mass.  Musculoskeletal:        General: No swelling.  Skin:    General: Skin is warm.  Neurological:     General: No focal deficit present.     Mental Status: He is alert and oriented to person, place, and time.  Psychiatric:        Mood and Affect: Mood normal.      LABORATORY DATA:  I have reviewed the labs as listed.  CBC    Component Value Date/Time   WBC 5.9 12/30/2018 0810   RBC 4.99 12/30/2018 0810   HGB 13.0 12/30/2018 0810   HCT 38.4 (L) 12/30/2018 0810   PLT 175 12/30/2018 0810   MCV 77.0 (L) 12/30/2018 0810   MCH 26.1 12/30/2018 0810   MCHC 33.9 12/30/2018 0810   RDW 15.9 (H) 12/30/2018 0810   LYMPHSABS 2.5 12/30/2018 0810   MONOABS 0.5 12/30/2018 0810   EOSABS 0.1 12/30/2018 0810   BASOSABS 0.0 12/30/2018 0810   CMP Latest Ref Rng & Units 12/30/2018 12/16/2018 12/06/2018  Glucose 70 - 99 mg/dL 117(H) 134(H) 110(H)  BUN 6 - 20 mg/dL _0 Creatinine 0.61 - 1.24 mg/dL 0.92 1.04 0.99  Sodium 135 - 145 mmol/L 138 138 136  Potassium 3.5 - 5.1 mmol/L 3.8 3.8 3.7  Chloride 98 - 111 mmol/L 102 102 103  CO2 22 - 32 mmol/L _1 Calcium 8.9 - 10.3 mg/dL 9.2 9.2 8.7(L)  Total Protein 6.5 - 8.1 g/dL 7.5 7.4 7.5  Total Bilirubin 0.3 - 1.2 mg/dL 0.8 0.6 0.8  Alkaline Phos 38 - 126 U/L 55 49 56  AST 15 - 41 U/L 13(L) 19 31  ALT 0 - 44 U/L _2 DIAGNOSTIC IMAGING:  I have independently reviewed the scans and discussed with the patient.     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  Stage IV GE junction adenocarcinoma: - 6 cycles of FOLFOX from 02/15/2017 through 04/25/2017 -Paclitaxel and Cyramza from 05/18/2017, paclitaxel discontinued on 09/21/2017 secondary to neuropathy - Foundation 1 CDX showing MS-stable, TMB 3Muts/mb - CT scan on 03/22/2018 showed  resolution of the right supraclavicular adenopathy and mediastinal adenopathy.  Residual soft tissue thickening involving the distal GE junction with no obvious mass.  Largely resolved upper abdominal adenopathy.  There is a persistent partially necrotic celiac axis lymph node which is stable.  No pulmonary metastatic disease. -CT abdomen pelvis on 08/23/2018 shows necrotic nodes with peripheral rim enhancement, largest measuring 19 mm, increased from 18 mm previously.  There is another lymph node which increased to 12 mm from 8 mm previously.  No new lesions seen.  Thickening of the GE junction is stable. - He was evaluated by Dr. Truddie Crumble at [facility].  EGD was done on 11/25/2018. -I talked to Dr. Truddie Crumble about the findings on the EGD.  There was clear progression of the mass.  Dr. Truddie Crumble has recommended changing therapy.  If there is no improvement he will place a stent. - Cyramza was discontinued after 11/15/2018. -2 cycles of FOLFIRI on 12/02/2018 and 12/16/2018. -Irinotecan dose was cut by 20% during cycle 2 secondary to GI side effects. -He has intermittent cramping for which he takes hyoscyamine and morphine mainly at nighttime. -He is able to eat mostly spaghetti without any problems.  Some other solid foods get stuck.  He is also drinking about 4 to 5 cans  of Ensure per day.  2.  Peripheral neuropathy: -He is not taking gabapentin at this time.  This is from paclitaxel which was discontinued on 09/21/2017.       Total time spent is 25 minutes with more than 50% of the time spent face-to-face discussing treatment plan, side effect management and coordination of care.    Orders placed this encounter:  No orders of the defined types were placed in this encounter.     Derek Jack, MD [place] [phone]

## 2018-12-30 NOTE — Assessment & Plan Note (Signed)
1.  Stage IV GE junction adenocarcinoma: - 6 cycles of FOLFOX from 02/15/2017 through 04/25/2017 -Paclitaxel and Cyramza from 05/18/2017, paclitaxel discontinued on 09/21/2017 secondary to neuropathy - Foundation 1 CDX showing MS-stable, TMB 3Muts/mb - CT scan on 03/22/2018 showed resolution of the right supraclavicular adenopathy and mediastinal adenopathy.  Residual soft tissue thickening involving the distal GE junction with no obvious mass.  Largely resolved upper abdominal adenopathy.  There is a persistent partially necrotic celiac axis lymph node which is stable.  No pulmonary metastatic disease. -CT abdomen pelvis on 08/23/2018 shows necrotic nodes with peripheral rim enhancement, largest measuring 19 mm, increased from 18 mm previously.  There is another lymph node which increased to 12 mm from 8 mm previously.  No new lesions seen.  Thickening of the GE junction is stable. - He was evaluated by Dr. Truddie Crumble at [facility].  EGD was done on 11/25/2018. -I talked to Dr. Truddie Crumble about the findings on the EGD.  There was clear progression of the mass.  Dr. Truddie Crumble has recommended changing therapy.  If there is no improvement he will place a stent. - Cyramza was discontinued after 11/15/2018. -2 cycles of FOLFIRI on 12/02/2018 and 12/16/2018. -Irinotecan dose was cut by 20% during cycle 2 secondary to GI side effects. -He has intermittent cramping for which he takes hyoscyamine and morphine mainly at nighttime. -He is able to eat mostly spaghetti without any problems.  Some other solid foods get stuck.  He is also drinking about 4 to 5 cans of Ensure per day.  2.  Peripheral neuropathy: -He is not taking gabapentin at this time.  This is from paclitaxel which was discontinued on 09/21/2017.

## 2018-12-30 NOTE — Progress Notes (Signed)
0850 lab work reviewed and patient seen by Dr. Delton Coombes who approved patient for treatment today.  Maxwell Henderson tolerated FOLFIRI without incident or complaint. VSS. Discharged self ambulatory in satisfactory condition with 5FU pump infusing without incident.

## 2018-12-30 NOTE — Patient Instructions (Signed)
[facility] Discharge Instructions for Patients Receiving Chemotherapy   Beginning January 23rd 2017 lab work for the Cancer Center will be done in the  Main lab at [facility] on 1st floor. If you have a lab appointment with the Cancer Center please come in thru the  Main Entrance and check in at the main information desk   Today you received the following chemotherapy agents FOLFIRI  To help prevent nausea and vomiting after your treatment, we encourage you to take your nausea medication   If you develop nausea and vomiting, or diarrhea that is not controlled by your medication, call the clinic.  The clinic phone number is [phone]. Office hours are Monday-Friday 8:30am-5:00pm.  BELOW ARE SYMPTOMS THAT SHOULD BE REPORTED IMMEDIATELY:  *FEVER GREATER THAN 101.0 F  *CHILLS WITH OR WITHOUT FEVER  NAUSEA AND VOMITING THAT IS NOT CONTROLLED WITH YOUR NAUSEA MEDICATION  *UNUSUAL SHORTNESS OF BREATH  *UNUSUAL BRUISING OR BLEEDING  TENDERNESS IN MOUTH AND THROAT WITH OR WITHOUT PRESENCE OF ULCERS  *URINARY PROBLEMS  *BOWEL PROBLEMS  UNUSUAL RASH Items with * indicate a potential emergency and should be followed up as soon as possible. If you have an emergency after office hours please contact your primary care physician or go to the nearest emergency department.  Please call the clinic during office hours if you have any questions or concerns.   You may also contact the Patient Navigator at [phone] should you have any questions or need assistance in obtaining follow up care.      Resources For Cancer Patients and their Caregivers ? American Cancer Society: Can assist with transportation, wigs, general needs, runs Look Good Feel Better.        1-888-227-6333 ? Cancer Care: Provides financial assistance, online support groups, medication/co-pay assistance.  1-800-813-HOPE (4673) ? [facility] Assists [place] cancer  patients and their families through emotional , educational and financial support.  336-427-4357 ? [place] DSS Where to apply for food stamps, Medicaid and utility assistance. 336-342-1394 ? RCATS: Transportation to medical appointments. 336-347-2287 ? Social Security Administration: May apply for disability if have a Stage IV cancer. 336-342-7796 1-800-772-1213 ? [place] Aging, Disability and Transit Services: Assists with nutrition, care and transit needs. 336-349-2343          

## 2018-12-30 NOTE — Patient Instructions (Signed)
[facility] at [facility] Discharge Instructions  You were seen today by Dr. Katragadda. He went over your recent lab results. He will see you back in 2 weeks for labs, treatment and follow up.   Thank you for choosing [facility] at [facility] to provide your oncology and hematology care.  To afford each patient quality time with our provider, please arrive at least 15 minutes before your scheduled appointment time.   If you have a lab appointment with the Cancer Center please come in thru the  Main Entrance and check in at the main information desk  You need to re-schedule your appointment should you arrive 10 or more minutes late.  We strive to give you quality time with our providers, and arriving late affects you and other patients whose appointments are after yours.  Also, if you no show three or more times for appointments you may be dismissed from the clinic at the providers discretion.     Again, thank you for choosing [facility].  Our hope is that these requests will decrease the amount of time that you wait before being seen by our physicians.       _____________________________________________________________  Should you have questions after your visit to [facility], please contact our office at [phone] between the hours of 8:00 a.m. and 4:30 p.m.  Voicemails left after 4:00 p.m. will not be returned until the following business day.  For prescription refill requests, have your pharmacy contact our office and allow 72 hours.    Cancer Center Support Programs:   > Cancer Support Group  2nd Tuesday of the month 1pm-2pm, Journey Room    

## 2018-12-31 ENCOUNTER — Other Ambulatory Visit (HOSPITAL_COMMUNITY): Payer: Self-pay | Admitting: *Deleted

## 2018-12-31 MED ORDER — MORPHINE SULFATE (CONCENTRATE) 10 MG /0.5 ML PO SOLN: 10.0000 mg | ORAL | 0 refills | Status: DC | PRN

## 2019-01-01 ENCOUNTER — Inpatient Hospital Stay (HOSPITAL_COMMUNITY): Payer: Medicaid Other

## 2019-01-01 ENCOUNTER — Other Ambulatory Visit: Payer: Self-pay

## 2019-01-01 VITALS — BP 107/70 | HR 73 | Temp 99.2°F | Resp 18

## 2019-01-01 DIAGNOSIS — C159 Malignant neoplasm of esophagus, unspecified: Secondary | ICD-10-CM

## 2019-01-01 DIAGNOSIS — Z5111 Encounter for antineoplastic chemotherapy: Secondary | ICD-10-CM | POA: Diagnosis not present

## 2019-01-01 MED ORDER — HEPARIN SOD (PORK) LOCK FLUSH 100 UNIT/ML IV SOLN
500.0000 [IU] | Freq: Once | INTRAVENOUS | Status: AC | PRN
  Administered 2019-01-01: 500 [IU]

## 2019-01-01 MED ORDER — SODIUM CHLORIDE 0.9% FLUSH
10.0000 mL | INTRAVENOUS | Status: DC | PRN
  Administered 2019-01-01: 10 mL
  Filled 2019-01-01: qty 10

## 2019-01-01 NOTE — Progress Notes (Signed)
Maxwell Henderson returns today for port de access and flush after 46 hr continous infusion of 37fu. Tolerated infusion without problems. Portacath located R chest wall was  deaccessed and flushed with 23ml NS and 500U/76ml Heparin and needle removed intact.  Procedure without incident. Patient tolerated procedure well.

## 2019-01-10 ENCOUNTER — Ambulatory Visit (HOSPITAL_COMMUNITY): Payer: Medicaid Other

## 2019-01-10 NOTE — Progress Notes (Signed)
Nutrition  RD working remotely.  Called patient for nutrition follow-up.  No answer.  Left message on voicemail box to return RD's call.   Anastassia Noack B. Zenia Resides, [place], [place] Registered Dietitian [phone] (pager)

## 2019-01-13 ENCOUNTER — Inpatient Hospital Stay (HOSPITAL_COMMUNITY): Payer: Medicaid Other

## 2019-01-13 ENCOUNTER — Inpatient Hospital Stay (HOSPITAL_COMMUNITY): Payer: Medicaid Other | Attending: Hematology

## 2019-01-13 ENCOUNTER — Encounter (HOSPITAL_COMMUNITY): Payer: Self-pay | Admitting: Hematology

## 2019-01-13 ENCOUNTER — Inpatient Hospital Stay (HOSPITAL_COMMUNITY): Payer: Medicaid Other | Attending: Hematology | Admitting: Hematology

## 2019-01-13 ENCOUNTER — Other Ambulatory Visit: Payer: Self-pay

## 2019-01-13 ENCOUNTER — Other Ambulatory Visit (HOSPITAL_COMMUNITY): Payer: Self-pay | Admitting: Nurse Practitioner

## 2019-01-13 VITALS — BP 129/78 | HR 65 | Temp 98.5°F | Resp 18

## 2019-01-13 DIAGNOSIS — C159 Malignant neoplasm of esophagus, unspecified: Secondary | ICD-10-CM

## 2019-01-13 DIAGNOSIS — C770 Secondary and unspecified malignant neoplasm of lymph nodes of head, face and neck: Secondary | ICD-10-CM | POA: Diagnosis not present

## 2019-01-13 DIAGNOSIS — Z452 Encounter for adjustment and management of vascular access device: Secondary | ICD-10-CM | POA: Diagnosis not present

## 2019-01-13 DIAGNOSIS — Z87891 Personal history of nicotine dependence: Secondary | ICD-10-CM

## 2019-01-13 DIAGNOSIS — Z5111 Encounter for antineoplastic chemotherapy: Secondary | ICD-10-CM | POA: Insufficient documentation

## 2019-01-13 DIAGNOSIS — R911 Solitary pulmonary nodule: Secondary | ICD-10-CM

## 2019-01-13 DIAGNOSIS — C158 Malignant neoplasm of overlapping sites of esophagus: Secondary | ICD-10-CM

## 2019-01-13 DIAGNOSIS — R634 Abnormal weight loss: Secondary | ICD-10-CM

## 2019-01-13 DIAGNOSIS — C155 Malignant neoplasm of lower third of esophagus: Secondary | ICD-10-CM | POA: Diagnosis present

## 2019-01-13 DIAGNOSIS — R1310 Dysphagia, unspecified: Secondary | ICD-10-CM

## 2019-01-13 DIAGNOSIS — R39198 Other difficulties with micturition: Secondary | ICD-10-CM

## 2019-01-13 LAB — COMPREHENSIVE METABOLIC PANEL
ALT: 18 U/L (ref 0–44)
AST: 19 U/L (ref 15–41)
Albumin: 4.3 g/dL (ref 3.5–5.0)
Alkaline Phosphatase: 55 U/L (ref 38–126)
Anion gap: 13 (ref 5–15)
BUN: 10 mg/dL (ref 6–20)
CO2: 23 mmol/L (ref 22–32)
Calcium: 9.3 mg/dL (ref 8.9–10.3)
Chloride: 103 mmol/L (ref 98–111)
Creatinine, Ser: 1.13 mg/dL (ref 0.61–1.24)
GFR calc Af Amer: >60 mL/min (ref >60)
GFR calc non Af Amer: >60 mL/min (ref >60)
Glucose, Bld: 180 mg/dL — ABNORMAL HIGH (ref 70–99)
Potassium: 3.9 mmol/L (ref 3.5–5.1)
Sodium: 139 mmol/L (ref 135–145)
Total Bilirubin: 0.7 mg/dL (ref 0.3–1.2)
Total Protein: 7.6 g/dL (ref 6.5–8.1)

## 2019-01-13 LAB — CBC WITH DIFFERENTIAL/PLATELET
Abs Immature Granulocytes: 0.01 10*3/uL (ref 0.00–0.07)
Basophils Absolute: 0 10*3/uL (ref 0.0–0.1)
Basophils Relative: 0 %
Eosinophils Absolute: 0.2 10*3/uL (ref 0.0–0.5)
Eosinophils Relative: 4 %
HCT: 36.9 % — ABNORMAL LOW (ref 39.0–52.0)
Hemoglobin: 12.8 g/dL — ABNORMAL LOW (ref 13.0–17.0)
Immature Granulocytes: 0 %
Lymphocytes Relative: 35 %
Lymphs Abs: 1.8 10*3/uL (ref 0.7–4.0)
MCH: 27.1 pg (ref 26.0–34.0)
MCHC: 34.7 g/dL (ref 30.0–36.0)
MCV: 78 fL — ABNORMAL LOW (ref 80.0–100.0)
Monocytes Absolute: 0.3 10*3/uL (ref 0.1–1.0)
Monocytes Relative: 6 %
Neutro Abs: 2.7 10*3/uL (ref 1.7–7.7)
Neutrophils Relative %: 55 %
Platelets: 206 10*3/uL (ref 150–400)
RBC: 4.73 MIL/uL (ref 4.22–5.81)
RDW: 16.4 % — ABNORMAL HIGH (ref 11.5–15.5)
WBC: 5 10*3/uL (ref 4.0–10.5)
nRBC: 0 % (ref 0.0–0.2)

## 2019-01-13 MED ORDER — SODIUM CHLORIDE 0.9% FLUSH
10.0000 mL | INTRAVENOUS | Status: DC | PRN
  Administered 2019-01-13: 10 mL
  Filled 2019-01-13: qty 10

## 2019-01-13 MED ORDER — SODIUM CHLORIDE 0.9 % IV SOLN
10.0000 mg | Freq: Once | INTRAVENOUS | Status: AC
  Administered 2019-01-13: 10 mg via INTRAVENOUS
  Filled 2019-01-13: qty 10

## 2019-01-13 MED ORDER — FLUOROURACIL CHEMO INJECTION 2.5 GM/50ML
400.0000 mg/m2 | Freq: Once | INTRAVENOUS | Status: AC
  Administered 2019-01-13: 850 mg via INTRAVENOUS
  Filled 2019-01-13: qty 17

## 2019-01-13 MED ORDER — SODIUM CHLORIDE 0.9 % IV SOLN
Freq: Once | INTRAVENOUS | Status: AC
  Administered 2019-01-13: 11:00:00 via INTRAVENOUS

## 2019-01-13 MED ORDER — LEUCOVORIN CALCIUM INJECTION 350 MG
400.0000 mg/m2 | Freq: Once | INTRAVENOUS | Status: AC
  Administered 2019-01-13: 864 mg via INTRAVENOUS
  Filled 2019-01-13: qty 43.2

## 2019-01-13 MED ORDER — ATROPINE SULFATE 1 MG/ML IJ SOLN
1.0000 mg | Freq: Once | INTRAMUSCULAR | Status: AC
  Administered 2019-01-13: 1 mg via INTRAVENOUS
  Filled 2019-01-13: qty 1

## 2019-01-13 MED ORDER — SODIUM CHLORIDE 0.9 % IV SOLN
Freq: Once | INTRAVENOUS | Status: AC
  Administered 2019-01-13: 10:00:00 via INTRAVENOUS

## 2019-01-13 MED ORDER — PALONOSETRON HCL INJECTION 0.25 MG/5ML
0.2500 mg | Freq: Once | INTRAVENOUS | Status: AC
  Administered 2019-01-13: 0.25 mg via INTRAVENOUS
  Filled 2019-01-13: qty 5

## 2019-01-13 MED ORDER — SODIUM CHLORIDE 0.9 % IV SOLN
2320.0000 mg/m2 | INTRAVENOUS | Status: DC
  Administered 2019-01-13: 5000 mg via INTRAVENOUS
  Filled 2019-01-13: qty 100

## 2019-01-13 MED ORDER — TAMSULOSIN HCL 0.4 MG PO CAPS: 0.4000 mg | ORAL_CAPSULE | Freq: Every day | ORAL | 0 refills | Status: DC

## 2019-01-13 MED ORDER — IRINOTECAN HCL CHEMO INJECTION 100 MG/5ML
144.0000 mg/m2 | Freq: Once | INTRAVENOUS | Status: AC
  Administered 2019-01-13: 320 mg via INTRAVENOUS
  Filled 2019-01-13: qty 15

## 2019-01-13 NOTE — Patient Instructions (Signed)
[facility] Discharge Instructions for Patients Receiving Chemotherapy   Beginning January 23rd 2017 lab work for the Cancer Center will be done in the  Main lab at [facility] on 1st floor. If you have a lab appointment with the Cancer Center please come in thru the  Main Entrance and check in at the main information desk   Today you received the following chemotherapy agents Irinotecan,Leucovorin and 5FU. Follow-up as scheduled. Call clinic for any questions or concerns  To help prevent nausea and vomiting after your treatment, we encourage you to take your nausea medication   If you develop nausea and vomiting, or diarrhea that is not controlled by your medication, call the clinic.  The clinic phone number is [phone]. Office hours are Monday-Friday 8:30am-5:00pm.  BELOW ARE SYMPTOMS THAT SHOULD BE REPORTED IMMEDIATELY:  *FEVER GREATER THAN 101.0 F  *CHILLS WITH OR WITHOUT FEVER  NAUSEA AND VOMITING THAT IS NOT CONTROLLED WITH YOUR NAUSEA MEDICATION  *UNUSUAL SHORTNESS OF BREATH  *UNUSUAL BRUISING OR BLEEDING  TENDERNESS IN MOUTH AND THROAT WITH OR WITHOUT PRESENCE OF ULCERS  *URINARY PROBLEMS  *BOWEL PROBLEMS  UNUSUAL RASH Items with * indicate a potential emergency and should be followed up as soon as possible. If you have an emergency after office hours please contact your primary care physician or go to the nearest emergency department.  Please call the clinic during office hours if you have any questions or concerns.   You may also contact the Patient Navigator at [phone] should you have any questions or need assistance in obtaining follow up care.      Resources For Cancer Patients and their Caregivers ? American Cancer Society: Can assist with transportation, wigs, general needs, runs Look Good Feel Better.        1-888-227-6333 ? Cancer Care: Provides financial assistance, online support groups, medication/co-pay assistance.   1-800-813-HOPE (4673) ? [facility] Assists [place] cancer patients and their families through emotional , educational and financial support.  336-427-4357 ? [place] DSS Where to apply for food stamps, Medicaid and utility assistance. 336-342-1394 ? RCATS: Transportation to medical appointments. 336-347-2287 ? Social Security Administration: May apply for disability if have a Stage IV cancer. 336-342-7796 1-800-772-1213 ? [place] Aging, Disability and Transit Services: Assists with nutrition, care and transit needs. 336-349-2343         

## 2019-01-13 NOTE — Progress Notes (Signed)
0950 Labs reviewed with and pt seen by Dr. Delton Coombes and pt approved for chemo tx today per MD                                                                                     Tracey Harries tolerated chemo tx well without complaints or incident. Pt discharged with 5FU pump infusing without issues. VSS upon discharge. Pt discharged self ambulatory in satisfactory condition

## 2019-01-13 NOTE — Patient Instructions (Addendum)
[facility] Discharge Instructions  You were seen today by Dr. Delton Coombes. He went over your recent lab results. He will see you back in 2 weeks for labs and follow up.  Try taking Gas-x or simethicone for the gas/ bloating feeling. Start taking a stool softener daily.   Thank you for choosing [place] at [facility] to provide your oncology and hematology care.  To afford each patient quality time with our provider, please arrive at least 15 minutes before your scheduled appointment time.   If you have a lab appointment with the [facility] please come in thru the  Main Entrance and check in at the main information desk  You need to re-schedule your appointment should you arrive 10 or more minutes late.  We strive to give you quality time with our providers, and arriving late affects you and other patients whose appointments are after yours.  Also, if you no show three or more times for appointments you may be dismissed from the clinic at the providers discretion.     Again, thank you for choosing [facility].  Our hope is that these requests will decrease the amount of time that you wait before being seen by our physicians.       _____________________________________________________________  Should you have questions after your visit to [facility], please contact our office at [phone] between the hours of 8:00 a.m. and 4:30 p.m.  Voicemails left after 4:00 p.m. will not be returned until the following business day.  For prescription refill requests, have your pharmacy contact our office and allow 72 hours.    Cancer Center Support Programs:   > Cancer Support Group  2nd Tuesday of the month 1pm-2pm, Journey Room

## 2019-01-13 NOTE — Progress Notes (Signed)
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 Barney Henderson. Partially obstructing, likely malignant esophageal tumor was found in the distal esophagus. Biopsied with surgical path demonstrating Adenocarcinoma. Injected. Treated with argon plasma coagulation (APC) #3. 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 Biopsied with surgical path demonstrating Adenocarcinoma.    01/19/2017 PET scan    NECK Two FDG avid nodes are seen in the base of the neck on the right. The first is seen on CT image 35, just posterior to the right thyroid lobe and the other is seen on CT image 41 measuring 16 mm in short axis with a maximum SUV of 8.6.  CHEST  The patient's known malignancy involving the distal esophagus and cardia of the stomach is FDG avid with a maximum SUV of 10.4. No definitive metastatic nodes within the chest.  ABDOMEN/PELVIS  FDG avid metastatic adenopathy is seen in the gastrohepatic ligament, paraceliac nodes, posterior to the IVC on image 111, and to the left of the SMA on image 112. The most inferior node is seen in the aortocaval region on image 117. The representative node to the left of the SMA was measured on series 4, image 111 measuring 2.5 by 2.1 cm with a maximum SUV of 7. No other FDG avid disease in the abdomen or pelvis. Cholelithiasis is identified.    02/02/2017 Procedure    Maxwell Henderson guided bx of right supraclavicular LN by IR.    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    02/10/2017 Pathology Results    HER2 - NEGATIVE    02/13/2017 Procedure    Port placed by IR    02/15/2017 - 04/25/2017 Chemotherapy    FOLFOX x 6 cycles     05/02/2017 Imaging    CT C/A/P: IMPRESSION: 1. Nodular thickening and hyperenhancement in the distal esophagus and gastric cardia could reflect residual tumor. 2. No significant change in size of previously demonstrated hypermetabolic upper abdominal lymph nodes. No progressive adenopathy identified. No residual enlarged supraclavicular nodes are seen. 3. No evidence of progressive metastatic disease. No worrisome hepatic findings. 4. Tiny right upper lobe pulmonary nodule, not clearly seen on low dose previous PET-CT. Attention on follow-up recommended. 5. Cholelithiasis.    12/02/2018 -  Chemotherapy    The patient had palonosetron (ALOXI) injection 0.25 mg, 0.25 mg, Intravenous,  Once, 4 of 6 cycles Administration: 0.25 mg (12/02/2018), 0.25 mg (12/16/2018), 0.25 mg (12/30/2018) irinotecan (CAMPTOSAR) 380 mg in dextrose 5 % 500 mL chemo infusion, 180 mg/m2 = 380 mg, Intravenous,  Once, 4 of 6 cycles Dose modification: 144 mg/m2 (80 % of original dose 180 mg/m2, Cycle 2, Reason: Provider Judgment) Administration: 380 mg (12/02/2018), 320 mg (12/16/2018), 320 mg (12/30/2018) leucovorin 864 mg in dextrose 5 % 250 mL infusion, 400 mg/m2 = 864 mg, Intravenous,  Once, 4 of 6 cycles Administration: 864 mg (12/02/2018), 864 mg (12/16/2018), 864 mg (12/30/2018) fluorouracil (ADRUCIL) chemo injection 850 mg, 400 mg/m2 = 850 mg, Intravenous,  Once, 4 of 6 cycles Administration:  850 mg (12/02/2018), 850 mg (12/16/2018), 850 mg (12/30/2018) fluorouracil (ADRUCIL) 5,000 mg in sodium chloride 0.9 % 150 mL chemo infusion, 2,320 mg/m2 = 5,200 mg, Intravenous, 1 Day/Dose, 4 of 6 cycles Administration: 5,000 mg (12/02/2018), 5,000 mg (12/16/2018), 5,000 mg (12/30/2018)  for chemotherapy treatment.       Henderson STAGING: Henderson Staging Primary  esophageal adenocarcinoma [facility]) Staging form: Esophagus - Adenocarcinoma, AJCC 8th Edition - Clinical stage from 02/11/2017: Stage IVA (cTX, cN3, cM0) - Signed by Maxwell Cancer, Maxwell Henderson on 02/11/2017    INTERVAL HISTORY:  Maxwell Henderson 53 y.o. male returns for routine follow-up and consideration for next cycle of chemotherapy. He is here today alone. He states that he has been feeling good since his last treatment. He states that he has had trouble swallowing. He states that he continues to drink Ensure 3-4 times a day. He states that he had terrible constipation last week. He states that he has lost weight since his last visit. Denies any nausea, vomiting, or diarrhea. Denies any new pains. Had not noticed any recent bleeding such as epistaxis, hematuria or hematochezia. Denies recent chest pain on exertion, shortness of breath on minimal exertion, pre-syncopal episodes, or palpitations. Denies any numbness or tingling in hands or feet. Denies any recent fevers, infections, or recent hospitalizations. Patient reports appetite at 50% and energy level at 75%.      REVIEW OF SYSTEMS:  Review of Systems  Constitutional: Positive for appetite change.  HENT:   Positive for trouble swallowing.   Gastrointestinal: Positive for constipation.  Genitourinary: Positive for difficulty urinating.      PAST MEDICAL/SURGICAL HISTORY:  Past Medical History:  Diagnosis Date  . Hypertension   . 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 Primary esophageal adenocarcinoma ([place]) 01/29/2017   Past Surgical History:  Procedure Laterality Date  . ESOPHAGOGASTRODUODENOSCOPY (EGD) WITH PROPOFOL N/A 01/16/2017   Procedure: ESOPHAGOGASTRODUODENOSCOPY (EGD) WITH PROPOFOL;  Surgeon: Maxwell Binder, MD;  Location: [facility] ENDO SUITE;  Service: Endoscopy;  Laterality: N/A;  730   . IR FLUORO GUIDE PORT INSERTION RIGHT  02/13/2017  . IR Maxwell Henderson GUIDE VASC ACCESS RIGHT  02/13/2017  . lipoma removal    . PORTA CATH  INSERTION Right 02/13/2017  . SAVORY DILATION N/A 01/16/2017   Procedure: SAVORY DILATION;  Surgeon: Maxwell Binder, MD;  Location: [facility] ENDO SUITE;  Service: Endoscopy;  Laterality: N/A;     SOCIAL HISTORY:  Social History   Socioeconomic History  . Marital status: Married    Spouse name: Not on file  . Number of children: Not on file  . Years of education: Not on file  . Highest education level: Not on file  Occupational History  . Not on file  Social Needs  . Financial resource strain: Not on file  . Food insecurity:    Worry: Not on file    Inability: Not on file  . Transportation needs:    Medical: Not on file    Non-medical: Not on file  Tobacco Use  . Smoking status: Former Smoker    Packs/day: 0.25    Types: Cigarettes  . Smokeless tobacco: Never Used  Substance and Sexual Activity  . Alcohol use: No    Comment: hx heavy alcohol, hasn't drank in 19 yrs  . Drug use: Yes    Types: Marijuana    Comment: history of marijuana   . Sexual activity: Yes  Lifestyle  . Physical activity:    Days  per week: Not on file    Minutes per session: Not on file  . Stress: Not on file  Relationships  . Social connections:    Talks on phone: Not on file    Gets together: Not on file    Attends religious service: Not on file    Active member of club or organization: Not on file    Attends meetings of clubs or organizations: Not on file    Relationship status: Not on file  . Intimate partner violence:    Fear of current or ex partner: Not on file    Emotionally abused: Not on file    Physically abused: Not on file    Forced sexual activity: Not on file  Other Topics Concern  . Not on file  Social History Narrative   WORKING [employer].    FAMILY HISTORY:  Family History  Problem Relation Age of Onset  . Prostate Henderson Father   . Colon Henderson Neg Hx   . Colon polyps Neg Hx     CURRENT MEDICATIONS:  Outpatient Encounter Medications as of 01/13/2019   Medication Sig  . acetaminophen (TYLENOL) 325 MG tablet Take 2 tablets (650 mg total) by mouth every 4 (four) hours as needed for headache.  . Alpha-Lipoic Acid 600 MG CAPS Take 1 capsule (600 mg total) by mouth daily.  Marland Kitchen amLODipine (NORVASC) 10 MG tablet Take 1 tablet by mouth once daily  . B Complex-C (B-COMPLEX WITH VITAMIN C) tablet Take 1 tablet by mouth daily.  Marland Kitchen dexamethasone (DECADRON) 4 MG tablet Take 8 mg by mouth 2 (two) times daily.  Marland Kitchen gabapentin (NEURONTIN) 300 MG capsule Take 1 capsule by mouth twice daily  . Hyoscyamine Sulfate SL (LEVSIN/SL) 0.125 MG SUBL Place 1 tablet under the tongue every 4 (four) hours as needed.  . lidocaine-prilocaine (EMLA) cream Apply 1 application topically as needed.  . loratadine (CLARITIN) 10 MG tablet Take 1 tablet (10 mg total) by mouth daily.  . Misc. Devices MISC Please provide the patient with the [facility] HPB cough Syrup. 1 teaspoonful every 4-6 hours PRN. Please provide the patient with 360 ml.  . Morphine Sulfate (MORPHINE CONCENTRATE) 10 mg / 0.5 ml concentrated solution Take 0.5 mLs (10 mg total) by mouth every 4 (four) hours as needed for severe pain.  . pantoprazole (PROTONIX) 40 MG tablet Take 1 tablet (40 mg total) by mouth 2 (two) times daily. Take 30 minutes before breakfast  . prochlorperazine (COMPAZINE) 10 MG tablet TAKE 1 TABLET BY MOUTH EVERY 6 HOURS AS NEEDED FOR NAUSEA AND VOMITING  . SSD 1 % cream APPLY CREAM ONCE A DAY AS INSTRUCTED   No facility-administered encounter medications on file as of 01/13/2019.     ALLERGIES:  No Known Allergies   PHYSICAL EXAM:  ECOG Performance status: 1  Vitals:   01/13/19 0905  BP: 121/81  Pulse: 74  Resp: 18  Temp: 97.8 F (36.6 C)  SpO2: 100%   Filed Weights   01/13/19 0905  Weight: 186 lb (84.4 kg)    Physical Exam Vitals signs reviewed.  Constitutional:      Appearance: Normal appearance.  Cardiovascular:     Rate and Rhythm: Normal rate and regular rhythm.      Heart sounds: Normal heart sounds.  Pulmonary:     Effort: Pulmonary effort is normal.     Breath sounds: Normal breath sounds.  Abdominal:     General: There is no distension.  Palpations: Abdomen is soft. There is no mass.  Musculoskeletal:        General: No swelling.  Neurological:     General: No focal deficit present.     Mental Status: He is alert and oriented to person, place, and time.  Psychiatric:        Mood and Affect: Mood normal.        Behavior: Behavior normal.      LABORATORY DATA:  I have reviewed the labs as listed.  CBC    Component Value Date/Time   WBC 5.0 01/13/2019 0852   RBC 4.73 01/13/2019 0852   HGB 12.8 (L) 01/13/2019 0852   HCT 36.9 (L) 01/13/2019 0852   PLT 206 01/13/2019 0852   MCV 78.0 (L) 01/13/2019 0852   MCH 27.1 01/13/2019 0852   MCHC 34.7 01/13/2019 0852   RDW 16.4 (H) 01/13/2019 0852   LYMPHSABS 1.8 01/13/2019 0852   MONOABS 0.3 01/13/2019 0852   EOSABS 0.2 01/13/2019 0852   BASOSABS 0.0 01/13/2019 0852   CMP Latest Ref Rng & Units 01/13/2019 12/30/2018 12/16/2018  Glucose 70 - 99 mg/dL 180(H) 117(H) 134(H)  BUN 6 - 20 mg/dL '10 11 14  ' Creatinine 0.61 - 1.24 mg/dL 1.13 0.92 1.04  Sodium 135 - 145 mmol/L 139 138 138  Potassium 3.5 - 5.1 mmol/L 3.9 3.8 3.8  Chloride 98 - 111 mmol/L 103 102 102  CO2 22 - 32 mmol/L '23 26 25  ' Calcium 8.9 - 10.3 mg/dL 9.3 9.2 9.2  Total Protein 6.5 - 8.1 g/dL 7.6 7.5 7.4  Total Bilirubin 0.3 - 1.2 mg/dL 0.7 0.8 0.6  Alkaline Phos 38 - 126 U/L 55 55 49  AST 15 - 41 U/L 19 13(L) 19  ALT 0 - 44 U/L '18 14 16       ' DIAGNOSTIC IMAGING:  I have independently reviewed the scans and discussed with the patient.   I have reviewed Venita Lick LPN's note and agree with the documentation.  I personally performed a face-to-face visit, made revisions and my assessment and plan is as follows.    ASSESSMENT & PLAN:   Primary esophageal adenocarcinoma ([place]) 1.  Stage IV GE junction adenocarcinoma: - 6  cycles of FOLFOX from 02/15/2017 through 04/25/2017 -Paclitaxel and Cyramza from 05/18/2017, paclitaxel discontinued on 09/21/2017 secondary to neuropathy - Foundation 1 CDX showing MS-stable, TMB 3Muts/mb - CT scan on 03/22/2018 showed resolution of the right supraclavicular adenopathy and mediastinal adenopathy.  Residual soft tissue thickening involving the distal GE junction with no obvious mass.  Largely resolved upper abdominal adenopathy.  There is a persistent partially necrotic celiac axis lymph node which is stable.  No pulmonary metastatic disease. -CT abdomen pelvis on 08/23/2018 shows necrotic nodes with peripheral rim enhancement, largest measuring 19 mm, increased from 18 mm previously.  There is another lymph node which increased to 12 mm from 8 mm previously.  No new lesions seen.  Thickening of the GE junction is stable. - He was evaluated by Dr. Truddie Crumble at [facility].  EGD was done on 11/25/2018. -I talked to Dr. Truddie Crumble about the findings on the EGD.  There was clear progression of the mass.  Dr. Truddie Crumble has recommended changing therapy.  If there is no improvement he will place a stent. - Cyramza was discontinued after 11/15/2018 due to progression. -3 cycles of FOLFIRI on 12/02/2018 through 12/30/2018.  Irinotecan dose was cut by 20% during cycle 2 secondary to GI side effects. -His abdominal cramping has improved.  He is not requiring hyoscyamine and morphine.  He experienced severe constipation last week. -He reports food getting stuck when he eats.  He has lost 5 pounds since last visit.  Overall he thinks swallowing ability has improved since the start of chemotherapy.  However he is not able to eat breads and certain meats. -I will call and talk to Dr. Truddie Crumble to see if he can benefit from EGD and possible stent. -We will see him back in 2 weeks for follow-up.  Today we will proceed with cycle 4.  I plan to repeat scans after cycle 6.  2.  Peripheral neuropathy: -He is not taking  gabapentin at this time.  This is from paclitaxel which was discontinued on 09/21/2017.  3.  Difficulty urination: - He has developed urinary hesitancy and occasional retention.  He is not continually taking hyoscyamine. -We will start him on Flomax at bedtime.       Total time spent is 25 minutes with more than 50% of the time spent face-to-face discussing treatment plan and coordination of care.    Orders placed this encounter:  No orders of the defined types were placed in this encounter.     Derek Jack, MD [place] [phone]

## 2019-01-13 NOTE — Assessment & Plan Note (Signed)
1.  Stage IV GE junction adenocarcinoma: - 6 cycles of FOLFOX from 02/15/2017 through 04/25/2017 -Paclitaxel and Cyramza from 05/18/2017, paclitaxel discontinued on 09/21/2017 secondary to neuropathy - Foundation 1 CDX showing MS-stable, TMB 3Muts/mb - CT scan on 03/22/2018 showed resolution of the right supraclavicular adenopathy and mediastinal adenopathy.  Residual soft tissue thickening involving the distal GE junction with no obvious mass.  Largely resolved upper abdominal adenopathy.  There is a persistent partially necrotic celiac axis lymph node which is stable.  No pulmonary metastatic disease. -CT abdomen pelvis on 08/23/2018 shows necrotic nodes with peripheral rim enhancement, largest measuring 19 mm, increased from 18 mm previously.  There is another lymph node which increased to 12 mm from 8 mm previously.  No new lesions seen.  Thickening of the GE junction is stable. - He was evaluated by Dr. Truddie Crumble at [facility].  EGD was done on 11/25/2018. -I talked to Dr. Truddie Crumble about the findings on the EGD.  There was clear progression of the mass.  Dr. Truddie Crumble has recommended changing therapy.  If there is no improvement he will place a stent. - Cyramza was discontinued after 11/15/2018 due to progression. -3 cycles of FOLFIRI on 12/02/2018 through 12/30/2018.  Irinotecan dose was cut by 20% during cycle 2 secondary to GI side effects. -His abdominal cramping has improved.  He is not requiring hyoscyamine and morphine.  He experienced severe constipation last week. -He reports food getting stuck when he eats.  He has lost 5 pounds since last visit.  Overall he thinks swallowing ability has improved since the start of chemotherapy.  However he is not able to eat breads and certain meats. -I will call and talk to Dr. Truddie Crumble to see if he can benefit from EGD and possible stent. -We will see him back in 2 weeks for follow-up.  Today we will proceed with cycle 4.  I plan to repeat scans after cycle 6.  2.   Peripheral neuropathy: -He is not taking gabapentin at this time.  This is from paclitaxel which was discontinued on 09/21/2017.  3.  Difficulty urination: - He has developed urinary hesitancy and occasional retention.  He is not continually taking hyoscyamine. -We will start him on Flomax at bedtime.

## 2019-01-15 ENCOUNTER — Other Ambulatory Visit: Payer: Self-pay

## 2019-01-15 ENCOUNTER — Inpatient Hospital Stay (HOSPITAL_COMMUNITY): Payer: Medicaid Other

## 2019-01-15 DIAGNOSIS — C159 Malignant neoplasm of esophagus, unspecified: Secondary | ICD-10-CM

## 2019-01-15 DIAGNOSIS — Z5111 Encounter for antineoplastic chemotherapy: Secondary | ICD-10-CM | POA: Diagnosis not present

## 2019-01-15 MED ORDER — SODIUM CHLORIDE 0.9% FLUSH
10.0000 mL | INTRAVENOUS | Status: DC | PRN
  Administered 2019-01-15: 10 mL
  Filled 2019-01-15: qty 10

## 2019-01-15 MED ORDER — HEPARIN SOD (PORK) LOCK FLUSH 100 UNIT/ML IV SOLN
500.0000 [IU] | Freq: Once | INTRAVENOUS | Status: AC | PRN
  Administered 2019-01-15: 500 [IU]

## 2019-01-15 NOTE — Progress Notes (Signed)
Maxwell Henderson returns today for port de access and flush after 46 hr continous infusion of 34fu. Tolerated infusion without problems. Portacath located right chest wall was  deaccessed and flushed with 30ml NS and 500U/7ml Heparin and needle removed intact.  Procedure without incident. Patient tolerated procedure well. . Vitals stable and discharged home from clinic ambulatory. Follow up as scheduled.

## 2019-01-15 NOTE — Patient Instructions (Signed)
[facility] at [facility]  Discharge Instructions:   _______________________________________________________________  Thank you for choosing [facility] at [facility] to provide your oncology and hematology care.  To afford each patient quality time with our providers, please arrive at least 15 minutes before your scheduled appointment.  You need to re-schedule your appointment if you arrive 10 or more minutes late.  We strive to give you quality time with our providers, and arriving late affects you and other patients whose appointments are after yours.  Also, if you no show three or more times for appointments you may be dismissed from the clinic.  Again, thank you for choosing [facility] at [facility]. Our hope is that these requests will allow you access to exceptional care and in a timely manner. _______________________________________________________________  If you have questions after your visit, please contact our office at [phone] between the hours of 8:30 a.m. and 5:00 p.m. Voicemails left after 4:30 p.m. will not be returned until the following business day. _______________________________________________________________  For prescription refill requests, have your pharmacy contact our office. _______________________________________________________________  Recommendations made by the consultant and any test results will be sent to your referring physician. _______________________________________________________________ 

## 2019-01-28 ENCOUNTER — Ambulatory Visit (HOSPITAL_COMMUNITY): Payer: Medicaid Other

## 2019-01-28 ENCOUNTER — Ambulatory Visit (HOSPITAL_COMMUNITY): Payer: Medicaid Other | Admitting: Hematology

## 2019-01-28 ENCOUNTER — Other Ambulatory Visit (HOSPITAL_COMMUNITY): Payer: Medicaid Other

## 2019-01-30 ENCOUNTER — Encounter (HOSPITAL_COMMUNITY): Payer: Medicaid Other

## 2019-01-30 ENCOUNTER — Other Ambulatory Visit (HOSPITAL_COMMUNITY): Payer: Self-pay | Admitting: *Deleted

## 2019-01-30 DIAGNOSIS — C159 Malignant neoplasm of esophagus, unspecified: Secondary | ICD-10-CM

## 2019-01-30 DIAGNOSIS — G6289 Other specified polyneuropathies: Secondary | ICD-10-CM

## 2019-01-30 MED ORDER — LORATADINE 10 MG PO TABS: 10.0000 mg | ORAL_TABLET | Freq: Every day | ORAL | 3 refills | Status: DC

## 2019-02-01 ENCOUNTER — Emergency Department (HOSPITAL_COMMUNITY)
Admission: EM | Admit: 2019-02-01 | Discharge: 2019-02-01 | Discharge status: Absent without leave | Payer: Medicaid Other

## 2019-02-01 NOTE — ED Notes (Signed)
Pt not in lobby when called- Pt left ED per registration

## 2019-02-02 ENCOUNTER — Emergency Department (HOSPITAL_COMMUNITY)
Admission: EM | Admit: 2019-02-02 | Discharge: 2019-02-02 | Disposition: A | Discharge status: Home / Self Care | Payer: Medicaid Other | Attending: Emergency Medicine | Admitting: Emergency Medicine

## 2019-02-02 ENCOUNTER — Other Ambulatory Visit: Payer: Self-pay

## 2019-02-02 ENCOUNTER — Emergency Department (HOSPITAL_COMMUNITY): Payer: Medicaid Other

## 2019-02-02 ENCOUNTER — Encounter (HOSPITAL_COMMUNITY): Payer: Self-pay | Admitting: Emergency Medicine

## 2019-02-02 DIAGNOSIS — Z79899 Other long term (current) drug therapy: Secondary | ICD-10-CM | POA: Diagnosis not present

## 2019-02-02 DIAGNOSIS — E119 Type 2 diabetes mellitus without complications: Secondary | ICD-10-CM | POA: Insufficient documentation

## 2019-02-02 DIAGNOSIS — R1013 Epigastric pain: Secondary | ICD-10-CM | POA: Insufficient documentation

## 2019-02-02 DIAGNOSIS — C159 Malignant neoplasm of esophagus, unspecified: Secondary | ICD-10-CM | POA: Insufficient documentation

## 2019-02-02 DIAGNOSIS — F17210 Nicotine dependence, cigarettes, uncomplicated: Secondary | ICD-10-CM | POA: Insufficient documentation

## 2019-02-02 DIAGNOSIS — I10 Essential (primary) hypertension: Secondary | ICD-10-CM | POA: Diagnosis not present

## 2019-02-02 LAB — CBC WITH DIFFERENTIAL/PLATELET
Abs Immature Granulocytes: 0.03 10*3/uL (ref 0.00–0.07)
Basophils Absolute: 0 10*3/uL (ref 0.0–0.1)
Basophils Relative: 0 %
Eosinophils Absolute: 0.1 10*3/uL (ref 0.0–0.5)
Eosinophils Relative: 2 %
HCT: 43.6 % (ref 39.0–52.0)
Hemoglobin: 14.8 g/dL (ref 13.0–17.0)
Immature Granulocytes: 0 %
Lymphocytes Relative: 27 %
Lymphs Abs: 2 10*3/uL (ref 0.7–4.0)
MCH: 26.5 pg (ref 26.0–34.0)
MCHC: 33.9 g/dL (ref 30.0–36.0)
MCV: 78 fL — ABNORMAL LOW (ref 80.0–100.0)
Monocytes Absolute: 0.8 10*3/uL (ref 0.1–1.0)
Monocytes Relative: 11 %
Neutro Abs: 4.6 10*3/uL (ref 1.7–7.7)
Neutrophils Relative %: 60 %
Platelets: 286 10*3/uL (ref 150–400)
RBC: 5.59 MIL/uL (ref 4.22–5.81)
RDW: 17.4 % — ABNORMAL HIGH (ref 11.5–15.5)
WBC: 7.6 10*3/uL (ref 4.0–10.5)
nRBC: 0 % (ref 0.0–0.2)

## 2019-02-02 LAB — COMPREHENSIVE METABOLIC PANEL
ALT: 14 U/L (ref 0–44)
AST: 17 U/L (ref 15–41)
Albumin: 5.1 g/dL — ABNORMAL HIGH (ref 3.5–5.0)
Alkaline Phosphatase: 59 U/L (ref 38–126)
Anion gap: 13 (ref 5–15)
BUN: 15 mg/dL (ref 6–20)
CO2: 24 mmol/L (ref 22–32)
Calcium: 10 mg/dL (ref 8.9–10.3)
Chloride: 96 mmol/L — ABNORMAL LOW (ref 98–111)
Creatinine, Ser: 1.36 mg/dL — ABNORMAL HIGH (ref 0.61–1.24)
GFR calc Af Amer: >60 mL/min (ref >60)
GFR calc non Af Amer: 60 mL/min — ABNORMAL LOW (ref >60)
Glucose, Bld: 143 mg/dL — ABNORMAL HIGH (ref 70–99)
Potassium: 3 mmol/L — ABNORMAL LOW (ref 3.5–5.1)
Sodium: 133 mmol/L — ABNORMAL LOW (ref 135–145)
Total Bilirubin: 1.4 mg/dL — ABNORMAL HIGH (ref 0.3–1.2)
Total Protein: 8.7 g/dL — ABNORMAL HIGH (ref 6.5–8.1)

## 2019-02-02 LAB — LIPASE, BLOOD: Lipase: 31 U/L (ref 11–51)

## 2019-02-02 LAB — TROPONIN I: Troponin I: <0.03 ng/mL (ref <0.03)

## 2019-02-02 MED ORDER — SODIUM CHLORIDE 0.9 % IV BOLUS
1000.0000 mL | Freq: Once | INTRAVENOUS | Status: AC
  Administered 2019-02-02: 1000 mL via INTRAVENOUS

## 2019-02-02 MED ORDER — MAGNESIUM OXIDE 400 (241.3 MG) MG PO TABS
800.0000 mg | ORAL_TABLET | Freq: Once | ORAL | Status: AC
  Administered 2019-02-02: 800 mg via ORAL
  Filled 2019-02-02: qty 2

## 2019-02-02 MED ORDER — ONDANSETRON HCL 4 MG/2ML IJ SOLN
4.0000 mg | Freq: Once | INTRAMUSCULAR | Status: AC
  Administered 2019-02-02: 4 mg via INTRAVENOUS
  Filled 2019-02-02: qty 2

## 2019-02-02 MED ORDER — PROMETHAZINE HCL 25 MG RE SUPP: 25.0000 mg | Freq: Four times a day (QID) | RECTAL | 0 refills | Status: DC | PRN

## 2019-02-02 MED ORDER — ALUM & MAG HYDROXIDE-SIMETH 200-200-20 MG/5ML PO SUSP
30.0000 mL | Freq: Once | ORAL | Status: AC
  Administered 2019-02-02: 30 mL via ORAL
  Filled 2019-02-02: qty 30

## 2019-02-02 MED ORDER — MORPHINE SULFATE (PF) 4 MG/ML IV SOLN
4.0000 mg | Freq: Once | INTRAVENOUS | Status: AC
  Administered 2019-02-02: 4 mg via INTRAVENOUS
  Filled 2019-02-02: qty 1

## 2019-02-02 MED ORDER — POTASSIUM CHLORIDE CRYS ER 20 MEQ PO TBCR
40.0000 meq | EXTENDED_RELEASE_TABLET | Freq: Once | ORAL | Status: AC
  Administered 2019-02-02: 40 meq via ORAL
  Filled 2019-02-02: qty 2

## 2019-02-02 MED ORDER — IOHEXOL 300 MG/ML  SOLN
100.0000 mL | Freq: Once | INTRAMUSCULAR | Status: AC | PRN
  Administered 2019-02-02: 100 mL via INTRAVENOUS

## 2019-02-02 NOTE — ED Notes (Signed)
ED Provider at bedside. 

## 2019-02-02 NOTE — ED Provider Notes (Signed)
[facility] EMERGENCY DEPARTMENT Provider Note   CSN: [id number] Arrival date & time: 02/02/19  6010    History   Chief Complaint Chief Complaint  Patient presents with  . Abdominal Pain    HPI Maxwell Henderson is a 53 y.o. male.     53 yo M with a chief complaint of epigastric abdominal pain.  Going on for a long time but worsening over the past 3 days.  The patient recently had a esophageal stent placed for dysphasia secondary to his esophageal carcinoma.  He was able to eat a meal afterwards but since then has had significant pain.  Describes it as a squeezing pain.  Seems to come and go.  Having vomiting and unable to tolerate anything by mouth.  Having subjective fevers and chills.  No diarrhea or constipation.  No urinary symptoms.  No cough or congestion.  No shortness of breath.  The history is provided by the patient.  Abdominal Pain Pain location:  Epigastric Pain quality: cramping and squeezing   Pain radiates to:  Does not radiate Pain severity:  Moderate Onset quality:  Gradual Duration:  3 days Timing:  Constant Progression:  Worsening Chronicity:  Recurrent Relieved by:  Nothing Worsened by:  Nothing Ineffective treatments:  None tried Associated symptoms: nausea and vomiting   Associated symptoms: no chest pain, no chills, no diarrhea, no fever and no shortness of breath     Past Medical History:  Diagnosis Date  . Hypertension   . 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 Goals of care, counseling/discussion 11/28/2018  . Type 2 diabetes mellitus without complication, without long-term current use of insulin ([place])   . Essential hypertension   . SIRS (systemic inflammatory response syndrome) ([place]) 12/11/2017  . HCAP (healthcare-associated pneumonia) 12/11/2017  . Infection due to parainfluenza virus 3 12/11/2017  . PNA  (pneumonia) 12/10/2017  . Hypokalemia 12/10/2017  . 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 Dysphagia 01/05/2017    Past Surgical History:  Procedure Laterality Date  . ESOPHAGOGASTRODUODENOSCOPY (EGD) WITH PROPOFOL N/A 01/16/2017   Procedure: ESOPHAGOGASTRODUODENOSCOPY (EGD) WITH PROPOFOL;  Surgeon: [name], MD;  Location: [facility] ENDO SUITE;  Service: Endoscopy;  Laterality: N/A;  730   . IR FLUORO GUIDE PORT INSERTION RIGHT  02/13/2017  . IR US GUIDE VASC ACCESS RIGHT  02/13/2017  . lipoma removal    . PORTA CATH INSERTION Right 02/13/2017  . SAVORY DILATION N/A 01/16/2017   Procedure: SAVORY DILATION;  Surgeon: [name], MD;  Location: [facility] ENDO SUITE;  Service: Endoscopy;  Laterality: N/A;        Home Medications    Prior to Admission medications   Medication Sig Start Date End Date Taking? Authorizing Provider  HYDROcodone-acetaminophen (HYCET) 7.5-325 mg/15 ml solution Take 10 mLs by mouth every 4 (four) hours as needed. 01/30/19 02/04/19 Yes [provider]  acetaminophen (TYLENOL) 325 MG tablet Take 2 tablets (650 mg total) by mouth every 4 (four) hours as needed for headache. 12/12/17   Barton Dubois, MD  Alpha-Lipoic Acid 600 MG CAPS Take 1 capsule (600 mg total) by mouth daily. 09/28/17   Holley Bouche, NP  amLODipine (NORVASC) 10 MG tablet Take 1 tablet by mouth once daily 12/18/18   Lockamy, Randi L, NP-C  B Complex-C (  B-COMPLEX WITH VITAMIN C) tablet Take 1 tablet by mouth daily. 09/28/17   Holley Bouche, NP  dexamethasone (DECADRON) 4 MG tablet Take 8 mg by mouth 2 (two) times daily. 07/21/18   [provider]  gabapentin (NEURONTIN) 300 MG capsule Take 1 capsule by mouth twice daily 11/11/18   Lockamy, Randi L, NP-C  Hyoscyamine Sulfate SL (LEVSIN/SL) 0.125 MG SUBL Place 1 tablet under the tongue every 4 (four) hours as needed. 12/06/18   Derek Jack, MD   lidocaine (XYLOCAINE) 2 % solution Use as directed 15 mLs in the mouth or throat every 3 (three) hours as needed. 01/30/19   [provider]  lidocaine-prilocaine (EMLA) cream Apply 1 application topically as needed. 01/18/18   Derek Jack, MD  loratadine (CLARITIN) 10 MG tablet Take 1 tablet (10 mg total) by mouth daily. 01/30/19   Lockamy, Randi L, NP-C  Misc. Devices MISC Please provide the patient with the [facility] HPB cough Syrup. 1 teaspoonful every 4-6 hours PRN. Please provide the patient with 360 ml. 09/27/18   Lockamy, Randi L, NP-C  Morphine Sulfate (MORPHINE CONCENTRATE) 10 mg / 0.5 ml concentrated solution Take 0.5 mLs (10 mg total) by mouth every 4 (four) hours as needed for severe pain. 12/31/18   Derek Jack, MD  pantoprazole (PROTONIX) 40 MG tablet Take 1 tablet (40 mg total) by mouth 2 (two) times daily. Take 30 minutes before breakfast 05/21/18   Derek Jack, MD  prochlorperazine (COMPAZINE) 10 MG tablet TAKE 1 TABLET BY MOUTH EVERY 6 HOURS AS NEEDED FOR NAUSEA AND VOMITING 01/14/19   Lockamy, Randi L, NP-C  promethazine (PHENERGAN) 25 MG suppository Place 1 suppository (25 mg total) rectally every 6 (six) hours as needed for nausea or vomiting. 02/02/19   Deno Etienne, DO  SSD 1 % cream APPLY CREAM ONCE A DAY AS INSTRUCTED 07/19/18   [provider]  tamsulosin (FLOMAX) 0.4 MG CAPS capsule Take 1 capsule (0.4 mg total) by mouth at bedtime. 01/13/19   Derek Jack, MD    Family History Family History  Problem Relation Age of Onset  . Prostate cancer Father   . Colon cancer Neg Hx   . Colon polyps Neg Hx     Social History Social History   Tobacco Use  . Smoking status: Current Some Day Smoker    Packs/day: 0.25    Types: Cigarettes  . Smokeless tobacco: Never Used  Substance Use Topics  . Alcohol use: No    Comment: hx heavy alcohol, hasn't drank in 19 yrs  . Drug use: Yes    Types: Marijuana     Allergies   Patient has  no known allergies.   Review of Systems Review of Systems  Constitutional: Negative for chills and fever.  HENT: Negative for congestion and facial swelling.   Eyes: Negative for discharge and visual disturbance.  Respiratory: Negative for shortness of breath.   Cardiovascular: Negative for chest pain and palpitations.  Gastrointestinal: Positive for abdominal pain, nausea and vomiting. Negative for diarrhea.  Musculoskeletal: Negative for arthralgias and myalgias.  Skin: Negative for color change and rash.  Neurological: Negative for tremors, syncope and headaches.  Psychiatric/Behavioral: Negative for confusion and dysphoric mood.     Physical Exam Updated Vital Signs BP (!) 140/98   Pulse 93   Temp (P) 97.8 F (36.6 C) (Oral)   Resp (!) 23   Ht 6\' 1"  (1.854 m)   Wt 83.9 kg   SpO2 100%   BMI 24.41  kg/m   Physical Exam Vitals signs and nursing note reviewed.  Constitutional:      Appearance: He is well-developed.  HENT:     Head: Normocephalic and atraumatic.  Eyes:     Pupils: Pupils are equal, round, and reactive to light.  Neck:     Musculoskeletal: Normal range of motion and neck supple.     Vascular: No JVD.  Cardiovascular:     Rate and Rhythm: Normal rate and regular rhythm.     Heart sounds: No murmur. No friction rub. No gallop.   Pulmonary:     Effort: No respiratory distress.     Breath sounds: No wheezing.  Abdominal:     General: There is no distension.     Tenderness: There is abdominal tenderness (mild periumbilical and epigastric). There is no guarding or rebound.  Musculoskeletal: Normal range of motion.  Skin:    Coloration: Skin is not pale.     Findings: No rash.  Neurological:     Mental Status: He is alert and oriented to person, place, and time.  Psychiatric:        Behavior: Behavior normal.      ED Treatments / Results  Labs (all labs ordered are listed, but only abnormal results are displayed) Labs Reviewed  CBC WITH  DIFFERENTIAL/PLATELET - Abnormal; Notable for the following components:      Result Value   MCV 78.0 (*)    RDW 17.4 (*)    All other components within normal limits  COMPREHENSIVE METABOLIC PANEL - Abnormal; Notable for the following components:   Sodium 133 (*)    Potassium 3.0 (*)    Chloride 96 (*)    Glucose, Bld 143 (*)    Creatinine, Ser 1.36 (*)    Total Protein 8.7 (*)    Albumin 5.1 (*)    Total Bilirubin 1.4 (*)    GFR calc non Af Amer 60 (*)    All other components within normal limits  LIPASE, BLOOD  TROPONIN I    EKG EKG Interpretation  Date/Time:  Sunday February 02 2019 07:22:24 EDT Ventricular Rate:  102 PR Interval:    QRS Duration: 96 QT Interval:  312 QTC Calculation: 407 R Axis:   86 Text Interpretation:  Sinus tachycardia Probable left atrial enlargement RSR' in V1 or V2, right VCD or RVH No significant change since last tracing Confirmed by Deno Etienne [phone]) on 02/02/2019 7:54:22 AM   Radiology Dg Chest 2 View  Result Date: 02/02/2019 CLINICAL DATA:  Epigastric pain EXAM: CHEST - 2 VIEW COMPARISON:  12/10/2017 chest radiograph. FINDINGS: Right internal jugular Port-A-Cath terminates in the lower third of the SVC. Lower esophageal stent in place without narrowing. Stable cardiomediastinal silhouette with normal heart size. No pneumothorax. No pleural effusion. Lungs appear clear, with no acute consolidative airspace disease and no pulmonary edema. IMPRESSION: No active cardiopulmonary disease. Lower esophageal stent noted without narrowing. Electronically Signed   By: Ilona Sorrel M.D.   On: 02/02/2019 09:11   Ct Abdomen Pelvis W Contrast  Result Date: 02/02/2019 CLINICAL DATA:  Esophageal stent placed three days ago for esophageal cancer. Patient with pain, nausea, vomiting, sweating and chills after eating. Esophageal cancer diagnosed June 2018. EXAM: CT ABDOMEN AND PELVIS WITH CONTRAST TECHNIQUE: Multidetector CT imaging of the abdomen and pelvis was  performed using the standard protocol following bolus administration of intravenous contrast. CONTRAST:  129mL OMNIPAQUE IOHEXOL 300 MG/ML  SOLN COMPARISON:  12/13/2018 and 08/23/2018 FINDINGS: Lower chest: Lung  bases are normal. Evidence of patient's distal esophageal stent extending into the gastric cardia new since the prior exam. Superior extent of the stent not seen. The visualized portion of the stent appears patent containing fluid and minimal air. Hepatobiliary: Liver and biliary tree are normal. Cholesterol gallstone present. Pancreas: Normal. Spleen: Normal. Adrenals/Urinary Tract: Adrenal glands are normal. Kidneys normal in size without hydronephrosis or nephrolithiasis. Couple stable small left renal cyst. Ureters and bladder are normal. Stomach/Bowel: Interval placement of esophageal stent extending into the gastric cardia which appears patent. The superior extent of this stent is not visualized on this exam although the visualized portion appears patent. There is thickening of the distal esophagus and gastric cardia adjacent the stent compatible with patient's known distal esophageal cancer. Persistent adenopathy the some of which are necrotic in the region of the gastrohepatic ligament, celiac axis and peripancreatic region as some of these nodes may be slightly larger and some less well-defined. Small bowel is normal. Appendix is normal. Mild decompression of the descending and sigmoid colon with subtle wall thickening likely within normal although could be seen with mild acute colitis. Remainder the colon is normal. Vascular/Lymphatic: Vascular structures within normal. Adenopathy as described above. Reproductive: Normal. Other: No significant free fluid. Musculoskeletal: Unremarkable. IMPRESSION: 1. Interval placement of esophageal stent over the distal esophagus extending into the gastric cardia which appears patent. No adjacent free air or focal fluid collection. Note that the superior extent of  the stent is not visualized on this exam. Wall thickening of the distal esophagus and gastric cardia compatible with known esophageal cancer. Stable to slight worsening of adenopathy over the upper abdomen as described. 2. Mild decompression wall thickening of the descending colon and sigmoid colon which may be within normal although could be seen with mild acute colitis. 3.  Mild cholelithiasis. 4.  Several small renal cysts unchanged. Electronically Signed   By: Marin Olp M.D.   On: 02/02/2019 09:44    Procedures Procedures (including critical care time)  Medications Ordered in ED Medications  alum & mag hydroxide-simeth (MAALOX/MYLANTA) 200-200-20 MG/5ML suspension 30 mL (30 mLs Oral Given 02/02/19 0739)  morphine 4 MG/ML injection 4 mg (4 mg Intravenous Given 02/02/19 0739)  ondansetron (ZOFRAN) injection 4 mg (4 mg Intravenous Given 02/02/19 0739)  iohexol (OMNIPAQUE) 300 MG/ML solution 100 mL (100 mLs Intravenous Contrast Given 02/02/19 0846)  sodium chloride 0.9 % bolus 1,000 mL (1,000 mLs Intravenous New Bag/Given 02/02/19 0916)  potassium chloride SA (K-DUR) CR tablet 40 mEq (40 mEq Oral Given 02/02/19 0920)  magnesium oxide (MAG-OX) tablet 800 mg (800 mg Oral Given 02/02/19 0920)     Initial Impression / Assessment and Plan / ED Course  I have reviewed the triage vital signs and the nursing notes.  Pertinent labs & imaging results that were available during my care of the patient were reviewed by me and considered in my medical decision making (see chart for details).        53 yo M with a chief complaints of epigastric abdominal pain.  Has had chronic pain there with his esophageal cancer.  Patient symptoms have worsened over the past 3 days coinciding with esophageal stent placement.  Will obtain a CT scan lab work pain and nausea medicine and reassess.  Lab work without leukocytosis no significant elevation to the LFTs or lipase.  He is mildly hypokalemic.  Renal function is  mildly worse.  Given a bolus of IV fluids.  Patient feeling much better on reassessment.  Tolerating p.o.  CT scan without free air or other concerning pathology.  Patient has signs consistent with his known cancer.  We will have him follow-up with his GI doctor and oncologist in the office.  9:53 AM:  I have discussed the diagnosis/risks/treatment options with the patient and believe the pt to be eligible for discharge home to follow-up with GI, oncology. We also discussed returning to the ED immediately if new or worsening sx occur. We discussed the sx which are most concerning (e.g., sudden worsening pain, fever, inability to tolerate by mouth) that necessitate immediate return. Medications administered to the patient during their visit and any new prescriptions provided to the patient are listed below.  Medications given during this visit Medications  alum & mag hydroxide-simeth (MAALOX/MYLANTA) 200-200-20 MG/5ML suspension 30 mL (30 mLs Oral Given 02/02/19 0739)  morphine 4 MG/ML injection 4 mg (4 mg Intravenous Given 02/02/19 0739)  ondansetron (ZOFRAN) injection 4 mg (4 mg Intravenous Given 02/02/19 0739)  iohexol (OMNIPAQUE) 300 MG/ML solution 100 mL (100 mLs Intravenous Contrast Given 02/02/19 0846)  sodium chloride 0.9 % bolus 1,000 mL (1,000 mLs Intravenous New Bag/Given 02/02/19 0916)  potassium chloride SA (K-DUR) CR tablet 40 mEq (40 mEq Oral Given 02/02/19 0920)  magnesium oxide (MAG-OX) tablet 800 mg (800 mg Oral Given 02/02/19 0920)     The patient appears reasonably screen and/or stabilized for discharge and I doubt any other medical condition or other [facility] requiring further screening, evaluation, or treatment in the ED at this time prior to discharge.    Final Clinical Impressions(s) / ED Diagnoses   Final diagnoses:  Epigastric abdominal pain    ED Discharge Orders         Ordered    promethazine (PHENERGAN) 25 MG suppository  Every 6 hours PRN     02/02/19 [place], [place], DO 02/02/19 [phone]

## 2019-02-02 NOTE — ED Triage Notes (Signed)
Pt reports he had a stent placed Thursday at [facility] for esophageal cancer. Went home and ate some potatoes and began having abd pain. Since has been having pain, nausea/vomiting, sweating, and chills.

## 2019-02-02 NOTE — Discharge Instructions (Signed)
Try zantac or pepcid twice a day.  Try to avoid things that may make this worse, most commonly these are spicy foods tomato based products fatty foods chocolate and peppermint.  Alcohol and tobacco can also make this worse.  Return to the emergency department for sudden worsening pain fever or inability to eat or drink. ° °

## 2019-02-03 ENCOUNTER — Emergency Department (HOSPITAL_COMMUNITY)
Admission: EM | Admit: 2019-02-03 | Discharge: 2019-02-03 | Disposition: A | Discharge status: Other Acute Inpatient Hospital | Payer: Medicaid Other | Attending: Emergency Medicine | Admitting: Emergency Medicine

## 2019-02-03 ENCOUNTER — Other Ambulatory Visit: Payer: Self-pay

## 2019-02-03 ENCOUNTER — Emergency Department (HOSPITAL_COMMUNITY): Payer: Medicaid Other

## 2019-02-03 ENCOUNTER — Encounter (HOSPITAL_COMMUNITY): Payer: Self-pay | Admitting: Emergency Medicine

## 2019-02-03 DIAGNOSIS — C159 Malignant neoplasm of esophagus, unspecified: Secondary | ICD-10-CM | POA: Insufficient documentation

## 2019-02-03 DIAGNOSIS — F17200 Nicotine dependence, unspecified, uncomplicated: Secondary | ICD-10-CM | POA: Diagnosis not present

## 2019-02-03 DIAGNOSIS — R1013 Epigastric pain: Secondary | ICD-10-CM | POA: Diagnosis not present

## 2019-02-03 DIAGNOSIS — Z20828 Contact with and (suspected) exposure to other viral communicable diseases: Secondary | ICD-10-CM | POA: Diagnosis not present

## 2019-02-03 DIAGNOSIS — I10 Essential (primary) hypertension: Secondary | ICD-10-CM | POA: Diagnosis not present

## 2019-02-03 DIAGNOSIS — R112 Nausea with vomiting, unspecified: Secondary | ICD-10-CM | POA: Insufficient documentation

## 2019-02-03 LAB — COMPREHENSIVE METABOLIC PANEL
ALT: 14 U/L (ref 0–44)
AST: 17 U/L (ref 15–41)
Albumin: 4.7 g/dL (ref 3.5–5.0)
Alkaline Phosphatase: 54 U/L (ref 38–126)
Anion gap: 13 (ref 5–15)
BUN: 16 mg/dL (ref 6–20)
CO2: 25 mmol/L (ref 22–32)
Calcium: 10.1 mg/dL (ref 8.9–10.3)
Chloride: 99 mmol/L (ref 98–111)
Creatinine, Ser: 1.09 mg/dL (ref 0.61–1.24)
GFR calc Af Amer: >60 mL/min (ref >60)
GFR calc non Af Amer: >60 mL/min (ref >60)
Glucose, Bld: 132 mg/dL — ABNORMAL HIGH (ref 70–99)
Potassium: 4.3 mmol/L (ref 3.5–5.1)
Sodium: 137 mmol/L (ref 135–145)
Total Bilirubin: 1.1 mg/dL (ref 0.3–1.2)
Total Protein: 8.1 g/dL (ref 6.5–8.1)

## 2019-02-03 LAB — LIPASE, BLOOD: Lipase: 29 U/L (ref 11–51)

## 2019-02-03 LAB — CBC
HCT: 39.8 % (ref 39.0–52.0)
Hemoglobin: 13.8 g/dL (ref 13.0–17.0)
MCH: 27.1 pg (ref 26.0–34.0)
MCHC: 34.7 g/dL (ref 30.0–36.0)
MCV: 78 fL — ABNORMAL LOW (ref 80.0–100.0)
Platelets: 240 10*3/uL (ref 150–400)
RBC: 5.1 MIL/uL (ref 4.22–5.81)
RDW: 16.9 % — ABNORMAL HIGH (ref 11.5–15.5)
WBC: 5.9 10*3/uL (ref 4.0–10.5)
nRBC: 0 % (ref 0.0–0.2)

## 2019-02-03 LAB — SARS CORONAVIRUS 2 BY RT PCR (HOSPITAL ORDER, PERFORMED IN ~~LOC~~ HOSPITAL LAB): SARS Coronavirus 2: NEGATIVE

## 2019-02-03 MED ORDER — HYDROMORPHONE HCL 1 MG/ML IJ SOLN
0.5000 mg | Freq: Once | INTRAMUSCULAR | Status: AC
  Administered 2019-02-03: 21:00:00 0.5 mg via INTRAVENOUS
  Filled 2019-02-03: qty 1

## 2019-02-03 MED ORDER — ONDANSETRON HCL 4 MG/2ML IJ SOLN
4.0000 mg | Freq: Once | INTRAMUSCULAR | Status: AC
  Administered 2019-02-03: 11:00:00 4 mg via INTRAVENOUS
  Filled 2019-02-03: qty 2

## 2019-02-03 MED ORDER — ONDANSETRON HCL 4 MG/2ML IJ SOLN
4.0000 mg | Freq: Once | INTRAMUSCULAR | Status: AC
  Administered 2019-02-03: 15:00:00 4 mg via INTRAVENOUS
  Filled 2019-02-03: qty 2

## 2019-02-03 MED ORDER — HYDROMORPHONE HCL 1 MG/ML IJ SOLN
0.5000 mg | Freq: Once | INTRAMUSCULAR | Status: AC
  Administered 2019-02-03: 11:00:00 0.5 mg via INTRAVENOUS
  Filled 2019-02-03: qty 1

## 2019-02-03 MED ORDER — FAMOTIDINE IN NACL 20-0.9 MG/50ML-% IV SOLN
20.0000 mg | Freq: Once | INTRAVENOUS | Status: AC
  Administered 2019-02-03: 11:00:00 20 mg via INTRAVENOUS
  Filled 2019-02-03: qty 50

## 2019-02-03 MED ORDER — HYDROMORPHONE HCL 1 MG/ML IJ SOLN
0.5000 mg | Freq: Once | INTRAMUSCULAR | Status: AC
  Administered 2019-02-03: 0.5 mg via INTRAVENOUS
  Filled 2019-02-03: qty 1

## 2019-02-03 MED ORDER — ONDANSETRON HCL 4 MG/2ML IJ SOLN
4.0000 mg | Freq: Once | INTRAMUSCULAR | Status: AC
  Administered 2019-02-03: 21:00:00 4 mg via INTRAVENOUS
  Filled 2019-02-03: qty 2

## 2019-02-03 MED ORDER — SODIUM CHLORIDE 0.9 % IV BOLUS
1000.0000 mL | Freq: Once | INTRAVENOUS | Status: AC
  Administered 2019-02-03: 1000 mL via INTRAVENOUS

## 2019-02-03 NOTE — ED Triage Notes (Signed)
Pt reports he continues to have abd pain from recent esophageal stent. Has been unable to keep anything down since Thursday. Spoke to Dr. Shaune Leeks nurse this morning and was advised to come back for eval and poss admission.

## 2019-02-03 NOTE — ED Provider Notes (Addendum)
[facility] Emergency Department Provider Note MRN:  [id number]  Arrival date & time: 02/03/19     Chief Complaint   Nausea   History of Present Illness   Maxwell Henderson is a 53 y.o. year-old male with a history of hypertension, esophageal cancer presenting to the ED with chief complaint of nausea.  Patient explains that he had an esophageal stent placed about a week ago over at [facility].  Since that time he has had continued great difficulty eating or drinking.  No appetite, vomits everything that he tries to eat or drink.  Has not had a bowel movement for several days but does not feel the urge to do so.  Feels empty.  Denies fever but endorses intermittent diaphoresis.  Denies chest pain or shortness of breath, endorsing epigastric abdominal pain, which is chronic.  Denies lower abdominal pain.  Review of Systems  A complete 10 system review of systems was obtained and all systems are negative except as noted in the HPI and PMH.   Patient's Health History    Past Medical History:  Diagnosis Date  . Hypertension   . 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 ESOPHAGOGASTRODUODENOSCOPY (EGD) WITH PROPOFOL N/A 01/16/2017   Procedure: ESOPHAGOGASTRODUODENOSCOPY (EGD) WITH PROPOFOL;  Surgeon: [name], MD;  Location[facility] ENDO [facility];  Service: Endoscopy;  Laterality: N/A;  730   . IR FLUORO GUIDE PORT INSERTION RIGHT  02/13/2017  . IR US GUIDE VASC ACCESS RIGHT  02/13/2017  . lipoma removal    . PORTA CATH INSERTION Right 02/13/2017  . SAVORY DILATION N/A 01/16/2017   Procedure: SAVORY DILATION;  Surgeon: [name], MD;  Location: [facility] ENDO SUITE;  Service: Endoscopy;  Laterality: N/A;    Family History  Problem Relation Age of Onset  . Prostate cancer Father   . Colon cancer Neg Hx   . Colon polyps Neg Hx     Social History    Socioeconomic History  . Marital status: Married    Spouse name: Not on file  . Number of children: Not on file  . Years of education: Not on file  . Highest education level: Not on file  Occupational History  . Not on file  Social Needs  . Financial resource strain: Not on file  . Food insecurity    Worry: Not on file    Inability: Not on file  . Transportation needs    Medical: Not on file    Non-medical: Not on file  Tobacco Use  . Smoking status: Current Some Day Smoker    Packs/day: 0.25    Types: Cigarettes  . Smokeless tobacco: Never Used  Substance and Sexual Activity  . Alcohol use: No    Comment: hx heavy alcohol, hasn't drank in 19 yrs  . Drug use: Yes    Types: Marijuana  . Sexual activity: Yes  Lifestyle  . Physical activity    Days per week: Not on file    Minutes per session: Not on file  . Stress: Not on file  Relationships  . Social Herbalist on phone: Not on file    Gets together: Not on file    Attends religious service: Not on file    Active member of club or organization: Not on file    Attends meetings of clubs or organizations:  Not on file    Relationship status: Not on file  . Intimate partner violence    Fear of current or ex partner: Not on file    Emotionally abused: Not on file    Physically abused: Not on file    Forced sexual activity: Not on file  Other Topics Concern  . Not on file  Social History Narrative   WORKING [employer].     Physical Exam  Vital Signs and Nursing Notes reviewed Vitals:   02/03/19 1027  BP: (!) 137/96  Pulse: 94  Resp: 18  Temp: 99.1 F (37.3 C)  SpO2: 100%    CONSTITUTIONAL: Well-appearing, NAD NEURO:  Alert and oriented x 3, no focal deficits EYES:  eyes equal and reactive ENT/NECK:  no LAD, no JVD CARDIO: Regular rate, well-perfused, normal S1 and S2 PULM:  CTAB no wheezing or rhonchi GI/GU:  normal bowel sounds, non-distended, non-tender MSK/SPINE:  No  gross deformities, no edema SKIN:  no rash, atraumatic PSYCH:  Appropriate speech and behavior  Diagnostic and Interventional Summary    Labs Reviewed  CBC - Abnormal; Notable for the following components:      Result Value   MCV 78.0 (*)    RDW 16.9 (*)    All other components within normal limits  COMPREHENSIVE METABOLIC PANEL  LIPASE, BLOOD    DG ABD ACUTE 2+V W 1V CHEST    (Results Pending)    Medications  famotidine (PEPCID) IVPB 20 mg premix (20 mg Intravenous New Bag/Given 02/03/19 1105)  sodium chloride 0.9 % bolus 1,000 mL (1,000 mLs Intravenous New Bag/Given 02/03/19 1105)  ondansetron (ZOFRAN) injection 4 mg (4 mg Intravenous Given 02/03/19 1106)  HYDROmorphone (DILAUDID) injection 0.5 mg (0.5 mg Intravenous Given 02/03/19 1106)     Procedures Critical Care  ED Course and Medical Decision Making  I have reviewed the triage vital signs and the nursing notes.  Pertinent labs & imaging results that were available during my care of the patient were reviewed by me and considered in my medical decision making (see below for details).  Patient's esophageal stent, which was recently placed, does not seem to be ameliorating his symptoms related to esophageal cancer.  Normal vital signs here, abdomen mildly tender but otherwise fairly reassuring.  Had a CT yesterday which was unrevealing.  Will attempt symptomatic management and obtain screening labs and x-ray.  11:30 AM update: Received a call from Dr. Truddie Crumble, patient's surgeon.  Dr. Truddie Crumble requesting transfer to [facility] for removal of stent and likely placement of PEG tube.  Will call [facility] transfer center to formalize this transfer, Dr. Truddie Crumble accepting.  11:45 AM update: Informed by [facility] transfer center that patient would not be able to be transported until tomorrow morning due to lack of capacity.  Discussed options with patient, who has improved symptoms but still feels unwell and explains that his symptoms  will return when this medicine wears off.  Will attempt admission to hospitalist here at [facility], who can then facilitate transfer to [facility] for his surgical procedures, which would not be performed until Wednesday.  2:20 PM update: Case discussed again with [facility] transfer center.  Now that they are aware the patient is receiving chemo every other week, he qualifies for the heme-onc service, which has beds available.  We will change the plan and have the patient transferred to [facility] for admission.  Accepting physician Dr. Crecencio Mc.  Dr. Posey Pronto of the [facility] hospitalist team made aware.  Barth Kirks. [place] Small, [place] [email or web address]  Final Clinical Impressions(s) / ED Diagnoses     ICD-10-CM   1. Non-intractable vomiting with nausea, unspecified vomiting type  R11.2   2. Epigastric pain  R10.13 DG ABD ACUTE 2+V W 1V CHEST    DG ABD ACUTE 2+V W 1V CHEST    ED Discharge Orders    None         Maudie Flakes, MD 02/03/19 1131    Maudie Flakes, MD 02/03/19 1211    Maudie Flakes, MD 02/03/19 1435

## 2019-02-03 NOTE — Care Plan (Addendum)
[facility] PROGRESS NOTE  Patient: Maxwell Henderson JGM:[id number]   PCP: [name], FNP DOB: 21-Jan-1966   DOA: 02/03/2019   DOS: 02/03/2019    Patient was initially accepted for admission while he is waiting for bed availability at [facility].  Patient is on active chemotherapy at [facility] and recently had esophageal stent placement done at [facility] and now presenting with persistent symptoms with intractable nausea and vomiting without any improvement with recurrent dehydration. Patient was discussed with Dr. Allene Dillon on the phone and temporary admission orders were placed. After that discussion, Mina Marble had a bed available for the patient and the patient was removed from admission que and will be transferred to [facility]. Patient was not physically seen or evaluated. This is a no charge note.   Author: Berle Mull, MD [facility] 02/03/2019 2:44 PM   If 7PM-7AM, please contact night-coverage at [email or web address]

## 2019-02-04 ENCOUNTER — Ambulatory Visit (HOSPITAL_COMMUNITY): Payer: Medicaid Other | Admitting: Hematology

## 2019-02-04 ENCOUNTER — Other Ambulatory Visit (HOSPITAL_COMMUNITY): Payer: Medicaid Other

## 2019-02-04 ENCOUNTER — Ambulatory Visit (HOSPITAL_COMMUNITY): Payer: Medicaid Other

## 2019-02-06 ENCOUNTER — Encounter (HOSPITAL_COMMUNITY): Payer: Medicaid Other

## 2019-02-07 ENCOUNTER — Other Ambulatory Visit: Payer: Self-pay

## 2019-02-07 ENCOUNTER — Emergency Department (HOSPITAL_COMMUNITY)
Admission: EM | Admit: 2019-02-07 | Discharge: 2019-02-07 | Disposition: A | Discharge status: Nursing Home (Includes ICF) | Payer: Medicaid Other | Attending: Emergency Medicine | Admitting: Emergency Medicine

## 2019-02-07 ENCOUNTER — Encounter (HOSPITAL_COMMUNITY): Payer: Self-pay

## 2019-02-07 DIAGNOSIS — I10 Essential (primary) hypertension: Secondary | ICD-10-CM | POA: Insufficient documentation

## 2019-02-07 DIAGNOSIS — Z79899 Other long term (current) drug therapy: Secondary | ICD-10-CM | POA: Diagnosis not present

## 2019-02-07 DIAGNOSIS — F17210 Nicotine dependence, cigarettes, uncomplicated: Secondary | ICD-10-CM | POA: Diagnosis not present

## 2019-02-07 DIAGNOSIS — K922 Gastrointestinal hemorrhage, unspecified: Secondary | ICD-10-CM | POA: Diagnosis not present

## 2019-02-07 DIAGNOSIS — E876 Hypokalemia: Secondary | ICD-10-CM

## 2019-02-07 DIAGNOSIS — E119 Type 2 diabetes mellitus without complications: Secondary | ICD-10-CM | POA: Insufficient documentation

## 2019-02-07 DIAGNOSIS — R1013 Epigastric pain: Secondary | ICD-10-CM | POA: Diagnosis present

## 2019-02-07 LAB — CBC WITH DIFFERENTIAL/PLATELET
Abs Immature Granulocytes: 0.02 10*3/uL (ref 0.00–0.07)
Basophils Absolute: 0 10*3/uL (ref 0.0–0.1)
Basophils Relative: 0 %
Eosinophils Absolute: 0 10*3/uL (ref 0.0–0.5)
Eosinophils Relative: 0 %
HCT: 37.8 % — ABNORMAL LOW (ref 39.0–52.0)
Hemoglobin: 12.8 g/dL — ABNORMAL LOW (ref 13.0–17.0)
Immature Granulocytes: 0 %
Lymphocytes Relative: 21 %
Lymphs Abs: 1.6 10*3/uL (ref 0.7–4.0)
MCH: 26.6 pg (ref 26.0–34.0)
MCHC: 33.9 g/dL (ref 30.0–36.0)
MCV: 78.6 fL — ABNORMAL LOW (ref 80.0–100.0)
Monocytes Absolute: 0.7 10*3/uL (ref 0.1–1.0)
Monocytes Relative: 9 %
Neutro Abs: 5.3 10*3/uL (ref 1.7–7.7)
Neutrophils Relative %: 70 %
Platelets: 261 10*3/uL (ref 150–400)
RBC: 4.81 MIL/uL (ref 4.22–5.81)
RDW: 16.2 % — ABNORMAL HIGH (ref 11.5–15.5)
WBC: 7.6 10*3/uL (ref 4.0–10.5)
nRBC: 0 % (ref 0.0–0.2)

## 2019-02-07 LAB — COMPREHENSIVE METABOLIC PANEL
ALT: 15 U/L (ref 0–44)
AST: 14 U/L — ABNORMAL LOW (ref 15–41)
Albumin: 4.2 g/dL (ref 3.5–5.0)
Alkaline Phosphatase: 62 U/L (ref 38–126)
Anion gap: 12 (ref 5–15)
BUN: 16 mg/dL (ref 6–20)
CO2: 25 mmol/L (ref 22–32)
Calcium: 9.4 mg/dL (ref 8.9–10.3)
Chloride: 100 mmol/L (ref 98–111)
Creatinine, Ser: 1.02 mg/dL (ref 0.61–1.24)
GFR calc Af Amer: >60 mL/min (ref >60)
GFR calc non Af Amer: >60 mL/min (ref >60)
Glucose, Bld: 135 mg/dL — ABNORMAL HIGH (ref 70–99)
Potassium: 3.1 mmol/L — ABNORMAL LOW (ref 3.5–5.1)
Sodium: 137 mmol/L (ref 135–145)
Total Bilirubin: 1 mg/dL (ref 0.3–1.2)
Total Protein: 7.3 g/dL (ref 6.5–8.1)

## 2019-02-07 LAB — LIPASE, BLOOD: Lipase: 36 U/L (ref 11–51)

## 2019-02-07 MED ORDER — ONDANSETRON HCL 4 MG/2ML IJ SOLN: 4.0000 mg | Freq: Four times a day (QID) | INTRAMUSCULAR | Status: DC | PRN

## 2019-02-07 MED ORDER — PANTOPRAZOLE SODIUM 40 MG IV SOLR
40.0000 mg | Freq: Two times a day (BID) | INTRAVENOUS | Status: DC
  Administered 2019-02-07: 40 mg via INTRAVENOUS
  Filled 2019-02-07: qty 40

## 2019-02-07 MED ORDER — SODIUM CHLORIDE 0.9 % IV BOLUS
1000.0000 mL | Freq: Once | INTRAVENOUS | Status: AC
  Administered 2019-02-07: 1000 mL via INTRAVENOUS

## 2019-02-07 MED ORDER — ONDANSETRON HCL 4 MG/2ML IJ SOLN
4.0000 mg | Freq: Once | INTRAMUSCULAR | Status: AC
  Administered 2019-02-07: 4 mg via INTRAVENOUS
  Filled 2019-02-07: qty 2

## 2019-02-07 MED ORDER — POTASSIUM CHLORIDE 20 MEQ/15ML (10%) PO SOLN
40.0000 meq | Freq: Once | ORAL | Status: AC
  Administered 2019-02-07: 40 meq via ORAL
  Filled 2019-02-07: qty 30

## 2019-02-07 MED ORDER — HYDROMORPHONE HCL 1 MG/ML IJ SOLN
1.0000 mg | Freq: Once | INTRAMUSCULAR | Status: AC
  Administered 2019-02-07: 1 mg via INTRAVENOUS
  Filled 2019-02-07: qty 1

## 2019-02-07 NOTE — ED Provider Notes (Signed)
Medical screening examination/treatment/procedure(s) were conducted as a shared visit with non-physician practitioner(s) and myself.  I personally evaluated the patient during the encounter.  Clinical Impression:   Final diagnoses:  Gastrointestinal hemorrhage, unspecified gastrointestinal hemorrhage type  Hypokalemia    The patient is a 53 year old male, history of esophageal cancer status post esophageal stenting, released from the hospital yesterday however presents today with increased amounts of epigastric and lower chest discomfort followed by hematemesis.  He states he has ongoing mild mid chest pain which she has had for months and attributes to acid reflux.  He has not been able to eat or drink very much in the last 2 weeks and this morning was feeling so terrible he had come to the hospital.  On exam the patient at this time is not tachycardic and appears comfortable with normal vital signs.  Discussion with physician assistant and [facility] gastroenterology service, they recommend sending the patient to [facility] which I agree with at this time since he has high risk for recurrent bleeding and will need a gastro-feeding tube.  Patient agreeable   Noemi Chapel, MD 02/08/19 [phone]

## 2019-02-07 NOTE — ED Provider Notes (Signed)
[facility] EMERGENCY DEPARTMENT Provider Note   CSN: [id number] Arrival date & time: 02/07/19  9562    History   Chief Complaint No chief complaint on file.   HPI Maxwell Henderson is a 53 y.o. male.     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  Patient states that he was discharged from [facility] yesterday, upon arriving home had 2 episodes of vomiting, the first episode was described as bright red blood followed by second episode of a darker colored blood.  Patient states that he was up all night sweating and feeling unwell, woke up this morning and had 3 more vomiting episodes, states the first episode he filled the commode with bright red blood followed by a second bloody emesis and third emesis which he describes as all water.  Patient was planning on driving himself to back to [facility] however when EMS was assisting him to his car he began to feel very lightheaded, dizzy, weak and decided to come via EMS to the ER at that time.  Patient reports epigastric abdominal pain which is chronic in nature, states that he is belching and this smells like feces.  Patient reports last bowel movement was 2 weeks ago, had a soapsuds enema at [facility] 2 days ago which produced very little stool output.  Patient has been taking stool softeners without relief.  Patient receives chemo every other week, has not had chemo in 3 weeks due to recent hospitalizations.  Patient is scheduled to have a feeding tube placed on Monday.     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    Patient Active Problem List   Diagnosis Date Noted   Esophageal cancer ([place]) 02/03/2019   Goals of care, counseling/discussion 11/28/2018   Type 2 diabetes  mellitus without complication, without long-term current use of insulin ([place])    Essential hypertension    SIRS (systemic inflammatory response syndrome) ([place]) 12/11/2017   HCAP (healthcare-associated pneumonia) 12/11/2017   Infection due to parainfluenza virus 3 12/11/2017   PNA (pneumonia) 12/10/2017   Hypokalemia 12/10/2017   Iron deficiency anemia due to chronic blood loss 02/28/2017   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   Dysphagia 01/05/2017    Past Surgical History:  Procedure Laterality Date   ESOPHAGOGASTRODUODENOSCOPY (EGD) WITH PROPOFOL N/A 01/16/2017   Procedure: ESOPHAGOGASTRODUODENOSCOPY (EGD) WITH PROPOFOL;  Surgeon: [name], MD;  Location: [facility] ENDO [facility];  Service: Endoscopy;  Laterality: N/A;  730    IR FLUORO GUIDE PORT INSERTION RIGHT  02/13/2017   IR US GUIDE VASC ACCESS RIGHT  02/13/2017   lipoma removal     PORTA CATH INSERTION Right 02/13/2017   SAVORY DILATION N/A 01/16/2017   Procedure: SAVORY DILATION;  Surgeon: [name], MD;  Location: [facility] ENDO SUITE;  Service: Endoscopy;  Laterality: N/A;        Home Medications    Prior to Admission medications   Medication Sig Start Date End Date Taking? Authorizing Provider  acetaminophen (TYLENOL) 325 MG tablet Take 2 tablets (650 mg total) by mouth every 4 (four) hours as needed for headache. Patient not taking: Reported on 02/03/2019 12/12/17   Barton Dubois, MD  Alpha-Lipoic Acid 600 MG CAPS Take 1 capsule (600 mg total) by mouth daily. Patient not  taking: Reported on 02/03/2019 09/28/17   Holley Bouche, NP  amLODipine (NORVASC) 10 MG tablet Take 1 tablet by mouth once daily 12/18/18   Lockamy, Randi L, NP-C  B Complex-C (B-COMPLEX WITH VITAMIN C) tablet Take 1 tablet by mouth daily. Patient not taking: Reported on 02/03/2019 09/28/17   Holley Bouche, NP  dexamethasone (DECADRON) 4 MG tablet Take 8 mg by mouth 2 (two) times daily. 07/21/18   [provider]    gabapentin (NEURONTIN) 300 MG capsule Take 1 capsule by mouth twice daily Patient not taking: Reported on 02/03/2019 11/11/18   Francene Finders L, NP-C  Hyoscyamine Sulfate SL (LEVSIN/SL) 0.125 MG SUBL Place 1 tablet under the tongue every 4 (four) hours as needed. 12/06/18   Derek Jack, MD  lidocaine (XYLOCAINE) 2 % solution Use as directed 15 mLs in the mouth or throat every 3 (three) hours as needed. 01/30/19   [provider]  lidocaine-prilocaine (EMLA) cream Apply 1 application topically as needed. Patient not taking: Reported on 02/03/2019 01/18/18   Derek Jack, MD  loratadine (CLARITIN) 10 MG tablet Take 1 tablet (10 mg total) by mouth daily. 01/30/19   Lockamy, Randi L, NP-C  Misc. Devices MISC Please provide the patient with the [facility] HPB cough Syrup. 1 teaspoonful every 4-6 hours PRN. Please provide the patient with 360 ml. Patient not taking: Reported on 02/03/2019 09/27/18   Francene Finders L, NP-C  Morphine Sulfate (MORPHINE CONCENTRATE) 10 mg / 0.5 ml concentrated solution Take 0.5 mLs (10 mg total) by mouth every 4 (four) hours as needed for severe pain. 12/31/18   Derek Jack, MD  pantoprazole (PROTONIX) 40 MG tablet Take 1 tablet (40 mg total) by mouth 2 (two) times daily. Take 30 minutes before breakfast 05/21/18   Derek Jack, MD  prochlorperazine (COMPAZINE) 10 MG tablet TAKE 1 TABLET BY MOUTH EVERY 6 HOURS AS NEEDED FOR NAUSEA AND VOMITING 01/14/19   Lockamy, Randi L, NP-C  promethazine (PHENERGAN) 25 MG suppository Place 1 suppository (25 mg total) rectally every 6 (six) hours as needed for nausea or vomiting. 02/02/19   Deno Etienne, DO  SSD 1 % cream APPLY CREAM ONCE A DAY AS INSTRUCTED 07/19/18   [provider]  tamsulosin (FLOMAX) 0.4 MG CAPS capsule Take 1 capsule (0.4 mg total) by mouth at bedtime. Patient not taking: Reported on 02/03/2019 01/13/19   Derek Jack, MD    Family History Family History  Problem Relation  Age of Onset   Prostate cancer Father    Colon cancer Neg Hx    Colon polyps Neg Hx     Social History Social History   Tobacco Use   Smoking status: Current Some Day Smoker    Packs/day: 0.25    Types: Cigarettes   Smokeless tobacco: Never Used  Substance Use Topics   Alcohol use: No    Comment: hx heavy alcohol, hasn't drank in 22 yrs   Drug use: Yes    Types: Marijuana     Allergies   Patient has no known allergies.   Review of Systems Review of Systems  Constitutional: Positive for appetite change, diaphoresis and unexpected weight change. Negative for fever.  Respiratory: Negative for shortness of breath.   Cardiovascular: Negative for chest pain.  Gastrointestinal: Positive for abdominal pain, constipation, nausea and vomiting. Negative for abdominal distention and blood in stool.  Genitourinary: Negative for difficulty urinating.  Skin: Negative for rash and wound.  Allergic/Immunologic: Positive for immunocompromised state.  Neurological: Positive  for dizziness and weakness.  Psychiatric/Behavioral: Negative for confusion.  All other systems reviewed and are negative.    Physical Exam Updated Vital Signs BP (!) 145/99 (BP Location: Left Arm)    Pulse 85    Resp 19    Ht 6\' 1"  (1.854 m)    Wt 76.7 kg    SpO2 100%    BMI 22.30 kg/m   Physical Exam Vitals signs and nursing note reviewed.  Constitutional:      General: He is not in acute distress.    Appearance: He is well-developed. He is not diaphoretic.  HENT:     Head: Normocephalic and atraumatic.     Mouth/Throat:     Mouth: Mucous membranes are moist.  Cardiovascular:     Rate and Rhythm: Regular rhythm. Tachycardia present.     Pulses: Normal pulses.     Heart sounds: Normal heart sounds.  Pulmonary:     Effort: Pulmonary effort is normal.     Breath sounds: Normal breath sounds.  Abdominal:     General: There is no distension.     Tenderness: There is no abdominal tenderness.    Musculoskeletal:     Right lower leg: No edema.     Left lower leg: No edema.  Skin:    General: Skin is warm and dry.     Findings: No erythema or rash.  Neurological:     Mental Status: He is alert and oriented to person, place, and time.  Psychiatric:        Behavior: Behavior normal.      ED Treatments / Results  Labs (all labs ordered are listed, but only abnormal results are displayed) Labs Reviewed  CBC WITH DIFFERENTIAL/PLATELET - Abnormal; Notable for the following components:      Result Value   Hemoglobin 12.8 (*)    HCT 37.8 (*)    MCV 78.6 (*)    RDW 16.2 (*)    All other components within normal limits  COMPREHENSIVE METABOLIC PANEL - Abnormal; Notable for the following components:   Potassium 3.1 (*)    Glucose, Bld 135 (*)    AST 14 (*)    All other components within normal limits  LIPASE, BLOOD  URINALYSIS, ROUTINE W REFLEX MICROSCOPIC    EKG EKG Interpretation  Date/Time:  Friday February 07 2019 09:10:54 EDT Ventricular Rate:  116 PR Interval:    QRS Duration: 97 QT Interval:  309 QTC Calculation: 430 R Axis:   85 Text Interpretation:  Sinus tachycardia Probable left atrial enlargement RSR' in V1 or V2, right VCD or RVH since last tracing no significant change Confirmed by Noemi Chapel [phone]) on 02/07/2019 9:18:27 AM   Radiology No results found.  Procedures Procedures (including critical care time)  Medications Ordered in ED Medications  pantoprazole (PROTONIX) injection 40 mg (has no administration in time range)  potassium chloride 20 MEQ/15ML (10%) solution 40 mEq (has no administration in time range)  ondansetron (ZOFRAN) injection 4 mg (has no administration in time range)  sodium chloride 0.9 % bolus 1,000 mL (0 mLs Intravenous Stopped 02/07/19 1105)  ondansetron (ZOFRAN) injection 4 mg (4 mg Intravenous Given 02/07/19 0944)  HYDROmorphone (DILAUDID) injection 1 mg (1 mg Intravenous Given 02/07/19 0946)     Initial Impression /  Assessment and Plan / ED Course  I have reviewed the triage vital signs and the nursing notes.  Pertinent labs & imaging results that were available during my care of the patient were reviewed  by me and considered in my medical decision making (see chart for details).  Clinical Course as of Feb 06 1109  Fri Jun 26, 139  [id number] 53 year old male with history of esophageal adenocarcinoma brought in by EMS for hematemesis today.  Patient has a stent in his esophagus, discharged from [facility] yesterday with plan to return on Monday for PEG tube.  Patient reports vomiting with blood onset yesterday upon arriving home, continuing into today.  Patient had hoped to get to his GI, Dr. Truddie Crumble, at [facility] today however began to feel weak and dizzy was brought to this emergency room by EMS.  On arrival patient was hypertensive and tachycardic, after IV fluids his blood pressure has normalized and heart rate improved to the low 90s.  There is been no further vomiting.  H&H without significant change (currently 12.8/37.8), CMP with mild hypokalemia with potassium of 3.1 which was repleted with liquid potassium after GI consult.  Case discussed with Dr. Truddie Crumble, patient's GI specialist who requests transfer to [facility], recommends twice daily PPI therapy, clear liquid diet, no blood thinners.  Patient will be admitted to hematology oncology, accepting physician is Dr. Theodosia Paling.  Patient and wife updated on plan of care.   [LM]    Clinical Course User Index [LM] Tacy Learn, PA-C      Final Clinical Impressions(s) / ED Diagnoses   Final diagnoses:  Gastrointestinal hemorrhage, unspecified gastrointestinal hemorrhage type  Hypokalemia    ED Discharge Orders    None       Roque Lias 02/07/19 1251    Noemi Chapel, MD 02/08/19 [phone]

## 2019-02-07 NOTE — ED Triage Notes (Signed)
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  Was discharged from [facility] yesterday and has been vomiting since coming home.  Reports had esophageal stent placed last Thursday.  Reports noticed blood in emesis.  Pt is scheduled for feeding tube placement Monday at [facility].  C/O dizziness and fatigue.  Pt says has heart burn and feels like a "block" in esophagus.

## 2019-02-11 ENCOUNTER — Ambulatory Visit (HOSPITAL_COMMUNITY): Payer: Medicaid Other

## 2019-02-11 ENCOUNTER — Ambulatory Visit (HOSPITAL_COMMUNITY): Payer: Medicaid Other | Admitting: Hematology

## 2019-02-11 ENCOUNTER — Other Ambulatory Visit (HOSPITAL_COMMUNITY): Payer: Medicaid Other

## 2019-02-13 ENCOUNTER — Encounter (HOSPITAL_COMMUNITY): Payer: Medicaid Other

## 2019-02-17 ENCOUNTER — Encounter (HOSPITAL_COMMUNITY): Payer: Self-pay | Admitting: Hematology

## 2019-02-17 ENCOUNTER — Ambulatory Visit (HOSPITAL_COMMUNITY): Payer: Medicaid Other

## 2019-02-17 ENCOUNTER — Inpatient Hospital Stay (HOSPITAL_COMMUNITY): Payer: Medicaid Other | Attending: Hematology

## 2019-02-17 ENCOUNTER — Inpatient Hospital Stay (HOSPITAL_BASED_OUTPATIENT_CLINIC_OR_DEPARTMENT_OTHER): Payer: Medicaid Other | Admitting: Hematology

## 2019-02-17 ENCOUNTER — Other Ambulatory Visit (HOSPITAL_COMMUNITY): Payer: Self-pay | Admitting: Hematology

## 2019-02-17 ENCOUNTER — Other Ambulatory Visit (HOSPITAL_COMMUNITY): Payer: Medicaid Other

## 2019-02-17 ENCOUNTER — Ambulatory Visit (HOSPITAL_COMMUNITY): Payer: Medicaid Other | Admitting: Hematology

## 2019-02-17 ENCOUNTER — Inpatient Hospital Stay (HOSPITAL_COMMUNITY): Payer: Medicaid Other

## 2019-02-17 ENCOUNTER — Other Ambulatory Visit: Payer: Self-pay

## 2019-02-17 VITALS — BP 137/95 | HR 125 | Temp 97.5°F | Resp 18 | Wt 171.0 lb

## 2019-02-17 VITALS — BP 136/88 | HR 94 | Temp 98.1°F | Resp 18

## 2019-02-17 DIAGNOSIS — R911 Solitary pulmonary nodule: Secondary | ICD-10-CM

## 2019-02-17 DIAGNOSIS — R109 Unspecified abdominal pain: Secondary | ICD-10-CM | POA: Diagnosis not present

## 2019-02-17 DIAGNOSIS — C159 Malignant neoplasm of esophagus, unspecified: Secondary | ICD-10-CM

## 2019-02-17 DIAGNOSIS — Z5111 Encounter for antineoplastic chemotherapy: Secondary | ICD-10-CM | POA: Insufficient documentation

## 2019-02-17 DIAGNOSIS — C155 Malignant neoplasm of lower third of esophagus: Secondary | ICD-10-CM | POA: Diagnosis present

## 2019-02-17 DIAGNOSIS — C158 Malignant neoplasm of overlapping sites of esophagus: Secondary | ICD-10-CM

## 2019-02-17 DIAGNOSIS — Z931 Gastrostomy status: Secondary | ICD-10-CM

## 2019-02-17 DIAGNOSIS — Z720 Tobacco use: Secondary | ICD-10-CM

## 2019-02-17 DIAGNOSIS — R634 Abnormal weight loss: Secondary | ICD-10-CM

## 2019-02-17 DIAGNOSIS — K5900 Constipation, unspecified: Secondary | ICD-10-CM

## 2019-02-17 DIAGNOSIS — C770 Secondary and unspecified malignant neoplasm of lymph nodes of head, face and neck: Secondary | ICD-10-CM | POA: Insufficient documentation

## 2019-02-17 DIAGNOSIS — R066 Hiccough: Secondary | ICD-10-CM

## 2019-02-17 DIAGNOSIS — Z955 Presence of coronary angioplasty implant and graft: Secondary | ICD-10-CM

## 2019-02-17 DIAGNOSIS — Z452 Encounter for adjustment and management of vascular access device: Secondary | ICD-10-CM | POA: Diagnosis not present

## 2019-02-17 LAB — CBC WITH DIFFERENTIAL/PLATELET
Abs Immature Granulocytes: 0.12 10*3/uL — ABNORMAL HIGH (ref 0.00–0.07)
Basophils Absolute: 0.1 10*3/uL (ref 0.0–0.1)
Basophils Relative: 0 %
Eosinophils Absolute: 0.1 10*3/uL (ref 0.0–0.5)
Eosinophils Relative: 1 %
HCT: 34.3 % — ABNORMAL LOW (ref 39.0–52.0)
Hemoglobin: 11.4 g/dL — ABNORMAL LOW (ref 13.0–17.0)
Immature Granulocytes: 1 %
Lymphocytes Relative: 14 %
Lymphs Abs: 2 10*3/uL (ref 0.7–4.0)
MCH: 26.2 pg (ref 26.0–34.0)
MCHC: 33.2 g/dL (ref 30.0–36.0)
MCV: 78.9 fL — ABNORMAL LOW (ref 80.0–100.0)
Monocytes Absolute: 1 10*3/uL (ref 0.1–1.0)
Monocytes Relative: 7 %
Neutro Abs: 11 10*3/uL — ABNORMAL HIGH (ref 1.7–7.7)
Neutrophils Relative %: 77 %
Platelets: 495 10*3/uL — ABNORMAL HIGH (ref 150–400)
RBC: 4.35 MIL/uL (ref 4.22–5.81)
RDW: 15.7 % — ABNORMAL HIGH (ref 11.5–15.5)
WBC: 14.2 10*3/uL — ABNORMAL HIGH (ref 4.0–10.5)
nRBC: 0 % (ref 0.0–0.2)

## 2019-02-17 LAB — COMPREHENSIVE METABOLIC PANEL
ALT: 30 U/L (ref 0–44)
AST: 21 U/L (ref 15–41)
Albumin: 3.5 g/dL (ref 3.5–5.0)
Alkaline Phosphatase: 86 U/L (ref 38–126)
Anion gap: 10 (ref 5–15)
BUN: 18 mg/dL (ref 6–20)
CO2: 28 mmol/L (ref 22–32)
Calcium: 9.3 mg/dL (ref 8.9–10.3)
Chloride: 93 mmol/L — ABNORMAL LOW (ref 98–111)
Creatinine, Ser: 0.91 mg/dL (ref 0.61–1.24)
GFR calc Af Amer: >60 mL/min (ref >60)
GFR calc non Af Amer: >60 mL/min (ref >60)
Glucose, Bld: 205 mg/dL — ABNORMAL HIGH (ref 70–99)
Potassium: 3.8 mmol/L (ref 3.5–5.1)
Sodium: 131 mmol/L — ABNORMAL LOW (ref 135–145)
Total Bilirubin: 0.5 mg/dL (ref 0.3–1.2)
Total Protein: 7 g/dL (ref 6.5–8.1)

## 2019-02-17 MED ORDER — SODIUM CHLORIDE 0.9 % IV SOLN
10.0000 mg | Freq: Once | INTRAVENOUS | Status: AC
  Administered 2019-02-17: 10 mg via INTRAVENOUS
  Filled 2019-02-17: qty 10

## 2019-02-17 MED ORDER — FLUOROURACIL CHEMO INJECTION 2.5 GM/50ML
400.0000 mg/m2 | Freq: Once | INTRAVENOUS | Status: AC
  Administered 2019-02-17: 850 mg via INTRAVENOUS
  Filled 2019-02-17: qty 17

## 2019-02-17 MED ORDER — CHLORPROMAZINE HCL 25 MG PO TABS: 25.0000 mg | ORAL_TABLET | Freq: Three times a day (TID) | ORAL | 1 refills | Status: DC

## 2019-02-17 MED ORDER — ATROPINE SULFATE 1 MG/ML IJ SOLN
1.0000 mg | Freq: Once | INTRAMUSCULAR | Status: AC
  Administered 2019-02-17: 1 mg via INTRAVENOUS
  Filled 2019-02-17: qty 1

## 2019-02-17 MED ORDER — SODIUM CHLORIDE 0.9 % IV SOLN
400.0000 mg/m2 | Freq: Once | INTRAVENOUS | Status: AC
  Administered 2019-02-17: 864 mg via INTRAVENOUS
  Filled 2019-02-17: qty 43.2

## 2019-02-17 MED ORDER — PALONOSETRON HCL INJECTION 0.25 MG/5ML
0.2500 mg | Freq: Once | INTRAVENOUS | Status: AC
  Administered 2019-02-17: 0.25 mg via INTRAVENOUS
  Filled 2019-02-17: qty 5

## 2019-02-17 MED ORDER — SODIUM CHLORIDE 0.9 % IV SOLN
2320.0000 mg/m2 | INTRAVENOUS | Status: DC
  Administered 2019-02-17: 5000 mg via INTRAVENOUS
  Filled 2019-02-17: qty 100

## 2019-02-17 MED ORDER — SODIUM CHLORIDE 0.9 % IV SOLN
Freq: Once | INTRAVENOUS | Status: AC
  Administered 2019-02-17: 11:00:00 via INTRAVENOUS

## 2019-02-17 MED ORDER — SODIUM CHLORIDE 0.9% FLUSH
10.0000 mL | INTRAVENOUS | Status: DC | PRN
  Administered 2019-02-17: 10 mL
  Filled 2019-02-17: qty 10

## 2019-02-17 MED ORDER — OXYCODONE-ACETAMINOPHEN 5-325 MG PO TABS
1.0000 | ORAL_TABLET | Freq: Once | ORAL | Status: AC
  Administered 2019-02-17: 1 via ORAL
  Filled 2019-02-17: qty 1

## 2019-02-17 MED ORDER — LACTULOSE 20 GM/30ML PO SOLN: 30.0000 mL | Freq: Two times a day (BID) | ORAL | 1 refills | Status: DC

## 2019-02-17 MED ORDER — SODIUM CHLORIDE 0.9 % IV SOLN
Freq: Once | INTRAVENOUS | Status: AC
  Administered 2019-02-17: 12:00:00 via INTRAVENOUS

## 2019-02-17 MED ORDER — OXYCODONE HCL 20 MG PO TABS: 1.0000 | ORAL_TABLET | ORAL | 0 refills | Status: DC | PRN

## 2019-02-17 MED ORDER — SODIUM CHLORIDE 0.9 % IV SOLN
144.0000 mg/m2 | Freq: Once | INTRAVENOUS | Status: AC
  Administered 2019-02-17: 320 mg via INTRAVENOUS
  Filled 2019-02-17: qty 15

## 2019-02-17 NOTE — Progress Notes (Signed)
1100 Labs reviewed with and pt seen by Dr. Delton Coombes and pt approved for chemo tx today per MD                            Maxwell Henderson tolerated chemo tx well without complaints or incident. Pt discharged with 5FU pump infusing without issues. VSS upon discharge. Pt discharged self ambulatory in satisfactory condition

## 2019-02-17 NOTE — Progress Notes (Signed)
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 Barney Henderson. Partially obstructing, likely malignant esophageal tumor was found in the distal esophagus. Biopsied with surgical path demonstrating Adenocarcinoma. Injected. Treated with argon plasma coagulation (APC) #3. 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 Biopsied with surgical path demonstrating Adenocarcinoma.   01/19/2017 PET scan   NECK Two FDG avid nodes are seen in the base of the neck on the right. The first is seen on CT image 35, just posterior to the right thyroid lobe and the other is seen on CT image 41 measuring 16 mm in short axis with a maximum SUV of 8.6.  CHEST  The patient's known malignancy involving the distal esophagus and cardia of the stomach is FDG avid with a maximum SUV of 10.4. No definitive metastatic nodes within the chest.  ABDOMEN/PELVIS  FDG avid metastatic adenopathy is seen in the gastrohepatic ligament, paraceliac nodes, posterior to the IVC on image 111, and to the left of the SMA on image 112. The most inferior node is seen in the aortocaval region on image 117. The representative node to the left of the SMA was measured on series 4, image 111 measuring 2.5 by 2.1 cm with a maximum SUV of 7. No other FDG avid disease in the abdomen or pelvis. Cholelithiasis is identified.   02/02/2017 Procedure   US guided bx of right supraclavicular LN by IR.   02/05/2017 Pathology Results   Lymph node, needle/core biopsy, Right Supraclavicular - METASTATIC POORLY  DIFFERENTIATED CARCINOMA.   02/10/2017 Pathology Results   HER2 - NEGATIVE   02/13/2017 Procedure   Port placed by IR   02/15/2017 - 04/25/2017 Chemotherapy   FOLFOX x 6 cycles    05/02/2017 Imaging   CT C/A/P: IMPRESSION: 1. Nodular thickening and hyperenhancement in the distal esophagus and gastric cardia could reflect residual tumor. 2. No significant change in size of previously demonstrated hypermetabolic upper abdominal lymph nodes. No progressive adenopathy identified. No residual enlarged supraclavicular nodes are seen. 3. No evidence of progressive metastatic disease. No worrisome hepatic findings. 4. Tiny right upper lobe pulmonary nodule, not clearly seen on low dose previous PET-CT. Attention on follow-up recommended. 5. Cholelithiasis.   12/02/2018 -  Chemotherapy   The patient had palonosetron (ALOXI) injection 0.25 mg, 0.25 mg, Intravenous,  Once, 5 of 6 cycles Administration: 0.25 mg (12/02/2018), 0.25 mg (12/16/2018), 0.25 mg (12/30/2018), 0.25 mg (01/13/2019), 0.25 mg (02/17/2019) irinotecan (CAMPTOSAR) 380 mg in dextrose 5 % 500 mL chemo infusion, 180 mg/m2 = 380 mg, Intravenous,  Once, 5 of 6 cycles Dose modification: 144 mg/m2 (80 % of original dose 180 mg/m2, Cycle 2, Reason: Provider Judgment) Administration: 380 mg (12/02/2018), 320 mg (12/16/2018), 320 mg (12/30/2018), 320 mg (01/13/2019), 320 mg (02/17/2019) leucovorin 864 mg in dextrose 5 % 250 mL infusion, 400 mg/m2 = 864 mg, Intravenous,  Once, 5 of 6 cycles Administration: 864 mg (12/02/2018), 864 mg (12/16/2018), 864 mg (12/30/2018), 864 mg (01/13/2019), 864 mg (02/17/2019) fluorouracil (ADRUCIL) chemo injection 850 mg, 400 mg/m2 = 850 mg, Intravenous,  Once, 5 of 6 cycles Administration: 297  mg (12/02/2018), 850 mg (12/16/2018), 850 mg (12/30/2018), 850 mg (01/13/2019), 850 mg (02/17/2019) fluorouracil (ADRUCIL) 5,000 mg in sodium chloride 0.9 % 150 mL chemo infusion, 2,320 mg/m2 = 5,200 mg, Intravenous, 1 Day/Dose, 5 of 6  cycles Administration: 5,000 mg (12/02/2018), 5,000 mg (12/16/2018), 5,000 mg (12/30/2018), 5,000 mg (01/13/2019), 5,000 mg (02/17/2019)  for chemotherapy treatment.       Henderson STAGING: Henderson Staging Primary esophageal adenocarcinoma [facility]) Staging form: Esophagus - Adenocarcinoma, AJCC 8th Edition - Clinical stage from 02/11/2017: Stage IVA (cTX, cN3, cM0) - Signed by Maxwell Cancer, PA-C on 02/11/2017    INTERVAL HISTORY:  Maxwell Henderson 53 y.o. male returns for follow-up of stage IV GE junction adenocarcinoma.  He had couple of trips to [facility] for EGD and stent placement.  This was followed by another EGD on 02/09/2019 for a PEG tube placement.  He is currently taking in 7 cans of Osmolite 1.5 daily.  He is also taking oxycodone 20 mg every 4 hours for pain control.  He complains of hiccups.  Appetite and energy levels are low.  He gained about 3 pounds since last visit.  He is also constipated.  Denies any fevers, night sweats or significant weight loss since last visit.  Denies any nausea or vomiting or diarrhea.      REVIEW OF SYSTEMS:  Review of Systems  Gastrointestinal: Positive for constipation.  All other systems reviewed and are negative.    PAST MEDICAL/SURGICAL HISTORY:  Past Medical History:  Diagnosis Date   Hypertension    Iron deficiency anemia due to chronic blood loss 02/28/2017   Primary esophageal adenocarcinoma ([place]) 01/29/2017   Past Surgical History:  Procedure Laterality Date   ESOPHAGOGASTRODUODENOSCOPY (EGD) WITH PROPOFOL N/A 01/16/2017   Procedure: ESOPHAGOGASTRODUODENOSCOPY (EGD) WITH PROPOFOL;  Surgeon: Maxwell Binder, MD;  Location[facility];  Service: Endoscopy;  Laterality: N/A;  730    IR FLUORO GUIDE PORT INSERTION RIGHT  02/13/2017   IR US GUIDE VASC ACCESS RIGHT  02/13/2017   lipoma removal     PORTA CATH INSERTION Right 02/13/2017   SAVORY DILATION N/A 01/16/2017   Procedure: SAVORY DILATION;  Surgeon: Maxwell Binder, MD;   Location: [facility];  Service: Endoscopy;  Laterality: N/A;     SOCIAL HISTORY:  Social History   Socioeconomic History   Marital status: Married    Spouse name: Not on file   Number of children: Not on file   Years of education: Not on file   Highest education level: Not on file  Occupational History   Not on file  Social Needs   Financial resource strain: Not on file   Food insecurity    Worry: Not on file    Inability: Not on file   Transportation needs    Medical: Not on file    Non-medical: Not on file  Tobacco Use   Smoking status: Current Some Day Smoker    Packs/day: 0.25    Types: Cigarettes   Smokeless tobacco: Never Used  Substance and Sexual Activity   Alcohol use: No    Comment: hx heavy alcohol, hasn't drank in 22 yrs   Drug use: Yes    Types: Marijuana   Sexual activity: Yes  Lifestyle   Physical activity    Days per week: Not on file    Minutes per session: Not on file   Stress: Not on file  Relationships   Social connections    Talks on phone:  Not on file    Gets together: Not on file    Attends religious service: Not on file    Active member of club or organization: Not on file    Attends meetings of clubs or organizations: Not on file    Relationship status: Not on file   Intimate partner violence    Fear of current or ex partner: Not on file    Emotionally abused: Not on file    Physically abused: Not on file    Forced sexual activity: Not on file  Other Topics Concern   Not on file  Social History Narrative   WORKING [employer].    FAMILY HISTORY:  Family History  Problem Relation Age of Onset   Prostate Henderson Father    Colon Henderson Neg Hx    Colon polyps Neg Hx     CURRENT MEDICATIONS:  Outpatient Encounter Medications as of 02/17/2019  Medication Sig   Alpha-Lipoic Acid 600 MG CAPS Take 1 capsule (600 mg total) by mouth daily.   amLODipine (NORVASC) 10 MG tablet Take 1 tablet by  mouth once daily   B Complex-C (B-COMPLEX WITH VITAMIN C) tablet Take 1 tablet by mouth daily.   famotidine (PEPCID) 20 MG tablet Take by mouth.   lidocaine (XYLOCAINE) 2 % solution Use as directed 15 mLs in the mouth or throat every 3 (three) hours as needed.   lidocaine-prilocaine (EMLA) cream Apply 1 application topically as needed.   Morphine Sulfate (MORPHINE CONCENTRATE) 10 mg / 0.5 ml concentrated solution Take 0.5 mLs (10 mg total) by mouth every 4 (four) hours as needed for severe pain.   pantoprazole (PROTONIX) 40 MG tablet Take 1 tablet (40 mg total) by mouth 2 (two) times daily. Take 30 minutes before breakfast   prochlorperazine (COMPAZINE) 10 MG tablet TAKE 1 TABLET BY MOUTH EVERY 6 HOURS AS NEEDED FOR NAUSEA AND VOMITING   promethazine (PHENERGAN) 25 MG suppository Place 1 suppository (25 mg total) rectally every 6 (six) hours as needed for nausea or vomiting.   SSD 1 % cream APPLY CREAM ONCE A DAY AS INSTRUCTED   sucralfate (CARAFATE) 1 g tablet Take by mouth.   tamsulosin (FLOMAX) 0.4 MG CAPS capsule Take 1 capsule (0.4 mg total) by mouth at bedtime.   chlorproMAZINE (THORAZINE) 25 MG tablet Take 1 tablet (25 mg total) by mouth 3 (three) times daily.   dexamethasone (DECADRON) 4 MG tablet Take 8 mg by mouth 2 (two) times daily.   Hyoscyamine Sulfate SL (LEVSIN/SL) 0.125 MG SUBL Place 1 tablet under the tongue every 4 (four) hours as needed. (Patient not taking: Reported on 02/17/2019)   Lactulose 20 GM/30ML SOLN Take 30 mLs (20 g total) by mouth 2 (two) times a day.   Oxycodone HCl 20 MG TABS Take 1 tablet (20 mg total) by mouth every 4 (four) hours as needed.   [DISCONTINUED] acetaminophen (TYLENOL) 325 MG tablet Take 2 tablets (650 mg total) by mouth every 4 (four) hours as needed for headache. (Patient not taking: Reported on 02/03/2019)   [DISCONTINUED] gabapentin (NEURONTIN) 300 MG capsule Take 1 capsule by mouth twice daily (Patient not taking: Reported on  02/03/2019)   [DISCONTINUED] loratadine (CLARITIN) 10 MG tablet Take 1 tablet (10 mg total) by mouth daily.   [DISCONTINUED] Misc. Devices MISC Please provide the patient with the [facility]othe[facility] HPB cough Syrup. 1 teaspoonful every 4-6 hours PRN. Please provide the patient with 360 ml. (Patient not taking: Reported on 02/03/2019)   [  DISCONTINUED] Oxycodone HCl 20 MG TABS TAKE 1 TABLET BY MOUTH EVERY 4 HOURS AS NEEDED FOR UP TO 5 DAYS FOR MODERATE PAIN   No facility-administered encounter medications on file as of 02/17/2019.     ALLERGIES:  No Known Allergies   PHYSICAL EXAM:  ECOG Performance status: 1  Vitals:   02/17/19 0900  BP: (!) 137/95  Pulse: (!) 125  Resp: 18  Temp: (!) 97.5 F (36.4 C)  SpO2: 100%   Filed Weights   02/17/19 0900  Weight: 171 lb (77.6 kg)    Physical Exam Vitals signs reviewed.  Constitutional:      Appearance: Normal appearance.  Cardiovascular:     Rate and Rhythm: Normal rate and regular rhythm.     Heart sounds: Normal heart sounds.  Pulmonary:     Effort: Pulmonary effort is normal.     Breath sounds: Normal breath sounds.  Abdominal:     General: There is no distension.     Palpations: Abdomen is soft. There is no mass.  Musculoskeletal:        General: No swelling.  Neurological:     General: No focal deficit present.     Mental Status: He is alert and oriented to person, place, and time.  Psychiatric:        Mood and Affect: Mood normal.        Behavior: Behavior normal.    PEG tube in place.  LABORATORY DATA:  I have reviewed the labs as listed.  CBC    Component Value Date/Time   WBC 14.2 (H) 02/17/2019 0847   RBC 4.35 02/17/2019 0847   HGB 11.4 (L) 02/17/2019 0847   HCT 34.3 (L) 02/17/2019 0847   PLT 495 (H) 02/17/2019 0847   MCV 78.9 (L) 02/17/2019 0847   MCH 26.2 02/17/2019 0847   MCHC 33.2 02/17/2019 0847   RDW 15.7 (H) 02/17/2019 0847   LYMPHSABS 2.0 02/17/2019 0847   MONOABS 1.0 02/17/2019 0847   EOSABS 0.1  02/17/2019 0847   BASOSABS 0.1 02/17/2019 0847   CMP Latest Ref Rng & Units 02/17/2019 02/07/2019 02/03/2019  Glucose 70 - 99 mg/dL 205(H) 135(H) 132(H)  BUN 6 - 20 mg/dL _0 Creatinine 0.61 - 1.24 mg/dL 0.91 1.02 1.09  Sodium 135 - 145 mmol/L 131(L) 137 137  Potassium 3.5 - 5.1 mmol/L 3.8 3.1(L) 4.3  Chloride 98 - 111 mmol/L 93(L) 100 99  CO2 22 - 32 mmol/L _1 Calcium 8.9 - 10.3 mg/dL 9.3 9.4 10.1  Total Protein 6.5 - 8.1 g/dL 7.0 7.3 8.1  Total Bilirubin 0.3 - 1.2 mg/dL 0.5 1.0 1.1  Alkaline Phos 38 - 126 U/L 86 62 54  AST 15 - 41 U/L 21 14(L) 17  ALT 0 - 44 U/L _2 DIAGNOSTIC IMAGING:  I have independently reviewed the scans and discussed with the patient.   I have reviewed Venita Lick LPN's note and agree with the documentation.  I personally performed a face-to-face visit, made revisions and my assessment and plan is as follows.    ASSESSMENT & PLAN:   Primary esophageal adenocarcinoma ([place]) 1.  Stage IV GE junction adenocarcinoma: - 6 cycles of FOLFOX from 02/15/2017 through 04/25/2017. -Paclitaxel and Cyramza from 05/18/2017, paclitaxel discontinued on 09/21/2017 secondary to neuropathy. -Foundation 1 CDX showing MS stable,TMB 3 muts/mb - Cyramza was discontinued on 11/15/2018 due to progression. - EGD by Dr. Truddie Crumble at [facility] on  11/25/2018 showing progression of the luminal mass. - CT [facility] on 08/23/2018 shows necrotic nodes with peripheral rim enhancement, largest measuring 19 mm, increased from 18 mm previously.  Another lymph node which increased to 12 mm from 8 mm previously.  No new lesions seen.  Thickening at the GE junction stable. - 3 cycles of FOLFIRI from 12/02/2018 through 12/30/2018.  Irinotecan dose reduced by 20% during cycle 2 secondary to GI side effects. - Due to continued weight loss, he underwent EGD and stent placement on 01/30/2019.  Tumor was seen extending from 35-40 cm.  It also extends into the cardia. - He had problems after  stent placement with nausea and vomiting.  He also developed abdominal pain.  He had a repeat EGD on 02/09/2019.  As he was not able to tolerate much by mouth, PEG tube was placed on the same day. - Today he is very upset because of his weight loss and the requirement for feeding tube.  I tried to explain to him that we have given chemotherapy to see if it will slow down the progression of his tumor. -I have called his wife and had a prolonged discussion with her. -As per his request, we will make a referral to [place] oncology for further care. - Patient's wife reports that they already have an appointment to see Dr. Melven Sartorius end of this month. -  2.  Weight loss: -He lost approximately 32 pounds since April. - He has a PEG tube placed on 02/09/2019.  He is taking and 1 can of Osmolite 1.57 times a day. -He gained about 3 pounds since last visit.  3.  Peripheral neuropathy: -He is not taking gabapentin at this time.  Neuropathy is from paclitaxel which was discontinued on 09/21/2017.  4.  Abdominal pain: -I have renewed his oxycodone 20 mg every 4 hours as needed.  This is controlling the pain. -We have also given prescription for lactulose for severe constipation.  5.  Hiccups: - He is complaining of hiccups.  This is most likely due to stent. -We have sent prescription for chlorpromazine 25 mg 3 times a day as needed.   Total time spent is 45 minutes with more than 50% of the time spent face-to-face discussing treatment plan, management of side effects, counseling and coordination of care.    Orders placed this encounter:  No orders of the defined types were placed in this encounter.     Derek Jack, MD [place] [phone]

## 2019-02-17 NOTE — Patient Instructions (Signed)
[facility] at [facility] Discharge Instructions  You were seen today by Dr. Katragadda. He went over your recent lab results. He will see you back in  for labs and follow up.   Thank you for choosing [facility] at [facility] to provide your oncology and hematology care.  To afford each patient quality time with our provider, please arrive at least 15 minutes before your scheduled appointment time.   If you have a lab appointment with the Cancer Center please come in thru the  Main Entrance and check in at the main information desk  You need to re-schedule your appointment should you arrive 10 or more minutes late.  We strive to give you quality time with our providers, and arriving late affects you and other patients whose appointments are after yours.  Also, if you no show three or more times for appointments you may be dismissed from the clinic at the providers discretion.     Again, thank you for choosing [facility].  Our hope is that these requests will decrease the amount of time that you wait before being seen by our physicians.       _____________________________________________________________  Should you have questions after your visit to [facility], please contact our office at [phone] between the hours of 8:00 a.m. and 4:30 p.m.  Voicemails left after 4:00 p.m. will not be returned until the following business day.  For prescription refill requests, have your pharmacy contact our office and allow 72 hours.    Cancer Center Support Programs:   > Cancer Support Group  2nd Tuesday of the month 1pm-2pm, Journey Room    

## 2019-02-17 NOTE — Patient Instructions (Signed)
[facility] Discharge Instructions for Patients Receiving Chemotherapy   Beginning January 23rd 2017 lab work for the Cancer Center will be done in the  Main lab at [place] on 1st floor. If you have a lab appointment with the Cancer Center please come in thru the  Main Entrance and check in at the main information desk   Today you received the following chemotherapy agents Irinotecan,Leucovorin and 5FU. Follow-up as scheduled. Call clinic for any questions or concerns  To help prevent nausea and vomiting after your treatment, we encourage you to take your nausea medication   If you develop nausea and vomiting, or diarrhea that is not controlled by your medication, call the clinic.  The clinic phone number is [phone]. Office hours are Monday-Friday 8:30am-5:00pm.  BELOW ARE SYMPTOMS THAT SHOULD BE REPORTED IMMEDIATELY:  *FEVER GREATER THAN 101.0 F  *CHILLS WITH OR WITHOUT FEVER  NAUSEA AND VOMITING THAT IS NOT CONTROLLED WITH YOUR NAUSEA MEDICATION  *UNUSUAL SHORTNESS OF BREATH  *UNUSUAL BRUISING OR BLEEDING  TENDERNESS IN MOUTH AND THROAT WITH OR WITHOUT PRESENCE OF ULCERS  *URINARY PROBLEMS  *BOWEL PROBLEMS  UNUSUAL RASH Items with * indicate a potential emergency and should be followed up as soon as possible. If you have an emergency after office hours please contact your primary care physician or go to the nearest emergency department.  Please call the clinic during office hours if you have any questions or concerns.   You may also contact the Patient Navigator at [phone] should you have any questions or need assistance in obtaining follow up care.      Resources For Cancer Patients and their Caregivers ? American Cancer Society: Can assist with transportation, wigs, general needs, runs Look Good Feel Better.        1-888-227-6333 ? Cancer Care: Provides financial assistance, online support groups, medication/co-pay assistance.   1-800-813-HOPE (4673) ? [facility] Assists [place] cancer patients and their families through emotional , educational and financial support.  336-427-4357 ? [place] DSS Where to apply for food stamps, Medicaid and utility assistance. 336-342-1394 ? [facility]: Transportation to medical appointments. 336-347-2287 ? Social Security Administration: May apply for disability if have a Stage IV cancer. 336-342-7796 1-800-772-1213 ? [place] Aging, Disability and Transit Services: Assists with nutrition, care and transit needs. 336-349-2343         

## 2019-02-17 NOTE — Progress Notes (Signed)
02/17/19  Ok to treat with HR 105   T.O. Dr Forbes Cellar RN/[place]ilee [place]oll Ronnald Ramp, PharmD

## 2019-02-17 NOTE — Assessment & Plan Note (Addendum)
1.  Stage IV GE junction adenocarcinoma: - 6 cycles of FOLFOX from 02/15/2017 through 04/25/2017. -Paclitaxel and Cyramza from 05/18/2017, paclitaxel discontinued on 09/21/2017 secondary to neuropathy. -Foundation 1 CDX showing MS stable,TMB 3 muts/mb - Cyramza was discontinued on 11/15/2018 due to progression. - EGD by Dr. Truddie Crumble at [facility] on 11/25/2018 showing progression of the luminal mass. - CT [facility] on 08/23/2018 shows necrotic nodes with peripheral rim enhancement, largest measuring 19 mm, increased from 18 mm previously.  Another lymph node which increased to 12 mm from 8 mm previously.  No new lesions seen.  Thickening at the GE junction stable. - 3 cycles of FOLFIRI from 12/02/2018 through 12/30/2018.  Irinotecan dose reduced by 20% during cycle 2 secondary to GI side effects. - Due to continued weight loss, he underwent EGD and stent placement on 01/30/2019.  Tumor was seen extending from 35-40 cm.  It also extends into the cardia. - He had problems after stent placement with nausea and vomiting.  He also developed abdominal pain.  He had a repeat EGD on 02/09/2019.  As he was not able to tolerate much by mouth, PEG tube was placed on the same day. - Today he is very upset because of his weight loss and the requirement for feeding tube.  I tried to explain to him that we have given chemotherapy to see if it will slow down the progression of his tumor. -I have called his wife and had a prolonged discussion with her. -As per his request, we will make a referral to [place] oncology for further care. - Patient's wife reports that they already have an appointment to see Dr. Melven Sartorius end of this month. -  2.  Weight loss: -He lost approximately 32 pounds since April. - He has a PEG tube placed on 02/09/2019.  He is taking and 1 can of Osmolite 1.57 times a day. -He gained about 3 pounds since last visit.  3.  Peripheral neuropathy: -He is not taking gabapentin at this time.   Neuropathy is from paclitaxel which was discontinued on 09/21/2017.  4.  Abdominal pain: -I have renewed his oxycodone 20 mg every 4 hours as needed.  This is controlling the pain. -We have also given prescription for lactulose for severe constipation.  5.  Hiccups: - He is complaining of hiccups.  This is most likely due to stent. -We have sent prescription for chlorpromazine 25 mg 3 times a day as needed.

## 2019-02-18 ENCOUNTER — Observation Stay (HOSPITAL_COMMUNITY)
Admission: EM | Admit: 2019-02-18 | Discharge: 2019-02-20 | Disposition: A | Discharge status: Home / Self Care | Payer: Medicaid Other | Attending: Emergency Medicine | Admitting: Emergency Medicine

## 2019-02-18 ENCOUNTER — Other Ambulatory Visit: Payer: Self-pay

## 2019-02-18 ENCOUNTER — Inpatient Hospital Stay (HOSPITAL_COMMUNITY): Payer: Medicaid Other

## 2019-02-18 ENCOUNTER — Emergency Department (HOSPITAL_COMMUNITY): Payer: Medicaid Other

## 2019-02-18 ENCOUNTER — Encounter (HOSPITAL_COMMUNITY): Payer: Self-pay

## 2019-02-18 ENCOUNTER — Encounter (HOSPITAL_COMMUNITY): Payer: Self-pay | Admitting: Emergency Medicine

## 2019-02-18 VITALS — BP 135/80 | HR 98 | Temp 98.5°F | Resp 18

## 2019-02-18 DIAGNOSIS — I10 Essential (primary) hypertension: Secondary | ICD-10-CM | POA: Insufficient documentation

## 2019-02-18 DIAGNOSIS — Z79899 Other long term (current) drug therapy: Secondary | ICD-10-CM | POA: Insufficient documentation

## 2019-02-18 DIAGNOSIS — Z5111 Encounter for antineoplastic chemotherapy: Secondary | ICD-10-CM | POA: Diagnosis not present

## 2019-02-18 DIAGNOSIS — J45909 Unspecified asthma, uncomplicated: Secondary | ICD-10-CM | POA: Insufficient documentation

## 2019-02-18 DIAGNOSIS — R112 Nausea with vomiting, unspecified: Secondary | ICD-10-CM

## 2019-02-18 DIAGNOSIS — T451X5A Adverse effect of antineoplastic and immunosuppressive drugs, initial encounter: Secondary | ICD-10-CM | POA: Diagnosis not present

## 2019-02-18 DIAGNOSIS — C158 Malignant neoplasm of overlapping sites of esophagus: Secondary | ICD-10-CM

## 2019-02-18 DIAGNOSIS — R000 Tachycardia, unspecified: Principal | ICD-10-CM | POA: Insufficient documentation

## 2019-02-18 DIAGNOSIS — F17210 Nicotine dependence, cigarettes, uncomplicated: Secondary | ICD-10-CM | POA: Insufficient documentation

## 2019-02-18 DIAGNOSIS — C159 Malignant neoplasm of esophagus, unspecified: Secondary | ICD-10-CM

## 2019-02-18 DIAGNOSIS — R109 Unspecified abdominal pain: Secondary | ICD-10-CM | POA: Diagnosis not present

## 2019-02-18 DIAGNOSIS — Y69 Unspecified misadventure during surgical and medical care: Secondary | ICD-10-CM | POA: Insufficient documentation

## 2019-02-18 DIAGNOSIS — Z8501 Personal history of malignant neoplasm of esophagus: Secondary | ICD-10-CM | POA: Diagnosis not present

## 2019-02-18 DIAGNOSIS — Z03818 Encounter for observation for suspected exposure to other biological agents ruled out: Secondary | ICD-10-CM | POA: Diagnosis not present

## 2019-02-18 LAB — CBC
HCT: 36.3 % — ABNORMAL LOW (ref 39.0–52.0)
Hemoglobin: 12.1 g/dL — ABNORMAL LOW (ref 13.0–17.0)
MCH: 26.3 pg (ref 26.0–34.0)
MCHC: 33.3 g/dL (ref 30.0–36.0)
MCV: 78.9 fL — ABNORMAL LOW (ref 80.0–100.0)
Platelets: 494 10*3/uL — ABNORMAL HIGH (ref 150–400)
RBC: 4.6 MIL/uL (ref 4.22–5.81)
RDW: 15.9 % — ABNORMAL HIGH (ref 11.5–15.5)
WBC: 12.4 10*3/uL — ABNORMAL HIGH (ref 4.0–10.5)
nRBC: 0 % (ref 0.0–0.2)

## 2019-02-18 LAB — LACTIC ACID, PLASMA
Lactic Acid, Venous: 2.4 mmol/L (ref 0.5–1.9)
Lactic Acid, Venous: 2.2 mmol/L (ref 0.5–1.9)

## 2019-02-18 LAB — COMPREHENSIVE METABOLIC PANEL
ALT: 32 U/L (ref 0–44)
AST: 25 U/L (ref 15–41)
Albumin: 3.7 g/dL (ref 3.5–5.0)
Alkaline Phosphatase: 82 U/L (ref 38–126)
Anion gap: 12 (ref 5–15)
BUN: 18 mg/dL (ref 6–20)
CO2: 29 mmol/L (ref 22–32)
Calcium: 9.6 mg/dL (ref 8.9–10.3)
Chloride: 97 mmol/L — ABNORMAL LOW (ref 98–111)
Creatinine, Ser: 0.92 mg/dL (ref 0.61–1.24)
GFR calc Af Amer: >60 mL/min (ref >60)
GFR calc non Af Amer: >60 mL/min (ref >60)
Glucose, Bld: 158 mg/dL — ABNORMAL HIGH (ref 70–99)
Potassium: 3.9 mmol/L (ref 3.5–5.1)
Sodium: 138 mmol/L (ref 135–145)
Total Bilirubin: 0.6 mg/dL (ref 0.3–1.2)
Total Protein: 7.1 g/dL (ref 6.5–8.1)

## 2019-02-18 LAB — SARS CORONAVIRUS 2 BY RT PCR (HOSPITAL ORDER, PERFORMED IN ~~LOC~~ HOSPITAL LAB): SARS Coronavirus 2: NEGATIVE

## 2019-02-18 LAB — LIPASE, BLOOD: Lipase: 28 U/L (ref 11–51)

## 2019-02-18 MED ORDER — SODIUM CHLORIDE 0.9 % IV SOLN
INTRAVENOUS | Status: DC
  Administered 2019-02-18: 11:00:00 via INTRAVENOUS

## 2019-02-18 MED ORDER — HYDROMORPHONE HCL 1 MG/ML IJ SOLN
1.0000 mg | Freq: Once | INTRAMUSCULAR | Status: AC
  Administered 2019-02-18: 1 mg via INTRAVENOUS
  Filled 2019-02-18: qty 1

## 2019-02-18 MED ORDER — SODIUM CHLORIDE 0.9 % IV SOLN
Freq: Once | INTRAVENOUS | Status: AC
  Administered 2019-02-19 (×3): via INTRAVENOUS

## 2019-02-18 MED ORDER — HYDROMORPHONE HCL 1 MG/ML IJ SOLN
1.0000 mg | Freq: Once | INTRAMUSCULAR | Status: AC
  Administered 2019-02-19: 1 mg via INTRAVENOUS
  Filled 2019-02-18: qty 1

## 2019-02-18 MED ORDER — ONDANSETRON HCL 4 MG/2ML IJ SOLN
4.0000 mg | Freq: Once | INTRAMUSCULAR | Status: AC
  Administered 2019-02-18: 4 mg via INTRAVENOUS
  Filled 2019-02-18: qty 2

## 2019-02-18 MED ORDER — ONDANSETRON 4 MG PO TBDP
4.0000 mg | ORAL_TABLET | Freq: Once | ORAL | Status: AC
  Administered 2019-02-18: 17:00:00 4 mg via ORAL

## 2019-02-18 MED ORDER — SODIUM CHLORIDE 0.9 % IV BOLUS
1000.0000 mL | Freq: Once | INTRAVENOUS | Status: AC
  Administered 2019-02-18: 1000 mL via INTRAVENOUS

## 2019-02-18 MED ORDER — SODIUM CHLORIDE 0.9 % IV BOLUS
1000.0000 mL | Freq: Once | INTRAVENOUS | Status: AC
  Administered 2019-02-18: 21:00:00 1000 mL via INTRAVENOUS

## 2019-02-18 MED ORDER — METOCLOPRAMIDE HCL 5 MG/ML IJ SOLN
10.0000 mg | Freq: Once | INTRAMUSCULAR | Status: AC
  Administered 2019-02-18: 22:00:00 10 mg via INTRAVENOUS
  Filled 2019-02-18: qty 2

## 2019-02-18 MED ORDER — SODIUM CHLORIDE 0.9% FLUSH
10.0000 mL | Freq: Once | INTRAVENOUS | Status: AC
  Administered 2019-02-18: 10 mL via INTRAVENOUS

## 2019-02-18 MED ORDER — IOHEXOL 300 MG/ML  SOLN
100.0000 mL | Freq: Once | INTRAMUSCULAR | Status: AC | PRN
  Administered 2019-02-18: 100 mL via INTRAVENOUS

## 2019-02-18 MED ORDER — HYDROMORPHONE HCL 1 MG/ML IJ SOLN
1.0000 mg | Freq: Once | INTRAMUSCULAR | Status: AC
  Administered 2019-02-18: 21:00:00 1 mg via INTRAVENOUS
  Filled 2019-02-18: qty 1

## 2019-02-18 MED ORDER — OXYCODONE HCL 5 MG PO TABS
20.0000 mg | ORAL_TABLET | Freq: Once | ORAL | Status: AC
  Administered 2019-02-18: 20 mg via ORAL
  Filled 2019-02-18: qty 4

## 2019-02-18 NOTE — ED Notes (Signed)
Patient transported to CT 

## 2019-02-18 NOTE — ED Notes (Signed)
CRITICAL VALUE ALERT  Critical Value:  Lactic Acid 2.4 Date & Time Notied: 02/18/19 Provider Notified: Lyndal Rainbow, PA-C Orders Received/Actions taken: None yet

## 2019-02-18 NOTE — ED Provider Notes (Signed)
[facility] EMERGENCY DEPARTMENT Provider Note   CSN: [id number] Arrival date & time: 02/18/19  1658    History   Chief Complaint Chief Complaint  Patient presents with  . Emesis    HPI Maxwell Henderson is a 53 y.o. male presenting for evaluation of nausea, vomiting, and abd pain.   Patient states he has been having a lot of stomach issues since he has been treated for esophageal cancer.  [facility], had been doing well until yesterday when he developed persistent nausea, vomiting, abdominal pain.  He reports generalized abdominal pain which is constant.  His emesis is prompted by both p.o. intake as well as spontaneous.  Patient has had to be admitted multiple times for intractable nausea and vomiting.  His GI doctor is Dr. Truddie Crumble over at [facility].  His cancer doctor is here at [facility].  He has medication for nausea, but has not been able to keep it down.  He denies recent fevers, chills, chest pain, shortness of breath.  He reports no bowel movement in 3 weeks, this has not not been investigated or imaged.  He states he is using enemas and p.o. medication without improvement.  He also reports difficulty urinating, and that it is hard to urinate.  It does not hurt, burn, or have blood.  Additional history obtained from chart review, patient with additional history of diabetes and hypertension.     HPI  Past Medical History:  Diagnosis Date  . Diabetes mellitus without complication ([place])   . Hypertension   . 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 Esophageal cancer ([place]) 02/03/2019  . Goals of care, counseling/discussion 11/28/2018  . Type 2 diabetes mellitus without complication, without long-term current use of insulin ([place])   . Essential hypertension   . SIRS (systemic inflammatory response syndrome) ([place])  12/11/2017  . HCAP (healthcare-associated pneumonia) 12/11/2017  . Infection due to parainfluenza virus 3 12/11/2017  . PNA (pneumonia) 12/10/2017  . Hypokalemia 12/10/2017  . 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 Dysphagia 01/05/2017    Past Surgical History:  Procedure Laterality Date  . ESOPHAGOGASTRODUODENOSCOPY (EGD) WITH PROPOFOL N/A 01/16/2017   Procedure: ESOPHAGOGASTRODUODENOSCOPY (EGD) WITH PROPOFOL;  Surgeon: [name], MD;  Location: [facility] ENDO SU[facility];  Service: Endoscopy;  Laterality: N/A;  730   . IR FLUORO GUIDE PORT INSERTION RIGHT  02/13/2017  . IR US GUIDE VASC ACCESS RIGHT  02/13/2017  . lipoma removal    . PORTA CATH INSERTION Right 02/13/2017  . SAVORY DILATION N/A 01/16/2017   Procedure: SAVORY DILATION;  Surgeon: [name], MD;  Location: [facility] END[facility];  Service: Endoscopy;  Laterality: N/A;        Home Medications    Prior to Admission medications   Medication Sig Start Date End Date Taking? Authorizing Provider  Alpha-Lipoic Acid 600 MG CAPS Take 1 capsule (600 mg total) by mouth daily. 09/28/17  Yes Holley Bouche, NP  amLODipine (NORVASC) 10 MG tablet Take 1 tablet by mouth once daily 12/18/18  Yes Lockamy, Randi L, NP-C  B Complex-C (B-COMPLEX WITH VITAMIN C) tablet Take 1 tablet by mouth daily. 09/28/17  Yes Holley Bouche, NP  famotidine (PEPCID) 20 MG tablet Take 20 mg by mouth 2 (  two) times daily.  02/12/19 02/12/20 Yes [provider]  Hyoscyamine Sulfate SL (LEVSIN/SL) 0.125 MG SUBL Place 1 tablet under the tongue every 4 (four) hours as needed. Patient taking differently: Place 1 tablet under the tongue every 4 (four) hours as needed (for cramping-abdominal pain).  12/06/18  Yes Derek Jack, MD  Lactulose 20 GM/30ML SOLN Take 30 mLs (20 g total) by mouth 2 (two) times a day. 02/17/19  Yes Derek Jack, MD  lidocaine (XYLOCAINE) 2 % solution Use  as directed 15 mLs in the mouth or throat every 3 (three) hours as needed for mouth pain.  01/30/19  Yes [provider]  lidocaine-prilocaine (EMLA) cream Apply 1 application topically as needed. Patient taking differently: Apply 1 application topically as needed (for port access).  01/18/18  Yes Derek Jack, MD  Morphine Sulfate (MORPHINE CONCENTRATE) 10 mg / 0.5 ml concentrated solution Take 0.5 mLs (10 mg total) by mouth every 4 (four) hours as needed for severe pain. 12/31/18  Yes Derek Jack, MD  Oxycodone HCl 20 MG TABS Take 1 tablet (20 mg total) by mouth every 4 (four) hours as needed. Patient taking differently: Take 1 tablet by mouth every 4 (four) hours as needed (for pain).  02/17/19  Yes Derek Jack, MD  pantoprazole (PROTONIX) 40 MG tablet Take 1 tablet (40 mg total) by mouth 2 (two) times daily. Take 30 minutes before breakfast 05/21/18  Yes Derek Jack, MD  prochlorperazine (COMPAZINE) 10 MG tablet TAKE 1 TABLET BY MOUTH EVERY 6 HOURS AS NEEDED FOR NAUSEA AND VOMITING Patient taking differently: Take 10 mg by mouth every 6 (six) hours as needed for nausea or vomiting.  01/14/19  Yes Lockamy, Randi L, NP-C  promethazine (PHENERGAN) 25 MG suppository Place 1 suppository (25 mg total) rectally every 6 (six) hours as needed for nausea or vomiting. 02/02/19  Yes Deno Etienne, DO  sucralfate (CARAFATE) 1 g tablet Take 1 g by mouth 4 (four) times daily -  with meals and at bedtime.  02/12/19 02/12/20 Yes [provider]  tamsulosin (FLOMAX) 0.4 MG CAPS capsule Take 1 capsule (0.4 mg total) by mouth at bedtime. 01/13/19  Yes Derek Jack, MD  chlorproMAZINE (THORAZINE) 25 MG tablet Take 1 tablet (25 mg total) by mouth 3 (three) times daily. 02/17/19   Derek Jack, MD    Family History Family History  Problem Relation Age of Onset  . Prostate cancer Father   . Colon cancer Neg Hx   . Colon polyps Neg Hx     Social History Social  History   Tobacco Use  . Smoking status: Current Some Day Smoker    Packs/day: 0.25    Types: Cigarettes  . Smokeless tobacco: Never Used  Substance Use Topics  . Alcohol use: No    Comment: hx heavy alcohol, hasn't drank in 22 yrs  . Drug use: Yes    Types: Marijuana     Allergies   Patient has no known allergies.   Review of Systems Review of Systems  Gastrointestinal: Positive for abdominal pain, constipation, nausea and vomiting.  Genitourinary: Positive for difficulty urinating.  All other systems reviewed and are negative.    Physical Exam Updated Vital Signs BP (!) 137/94   Pulse (!) 150   Temp 98.5 F (36.9 C) (Oral)   Resp 13   Ht 6\' 1"  (1.854 m)   Wt 78 kg   SpO2 99%   BMI 22.69 kg/m   Physical Exam Vitals signs and nursing  note reviewed.  Constitutional:      Appearance: He is well-developed. He is ill-appearing and diaphoretic.     Comments: Appears uncomfortable.  Diaphoretic.  HENT:     Head: Normocephalic and atraumatic.  Eyes:     Extraocular Movements: Extraocular movements intact.     Conjunctiva/sclera: Conjunctivae normal.     Pupils: Pupils are equal, round, and reactive to light.  Neck:     Musculoskeletal: Normal range of motion and neck supple.  Cardiovascular:     Rate and Rhythm: Regular rhythm. Tachycardia present.     Pulses: Normal pulses.     Comments: tachycardic up to 150. Pulmonary:     Effort: Pulmonary effort is normal. No respiratory distress.     Breath sounds: Normal breath sounds. No wheezing.  Abdominal:     General: There is no distension.     Palpations: Abdomen is soft. There is no mass.     Tenderness: There is abdominal tenderness. There is no guarding or rebound.     Comments: Generalized tenderness palpation of the abdomen.  PEG tube noted, no surrounding cellulitis or erythema.  No increased pain around the PEG tube.  Musculoskeletal: Normal range of motion.  Skin:    General: Skin is warm.      Capillary Refill: Capillary refill takes less than 2 seconds.  Neurological:     Mental Status: He is oriented to person, place, and time.      ED Treatments / Results  Labs (all labs ordered are listed, but only abnormal results are displayed) Labs Reviewed  COMPREHENSIVE METABOLIC PANEL - Abnormal; Notable for the following components:      Result Value   Chloride 97 (*)    Glucose, Bld 158 (*)    All other components within normal limits  CBC - Abnormal; Notable for the following components:   WBC 12.4 (*)    Hemoglobin 12.1 (*)    HCT 36.3 (*)    MCV 78.9 (*)    RDW 15.9 (*)    Platelets 494 (*)    All other components within normal limits  LACTIC ACID, PLASMA - Abnormal; Notable for the following components:   Lactic Acid, Venous 2.4 (*)    All other components within normal limits  LACTIC ACID, PLASMA - Abnormal; Notable for the following components:   Lactic Acid, Venous 2.2 (*)    All other components within normal limits  SARS CORONAVIRUS 2 (HOSPITAL ORDER, [facility])  LIPASE, BLOOD    EKG None  Radiology Ct Abdomen Pelvis W Contrast  Result Date: 02/18/2019 CLINICAL DATA:  Nausea and vomiting. Acute abdominal pain. History of chill cancer with of visual stent and recent feeding tube placement. EXAM: CT ABDOMEN AND PELVIS WITH CONTRAST TECHNIQUE: Multidetector CT imaging of the abdomen and pelvis was performed using the standard protocol following bolus administration of intravenous contrast. CONTRAST:  123mL OMNIPAQUE IOHEXOL 300 MG/ML  SOLN COMPARISON:  CT 02/02/2019 FINDINGS: Lower chest: No acute airspace disease or pleural effusion. Esophageal stent traverses the lower esophagus and gastroesophageal junction. Associated esophageal wall thickening. Fluid within the stent and distal esophagus. Proximal aspect of esophageal distention not included in the field of view. Hepatobiliary: No focal liver abnormality is seen. Cholesterol  gallstone in the gallbladder, no pericholecystic inflammation. Proximal common bile duct is normal in caliber, distal common bile duct flares to 8 mm, unchanged. Pancreas: No ductal dilatation or inflammation. Spleen: Normal in size without focal abnormality. Adrenals/Urinary Tract:  Left adrenal thickening without dominant nodule. Normal right adrenal gland. No hydronephrosis or perinephric edema. Homogeneous renal enhancement with symmetric excretion on delayed phase imaging. 14 mm cyst in the anterior lower left kidney. Indeterminate 10 mm low-density lesion in the posterior lower left kidney, image 42 series 2. This previously appear to represent simple cyst. Urinary bladder is physiologically distended without wall thickening. Stomach/Bowel: Esophageal stent traversing the distal esophagus into the stomach with associated esophageal wall thickening. Fluid/internal debris fills the stent as before. Esophagus proximal to the stent is fluid-filled, not entirely included in the field of view. Gastrostomy tube appropriately position in the stomach. Stomach is physiologically distended. No small bowel obstruction, wall thickening or inflammation. Large colonic stool burden with colonic tortuosity. Distal colon decompressed. Appendix not definitively visualized. Vascular/Lymphatic: Upper abdominal adenopathy, partially necrotic with enlarged lymph nodes in the gastrohepatic ligament, celiac axis and peripancreatic region. Lymph nodes have not significantly changed from prior exam. No progressive adenopathy. Normal caliber abdominal aorta. Portal vein is patent. Reproductive: Prostate is unremarkable. Other: No ascites. No free air or intra-abdominal fluid collection. Musculoskeletal: There are no acute or suspicious osseous abnormalities. No evidence of focal osseous lesion. Degenerative change in the lumbar spine and sacroiliac joints. IMPRESSION: 1. Stented esophageal carcinoma with unchanged position of the  esophageal stent. Fluid-filled esophagus proximal to the stent. Unchanged upper abdominal adenopathy from recent exam. 2. Large colonic stool burden with colonic tortuosity, can be seen with constipation. No evidence of bowel obstruction. 3. Cholelithiasis without evidence of cholecystitis. 4. Indeterminate 10 mm low-density lesion in the posterior lower left kidney, unchanged in size from prior with slight increased internal density. Additional simple cyst in the lower left kidney. Electronically Signed   By: Keith Rake M.D.   On: 02/18/2019 21:40    Procedures Procedures (including critical care time)  Medications Ordered in ED Medications  0.9 %  sodium chloride infusion (has no administration in time range)  HYDROmorphone (DILAUDID) injection 1 mg (has no administration in time range)  ondansetron (ZOFRAN-ODT) disintegrating tablet 4 mg (4 mg Oral Given 02/18/19 1727)  ondansetron (ZOFRAN) injection 4 mg (4 mg Intravenous Given 02/18/19 2032)  sodium chloride 0.9 % bolus 1,000 mL (0 mLs Intravenous Stopped 02/18/19 2214)  HYDROmorphone (DILAUDID) injection 1 mg (1 mg Intravenous Given 02/18/19 2044)  iohexol (OMNIPAQUE) 300 MG/ML solution 100 mL (100 mLs Intravenous Contrast Given 02/18/19 2108)  HYDROmorphone (DILAUDID) injection 1 mg (1 mg Intravenous Given 02/18/19 2202)  metoCLOPramide (REGLAN) injection 10 mg (10 mg Intravenous Given 02/18/19 2214)  sodium chloride 0.9 % bolus 1,000 mL (1,000 mLs Intravenous New Bag/Given 02/18/19 2320)     Initial Impression / Assessment and Plan / ED Course  I have reviewed the triage vital signs and the nursing notes.  Pertinent labs & imaging results that were available during my care of the patient were reviewed by me and considered in my medical decision making (see chart for details).        Presenting for evaluation nausea, vomiting, abdominal pain, and constipation.  Physical exam showed patient who appears ill and uncomfortable due to pain.  He  is diaphoretic.  He is tachycardic up to 150.  I am concerned about dehydration.  Also concerned about his 3 weeks without a bowel movement, consider bowel obstruction versus ileus versus constipation.  Patient with recent PEG tube placement, concern for intra-abdominal infection.  Will obtain labs, urine, CT abdomen pelvis.  Fluids, Zofran, and Dilaudid for symptom control. Case discussed with  attending, Dr. Sedonia Small agrees to plan.   Heart rate improving with fluids, however he remains tachycardic.  Patient had short improvement of nausea and pain with medications, however symptoms returned.  Will give another round of pain medication and Reglan for nausea.  CT shows constipation without obstruction.  No intra-abdominal infections.  Shows gallstones without cholecystitis, although I have low suspicion that this is the cause of patient's symptoms at this time considering his difficult GI course throughout his cancer treatment.  EKG without prolonged qt, can continue to tx nausea with antiemetics. Patient requesting to be transferred to [facility].  Will consult with GI with [facility].  Per chart review, patient's GI doctor told wife that they would see patient if patient is transferred to [facility].  Discussed with Dr. Bjorn Loser from [place] who will see pt in consult if pt is transferred, but pt will need to be accepted by hospitalist service.   On reassessment, patient remains tachycardic around 115.  Pain is remained constant despite treatment.  Nausea slightly improved.  Discussed with Dr. Marylee Floras from [facility]. Pt to be admitted to Heme-onc floor, but no bed available at this time. Expect no bed availability until the AM. When bed is available, [facility] requesting EDP call for update in status prior to transfer. Morning team admitting doctor will be Dr. Toy Care.   Pt signed out to Peggye Pitt, MD, for continued monitoring of pt.   Final Clinical Impressions(s) / ED Diagnoses   Final diagnoses:  Intractable  abdominal pain  Tachycardia  Chemotherapy induced nausea and vomiting    ED Discharge Orders    None       Franchot Heidelberg, PA-C 85/88/50 [id number]    Delora Fuel, MD 12/87/86 [phone]

## 2019-02-18 NOTE — ED Notes (Signed)
Date and time results received: 02/18/19 2225  Test: Lactic Critical Value: 2.2  Name of Provider Notified: [place]  Orders Received? Or Actions Taken?: n/a

## 2019-02-18 NOTE — ED Triage Notes (Signed)
Pt has esophageal cancer.  Vomiting, abd pain, sweating and chills.

## 2019-02-18 NOTE — Progress Notes (Signed)
Patient to cancer center for dressing change due to increased sweating.  Noted sweating on patients head.  Skin underneath port site dressing damp to touch.  Port flushed with good blood return noted x 3 with saline flushes before original dressing removed. Patient denied pain with flush.  No bruising or swelling noted at site. Dressing changed per policy.  Patient stated he did not take his blood pressure medication this morning, nausea and vomiting x 1 this morning.  Reviewed patients vital signs with Dr. Delton Coombes along with dressing change with good blood return before the original dressing was removed.  Verbal order nacl 539ml over one hour Dr. Delton Coombes.  Patient tolerated hydration with no complaints voiced.  Port site clean and dry with no bruising or swelling noted at site.  Good blood return noted after hydration.  Dressing intact.  No s/s of distress noted.  Vital signs stable with discharge.

## 2019-02-19 ENCOUNTER — Encounter (HOSPITAL_COMMUNITY): Payer: Medicaid Other

## 2019-02-19 ENCOUNTER — Emergency Department (HOSPITAL_COMMUNITY): Payer: Medicaid Other

## 2019-02-19 DIAGNOSIS — R109 Unspecified abdominal pain: Secondary | ICD-10-CM

## 2019-02-19 LAB — GLUCOSE, CAPILLARY: Glucose-Capillary: 126 mg/dL — ABNORMAL HIGH (ref 70–99)

## 2019-02-19 MED ORDER — PROMETHAZINE HCL 25 MG/ML IJ SOLN
12.5000 mg | Freq: Once | INTRAMUSCULAR | Status: AC
  Administered 2019-02-19: 12.5 mg via INTRAVENOUS
  Filled 2019-02-19: qty 1

## 2019-02-19 MED ORDER — TAMSULOSIN HCL 0.4 MG PO CAPS
0.4000 mg | ORAL_CAPSULE | Freq: Every day | ORAL | Status: DC
  Administered 2019-02-19: 0.4 mg via ORAL
  Filled 2019-02-19: qty 1

## 2019-02-19 MED ORDER — PANTOPRAZOLE SODIUM 40 MG IV SOLR
40.0000 mg | Freq: Once | INTRAVENOUS | Status: AC
  Administered 2019-02-19: 40 mg via INTRAVENOUS
  Filled 2019-02-19: qty 40

## 2019-02-19 MED ORDER — LORAZEPAM 2 MG/ML IJ SOLN
1.0000 mg | Freq: Once | INTRAMUSCULAR | Status: AC
  Administered 2019-02-19: 1 mg via INTRAVENOUS
  Filled 2019-02-19: qty 1

## 2019-02-19 MED ORDER — SUCRALFATE 1 G PO TABS
1.0000 g | ORAL_TABLET | Freq: Three times a day (TID) | ORAL | Status: DC
  Administered 2019-02-19: 23:00:00 1 g
  Filled 2019-02-19 (×2): qty 1

## 2019-02-19 MED ORDER — ONDANSETRON HCL 4 MG/2ML IJ SOLN
4.0000 mg | Freq: Four times a day (QID) | INTRAMUSCULAR | Status: DC | PRN
  Administered 2019-02-19: 4 mg via INTRAVENOUS
  Filled 2019-02-19: qty 2

## 2019-02-19 MED ORDER — BOOST / RESOURCE BREEZE PO LIQD CUSTOM: 1.0000 | Freq: Three times a day (TID) | ORAL | Status: DC

## 2019-02-19 MED ORDER — POTASSIUM CHLORIDE IN NACL 20-0.9 MEQ/L-% IV SOLN
INTRAVENOUS | Status: DC
  Administered 2019-02-19 – 2019-02-20 (×2): via INTRAVENOUS

## 2019-02-19 MED ORDER — ONDANSETRON HCL 4 MG PO TABS: 4.0000 mg | ORAL_TABLET | Freq: Four times a day (QID) | ORAL | Status: DC | PRN

## 2019-02-19 MED ORDER — ACETAMINOPHEN 650 MG RE SUPP: 650.0000 mg | Freq: Four times a day (QID) | RECTAL | Status: DC | PRN

## 2019-02-19 MED ORDER — ONDANSETRON HCL 4 MG/2ML IJ SOLN: 4.0000 mg | Freq: Four times a day (QID) | INTRAMUSCULAR | Status: DC | PRN

## 2019-02-19 MED ORDER — ACETAMINOPHEN 325 MG PO TABS: 650.0000 mg | ORAL_TABLET | Freq: Four times a day (QID) | ORAL | Status: DC | PRN

## 2019-02-19 MED ORDER — HYDROMORPHONE HCL 1 MG/ML IJ SOLN
1.0000 mg | INTRAMUSCULAR | Status: DC | PRN
  Administered 2019-02-19 – 2019-02-20 (×2): 1 mg via INTRAVENOUS
  Filled 2019-02-19 (×2): qty 1

## 2019-02-19 MED ORDER — LACTULOSE 10 GM/15ML PO SOLN: 20.0000 g | Freq: Two times a day (BID) | ORAL | Status: DC

## 2019-02-19 MED ORDER — INSULIN ASPART 100 UNIT/ML ~~LOC~~ SOLN: 0.0000 [IU] | Freq: Four times a day (QID) | SUBCUTANEOUS | Status: DC

## 2019-02-19 MED ORDER — SUCRALFATE 1 G PO TABS: 1.0000 g | ORAL_TABLET | Freq: Three times a day (TID) | ORAL | Status: DC

## 2019-02-19 MED ORDER — FAMOTIDINE IN NACL 20-0.9 MG/50ML-% IV SOLN
20.0000 mg | INTRAVENOUS | Status: DC
  Administered 2019-02-19: 21:00:00 20 mg via INTRAVENOUS
  Filled 2019-02-19: qty 50

## 2019-02-19 MED ORDER — FLEET ENEMA 7-19 GM/118ML RE ENEM: 1.0000 | ENEMA | Freq: Once | RECTAL | Status: DC

## 2019-02-19 MED ORDER — HYDROMORPHONE HCL 1 MG/ML IJ SOLN
1.0000 mg | INTRAMUSCULAR | Status: DC | PRN
  Administered 2019-02-19 (×5): 1 mg via INTRAVENOUS
  Filled 2019-02-19 (×5): qty 1

## 2019-02-19 MED ORDER — LACTULOSE 10 GM/15ML PO SOLN
20.0000 g | Freq: Two times a day (BID) | ORAL | Status: DC
  Filled 2019-02-19: qty 30

## 2019-02-19 MED ORDER — LORAZEPAM 2 MG/ML IJ SOLN
1.0000 mg | Freq: Once | INTRAMUSCULAR | Status: AC
  Administered 2019-02-19: 18:00:00 1 mg via INTRAVENOUS
  Filled 2019-02-19: qty 1

## 2019-02-19 MED ORDER — MAGNESIUM CITRATE PO SOLN: 1.0000 | Freq: Once | ORAL | Status: DC

## 2019-02-19 MED ORDER — MAGNESIUM CITRATE PO SOLN
1.0000 | Freq: Once | ORAL | Status: AC
  Administered 2019-02-19: 23:00:00 1
  Filled 2019-02-19: qty 296

## 2019-02-19 NOTE — Progress Notes (Signed)
Patient in the emergency room, chemotherapy continuous infusion pump was beeping low. ED nurse called to let us know to come and deaccess it. Portacath located right chest wall was  deaccessed and flushed with 79ml NS and 500U/45ml Heparin and needle removed intact.  Procedure without incident. Patient tolerated procedure well.

## 2019-02-19 NOTE — H&P (Signed)
History and Physical    Maxwell Henderson UXL:[id number] DOB: 1966-06-13 DOA: 02/18/2019  PCP: [name], FNP   Patient coming from: Home  I have personally briefly reviewed patient's old medical records in [place]  Chief Complaint: Nausea, vomiting abdominal pain  HPI: Maxwell Henderson is a 53 y.o. male with medical history significant for esophageal cancer, diabetes mellitus, hypertension, who presented to the ED with complaints of nausea vomiting abdominal pain.  Patient also reported that he had not had a bowel movement in 3 weeks.  At the time of my evaluation, patient is status post 1 mg Ativan sleeping, arousable,, answers some of my questions but not able to give me a lot of detail. He tells me he eats via his PEG tube.  And severity of vomiting has reduced in the ED. Per care everywhere, multiple recent hospitalizations at [facility], most recent- 6/22 to 6/26 and again 6/26 to 7/1 for similar complaints.  Patient has an esophageal stent, a PEG tube was placed 6/28.  Patient's gastroenterologist is at [facility] but his oncologist is here- Dr. Delton Coombes.  ED Course:  Initial tachycardia to 150s improved with fluids, WBC 12.4.  Initial lactic acidosis 2.4 >> 2.2 with IV fluids.  EKG sinus tachycardia at 102.  Normal lipase 28.  Unremarkable CMP.  IV Dilaudid, Ativan, 2 L bolus normal saline given in ED.  Abdominal CT showed stented esophageal carcinoma, fluid-filled esophagus proximal to stent, large stool burden. (Pls see detailed report).  With persistence of abdominal pain requiring multiple doses of IV Dilaudid, and family requesting transfer to [facility], [place] talked to gastroenterologist- Dr Bjorn Loser at [facility] who agreed with transfer, patient was accepted by hospitalist service-admitting doctor was Dr. Toy Care last night.  As of this afternoon after multiple calls patient is still awaiting bed availability, per EDP patient is unlikely to get to bed  tonight.  Patient is still on the transfer list, awaiting a bed.  Hospitalist to admit for intractable abdominal pain.  Review of Systems: As per HPI all other systems reviewed and negative.  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    Past Surgical History:  Procedure Laterality Date   ESOPHAGOGASTRODUODENOSCOPY (EGD) WITH PROPOFOL N/A 01/16/2017   Procedure: ESOPHAGOGASTRODUODENOSCOPY (EGD) WITH PROPOFOL;  Surgeon: [name], MD;  Location: [facility];  Service: Endoscopy;  Laterality: N/A;  730    IR FLUORO GUIDE PORT INSERTION RIGHT  02/13/2017   IR US GUIDE VASC ACCESS RIGHT  02/13/2017   lipoma removal     PORTA CATH INSERTION Right 02/13/2017   SAVORY DILATION N/A 01/16/2017   Procedure: SAVORY DILATION;  Surgeon: [name], MD;  Location: [facility];  Service: Endoscopy;  Laterality: N/A;     reports that he has been smoking cigarettes. He has been smoking about 0.25 packs per day. He has never used smokeless tobacco. He reports current drug use. Drug: Marijuana. He reports that he does not drink alcohol.  No Known Allergies  Family History  Problem Relation Age of Onset   Prostate cancer Father    Colon cancer Neg Hx    Colon polyps Neg Hx     Prior to Admission medications   Medication Sig Start Date End Date Taking? Authorizing Provider  Alpha-Lipoic Acid  600 MG CAPS Take 1 capsule (600 mg total) by mouth daily. 09/28/17  Yes Holley Bouche, NP  amLODipine (NORVASC) 10 MG tablet Take 1 tablet by mouth once daily 12/18/18  Yes Lockamy, Randi L, NP-C  B Complex-C (B-COMPLEX WITH VITAMIN C) tablet Take 1 tablet by mouth daily. 09/28/17  Yes Holley Bouche, NP  famotidine (PEPCID) 20 MG tablet Take 20 mg by mouth 2 (two) times daily.  02/12/19 02/12/20 Yes [provider]  Hyoscyamine Sulfate SL (LEVSIN/SL) 0.125 MG SUBL Place 1 tablet under the tongue every 4 (four) hours as needed. Patient taking differently: Place 1 tablet under the tongue every 4 (four) hours as needed (for cramping-abdominal pain).  12/06/18  Yes Derek Jack, MD  Lactulose 20 GM/30ML SOLN Take 30 mLs (20 g total) by mouth 2 (two) times a day. 02/17/19  Yes Derek Jack, MD  lidocaine (XYLOCAINE) 2 % solution Use as directed 15 mLs in the mouth or throat every 3 (three) hours as needed for mouth pain.  01/30/19  Yes [provider]  lidocaine-prilocaine (EMLA) cream Apply 1 application topically as needed. Patient taking differently: Apply 1 application topically as needed (for port access).  01/18/18  Yes Derek Jack, MD  Morphine Sulfate (MORPHINE CONCENTRATE) 10 mg / 0.5 ml concentrated solution Take 0.5 mLs (10 mg total) by mouth every 4 (four) hours as needed for severe pain. 12/31/18  Yes Derek Jack, MD  Oxycodone HCl 20 MG TABS Take 1 tablet (20 mg total) by mouth every 4 (four) hours as needed. Patient taking differently: Take 1 tablet by mouth every 4 (four) hours as needed (for pain).  02/17/19  Yes Derek Jack, MD  pantoprazole (PROTONIX) 40 MG tablet Take 1 tablet (40 mg total) by mouth 2 (two) times daily. Take 30 minutes before breakfast 05/21/18  Yes Derek Jack, MD  prochlorperazine (COMPAZINE) 10 MG tablet TAKE 1 TABLET BY MOUTH EVERY 6 HOURS AS NEEDED FOR NAUSEA AND VOMITING Patient taking differently: Take 10 mg by mouth every 6 (six) hours as needed for nausea or vomiting.  01/14/19  Yes Lockamy, Randi L, NP-C  promethazine (PHENERGAN) 25 MG suppository Place 1 suppository (25 mg total) rectally every 6 (six) hours as needed for nausea or vomiting. 02/02/19  Yes Deno Etienne, DO  sucralfate (CARAFATE) 1 g tablet Take 1 g by mouth 4 (four) times daily -  with meals and at bedtime.  02/12/19 02/12/20 Yes [provider]  tamsulosin (FLOMAX) 0.4 MG CAPS capsule Take 1 capsule (0.4 mg total) by mouth at bedtime. 01/13/19  Yes Derek Jack, MD  chlorproMAZINE (THORAZINE) 25 MG tablet Take 1 tablet (25 mg total) by mouth 3 (three) times daily. 02/17/19   Derek Jack, MD    Physical Exam: Vitals:   02/19/19 1300 02/19/19 1400 02/19/19 1630 02/19/19 1800  BP: (!) 142/100 125/81  107/73  Pulse: (!) 111 96  (!) 118  Resp: (!) 23 12 16 20   Temp:      TempSrc:      SpO2: 99% 100% 99% 98%  Weight:      Height:        Constitutional: Sleeping, arouses to touch, appears comfortable. Vitals:   02/19/19 1300 02/19/19 1400 02/19/19 1630 02/19/19 1800  BP: (!) 142/100 125/81  107/73  Pulse: (!) 111 96  (!) 118  Resp: (!) 23 12 16 20   Temp:      TempSrc:      SpO2: 99% 100% 99%  98%  Weight:      Height:       Eyes: PERRL, lids and conjunctivae normal ENMT: Mucous membranes are dry. Posterior pharynx clear of any exudate or lesions.  Neck: normal, supple, no masses, no thyromegaly Respiratory: clear to auscultation bilaterally, no wheezing, no crackles. Normal respiratory effort. No accessory muscle use.  Cardiovascular: Tachycardic, regular rate and rhythm, no murmurs / rubs / gallops. No extremity edema. 2+ pedal pulses.  Abdomen: PEG tube in place with clean dressing, mild tenderness on palpation epigastric area,, no masses palpated. No hepatosplenomegaly. Bowel sounds positive.  Musculoskeletal: no clubbing / cyanosis. No joint deformity upper and lower extremities. Good ROM, no contractures. Normal muscle tone.  Skin: no rashes, lesions, ulcers. No induration Neurologic: CN 2-12 grossly intact.  Strength 5/5 in all 4.  Psychiatric: Sleeping, arouses to voice, oriented x3, normal mood.   Labs on Admission: I have personally reviewed following labs and imaging studies  CBC: Recent Labs  Lab 02/17/19 0847 02/18/19 1829  WBC 14.2* 12.4*  NEUTROABS 11.0*  --   HGB 11.4*  12.1*  HCT 34.3* 36.3*  MCV 78.9* 78.9*  PLT 495* 545*   Basic Metabolic Panel: Recent Labs  Lab 02/17/19 0847 02/18/19 1829  NA 131* 138  K 3.8 3.9  CL 93* 97*  CO2 28 29  GLUCOSE 205* 158*  BUN 18 18  CREATININE 0.91 0.92  CALCIUM 9.3 9.6   Liver Function Tests: Recent Labs  Lab 02/17/19 0847 02/18/19 1829  AST 21 25  ALT 30 32  ALKPHOS 86 82  BILITOT 0.5 0.6  PROT 7.0 7.1  ALBUMIN 3.5 3.7   Recent Labs  Lab 02/18/19 1829  LIPASE 28    Radiological Exams on Admission: Ct Abdomen Pelvis W Contrast  Result Date: 02/18/2019 CLINICAL DATA:  Nausea and vomiting. Acute abdominal pain. History of chill cancer with of visual stent and recent feeding tube placement. EXAM: CT ABDOMEN AND PELVIS WITH CONTRAST TECHNIQUE: Multidetector CT imaging of the abdomen and pelvis was performed using the standard protocol following bolus administration of intravenous contrast. CONTRAST:  149mL OMNIPAQUE IOHEXOL 300 MG/ML  SOLN COMPARISON:  CT 02/02/2019 FINDINGS: Lower chest: No acute airspace disease or pleural effusion. Esophageal stent traverses the lower esophagus and gastroesophageal junction. Associated esophageal wall thickening. Fluid within the stent and distal esophagus. Proximal aspect of esophageal distention not included in the field of view. Hepatobiliary: No focal liver abnormality is seen. Cholesterol gallstone in the gallbladder, no pericholecystic inflammation. Proximal common bile duct is normal in caliber, distal common bile duct flares to 8 mm, unchanged. Pancreas: No ductal dilatation or inflammation. Spleen: Normal in size without focal abnormality. Adrenals/Urinary Tract: Left adrenal thickening without dominant nodule. Normal right adrenal gland. No hydronephrosis or perinephric edema. Homogeneous renal enhancement with symmetric excretion on delayed phase imaging. 14 mm cyst in the anterior lower left kidney. Indeterminate 10 mm low-density lesion in the posterior lower  left kidney, image 42 series 2. This previously appear to represent simple cyst. Urinary bladder is physiologically distended without wall thickening. Stomach/Bowel: Esophageal stent traversing the distal esophagus into the stomach with associated esophageal wall thickening. Fluid/internal debris fills the stent as before. Esophagus proximal to the stent is fluid-filled, not entirely included in the field of view. Gastrostomy tube appropriately position in the stomach. Stomach is physiologically distended. No small bowel obstruction, wall thickening or inflammation. Large colonic stool burden with colonic tortuosity. Distal colon decompressed. Appendix not definitively visualized. Vascular/Lymphatic: Upper abdominal adenopathy,  partially necrotic with enlarged lymph nodes in the gastrohepatic ligament, celiac axis and peripancreatic region. Lymph nodes have not significantly changed from prior exam. No progressive adenopathy. Normal caliber abdominal aorta. Portal vein is patent. Reproductive: Prostate is unremarkable. Other: No ascites. No free air or intra-abdominal fluid collection. Musculoskeletal: There are no acute or suspicious osseous abnormalities. No evidence of focal osseous lesion. Degenerative change in the lumbar spine and sacroiliac joints. IMPRESSION: 1. Stented esophageal carcinoma with unchanged position of the esophageal stent. Fluid-filled esophagus proximal to the stent. Unchanged upper abdominal adenopathy from recent exam. 2. Large colonic stool burden with colonic tortuosity, can be seen with constipation. No evidence of bowel obstruction. 3. Cholelithiasis without evidence of cholecystitis. 4. Indeterminate 10 mm low-density lesion in the posterior lower left kidney, unchanged in size from prior with slight increased internal density. Additional simple cyst in the lower left kidney. Electronically Signed   By: Keith Rake M.D.   On: 02/18/2019 21:40    EKG: Independently reviewed.   Sinus tachycardia, rate 102.  QTc 438.  No significant change compared to prior.  Assessment/Plan Active Problems:   Intractable abdominal pain  Intractable abdominal pain-with vomiting and nausea.  With lactic acidosis 2.4 >> 2.2.  Meeting SIRS criteria on admission.  Likely related to esophageal cancer requiring stenting and recently PEG tube placement.  No CT with contrast shows stented esophageal carcinoma, unchanged esophageal stent position, fluid-filled esophagus, large colonic stool burden.  WBC 12.4. 2L bolus given in ED. -IV Zofran PRN -1 mg Dilaudid Q4H PRN - NPO - N/s + 20 KCL 100cc/hr x 15hrs -Will hold pharmacologic DVT prophylaxis in case procedures are needed -IV famotidine 20 daily - BMP, CBC a.m  Constipation likely from opioids. Abd CT shows large colonic stool burden without obstruction. -Fleet enema x 1, magnesium citrate x1 -Continue home lactulose in a.m.  Esophageal cancer-on chemotherapy, follows with Dr. Delton Coombes, had stent placement of mass 01/30/2019, had problems with stent placements, nausea and vomiting with development of abdominal pain, and subsequently had PEG tube placed 02/09/2019.  Per notes 02/19/2019, patient requested for referral to [facility] medical oncology for further care, for which he had an appointment. -IV Dilaudid for now, hold home morphine 10mg  and oxycodone 20 mg Q4H PRN - I bottle boost TID -Nutrition consult for PEG tube  Hypertension-soft to stable. -Hold home Norvasc for now.  Diabetes mellitus-random glucose 158.  Not on medications. - SSI  V as part of routine health screening  DVT prophylaxis: SCDS Code Status: Full Family Communication: None at bedside Disposition Plan: Per rounding team Consults called: None Admission status: Obs, tele   Bethena Roys MD [facility]  02/19/2019, 7:10 PM

## 2019-02-19 NOTE — ED Provider Notes (Signed)
Patient waiting for bed assignment at [facility].  Night has been uneventful.   Delora Fuel, MD 44/03/47 [phone]

## 2019-02-19 NOTE — ED Notes (Signed)
[facility] call with update.  Still no bed but will call again latter this evening with updatae.

## 2019-02-19 NOTE — ED Notes (Addendum)
PT c/o nausea/pain/anxiety EDP made aware and new orders entered

## 2019-02-19 NOTE — ED Notes (Signed)
[facility] called stating that they still do not have a bed available at this time. States hopefully after shift change. RN Ginger made aware.

## 2019-02-19 NOTE — ED Notes (Signed)
Called [facility] to check on status of bed. Per Coordinator, still no bed available..  Waiting on Discharges and will call when a bed is available for him.  Nurse and EDP informed.

## 2019-02-19 NOTE — ED Notes (Signed)
Nursing from Cancer center was called at this time to come to ED if available to d/c chemo infusion and pump due to completion. Barnetta Chapel, RN from cancer center here in the ED at this time and d/c chemo and d/c of portacath needle with dressing applied. Still awaiting bed at [facility] accepting by a Dr. Truddie Crumble. Will call PAL line at [facility] again to see about a possible time for transfer.

## 2019-02-20 DIAGNOSIS — R109 Unspecified abdominal pain: Secondary | ICD-10-CM | POA: Diagnosis not present

## 2019-02-20 LAB — BASIC METABOLIC PANEL
Anion gap: 10 (ref 5–15)
BUN: 15 mg/dL (ref 6–20)
CO2: 25 mmol/L (ref 22–32)
Calcium: 8.4 mg/dL — ABNORMAL LOW (ref 8.9–10.3)
Chloride: 99 mmol/L (ref 98–111)
Creatinine, Ser: 0.76 mg/dL (ref 0.61–1.24)
GFR calc Af Amer: >60 mL/min (ref >60)
GFR calc non Af Amer: >60 mL/min (ref >60)
Glucose, Bld: 118 mg/dL — ABNORMAL HIGH (ref 70–99)
Potassium: 3.7 mmol/L (ref 3.5–5.1)
Sodium: 134 mmol/L — ABNORMAL LOW (ref 135–145)

## 2019-02-20 LAB — CBC
HCT: 29.9 % — ABNORMAL LOW (ref 39.0–52.0)
Hemoglobin: 9.9 g/dL — ABNORMAL LOW (ref 13.0–17.0)
MCH: 26.2 pg (ref 26.0–34.0)
MCHC: 33.1 g/dL (ref 30.0–36.0)
MCV: 79.1 fL — ABNORMAL LOW (ref 80.0–100.0)
Platelets: 386 10*3/uL (ref 150–400)
RBC: 3.78 MIL/uL — ABNORMAL LOW (ref 4.22–5.81)
RDW: 14.9 % (ref 11.5–15.5)
WBC: 7.5 10*3/uL (ref 4.0–10.5)
nRBC: 0 % (ref 0.0–0.2)

## 2019-02-20 LAB — GLUCOSE, CAPILLARY
Glucose-Capillary: 115 mg/dL — ABNORMAL HIGH (ref 70–99)
Glucose-Capillary: 127 mg/dL — ABNORMAL HIGH (ref 70–99)

## 2019-02-20 NOTE — Progress Notes (Signed)
Call from [facility] stating that bed was ready [facility] bed 3, [employer]) Accepting MD, Ronne Binning. Set up transport request with [place] and was informed that they would call back with an ETA. Number to call report to nurse is [phone]. Pt is aware of bed assignment and is in stable condition at this time. Will continue to monitor.

## 2019-02-20 NOTE — Discharge Summary (Signed)
Maxwell Henderson, is a 53 y.o. male  DOB May 14, 1966  MRN [id number].  Admission date:  02/18/2019  Admitting Physician  Bethena Roys, MD  Discharge Date:  02/20/2019   Primary MD  House, Deliah Goody, FNP  Recommendations for primary care physician for things to follow:   Going to [facility] bed 3, [employer]) ------call [phone]   Admission Diagnosis  Tachycardia [R00.0] Chemotherapy induced nausea and vomiting [R11.2, T45.1X5A] Intractable abdominal pain [R10.9]   Discharge Diagnosis  Tachycardia [R00.0] Chemotherapy induced nausea and vomiting [R11.2, T45.1X5A] Intractable abdominal pain [R10.9]    Active Problems:   Intractable abdominal pain      Past Medical History:  Diagnosis Date  . Diabetes mellitus without complication ([place])   . Hypertension   . 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 ESOPHAGOGASTRODUODENOSCOPY (EGD) WITH PROPOFOL N/A 01/16/2017   Procedure: ESOPHAGOGASTRODUODENOSCOPY (EGD) WITH PROPOFOL;  Surgeon: [name], MD;  Location: [facility];  Service: Endoscopy;  Laterality: N/A;  730   . IR FLUORO GUIDE PORT INSERTION RIGHT  02/13/2017  . IR US GUIDE VASC ACCESS RIGHT  02/13/2017  . lipoma removal    . PORTA CATH INSERTION Right 02/13/2017  . SAVORY DILATION N/A 01/16/2017   Procedure: SAVORY DILATION;  Surgeon: [name], MD;  Location: [facility];  Service: Endoscopy;  Laterality: N/A;       HPI  from the history and physical done on the day of admission:    Chief Complaint: Nausea, vomiting abdominal pain  HPI: Maxwell Henderson is a 53 y.o. male with medical history significant for esophageal cancer, diabetes mellitus, hypertension, who presented to the ED with complaints of nausea vomiting abdominal pain.   Patient also reported that he had not had a bowel movement in 3 weeks.  At the time of my evaluation, patient is status post 1 mg Ativan sleeping, arousable,, answers some of my questions but not able to give me a lot of detail. He tells me he eats via his PEG tube.  And severity of vomiting has reduced in the ED. Per care everywhere, multiple recent hospitalizations at [facility], most recent- 6/22 to 6/26 and again 6/26 to 7/1 for similar complaints.  Patient has an esophageal stent, a PEG tube was placed 6/28.  Patient's gastroenterologist is at [facility] but his oncologist is here- Dr. Delton Coombes.  ED Course:  Initial tachycardia to 150s improved with fluids, WBC 12.4.  Initial lactic acidosis 2.4 >> 2.2 with IV fluids.  EKG sinus tachycardia at 102.  Normal lipase 28.  Unremarkable CMP.  IV Dilaudid, Ativan, 2 L bolus normal saline given in ED.  Abdominal CT showed stented esophageal carcinoma, fluid-filled esophagus proximal to stent, large stool burden. (Pls see detailed report).  With persistence of abdominal pain requiring multiple doses of IV Dilaudid, and family requesting transfer to [facility],  EDP talked to gastroenterologist- Dr Bjorn Loser at [facility] who agreed with transfer, patient was accepted by hospitalist service-admitting doctor was Dr. Toy Care last night.  As of this afternoon after multiple calls patient is still awaiting bed availability, per EDP patient is unlikely to get to bed tonight.  Patient is still on the transfer list, awaiting a bed.  Hospitalist to admit for intractable abdominal pain.     Hospital Course:   Intractable abdominal pain-with vomiting and nausea.    Suspect opiate induced constipation is playing a role ... Patient has  lactic acidosis 2.4 >> 2.2.  Met SIRS criteria on admission.  Likely related to esophageal cancer with poor intake and dehydration risk .   CT with contrast shows stented esophageal carcinoma, unchanged esophageal stent  position, fluid-filled esophagus, large colonic stool burden.  WBC 12.4. 2L - -IV Zofran PRN -1 mg Dilaudid Q4H PRN - NPO -Continue IV saline solution --IV famotidine 20 daily   Constipation likely from opioids. Abd CT shows large colonic stool burden without obstruction. -Fleet enema x 1, magnesium citrate x1 -Advised to increase laxatives including lactulose  Esophageal cancer-  requiring stenting and recently PEG tube placement---on chemotherapy, previously followed with Dr. Delton Coombes, had stent placement of mass 01/30/2019, had problems with stent placements, nausea and vomiting with development of abdominal pain, and subsequently had PEG tube placed 02/09/2019.   Patient is in the process of transferring his oncology care to [facility] medical oncology for further care, for which he had an appointment. -IV Dilaudid for now, hold home morphine '10mg'$  and oxycodone 20 mg Q4H PRN - I bottle boost TID   Hypertension-soft to stable. -Hold home Norvasc for now.  Diabetes mellitus-random glucose 158.  Not on medications. - SSI  DVT prophylaxis: SCDS Code Status: Full Family Communication: None at bedside , patient is alert and coherent Disposition Plan:  Patient has had most of recent GI work-up previously at [facility], also patient recently is transferring his oncologic care to [facility], for appropriate use of continued T of care patient will be transferred to [facility] for further GI and oncology evaluation   Discharge Condition: Stable  Follow UP--GI and oncology at [place] and Activity recommendation:  As advised  Discharge Instructions    Discharge Instructions    Call MD for:  difficulty breathing, headache or visual disturbances   Complete by: As directed    Call MD for:  persistant nausea and vomiting   Complete by: As directed    Call MD for:  severe uncontrolled pain   Complete by: As  directed    Call MD for:  temperature >100.4   Complete by: As directed    Diet - low sodium heart healthy   Complete by: As directed    Discharge instructions   Complete by: As directed    1)You have significant constipation----need to increase and adjust your laxative 2)You are being transferred to [facility] bed 3, [place]) ------ for further gastroenterology and oncology evaluation   Increase activity slowly   Complete by: As directed       Discharge Medications     Allergies as of 02/20/2019   No Known Allergies     Medication List    TAKE these medications   Alpha-Lipoic Acid 600 MG Caps Take 1 capsule (600 mg total) by mouth daily.   amLODipine 10 MG tablet Commonly known as:  NORVASC Take 1 tablet by mouth once daily   B-complex with vitamin C tablet Take 1 tablet by mouth daily.   chlorproMAZINE 25 MG tablet Commonly known as: THORAZINE Take 1 tablet (25 mg total) by mouth 3 (three) times daily.   famotidine 20 MG tablet Commonly known as: PEPCID Take 20 mg by mouth 2 (two) times daily.   Hyoscyamine Sulfate SL 0.125 MG Subl Commonly known as: Levsin/SL Place 1 tablet under the tongue every 4 (four) hours as needed. What changed: reasons to take this   Lactulose 20 GM/30ML Soln Take 30 mLs (20 g total) by mouth 2 (two) times a day.   lidocaine 2 % solution Commonly known as: XYLOCAINE Use as directed 15 mLs in the mouth or throat every 3 (three) hours as needed for mouth pain.   lidocaine-prilocaine cream Commonly known as: EMLA Apply 1 application topically as needed. What changed: reasons to take this   morphine CONCENTRATE 10 mg / 0.5 ml concentrated solution Take 0.5 mLs (10 mg total) by mouth every 4 (four) hours as needed for severe pain.   Oxycodone HCl 20 MG Tabs Take 1 tablet (20 mg total) by mouth every 4 (four) hours as needed. What changed: reasons to take this   pantoprazole 40 MG tablet Commonly known  as: PROTONIX Take 1 tablet (40 mg total) by mouth 2 (two) times daily. Take 30 minutes before breakfast   prochlorperazine 10 MG tablet Commonly known as: COMPAZINE TAKE 1 TABLET BY MOUTH EVERY 6 HOURS AS NEEDED FOR NAUSEA AND VOMITING What changed: See the new instructions.   promethazine 25 MG suppository Commonly known as: PHENERGAN Place 1 suppository (25 mg total) rectally every 6 (six) hours as needed for nausea or vomiting.   sucralfate 1 g tablet Commonly known as: CARAFATE Take 1 g by mouth 4 (four) times daily -  with meals and at bedtime.   tamsulosin 0.4 MG Caps capsule Commonly known as: Flomax Take 1 capsule (0.4 mg total) by mouth at bedtime.       Major procedures and Radiology Reports - PLEASE review detailed and final reports for all details, in brief -   Dg Chest 2 View  Result Date: 02/02/2019 CLINICAL DATA:  Epigastric pain EXAM: CHEST - 2 VIEW COMPARISON:  12/10/2017 chest radiograph. FINDINGS: Right internal jugular Port-A-Cath terminates in the lower third of the SVC. Lower esophageal stent in place without narrowing. Stable cardiomediastinal silhouette with normal heart size. No pneumothorax. No pleural effusion. Lungs appear clear, with no acute consolidative airspace disease and no pulmonary edema. IMPRESSION: No active cardiopulmonary disease. Lower esophageal stent noted without narrowing. Electronically Signed   By: Ilona Sorrel M.D.   On: 02/02/2019 09:11   Ct Abdomen Pelvis W Contrast  Result Date: 02/18/2019 CLINICAL DATA:  Nausea and vomiting. Acute abdominal pain. History of chill cancer with of visual stent and recent feeding tube placement. EXAM: CT ABDOMEN AND PELVIS WITH CONTRAST TECHNIQUE: Multidetector CT imaging of the abdomen and pelvis was performed using the standard protocol following bolus administration of intravenous contrast. CONTRAST:  141m OMNIPAQUE IOHEXOL 300 MG/ML  SOLN COMPARISON:  CT 02/02/2019 FINDINGS: Lower chest: No acute  airspace disease or pleural effusion. Esophageal stent traverses the lower esophagus and gastroesophageal junction. Associated esophageal wall thickening. Fluid within the stent and distal esophagus. Proximal aspect of esophageal distention not included in the field of view. Hepatobiliary: No focal liver abnormality is seen. Cholesterol gallstone in the gallbladder, no pericholecystic inflammation. Proximal common  bile duct is normal in caliber, distal common bile duct flares to 8 mm, unchanged. Pancreas: No ductal dilatation or inflammation. Spleen: Normal in size without focal abnormality. Adrenals/Urinary Tract: Left adrenal thickening without dominant nodule. Normal right adrenal gland. No hydronephrosis or perinephric edema. Homogeneous renal enhancement with symmetric excretion on delayed phase imaging. 14 mm cyst in the anterior lower left kidney. Indeterminate 10 mm low-density lesion in the posterior lower left kidney, image 42 series 2. This previously appear to represent simple cyst. Urinary bladder is physiologically distended without wall thickening. Stomach/Bowel: Esophageal stent traversing the distal esophagus into the stomach with associated esophageal wall thickening. Fluid/internal debris fills the stent as before. Esophagus proximal to the stent is fluid-filled, not entirely included in the field of view. Gastrostomy tube appropriately position in the stomach. Stomach is physiologically distended. No small bowel obstruction, wall thickening or inflammation. Large colonic stool burden with colonic tortuosity. Distal colon decompressed. Appendix not definitively visualized. Vascular/Lymphatic: Upper abdominal adenopathy, partially necrotic with enlarged lymph nodes in the gastrohepatic ligament, celiac axis and peripancreatic region. Lymph nodes have not significantly changed from prior exam. No progressive adenopathy. Normal caliber abdominal aorta. Portal vein is patent. Reproductive: Prostate is  unremarkable. Other: No ascites. No free air or intra-abdominal fluid collection. Musculoskeletal: There are no acute or suspicious osseous abnormalities. No evidence of focal osseous lesion. Degenerative change in the lumbar spine and sacroiliac joints. IMPRESSION: 1. Stented esophageal carcinoma with unchanged position of the esophageal stent. Fluid-filled esophagus proximal to the stent. Unchanged upper abdominal adenopathy from recent exam. 2. Large colonic stool burden with colonic tortuosity, can be seen with constipation. No evidence of bowel obstruction. 3. Cholelithiasis without evidence of cholecystitis. 4. Indeterminate 10 mm low-density lesion in the posterior lower left kidney, unchanged in size from prior with slight increased internal density. Additional simple cyst in the lower left kidney. Electronically Signed   By: Keith Rake M.D.   On: 02/18/2019 21:40   Ct Abdomen Pelvis W Contrast  Result Date: 02/02/2019 CLINICAL DATA:  Esophageal stent placed three days ago for esophageal cancer. Patient with pain, nausea, vomiting, sweating and chills after eating. Esophageal cancer diagnosed June 2018. EXAM: CT ABDOMEN AND PELVIS WITH CONTRAST TECHNIQUE: Multidetector CT imaging of the abdomen and pelvis was performed using the standard protocol following bolus administration of intravenous contrast. CONTRAST:  141m OMNIPAQUE IOHEXOL 300 MG/ML  SOLN COMPARISON:  12/13/2018 and 08/23/2018 FINDINGS: Lower chest: Lung bases are normal. Evidence of patient's distal esophageal stent extending into the gastric cardia new since the prior exam. Superior extent of the stent not seen. The visualized portion of the stent appears patent containing fluid and minimal air. Hepatobiliary: Liver and biliary tree are normal. Cholesterol gallstone present. Pancreas: Normal. Spleen: Normal. Adrenals/Urinary Tract: Adrenal glands are normal. Kidneys normal in size without hydronephrosis or nephrolithiasis. Couple  stable small left renal cyst. Ureters and bladder are normal. Stomach/Bowel: Interval placement of esophageal stent extending into the gastric cardia which appears patent. The superior extent of this stent is not visualized on this exam although the visualized portion appears patent. There is thickening of the distal esophagus and gastric cardia adjacent the stent compatible with patient's known distal esophageal cancer. Persistent adenopathy the some of which are necrotic in the region of the gastrohepatic ligament, celiac axis and peripancreatic region as some of these nodes may be slightly larger and some less well-defined. Small bowel is normal. Appendix is normal. Mild decompression of the descending and sigmoid colon with subtle  wall thickening likely within normal although could be seen with mild acute colitis. Remainder the colon is normal. Vascular/Lymphatic: Vascular structures within normal. Adenopathy as described above. Reproductive: Normal. Other: No significant free fluid. Musculoskeletal: Unremarkable. IMPRESSION: 1. Interval placement of esophageal stent over the distal esophagus extending into the gastric cardia which appears patent. No adjacent free air or focal fluid collection. Note that the superior extent of the stent is not visualized on this exam. Wall thickening of the distal esophagus and gastric cardia compatible with known esophageal cancer. Stable to slight worsening of adenopathy over the upper abdomen as described. 2. Mild decompression wall thickening of the descending colon and sigmoid colon which may be within normal although could be seen with mild acute colitis. 3.  Mild cholelithiasis. 4.  Several small renal cysts unchanged. Electronically Signed   By: Marin Olp M.D.   On: 02/02/2019 09:44   Dg Abd Acute 2+v W 1v Chest  Result Date: 02/03/2019 CLINICAL DATA:  Esophageal stent placement.  Abdominal pain. EXAM: DG ABDOMEN ACUTE W/ 1V CHEST COMPARISON:  Radiographs of February 02, 2019. FINDINGS: There is no evidence of dilated bowel loops or free intraperitoneal air. Stable position of distal esophageal stent. Right internal jugular Port-A-Cath is unchanged in position. No radiopaque calculi or other significant radiographic abnormality is seen. Heart size and mediastinal contours are within normal limits. Both lungs are clear. IMPRESSION: No evidence of bowel obstruction or ileus. Stable position of distal esophageal stent. No acute cardiopulmonary disease. Electronically Signed   By: Marijo Conception M.D.   On: 02/03/2019 12:23   Micro Results   Recent Results (from the past 240 hour(s))  SARS Coronavirus 2 (CEPHEID - Performed in [place] hospital lab), Hosp Order     Status: None   Collection Time: 02/18/19 10:10 PM   Specimen: Nasopharyngeal Swab  Result Value Ref Range Status   SARS Coronavirus 2 NEGATIVE NEGATIVE Final    Comment: (NOTE) If result is NEGATIVE SARS-CoV-2 target nucleic acids are NOT DETECTED. The SARS-CoV-2 RNA is generally detectable in upper and lower  respiratory specimens during the acute phase of infection. The lowest  concentration of SARS-CoV-2 viral copies this assay can detect is 250  copies / mL. A negative result does not preclude SARS-CoV-2 infection  and should not be used as the sole basis for treatment or other  patient management decisions.  A negative result may occur with  improper specimen collection / handling, submission of specimen other  than nasopharyngeal swab, presence of viral mutation(s) within the  areas targeted by this assay, and inadequate number of viral copies  (<250 copies / mL). A negative result must be combined with clinical  observations, patient history, and epidemiological information. If result is POSITIVE SARS-CoV-2 target nucleic acids are DETECTED. The SARS-CoV-2 RNA is generally detectable in upper and lower  respiratory specimens dur ing the acute phase of infection.  Positive  results  are indicative of active infection with SARS-CoV-2.  Clinical  correlation with patient history and other diagnostic information is  necessary to determine patient infection status.  Positive results do  not rule out bacterial infection or co-infection with other viruses. If result is PRESUMPTIVE POSTIVE SARS-CoV-2 nucleic acids MAY BE PRESENT.   A presumptive positive result was obtained on the submitted specimen  and confirmed on repeat testing.  While 2019 novel coronavirus  (SARS-CoV-2) nucleic acids may be present in the submitted sample  additional confirmatory testing may be necessary for epidemiological  and / or clinical management purposes  to differentiate between  SARS-CoV-2 and other Sarbecovirus currently known to infect humans.  If clinically indicated additional testing with an alternate test  methodology [phone]) is advised. The SARS-CoV-2 RNA is generally  detectable in upper and lower respiratory sp ecimens during the acute  phase of infection. The expected result is Negative. Fact Sheet for Patients:  StrictlyIdeas.no Fact Sheet for Healthcare Providers: BankingDealers.co.za This test is not yet approved or cleared by the [place] FDA and has been authorized for detection and/or diagnosis of SARS-CoV-2 by FDA under an Emergency Use Authorization (EUA).  This EUA will remain in effect (meaning this test can be used) for the duration of the COVID-19 declaration under Section 564(b)(1) of the Act, 21 U.S.C. section 360bbb-3(b)(1), unless the authorization is terminated or revoked sooner. Performed at [facility], [address] [place], [place] [address]        Today   Subjective    Maxwell Henderson today has no new complaints, nausea and abd pain persist,          No fever  Or chills   Patient has had most of recent GI work-up previously at [facility], also patient recently is transferring his  oncologic care to [facility], for appropriate use of continued T of care patient will be transferred to [facility] for further GI and oncology evaluation     Patient has been seen and examined prior to discharge   Objective   Blood pressure 113/85, pulse (!) 114, temperature 98.6 F (37 C), resp. rate 17, height '6\' 1"'$  (1.854 m), weight 77.2 kg, SpO2 99 %.   Intake/Output Summary (Last 24 hours) at 02/20/2019 0856 Last data filed at 02/20/2019 0500 Gross per 24 hour  Intake 1863.72 ml  Output 1200 ml  Net 663.72 ml    Exam Gen:- Awake Alert, no acute distress  HEENT:- [place].AT, No sclera icterus Neck-Supple Neck,No JVD,.  Lungs-  CTAB , good air movement bilaterally  CV- S1, S2 normal, regular Abd-  +ve B.Sounds, Abd Soft, PEG tube in situ, abdominal discomfort without rebound or guarding  extremity/Skin:- No  edema,   good pulses Psych-affect is appropriate, oriented x3 Neuro-no new focal deficits, no tremors    Data Review   CBC w Diff:  Lab Results  Component Value Date   WBC 7.5 02/20/2019   HGB 9.9 (L) 02/20/2019   HCT 29.9 (L) 02/20/2019   PLT 386 02/20/2019   LYMPHOPCT 14 02/17/2019   MONOPCT 7 02/17/2019   EOSPCT 1 02/17/2019   BASOPCT 0 02/17/2019    CMP:  Lab Results  Component Value Date   NA 134 (L) 02/20/2019   K 3.7 02/20/2019   CL 99 02/20/2019   CO2 25 02/20/2019   BUN 15 02/20/2019   CREATININE 0.76 02/20/2019   PROT 7.1 02/18/2019   ALBUMIN 3.7 02/18/2019   BILITOT 0.6 02/18/2019   ALKPHOS 82 02/18/2019   AST 25 02/18/2019   ALT 32 02/18/2019  . Patient has had most of recent GI work-up previously at [facility], also patient recently is transferring his oncologic care to [facility], for appropriate use of continued T of care patient will be transferred to [facility] for further GI and oncology evaluation    Total Discharge time is about 33 minutes  Roxan Hockey M.D on 02/20/2019  at 8:56 AM  Go to [email or web address] -  for contact info  [facility] - Office  [phone]

## 2019-02-20 NOTE — Progress Notes (Signed)
Going to [facility] bed 3, [employer]) ------call [phone]

## 2019-02-20 NOTE — Progress Notes (Signed)
Report called to Tamika, nurse at [facility] there by their transport team to [facility] Bed 3

## 2019-02-21 LAB — HIV ANTIBODY (ROUTINE TESTING W REFLEX): HIV Screen 4th Generation wRfx: NONREACTIVE

## 2019-02-24 MED ORDER — AMLODIPINE BESYLATE 5 MG PO TABS
5.00 | ORAL_TABLET | ORAL | Status: DC
Start: 2019-02-24 — End: 2019-02-24

## 2019-02-24 MED ORDER — LACTULOSE 10 GM/15ML PO SOLN
20.00 | ORAL | Status: DC
Start: ? — End: 2019-02-24

## 2019-02-24 MED ORDER — FAMOTIDINE 40 MG/5ML PO SUSR
20.00 | ORAL | Status: DC
Start: 2019-02-23 — End: 2019-02-24

## 2019-02-24 MED ORDER — OXYCODONE HCL 5 MG/5ML PO SOLN
5.00 | ORAL | Status: DC
Start: ? — End: 2019-02-24

## 2019-02-24 MED ORDER — DEXTROSE 10 % IV SOLN
125.00 | INTRAVENOUS | Status: DC
Start: ? — End: 2019-02-24

## 2019-02-24 MED ORDER — POLYETHYLENE GLYCOL 3350 17 G PO PACK
17.00 | PACK | ORAL | Status: DC
Start: 2019-02-23 — End: 2019-02-24

## 2019-02-24 MED ORDER — ACETAMINOPHEN 325 MG PO TABS
650.00 | ORAL_TABLET | ORAL | Status: DC
Start: ? — End: 2019-02-24

## 2019-02-24 MED ORDER — PANTOPRAZOLE SODIUM 40 MG IV SOLR
40.00 | INTRAVENOUS | Status: DC
Start: 2019-02-23 — End: 2019-02-24

## 2019-02-24 MED ORDER — GLUCOSE 40 % PO GEL
15.00 | ORAL | Status: DC
Start: ? — End: 2019-02-24

## 2019-02-24 MED ORDER — BISACODYL 10 MG RE SUPP
10.00 | RECTAL | Status: DC
Start: ? — End: 2019-02-24

## 2019-02-24 MED ORDER — DOCUSATE SODIUM 100 MG PO CAPS
200.00 | ORAL_CAPSULE | ORAL | Status: DC
Start: 2019-02-24 — End: 2019-02-24

## 2019-02-24 MED ORDER — SUCRALFATE 1 GM/10ML PO SUSP
1.00 | ORAL | Status: DC
Start: 2019-02-23 — End: 2019-02-24

## 2019-02-24 MED ORDER — ONDANSETRON HCL 4 MG/2ML IJ SOLN
4.00 | INTRAMUSCULAR | Status: DC
Start: ? — End: 2019-02-24

## 2019-02-24 MED ORDER — ENOXAPARIN SODIUM 40 MG/0.4ML ~~LOC~~ SOLN
40.00 | SUBCUTANEOUS | Status: DC
Start: 2019-02-24 — End: 2019-02-24

## 2019-02-24 MED ORDER — OXYCODONE HCL 5 MG/5ML PO SOLN
10.00 | ORAL | Status: DC
Start: ? — End: 2019-02-24

## 2019-02-24 MED ORDER — GENERIC EXTERNAL MEDICATION
Status: DC
Start: 2019-02-23 — End: 2019-02-24

## 2019-02-25 ENCOUNTER — Ambulatory Visit (HOSPITAL_COMMUNITY): Payer: Medicaid Other | Admitting: Hematology

## 2019-02-25 ENCOUNTER — Other Ambulatory Visit (HOSPITAL_COMMUNITY): Payer: Medicaid Other

## 2019-02-25 ENCOUNTER — Ambulatory Visit (HOSPITAL_COMMUNITY): Payer: Medicaid Other

## 2019-02-26 ENCOUNTER — Other Ambulatory Visit (HOSPITAL_COMMUNITY): Payer: Self-pay | Admitting: Emergency Medicine

## 2019-02-26 ENCOUNTER — Telehealth (HOSPITAL_COMMUNITY): Payer: Self-pay | Admitting: *Deleted

## 2019-02-26 ENCOUNTER — Other Ambulatory Visit (HOSPITAL_COMMUNITY): Payer: Self-pay | Admitting: *Deleted

## 2019-02-26 MED ORDER — MORPHINE SULFATE (CONCENTRATE) 10 MG /0.5 ML PO SOLN: 10.0000 mg | ORAL | 0 refills | Status: DC | PRN

## 2019-02-27 ENCOUNTER — Encounter (HOSPITAL_COMMUNITY): Payer: Medicaid Other

## 2019-03-03 ENCOUNTER — Inpatient Hospital Stay (HOSPITAL_COMMUNITY): Payer: Medicaid Other

## 2019-03-03 ENCOUNTER — Other Ambulatory Visit: Payer: Self-pay

## 2019-03-03 ENCOUNTER — Other Ambulatory Visit (HOSPITAL_COMMUNITY): Payer: Self-pay | Admitting: *Deleted

## 2019-03-03 ENCOUNTER — Telehealth (HOSPITAL_COMMUNITY): Payer: Self-pay | Admitting: *Deleted

## 2019-03-03 VITALS — BP 107/73 | HR 91 | Temp 97.5°F | Resp 16 | Wt 173.0 lb

## 2019-03-03 DIAGNOSIS — C159 Malignant neoplasm of esophagus, unspecified: Secondary | ICD-10-CM

## 2019-03-03 DIAGNOSIS — Z5111 Encounter for antineoplastic chemotherapy: Secondary | ICD-10-CM | POA: Diagnosis not present

## 2019-03-03 DIAGNOSIS — C158 Malignant neoplasm of overlapping sites of esophagus: Secondary | ICD-10-CM

## 2019-03-03 LAB — CBC WITH DIFFERENTIAL/PLATELET
Abs Immature Granulocytes: 0.01 10*3/uL (ref 0.00–0.07)
Basophils Absolute: 0 10*3/uL (ref 0.0–0.1)
Basophils Relative: 1 %
Eosinophils Absolute: 0.1 10*3/uL (ref 0.0–0.5)
Eosinophils Relative: 2 %
HCT: 31.4 % — ABNORMAL LOW (ref 39.0–52.0)
Hemoglobin: 10.2 g/dL — ABNORMAL LOW (ref 13.0–17.0)
Immature Granulocytes: 0 %
Lymphocytes Relative: 28 %
Lymphs Abs: 1.7 10*3/uL (ref 0.7–4.0)
MCH: 26 pg (ref 26.0–34.0)
MCHC: 32.5 g/dL (ref 30.0–36.0)
MCV: 80.1 fL (ref 80.0–100.0)
Monocytes Absolute: 0.5 10*3/uL (ref 0.1–1.0)
Monocytes Relative: 8 %
Neutro Abs: 3.6 10*3/uL (ref 1.7–7.7)
Neutrophils Relative %: 61 %
Platelets: 291 10*3/uL (ref 150–400)
RBC: 3.92 MIL/uL — ABNORMAL LOW (ref 4.22–5.81)
RDW: 15.9 % — ABNORMAL HIGH (ref 11.5–15.5)
WBC: 5.9 10*3/uL (ref 4.0–10.5)
nRBC: 0 % (ref 0.0–0.2)

## 2019-03-03 LAB — COMPREHENSIVE METABOLIC PANEL
ALT: 23 U/L (ref 0–44)
AST: 20 U/L (ref 15–41)
Albumin: 3.4 g/dL — ABNORMAL LOW (ref 3.5–5.0)
Alkaline Phosphatase: 75 U/L (ref 38–126)
Anion gap: 8 (ref 5–15)
BUN: 14 mg/dL (ref 6–20)
CO2: 26 mmol/L (ref 22–32)
Calcium: 8.8 mg/dL — ABNORMAL LOW (ref 8.9–10.3)
Chloride: 99 mmol/L (ref 98–111)
Creatinine, Ser: 0.92 mg/dL (ref 0.61–1.24)
GFR calc Af Amer: >60 mL/min (ref >60)
GFR calc non Af Amer: >60 mL/min (ref >60)
Glucose, Bld: 218 mg/dL — ABNORMAL HIGH (ref 70–99)
Potassium: 4.2 mmol/L (ref 3.5–5.1)
Sodium: 133 mmol/L — ABNORMAL LOW (ref 135–145)
Total Bilirubin: 0.3 mg/dL (ref 0.3–1.2)
Total Protein: 6.6 g/dL (ref 6.5–8.1)

## 2019-03-03 MED ORDER — SODIUM CHLORIDE 0.9 % IV SOLN
2320.0000 mg/m2 | INTRAVENOUS | Status: DC
  Administered 2019-03-03: 5000 mg via INTRAVENOUS
  Filled 2019-03-03: qty 100

## 2019-03-03 MED ORDER — ATROPINE SULFATE 1 MG/ML IJ SOLN
INTRAMUSCULAR | Status: AC
  Filled 2019-03-03: qty 1

## 2019-03-03 MED ORDER — SODIUM CHLORIDE 0.9 % IV SOLN
400.0000 mg/m2 | Freq: Once | INTRAVENOUS | Status: AC
  Administered 2019-03-03: 864 mg via INTRAVENOUS
  Filled 2019-03-03: qty 43.2

## 2019-03-03 MED ORDER — SODIUM CHLORIDE 0.9 % IV SOLN
144.0000 mg/m2 | Freq: Once | INTRAVENOUS | Status: AC
  Administered 2019-03-03: 320 mg via INTRAVENOUS
  Filled 2019-03-03: qty 2

## 2019-03-03 MED ORDER — PALONOSETRON HCL INJECTION 0.25 MG/5ML
0.2500 mg | Freq: Once | INTRAVENOUS | Status: AC
  Administered 2019-03-03: 0.25 mg via INTRAVENOUS

## 2019-03-03 MED ORDER — OXYCODONE HCL 20 MG PO TABS: 1.0000 | ORAL_TABLET | ORAL | 0 refills | Status: DC | PRN

## 2019-03-03 MED ORDER — PALONOSETRON HCL INJECTION 0.25 MG/5ML
INTRAVENOUS | Status: AC
  Filled 2019-03-03: qty 5

## 2019-03-03 MED ORDER — SODIUM CHLORIDE 0.9% FLUSH
10.0000 mL | INTRAVENOUS | Status: DC | PRN
  Administered 2019-03-03: 10 mL
  Filled 2019-03-03: qty 10

## 2019-03-03 MED ORDER — ATROPINE SULFATE 1 MG/ML IJ SOLN
1.0000 mg | Freq: Once | INTRAMUSCULAR | Status: AC
  Administered 2019-03-03: 1 mg via INTRAVENOUS

## 2019-03-03 MED ORDER — SODIUM CHLORIDE 0.9 % IV SOLN
10.0000 mg | Freq: Once | INTRAVENOUS | Status: AC
  Administered 2019-03-03: 10 mg via INTRAVENOUS
  Filled 2019-03-03: qty 10

## 2019-03-03 MED ORDER — FLUOROURACIL CHEMO INJECTION 2.5 GM/50ML
400.0000 mg/m2 | Freq: Once | INTRAVENOUS | Status: AC
  Administered 2019-03-03: 850 mg via INTRAVENOUS
  Filled 2019-03-03: qty 17

## 2019-03-03 MED ORDER — SODIUM CHLORIDE 0.9 % IV SOLN
Freq: Once | INTRAVENOUS | Status: AC
  Administered 2019-03-03: 10:00:00 via INTRAVENOUS

## 2019-03-03 NOTE — Telephone Encounter (Signed)
Aging,Disability[facility] requested information on this patient. Tried to fax several times and had several failed attempts. Called and spoke with Linus Orn and she stated to mail the information with the envelope enclosed in the paperwork that was sent to the clinic. Verbalized understanding and the paperwork was sent out today via mail.

## 2019-03-03 NOTE — Progress Notes (Signed)
0905 lab work reviewed with Dr. Delton Coombes and patient approved for treatment today.  Tracey Harries tolerated FOLFIRI without incident or complaint. VSS. Discharged in satisfactory condition with 5FU infusion through home infusion pump.

## 2019-03-03 NOTE — Patient Instructions (Signed)
[facility] Discharge Instructions for Patients Receiving Chemotherapy   Beginning January 23rd 2017 lab work for the [facility] will be done in the  Main lab at [facility] on 1st floor. If you have a lab appointment with the [place] please come in thru the  Main Entrance and check in at the main information desk   Today you received the following chemotherapy agents Irinotecan, Leucovorin, and 5FU  To help prevent nausea and vomiting after your treatment, we encourage you to take your nausea medication    If you develop nausea and vomiting, or diarrhea that is not controlled by your medication, call the clinic.  The clinic phone number is [phone]. Office hours are Monday-Friday 8:30am-5:00pm.  BELOW ARE SYMPTOMS THAT SHOULD BE REPORTED IMMEDIATELY:  *FEVER GREATER THAN 101.0 F  *CHILLS WITH OR WITHOUT FEVER  NAUSEA AND VOMITING THAT IS NOT CONTROLLED WITH YOUR NAUSEA MEDICATION  *UNUSUAL SHORTNESS OF BREATH  *UNUSUAL BRUISING OR BLEEDING  TENDERNESS IN MOUTH AND THROAT WITH OR WITHOUT PRESENCE OF ULCERS  *URINARY PROBLEMS  *BOWEL PROBLEMS  UNUSUAL RASH Items with * indicate a potential emergency and should be followed up as soon as possible. If you have an emergency after office hours please contact your primary care physician or go to the nearest emergency department.  Please call the clinic during office hours if you have any questions or concerns.   You may also contact the Patient Navigator at [phone] should you have any questions or need assistance in obtaining follow up care.      Resources For Cancer Patients and their Caregivers ? American Cancer Society: Can assist with transportation, wigs, general needs, runs Look Good Feel Better.        480-092-7289 ? Cancer Care: Provides financial assistance, online support groups, medication/co-pay assistance.  1-800-813-HOPE 310-839-9835) ? [facility] Assists [place] cancer patients and their families through emotional , educational and financial support.  703-530-6039 ? [place] DSS Where to apply for food stamps, Medicaid and utility assistance. 403-135-4099 ? [employer]: Transportation to medical appointments. 234-572-0247 ? Social Security Administration: May apply for disability if have a Stage IV cancer. [phone] ? [employer], Disability and Transit Services: Assists with nutrition, care and transit needs. [phone]

## 2019-03-05 ENCOUNTER — Other Ambulatory Visit: Payer: Self-pay

## 2019-03-05 ENCOUNTER — Encounter (HOSPITAL_COMMUNITY): Payer: Self-pay

## 2019-03-05 ENCOUNTER — Inpatient Hospital Stay (HOSPITAL_COMMUNITY): Payer: Medicaid Other

## 2019-03-05 VITALS — BP 117/80 | HR 90 | Temp 97.9°F | Resp 18

## 2019-03-05 DIAGNOSIS — C159 Malignant neoplasm of esophagus, unspecified: Secondary | ICD-10-CM

## 2019-03-05 DIAGNOSIS — Z5111 Encounter for antineoplastic chemotherapy: Secondary | ICD-10-CM | POA: Diagnosis not present

## 2019-03-05 MED ORDER — HEPARIN SOD (PORK) LOCK FLUSH 100 UNIT/ML IV SOLN
500.0000 [IU] | Freq: Once | INTRAVENOUS | Status: AC | PRN
  Administered 2019-03-05: 500 [IU]

## 2019-03-05 MED ORDER — SODIUM CHLORIDE 0.9% FLUSH
10.0000 mL | INTRAVENOUS | Status: DC | PRN
  Administered 2019-03-05: 10 mL
  Filled 2019-03-05: qty 10

## 2019-03-05 NOTE — Progress Notes (Signed)
Maxwell Henderson tolerated 5FU pump well without complaints or incident. 5FU pump discontinued with portacath flushed per protocol then de-accessed. VSS Pt discharged self ambulatory in satisfactory condition

## 2019-03-05 NOTE — Patient Instructions (Signed)
[place] at [facility] Discharge Instructions  5FU pump discontinued with portacath fllushed today. Follow-up as scheduled. Call clinic for any questions or concerns   Thank you for choosing [place] at [facility] to provide your oncology and hematology care.  To afford each patient quality time with our provider, please arrive at least 15 minutes before your scheduled appointment time.   If you have a lab appointment with the [place] please come in thru the  Main Entrance and check in at the main information desk  You need to re-schedule your appointment should you arrive 10 or more minutes late.  We strive to give you quality time with our providers, and arriving late affects you and other patients whose appointments are after yours.  Also, if you no show three or more times for appointments you may be dismissed from the clinic at the providers discretion.     Again, thank you for choosing [facility].  Our hope is that these requests will decrease the amount of time that you wait before being seen by our physicians.       _____________________________________________________________  Should you have questions after your visit to [facility], please contact our office at [phone] between the hours of 8:00 a.m. and 4:30 p.m.  Voicemails left after 4:00 p.m. will not be returned until the following business day.  For prescription refill requests, have your pharmacy contact our office and allow 72 hours.    Cancer Center Support Programs:   > Cancer Support Group  2nd Tuesday of the month 1pm-2pm, Journey Room

## 2019-05-03 ENCOUNTER — Emergency Department (HOSPITAL_COMMUNITY): Payer: Medicaid Other

## 2019-05-03 ENCOUNTER — Encounter (HOSPITAL_COMMUNITY): Payer: Self-pay

## 2019-05-03 ENCOUNTER — Observation Stay (HOSPITAL_COMMUNITY)
Admission: EM | Admit: 2019-05-03 | Discharge: 2019-05-04 | Disposition: A | Discharge status: Home / Self Care | Payer: Medicaid Other | Attending: Internal Medicine | Admitting: Internal Medicine

## 2019-05-03 ENCOUNTER — Other Ambulatory Visit: Payer: Self-pay

## 2019-05-03 DIAGNOSIS — R1033 Periumbilical pain: Principal | ICD-10-CM | POA: Insufficient documentation

## 2019-05-03 DIAGNOSIS — R109 Unspecified abdominal pain: Secondary | ICD-10-CM | POA: Diagnosis present

## 2019-05-03 DIAGNOSIS — C159 Malignant neoplasm of esophagus, unspecified: Secondary | ICD-10-CM | POA: Diagnosis not present

## 2019-05-03 DIAGNOSIS — Z79899 Other long term (current) drug therapy: Secondary | ICD-10-CM | POA: Diagnosis not present

## 2019-05-03 DIAGNOSIS — R1010 Upper abdominal pain, unspecified: Secondary | ICD-10-CM | POA: Diagnosis present

## 2019-05-03 DIAGNOSIS — Z23 Encounter for immunization: Secondary | ICD-10-CM | POA: Insufficient documentation

## 2019-05-03 DIAGNOSIS — E119 Type 2 diabetes mellitus without complications: Secondary | ICD-10-CM | POA: Insufficient documentation

## 2019-05-03 DIAGNOSIS — Z20828 Contact with and (suspected) exposure to other viral communicable diseases: Secondary | ICD-10-CM | POA: Diagnosis not present

## 2019-05-03 DIAGNOSIS — I10 Essential (primary) hypertension: Secondary | ICD-10-CM | POA: Diagnosis present

## 2019-05-03 DIAGNOSIS — F17210 Nicotine dependence, cigarettes, uncomplicated: Secondary | ICD-10-CM | POA: Insufficient documentation

## 2019-05-03 DIAGNOSIS — K219 Gastro-esophageal reflux disease without esophagitis: Secondary | ICD-10-CM | POA: Diagnosis present

## 2019-05-03 DIAGNOSIS — Z8501 Personal history of malignant neoplasm of esophagus: Secondary | ICD-10-CM | POA: Insufficient documentation

## 2019-05-03 DIAGNOSIS — R112 Nausea with vomiting, unspecified: Secondary | ICD-10-CM | POA: Diagnosis not present

## 2019-05-03 DIAGNOSIS — Z931 Gastrostomy status: Secondary | ICD-10-CM

## 2019-05-03 LAB — CBC
HCT: 34.3 % — ABNORMAL LOW (ref 39.0–52.0)
Hemoglobin: 11.2 g/dL — ABNORMAL LOW (ref 13.0–17.0)
MCH: 25.7 pg — ABNORMAL LOW (ref 26.0–34.0)
MCHC: 32.7 g/dL (ref 30.0–36.0)
MCV: 78.7 fL — ABNORMAL LOW (ref 80.0–100.0)
Platelets: 309 10*3/uL (ref 150–400)
RBC: 4.36 MIL/uL (ref 4.22–5.81)
RDW: 15 % (ref 11.5–15.5)
WBC: 6.6 10*3/uL (ref 4.0–10.5)
nRBC: 0 % (ref 0.0–0.2)

## 2019-05-03 LAB — CBC WITH DIFFERENTIAL/PLATELET
Abs Immature Granulocytes: 0.02 10*3/uL (ref 0.00–0.07)
Basophils Absolute: 0 10*3/uL (ref 0.0–0.1)
Basophils Relative: 0 %
Eosinophils Absolute: 0.1 10*3/uL (ref 0.0–0.5)
Eosinophils Relative: 1 %
HCT: 35.5 % — ABNORMAL LOW (ref 39.0–52.0)
Hemoglobin: 12 g/dL — ABNORMAL LOW (ref 13.0–17.0)
Immature Granulocytes: 0 %
Lymphocytes Relative: 26 %
Lymphs Abs: 1.8 10*3/uL (ref 0.7–4.0)
MCH: 26.6 pg (ref 26.0–34.0)
MCHC: 33.8 g/dL (ref 30.0–36.0)
MCV: 78.7 fL — ABNORMAL LOW (ref 80.0–100.0)
Monocytes Absolute: 0.5 10*3/uL (ref 0.1–1.0)
Monocytes Relative: 8 %
Neutro Abs: 4.7 10*3/uL (ref 1.7–7.7)
Neutrophils Relative %: 65 %
Platelets: 353 10*3/uL (ref 150–400)
RBC: 4.51 MIL/uL (ref 4.22–5.81)
RDW: 15.1 % (ref 11.5–15.5)
WBC: 7.2 10*3/uL (ref 4.0–10.5)
nRBC: 0 % (ref 0.0–0.2)

## 2019-05-03 LAB — COMPREHENSIVE METABOLIC PANEL
ALT: 29 U/L (ref 0–44)
AST: 23 U/L (ref 15–41)
Albumin: 4.1 g/dL (ref 3.5–5.0)
Alkaline Phosphatase: 82 U/L (ref 38–126)
Anion gap: 13 (ref 5–15)
BUN: 19 mg/dL (ref 6–20)
CO2: 23 mmol/L (ref 22–32)
Calcium: 9.5 mg/dL (ref 8.9–10.3)
Chloride: 99 mmol/L (ref 98–111)
Creatinine, Ser: 0.9 mg/dL (ref 0.61–1.24)
GFR calc Af Amer: >60 mL/min (ref >60)
GFR calc non Af Amer: >60 mL/min (ref >60)
Glucose, Bld: 185 mg/dL — ABNORMAL HIGH (ref 70–99)
Potassium: 3.5 mmol/L (ref 3.5–5.1)
Sodium: 135 mmol/L (ref 135–145)
Total Bilirubin: 0.2 mg/dL — ABNORMAL LOW (ref 0.3–1.2)
Total Protein: 7.2 g/dL (ref 6.5–8.1)

## 2019-05-03 LAB — URINALYSIS, ROUTINE W REFLEX MICROSCOPIC
Bilirubin Urine: NEGATIVE
Glucose, UA: NEGATIVE mg/dL
Hgb urine dipstick: NEGATIVE
Ketones, ur: NEGATIVE mg/dL
Leukocytes,Ua: NEGATIVE
Nitrite: NEGATIVE
Protein, ur: NEGATIVE mg/dL
Specific Gravity, Urine: 1.039 — ABNORMAL HIGH (ref 1.005–1.030)
pH: 7 (ref 5.0–8.0)

## 2019-05-03 LAB — CREATININE, SERUM
Creatinine, Ser: 0.81 mg/dL (ref 0.61–1.24)
GFR calc Af Amer: >60 mL/min (ref >60)
GFR calc non Af Amer: >60 mL/min (ref >60)

## 2019-05-03 LAB — LIPASE, BLOOD: Lipase: 22 U/L (ref 11–51)

## 2019-05-03 LAB — SARS CORONAVIRUS 2 BY RT PCR (HOSPITAL ORDER, PERFORMED IN ~~LOC~~ HOSPITAL LAB): SARS Coronavirus 2: NEGATIVE

## 2019-05-03 MED ORDER — METHYLNALTREXONE BROMIDE 12 MG/0.6ML ~~LOC~~ SOLN
12.0000 mg | Freq: Once | SUBCUTANEOUS | Status: AC
  Administered 2019-05-03: 12 mg via SUBCUTANEOUS
  Filled 2019-05-03: qty 0.6

## 2019-05-03 MED ORDER — INFLUENZA VAC SPLIT QUAD 0.5 ML IM SUSY
0.5000 mL | PREFILLED_SYRINGE | INTRAMUSCULAR | Status: AC
  Administered 2019-05-04: 0.5 mL via INTRAMUSCULAR
  Filled 2019-05-03: qty 0.5

## 2019-05-03 MED ORDER — AMLODIPINE BESYLATE 5 MG PO TABS
10.0000 mg | ORAL_TABLET | Freq: Every day | ORAL | Status: DC
  Administered 2019-05-03 – 2019-05-04 (×2): 10 mg
  Filled 2019-05-03 (×2): qty 2

## 2019-05-03 MED ORDER — HYOSCYAMINE SULFATE 0.125 MG SL SUBL: 0.1250 mg | SUBLINGUAL_TABLET | SUBLINGUAL | Status: DC | PRN

## 2019-05-03 MED ORDER — ONDANSETRON HCL 4 MG/2ML IJ SOLN
4.0000 mg | Freq: Once | INTRAMUSCULAR | Status: AC
  Administered 2019-05-03: 4 mg via INTRAVENOUS
  Filled 2019-05-03: qty 2

## 2019-05-03 MED ORDER — KCL IN DEXTROSE-NACL 20-5-0.45 MEQ/L-%-% IV SOLN
INTRAVENOUS | Status: DC
  Administered 2019-05-03 – 2019-05-04 (×3): via INTRAVENOUS

## 2019-05-03 MED ORDER — HYDROMORPHONE HCL 1 MG/ML IJ SOLN
1.0000 mg | INTRAMUSCULAR | Status: DC | PRN
  Administered 2019-05-03 – 2019-05-04 (×3): 1 mg via INTRAVENOUS
  Filled 2019-05-03 (×3): qty 1

## 2019-05-03 MED ORDER — FAMOTIDINE 20 MG PO TABS
20.0000 mg | ORAL_TABLET | Freq: Two times a day (BID) | ORAL | Status: DC
  Administered 2019-05-03 – 2019-05-04 (×3): 20 mg
  Filled 2019-05-03 (×3): qty 1

## 2019-05-03 MED ORDER — ACETAMINOPHEN 650 MG RE SUPP: 650.0000 mg | Freq: Four times a day (QID) | RECTAL | Status: DC | PRN

## 2019-05-03 MED ORDER — IOHEXOL 300 MG/ML  SOLN
100.0000 mL | Freq: Once | INTRAMUSCULAR | Status: AC | PRN
  Administered 2019-05-03: 100 mL via INTRAVENOUS

## 2019-05-03 MED ORDER — OXYCODONE HCL 5 MG/5ML PO SOLN
20.0000 mg | ORAL | Status: DC
  Administered 2019-05-04 (×2): 20 mg via ORAL
  Filled 2019-05-03 (×2): qty 20

## 2019-05-03 MED ORDER — LACTULOSE 10 GM/15ML PO SOLN
20.0000 g | Freq: Two times a day (BID) | ORAL | Status: DC
  Administered 2019-05-03 – 2019-05-04 (×2): 20 g
  Filled 2019-05-03 (×8): qty 30

## 2019-05-03 MED ORDER — HYDROMORPHONE HCL 1 MG/ML IJ SOLN
1.0000 mg | Freq: Once | INTRAMUSCULAR | Status: AC
  Administered 2019-05-03: 1 mg via INTRAVENOUS
  Filled 2019-05-03: qty 1

## 2019-05-03 MED ORDER — TAMSULOSIN HCL 0.4 MG PO CAPS
0.4000 mg | ORAL_CAPSULE | Freq: Every day | ORAL | Status: DC
  Administered 2019-05-03: 0.4 mg via ORAL
  Filled 2019-05-03: qty 1

## 2019-05-03 MED ORDER — OXYCODONE HCL 5 MG/5ML PO SOLN
20.0000 mg | ORAL | Status: DC
  Administered 2019-05-03: 20 mg via ORAL
  Filled 2019-05-03 (×2): qty 20

## 2019-05-03 MED ORDER — OXYCODONE HCL 20 MG/ML PO CONC
20.0000 mg | ORAL | Status: AC
  Administered 2019-05-03 – 2019-05-04 (×3): 20 mg via ORAL
  Filled 2019-05-03 (×3): qty 1

## 2019-05-03 MED ORDER — ACETAMINOPHEN 325 MG PO TABS: 650.0000 mg | ORAL_TABLET | Freq: Four times a day (QID) | ORAL | Status: DC | PRN

## 2019-05-03 MED ORDER — SUCRALFATE 1 G PO TABS
1.0000 g | ORAL_TABLET | Freq: Three times a day (TID) | ORAL | Status: DC
  Administered 2019-05-03 – 2019-05-04 (×4): 1 g via ORAL
  Filled 2019-05-03 (×5): qty 1

## 2019-05-03 MED ORDER — PANTOPRAZOLE SODIUM 40 MG PO TBEC
40.0000 mg | DELAYED_RELEASE_TABLET | Freq: Two times a day (BID) | ORAL | Status: DC
  Administered 2019-05-03 – 2019-05-04 (×3): 40 mg via ORAL
  Filled 2019-05-03 (×3): qty 1

## 2019-05-03 MED ORDER — PROCHLORPERAZINE EDISYLATE 10 MG/2ML IJ SOLN: 10.0000 mg | Freq: Four times a day (QID) | INTRAMUSCULAR | Status: DC | PRN

## 2019-05-03 MED ORDER — ENOXAPARIN SODIUM 40 MG/0.4ML ~~LOC~~ SOLN
40.0000 mg | SUBCUTANEOUS | Status: DC
  Administered 2019-05-03 (×2): 40 mg via SUBCUTANEOUS
  Filled 2019-05-03 (×2): qty 0.4

## 2019-05-03 MED ORDER — LORAZEPAM 2 MG/ML IJ SOLN
1.0000 mg | Freq: Once | INTRAMUSCULAR | Status: AC
  Administered 2019-05-03: 1 mg via INTRAVENOUS
  Filled 2019-05-03: qty 1

## 2019-05-03 MED ORDER — LORAZEPAM 2 MG/ML IJ SOLN: 1.0000 mg | Freq: Four times a day (QID) | INTRAMUSCULAR | Status: DC | PRN

## 2019-05-03 NOTE — H&P (Signed)
History and Physical    Maxwell Henderson [id number] DOB: 11/30/1965 DOA: 05/03/2019  PCP: [name], FNP  Patient coming from: home  I have personally briefly reviewed patient's old medical records in [place]  Chief Complaint: abdominal pain  HPI: Maxwell Henderson is a 53 y.o. 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  Patient reports seeing his pain specialist via video conference approximately 4 days ago and at that time his pain was at baseline.  He reports that for the past 2 days he has had persistent severe pain in his periumbilical area that does not improve after taking pain medications.  He is not been having vomiting, but has been having dry heaving.  He is chronically constipated.  He is not had any fever or cough, no shortness of breath.  He has occasional dysuria, but none lately.  ED Course: On evaluation the emergency room, he is noted to be mildly tachycardic.  He is constantly moving around, trying to get comfortable but complains of severe pain.  Labs including CBC, chemistry and lipase are unremarkable.  CT of the abdomen pelvis performed that did not show any acute changes.  There was thickening of the distal esophagus.  Due to uncontrolled pain, he has been referred for admission.  Review of Systems:  General: Negative for fever, malaise, generalized weakness Chest: Negative for shortness of breath, cough, wheezing Cardiac: Negative for chest pain, palpitations GI: Positive for abdominal pain, dry heaving, negative for diarrhea All other systems reviewed and found to be negative.   Past Medical History:  Diagnosis Date  . Diabetes mellitus without complication ([place])   . Hypertension   . 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 ESOPHAGOGASTRODUODENOSCOPY (EGD) WITH PROPOFOL N/A 01/16/2017   Procedure: ESOPHAGOGASTRODUODENOSCOPY (EGD) WITH PROPOFOL;  Surgeon: [name], MD;  Location: [facility] ENDO SUITE;  Service: Endoscopy;  Laterality: N/A;  730   . IR FLUORO GUIDE PORT INSERTION RIGHT  02/13/2017  . IR US GUIDE VASC ACCESS RIGHT  02/13/2017  . lipoma removal    . PORTA CATH INSERTION Right 02/13/2017  . SAVORY DILATION N/A 01/16/2017   Procedure: SAVORY DILATION;  Surgeon: [name], MD;  Location: [facility] ENDO SUITE;  Service: Endoscopy;  Laterality: N/A;    Social History:  reports that he has been smoking cigarettes. He has been smoking about 0.25 packs per day. He has never used smokeless tobacco. He reports current drug use. Drug: Marijuana. He reports that he does not drink alcohol.  No Known Allergies  Family History  Problem Relation Age of Onset  . Prostate cancer Father   . Colon cancer Neg Hx   . Colon polyps Neg Hx     Prior to Admission medications   Medication Sig Start Date End Date Taking? Authorizing Provider  acetaminophen (TYLENOL) 325 MG tablet Take by mouth. 02/23/19   [provider]  Alpha-Lipoic Acid 600 MG CAPS Take 1 capsule (600 mg total) by mouth daily. 09/28/17   Holley Bouche, NP  amLODipine (NORVASC) 10 MG tablet Take 1 tablet by mouth once daily 12/18/18   Lockamy, Randi L, NP-C  B Complex-C (B-COMPLEX WITH VITAMIN C) tablet Take 1 tablet by mouth daily. 09/28/17  Holley Bouche, NP  chlorproMAZINE (THORAZINE) 25 MG tablet Take 1 tablet (25 mg total) by mouth 3 (three) times daily. 02/17/19   Derek Jack, MD  famotidine (PEPCID) 20 MG tablet Take 20 mg by mouth 2 (two) times daily.  02/12/19 02/12/20  [provider]  Hyoscyamine Sulfate SL (LEVSIN/SL) 0.125 MG SUBL Place 1 tablet under the tongue every 4 (four) hours as needed. Patient taking differently: Place 1 tablet under  the tongue every 4 (four) hours as needed (for cramping-abdominal pain).  12/06/18   Derek Jack, MD  Lactulose 20 GM/30ML SOLN Take 30 mLs (20 g total) by mouth 2 (two) times a day. 02/17/19   Derek Jack, MD  lidocaine (XYLOCAINE) 2 % solution Use as directed 15 mLs in the mouth or throat every 3 (three) hours as needed for mouth pain.  01/30/19   [provider]  lidocaine-prilocaine (EMLA) cream Apply 1 application topically as needed. Patient taking differently: Apply 1 application topically as needed (for port access).  01/18/18   Derek Jack, MD  Morphine Sulfate (MORPHINE CONCENTRATE) 10 mg / 0.5 ml concentrated solution Take 0.5 mLs (10 mg total) by mouth every 4 (four) hours as needed for severe pain. 02/26/19   Derek Jack, MD  Oxycodone HCl 20 MG TABS Take 1 tablet (20 mg total) by mouth every 4 (four) hours as needed. 03/03/19   Derek Jack, MD  pantoprazole (PROTONIX) 40 MG tablet Take 1 tablet (40 mg total) by mouth 2 (two) times daily. Take 30 minutes before breakfast 05/21/18   Derek Jack, MD  prochlorperazine (COMPAZINE) 10 MG tablet TAKE 1 TABLET BY MOUTH EVERY 6 HOURS AS NEEDED FOR NAUSEA AND VOMITING Patient taking differently: Take 10 mg by mouth every 6 (six) hours as needed for nausea or vomiting.  01/14/19   Glennie Isle, NP-C  promethazine (PHENERGAN) 25 MG suppository Place 1 suppository (25 mg total) rectally every 6 (six) hours as needed for nausea or vomiting. 02/02/19   Deno Etienne, DO  sucralfate (CARAFATE) 1 g tablet Take 1 g by mouth 4 (four) times daily -  with meals and at bedtime.  02/12/19 02/12/20  [provider]  tamsulosin (FLOMAX) 0.4 MG CAPS capsule Take 1 capsule (0.4 mg total) by mouth at bedtime. 01/13/19   Derek Jack, MD    Physical Exam: Vitals:   05/03/19 0315 05/03/19 0320 05/03/19 0445 05/03/19 0500  BP:   (!) 130/91 (!) 142/93  Pulse: 85 81    Resp:   18   Temp:      TempSrc:       SpO2: 97% 100%    Weight:      Height:        Constitutional: patient is constantly moving around in bed, trying to get comfortable, appears to be very uncomfortable Eyes: PERRL, lids and conjunctivae normal ENMT: Mucous membranes are moist. Posterior pharynx clear of any exudate or lesions.Normal dentition.  Neck: normal, supple, no masses, no thyromegaly Respiratory: clear to auscultation bilaterally, no wheezing, no crackles. Normal respiratory effort. No accessory muscle use.  Cardiovascular: Regular rate and rhythm, no murmurs / rubs / gallops. No extremity edema. 2+ pedal pulses. No carotid bruits.  Abdomen: diffusely tender, soft, no masses palpated. No hepatosplenomegaly. Bowel sounds positive. PEG tube in place Musculoskeletal: no clubbing / cyanosis. No joint deformity upper and lower extremities. Good ROM, no contractures. Normal muscle tone.  Skin: no rashes, lesions, ulcers. No induration Neurologic: CN 2-12 grossly intact. Sensation  intact, DTR normal. Strength 5/5 in all 4.  Psychiatric: anxious, uncomfortable   Labs on Admission: I have personally reviewed following labs and imaging studies  CBC: Recent Labs  Lab 05/03/19 0132  WBC 7.2  NEUTROABS 4.7  HGB 12.0*  HCT 35.5*  MCV 78.7*  PLT 0000000   Basic Metabolic Panel: Recent Labs  Lab 05/03/19 0132  NA 135  K 3.5  CL 99  CO2 23  GLUCOSE 185*  BUN 19  CREATININE 0.90  CALCIUM 9.5   GFR: Estimated Creatinine Clearance: 109.1 mL/min (by C-G formula based on SCr of 0.9 mg/dL). Liver Function Tests: Recent Labs  Lab 05/03/19 0132  AST 23  ALT 29  ALKPHOS 82  BILITOT 0.2*  PROT 7.2  ALBUMIN 4.1   Recent Labs  Lab 05/03/19 0132  LIPASE 22   No results for input(s): AMMONIA in the last 168 hours. Coagulation Profile: No results for input(s): INR, PROTIME in the last 168 hours. Cardiac Enzymes: No results for input(s): CKTOTAL, CKMB, CKMBINDEX, TROPONINI in the last 168 hours. BNP (last 3  results) No results for input(s): PROBNP in the last 8760 hours. HbA1C: No results for input(s): HGBA1C in the last 72 hours. CBG: No results for input(s): GLUCAP in the last 168 hours. Lipid Profile: No results for input(s): CHOL, HDL, LDLCALC, TRIG, CHOLHDL, LDLDIRECT in the last 72 hours. Thyroid Function Tests: No results for input(s): TSH, T4TOTAL, FREET4, T3FREE, THYROIDAB in the last 72 hours. Anemia Panel: No results for input(s): VITAMINB12, FOLATE, FERRITIN, TIBC, IRON, RETICCTPCT in the last 72 hours. Urine analysis:    Component Value Date/Time   COLORURINE YELLOW 05/03/2019 0325   APPEARANCEUR CLOUDY (A) 05/03/2019 0325   LABSPEC 1.039 (H) 05/03/2019 0325   PHURINE 7.0 05/03/2019 0325   GLUCOSEU NEGATIVE 05/03/2019 0325   HGBUR NEGATIVE 05/03/2019 0325   BILIRUBINUR NEGATIVE 05/03/2019 0325   KETONESUR NEGATIVE 05/03/2019 0325   PROTEINUR NEGATIVE 05/03/2019 0325   NITRITE NEGATIVE 05/03/2019 0325   LEUKOCYTESUR NEGATIVE 05/03/2019 0325    Radiological Exams on Admission: Ct Abdomen Pelvis W Contrast  Result Date: 05/03/2019 CLINICAL DATA:  Acute abdominal pain EXAM: CT ABDOMEN AND PELVIS WITH CONTRAST TECHNIQUE: Multidetector CT imaging of the abdomen and pelvis was performed using the standard protocol following bolus administration of intravenous contrast. CONTRAST:  172mL OMNIPAQUE IOHEXOL 300 MG/ML  SOLN COMPARISON:  02/18/2019 FINDINGS: LOWER CHEST: There is no basilar pleural or apical pericardial effusion. HEPATOBILIARY: The hepatic contours and density are normal. The distal common bile duct is mildly dilated. No intrahepatic biliary dilatation. The gallbladder is normal. PANCREAS: The pancreatic parenchymal contours are normal and there is no ductal dilatation. There is no peripancreatic fluid collection. SPLEEN: Normal. ADRENALS/URINARY TRACT: --Adrenal glands: Normal. --Right kidney/ureter: No hydronephrosis, nephroureterolithiasis, perinephric stranding or  solid renal mass. --Left kidney/ureter: No hydronephrosis, nephroureterolithiasis, perinephric stranding or solid renal mass. --Urinary bladder: Normal for degree of distention STOMACH/BOWEL: --Stomach/Duodenum: Mild wall thickening of the distal esophagus. There is a gastrostomy tube with tip in the stomach. Course of the duodenum is normal. --Small bowel: No dilatation or inflammation. --Colon: No focal abnormality. --Appendix: Normal. VASCULAR/LYMPHATIC: Normal course and caliber of the major abdominal vessels. No abdominal or pelvic lymphadenopathy. REPRODUCTIVE: Normal prostate size with symmetric seminal vesicles. MUSCULOSKELETAL. No bony spinal canal stenosis or focal osseous abnormality. OTHER: None. IMPRESSION: 1. No acute abdominal or pelvic abnormality. 2. Mild wall thickening of the distal esophagus, likely secondary to known/treated esophageal adenocarcinoma, similar to prior study. 3. Mildly  dilated distal common bile duct. Electronically Signed   By: Ulyses Jarred M.D.   On: 05/03/2019 03:26   Assessment/Plan Active Problems:   Essential hypertension   Esophageal cancer (HCC)   Abdominal pain   GERD (gastroesophageal reflux disease)   PEG (percutaneous endoscopic gastrostomy) status ([place])     1. Abdominal pain.  Unclear etiology, possibly related to his underlying malignant process.  His CT scan is unrevealing.  Basic labs did not show any acute findings.  He is on chronic oxycodone for pain management.  We will continue his oxycodone regimen and supplement with PRN IV Dilaudid.  The patient is requesting transfer to [facility] since he receives majority of his care over there.  He reports being scheduled for a procedure on Monday although he is not able to tell me which procedure.  I have discussed the case with Dr. Katharine Look at [facility] and it appears that patient will be having an EGD with dilation on Monday.  Dr. Katharine Look has accepted the patient in transfer,  although currently there are no beds available.  He will be admitted to [facility] for the time being until a bed becomes available. 2. Esophageal cancer.  He normally gets his care at [facility].  His wife says that he has chemo treatment scheduled for next Tuesday. 3. GERD.  Continue on PPI 4. Hypertension.  He is chronically on amlodipine.  We will continue the same.  Blood pressure currently stable. 5. Constipation.  No reports of significant stool burden on CT imaging.  He did receive a dose of Relistor in the emergency room and has had a bowel movement since then.  This did not have any significant impact on his pain.  We will continue on his home dose of lactulose.  DVT prophylaxis: lovenox  Code Status: full code  Family Communication: discussed with wife at the bedside  Disposition Plan: awaiting transfer to [place] called:   Admission status: observation, medsurg   Kathie Dike MD [facility]   If 7PM-7AM, please contact night-coverage [email or web address]   05/03/2019, 9:25 AM

## 2019-05-03 NOTE — ED Triage Notes (Signed)
Esophageal cancer, having abd pain x 4 days.

## 2019-05-03 NOTE — ED Provider Notes (Signed)
[facility] EMERGENCY DEPARTMENT Provider Note   CSN: [id number] Arrival date & time: 05/03/19  [id number]   Time seen 1:45 AM  History   Chief Complaint Chief Complaint  Patient presents with  . Abdominal Pain    Cancer pt   Level 5 caveat due to distress  HPI Maxwell Henderson is a 53 y.o. male.     HPI wife states he was diagnosed with esophageal cancer about 2 years ago.  She states he is followed at [facility] by Dr. Truddie Crumble and he is getting chemotherapy every 2 weeks.  She states they removed an esophageal stent about 2 months ago and they inserted a PEG at that time.  She states he started having trouble swallowing so they have been using the PEG tube for nutrition.  He has had no other abdominal surgeries.  She states he started complaining of upper abdominal pain about 4 days ago that he describes as sharp.  She states this is worse than what he is ever had before.  She states he has not had a bowel movement for a week.  He has been vomiting but it is not bile colored.  She denies fever.  She states he has cancer in his lower esophagus and in his upper stomach but as far she knows it has not spread anywhere else.  PCP House, Deliah Goody, FNP   Past Medical History:  Diagnosis Date  . Diabetes mellitus without complication ([place])   . Hypertension   . 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 Abdominal pain 05/03/2019  . Intractable abdominal pain 02/19/2019  . Esophageal cancer ([place]) 02/03/2019  . Goals of care, counseling/discussion 11/28/2018  . Type 2 diabetes mellitus without complication, without long-term current use of insulin ([place])   . Essential hypertension   . SIRS (systemic inflammatory response syndrome) ([place]) 12/11/2017  . HCAP (healthcare-associated pneumonia) 12/11/2017  . Infection due to parainfluenza virus 3 12/11/2017   . PNA (pneumonia) 12/10/2017  . Hypokalemia 12/10/2017  . 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 Dysphagia 01/05/2017    Past Surgical History:  Procedure Laterality Date  . ESOPHAGOGASTRODUODENOSCOPY (EGD) WITH PROPOFOL N/A 01/16/2017   Procedure: ESOPHAGOGASTRODUODENOSCOPY (EGD) WITH PROPOFOL;  Surgeon: [name], MD;  Location: [facility] ENDO SUITE;  Service: Endoscopy;  Laterality: N/A;  730   . IR FLUORO GUIDE PORT INSERTION RIGHT  02/13/2017  . IR US GUIDE VASC ACCESS RIGHT  02/13/2017  . lipoma removal    . PORTA CATH INSERTION Right 02/13/2017  . SAVORY DILATION N/A 01/16/2017   Procedure: SAVORY DILATION;  Surgeon: [name], MD;  Location: [facility] ENDO SUITE;  Service: Endoscopy;  Laterality: N/A;        Home Medications    Prior to Admission medications   Medication Sig Start Date End Date Taking? Authorizing Provider  acetaminophen (TYLENOL) 325 MG tablet Take by mouth. 02/23/19   [provider]  Alpha-Lipoic Acid 600 MG CAPS Take 1 capsule (600 mg total) by mouth daily. 09/28/17   Holley Bouche, NP  amLODipine (NORVASC) 10 MG tablet Take 1 tablet by mouth once daily 12/18/18   Lockamy, Randi L, NP-C  B Complex-C (B-COMPLEX WITH VITAMIN C) tablet Take 1 tablet by mouth daily. 09/28/17   Holley Bouche, NP  chlorproMAZINE (THORAZINE) 25 MG tablet Take 1 tablet (25 mg total) by mouth 3 (three) times daily. 02/17/19   Derek Jack, MD  famotidine (PEPCID) 20 MG tablet Take 20 mg by mouth 2 (two) times daily.  02/12/19 02/12/20  [provider]  Hyoscyamine Sulfate SL (LEVSIN/SL) 0.125 MG SUBL Place 1 tablet under the tongue every 4 (four) hours as needed. Patient taking differently: Place 1 tablet under the tongue every 4 (four) hours as needed (for cramping-abdominal pain).  12/06/18   Derek Jack, MD  Lactulose 20 GM/30ML SOLN Take 30 mLs (20 g total) by mouth 2 (two)  times a day. 02/17/19   Derek Jack, MD  lidocaine (XYLOCAINE) 2 % solution Use as directed 15 mLs in the mouth or throat every 3 (three) hours as needed for mouth pain.  01/30/19   [provider]  lidocaine-prilocaine (EMLA) cream Apply 1 application topically as needed. Patient taking differently: Apply 1 application topically as needed (for port access).  01/18/18   Derek Jack, MD  Morphine Sulfate (MORPHINE CONCENTRATE) 10 mg / 0.5 ml concentrated solution Take 0.5 mLs (10 mg total) by mouth every 4 (four) hours as needed for severe pain. 02/26/19   Derek Jack, MD  Oxycodone HCl 20 MG TABS Take 1 tablet (20 mg total) by mouth every 4 (four) hours as needed. 03/03/19   Derek Jack, MD  pantoprazole (PROTONIX) 40 MG tablet Take 1 tablet (40 mg total) by mouth 2 (two) times daily. Take 30 minutes before breakfast 05/21/18   Derek Jack, MD  prochlorperazine (COMPAZINE) 10 MG tablet TAKE 1 TABLET BY MOUTH EVERY 6 HOURS AS NEEDED FOR NAUSEA AND VOMITING Patient taking differently: Take 10 mg by mouth every 6 (six) hours as needed for nausea or vomiting.  01/14/19   Glennie Isle, NP-C  promethazine (PHENERGAN) 25 MG suppository Place 1 suppository (25 mg total) rectally every 6 (six) hours as needed for nausea or vomiting. 02/02/19   Deno Etienne, DO  sucralfate (CARAFATE) 1 g tablet Take 1 g by mouth 4 (four) times daily -  with meals and at bedtime.  02/12/19 02/12/20  [provider]  tamsulosin (FLOMAX) 0.4 MG CAPS capsule Take 1 capsule (0.4 mg total) by mouth at bedtime. 01/13/19   Derek Jack, MD    Family History Family History  Problem Relation Age of Onset  . Prostate cancer Father   . Colon cancer Neg Hx   . Colon polyps Neg Hx     Social History Social History   Tobacco Use  . Smoking status: Current Some Day Smoker    Packs/day: 0.25    Types: Cigarettes  . Smokeless tobacco: Never Used  Substance Use Topics  .  Alcohol use: No    Comment: hx heavy alcohol, hasn't drank in 22 yrs  . Drug use: Yes    Types: Marijuana     Allergies   Patient has no known allergies.   Review of Systems Review of Systems  All other systems reviewed and are negative.    Physical Exam Updated Vital Signs BP (!) 142/93   Pulse 81   Temp 98.3 F (36.8 C) (Oral)   Resp 18   Ht 6\' 1"  (1.854 m)   Wt 79.4 kg   SpO2 100%   BMI 23.09 kg/m   Physical Exam Vitals signs and nursing note reviewed.  Constitutional:      General: He is in acute distress.     Appearance: Normal appearance.  Comments: Patient is pacing around the room bent over yelling and screaming  HENT:     Head: Normocephalic and atraumatic.     Right Ear: External ear normal.     Left Ear: External ear normal.     Mouth/Throat:     Mouth: Mucous membranes are dry.  Eyes:     Extraocular Movements: Extraocular movements intact.     Conjunctiva/sclera: Conjunctivae normal.     Pupils: Pupils are equal, round, and reactive to light.  Cardiovascular:     Rate and Rhythm: Normal rate and regular rhythm.     Pulses: Normal pulses.  Pulmonary:     Effort: Pulmonary effort is normal. No respiratory distress.  Abdominal:     Comments: He states it does not hurt when I palpate his abdomen but it hurts inside.  His PEG is in place.  Musculoskeletal: Normal range of motion.  Skin:    General: Skin is warm and moist.     Capillary Refill: Capillary refill takes less than 2 seconds.     Comments: Patient has sweat on his scalp  Neurological:     Mental Status: He is alert and oriented to person, place, and time.     Cranial Nerves: No cranial nerve deficit.  Psychiatric:        Mood and Affect: Mood is anxious. Affect is labile.        Speech: Speech is rapid and pressured.        Behavior: Behavior is uncooperative.     Comments: Dramatic      ED Treatments / Results  Labs (all labs ordered are listed, but only abnormal results  are displayed) Results for orders placed or performed during the hospital encounter of 05/03/19  Comprehensive metabolic panel  Result Value Ref Range   Sodium 135 135 - 145 mmol/L   Potassium 3.5 3.5 - 5.1 mmol/L   Chloride 99 98 - 111 mmol/L   CO2 23 22 - 32 mmol/L   Glucose, Bld 185 (H) 70 - 99 mg/dL   BUN 19 6 - 20 mg/dL   Creatinine, Ser 0.90 0.61 - 1.24 mg/dL   Calcium 9.5 8.9 - 10.3 mg/dL   Total Protein 7.2 6.5 - 8.1 g/dL   Albumin 4.1 3.5 - 5.0 g/dL   AST 23 15 - 41 U/L   ALT 29 0 - 44 U/L   Alkaline Phosphatase 82 38 - 126 U/L   Total Bilirubin 0.2 (L) 0.3 - 1.2 mg/dL   GFR calc non Af Amer >60 >60 mL/min   GFR calc Af Amer >60 >60 mL/min   Anion gap 13 5 - 15  CBC with Differential  Result Value Ref Range   WBC 7.2 4.0 - 10.5 K/uL   RBC 4.51 4.22 - 5.81 MIL/uL   Hemoglobin 12.0 (L) 13.0 - 17.0 g/dL   HCT 35.5 (L) 39.0 - 52.0 %   MCV 78.7 (L) 80.0 - 100.0 fL   MCH 26.6 26.0 - 34.0 pg   MCHC 33.8 30.0 - 36.0 g/dL   RDW 15.1 11.5 - 15.5 %   Platelets 353 150 - 400 K/uL   nRBC 0.0 0.0 - 0.2 %   Neutrophils Relative % 65 %   Neutro Abs 4.7 1.7 - 7.7 K/uL   Lymphocytes Relative 26 %   Lymphs Abs 1.8 0.7 - 4.0 K/uL   Monocytes Relative 8 %   Monocytes Absolute 0.5 0.1 - 1.0 K/uL   Eosinophils Relative 1 %  Eosinophils Absolute 0.1 0.0 - 0.5 K/uL   Basophils Relative 0 %   Basophils Absolute 0.0 0.0 - 0.1 K/uL   Immature Granulocytes 0 %   Abs Immature Granulocytes 0.02 0.00 - 0.07 K/uL  Lipase, blood  Result Value Ref Range   Lipase 22 11 - 51 U/L  Urinalysis, Routine w reflex microscopic  Result Value Ref Range   Color, Urine YELLOW YELLOW   APPearance CLOUDY (A) CLEAR   Specific Gravity, Urine 1.039 (H) 1.005 - 1.030   pH 7.0 5.0 - 8.0   Glucose, UA NEGATIVE NEGATIVE mg/dL   Hgb urine dipstick NEGATIVE NEGATIVE   Bilirubin Urine NEGATIVE NEGATIVE   Ketones, ur NEGATIVE NEGATIVE mg/dL   Protein, ur NEGATIVE NEGATIVE mg/dL   Nitrite NEGATIVE NEGATIVE    Leukocytes,Ua NEGATIVE NEGATIVE   Laboratory interpretation all normal except mild hyperglycemia without metabolic acidosis, mild anemia    EKG None  Radiology Ct Abdomen Pelvis W Contrast  Result Date: 05/03/2019 CLINICAL DATA:  Acute abdominal pain EXAM: CT ABDOMEN AND PELVIS WITH CONTRAST TECHNIQUE: Multidetector CT imaging of the abdomen and pelvis was performed using the standard protocol following bolus administration of intravenous contrast. CONTRAST:  183mL OMNIPAQUE IOHEXOL 300 MG/ML  SOLN COMPARISON:  02/18/2019 FINDINGS: LOWER CHEST: There is no basilar pleural or apical pericardial effusion. HEPATOBILIARY: The hepatic contours and density are normal. The distal common bile duct is mildly dilated. No intrahepatic biliary dilatation. The gallbladder is normal. PANCREAS: The pancreatic parenchymal contours are normal and there is no ductal dilatation. There is no peripancreatic fluid collection. SPLEEN: Normal. ADRENALS/URINARY TRACT: --Adrenal glands: Normal. --Right kidney/ureter: No hydronephrosis, nephroureterolithiasis, perinephric stranding or solid renal mass. --Left kidney/ureter: No hydronephrosis, nephroureterolithiasis, perinephric stranding or solid renal mass. --Urinary bladder: Normal for degree of distention STOMACH/BOWEL: --Stomach/Duodenum: Mild wall thickening of the distal esophagus. There is a gastrostomy tube with tip in the stomach. Course of the duodenum is normal. --Small bowel: No dilatation or inflammation. --Colon: No focal abnormality. --Appendix: Normal. VASCULAR/LYMPHATIC: Normal course and caliber of the major abdominal vessels. No abdominal or pelvic lymphadenopathy. REPRODUCTIVE: Normal prostate size with symmetric seminal vesicles. MUSCULOSKELETAL. No bony spinal canal stenosis or focal osseous abnormality. OTHER: None. IMPRESSION: 1. No acute abdominal or pelvic abnormality. 2. Mild wall thickening of the distal esophagus, likely secondary to known/treated  esophageal adenocarcinoma, similar to prior study. 3. Mildly dilated distal common bile duct. Electronically Signed   By: Ulyses Jarred M.D.   On: 05/03/2019 03:26    Procedures Procedures (including critical care time)  Medications Ordered in ED Medications  HYDROmorphone (DILAUDID) injection 1 mg (1 mg Intravenous Given 05/03/19 0202)  ondansetron (ZOFRAN) injection 4 mg (4 mg Intravenous Given 05/03/19 0201)  LORazepam (ATIVAN) injection 1 mg (1 mg Intravenous Given 05/03/19 0203)  iohexol (OMNIPAQUE) 300 MG/ML solution 100 mL (100 mLs Intravenous Contrast Given 05/03/19 0251)  methylnaltrexone (RELISTOR) injection 12 mg (12 mg Subcutaneous Given 05/03/19 0454)  HYDROmorphone (DILAUDID) injection 1 mg (1 mg Intravenous Given 05/03/19 0617)     Initial Impression / Assessment and Plan / ED Course  I have reviewed the triage vital signs and the nursing notes.  Pertinent labs & imaging results that were available during my care of the patient were reviewed by me and considered in my medical decision making (see chart for details).      Patient is very dramatic, he is hard to examine because he cannot hold still and it keeps moving around.  He was given IV  pain medication and nausea medication.  I proceeded to CT scan to try to evaluate the etiology of his pain.  Review of care everywhere shows he was just seen at his pain management today.   Patient was given Relistor with minimal results.  He had been given pain medicine prior to CT and I tried to hold off give any more pain medicine to see if the Relistor would help with his symptoms however he is demanding to have pain medicine again.  Last time patient was here for similar problems he had not had a bowel movement in 3 weeks.  He was admitted here and then transferred to [facility] for his intractable abdominal pain.  6:19 AM Dr. Darrick Meigs will admit  Final Clinical Impressions(s) / ED Diagnoses   Final diagnoses:  Intractable abdominal  pain  Non-intractable vomiting with nausea, unspecified vomiting type    Plan admission  Rolland Porter, MD, Barbette Or, MD 05/03/19 [phone]

## 2019-05-04 DIAGNOSIS — R109 Unspecified abdominal pain: Secondary | ICD-10-CM | POA: Diagnosis not present

## 2019-05-04 DIAGNOSIS — R1033 Periumbilical pain: Secondary | ICD-10-CM | POA: Diagnosis not present

## 2019-05-04 DIAGNOSIS — K219 Gastro-esophageal reflux disease without esophagitis: Secondary | ICD-10-CM | POA: Diagnosis not present

## 2019-05-04 DIAGNOSIS — I10 Essential (primary) hypertension: Secondary | ICD-10-CM | POA: Diagnosis not present

## 2019-05-04 LAB — COMPREHENSIVE METABOLIC PANEL
ALT: 22 U/L (ref 0–44)
AST: 16 U/L (ref 15–41)
Albumin: 3.7 g/dL (ref 3.5–5.0)
Alkaline Phosphatase: 78 U/L (ref 38–126)
Anion gap: 8 (ref 5–15)
BUN: 13 mg/dL (ref 6–20)
CO2: 27 mmol/L (ref 22–32)
Calcium: 9 mg/dL (ref 8.9–10.3)
Chloride: 100 mmol/L (ref 98–111)
Creatinine, Ser: 0.82 mg/dL (ref 0.61–1.24)
GFR calc Af Amer: >60 mL/min (ref >60)
GFR calc non Af Amer: >60 mL/min (ref >60)
Glucose, Bld: 116 mg/dL — ABNORMAL HIGH (ref 70–99)
Potassium: 4.1 mmol/L (ref 3.5–5.1)
Sodium: 135 mmol/L (ref 135–145)
Total Bilirubin: 0.5 mg/dL (ref 0.3–1.2)
Total Protein: 6.5 g/dL (ref 6.5–8.1)

## 2019-05-04 LAB — CBC
HCT: 34.4 % — ABNORMAL LOW (ref 39.0–52.0)
Hemoglobin: 11.5 g/dL — ABNORMAL LOW (ref 13.0–17.0)
MCH: 26.4 pg (ref 26.0–34.0)
MCHC: 33.4 g/dL (ref 30.0–36.0)
MCV: 78.9 fL — ABNORMAL LOW (ref 80.0–100.0)
Platelets: 303 10*3/uL (ref 150–400)
RBC: 4.36 MIL/uL (ref 4.22–5.81)
RDW: 14.8 % (ref 11.5–15.5)
WBC: 5 10*3/uL (ref 4.0–10.5)
nRBC: 0 % (ref 0.0–0.2)

## 2019-05-04 MED ORDER — HYDRALAZINE HCL 25 MG PO TABS
25.0000 mg | ORAL_TABLET | Freq: Four times a day (QID) | ORAL | Status: DC | PRN
  Administered 2019-05-04: 25 mg via ORAL
  Filled 2019-05-04: qty 1

## 2019-05-04 NOTE — Progress Notes (Addendum)
Staff notified by Truddie Crumble that patient has a bed available at [facility], report was called to [facility]. Patient to go to the 6th Floor, room 621.

## 2019-05-04 NOTE — Progress Notes (Signed)
Paged on call MD about patients high BP.  Awaiting response.

## 2019-05-04 NOTE — Discharge Summary (Signed)
Physician Discharge Summary  Maxwell Henderson [id number] DOB: 09/02/1965 DOA: 05/03/2019  PCP: [name], FNP  Admit date: 05/03/2019 Discharge date: 05/04/2019  Admitted From: home Disposition: Transfer to [facility]  Recommendations for Outpatient Follow-up:  1. Patient will be transported to [facility] for further care  Discharge Condition: Stable CODE STATUS: Full code Diet recommendation: N.p.o., PEG tube dependent  Brief/Interim Summary: 53 year old male with a history of esophageal cancer, currently on chemotherapy, PEG tube dependent, hypertension, chronic pain syndrome, presents to the emergency room with a 2-day history of persistent abdominal pain.  Pain is located in his periumbilical area.  It does not improve after taking his regular pain medications.  He was not having any vomiting, but was having dry heaves.  Denies any fever, cough, shortness of breath.  Evaluation in the emergency room, CBC chemistry and lipase were unremarkable.  CT of the abdomen did not show any acute findings, but there was distal esophageal thickening.  He was referred for admission for uncontrolled pain.  Patient reports that he receives the majority of his care at [facility].  He reports having a procedure scheduled on 9/21.  He requested being transferred there for continuity of care.  Case was discussed with Dr. Katharine Look at [facility] who has graciously accepted him in transfer.  The etiology of his abdominal pain is not entirely clear.  He does follow with gastroenterology at [facility] who could likely provide further input.  Patient is otherwise stable for transfer at this time.  Discharge Diagnoses:  Active Problems:   Essential hypertension   Esophageal cancer (HCC)   Abdominal pain   GERD (gastroesophageal reflux disease)   PEG (percutaneous endoscopic gastrostomy) status ([place])    Discharge Instructions  Discharge  Instructions    Diet - low sodium heart healthy   Complete by: As directed    Increase activity slowly   Complete by: As directed      Allergies as of 05/04/2019   No Known Allergies     Medication List    STOP taking these medications   lidocaine 2 % solution Commonly known as: XYLOCAINE   lidocaine-prilocaine cream Commonly known as: EMLA   morphine CONCENTRATE 10 mg / 0.5 ml concentrated solution   promethazine 25 MG suppository Commonly known as: PHENERGAN     TAKE these medications   Alpha-Lipoic Acid 600 MG Caps Take 1 capsule (600 mg total) by mouth daily.   amLODipine 10 MG tablet Commonly known as: NORVASC Take 1 tablet by mouth once daily   B-complex with vitamin C tablet Take 1 tablet by mouth daily.   chlorproMAZINE 25 MG tablet Commonly known as: THORAZINE Take 1 tablet (25 mg total) by mouth 3 (three) times daily.   Hyoscyamine Sulfate SL 0.125 MG Subl Commonly known as: Levsin/SL Place 1 tablet under the tongue every 4 (four) hours as needed.   Lactulose 20 GM/30ML Soln Take 30 mLs (20 g total) by mouth 2 (two) times a day.   loratadine 10 MG tablet Commonly known as: CLARITIN Take 1 tablet by mouth daily.   Oxycodone HCl 20 MG Tabs Take 1 tablet (20 mg total) by mouth every 4 (four) hours as needed.   pantoprazole 40 MG tablet Commonly known as: PROTONIX Take 1 tablet (40 mg total) by mouth 2 (two) times daily. Take 30 minutes before breakfast   prochlorperazine 10 MG tablet Commonly known as: COMPAZINE TAKE 1 TABLET BY MOUTH  EVERY 6 HOURS AS NEEDED FOR NAUSEA AND VOMITING   tamsulosin 0.4 MG Caps capsule Commonly known as: Flomax Take 1 capsule (0.4 mg total) by mouth at bedtime.   thiamine 100 MG tablet Take 1 tablet by mouth daily.   Xtampza ER 36 MG C12a Generic drug: oxyCODONE ER Take 1 capsule by mouth 2 (two) times daily.       No Known Allergies  Consultations:     Procedures/Studies: Ct Abdomen Pelvis W  Contrast  Result Date: 05/03/2019 CLINICAL DATA:  Acute abdominal pain EXAM: CT ABDOMEN AND PELVIS WITH CONTRAST TECHNIQUE: Multidetector CT imaging of the abdomen and pelvis was performed using the standard protocol following bolus administration of intravenous contrast. CONTRAST:  161mL OMNIPAQUE IOHEXOL 300 MG/ML  SOLN COMPARISON:  02/18/2019 FINDINGS: LOWER CHEST: There is no basilar pleural or apical pericardial effusion. HEPATOBILIARY: The hepatic contours and density are normal. The distal common bile duct is mildly dilated. No intrahepatic biliary dilatation. The gallbladder is normal. PANCREAS: The pancreatic parenchymal contours are normal and there is no ductal dilatation. There is no peripancreatic fluid collection. SPLEEN: Normal. ADRENALS/URINARY TRACT: --Adrenal glands: Normal. --Right kidney/ureter: No hydronephrosis, nephroureterolithiasis, perinephric stranding or solid renal mass. --Left kidney/ureter: No hydronephrosis, nephroureterolithiasis, perinephric stranding or solid renal mass. --Urinary bladder: Normal for degree of distention STOMACH/BOWEL: --Stomach/Duodenum: Mild wall thickening of the distal esophagus. There is a gastrostomy tube with tip in the stomach. Course of the duodenum is normal. --Small bowel: No dilatation or inflammation. --Colon: No focal abnormality. --Appendix: Normal. VASCULAR/LYMPHATIC: Normal course and caliber of the major abdominal vessels. No abdominal or pelvic lymphadenopathy. REPRODUCTIVE: Normal prostate size with symmetric seminal vesicles. MUSCULOSKELETAL. No bony spinal canal stenosis or focal osseous abnormality. OTHER: None. IMPRESSION: 1. No acute abdominal or pelvic abnormality. 2. Mild wall thickening of the distal esophagus, likely secondary to known/treated esophageal adenocarcinoma, similar to prior study. 3. Mildly dilated distal common bile duct. Electronically Signed   By: Ulyses Jarred M.D.   On: 05/03/2019 03:26        Subjective: Patient feels her abdominal pain is better today than it was yesterday.  It is not resolved and not back to baseline.  In discussing the possibility of discharging home today with outpatient follow-up with [facility] tomorrow, becomes very angry, argumentative and aggressive.  Discharge Exam: Vitals:   05/03/19 1549 05/03/19 2124 05/04/19 0650 05/04/19 1346  BP: (!) 130/98 (!) 146/100 (!) 147/101 (!) 143/102  Pulse: 73 78 75 72  Resp: 20 18  19   Temp: 98.2 F (36.8 C) 98.1 F (36.7 C) 98.2 F (36.8 C) 98.5 F (36.9 C)  TempSrc:  Oral  Oral  SpO2: 100% 100% 100% 100%  Weight:      Height:        General: Pt is alert, awake, not in acute distress Cardiovascular: RRR, S1/S2 +, no rubs, no gallops Respiratory: CTA bilaterally, no wheezing, no rhonchi Abdominal: Soft, NT, ND, bowel sounds + Extremities: no edema, no cyanosis    The results of significant diagnostics from this hospitalization (including imaging, microbiology, ancillary and laboratory) are listed below for reference.     Microbiology: Recent Results (from the past 240 hour(s))  SARS Coronavirus 2 [facility] order, Performed in [facility] hospital lab) Nasopharyngeal Nasopharyngeal Swab     Status: None   Collection Time: 05/03/19  6:22 AM   Specimen: Nasopharyngeal Swab  Result Value Ref Range Status   SARS Coronavirus 2 NEGATIVE NEGATIVE Final    Comment: (NOTE) If  result is NEGATIVE SARS-CoV-2 target nucleic acids are NOT DETECTED. The SARS-CoV-2 RNA is generally detectable in upper and lower  respiratory specimens during the acute phase of infection. The lowest  concentration of SARS-CoV-2 viral copies this assay can detect is 250  copies / mL. A negative result does not preclude SARS-CoV-2 infection  and should not be used as the sole basis for treatment or other  patient management decisions.  A negative result may occur with  improper specimen collection / handling, submission of  specimen other  than nasopharyngeal swab, presence of viral mutation(s) within the  areas targeted by this assay, and inadequate number of viral copies  (<250 copies / mL). A negative result must be combined with clinical  observations, patient history, and epidemiological information. If result is POSITIVE SARS-CoV-2 target nucleic acids are DETECTED. The SARS-CoV-2 RNA is generally detectable in upper and lower  respiratory specimens dur ing the acute phase of infection.  Positive  results are indicative of active infection with SARS-CoV-2.  Clinical  correlation with patient history and other diagnostic information is  necessary to determine patient infection status.  Positive results do  not rule out bacterial infection or co-infection with other viruses. If result is PRESUMPTIVE POSTIVE SARS-CoV-2 nucleic acids MAY BE PRESENT.   A presumptive positive result was obtained on the submitted specimen  and confirmed on repeat testing.  While 2019 novel coronavirus  (SARS-CoV-2) nucleic acids may be present in the submitted sample  additional confirmatory testing may be necessary for epidemiological  and / or clinical management purposes  to differentiate between  SARS-CoV-2 and other Sarbecovirus currently known to infect humans.  If clinically indicated additional testing with an alternate test  methodology [phone]) is advised. The SARS-CoV-2 RNA is generally  detectable in upper and lower respiratory sp ecimens during the acute  phase of infection. The expected result is Negative. Fact Sheet for Patients:  StrictlyIdeas.no Fact Sheet for Healthcare Providers: BankingDealers.co.za This test is not yet approved or cleared by the [place] FDA and has been authorized for detection and/or diagnosis of SARS-CoV-2 by FDA under an Emergency Use Authorization (EUA).  This EUA will remain in effect (meaning this test can be used) for the  duration of the COVID-19 declaration under Section 564(b)(1) of the Act, 21 U.S.C. section 360bbb-3(b)(1), unless the authorization is terminated or revoked sooner. Performed at [facility], [address] [place], [place] [address]      Labs: BNP (last 3 results) No results for input(s): BNP in the last 8760 hours. Basic Metabolic Panel: Recent Labs  Lab 05/03/19 0132 05/03/19 1130 05/04/19 0630  NA 135  --  135  K 3.5  --  4.1  CL 99  --  100  CO2 23  --  27  GLUCOSE 185*  --  116*  BUN 19  --  13  CREATININE 0.90 0.81 0.82  CALCIUM 9.5  --  9.0   Liver Function Tests: Recent Labs  Lab 05/03/19 0132 05/04/19 0630  AST 23 16  ALT 29 22  ALKPHOS 82 78  BILITOT 0.2* 0.5  PROT 7.2 6.5  ALBUMIN 4.1 3.7   Recent Labs  Lab 05/03/19 0132  LIPASE 22   No results for input(s): AMMONIA in the last 168 hours. CBC: Recent Labs  Lab 05/03/19 0132 05/03/19 1130 05/04/19 0630  WBC 7.2 6.6 5.0  NEUTROABS 4.7  --   --   HGB 12.0* 11.2* 11.5*  HCT 35.5* 34.3* 34.4*  MCV 78.7* 78.7* 78.9*  PLT 353 309 303   Cardiac Enzymes: No results for input(s): CKTOTAL, CKMB, CKMBINDEX, TROPONINI in the last 168 hours. BNP: Invalid input(s): POCBNP CBG: No results for input(s): GLUCAP in the last 168 hours. D-Dimer No results for input(s): DDIMER in the last 72 hours. Hgb A1c No results for input(s): HGBA1C in the last 72 hours. Lipid Profile No results for input(s): CHOL, HDL, LDLCALC, TRIG, CHOLHDL, LDLDIRECT in the last 72 hours. Thyroid function studies No results for input(s): TSH, T4TOTAL, T3FREE, THYROIDAB in the last 72 hours.  Invalid input(s): FREET3 Anemia work up No results for input(s): VITAMINB12, FOLATE, FERRITIN, TIBC, IRON, RETICCTPCT in the last 72 hours. Urinalysis    Component Value Date/Time   COLORURINE YELLOW 05/03/2019 0325   APPEARANCEUR CLOUDY (A) 05/03/2019 0325   LABSPEC 1.039 (H) 05/03/2019 0325   PHURINE 7.0 05/03/2019 0325   GLUCOSEU  NEGATIVE 05/03/2019 0325   HGBUR NEGATIVE 05/03/2019 0325   BILIRUBINUR NEGATIVE 05/03/2019 0325   KETONESUR NEGATIVE 05/03/2019 0325   PROTEINUR NEGATIVE 05/03/2019 0325   NITRITE NEGATIVE 05/03/2019 0325   LEUKOCYTESUR NEGATIVE 05/03/2019 0325   Sepsis Labs Invalid input(s): PROCALCITONIN,  WBC,  LACTICIDVEN Microbiology Recent Results (from the past 240 hour(s))  SARS Coronavirus 2 [facility] order, Performed in [facility] hospital lab) Nasopharyngeal Nasopharyngeal Swab     Status: None   Collection Time: 05/03/19  6:22 AM   Specimen: Nasopharyngeal Swab  Result Value Ref Range Status   SARS Coronavirus 2 NEGATIVE NEGATIVE Final    Comment: (NOTE) If result is NEGATIVE SARS-CoV-2 target nucleic acids are NOT DETECTED. The SARS-CoV-2 RNA is generally detectable in upper and lower  respiratory specimens during the acute phase of infection. The lowest  concentration of SARS-CoV-2 viral copies this assay can detect is 250  copies / mL. A negative result does not preclude SARS-CoV-2 infection  and should not be used as the sole basis for treatment or other  patient management decisions.  A negative result may occur with  improper specimen collection / handling, submission of specimen other  than nasopharyngeal swab, presence of viral mutation(s) within the  areas targeted by this assay, and inadequate number of viral copies  (<250 copies / mL). A negative result must be combined with clinical  observations, patient history, and epidemiological information. If result is POSITIVE SARS-CoV-2 target nucleic acids are DETECTED. The SARS-CoV-2 RNA is generally detectable in upper and lower  respiratory specimens dur ing the acute phase of infection.  Positive  results are indicative of active infection with SARS-CoV-2.  Clinical  correlation with patient history and other diagnostic information is  necessary to determine patient infection status.  Positive results do  not rule out  bacterial infection or co-infection with other viruses. If result is PRESUMPTIVE POSTIVE SARS-CoV-2 nucleic acids MAY BE PRESENT.   A presumptive positive result was obtained on the submitted specimen  and confirmed on repeat testing.  While 2019 novel coronavirus  (SARS-CoV-2) nucleic acids may be present in the submitted sample  additional confirmatory testing may be necessary for epidemiological  and / or clinical management purposes  to differentiate between  SARS-CoV-2 and other Sarbecovirus currently known to infect humans.  If clinically indicated additional testing with an alternate test  methodology [phone]) is advised. The SARS-CoV-2 RNA is generally  detectable in upper and lower respiratory sp ecimens during the acute  phase of infection. The expected result is Negative. Fact Sheet for Patients:  StrictlyIdeas.no Fact Sheet  for Healthcare Providers: BankingDealers.co.za This test is not yet approved or cleared by the [place] and has been authorized for detection and/or diagnosis of SARS-CoV-2 by FDA under an Emergency Use Authorization (EUA).  This EUA will remain in effect (meaning this test can be used) for the duration of the COVID-19 declaration under Section 564(b)(1) of the Act, 21 U.S.C. section 360bbb-3(b)(1), unless the authorization is terminated or revoked sooner. Performed at [facility], [address], [place], [place] [address]      Time coordinating discharge: 31mins  SIGNED:   Kathie Dike, MD  [facility] 05/04/2019, 2:21 PM   If 7PM-7AM, please contact night-coverage [email or web address]

## 2019-05-04 NOTE — Progress Notes (Signed)
[facility] transport on unit to transfer patient. Patient stable at this time and consents to transfer.

## 2019-05-10 ENCOUNTER — Encounter (HOSPITAL_COMMUNITY): Payer: Self-pay

## 2019-05-10 ENCOUNTER — Emergency Department (HOSPITAL_COMMUNITY): Payer: Medicaid Other

## 2019-05-10 ENCOUNTER — Emergency Department (HOSPITAL_COMMUNITY)
Admission: EM | Admit: 2019-05-10 | Discharge: 2019-05-10 | Disposition: A | Discharge status: Home / Self Care | Payer: Medicaid Other | Attending: Emergency Medicine | Admitting: Emergency Medicine

## 2019-05-10 ENCOUNTER — Other Ambulatory Visit: Payer: Self-pay

## 2019-05-10 DIAGNOSIS — C159 Malignant neoplasm of esophagus, unspecified: Secondary | ICD-10-CM | POA: Diagnosis not present

## 2019-05-10 DIAGNOSIS — Z20828 Contact with and (suspected) exposure to other viral communicable diseases: Secondary | ICD-10-CM | POA: Insufficient documentation

## 2019-05-10 DIAGNOSIS — I10 Essential (primary) hypertension: Secondary | ICD-10-CM | POA: Diagnosis not present

## 2019-05-10 DIAGNOSIS — F17210 Nicotine dependence, cigarettes, uncomplicated: Secondary | ICD-10-CM | POA: Insufficient documentation

## 2019-05-10 DIAGNOSIS — R109 Unspecified abdominal pain: Secondary | ICD-10-CM

## 2019-05-10 DIAGNOSIS — Z79899 Other long term (current) drug therapy: Secondary | ICD-10-CM | POA: Diagnosis not present

## 2019-05-10 DIAGNOSIS — E119 Type 2 diabetes mellitus without complications: Secondary | ICD-10-CM | POA: Diagnosis not present

## 2019-05-10 DIAGNOSIS — R1084 Generalized abdominal pain: Secondary | ICD-10-CM | POA: Diagnosis present

## 2019-05-10 LAB — CBC WITH DIFFERENTIAL/PLATELET
Abs Immature Granulocytes: 0.01 10*3/uL (ref 0.00–0.07)
Basophils Absolute: 0 10*3/uL (ref 0.0–0.1)
Basophils Relative: 0 %
Eosinophils Absolute: 0.1 10*3/uL (ref 0.0–0.5)
Eosinophils Relative: 2 %
HCT: 32.8 % — ABNORMAL LOW (ref 39.0–52.0)
Hemoglobin: 10.8 g/dL — ABNORMAL LOW (ref 13.0–17.0)
Immature Granulocytes: 0 %
Lymphocytes Relative: 23 %
Lymphs Abs: 0.9 10*3/uL (ref 0.7–4.0)
MCH: 26 pg (ref 26.0–34.0)
MCHC: 32.9 g/dL (ref 30.0–36.0)
MCV: 79 fL — ABNORMAL LOW (ref 80.0–100.0)
Monocytes Absolute: 0.5 10*3/uL (ref 0.1–1.0)
Monocytes Relative: 12 %
Neutro Abs: 2.5 10*3/uL (ref 1.7–7.7)
Neutrophils Relative %: 63 %
Platelets: 221 10*3/uL (ref 150–400)
RBC: 4.15 MIL/uL — ABNORMAL LOW (ref 4.22–5.81)
RDW: 15.1 % (ref 11.5–15.5)
WBC: 4 10*3/uL (ref 4.0–10.5)
nRBC: 0 % (ref 0.0–0.2)

## 2019-05-10 LAB — COMPREHENSIVE METABOLIC PANEL
ALT: 14 U/L (ref 0–44)
AST: 16 U/L (ref 15–41)
Albumin: 3.8 g/dL (ref 3.5–5.0)
Alkaline Phosphatase: 68 U/L (ref 38–126)
Anion gap: 10 (ref 5–15)
BUN: 9 mg/dL (ref 6–20)
CO2: 25 mmol/L (ref 22–32)
Calcium: 9.3 mg/dL (ref 8.9–10.3)
Chloride: 100 mmol/L (ref 98–111)
Creatinine, Ser: 0.71 mg/dL (ref 0.61–1.24)
GFR calc Af Amer: >60 mL/min (ref >60)
GFR calc non Af Amer: >60 mL/min (ref >60)
Glucose, Bld: 166 mg/dL — ABNORMAL HIGH (ref 70–99)
Potassium: 3.8 mmol/L (ref 3.5–5.1)
Sodium: 135 mmol/L (ref 135–145)
Total Bilirubin: 0.6 mg/dL (ref 0.3–1.2)
Total Protein: 6.8 g/dL (ref 6.5–8.1)

## 2019-05-10 LAB — LIPASE, BLOOD: Lipase: 25 U/L (ref 11–51)

## 2019-05-10 MED ORDER — DALTEPARIN SODIUM 95000 UNIT/9.5ML ~~LOC~~ SOLN
15.00 | SUBCUTANEOUS | Status: DC
Start: ? — End: 2019-05-10

## 2019-05-10 MED ORDER — EQ COLD RELIEF COMPLETE 30-2-15-325 MG PO TABS
25.00 | ORAL_TABLET | ORAL | Status: DC
Start: 2019-05-09 — End: 2019-05-10

## 2019-05-10 MED ORDER — HEPARIN SOD (PORK) LOCK FLUSH 100 UNIT/ML IV SOLN
500.0000 [IU] | Freq: Once | INTRAVENOUS | Status: AC
  Administered 2019-05-10: 500 [IU]
  Filled 2019-05-10: qty 5

## 2019-05-10 MED ORDER — VICON FORTE PO CAPS
17.00 | ORAL_CAPSULE | ORAL | Status: DC
Start: 2019-05-09 — End: 2019-05-10

## 2019-05-10 MED ORDER — Medication
30.00 | Status: DC
Start: ? — End: 2019-05-10

## 2019-05-10 MED ORDER — Medication
Status: DC
Start: 2019-05-09 — End: 2019-05-10

## 2019-05-10 MED ORDER — TUMS LASTING EFFECTS PO
30.00 | ORAL | Status: DC
Start: ? — End: 2019-05-10

## 2019-05-10 MED ORDER — LURASIDONE HCL 40 MG PO TABS
100.00 | ORAL_TABLET | ORAL | Status: DC
Start: ? — End: 2019-05-10

## 2019-05-10 MED ORDER — HYDROMORPHONE HCL 1 MG/ML IJ SOLN
1.0000 mg | Freq: Once | INTRAMUSCULAR | Status: AC
  Administered 2019-05-10: 1 mg via INTRAVENOUS
  Filled 2019-05-10: qty 1

## 2019-05-10 MED ORDER — SUCRALFATE 1 G PO TABS
1.00 | ORAL_TABLET | ORAL | Status: DC
Start: 2019-05-09 — End: 2019-05-10

## 2019-05-10 MED ORDER — EQL PEDIATRIC ELECTROLYTE PO SOLN
0.40 | ORAL | Status: DC
Start: 2019-05-10 — End: 2019-05-10

## 2019-05-10 MED ORDER — CVS EAR DROPS OT
40.00 | OTIC | Status: DC
Start: 2019-05-10 — End: 2019-05-10

## 2019-05-10 MED ORDER — ONDANSETRON HCL 4 MG/2ML IJ SOLN
4.0000 mg | Freq: Once | INTRAMUSCULAR | Status: AC
  Administered 2019-05-10: 4 mg via INTRAVENOUS
  Filled 2019-05-10: qty 2

## 2019-05-10 MED ORDER — WATER BOTTLE ECONOMY #15 MISC
20.00 | Status: DC
Start: 2019-05-10 — End: 2019-05-10

## 2019-05-10 MED ORDER — Medication
1.00 | Status: DC
Start: 2019-05-09 — End: 2019-05-10

## 2019-05-10 MED ORDER — DUPLEX T 10 % EX SHAM
10.00 | MEDICATED_SHAMPOO | CUTANEOUS | Status: DC
Start: 2019-05-10 — End: 2019-05-10

## 2019-05-10 MED ORDER — EQ LUBRICANT EYE DROPS OP
650.00 | OPHTHALMIC | Status: DC
Start: 2019-05-09 — End: 2019-05-10

## 2019-05-10 MED ORDER — ONDANSETRON HCL 4 MG/2ML IJ SOLN
4.0000 mg | Freq: Once | INTRAMUSCULAR | Status: AC
  Administered 2019-05-10: 07:00:00 4 mg via INTRAVENOUS
  Filled 2019-05-10: qty 2

## 2019-05-10 MED ORDER — ASPIRIN-CAFFEINE PO
20.00 | ORAL | Status: DC
Start: ? — End: 2019-05-10

## 2019-05-10 MED ORDER — Medication
100.00 | Status: DC
Start: 2019-05-10 — End: 2019-05-10

## 2019-05-10 MED ORDER — LOFIBRA 134 MG PO CAPS
1.00 | ORAL_CAPSULE | ORAL | Status: DC
Start: 2019-05-11 — End: 2019-05-10

## 2019-05-10 MED ORDER — ONDANSETRON HCL 4 MG/2ML IJ SOLN
4.00 | INTRAMUSCULAR | Status: DC
Start: ? — End: 2019-05-10

## 2019-05-10 MED ORDER — KETAMINE HCL 10 MG/ML IJ SOLN
0.3000 mg/kg | Freq: Once | INTRAMUSCULAR | Status: AC
  Administered 2019-05-10: 24 mg via INTRAVENOUS
  Filled 2019-05-10: qty 1

## 2019-05-10 MED ORDER — ALUM & MAG HYDROXIDE-SIMETH 200-200-20 MG/5ML PO SUSP
30.0000 mL | Freq: Once | ORAL | Status: AC
  Administered 2019-05-10: 30 mL via ORAL
  Filled 2019-05-10: qty 30

## 2019-05-10 MED ORDER — Medication
40.00 | Status: DC
Start: 2019-05-09 — End: 2019-05-10

## 2019-05-10 MED ORDER — HYDROMORPHONE HCL 1 MG/ML IJ SOLN
1.0000 mg | Freq: Once | INTRAMUSCULAR | Status: AC
  Administered 2019-05-10: 11:00:00 1 mg via INTRAVENOUS
  Filled 2019-05-10: qty 1

## 2019-05-10 NOTE — ED Notes (Signed)
Pt and wife asked to be discharged and go to [facility].  Dr Rogene Houston informed, plan to discharge so wife can transport to [facility].  Pt given dilaudid prior to discharge.  Heparin port-a-cath flushed with heparin.  [facility] informed of situation.

## 2019-05-10 NOTE — ED Notes (Signed)
Called [facility]: for Oncology consult for Dr. Rogene Houston

## 2019-05-10 NOTE — Discharge Instructions (Addendum)
Patient does not want to wait for the admission to [place] were accepted by Dr. Lindon Romp hematology oncology.  You state your wants to go home take your oral pain medicine if it does not work you will go to [facility] emergency department.  Return as needed.

## 2019-05-10 NOTE — ED Notes (Signed)
Pt has had period of anger and asking God to stop the pain.  C/o upper gastric pain rating pain greater that ten at times.  Given Dilaudid several times with mild relief.

## 2019-05-10 NOTE — ED Provider Notes (Addendum)
[facility] EMERGENCY DEPARTMENT Provider Note   CSN: [id number] Arrival date & time: 05/10/19  0554     History   Chief Complaint Chief Complaint  Patient presents with  . Abdominal Pain    HPI Maxwell Henderson is a 53 y.o. male.     HPI  This a 53 year old male with a history of esophageal adenocarcinoma stage IV, hypertension, diabetes who presents with abdominal pain.  Patient was just admitted to [facility] after being transferred from [facility].  He was admitted 9/20- 9/25.  He was mainly treated for refractory abdominal pain.  He did have a CT scan that showed some extension and local invasion of his known esophageal cancer.  Patient states that he was in bed tonight when he had recurrence of worsening abdominal pain.  He describes it as initially feeling "bloated" and "like I needed to have a bowel movement."  He had a large bowel movement 2 days ago which did seem to improve his abdominal pain.  He reports nausea without vomiting.  Currently his pain is 10 out of 10.  He took 2 OxyContin at home with no relief.  He states that generally his pain is not "quite as bad as it was when I got admitted but I do not want to get that bad."  He denies any fevers, chest pain, shortness of breath.  Past Medical History:  Diagnosis Date  . Diabetes mellitus without complication ([place])   . Hypertension   . 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 Primary esophageal adenocarcinoma ([place]) 01/29/2017    Patient Active Problem List   Diagnosis Date Noted  . Abdominal pain 05/03/2019  . GERD (gastroesophageal reflux disease) 05/03/2019  . PEG (percutaneous endoscopic gastrostomy) status ([place]) 05/03/2019  . Intractable abdominal pain 02/19/2019  . Esophageal cancer ([place]) 02/03/2019  . Goals of care, counseling/discussion 11/28/2018  . Type 2 diabetes mellitus without complication, without long-term current use of insulin ([place])   . Essential hypertension   . SIRS  (systemic inflammatory response syndrome) ([place]) 12/11/2017  . HCAP (healthcare-associated pneumonia) 12/11/2017  . Infection due to parainfluenza virus 3 12/11/2017  . PNA (pneumonia) 12/10/2017  . Hypokalemia 12/10/2017  . 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 Primary esophageal adenocarcinoma ([place]) 01/29/2017  . Dysphagia 01/05/2017    Past Surgical History:  Procedure Laterality Date  . ESOPHAGOGASTRODUODENOSCOPY (EGD) WITH PROPOFOL N/A 01/16/2017   Procedure: ESOPHAGOGASTRODUODENOSCOPY (EGD) WITH PROPOFOL;  Surgeon: [name], MD;  Location: [facility] ENDO SUITE;  Service: Endoscopy;  Laterality: N/A;  730   . IR FLUORO GUIDE PORT INSERTION RIGHT  02/13/2017  . IR US GUIDE VASC ACCESS RIGHT  02/13/2017  . lipoma removal    . PORTA CATH INSERTION Right 02/13/2017  . SAVORY DILATION N/A 01/16/2017   Procedure: SAVORY DILATION;  Surgeon: [name], MD;  Location: [facility] ENDO SUITE;  Service: Endoscopy;  Laterality: N/A;        Home Medications    Prior to Admission medications   Medication Sig Start Date End Date Taking? Authorizing Provider  Alpha-Lipoic Acid 600 MG CAPS Take 1 capsule (600 mg total) by mouth daily. Patient not taking: Reported on 05/03/2019 09/28/17   Holley Bouche, NP  amLODipine (NORVASC) 10 MG tablet Take 1 tablet by mouth once daily 12/18/18   Lockamy, Randi L, NP-C  B Complex-C (B-COMPLEX WITH VITAMIN C) tablet Take 1 tablet by mouth daily. Patient not taking:  Reported on 05/03/2019 09/28/17   Holley Bouche, NP  chlorproMAZINE (THORAZINE) 25 MG tablet Take 1 tablet (25 mg total) by mouth 3 (three) times daily. Patient not taking: Reported on 05/03/2019 02/17/19   Derek Jack, MD  Hyoscyamine Sulfate SL (LEVSIN/SL) 0.125 MG SUBL Place 1 tablet under the tongue every 4 (four) hours as needed. Patient not taking: Reported on 05/03/2019 12/06/18   Derek Jack, MD  Lactulose 20 GM/30ML SOLN Take 30 mLs (20 g total) by mouth  2 (two) times a day. Patient not taking: Reported on 05/03/2019 02/17/19   Derek Jack, MD  loratadine (CLARITIN) 10 MG tablet Take 1 tablet by mouth daily. 01/30/19   [provider]  oxyCODONE ER (XTAMPZA ER) 36 MG C12A Take 1 capsule by mouth 2 (two) times daily. 04/30/19 05/30/19  [provider]  Oxycodone HCl 20 MG TABS Take 1 tablet (20 mg total) by mouth every 4 (four) hours as needed. 03/03/19   Derek Jack, MD  pantoprazole (PROTONIX) 40 MG tablet Take 1 tablet (40 mg total) by mouth 2 (two) times daily. Take 30 minutes before breakfast 05/21/18   Derek Jack, MD  prochlorperazine (COMPAZINE) 10 MG tablet TAKE 1 TABLET BY MOUTH EVERY 6 HOURS AS NEEDED FOR NAUSEA AND VOMITING Patient not taking: No sig reported 01/14/19   Lockamy, Randi L, NP-C  tamsulosin (FLOMAX) 0.4 MG CAPS capsule Take 1 capsule (0.4 mg total) by mouth at bedtime. Patient not taking: Reported on 05/03/2019 01/13/19   Derek Jack, MD  thiamine 100 MG tablet Take 1 tablet by mouth daily. 02/13/19   [provider]    Family History Family History  Problem Relation Age of Onset  . Prostate cancer Father   . Colon cancer Neg Hx   . Colon polyps Neg Hx     Social History Social History   Tobacco Use  . Smoking status: Current Some Day Smoker    Packs/day: 0.25    Types: Cigarettes  . Smokeless tobacco: Never Used  Substance Use Topics  . Alcohol use: No    Comment: hx heavy alcohol, hasn't drank in 22 yrs  . Drug use: Yes    Types: Marijuana     Allergies   Patient has no known allergies.   Review of Systems Review of Systems  Constitutional: Negative for fever.  Respiratory: Negative for chest tightness and shortness of breath.   Cardiovascular: Negative for chest pain.  Gastrointestinal: Positive for abdominal pain, constipation and nausea. Negative for diarrhea and vomiting.  Genitourinary: Negative for hematuria.  All other systems reviewed  and are negative.    Physical Exam Updated Vital Signs BP (!) 135/99   Pulse 92   Temp 97.7 F (36.5 C) (Oral)   Resp 20   Ht 1.854 m (6\' 1" )   Wt 79.4 kg   SpO2 100%   BMI 23.09 kg/m   Physical Exam Vitals signs and nursing note reviewed.  Constitutional:      Appearance: He is well-developed. He is not ill-appearing.  HENT:     Head: Normocephalic and atraumatic.  Neck:     Musculoskeletal: Neck supple.  Cardiovascular:     Rate and Rhythm: Normal rate and regular rhythm.     Heart sounds: Normal heart sounds. No murmur.  Pulmonary:     Effort: Pulmonary effort is normal. No respiratory distress.     Breath sounds: Normal breath sounds. No wheezing.  Abdominal:     General: Bowel sounds are normal.  Palpations: Abdomen is soft.     Tenderness: There is no abdominal tenderness. There is no rebound.     Comments: PEG tube in place, no adjacent erythema or drainage, abdomen is soft without objective tenderness, normal bowel sounds  Lymphadenopathy:     Cervical: No cervical adenopathy.  Skin:    General: Skin is warm and dry.  Neurological:     Mental Status: He is alert and oriented to person, place, and time.  Psychiatric:        Mood and Affect: Mood normal.      ED Treatments / Results  Labs (all labs ordered are listed, but only abnormal results are displayed) Labs Reviewed  CBC WITH DIFFERENTIAL/PLATELET - Abnormal; Notable for the following components:      Result Value   RBC 4.15 (*)    Hemoglobin 10.8 (*)    HCT 32.8 (*)    MCV 79.0 (*)    All other components within normal limits  COMPREHENSIVE METABOLIC PANEL  LIPASE, BLOOD    EKG EKG Interpretation  Date/Time:  Saturday May 10 2019 06:35:44 EDT Ventricular Rate:  99 PR Interval:    QRS Duration: 96 QT Interval:  321 QTC Calculation: 412 R Axis:   72 Text Interpretation:  Sinus rhythm RSR' in V1 or V2, right VCD or RVH Confirmed by Thayer Jew [id number]) on 05/10/2019  6:52:28 AM   Radiology No results found.  Procedures Procedures (including critical care time)  Medications Ordered in ED Medications  ketamine (KETALAR) injection 24 mg (24 mg Intravenous Given 05/10/19 0631)  ondansetron (ZOFRAN) injection 4 mg (4 mg Intravenous Given 05/10/19 0631)     Initial Impression / Assessment and Plan / ED Course  I have reviewed the triage vital signs and the nursing notes.  Pertinent labs & imaging results that were available during my care of the patient were reviewed by me and considered in my medical decision making (see chart for details).        Patient presents with abdominal pain.  Was just discharged yesterday after being admitted to [facility] for the same.  He has stage IV esophageal cancer.  He is overall nontoxic-appearing and vital signs are reassuring.  His abdominal exam is fairly benign; however, given his local invasion of his esophageal cancer, he certainly has reason for pain.  Lab work was obtained.  Additionally an acute abdominal series to evaluate for free air.  Patient was given ketamine for analgesia in an attempt to hit additional receptors and prevent constipation.  He was also given IV Zofran.  Patient signed out to oncoming provider.  7:26 AM Was informed by nursing at time of signout that patient was having ill effects to ketamine.  I checked on the patient.  He stated that he did not like the way ketamine made his feel.  No obvious allergic response.  He states "why didn't you tell me you were giving me that."  I discussed with him that I felt that it was a reasonable adjunct of pain medication given his tolerance to opiates and ongoing issues with abdominal pain.  He took 2 doses of OxyContin prior to arrival.  I also felt that his propensity for constipation in the setting of ongoing abdominal pain was a consideration.  I have requested the nursing document that he does not like the way the ketamine makes him feel.  I dosed him  1 dose of Dilaudid.  Dr. Rogene Houston to follow-up with [facility] and reevaluate.  Of note, patient is also complaining regarding nursing staff.  He is requesting contact information for patient applicant.  I have discussed with him that the [facility] has been contacted.  Final Clinical Impressions(s) / ED Diagnoses   Final diagnoses:  Generalized abdominal pain    ED Discharge Orders    None       Horton, Barbette Hair, MD 05/10/19 [id number]    Merryl Hacker, MD 05/10/19 [id number]    Merryl Hacker, MD 05/10/19 [phone]

## 2019-05-10 NOTE — ED Provider Notes (Addendum)
Discussed with hematology oncology service at [facility].  Dr. Harvie Heck, has agreed to take patient back on the service.  Currently they do not have any beds.  Did have a discussion to see if there was any thoughts on pain management.  They were not able to offer anything.  Have been giving patient hydromorphone without significant help.  May try fentanyl.  Purpose of the admission would be for an intractable abdominal pain and pain control.   Fredia Sorrow, MD 05/10/19 1108  Patient now states that he wants to go home take his oral pain medicine and then he may go directly to the emergency department at [facility].  He knows that [facility] is accepted him for admission but he also is aware that no bed was currently available it may not be till late tonight.  We will sign patient out is at discharge since based on patient's x-rays and labs there was no evidence of anything life-threatening or dangerous the admission just was for pain control.    Fredia Sorrow, MD 05/10/19 1304

## 2019-05-10 NOTE — ED Notes (Addendum)
Pt informed about medication orders and rationale. Pt agreed to try treatment.  Pt given medications over slow push. Pt unhappy with the way medication made him feel. Does not want to have medication "ever again."  EDP asked to go to room.

## 2019-05-10 NOTE — ED Notes (Signed)
Have not been able to keep pt on cardiac monitor this shift.  Pt is constantly crawling out of bed and removing monitoring devices.  Pt has periods of severe pain and not able to lay in bed.  Wife at bedside.

## 2019-05-10 NOTE — ED Notes (Signed)
Gave  Transfer Coordinator a [place] a call to let them know that Pt was leaving and going to ER at [facility].

## 2019-05-10 NOTE — ED Triage Notes (Addendum)
Pt here for abd pain.  Recently seen at [facility] and transferred to [facility]. Gets all of cancer tx at [facility].  per pt he was told to go back to [facility] if abd pain returns. Came here. Has hx of esophageal cancer .

## 2019-05-11 LAB — SARS CORONAVIRUS 2 (TAT 6-24 HRS): SARS Coronavirus 2: NEGATIVE

## 2019-05-13 ENCOUNTER — Encounter: Payer: Self-pay | Admitting: Internal Medicine

## 2019-05-16 ENCOUNTER — Inpatient Hospital Stay
Admission: RE | Admit: 2019-05-16 | Discharge: 2019-05-16 | Disposition: A | Discharge status: Home / Self Care | Payer: Medicaid Other | Source: Ambulatory Visit

## 2019-06-03 ENCOUNTER — Emergency Department (HOSPITAL_COMMUNITY): Payer: Medicaid Other

## 2019-06-03 ENCOUNTER — Emergency Department (HOSPITAL_COMMUNITY)
Admission: EM | Admit: 2019-06-03 | Discharge: 2019-06-03 | Disposition: A | Discharge status: Home / Self Care | Payer: Medicaid Other | Attending: Emergency Medicine | Admitting: Emergency Medicine

## 2019-06-03 ENCOUNTER — Other Ambulatory Visit: Payer: Self-pay

## 2019-06-03 ENCOUNTER — Encounter (HOSPITAL_COMMUNITY): Payer: Self-pay

## 2019-06-03 DIAGNOSIS — D001 Carcinoma in situ of esophagus: Secondary | ICD-10-CM | POA: Insufficient documentation

## 2019-06-03 DIAGNOSIS — E119 Type 2 diabetes mellitus without complications: Secondary | ICD-10-CM | POA: Insufficient documentation

## 2019-06-03 DIAGNOSIS — Z79899 Other long term (current) drug therapy: Secondary | ICD-10-CM | POA: Insufficient documentation

## 2019-06-03 DIAGNOSIS — R1084 Generalized abdominal pain: Secondary | ICD-10-CM | POA: Insufficient documentation

## 2019-06-03 DIAGNOSIS — I10 Essential (primary) hypertension: Secondary | ICD-10-CM | POA: Diagnosis not present

## 2019-06-03 DIAGNOSIS — F17210 Nicotine dependence, cigarettes, uncomplicated: Secondary | ICD-10-CM | POA: Diagnosis not present

## 2019-06-03 LAB — COMPREHENSIVE METABOLIC PANEL
ALT: 18 U/L (ref 0–44)
AST: 19 U/L (ref 15–41)
Albumin: 3.8 g/dL (ref 3.5–5.0)
Alkaline Phosphatase: 71 U/L (ref 38–126)
Anion gap: 14 (ref 5–15)
BUN: 15 mg/dL (ref 6–20)
CO2: 23 mmol/L (ref 22–32)
Calcium: 9.4 mg/dL (ref 8.9–10.3)
Chloride: 99 mmol/L (ref 98–111)
Creatinine, Ser: 1.02 mg/dL (ref 0.61–1.24)
GFR calc Af Amer: >60 mL/min (ref >60)
GFR calc non Af Amer: >60 mL/min (ref >60)
Glucose, Bld: 160 mg/dL — ABNORMAL HIGH (ref 70–99)
Potassium: 3.6 mmol/L (ref 3.5–5.1)
Sodium: 136 mmol/L (ref 135–145)
Total Bilirubin: 0.6 mg/dL (ref 0.3–1.2)
Total Protein: 7.5 g/dL (ref 6.5–8.1)

## 2019-06-03 LAB — CBC WITH DIFFERENTIAL/PLATELET
Abs Immature Granulocytes: 0.03 10*3/uL (ref 0.00–0.07)
Basophils Absolute: 0.1 10*3/uL (ref 0.0–0.1)
Basophils Relative: 1 %
Eosinophils Absolute: 0 10*3/uL (ref 0.0–0.5)
Eosinophils Relative: 0 %
HCT: 38.9 % — ABNORMAL LOW (ref 39.0–52.0)
Hemoglobin: 12.7 g/dL — ABNORMAL LOW (ref 13.0–17.0)
Immature Granulocytes: 0 %
Lymphocytes Relative: 21 %
Lymphs Abs: 2 10*3/uL (ref 0.7–4.0)
MCH: 24.8 pg — ABNORMAL LOW (ref 26.0–34.0)
MCHC: 32.6 g/dL (ref 30.0–36.0)
MCV: 75.8 fL — ABNORMAL LOW (ref 80.0–100.0)
Monocytes Absolute: 0.7 10*3/uL (ref 0.1–1.0)
Monocytes Relative: 7 %
Neutro Abs: 6.5 10*3/uL (ref 1.7–7.7)
Neutrophils Relative %: 71 %
Platelets: 399 10*3/uL (ref 150–400)
RBC: 5.13 MIL/uL (ref 4.22–5.81)
RDW: 14.3 % (ref 11.5–15.5)
WBC: 9.2 10*3/uL (ref 4.0–10.5)
nRBC: 0 % (ref 0.0–0.2)

## 2019-06-03 LAB — LACTIC ACID, PLASMA: Lactic Acid, Venous: 2.2 mmol/L (ref 0.5–1.9)

## 2019-06-03 LAB — LIPASE, BLOOD: Lipase: 25 U/L (ref 11–51)

## 2019-06-03 MED ORDER — MORPHINE SULFATE (PF) 4 MG/ML IV SOLN
10.0000 mg | INTRAVENOUS | Status: DC | PRN
  Administered 2019-06-03 (×2): 10 mg via INTRAVENOUS
  Filled 2019-06-03 (×2): qty 3

## 2019-06-03 MED ORDER — SODIUM CHLORIDE 0.9 % IV BOLUS
1000.0000 mL | Freq: Once | INTRAVENOUS | Status: AC
  Administered 2019-06-03: 1000 mL via INTRAVENOUS

## 2019-06-03 MED ORDER — KETOROLAC TROMETHAMINE 30 MG/ML IJ SOLN
15.0000 mg | Freq: Once | INTRAMUSCULAR | Status: AC
  Administered 2019-06-03: 15 mg via INTRAVENOUS
  Filled 2019-06-03: qty 1

## 2019-06-03 MED ORDER — HYDROMORPHONE HCL 1 MG/ML IJ SOLN: 1.0000 mg | INTRAMUSCULAR | Status: DC | PRN

## 2019-06-03 MED ORDER — HYDROMORPHONE HCL 1 MG/ML IJ SOLN
1.0000 mg | Freq: Once | INTRAMUSCULAR | Status: AC
  Administered 2019-06-03: 1 mg via INTRAVENOUS
  Filled 2019-06-03: qty 1

## 2019-06-03 MED ORDER — DIAZEPAM 5 MG/ML IJ SOLN
2.5000 mg | INTRAMUSCULAR | Status: AC
  Administered 2019-06-03: 2.5 mg via INTRAVENOUS
  Filled 2019-06-03: qty 2

## 2019-06-03 NOTE — ED Triage Notes (Signed)
Wife reports pt has stage IV esophageal cancer.  C/O severe abd pain.  Denies n/v/d.  Pt diaphoretic, restless, moaning.    Pt has g tube.

## 2019-06-03 NOTE — ED Provider Notes (Signed)
[facility] EMERGENCY DEPARTMENT Provider Note   CSN: [id number] Arrival date & time: 06/03/19  1138     History   Chief Complaint Chief Complaint  Patient presents with  . Abdominal Pain    HPI Maxwell Henderson is a 53 y.o. male.     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  Because of this aggressive cancer he has had a gastric feeding tube that has been placed, he has not had chemotherapy in over a month and a half and because of the progression of the disease he was supposed to be changed on to an immunomodulator or some type of immune medication.  This is to be started soon at [facility] where he gets his primary care.  The patient has had several visits to the emergency department because of uncontrolled pain and seems to present today with similar symptoms.  He has a recent hospitalization where he presented with similar sx - his wife who is the primary historian due to the severe pain and inability of the patient to give history reports that he had 2 weeks in the hospital during which time he had xrays, pain medicines and "burning of the cells of his stomach" - and then got better.  See below for this documentation.  Review of the medical record shows that the patient was seen on May 28, 2019 in a virtual visit by the [facility] in [place].  Thankfully this note very clearly documents what happens during the patient's admission where he was admitted initially for pain control, he had been using his pain medication inappropriately such that he was trying to open capsules that were supposed to be extended release and mixing it with applesauce rather than taking it in the traditional way he was discharged on a fentanyl patch and oxycodone immediate release 20 mg every 4 hours.  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  He was supposed to follow-up with palliative care in the outpatient setting for medication management of his chronic pain.  The discharge summary from the patient's most recent admission from September 26 through October 8 says that he was discharged from the hospital with multiple painful diagnoses including acid reflux, opioid-induced constipation and cancer related pain and asked to take the following  Bentyl as needed Pepcid PPI Ongoing pain control with his opiate medications  The patient is currently taking fentanyl patch 100 mcg/h, Dilaudid 4 mg every 4 hours as needed but he was stating that that was not helping as much and wanted to go back to the oxycodone.   Past Medical History:  Diagnosis Date  . Diabetes mellitus without complication ([place])   . Hypertension   . 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 Primary esophageal adenocarcinoma ([place]) 01/29/2017    Patient Active Problem List   Diagnosis Date Noted  . Abdominal pain 05/03/2019  . GERD (gastroesophageal reflux disease) 05/03/2019  . 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 Intractable abdominal pain 02/19/2019  . Esophageal cancer ([place]) 02/03/2019  . Goals of care, counseling/discussion 11/28/2018  . Type 2 diabetes mellitus without complication, without long-term current use of insulin ([place])   .  Essential hypertension   . SIRS (systemic inflammatory response syndrome) ([place]) 12/11/2017  . HCAP (healthcare-associated pneumonia) 12/11/2017  . Infection due to parainfluenza virus 3 12/11/2017  . PNA (pneumonia) 12/10/2017  . Hypokalemia 12/10/2017  . 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 Primary esophageal adenocarcinoma ([place]) 01/29/2017  . Dysphagia 01/05/2017    Past  Surgical History:  Procedure Laterality Date  . ESOPHAGOGASTRODUODENOSCOPY (EGD) WITH PROPOFOL N/A 01/16/2017   Procedure: ESOPHAGOGASTRODUODENOSCOPY (EGD) WITH PROPOFOL;  Surgeon: [name], MD;  Location: [facility] ENDO SUITE;  Service: Endoscopy;  Laterality: N/A;  730   . IR FLUORO GUIDE PORT INSERTION RIGHT  02/13/2017  . IR US GUIDE VASC ACCESS RIGHT  02/13/2017  . lipoma removal    . PORTA CATH INSERTION Right 02/13/2017  . SAVORY DILATION N/A 01/16/2017   Procedure: SAVORY DILATION;  Surgeon: [name], MD;  Location: [facility] ENDO SUITE;  Service: Endoscopy;  Laterality: N/A;        Home Medications    Prior to Admission medications   Medication Sig Start Date End Date Taking? Authorizing Provider  B Complex-C (B-COMPLEX WITH VITAMIN C) tablet Take 1 tablet by mouth daily. 09/28/17  Yes Holley Bouche, NP  busPIRone (BUSPAR) 5 MG tablet Take 5 mg by mouth 3 (three) times daily. 05/22/19  Yes [provider]  chlorproMAZINE (THORAZINE) 25 MG tablet Take 1 tablet (25 mg total) by mouth 3 (three) times daily. 02/17/19  Yes Derek Jack, MD  dicyclomine (BENTYL) 10 MG capsule Take 10 mg by mouth 4 (four) times daily as needed for spasms.  05/22/19  Yes [provider]  famotidine (PEPCID) 20 MG tablet Take 20 mg by mouth 2 (two) times daily. 05/22/19  Yes [provider]  fentaNYL (DURAGESIC) 50 MCG/HR Place 1 patch onto the skin every 3 (three) days. 05/11/19  Yes [provider]  gabapentin (NEURONTIN) 250 MG/5ML solution Take 2 mLs by mouth 3 (three) times daily. 05/22/19  Yes [provider]  Hyoscyamine Sulfate SL (LEVSIN/SL) 0.125 MG SUBL Place 1 tablet under the tongue every 4 (four) hours as needed. 12/06/18  Yes Derek Jack, MD  loratadine (CLARITIN) 10 MG tablet Take 1 tablet by mouth daily. 01/30/19  Yes [provider]  LORazepam (ATIVAN) 1 MG tablet Take 1 mg by mouth every 6 (six) hours as needed for anxiety.   05/22/19  Yes [provider]  naloxegol oxalate (MOVANTIK) 25 MG TABS tablet Take by mouth. 05/22/19  Yes [provider]  Oxycodone HCl 20 MG TABS Take 1 tablet (20 mg total) by mouth every 4 (four) hours as needed. 03/03/19  Yes Derek Jack, MD  pantoprazole (PROTONIX) 40 MG tablet Take 1 tablet (40 mg total) by mouth 2 (two) times daily. Take 30 minutes before breakfast 05/21/18  Yes Derek Jack, MD  polyethylene glycol (MIRALAX / GLYCOLAX) 17 g packet Take 17 g by mouth daily.  05/22/19 05/21/20 Yes [provider]  senna-docusate (SENOKOT-S) 8.6-50 MG tablet Take 2 tablets by mouth daily as needed for mild constipation or moderate constipation.  05/22/19 05/21/20 Yes [provider]  sorbitol 70 % SOLN Take 30 mLs by mouth See admin instructions. If no bowel movement in over 24hours, take 40ml by mouth every 2 hours as needed until bowel movement is achieved 05/23/19  Yes [provider]  tamsulosin (FLOMAX) 0.4 MG CAPS capsule Take 1 capsule (0.4 mg total) by mouth at bedtime. 01/13/19  Yes Katragadda,  Dirk Dress, MD  thiamine 100 MG tablet Take 1 tablet by mouth daily. 02/13/19  Yes [provider]  amLODipine (NORVASC) 10 MG tablet Take 1 tablet by mouth once daily Patient not taking: Reported on 06/03/2019 12/18/18   Francene Finders L, NP-C  Lactulose 20 GM/30ML SOLN Take 30 mLs (20 g total) by mouth 2 (two) times a day. Patient not taking: Reported on 05/03/2019 02/17/19   Derek Jack, MD  prochlorperazine (COMPAZINE) 10 MG tablet TAKE 1 TABLET BY MOUTH EVERY 6 HOURS AS NEEDED FOR NAUSEA AND VOMITING Patient not taking: No sig reported 01/14/19   Glennie Isle, NP-C    Family History Family History  Problem Relation Age of Onset  . Prostate cancer Father   . Colon cancer Neg Hx   . Colon polyps Neg Hx     Social History Social History   Tobacco Use  . Smoking status: Current Some Day Smoker    Packs/day: 0.25     Types: Cigarettes  . Smokeless tobacco: Never Used  Substance Use Topics  . Alcohol use: No    Comment: hx heavy alcohol, hasn't drank in 22 yrs  . Drug use: Yes    Types: Marijuana     Allergies   Ketamine and related   Review of Systems Review of Systems  All other systems reviewed and are negative.    Physical Exam Updated Vital Signs BP (!) 165/118   Pulse (!) 128   Temp 98.5 F (36.9 C) (Oral)   Resp (!) 30   Ht 1.854 m (6\' 1" )   Wt 79 kg   SpO2 100%   BMI 22.98 kg/m   Physical Exam Vitals signs and nursing note reviewed.  Constitutional:      General: He is in acute distress.     Appearance: He is diaphoretic.  HENT:     Head: Normocephalic and atraumatic.     Mouth/Throat:     Pharynx: No oropharyngeal exudate.  Eyes:     General: No scleral icterus.       Right eye: No discharge.        Left eye: No discharge.     Conjunctiva/sclera: Conjunctivae normal.     Pupils: Pupils are equal, round, and reactive to light.  Neck:     Musculoskeletal: Normal range of motion and neck supple.     Thyroid: No thyromegaly.     Vascular: No JVD.  Cardiovascular:     Rate and Rhythm: Regular rhythm. Tachycardia present.     Heart sounds: Normal heart sounds. No murmur. No friction rub. No gallop.   Pulmonary:     Effort: Pulmonary effort is normal. No respiratory distress.     Breath sounds: Normal breath sounds. No wheezing or rales.  Abdominal:     General: Bowel sounds are normal. There is no distension.     Palpations: Abdomen is soft. There is no mass.     Tenderness: There is abdominal tenderness.     Comments: Mid abdominal tenderness, the patient is guarding diffusely  Musculoskeletal: Normal range of motion.        General: No tenderness.  Lymphadenopathy:     Cervical: No cervical adenopathy.  Skin:    General: Skin is warm.     Findings: No erythema or rash.  Neurological:     Mental Status: He is alert.     Coordination: Coordination normal.   Psychiatric:        Behavior: Behavior normal.  ED Treatments / Results  Labs (all labs ordered are listed, but only abnormal results are displayed) Labs Reviewed  CBC WITH DIFFERENTIAL/PLATELET - Abnormal; Notable for the following components:      Result Value   Hemoglobin 12.7 (*)    HCT 38.9 (*)    MCV 75.8 (*)    MCH 24.8 (*)    All other components within normal limits  COMPREHENSIVE METABOLIC PANEL - Abnormal; Notable for the following components:   Glucose, Bld 160 (*)    All other components within normal limits  LACTIC ACID, PLASMA - Abnormal; Notable for the following components:   Lactic Acid, Venous 2.2 (*)    All other components within normal limits  LIPASE, BLOOD    EKG EKG Interpretation  Date/Time:  Tuesday June 03 2019 11:51:11 EDT Ventricular Rate:  154 PR Interval:    QRS Duration: 86 QT Interval:  256 QTC Calculation: 410 R Axis:   87 Text Interpretation:  Sinus tachycardia Multiform ventricular premature complexes Probable anteroseptal infarct, old Nonspecific T abnormalities, lateral leads Baseline wander in lead(s) I II aVR V2 V3 V4 V5 V6 Since last tracing rate faster Confirmed by Noemi Chapel [phone]) on 06/03/2019 11:56:34 AM   Radiology Dg Chest Port 1 View  Result Date: 06/03/2019 CLINICAL DATA:  Chest pain EXAM: PORTABLE CHEST 1 VIEW COMPARISON:  02/02/2019 FINDINGS: The heart size and mediastinal contours are within normal limits. Right chest port catheter. Both lungs are clear. The visualized skeletal structures are unremarkable. IMPRESSION: No acute abnormality of the lungs. Electronically Signed   By: Eddie Candle M.D.   On: 06/03/2019 12:59    Procedures Procedures (including critical care time)  Medications Ordered in ED Medications  sodium chloride 0.9 % bolus 1,000 mL (0 mLs Intravenous Stopped 06/03/19 1438)  HYDROmorphone (DILAUDID) injection 1 mg (1 mg Intravenous Given 06/03/19 1229)  HYDROmorphone (DILAUDID)  injection 1 mg (1 mg Intravenous Given 06/03/19 1322)  diazepam (VALIUM) injection 2.5 mg (2.5 mg Intravenous Given 06/03/19 1401)  HYDROmorphone (DILAUDID) injection 1 mg (1 mg Intravenous Given 06/03/19 1442)     Initial Impression / Assessment and Plan / ED Course  I have reviewed the triage vital signs and the nursing notes.  Pertinent labs & imaging results that were available during my care of the patient were reviewed by me and considered in my medical decision making (see chart for details).  Clinical Course as of Jun 02 1542  Tue Jun 03, 2019  1357 I have reevaluated the patient several times - he continues to have severe pain and diaphoresis and shaking due to pain - has had some variability in his heart rate ranging from 109 - 140.  He is currently stating pain is 8/10, he is agreeable to trying Valium to help with possible muscle spasm involvement -    [BM]    Clinical Course User Index [BM] Noemi Chapel, MD      The patient appears overall very uncomfortable, I suspect the majority of this is related to the conditions as above as noted in the history however the patient is severely tachycardic and ill-appearing and diaphoretic, I will get a chest x-ray to make sure he has not had any signs of [place]erhaave's as he said that he was nauseated and dry heaving this morning.  He does not have any vomiting of blood, no blood in his stool, he is taking the medications as prescribed and his last dose of hydromorphone was 1 hour prior to arrival.  I discussed the care with Dr. Kendall Flack who is the accepting provider on the hematology oncology service at [facility].  He accepts the patient in transfer.  There is no room at this time, no beds available.  The patient will continue to receive as needed pain medication however we will need to go to Dilaudid every 2 hours instead of every 1 hour to make sure that he is not being inappropriately dosed, he will need to stay on a cardiac  monitor, nursing made aware of indication for holding medicines.  Final Clinical Impressions(s) / ED Diagnoses   Final diagnoses:  Generalized abdominal pain      Noemi Chapel, MD 06/04/19 [phone]

## 2019-06-03 NOTE — ED Provider Notes (Signed)
  Physical Exam  BP (!) 130/96   Pulse (!) 127   Temp 98.5 F (36.9 C) (Oral)   Resp 14   Ht 6\' 1"  (1.854 m)   Wt 79 kg   SpO2 100%   BMI 22.98 kg/m   Physical Exam  ED Course/Procedures   Clinical Course as of Jun 02 2028  Tue Jun 03, 2019  1357 I have reevaluated the patient several times - he continues to have severe pain and diaphoresis and shaking due to pain - has had some variability in his heart rate ranging from 109 - 140.  He is currently stating pain is 8/10, he is agreeable to trying Valium to help with possible muscle spasm involvement -    [BM]    Clinical Course User Index [BM] Noemi Chapel, MD    Procedures  MDM    Patient was awaiting transfer to [facility] at the time of shift change.  I was peripherally made aware of the complaints and the plan.  Patient had received multiple rounds of opiates without significant relief.  He is allergic to ketamine.  He was just discharged from [facility] for pain issues.  He has complex cancer history.  I reassessed the patient at the request of the nurse.  He was given morphine and Toradol.  He had already received Dilaudid.  At 7:00 I was informed by the nursing staff that patient reported that his pain was improving.  He wanted to go home if his pain continued to get better.  I reassessed him again.  Patient is pretty much pain-free now and wants to go home.  His heart rate is 101.  He is not diaphoretic.  He appears comfortable and wife is willing to take him home and bring him back if needed.  Stable for discharge.     Varney Biles, MD 06/03/19 2032

## 2019-06-03 NOTE — ED Notes (Signed)
CRITICAL VALUE ALERT  Critical Value:  Lactic acid 2.2  Date & Time Notied:  06/03/2019, 1256  Provider Notified: Dr. Sabra Heck  Orders Received/Actions taken: no new orders

## 2019-06-03 NOTE — ED Notes (Signed)
PT complaining of pain 10 of 10 and diaphoresis noted with wife at bedside. MD made aware and is now at bedside talking with patient and his wife.

## 2019-06-03 NOTE — Discharge Instructions (Addendum)
We saw in the ER for abdominal pain.  Over time your pain has resolved.  Recommend that he continue with the follow-up at [facility] for optimal management of your symptoms.

## 2019-06-07 ENCOUNTER — Other Ambulatory Visit: Payer: Self-pay

## 2019-06-07 ENCOUNTER — Emergency Department (HOSPITAL_COMMUNITY)
Admission: EM | Admit: 2019-06-07 | Discharge: 2019-06-07 | Disposition: A | Discharge status: Home / Self Care | Payer: Medicaid Other | Attending: Emergency Medicine | Admitting: Emergency Medicine

## 2019-06-07 ENCOUNTER — Emergency Department (HOSPITAL_COMMUNITY): Payer: Medicaid Other

## 2019-06-07 ENCOUNTER — Encounter (HOSPITAL_COMMUNITY): Payer: Self-pay | Admitting: Emergency Medicine

## 2019-06-07 DIAGNOSIS — E119 Type 2 diabetes mellitus without complications: Secondary | ICD-10-CM | POA: Insufficient documentation

## 2019-06-07 DIAGNOSIS — F17210 Nicotine dependence, cigarettes, uncomplicated: Secondary | ICD-10-CM | POA: Insufficient documentation

## 2019-06-07 DIAGNOSIS — Z79899 Other long term (current) drug therapy: Secondary | ICD-10-CM | POA: Insufficient documentation

## 2019-06-07 DIAGNOSIS — C159 Malignant neoplasm of esophagus, unspecified: Secondary | ICD-10-CM | POA: Insufficient documentation

## 2019-06-07 DIAGNOSIS — I10 Essential (primary) hypertension: Secondary | ICD-10-CM | POA: Diagnosis not present

## 2019-06-07 DIAGNOSIS — Z20828 Contact with and (suspected) exposure to other viral communicable diseases: Secondary | ICD-10-CM | POA: Diagnosis not present

## 2019-06-07 DIAGNOSIS — R079 Chest pain, unspecified: Secondary | ICD-10-CM

## 2019-06-07 LAB — HEPATIC FUNCTION PANEL
ALT: 22 U/L (ref 0–44)
AST: 23 U/L (ref 15–41)
Albumin: 3.5 g/dL (ref 3.5–5.0)
Alkaline Phosphatase: 73 U/L (ref 38–126)
Bilirubin, Direct: 0.1 mg/dL (ref 0.0–0.2)
Indirect Bilirubin: 0.5 mg/dL (ref 0.3–0.9)
Total Bilirubin: 0.6 mg/dL (ref 0.3–1.2)
Total Protein: 6.9 g/dL (ref 6.5–8.1)

## 2019-06-07 LAB — SARS CORONAVIRUS 2 BY RT PCR (HOSPITAL ORDER, PERFORMED IN ~~LOC~~ HOSPITAL LAB): SARS Coronavirus 2: NEGATIVE

## 2019-06-07 LAB — CBC
HCT: 32.6 % — ABNORMAL LOW (ref 39.0–52.0)
Hemoglobin: 11 g/dL — ABNORMAL LOW (ref 13.0–17.0)
MCH: 25.5 pg — ABNORMAL LOW (ref 26.0–34.0)
MCHC: 33.7 g/dL (ref 30.0–36.0)
MCV: 75.5 fL — ABNORMAL LOW (ref 80.0–100.0)
Platelets: 308 10*3/uL (ref 150–400)
RBC: 4.32 MIL/uL (ref 4.22–5.81)
RDW: 13.9 % (ref 11.5–15.5)
WBC: 7.4 10*3/uL (ref 4.0–10.5)
nRBC: 0 % (ref 0.0–0.2)

## 2019-06-07 LAB — BASIC METABOLIC PANEL
Anion gap: 11 (ref 5–15)
BUN: 13 mg/dL (ref 6–20)
CO2: 23 mmol/L (ref 22–32)
Calcium: 8.7 mg/dL — ABNORMAL LOW (ref 8.9–10.3)
Chloride: 102 mmol/L (ref 98–111)
Creatinine, Ser: 0.86 mg/dL (ref 0.61–1.24)
GFR calc Af Amer: >60 mL/min (ref >60)
GFR calc non Af Amer: >60 mL/min (ref >60)
Glucose, Bld: 164 mg/dL — ABNORMAL HIGH (ref 70–99)
Potassium: 3.6 mmol/L (ref 3.5–5.1)
Sodium: 136 mmol/L (ref 135–145)

## 2019-06-07 LAB — TROPONIN I (HIGH SENSITIVITY)
Troponin I (High Sensitivity): <2 ng/L (ref <18)
Troponin I (High Sensitivity): <2 ng/L (ref <18)

## 2019-06-07 MED ORDER — SODIUM CHLORIDE 0.9% FLUSH
3.0000 mL | Freq: Once | INTRAVENOUS | Status: AC
  Administered 2019-06-07: 13:00:00 3 mL via INTRAVENOUS

## 2019-06-07 MED ORDER — KETOROLAC TROMETHAMINE 30 MG/ML IJ SOLN
30.0000 mg | Freq: Once | INTRAMUSCULAR | Status: AC
  Administered 2019-06-07: 30 mg via INTRAVENOUS
  Filled 2019-06-07: qty 1

## 2019-06-07 MED ORDER — ONDANSETRON HCL 4 MG/2ML IJ SOLN
4.0000 mg | Freq: Once | INTRAMUSCULAR | Status: AC
  Administered 2019-06-07: 4 mg via INTRAVENOUS
  Filled 2019-06-07: qty 2

## 2019-06-07 MED ORDER — SODIUM CHLORIDE 0.9 % IV BOLUS
500.0000 mL | Freq: Once | INTRAVENOUS | Status: AC
  Administered 2019-06-07: 500 mL via INTRAVENOUS

## 2019-06-07 MED ORDER — HYDROMORPHONE HCL 1 MG/ML IJ SOLN
1.0000 mg | Freq: Once | INTRAMUSCULAR | Status: AC
  Administered 2019-06-07: 1 mg via INTRAVENOUS
  Filled 2019-06-07: qty 1

## 2019-06-07 MED ORDER — LORAZEPAM 2 MG/ML IJ SOLN
1.0000 mg | Freq: Once | INTRAMUSCULAR | Status: AC
  Administered 2019-06-07: 1 mg via INTRAVENOUS
  Filled 2019-06-07: qty 1

## 2019-06-07 MED ORDER — MORPHINE SULFATE (PF) 4 MG/ML IV SOLN
4.0000 mg | Freq: Once | INTRAVENOUS | Status: AC
  Administered 2019-06-07: 4 mg via INTRAVENOUS
  Filled 2019-06-07: qty 1

## 2019-06-07 NOTE — ED Notes (Signed)
pain is improving

## 2019-06-07 NOTE — ED Provider Notes (Signed)
[facility] EMERGENCY DEPARTMENT Provider Note   CSN: [id number] Arrival date & time: 06/07/19  1152     History   Chief Complaint Chief Complaint  Patient presents with  . Chest Pain    HPI Maxwell Henderson is a 53 y.o. male.     HPI   He is here for evaluation of sudden onset chest pain, shortly prior to arrival.  He has esophageal cancer, currently under treatment, with observation, on chemotherapy holiday.  No recent fever, chills, known Covid exposure, nausea or vomiting.  He is here with his wife who gives the history.  She reports that he had a pretty good bowel movement this morning.  He has chronic constipation associated with chronic use of narcotics.  There are no other known modifying factors.  Past Medical History:  Diagnosis Date  . Diabetes mellitus without complication ([place])   . Hypertension   . 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 Abdominal pain 05/03/2019  . GERD (gastroesophageal reflux disease) 05/03/2019  . 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 Intractable abdominal pain 02/19/2019  . Esophageal cancer ([place]) 02/03/2019  . Goals of care, counseling/discussion 11/28/2018  . Type 2 diabetes mellitus without complication, without long-term current use of insulin ([place])   . Essential hypertension   . SIRS (systemic inflammatory response syndrome) ([place]) 12/11/2017  . HCAP (healthcare-associated pneumonia) 12/11/2017  . Infection due to parainfluenza virus 3 12/11/2017  . PNA (pneumonia) 12/10/2017  . Hypokalemia 12/10/2017  . 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 Dysphagia 01/05/2017    Past Surgical History:  Procedure Laterality Date  . ESOPHAGOGASTRODUODENOSCOPY (EGD) WITH PROPOFOL N/A 01/16/2017   Procedure:  ESOPHAGOGASTRODUODENOSCOPY (EGD) WITH PROPOFOL;  Surgeon: [name], MD;  Location: [facility] ENDO SUITE;  Service: Endoscopy;  Laterality: N/A;  730   . IR FLUORO GUIDE PORT INSERTION RIGHT  02/13/2017  . IR US GUIDE VASC ACCESS RIGHT  02/13/2017  . lipoma removal    . PORTA CATH INSERTION Right 02/13/2017  . SAVORY DILATION N/A 01/16/2017   Procedure: SAVORY DILATION;  Surgeon: [name], MD;  Location: [facility] ENDO SUITE;  Service: Endoscopy;  Laterality: N/A;        Home Medications    Prior to Admission medications   Medication Sig Start Date End Date Taking? Authorizing Provider  amLODipine (NORVASC) 10 MG tablet Take 1 tablet by mouth once daily Patient not taking: Reported on 06/03/2019 12/18/18   Lockamy, Randi L, NP-C  B Complex-C (B-COMPLEX WITH VITAMIN C) tablet Take 1 tablet by mouth daily. 09/28/17   Holley Bouche, NP  busPIRone (BUSPAR) 5 MG tablet Take 5 mg by mouth 3 (three) times daily. 05/22/19   [provider]  chlorproMAZINE (THORAZINE) 25 MG tablet Take 1 tablet (25 mg total) by mouth 3 (three) times daily. 02/17/19   Derek Jack, MD  dicyclomine (BENTYL) 10 MG capsule Take 10 mg by mouth 4 (four) times daily as needed for spasms.  05/22/19   [provider]  famotidine (PEPCID) 20 MG tablet Take 20 mg by mouth 2 (two) times daily. 05/22/19   [provider]  fentaNYL (DURAGESIC) 50 MCG/HR Place 1 patch onto the skin every 3 (three) days. 05/11/19   [provider]  gabapentin (NEURONTIN) 250 MG/5ML  solution Take 2 mLs by mouth 3 (three) times daily. 05/22/19   [provider]  Hyoscyamine Sulfate SL (LEVSIN/SL) 0.125 MG SUBL Place 1 tablet under the tongue every 4 (four) hours as needed. 12/06/18   Derek Jack, MD  Lactulose 20 GM/30ML SOLN Take 30 mLs (20 g total) by mouth 2 (two) times a day. Patient not taking: Reported on 05/03/2019 02/17/19   Derek Jack, MD  loratadine (CLARITIN) 10 MG tablet Take 1  tablet by mouth daily. 01/30/19   [provider]  LORazepam (ATIVAN) 1 MG tablet Take 1 mg by mouth every 6 (six) hours as needed for anxiety.  05/22/19   [provider]  naloxegol oxalate (MOVANTIK) 25 MG TABS tablet Take by mouth. 05/22/19   [provider]  Oxycodone HCl 20 MG TABS Take 1 tablet (20 mg total) by mouth every 4 (four) hours as needed. 03/03/19   Derek Jack, MD  pantoprazole (PROTONIX) 40 MG tablet Take 1 tablet (40 mg total) by mouth 2 (two) times daily. Take 30 minutes before breakfast 05/21/18   Derek Jack, MD  polyethylene glycol (MIRALAX / GLYCOLAX) 17 g packet Take 17 g by mouth daily.  05/22/19 05/21/20  [provider]  prochlorperazine (COMPAZINE) 10 MG tablet TAKE 1 TABLET BY MOUTH EVERY 6 HOURS AS NEEDED FOR NAUSEA AND VOMITING Patient not taking: No sig reported 01/14/19   Lockamy, Randi L, NP-C  senna-docusate (SENOKOT-S) 8.6-50 MG tablet Take 2 tablets by mouth daily as needed for mild constipation or moderate constipation.  05/22/19 05/21/20  [provider]  sorbitol 70 % SOLN Take 30 mLs by mouth See admin instructions. If no bowel movement in over 24hours, take 34ml by mouth every 2 hours as needed until bowel movement is achieved 05/23/19   [provider]  tamsulosin (FLOMAX) 0.4 MG CAPS capsule Take 1 capsule (0.4 mg total) by mouth at bedtime. 01/13/19   Derek Jack, MD  thiamine 100 MG tablet Take 1 tablet by mouth daily. 02/13/19   [provider]    Family History Family History  Problem Relation Age of Onset  . Prostate cancer Father   . Colon cancer Neg Hx   . Colon polyps Neg Hx     Social History Social History   Tobacco Use  . Smoking status: Current Some Day Smoker    Packs/day: 0.25    Types: Cigarettes  . Smokeless tobacco: Never Used  Substance Use Topics  . Alcohol use: No    Comment: hx heavy alcohol, hasn't drank in 22 yrs  . Drug use: Yes    Types:  Marijuana     Allergies   Ketamine and related   Review of Systems Review of Systems  All other systems reviewed and are negative.    Physical Exam Updated Vital Signs BP (!) 149/101   Pulse (!) 115   Temp 97.6 F (36.4 C) (Oral)   Resp 16   Ht 6\' 1"  (1.854 m)   Wt 73 kg   SpO2 100%   BMI 21.24 kg/m   Physical Exam Vitals signs and nursing note reviewed.  Constitutional:      General: He is in acute distress.     Appearance: He is well-developed. He is ill-appearing. He is not toxic-appearing or diaphoretic.  HENT:     Head: Normocephalic and atraumatic.     Right Ear: External ear normal.     Left Ear: External ear normal.  Eyes:  Conjunctiva/sclera: Conjunctivae normal.     Pupils: Pupils are equal, round, and reactive to light.  Neck:     Musculoskeletal: Normal range of motion and neck supple.     Trachea: Phonation normal.  Cardiovascular:     Rate and Rhythm: Normal rate and regular rhythm.     Heart sounds: Normal heart sounds.  Pulmonary:     Effort: Pulmonary effort is normal.     Breath sounds: Normal breath sounds.  Abdominal:     Palpations: Abdomen is soft.     Tenderness: There is no abdominal tenderness.  Musculoskeletal: Normal range of motion.  Skin:    General: Skin is warm and dry.  Neurological:     Mental Status: He is alert and oriented to person, place, and time.     Cranial Nerves: No cranial nerve deficit.     Sensory: No sensory deficit.     Motor: No abnormal muscle tone.     Coordination: Coordination normal.  Psychiatric:        Behavior: Behavior normal.        Thought Content: Thought content normal.        Judgment: Judgment normal.      ED Treatments / Results  Labs (all labs ordered are listed, but only abnormal results are displayed) Labs Reviewed  CBC - Abnormal; Notable for the following components:      Result Value   Hemoglobin 11.0 (*)    HCT 32.6 (*)    MCV 75.5 (*)    MCH 25.5 (*)    All other  components within normal limits  BASIC METABOLIC PANEL - Abnormal; Notable for the following components:   Glucose, Bld 164 (*)    Calcium 8.7 (*)    All other components within normal limits  SARS CORONAVIRUS 2 BY RT PCR (HOSPITAL ORDER, [facility])  HEPATIC FUNCTION PANEL  TROPONIN I (HIGH SENSITIVITY)  TROPONIN I (HIGH SENSITIVITY)    EKG EKG Interpretation  Date/Time:  Saturday June 07 2019 11:57:53 EDT Ventricular Rate:  125 PR Interval:  144 QRS Duration: 96 QT Interval:  308 QTC Calculation: 444 R Axis:   87 Text Interpretation:  Sinus tachycardia Incomplete right bundle branch block Borderline ECG Since last tracing rate slower and no PVC. Persistent lateral nonspecific T wave abnormality Confirmed by Daleen Bo [phone]) on 06/07/2019 12:08:20 PM   Radiology Dg Chest 1 View  Result Date: 06/07/2019 CLINICAL DATA:  Left-sided chest pain and shortness of breath. History of esophageal cancer. EXAM: CHEST  1 VIEW COMPARISON:  Chest x-ray dated June 03, 2019. FINDINGS: Unchanged right chest wall port catheter. The heart size and mediastinal contours are within normal limits. Normal pulmonary vascularity. No focal consolidation, pleural effusion, or pneumothorax. No acute osseous abnormality. IMPRESSION: No active disease. Electronically Signed   By: Titus Dubin M.D.   On: 06/07/2019 12:47    Procedures .Critical Care Performed by: Daleen Bo, MD Authorized by: Daleen Bo, MD   Critical care provider statement:    Critical care time (minutes):  35   Critical care start time:  06/07/2019 12:15 PM   Critical care end time:  06/07/2019 3:10 PM   Critical care time was exclusive of:  Separately billable procedures and treating other patients   Critical care was time spent personally by me on the following activities:  Blood draw for specimens, development of treatment plan with patient or surrogate, discussions with consultants,  evaluation of patient's response to  treatment, examination of patient, obtaining history from patient or surrogate, ordering and performing treatments and interventions, ordering and review of laboratory studies, pulse oximetry, re-evaluation of patient's condition, review of old charts and ordering and review of radiographic studies   (including critical care time)  Medications Ordered in ED Medications  morphine 4 MG/ML injection 4 mg (has no administration in time range)  sodium chloride flush (NS) 0.9 % injection 3 mL (3 mLs Intravenous Given 06/07/19 1230)  sodium chloride 0.9 % bolus 500 mL (0 mLs Intravenous Stopped 06/07/19 1421)  HYDROmorphone (DILAUDID) injection 1 mg (1 mg Intravenous Given 06/07/19 1230)  ondansetron (ZOFRAN) injection 4 mg (4 mg Intravenous Given 06/07/19 1230)  LORazepam (ATIVAN) injection 1 mg (1 mg Intravenous Given 06/07/19 1335)  HYDROmorphone (DILAUDID) injection 1 mg (1 mg Intravenous Given 06/07/19 1333)  ketorolac (TORADOL) 30 MG/ML injection 30 mg (30 mg Intravenous Given 06/07/19 1419)     Initial Impression / Assessment and Plan / ED Course  I have reviewed the triage vital signs and the nursing notes.  Pertinent labs & imaging results that were available during my care of the patient were reviewed by me and considered in my medical decision making (see chart for details).  Clinical Course as of Jun 06 1510  Sat Jun 07, 2019  1306 No infiltrate or CHF, interpreted by me  DG Chest 1 View [EW]  1326 No relief from pain yet.  Additional medications ordered   [EW]  1411 He is resting more comfortably, at this time.  He requests Toradol for pain control.   [EW]  1503 He feels better after Toradol and requests a dose of Morphine.  I discussed the likely etiology of his pain with the patient and his wife, as his esophageal cancer.   [EW]  1507 Normal except glucose mildly elevated and calcium slightly low  Basic metabolic panel(!) [EW]    Clinical  Course User Index [EW] Daleen Bo, MD        Patient Vitals for the past 24 hrs:  BP Temp Temp src Pulse Resp SpO2 Height Weight  06/07/19 1430 (!) 149/101 - - (!) 115 16 100 % - -  06/07/19 1400 (!) 146/102 - - (!) 116 14 100 % - -  06/07/19 1205 (!) 108/95 97.6 F (36.4 C) Oral (!) 125 (!) 24 100 % - -  06/07/19 1203 - - - - - - 6\' 1"  (1.854 m) 73 kg     Medical Decision Making: Chest discomfort likely secondary to esophageal cancer.  Patient reportedly had a bowel movement today.  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  He is receiving palliative care for his esophageal cancer.  No evidence for esophageal perforation, ACS or pneumonia infiltrate/pneumothorax.  He has chronic pain for which he uses hydromorphone at home.  No acute conditions encountered, so will attempt to control patient's pain prior to disposition.   CRITICAL CARE-yes Performed by: Daleen Bo  Nursing Notes Reviewed/ Care Coordinated Applicable Imaging Reviewed Interpretation of Laboratory Data incorporated into ED treatment  Care to Dr. Roderic Palau to evaluate for pain control and consider discharge.  3:25 PM   Final Clinical Impressions(s) / ED Diagnoses   Final diagnoses:  Malignant neoplasm of esophagus, unspecified location [facility])  Chest pain, unspecified type    ED Discharge Orders    None       Daleen Bo, MD 06/07/19 1525

## 2019-06-07 NOTE — ED Triage Notes (Signed)
Patient c/o left side chest pain that started approx 15 minutes to arrival to ED. Per patient shortness of breath and weakness. Denies any nausea or vomiting. Denies any cardiac hx. Patient esophageal cancer.

## 2019-06-07 NOTE — Discharge Instructions (Addendum)
Follow up with your md next week if needed °

## 2019-06-14 ENCOUNTER — Emergency Department (HOSPITAL_COMMUNITY): Payer: Medicaid Other

## 2019-06-14 ENCOUNTER — Other Ambulatory Visit: Payer: Self-pay

## 2019-06-14 ENCOUNTER — Emergency Department (HOSPITAL_COMMUNITY)
Admission: EM | Admit: 2019-06-14 | Discharge: 2019-06-15 | Disposition: A | Discharge status: Home / Self Care | Payer: Medicaid Other | Attending: Emergency Medicine | Admitting: Emergency Medicine

## 2019-06-14 ENCOUNTER — Encounter (HOSPITAL_COMMUNITY): Payer: Self-pay | Admitting: Emergency Medicine

## 2019-06-14 DIAGNOSIS — Z79899 Other long term (current) drug therapy: Secondary | ICD-10-CM | POA: Insufficient documentation

## 2019-06-14 DIAGNOSIS — F17210 Nicotine dependence, cigarettes, uncomplicated: Secondary | ICD-10-CM | POA: Insufficient documentation

## 2019-06-14 DIAGNOSIS — I10 Essential (primary) hypertension: Secondary | ICD-10-CM | POA: Insufficient documentation

## 2019-06-14 DIAGNOSIS — C159 Malignant neoplasm of esophagus, unspecified: Secondary | ICD-10-CM | POA: Diagnosis not present

## 2019-06-14 DIAGNOSIS — R1013 Epigastric pain: Secondary | ICD-10-CM | POA: Diagnosis present

## 2019-06-14 DIAGNOSIS — E119 Type 2 diabetes mellitus without complications: Secondary | ICD-10-CM | POA: Diagnosis not present

## 2019-06-14 LAB — CBC WITH DIFFERENTIAL/PLATELET
Abs Immature Granulocytes: 0.03 10*3/uL (ref 0.00–0.07)
Basophils Absolute: 0 10*3/uL (ref 0.0–0.1)
Basophils Relative: 0 %
Eosinophils Absolute: 0 10*3/uL (ref 0.0–0.5)
Eosinophils Relative: 0 %
HCT: 34.1 % — ABNORMAL LOW (ref 39.0–52.0)
Hemoglobin: 11.1 g/dL — ABNORMAL LOW (ref 13.0–17.0)
Immature Granulocytes: 0 %
Lymphocytes Relative: 18 %
Lymphs Abs: 1.8 10*3/uL (ref 0.7–4.0)
MCH: 24.7 pg — ABNORMAL LOW (ref 26.0–34.0)
MCHC: 32.6 g/dL (ref 30.0–36.0)
MCV: 75.9 fL — ABNORMAL LOW (ref 80.0–100.0)
Monocytes Absolute: 0.6 10*3/uL (ref 0.1–1.0)
Monocytes Relative: 6 %
Neutro Abs: 7.4 10*3/uL (ref 1.7–7.7)
Neutrophils Relative %: 76 %
Platelets: 394 10*3/uL (ref 150–400)
RBC: 4.49 MIL/uL (ref 4.22–5.81)
RDW: 14.1 % (ref 11.5–15.5)
WBC: 9.9 10*3/uL (ref 4.0–10.5)
nRBC: 0 % (ref 0.0–0.2)

## 2019-06-14 MED ORDER — LORAZEPAM 2 MG/ML IJ SOLN
2.0000 mg | Freq: Once | INTRAMUSCULAR | Status: AC
  Administered 2019-06-14: 2 mg via INTRAVENOUS
  Filled 2019-06-14: qty 1

## 2019-06-14 MED ORDER — HYDROMORPHONE HCL 2 MG/ML IJ SOLN
2.0000 mg | Freq: Once | INTRAMUSCULAR | Status: AC
  Administered 2019-06-14: 2 mg via INTRAVENOUS
  Filled 2019-06-14: qty 1

## 2019-06-14 MED ORDER — SODIUM CHLORIDE 0.9 % IV BOLUS
1000.0000 mL | Freq: Once | INTRAVENOUS | Status: AC
  Administered 2019-06-14: 1000 mL via INTRAVENOUS

## 2019-06-14 MED ORDER — ONDANSETRON HCL 4 MG/2ML IJ SOLN
4.0000 mg | Freq: Once | INTRAMUSCULAR | Status: AC
  Administered 2019-06-14: 4 mg via INTRAVENOUS
  Filled 2019-06-14: qty 2

## 2019-06-14 NOTE — ED Notes (Signed)
As soon as pain medications were administered pt immediately got quiet, closed his eyes, and was still. Xray here to transport pt at this time.

## 2019-06-14 NOTE — ED Notes (Signed)
Pt yelling and rolling around in bed, getting up and down, walking around room, will not answer questions or listen wwhen  Nurse is trying to give instructions to sit down and be still so his port can be accessed, or we cannot administer IV pain medication. Pt continues to act in the way described above. Security called to bedside and [employer] to assist with pt cooperation in order for Dr Tomi Bamberger to evaluate pt. This nurse is able to access port with security and [employer]  Presence.

## 2019-06-14 NOTE — ED Triage Notes (Signed)
Pt c/o abd pain from his esophageal cancer. Last dose oxycodone at 1830

## 2019-06-15 LAB — COMPREHENSIVE METABOLIC PANEL
ALT: 12 U/L (ref 0–44)
AST: 16 U/L (ref 15–41)
Albumin: 3.5 g/dL (ref 3.5–5.0)
Alkaline Phosphatase: 61 U/L (ref 38–126)
Anion gap: 10 (ref 5–15)
BUN: 11 mg/dL (ref 6–20)
CO2: 22 mmol/L (ref 22–32)
Calcium: 9 mg/dL (ref 8.9–10.3)
Chloride: 103 mmol/L (ref 98–111)
Creatinine, Ser: 0.92 mg/dL (ref 0.61–1.24)
GFR calc Af Amer: >60 mL/min (ref >60)
GFR calc non Af Amer: >60 mL/min (ref >60)
Glucose, Bld: 163 mg/dL — ABNORMAL HIGH (ref 70–99)
Potassium: 3.9 mmol/L (ref 3.5–5.1)
Sodium: 135 mmol/L (ref 135–145)
Total Bilirubin: 0.7 mg/dL (ref 0.3–1.2)
Total Protein: 7.4 g/dL (ref 6.5–8.1)

## 2019-06-15 LAB — LIPASE, BLOOD: Lipase: 29 U/L (ref 11–51)

## 2019-06-15 MED ORDER — HEPARIN SOD (PORK) LOCK FLUSH 100 UNIT/ML IV SOLN
500.0000 [IU] | Freq: Once | INTRAVENOUS | Status: AC
  Administered 2019-06-15: 500 [IU]
  Filled 2019-06-15: qty 5

## 2019-06-15 NOTE — Discharge Instructions (Addendum)
Continue your pain medications that you have at home.  Please keep your appointments at [facility] for treatment of your esophageal cancer.

## 2019-06-15 NOTE — ED Provider Notes (Signed)
[facility] EMERGENCY DEPARTMENT Provider Note   CSN: [id number] Arrival date & time: 06/14/19  2107   Time seen 11:15 PM  History   Chief Complaint Chief Complaint  Patient presents with  . Abdominal Pain    HPI Maxwell Henderson is a 53 y.o. male.   Level 5 caveat due to urgent need for intervention.  HPI patient has a history of esophageal cancer followed at [facility].  It is in his distal esophagus and upper stomach.  He has a PEG feeding tube.  Patient has had multiple ED visits for intractable pain.  He reports he has been hurting all day today.  He states that he is taking his pain medicine without relief.  He states about 8 PM it got worse.  He is holding his upper abdomen.  Patient is very dramatic and rolling around on the floor, standing up bent over.  He denies nausea or vomiting.  He states his last bowel movement was yesterday.  Patient is unable to discuss his symptoms more than that.  PCP House, Deliah Goody, FNP   Past Medical History:  Diagnosis Date  . Diabetes mellitus without complication ([place])   . Hypertension   . 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 Abdominal pain 05/03/2019  . GERD (gastroesophageal reflux disease) 05/03/2019  . PEG (percutaneous endoscopic gastrostomy) status ([place]) 05/03/2019  . Intractable abdominal pain 02/19/2019  . Esophageal cancer ([place]) 02/03/2019  . Goals of care, counseling/discussion 11/28/2018  . Type 2 diabetes mellitus without complication, without long-term current use of insulin ([place])   . Essential hypertension   . SIRS (systemic inflammatory response syndrome) ([place]) 12/11/2017  . HCAP (healthcare-associated pneumonia) 12/11/2017  . Infection due to parainfluenza virus 3 12/11/2017  . PNA (pneumonia) 12/10/2017  . Hypokalemia 12/10/2017  . Iron deficiency anemia due to chronic  blood loss 02/28/2017  . 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 Dysphagia 01/05/2017    Past Surgical History:  Procedure Laterality Date  . ESOPHAGOGASTRODUODENOSCOPY (EGD) WITH PROPOFOL N/A 01/16/2017   Procedure: ESOPHAGOGASTRODUODENOSCOPY (EGD) WITH PROPOFOL;  Surgeon: [name], MD;  Location[facility] END[facility];  Service: Endoscopy;  Laterality: N/A;  730   . IR FLUORO GUIDE PORT INSERTION RIGHT  02/13/2017  . IR US GUIDE VASC ACCESS RIGHT  02/13/2017  . lipoma removal    . PORTA CATH INSERTION Right 02/13/2017  . SAVORY DILATION N/A 01/16/2017   Procedure: SAVORY DILATION;  Surgeon: [name], MD;  Location: [facility] ENDO SUITE;  Service: Endoscopy;  Laterality: N/A;        Home Medications    Prior to Admission medications   Medication Sig Start Date End Date Taking? Authorizing Provider  amLODipine (NORVASC) 10 MG tablet Take 1 tablet by mouth once daily Patient not taking: Reported on 06/03/2019 12/18/18   Lockamy, Randi L, NP-C  B Complex-C (B-COMPLEX WITH VITAMIN C) tablet Take 1 tablet by mouth daily. 09/28/17   Holley Bouche, NP  busPIRone (BUSPAR) 5 MG tablet Take 5 mg by mouth 3 (three) times daily. 05/22/19   [provider]  chlorproMAZINE (THORAZINE) 25 MG tablet Take 1 tablet (25 mg total) by mouth 3 (three) times daily. 02/17/19   Derek Jack, MD  dicyclomine (BENTYL) 10 MG capsule Take 10 mg by mouth 4 (four) times daily as needed for spasms.  05/22/19   [provider]  famotidine (PEPCID) 20 MG tablet Take 20 mg by mouth 2 (two) times daily. 05/22/19   [provider]  fentaNYL (DURAGESIC) 50 MCG/HR Place 1 patch onto the skin every 3 (three) days. 05/11/19   [provider]  gabapentin (NEURONTIN) 250 MG/5ML solution Take 2 mLs by mouth 3 (three) times daily. 05/22/19   [provider]  Hyoscyamine Sulfate SL (LEVSIN/SL) 0.125 MG SUBL Place 1 tablet under the tongue every 4 (four) hours as needed.  12/06/18   Derek Jack, MD  Lactulose 20 GM/30ML SOLN Take 30 mLs (20 g total) by mouth 2 (two) times a day. Patient not taking: Reported on 05/03/2019 02/17/19   Derek Jack, MD  loratadine (CLARITIN) 10 MG tablet Take 1 tablet by mouth daily. 01/30/19   [provider]  LORazepam (ATIVAN) 1 MG tablet Take 1 mg by mouth every 6 (six) hours as needed for anxiety.  05/22/19   [provider]  naloxegol oxalate (MOVANTIK) 25 MG TABS tablet Take 25 mg by mouth daily.  05/22/19   [provider]  Oxycodone HCl 20 MG TABS Take 1 tablet (20 mg total) by mouth every 4 (four) hours as needed. 03/03/19   Derek Jack, MD  pantoprazole (PROTONIX) 40 MG tablet Take 1 tablet (40 mg total) by mouth 2 (two) times daily. Take 30 minutes before breakfast 05/21/18   Derek Jack, MD  polyethylene glycol (MIRALAX / GLYCOLAX) 17 g packet Take 17 g by mouth daily.  05/22/19 05/21/20  [provider]  prochlorperazine (COMPAZINE) 10 MG tablet TAKE 1 TABLET BY MOUTH EVERY 6 HOURS AS NEEDED FOR NAUSEA AND VOMITING Patient not taking: No sig reported 01/14/19   Lockamy, Randi L, NP-C  senna-docusate (SENOKOT-S) 8.6-50 MG tablet Take 2 tablets by mouth daily as needed for mild constipation or moderate constipation.  05/22/19 05/21/20  [provider]  sorbitol 70 % SOLN Take 30 mLs by mouth See admin instructions. If no bowel movement in over 24hours, take 67ml by mouth every 2 hours as needed until bowel movement is achieved 05/23/19   [provider]  tamsulosin (FLOMAX) 0.4 MG CAPS capsule Take 1 capsule (0.4 mg total) by mouth at bedtime. 01/13/19   Derek Jack, MD  thiamine 100 MG tablet Take 1 tablet by mouth daily. 02/13/19   [provider]    Family History Family History  Problem Relation Age of Onset  . Prostate cancer Father   . Colon cancer Neg Hx   . Colon polyps Neg Hx     Social History Social History   Tobacco  Use  . Smoking status: Current Some Day Smoker    Packs/day: 0.25    Types: Cigarettes  . Smokeless tobacco: Never Used  Substance Use Topics  . Alcohol use: No    Comment: hx heavy alcohol, hasn't drank in 22 yrs  . Drug use: Yes    Types: Marijuana  lives with spouse   Allergies   Ketamine and related   Review of Systems Review of Systems  All other systems reviewed and are negative.    Physical Exam Updated Vital Signs BP 117/88   Pulse (!) 106   Resp 17   Ht 6\' 1"  (1.854 m)   Wt 73 kg   SpO2 100%   BMI 21.24 kg/m   Physical Exam Vitals signs and nursing note reviewed.  Constitutional:      General: He is in acute distress.  Comments: Dramatic, moaning and yelling out and can be heard all over the ED.  Patient is at times rolling around on the floor or bent over standing up.  He initially refuses to talk.  HENT:     Head: Normocephalic.     Right Ear: External ear normal.     Left Ear: External ear normal.     Mouth/Throat:     Comments: Refuses to open mouth Eyes:     General: No scleral icterus.    Extraocular Movements: Extraocular movements intact.     Conjunctiva/sclera: Conjunctivae normal.     Pupils: Pupils are equal, round, and reactive to light.     Comments: Conjunctiva looked pale  Neck:     Musculoskeletal: Normal range of motion.  Cardiovascular:     Rate and Rhythm: Regular rhythm. Tachycardia present.  Pulmonary:     Effort: Pulmonary effort is normal. No respiratory distress.  Abdominal:     Tenderness: There is abdominal tenderness.     Comments: Patient is hyperreactive even to having a stethoscope lightly placed on his abdomen.  He seems most painful in his upper abdomen, there is a lot of rigidity due to patient having muscle contractions of his abdomen.  Musculoskeletal: Normal range of motion.        General: No deformity.  Skin:    General: Skin is warm and dry.  Neurological:     General: No focal deficit present.      Mental Status: He is alert.  Psychiatric:        Mood and Affect: Affect is labile and angry.        Speech: He is noncommunicative. Speech is rapid and pressured.        Behavior: Behavior is uncooperative, agitated and withdrawn.      ED Treatments / Results  Labs (all labs ordered are listed, but only abnormal results are displayed) Results for orders placed or performed during the hospital encounter of 06/14/19  CBC with Differential  Result Value Ref Range   WBC 9.9 4.0 - 10.5 K/uL   RBC 4.49 4.22 - 5.81 MIL/uL   Hemoglobin 11.1 (L) 13.0 - 17.0 g/dL   HCT 34.1 (L) 39.0 - 52.0 %   MCV 75.9 (L) 80.0 - 100.0 fL   MCH 24.7 (L) 26.0 - 34.0 pg   MCHC 32.6 30.0 - 36.0 g/dL   RDW 14.1 11.5 - 15.5 %   Platelets 394 150 - 400 K/uL   nRBC 0.0 0.0 - 0.2 %   Neutrophils Relative % 76 %   Neutro Abs 7.4 1.7 - 7.7 K/uL   Lymphocytes Relative 18 %   Lymphs Abs 1.8 0.7 - 4.0 K/uL   Monocytes Relative 6 %   Monocytes Absolute 0.6 0.1 - 1.0 K/uL   Eosinophils Relative 0 %   Eosinophils Absolute 0.0 0.0 - 0.5 K/uL   Basophils Relative 0 %   Basophils Absolute 0.0 0.0 - 0.1 K/uL   Immature Granulocytes 0 %   Abs Immature Granulocytes 0.03 0.00 - 0.07 K/uL  Comprehensive metabolic panel  Result Value Ref Range   Sodium 135 135 - 145 mmol/L   Potassium 3.9 3.5 - 5.1 mmol/L   Chloride 103 98 - 111 mmol/L   CO2 22 22 - 32 mmol/L   Glucose, Bld 163 (H) 70 - 99 mg/dL   BUN 11 6 - 20 mg/dL   Creatinine, Ser 0.92 0.61 - 1.24 mg/dL   Calcium 9.0 8.9 - 10.3  mg/dL   Total Protein 7.4 6.5 - 8.1 g/dL   Albumin 3.5 3.5 - 5.0 g/dL   AST 16 15 - 41 U/L   ALT 12 0 - 44 U/L   Alkaline Phosphatase 61 38 - 126 U/L   Total Bilirubin 0.7 0.3 - 1.2 mg/dL   GFR calc non Af Amer >60 >60 mL/min   GFR calc Af Amer >60 >60 mL/min   Anion gap 10 5 - 15  Lipase, blood  Result Value Ref Range   Lipase 29 11 - 51 U/L   Laboratory interpretation all normal except hyperglycemia, stable mild anemia     EKG None  Radiology Dg Abdomen Acute W/chest  Result Date: 06/15/2019 CLINICAL DATA:  Abdominal pain, history esophageal cancer, diabetes mellitus, hypertension EXAM: DG ABDOMEN ACUTE W/ 1V CHEST COMPARISON:  Chest radiograph 06/07/2019, abdominal radiographs 05/10/2019 FINDINGS: RIGHT jugular Port-A-Cath with tip projecting over SVC. Normal heart size, mediastinal contours, and pulmonary vascularity. Lungs clear. No pleural effusion or pneumothorax. Air-filled loops of large and small bowel in the upper abdomen. Small amount gas in rectum. Overall bowel gas pattern is nonobstructive. No bowel dilatation, bowel wall thickening or free air. Osseous structures unremarkable. IMPRESSION: No acute abnormalities. Electronically Signed   By: Lavonia Dana M.D.   On: 06/15/2019 00:00    Procedures Procedures (including critical care time)  Medications Ordered in ED Medications  sodium chloride 0.9 % bolus 1,000 mL (0 mLs Intravenous Stopped 06/15/19 0100)  HYDROmorphone (DILAUDID) injection 2 mg (2 mg Intravenous Given 06/14/19 2335)  ondansetron (ZOFRAN) injection 4 mg (4 mg Intravenous Given 06/14/19 2335)  LORazepam (ATIVAN) injection 2 mg (2 mg Intravenous Given 06/14/19 2335)     Initial Impression / Assessment and Plan / ED Course  I have reviewed the triage vital signs and the nursing notes.  Pertinent labs & imaging results that were available during my care of the patient were reviewed by me and considered in my medical decision making (see chart for details).    Patient was given Dilaudid IV and Ativan.  Nurse reports afterwards patient was sleeping in no distress.   1:00 AM patient's resting peacefully in his stretcher.  He states his pain is gone.  We discussed his test results which were normal without acute changes.  He feels ready to be discharged.  Final Clinical Impressions(s) / ED Diagnoses   Final diagnoses:  Epigastric pain    ED Discharge Orders    None       Plan discharge  Rolland Porter, MD, Barbette Or, MD 06/15/19 [phone]

## 2019-09-24 ENCOUNTER — Emergency Department (HOSPITAL_COMMUNITY)
Admission: EM | Admit: 2019-09-24 | Discharge: 2019-09-24 | Payer: Medicaid Other | Attending: Emergency Medicine | Admitting: Emergency Medicine

## 2019-09-24 ENCOUNTER — Emergency Department (HOSPITAL_COMMUNITY): Payer: Medicaid Other

## 2019-09-24 ENCOUNTER — Encounter (HOSPITAL_COMMUNITY): Payer: Self-pay

## 2019-09-24 ENCOUNTER — Other Ambulatory Visit: Payer: Self-pay

## 2019-09-24 DIAGNOSIS — K922 Gastrointestinal hemorrhage, unspecified: Secondary | ICD-10-CM

## 2019-09-24 DIAGNOSIS — D62 Acute posthemorrhagic anemia: Secondary | ICD-10-CM | POA: Diagnosis not present

## 2019-09-24 DIAGNOSIS — I10 Essential (primary) hypertension: Secondary | ICD-10-CM | POA: Diagnosis not present

## 2019-09-24 DIAGNOSIS — D649 Anemia, unspecified: Secondary | ICD-10-CM | POA: Diagnosis not present

## 2019-09-24 DIAGNOSIS — E119 Type 2 diabetes mellitus without complications: Secondary | ICD-10-CM | POA: Insufficient documentation

## 2019-09-24 DIAGNOSIS — F17210 Nicotine dependence, cigarettes, uncomplicated: Secondary | ICD-10-CM | POA: Insufficient documentation

## 2019-09-24 DIAGNOSIS — R531 Weakness: Secondary | ICD-10-CM | POA: Diagnosis present

## 2019-09-24 DIAGNOSIS — R1013 Epigastric pain: Secondary | ICD-10-CM | POA: Insufficient documentation

## 2019-09-24 DIAGNOSIS — Z20822 Contact with and (suspected) exposure to covid-19: Secondary | ICD-10-CM | POA: Diagnosis not present

## 2019-09-24 LAB — CBC
HCT: 24.3 % — ABNORMAL LOW (ref 39.0–52.0)
HCT: 23.3 % — ABNORMAL LOW (ref 39.0–52.0)
Hemoglobin: 7.4 g/dL — ABNORMAL LOW (ref 13.0–17.0)
Hemoglobin: 7.1 g/dL — ABNORMAL LOW (ref 13.0–17.0)
MCH: 20.4 pg — ABNORMAL LOW (ref 26.0–34.0)
MCH: 20.7 pg — ABNORMAL LOW (ref 26.0–34.0)
MCHC: 30.5 g/dL (ref 30.0–36.0)
MCHC: 30.5 g/dL (ref 30.0–36.0)
MCV: 67.1 fL — ABNORMAL LOW (ref 80.0–100.0)
MCV: 67.9 fL — ABNORMAL LOW (ref 80.0–100.0)
Platelets: 279 10*3/uL (ref 150–400)
Platelets: 246 10*3/uL (ref 150–400)
RBC: 3.62 MIL/uL — ABNORMAL LOW (ref 4.22–5.81)
RBC: 3.43 MIL/uL — ABNORMAL LOW (ref 4.22–5.81)
RDW: 15.7 % — ABNORMAL HIGH (ref 11.5–15.5)
RDW: 15.9 % — ABNORMAL HIGH (ref 11.5–15.5)
WBC: 4.8 10*3/uL (ref 4.0–10.5)
WBC: 4.7 10*3/uL (ref 4.0–10.5)
nRBC: 0 % (ref 0.0–0.2)
nRBC: 0 % (ref 0.0–0.2)

## 2019-09-24 LAB — RESPIRATORY PANEL BY RT PCR (FLU A&B, COVID)
Influenza A by PCR: NEGATIVE
Influenza B by PCR: NEGATIVE
SARS Coronavirus 2 by RT PCR: NEGATIVE

## 2019-09-24 LAB — COMPREHENSIVE METABOLIC PANEL
ALT: 14 U/L (ref 0–44)
AST: 19 U/L (ref 15–41)
Albumin: 2.9 g/dL — ABNORMAL LOW (ref 3.5–5.0)
Alkaline Phosphatase: 55 U/L (ref 38–126)
Anion gap: 7 (ref 5–15)
BUN: 13 mg/dL (ref 6–20)
CO2: 28 mmol/L (ref 22–32)
Calcium: 8 mg/dL — ABNORMAL LOW (ref 8.9–10.3)
Chloride: 98 mmol/L (ref 98–111)
Creatinine, Ser: 0.68 mg/dL (ref 0.61–1.24)
GFR calc Af Amer: >60 mL/min (ref >60)
GFR calc non Af Amer: >60 mL/min (ref >60)
Glucose, Bld: 154 mg/dL — ABNORMAL HIGH (ref 70–99)
Potassium: 3.6 mmol/L (ref 3.5–5.1)
Sodium: 133 mmol/L — ABNORMAL LOW (ref 135–145)
Total Bilirubin: 0.3 mg/dL (ref 0.3–1.2)
Total Protein: 6.1 g/dL — ABNORMAL LOW (ref 6.5–8.1)

## 2019-09-24 LAB — PREPARE RBC (CROSSMATCH)

## 2019-09-24 LAB — LIPASE, BLOOD: Lipase: 28 U/L (ref 11–51)

## 2019-09-24 LAB — POC OCCULT BLOOD, ED: Fecal Occult Bld: POSITIVE — AB

## 2019-09-24 LAB — ABO/RH: ABO/RH(D): O POS

## 2019-09-24 MED ORDER — ONDANSETRON HCL 4 MG/2ML IJ SOLN
4.0000 mg | Freq: Once | INTRAMUSCULAR | Status: AC
  Administered 2019-09-24: 11:00:00 4 mg via INTRAVENOUS
  Filled 2019-09-24: qty 2

## 2019-09-24 MED ORDER — SODIUM CHLORIDE 0.9 % IV BOLUS
1000.0000 mL | Freq: Once | INTRAVENOUS | Status: AC
  Administered 2019-09-24: 1000 mL via INTRAVENOUS

## 2019-09-24 MED ORDER — SODIUM CHLORIDE 0.9 % IV BOLUS
1000.0000 mL | Freq: Once | INTRAVENOUS | Status: AC
  Administered 2019-09-24: 11:00:00 1000 mL via INTRAVENOUS

## 2019-09-24 MED ORDER — SODIUM CHLORIDE 0.9 % IV SOLN: 10.0000 mL/h | Freq: Once | INTRAVENOUS | Status: DC

## 2019-09-24 MED ORDER — FENTANYL CITRATE (PF) 100 MCG/2ML IJ SOLN
50.0000 ug | Freq: Once | INTRAMUSCULAR | Status: AC
  Administered 2019-09-24: 50 ug via INTRAVENOUS
  Filled 2019-09-24: qty 2

## 2019-09-24 MED ORDER — HYDROMORPHONE HCL 1 MG/ML IJ SOLN
1.0000 mg | Freq: Once | INTRAMUSCULAR | Status: AC
  Administered 2019-09-24: 1 mg via INTRAVENOUS
  Filled 2019-09-24: qty 1

## 2019-09-24 MED ORDER — PANTOPRAZOLE SODIUM 40 MG IV SOLR
40.0000 mg | Freq: Once | INTRAVENOUS | Status: AC
  Administered 2019-09-24: 12:00:00 40 mg via INTRAVENOUS
  Filled 2019-09-24: qty 40

## 2019-09-24 MED ORDER — FENTANYL CITRATE (PF) 100 MCG/2ML IJ SOLN
50.0000 ug | Freq: Once | INTRAMUSCULAR | Status: AC
  Administered 2019-09-24: 18:00:00 50 ug via INTRAVENOUS
  Filled 2019-09-24: qty 2

## 2019-09-24 NOTE — ED Notes (Signed)
Pt has bed at cancer center at [address] 7th floor RM 734  [phone]

## 2019-09-24 NOTE — ED Notes (Signed)
Report to Gerald Stabs, RN from [facility] transport

## 2019-09-24 NOTE — ED Triage Notes (Signed)
EMS reports pt c/o generalized weakness x 3days.  CBG 268 with ems.  Pt reports has esophageal cancer and is on immunotherapy.  Pt says has a peg tube and is unable to take anything by mouth.  C/O abd pain, rates at 3.EMs gave 587ml bolus NS pta for increased  Blood sugar.

## 2019-09-24 NOTE — Progress Notes (Signed)
Called by Dr. Sedonia Small for hospital admission in a patient with acute blood loss anemia with positive stool occult noted.  Hemoglobin noted to be 7.4 with baseline near 11.1 according to our records.  I had seen and evaluated the patient at bedside and he describes having blood in his gastrostomy tube earlier this morning as well as some mild hematemesis.  He states that he has chronic abdominal pain with inconsistent bowel movements despite taking lactulose.  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  He was actually seen by his oncologist on 2/9 for immunotherapy as well.  I had discussed the case with Dr. Laural Golden who states that the patient is too high risk for any procedures at [facility] and may require a covered wall stent which is not performed here.  Patient is currently in stable condition for transfer to [facility] and I have discussed this with EDP who will arrange for transfer.  Total care time: 35 minutes.

## 2019-09-24 NOTE — ED Provider Notes (Addendum)
[facility] Emergency Department Provider Note MRN:  [id number]  Arrival date & time: 09/24/19     Chief Complaint   Weakness   History of Present Illness   Maxwell Henderson is a 54 y.o. year-old male with a history of hypertension, diabetes, esophageal cancer presenting to the ED with chief complaint of weakness.  Patient has had generalized weakness for 3 days.  Having flare of chronic epigastric pain for several days.  Having nausea and vomiting.  Explains that he is sometimes having trouble swallowing his own saliva.  Denies fever, no cough, no chest pain or shortness of breath, no lower abdominal pain, no dysuria.  Symptoms moderate, constant, no exacerbating or alleviating factors.  Review of Systems  A complete 10 system review of systems was obtained and all systems are negative except as noted in the HPI and PMH.   Patient's Health History    Past Medical History:  Diagnosis Date  . Diabetes mellitus without complication ([place])   . Hypertension   . 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 ESOPHAGOGASTRODUODENOSCOPY (EGD) WITH PROPOFOL N/A 01/16/2017   Procedure: ESOPHAGOGASTRODUODENOSCOPY (EGD) WITH PROPOFOL;  Surgeon: [name], MD;  [facility];  Service: Endoscopy;  Laterality: N/A;  730   . IR FLUORO GUIDE PORT INSERTION RIGHT  02/13/2017  . IR US GUIDE VASC ACCESS RIGHT  02/13/2017  . lipoma removal    . PORTA CATH INSERTION Right 02/13/2017  . SAVORY DILATION N/A 01/16/2017   Procedure: SAVORY DILATION;  Surgeon: [name], MD;  Location[facility]O [facility];  Service: Endoscopy;  Laterality: N/A;    Family History  Problem Relation Age of Onset  . Prostate cancer Father   . Colon cancer Neg Hx   . Colon polyps Neg Hx     Social History   Socioeconomic History  . Marital status: Married    Spouse  name: Not on file  . Number of children: Not on file  . Years of education: Not on file  . Highest education level: Not on file  Occupational History  . Not on file  Tobacco Use  . Smoking status: Current Some Day Smoker    Packs/day: 0.25    Types: Cigarettes  . Smokeless tobacco: Never Used  Substance and Sexual Activity  . Alcohol use: No    Comment: hx heavy alcohol, hasn't drank in 22 yrs  . Drug use: Yes    Types: Marijuana  . Sexual activity: Yes  Other Topics Concern  . Not on file  Social History Narrative   WORKING [employer].   Social Determinants of Health   Financial Resource Strain: Unknown  . Difficulty of Paying Living Expenses: Patient refused  Food Insecurity: Unknown  . Worried About Charity fundraiser in the Last Year: Patient refused  . Ran Out of Food in the Last Year: Patient refused  Transportation Needs: Unknown  . Lack of Transportation (Medical): Patient refused  . Lack of Transportation (Non-Medical): Patient refused  Physical Activity: Unknown  . Days of Exercise per Week: Patient refused  . Minutes of Exercise per Session: Patient refused  Stress: Unknown  . Feeling of Stress : Patient refused  Social Connections: Unknown  . Frequency of Communication with Friends and Family: Patient refused  . Frequency of Social Gatherings with Friends and Family: Patient  refused  . Attends Religious Services: Patient refused  . Active Member of Clubs or Organizations: Patient refused  . Attends Archivist Meetings: Patient refused  . Marital Status: Patient refused  Intimate Partner Violence: Unknown  . Fear of Current or Ex-Partner: Patient refused  . Emotionally Abused: Patient refused  . Physically Abused: Patient refused  . Sexually Abused: Patient refused     Physical Exam   Vitals:   09/24/19 1100 09/24/19 1130  BP: (!) 84/57 114/79  Pulse: 74 65  Resp: 14 (!) 24  Temp:    SpO2: 100% 100%      CONSTITUTIONAL: Chronically ill-appearing, NAD NEURO:  Alert and oriented x 3, no focal deficits EYES:  eyes equal and reactive ENT/NECK:  no LAD, no JVD CARDIO: Regular rate, well-perfused, normal S1 and S2 PULM:  CTAB no wheezing or rhonchi GI/GU:  normal bowel sounds, non-distended, non-tender MSK/SPINE:  No gross deformities, no edema SKIN:  no rash, atraumatic PSYCH:  Appropriate speech and behavior  *Additional and/or pertinent findings included in MDM below  Diagnostic and Interventional Summary    EKG Interpretation  Date/Time:    Ventricular Rate:    PR Interval:    QRS Duration:   QT Interval:    QTC Calculation:   R Axis:     Text Interpretation:        Cardiac Monitoring Interpretation:  Labs Reviewed  CBC - Abnormal; Notable for the following components:      Result Value   RBC 3.62 (*)    Hemoglobin 7.4 (*)    HCT 24.3 (*)    MCV 67.1 (*)    MCH 20.4 (*)    RDW 15.7 (*)    All other components within normal limits  COMPREHENSIVE METABOLIC PANEL - Abnormal; Notable for the following components:   Sodium 133 (*)    Glucose, Bld 154 (*)    Calcium 8.0 (*)    Total Protein 6.1 (*)    Albumin 2.9 (*)    All other components within normal limits  RESPIRATORY PANEL BY RT PCR (FLU A&B, COVID)  LIPASE, BLOOD  TYPE AND SCREEN    DG ABD ACUTE 2+V W 1V CHEST  Final Result      Medications  pantoprazole (PROTONIX) injection 40 mg (has no administration in time range)  sodium chloride 0.9 % bolus 1,000 mL (1,000 mLs Intravenous New Bag/Given 09/24/19 1052)  sodium chloride 0.9 % bolus 1,000 mL (1,000 mLs Intravenous New Bag/Given 09/24/19 1052)  HYDROmorphone (DILAUDID) injection 1 mg (1 mg Intravenous Given 09/24/19 1053)  ondansetron (ZOFRAN) injection 4 mg (4 mg Intravenous Given 09/24/19 1052)     Procedures  /  Critical Care .Critical Care Performed by: Maudie Flakes, MD Authorized by: Maudie Flakes, MD   Critical care provider statement:     Critical care time (minutes):  80   Critical care was necessary to treat or prevent imminent or life-threatening deterioration of the following conditions: Symptomatic anemia requiring blood transfusion.   Critical care was time spent personally by me on the following activities:  Discussions with consultants, evaluation of patient's response to treatment, examination of patient, ordering and performing treatments and interventions, ordering and review of laboratory studies, ordering and review of radiographic studies, pulse oximetry, re-evaluation of patient's condition, obtaining history from patient or surrogate and review of old charts    ED Course and Medical Decision Making  I have reviewed the triage vital signs, the nursing notes, and pertinent  available records from the EMR.  Pertinent labs & imaging results that were available during my care of the patient were reviewed by me and considered in my medical decision making (see below for details).     Generalized weakness, nausea, vomiting, p.o. intolerance in the setting of known esophageal cancer, on immunotherapy.  Vital signs are normal, epigastrium is mildly tender but abdominal exam is largely soft.  Will provide symptomatic management, obtain screening labs to evaluate for any electrolyte disturbance.  Screening abdominal plain film to exclude obstruction.  12 PM update: Labs reveal anemia with hemoglobin of 7.4.  Significantly decreased from prior on record through [facility] health.  Care Everywhere has more recent data where patient hemoglobin ranges from 8-7.7 over the past week, but clearly downtrending from late January.  Concern for GI bleed.  Hemoccult stool is positive here.  Will admit to medicine service.  2 PM update: Hospital service here at [place] declined admission on this patient given his established medical care with [facility], recommending transfer.  [facility] hospital is without any beds, not accepting any transfers.  I  had multiple conversations with [place] specialist on-call as well as patient's personal oncologist, Dr. Jess Barters.  Patient is now endorsing episodes of hematemesis this morning as well as blood from his G-tube this morning.  Dr. Karolee Ohs of [place] has agreed that patient should be seen at [facility] via transfer, will be looking into the bed situation.  Until then, given patient's soft blood pressure and hemoglobin of 7.4, will transfuse 1 unit.  3:38 PM update: Spoke with Dr. [name] of [facility] hematology/oncology, who has formally accepted patient for transfer.  Barth Kirks. Sedonia Small, [place] [email or web address]  Final Clinical Impressions(s) / ED Diagnoses     ICD-10-CM   1. Symptomatic anemia  D64.9   2. Epigastric pain  R10.13 DG ABD ACUTE 2+V W 1V CHEST    DG ABD ACUTE 2+V W 1V CHEST  3. Gastrointestinal hemorrhage, unspecified gastrointestinal hemorrhage type  K92.2     ED Discharge Orders    None       Discharge Instructions Discussed with and Provided to Patient:   Discharge Instructions   None       Maudie Flakes, MD 09/24/19 1202    Maudie Flakes, MD 09/24/19 1538

## 2019-09-24 NOTE — ED Notes (Signed)
Pt reports severe abdominal pain. EDP notified and aware of BP. Pt reports he has 100 mcg fentanyl patch on back. EDP aware

## 2019-09-26 LAB — BPAM RBC
Blood Product Expiration Date: 202103092359
Unit Type and Rh: 5100

## 2019-09-26 LAB — TYPE AND SCREEN
ABO/RH(D): O POS
Antibody Screen: NEGATIVE
Unit division: 0

## 2019-10-31 ENCOUNTER — Encounter (HOSPITAL_COMMUNITY): Payer: Self-pay | Admitting: Emergency Medicine

## 2019-10-31 ENCOUNTER — Other Ambulatory Visit: Payer: Self-pay

## 2019-10-31 ENCOUNTER — Emergency Department (HOSPITAL_COMMUNITY)
Admission: EM | Admit: 2019-10-31 | Discharge: 2019-10-31 | Disposition: A | Discharge status: Home / Self Care | Payer: Medicaid Other | Attending: Emergency Medicine | Admitting: Emergency Medicine

## 2019-10-31 DIAGNOSIS — Z452 Encounter for adjustment and management of vascular access device: Secondary | ICD-10-CM | POA: Diagnosis present

## 2019-10-31 DIAGNOSIS — I10 Essential (primary) hypertension: Secondary | ICD-10-CM | POA: Insufficient documentation

## 2019-10-31 DIAGNOSIS — Z79899 Other long term (current) drug therapy: Secondary | ICD-10-CM | POA: Diagnosis not present

## 2019-10-31 DIAGNOSIS — E119 Type 2 diabetes mellitus without complications: Secondary | ICD-10-CM | POA: Insufficient documentation

## 2019-10-31 DIAGNOSIS — F17210 Nicotine dependence, cigarettes, uncomplicated: Secondary | ICD-10-CM | POA: Diagnosis not present

## 2019-10-31 DIAGNOSIS — Z7984 Long term (current) use of oral hypoglycemic drugs: Secondary | ICD-10-CM | POA: Insufficient documentation

## 2019-10-31 MED ORDER — HEPARIN SOD (PORK) LOCK FLUSH 100 UNIT/ML IV SOLN
500.0000 [IU] | Freq: Once | INTRAVENOUS | Status: AC
  Administered 2019-10-31: 500 [IU]
  Filled 2019-10-31: qty 5

## 2019-10-31 NOTE — Discharge Instructions (Addendum)
Return to the emergency department for any new or worsening symptoms.

## 2019-10-31 NOTE — ED Triage Notes (Signed)
PT needs port de accessed, he left [facility] and they forgot to take it out.

## 2019-10-31 NOTE — ED Provider Notes (Signed)
[name] EMERGENCY DEPARTMENT Provider Note   CSN: [id number] Arrival date & time: 10/31/19  1312     History Chief Complaint  Patient presents with  . Vascular Access Problem    Maxwell Henderson is a 54 y.o. male who presents the emergency department to have his port deaccessed.  Patient was giving blood work and infusion through [facility].  This occurred yesterday.  He states that they forgot to the access his port and came to the emergency department for [place] to remove it.  He denies any pain, fever, chills or discharge from the site of access HPI     Past Medical History:  Diagnosis Date  . Diabetes mellitus without complication ([place])   . Hypertension   . 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 Abdominal pain 05/03/2019  . GERD (gastroesophageal reflux disease) 05/03/2019  . PEG (percutaneous endoscopic gastrostomy) status ([place]) 05/03/2019  . Intractable abdominal pain 02/19/2019  . Esophageal cancer ([place]) 02/03/2019  . Goals of care, counseling/discussion 11/28/2018  . Type 2 diabetes mellitus without complication, without long-term current use of insulin ([place])   . Essential hypertension   . SIRS (systemic inflammatory response syndrome) ([place]) 12/11/2017  . HCAP (healthcare-associated pneumonia) 12/11/2017  . Infection due to parainfluenza virus 3 12/11/2017  . PNA (pneumonia) 12/10/2017  . Hypokalemia 12/10/2017  . 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 Dysphagia 01/05/2017    Past Surgical History:  Procedure Laterality Date  . ESOPHAGOGASTRODUODENOSCOPY (EGD) WITH PROPOFOL N/A 01/16/2017   Procedure: ESOPHAGOGASTRODUODENOSCOPY (EGD) WITH PROPOFOL;  Surgeon: [name], MD;  [facility];  Service: Endoscopy;  Laterality: N/A;  730   . IR  FLUORO GUIDE PORT INSERTION RIGHT  02/13/2017  . IR US GUIDE VASC ACCESS RIGHT  02/13/2017  . lipoma removal    . PORTA CATH INSERTION Right 02/13/2017  . SAVORY DILATION N/A 01/16/2017   Procedure: SAVORY DILATION;  Surgeon: [name], MD;  Location: [facility] ENDO [facility];  Service: Endoscopy;  Laterality: N/A;       Family History  Problem Relation Age of Onset  . Prostate cancer Father   . Colon cancer Neg Hx   . Colon polyps Neg Hx     Social History   Tobacco Use  . Smoking status: Current Some Day Smoker    Packs/day: 0.25    Types: Cigarettes  . Smokeless tobacco: Never Used  Substance Use Topics  . Alcohol use: No    Comment: hx heavy alcohol, hasn't drank in 22 yrs  . Drug use: Yes    Types: Marijuana    Home Medications Prior to Admission medications   Medication Sig Start Date End Date Taking? Authorizing Provider  amLODipine (NORVASC) 10 MG tablet Take 1 tablet by mouth once daily Patient not taking: Reported on 06/03/2019 12/18/18   Lockamy, Randi L, NP-C  B Complex-C (B-COMPLEX WITH VITAMIN C) tablet Take 1 tablet by mouth daily. Patient not taking: Reported on 09/24/2019 09/28/17   Holley Bouche, NP  busPIRone (BUSPAR) 5 MG tablet Take 5 mg by mouth 3 (three) times daily. 05/22/19   [provider]  chlorproMAZINE (THORAZINE) 25 MG tablet Take 1 tablet (25 mg total) by mouth 3 (three) times daily. Patient not taking: Reported on 09/24/2019 02/17/19  Derek Jack, MD  dicyclomine (BENTYL) 10 MG capsule Take 10 mg by mouth 4 (four) times daily as needed for spasms.  05/22/19   [provider]  famotidine (PEPCID) 20 MG tablet Take 20 mg by mouth 2 (two) times daily. 05/22/19   [provider]  fentaNYL (DURAGESIC) 50 MCG/HR Place 1 patch onto the skin every other day.  05/11/19   [provider]  gabapentin (NEURONTIN) 250 MG/5ML solution Take 2 mLs by mouth 3 (three) times daily. 05/22/19   [provider]  Hyoscyamine  Sulfate SL (LEVSIN/SL) 0.125 MG SUBL Place 1 tablet under the tongue every 4 (four) hours as needed. Patient not taking: Reported on 09/24/2019 12/06/18   Derek Jack, MD  Lactulose 20 GM/30ML SOLN Take 30 mLs (20 g total) by mouth 2 (two) times a day. 02/17/19   Derek Jack, MD  loratadine (CLARITIN) 10 MG tablet Take 1 tablet by mouth daily. 01/30/19   [provider]  LORazepam (ATIVAN) 1 MG tablet Take 1 mg by mouth every 6 (six) hours as needed for anxiety.  05/22/19   [provider]  naloxegol oxalate (MOVANTIK) 25 MG TABS tablet Take 25 mg by mouth daily.  05/22/19   [provider]  Oxycodone HCl 20 MG TABS Take 1 tablet (20 mg total) by mouth every 4 (four) hours as needed. 03/03/19   Derek Jack, MD  pantoprazole (PROTONIX) 40 MG tablet Take 1 tablet (40 mg total) by mouth 2 (two) times daily. Take 30 minutes before breakfast Patient taking differently: Take 40 mg by mouth once a week. Every Tuesday 05/21/18   Derek Jack, MD  polyethylene glycol (MIRALAX / GLYCOLAX) 17 g packet Take 17 g by mouth daily.  05/22/19 05/21/20  [provider]  prochlorperazine (COMPAZINE) 10 MG tablet TAKE 1 TABLET BY MOUTH EVERY 6 HOURS AS NEEDED FOR NAUSEA AND VOMITING Patient not taking: No sig reported 01/14/19   Lockamy, Randi L, NP-C  senna-docusate (SENOKOT-S) 8.6-50 MG tablet Take 2 tablets by mouth daily as needed for mild constipation or moderate constipation.  05/22/19 05/21/20  [provider]  sorbitol 70 % SOLN Take 30 mLs by mouth See admin instructions. If no bowel movement in over 24hours, take 49ml by mouth every 2 hours as needed until bowel movement is achieved 05/23/19   [provider]  sucralfate (CARAFATE) 1 g tablet Take 1 g by mouth 4 (four) times daily.    [provider]  tamsulosin (FLOMAX) 0.4 MG CAPS capsule Take 1 capsule (0.4 mg total) by mouth at bedtime. 01/13/19   Derek Jack, MD    thiamine 100 MG tablet Take 1 tablet by mouth daily. 02/13/19   [provider]    Allergies    Ketamine and related  Review of Systems   Review of Systems  Constitutional: Negative for chills and fever.  Skin: Negative for rash and wound.    Physical Exam Updated Vital Signs BP 123/83 (BP Location: Right Arm)   Pulse 72   Temp 97.6 F (36.4 C) (Oral)   Resp 18   Ht 6\' 1"  (1.854 m)   Wt 63 kg   SpO2 100%   BMI 18.32 kg/m   Physical Exam Vitals and nursing note reviewed.  Constitutional:      General: He is not in acute distress.    Appearance: He is well-developed. He is ill-appearing. He is not diaphoretic.     Comments: Patient appears chronically ill and is severely  underweight.  HENT:     Head: Normocephalic and atraumatic.  Eyes:     General: No scleral icterus.    Conjunctiva/sclera: Conjunctivae normal.  Cardiovascular:     Rate and Rhythm: Normal rate and regular rhythm.     Heart sounds: Normal heart sounds.  Pulmonary:     Effort: Pulmonary effort is normal. No respiratory distress.     Breath sounds: Normal breath sounds.  Chest:    Abdominal:     Palpations: Abdomen is soft.     Tenderness: There is no abdominal tenderness.  Musculoskeletal:     Cervical back: Normal range of motion and neck supple.  Skin:    General: Skin is warm and dry.  Neurological:     Mental Status: He is alert.  Psychiatric:        Behavior: Behavior normal.     ED Results / Procedures / Treatments   Labs (all labs ordered are listed, but only abnormal results are displayed) Labs Reviewed - No data to display  EKG None  Radiology No results found.  Procedures Procedures (including critical care time)  Medications Ordered in ED Medications  heparin lock flush 100 unit/mL (500 Units Intracatheter Given 10/31/19 1338)    ED Course  I have reviewed the triage vital signs and the nursing notes.  Pertinent labs & imaging results that were available  during my care of the patient were reviewed by me and considered in my medical decision making (see chart for details).    MDM Rules/Calculators/A&P                      Patient came to the emergency department to have his port deaccessed.  This was done without complication.  No evidence of infection.  Patient appears otherwise appropriate for discharge may follow-up with his primary care physician and oncologist. Final Clinical Impression(s) / ED Diagnoses Final diagnoses:  Encounter for care related to vascular access port    Rx / DC Orders ED Discharge Orders    None       Margarita Mail, PA-C 10/31/19 1348    Fredia Sorrow, MD 11/01/19 [phone]

## 2019-11-16 ENCOUNTER — Emergency Department (HOSPITAL_COMMUNITY)
Admission: EM | Admit: 2019-11-16 | Discharge: 2019-11-16 | Disposition: A | Discharge status: Home / Self Care | Payer: Medicaid Other | Attending: Emergency Medicine | Admitting: Emergency Medicine

## 2019-11-16 ENCOUNTER — Other Ambulatory Visit: Payer: Self-pay

## 2019-11-16 ENCOUNTER — Emergency Department (HOSPITAL_COMMUNITY): Payer: Medicaid Other

## 2019-11-16 ENCOUNTER — Encounter (HOSPITAL_COMMUNITY): Payer: Self-pay | Admitting: *Deleted

## 2019-11-16 DIAGNOSIS — K5900 Constipation, unspecified: Secondary | ICD-10-CM | POA: Diagnosis present

## 2019-11-16 DIAGNOSIS — E119 Type 2 diabetes mellitus without complications: Secondary | ICD-10-CM | POA: Diagnosis not present

## 2019-11-16 DIAGNOSIS — Z79899 Other long term (current) drug therapy: Secondary | ICD-10-CM | POA: Insufficient documentation

## 2019-11-16 DIAGNOSIS — Z87891 Personal history of nicotine dependence: Secondary | ICD-10-CM | POA: Diagnosis not present

## 2019-11-16 DIAGNOSIS — K5903 Drug induced constipation: Secondary | ICD-10-CM

## 2019-11-16 DIAGNOSIS — F1210 Cannabis abuse, uncomplicated: Secondary | ICD-10-CM | POA: Insufficient documentation

## 2019-11-16 DIAGNOSIS — I10 Essential (primary) hypertension: Secondary | ICD-10-CM | POA: Diagnosis not present

## 2019-11-16 MED ORDER — BISACODYL 10 MG RE SUPP
10.0000 mg | Freq: Once | RECTAL | Status: AC
  Administered 2019-11-16: 10 mg via RECTAL
  Filled 2019-11-16: qty 1

## 2019-11-16 NOTE — ED Provider Notes (Signed)
[facility] EMERGENCY DEPARTMENT Provider Note   CSN: [id number] Arrival date & time: 11/16/19  0349   Time seen 4:40 AM  History Chief Complaint  Patient presents with  . Constipation    Maxwell Henderson is a 54 y.o. male.  HPI   Patient states he is on chronic pain medication.  He states he takes stuff for constipation but he cannot tell me what he takes.  He states his last bowel movement was March 29 or 30 or the 31st.  He states his last bowel movement was hard and a small amount.  He states he feels like he has a knot in his rectal area.  He states he is having pain in his lower abdomen.  He denies nausea or vomiting.  PCP House, Deliah Goody, FNP   Past Medical History:  Diagnosis Date  . Diabetes mellitus without complication ([place])   . Hypertension   . 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 Abdominal pain 05/03/2019  . GERD (gastroesophageal reflux disease) 05/03/2019  . 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 Intractable abdominal pain 02/19/2019  . Esophageal cancer ([place]) 02/03/2019  . Goals of care, counseling/discussion 11/28/2018  . Type 2 diabetes mellitus without complication, without long-term current use of insulin ([place])   . Essential hypertension   . SIRS (systemic inflammatory response syndrome) ([place]) 12/11/2017  . HCAP (healthcare-associated pneumonia) 12/11/2017  . Infection due to parainfluenza virus 3 12/11/2017  . PNA (pneumonia) 12/10/2017  . Hypokalemia 12/10/2017  . 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 Dysphagia 01/05/2017    Past Surgical History:  Procedure Laterality Date  . ESOPHAGOGASTRODUODENOSCOPY (EGD) WITH PROPOFOL N/A 01/16/2017   Procedure: ESOPHAGOGASTRODUODENOSCOPY (EGD) WITH PROPOFOL;  Surgeon:  [name], MD;  Location[facility];  Service: Endoscopy;  Laterality: N/A;  730   . IR FLUORO GUIDE PORT INSERTION RIGHT  02/13/2017  . IR US GUIDE VASC ACCESS RIGHT  02/13/2017  . lipoma removal    . PORTA CATH INSERTION Right 02/13/2017  . SAVORY DILATION N/A 01/16/2017   Procedure: SAVORY DILATION;  Surgeon: [name], MD;  Location[facility];  Service: Endoscopy;  Laterality: N/A;       Family History  Problem Relation Age of Onset  . Prostate cancer Father   . Colon cancer Neg Hx   . Colon polyps Neg Hx     Social History   Tobacco Use  . Smoking status: Former Smoker    Packs/day: 0.25    Types: Cigarettes  . Smokeless tobacco: Never Used  Substance Use Topics  . Alcohol use: No    Comment: hx heavy alcohol, hasn't drank in 22 yrs  . Drug use: Yes    Types: Marijuana    Home Medications Prior to Admission medications   Medication Sig Start Date End Date Taking? Authorizing Provider  amLODipine (NORVASC) 10 MG tablet Take 1 tablet by mouth once daily Patient not taking: Reported on 06/03/2019 12/18/18   Lockamy, Randi L, NP-C  B Complex-C (B-COMPLEX WITH VITAMIN C) tablet Take 1 tablet by mouth daily. Patient not taking: Reported on 09/24/2019 09/28/17   Holley Bouche, NP  busPIRone (BUSPAR) 5 MG tablet Take 5 mg by mouth 3 (three) times daily. 05/22/19   [provider]  chlorproMAZINE (THORAZINE) 25 MG tablet Take 1 tablet (25 mg total) by mouth 3 (three) times daily. Patient not taking: Reported on 09/24/2019 02/17/19   Derek Jack, MD  dicyclomine (BENTYL) 10 MG capsule Take 10 mg by mouth 4 (four) times daily as needed for spasms.  05/22/19   [provider]  famotidine (PEPCID) 20 MG tablet Take 20 mg by mouth 2 (two) times daily. 05/22/19   [provider]  fentaNYL (DURAGESIC) 50 MCG/HR Place 1 patch onto the skin every other day.  05/11/19   [provider]  gabapentin (NEURONTIN) 250 MG/5ML solution  Take 2 mLs by mouth 3 (three) times daily. 05/22/19   [provider]  Hyoscyamine Sulfate SL (LEVSIN/SL) 0.125 MG SUBL Place 1 tablet under the tongue every 4 (four) hours as needed. Patient not taking: Reported on 09/24/2019 12/06/18   Derek Jack, MD  Lactulose 20 GM/30ML SOLN Take 30 mLs (20 g total) by mouth 2 (two) times a day. 02/17/19   Derek Jack, MD  loratadine (CLARITIN) 10 MG tablet Take 1 tablet by mouth daily. 01/30/19   [provider]  LORazepam (ATIVAN) 1 MG tablet Take 1 mg by mouth every 6 (six) hours as needed for anxiety.  05/22/19   [provider]  naloxegol oxalate (MOVANTIK) 25 MG TABS tablet Take 25 mg by mouth daily.  05/22/19   [provider]  Oxycodone HCl 20 MG TABS Take 1 tablet (20 mg total) by mouth every 4 (four) hours as needed. 03/03/19   Derek Jack, MD  pantoprazole (PROTONIX) 40 MG tablet Take 1 tablet (40 mg total) by mouth 2 (two) times daily. Take 30 minutes before breakfast Patient taking differently: Take 40 mg by mouth once a week. Every Tuesday 05/21/18   Derek Jack, MD  polyethylene glycol (MIRALAX / GLYCOLAX) 17 g packet Take 17 g by mouth daily.  05/22/19 05/21/20  [provider]  prochlorperazine (COMPAZINE) 10 MG tablet TAKE 1 TABLET BY MOUTH EVERY 6 HOURS AS NEEDED FOR NAUSEA AND VOMITING Patient not taking: No sig reported 01/14/19   Lockamy, Randi L, NP-C  senna-docusate (SENOKOT-S) 8.6-50 MG tablet Take 2 tablets by mouth daily as needed for mild constipation or moderate constipation.  05/22/19 05/21/20  [provider]  sorbitol 70 % SOLN Take 30 mLs by mouth See admin instructions. If no bowel movement in over 24hours, take 37ml by mouth every 2 hours as needed until bowel movement is achieved 05/23/19   [provider]  sucralfate (CARAFATE) 1 g tablet Take 1 g by mouth 4 (four) times daily.    [provider]  tamsulosin (FLOMAX) 0.4 MG CAPS capsule  Take 1 capsule (0.4 mg total) by mouth at bedtime. 01/13/19   Derek Jack, MD  thiamine 100 MG tablet Take 1 tablet by mouth daily. 02/13/19   [provider]    Allergies    Ketamine and related  Review of Systems   Review of Systems  All other systems reviewed and are negative.   Physical Exam Updated Vital Signs BP (!) 141/85   Pulse 85   Temp 98.4 F (36.9 C) (Oral)   Resp 20   Ht 6\' 1"  (1.854 m)   Wt 63 kg   SpO2 100%   BMI 18.32 kg/m   Physical Exam Vitals and nursing note reviewed.  Constitutional:      General: He is in acute distress.     Comments: Underweight male  HENT:  Head: Normocephalic and atraumatic.  Eyes:     Extraocular Movements: Extraocular movements intact.     Conjunctiva/sclera: Conjunctivae normal.     Pupils: Pupils are equal, round, and reactive to light.  Cardiovascular:     Rate and Rhythm: Normal rate.  Pulmonary:     Effort: Pulmonary effort is normal.  Musculoskeletal:        General: No deformity. Normal range of motion.     Cervical back: Normal range of motion.  Skin:    General: Skin is warm and dry.  Neurological:     General: No focal deficit present.     Mental Status: He is oriented to person, place, and time.     Cranial Nerves: No cranial nerve deficit.  Psychiatric:        Mood and Affect: Mood is anxious.        Behavior: Behavior normal.        Thought Content: Thought content normal.     ED Results / Procedures / Treatments   Labs (all labs ordered are listed, but only abnormal results are displayed) Labs Reviewed - No data to display  EKG None  Radiology DG Abd 2 Views  Result Date: 11/16/2019 CLINICAL DATA:  Per triage note: Pt c/o constipation, states that his last bowel movement was 3 days ago and "hard" EXAM: ABDOMEN - 2 VIEW COMPARISON:  09/24/2019 FINDINGS: Increased bowel gas within the colon. There is increased stool, particularly noted in the rectum, which is moderately  distended with stool. No findings of bowel obstruction. Gastrostomy tube left central abdomen is unchanged. No evidence of renal or ureteral stones.  Lung bases are clear. IMPRESSION: 1. Increased colonic stool with significant increase in rectal stool, findings consistent with constipation. No evidence of bowel obstruction. No acute finding. Electronically Signed   By: Lajean Manes M.D.   On: 11/16/2019 04:45    Procedures Procedures (including critical care time)  Medications Ordered in ED Medications  bisacodyl (DULCOLAX) suppository 10 mg (10 mg Rectal Given 11/16/19 0450)    ED Course  I have reviewed the triage vital signs and the nursing notes.  Pertinent labs & imaging results that were available during my care of the patient were reviewed by me and considered in my medical decision making (see chart for details).    MDM Rules/Calculators/A&P                      Patient was given a Dulcolax suppository after which he was able to have 3 very large bowel movements that patient described.  He feels much improved.  Wife states he is on sorbitol and lactulose for constipation preventative.  Final Clinical Impression(s) / ED Diagnoses Final diagnoses:  Drug-induced constipation    Rx / DC Orders ED Discharge Orders    None    OTC dulcolax prn  Plan discharge  Rolland Porter, MD, Barbette Or, MD 11/16/19 [phone]

## 2019-11-16 NOTE — ED Triage Notes (Signed)
Pt c/o constipation, states that his last bowel movement was 3 days ago and "hard"

## 2019-11-16 NOTE — ED Notes (Signed)
Patient states that he had a successful bowel movement and that he feels much better now.

## 2019-11-16 NOTE — Discharge Instructions (Addendum)
Try to drink plenty of fluids so your stools will be hard.  Continue your stool softeners and laxatives you are taking.  Tonight we gave you Dulcolax which helped you be able to go to the bathroom.  It is over-the-counter.  Recheck if you get worse again.

## 2020-08-16 ENCOUNTER — Other Ambulatory Visit: Payer: Medicaid Other

## 2020-10-17 IMAGING — CT CT ABD-PELV W/ CM
2 of 5 series · 16 of 46 positions shown, 18 images · IV contrast (omnipaque)
Comparison: 02/18/2019

CLINICAL DATA: Acute abdominal pain

EXAM:
CT ABDOMEN AND PELVIS WITH CONTRAST
TECHNIQUE: Multidetector CT imaging of the abdomen and pelvis was performed
using the standard protocol following bolus administration of
intravenous contrast.
CONTRAST:  100mL OMNIPAQUE IOHEXOL 300 MG/ML  SOLN

[Series 2: axial st · axial · 0.75mm/px · z∈[-319,+111]mm · 13 of 98 slices shown, 15 images]
[im 6/98  soft-tissue]
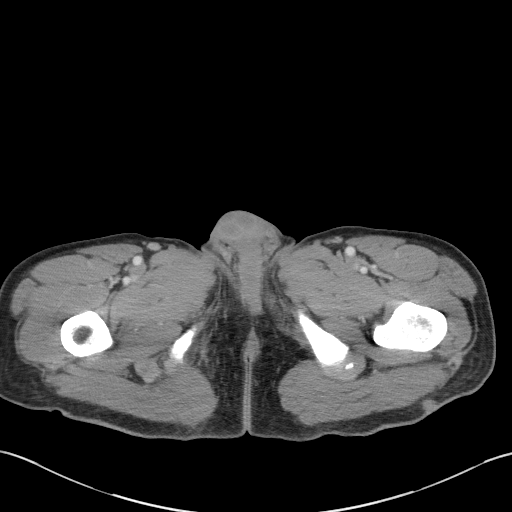
[im 6/98  bone]
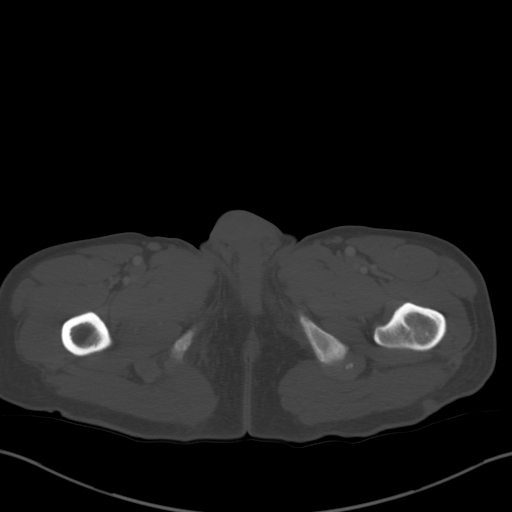
[im 12/98  soft-tissue]
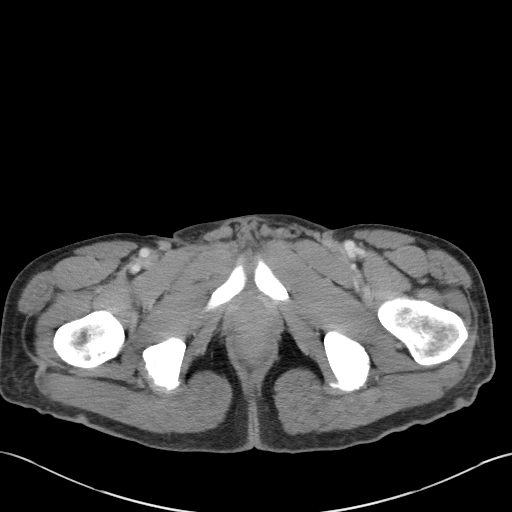
[im 23/98  soft-tissue]
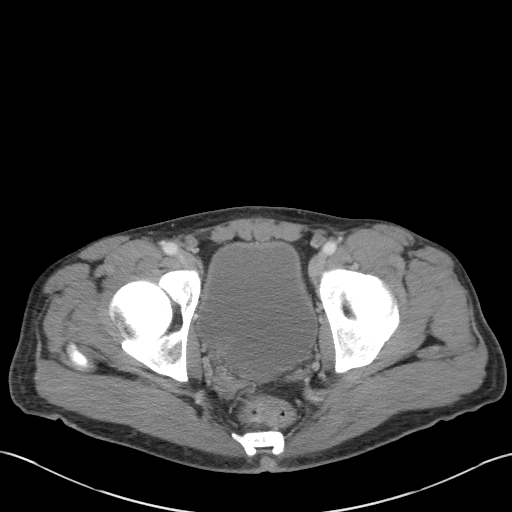
[im 29/98  soft-tissue]
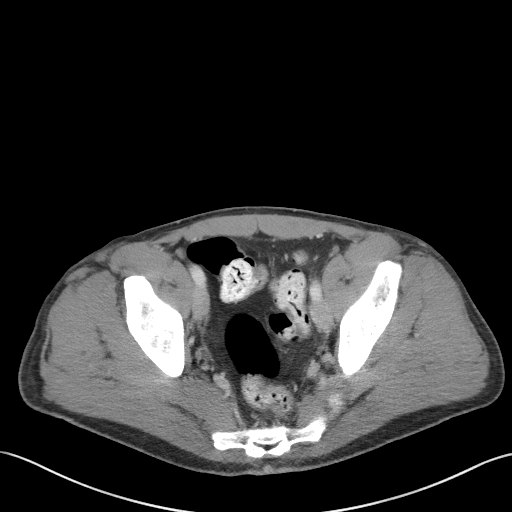
[im 35/98  soft-tissue]
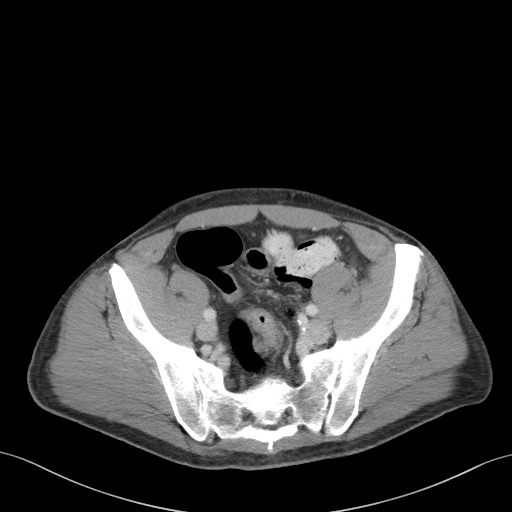
[im 40/98  soft-tissue]
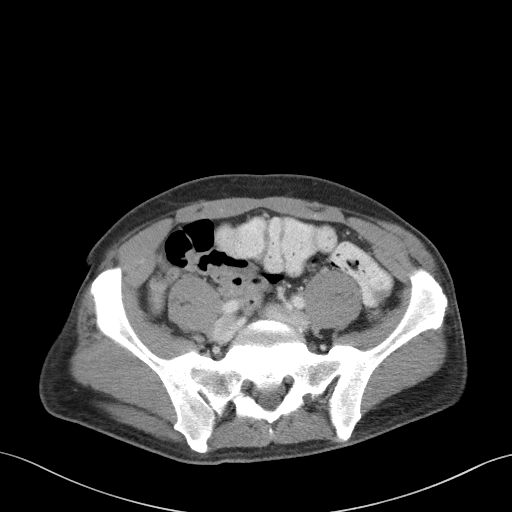
[im 52/98  soft-tissue]
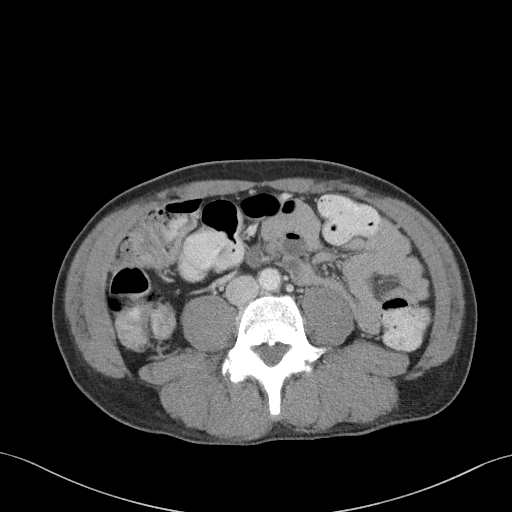
[im 58/98  soft-tissue]
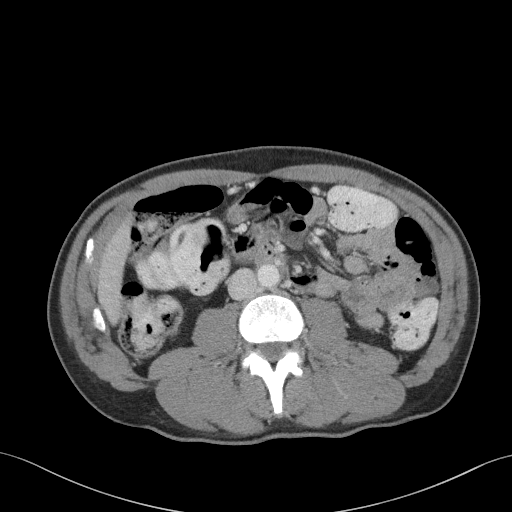
[im 63/98  soft-tissue]
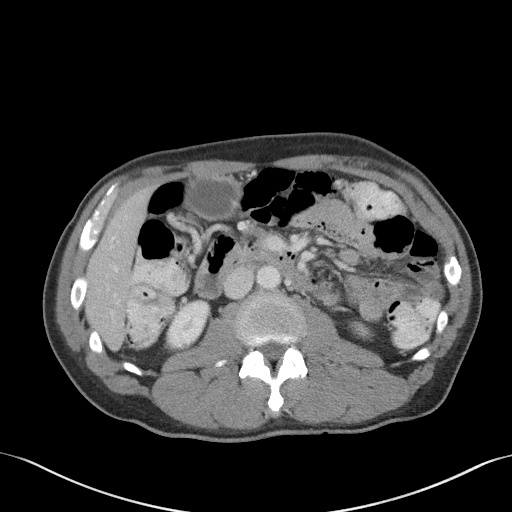
[im 63/98  bone]
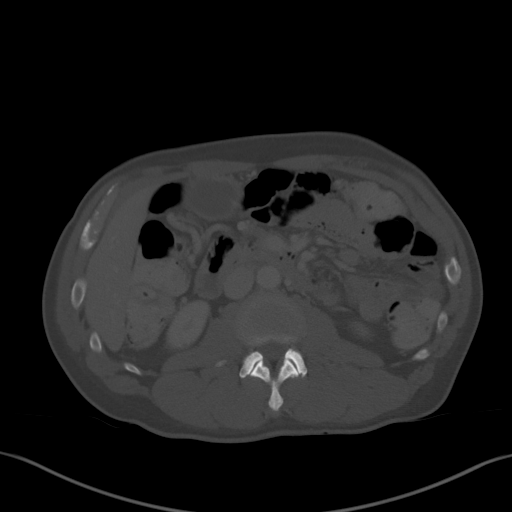
[im 69/98  soft-tissue]
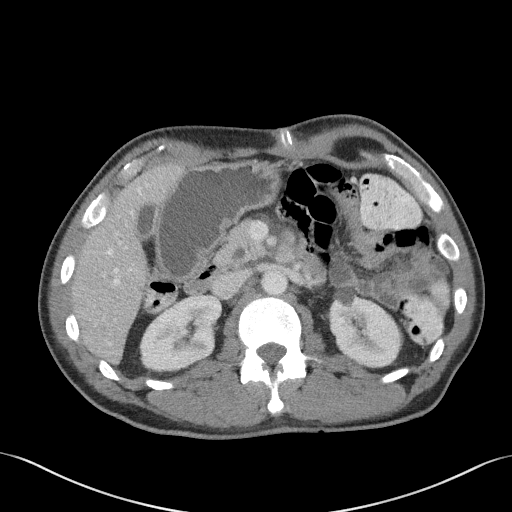
[im 75/98  soft-tissue]
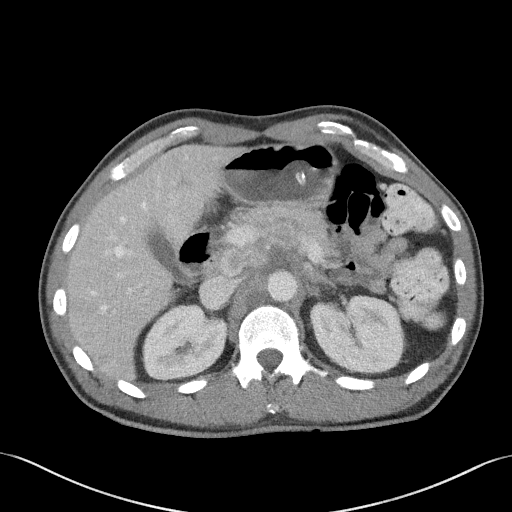
[im 86/98  soft-tissue]
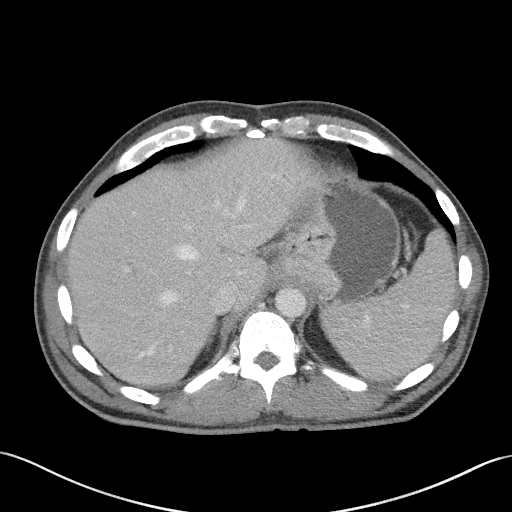
[im 92/98  soft-tissue]
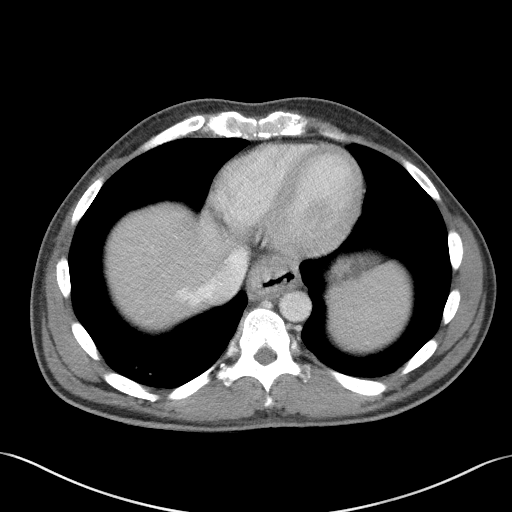

[Series 6: coronal st · coronal · 0.76mm/px · 3 of 99 slices shown]
[im 33/99  soft-tissue]
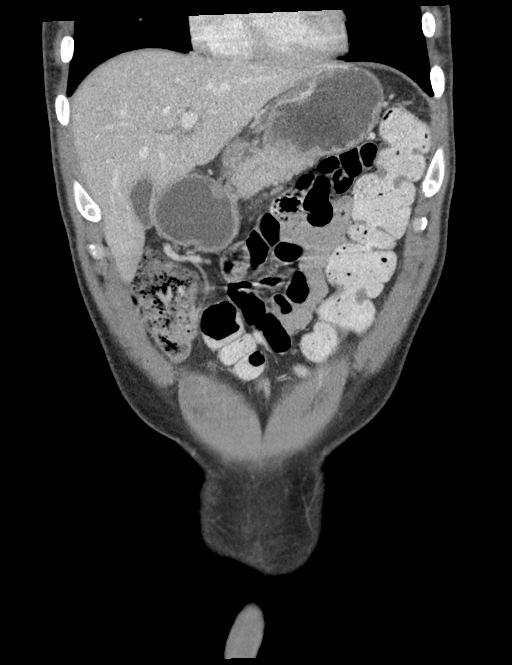
[im 44/99  soft-tissue]
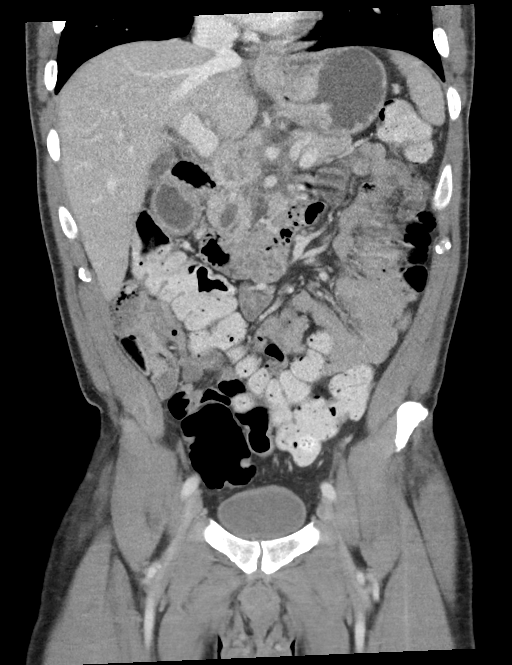
[im 55/99  soft-tissue]
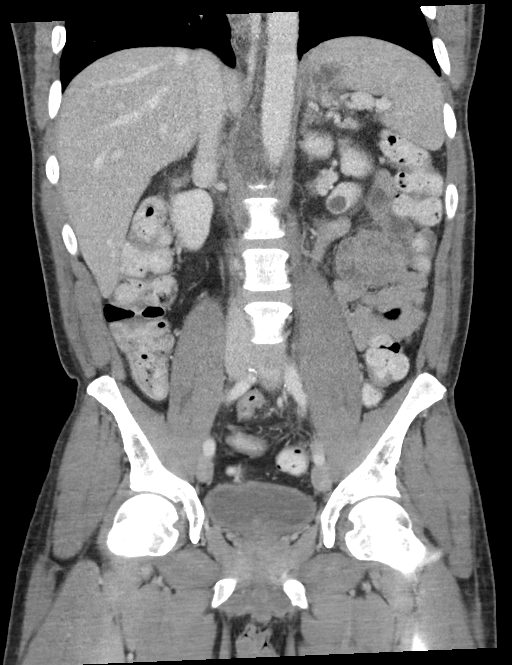

[16 of 46 positions shown; findings below may reference images not displayed]

FINDINGS: LOWER CHEST: There is no basilar pleural or apical pericardial
effusion.

HEPATOBILIARY: The hepatic contours and density are normal. The
distal common bile duct is mildly dilated. No intrahepatic biliary
dilatation. The gallbladder is normal.

PANCREAS: The pancreatic parenchymal contours are normal and there
is no ductal dilatation. There is no peripancreatic fluid
collection.

SPLEEN: Normal.

ADRENALS/URINARY TRACT:

--Adrenal glands: Normal.

--Right kidney/ureter: No hydronephrosis, nephroureterolithiasis,
perinephric stranding or solid renal mass.

--Left kidney/ureter: No hydronephrosis, nephroureterolithiasis,
perinephric stranding or solid renal mass.

--Urinary bladder: Normal for degree of distention

STOMACH/BOWEL:

--Stomach/Duodenum: Mild wall thickening of the distal esophagus.
There is a gastrostomy tube with tip in the stomach. Course of the
duodenum is normal.

--Small bowel: No dilatation or inflammation.

--Colon: No focal abnormality.

--Appendix: Normal.

VASCULAR/LYMPHATIC: Normal course and caliber of the major abdominal
vessels. No abdominal or pelvic lymphadenopathy.

REPRODUCTIVE: Normal prostate size with symmetric seminal vesicles.

MUSCULOSKELETAL. No bony spinal canal stenosis or focal osseous
abnormality.

OTHER: None.
IMPRESSION: 1. No acute abdominal or pelvic abnormality.
2. Mild wall thickening of the distal esophagus, likely secondary to
known/treated esophageal adenocarcinoma, similar to prior study.
3. Mildly dilated distal common bile duct.

## 2020-11-01 ENCOUNTER — Encounter (HOSPITAL_COMMUNITY)
Admission: EM | Disposition: A | Discharge status: Home / Self Care | Payer: Self-pay | Source: Home / Self Care | Attending: Emergency Medicine

## 2020-11-01 ENCOUNTER — Other Ambulatory Visit: Payer: Self-pay

## 2020-11-01 ENCOUNTER — Ambulatory Visit (HOSPITAL_COMMUNITY)
Admission: EM | Admit: 2020-11-01 | Discharge: 2020-11-01 | Disposition: A | Discharge status: Home / Self Care | Payer: Medicaid Other | Attending: Emergency Medicine | Admitting: Emergency Medicine

## 2020-11-01 ENCOUNTER — Encounter (HOSPITAL_COMMUNITY): Payer: Self-pay | Admitting: *Deleted

## 2020-11-01 ENCOUNTER — Emergency Department (HOSPITAL_COMMUNITY): Payer: Medicaid Other | Admitting: Anesthesiology

## 2020-11-01 DIAGNOSIS — Z79899 Other long term (current) drug therapy: Secondary | ICD-10-CM | POA: Diagnosis not present

## 2020-11-01 DIAGNOSIS — Z20822 Contact with and (suspected) exposure to covid-19: Secondary | ICD-10-CM | POA: Diagnosis not present

## 2020-11-01 DIAGNOSIS — K219 Gastro-esophageal reflux disease without esophagitis: Secondary | ICD-10-CM | POA: Insufficient documentation

## 2020-11-01 DIAGNOSIS — T18108A Unspecified foreign body in esophagus causing other injury, initial encounter: Secondary | ICD-10-CM | POA: Diagnosis not present

## 2020-11-01 DIAGNOSIS — D500 Iron deficiency anemia secondary to blood loss (chronic): Secondary | ICD-10-CM | POA: Diagnosis not present

## 2020-11-01 DIAGNOSIS — Z888 Allergy status to other drugs, medicaments and biological substances status: Secondary | ICD-10-CM | POA: Insufficient documentation

## 2020-11-01 DIAGNOSIS — I10 Essential (primary) hypertension: Secondary | ICD-10-CM | POA: Insufficient documentation

## 2020-11-01 DIAGNOSIS — X58XXXA Exposure to other specified factors, initial encounter: Secondary | ICD-10-CM | POA: Diagnosis not present

## 2020-11-01 DIAGNOSIS — E119 Type 2 diabetes mellitus without complications: Secondary | ICD-10-CM | POA: Diagnosis not present

## 2020-11-01 DIAGNOSIS — Z87891 Personal history of nicotine dependence: Secondary | ICD-10-CM | POA: Insufficient documentation

## 2020-11-01 DIAGNOSIS — R109 Unspecified abdominal pain: Secondary | ICD-10-CM | POA: Diagnosis not present

## 2020-11-01 DIAGNOSIS — Z8501 Personal history of malignant neoplasm of esophagus: Secondary | ICD-10-CM | POA: Diagnosis not present

## 2020-11-01 DIAGNOSIS — Z8042 Family history of malignant neoplasm of prostate: Secondary | ICD-10-CM | POA: Insufficient documentation

## 2020-11-01 DIAGNOSIS — K222 Esophageal obstruction: Secondary | ICD-10-CM

## 2020-11-01 HISTORY — PX: ESOPHAGOGASTRODUODENOSCOPY (EGD) WITH PROPOFOL: SHX5813

## 2020-11-01 LAB — CBC WITH DIFFERENTIAL/PLATELET
Abs Immature Granulocytes: 0.01 10*3/uL (ref 0.00–0.07)
Basophils Absolute: 0 10*3/uL (ref 0.0–0.1)
Basophils Relative: 1 %
Eosinophils Absolute: 0.1 10*3/uL (ref 0.0–0.5)
Eosinophils Relative: 2 %
HCT: 30.6 % — ABNORMAL LOW (ref 39.0–52.0)
Hemoglobin: 9.2 g/dL — ABNORMAL LOW (ref 13.0–17.0)
Immature Granulocytes: 0 %
Lymphocytes Relative: 33 %
Lymphs Abs: 1.4 10*3/uL (ref 0.7–4.0)
MCH: 18.3 pg — ABNORMAL LOW (ref 26.0–34.0)
MCHC: 30.1 g/dL (ref 30.0–36.0)
MCV: 60.7 fL — ABNORMAL LOW (ref 80.0–100.0)
Monocytes Absolute: 0.5 10*3/uL (ref 0.1–1.0)
Monocytes Relative: 11 %
Neutro Abs: 2.3 10*3/uL (ref 1.7–7.7)
Neutrophils Relative %: 53 %
Platelets: 259 10*3/uL (ref 150–400)
RBC: 5.04 MIL/uL (ref 4.22–5.81)
RDW: 19.1 % — ABNORMAL HIGH (ref 11.5–15.5)
WBC: 4.2 10*3/uL (ref 4.0–10.5)
nRBC: 0 % (ref 0.0–0.2)

## 2020-11-01 LAB — COMPREHENSIVE METABOLIC PANEL
ALT: 12 U/L (ref 0–44)
AST: 16 U/L (ref 15–41)
Albumin: 4.3 g/dL (ref 3.5–5.0)
Alkaline Phosphatase: 66 U/L (ref 38–126)
Anion gap: 8 (ref 5–15)
BUN: 12 mg/dL (ref 6–20)
CO2: 25 mmol/L (ref 22–32)
Calcium: 8.8 mg/dL — ABNORMAL LOW (ref 8.9–10.3)
Chloride: 106 mmol/L (ref 98–111)
Creatinine, Ser: 1.07 mg/dL (ref 0.61–1.24)
GFR, Estimated: >60 mL/min (ref >60)
Glucose, Bld: 102 mg/dL — ABNORMAL HIGH (ref 70–99)
Potassium: 3.5 mmol/L (ref 3.5–5.1)
Sodium: 139 mmol/L (ref 135–145)
Total Bilirubin: 0.7 mg/dL (ref 0.3–1.2)
Total Protein: 7.5 g/dL (ref 6.5–8.1)

## 2020-11-01 LAB — RESP PANEL BY RT-PCR (FLU A&B, COVID) ARPGX2
Influenza A by PCR: NEGATIVE
Influenza B by PCR: NEGATIVE
SARS Coronavirus 2 by RT PCR: NEGATIVE

## 2020-11-01 SURGERY — ESOPHAGOGASTRODUODENOSCOPY (EGD) WITH PROPOFOL
Anesthesia: General

## 2020-11-01 MED ORDER — PROPOFOL 10 MG/ML IV BOLUS
INTRAVENOUS | Status: DC | PRN
  Administered 2020-11-01: 200 mg via INTRAVENOUS

## 2020-11-01 MED ORDER — LACTATED RINGERS IV SOLN: INTRAVENOUS | Status: DC | PRN

## 2020-11-01 MED ORDER — SODIUM CHLORIDE 0.9 % IV SOLN: INTRAVENOUS | Status: DC

## 2020-11-01 MED ORDER — MIDAZOLAM HCL 5 MG/5ML IJ SOLN
INTRAMUSCULAR | Status: DC | PRN
  Administered 2020-11-01: 2 mg via INTRAVENOUS

## 2020-11-01 MED ORDER — SUCCINYLCHOLINE CHLORIDE 20 MG/ML IJ SOLN
INTRAMUSCULAR | Status: DC | PRN
  Administered 2020-11-01: 100 mg via INTRAVENOUS

## 2020-11-01 MED ORDER — GLUCAGON HCL RDNA (DIAGNOSTIC) 1 MG IJ SOLR
1.0000 mg | Freq: Once | INTRAMUSCULAR | Status: AC
  Administered 2020-11-01: 1 mg via INTRAVENOUS
  Filled 2020-11-01: qty 1

## 2020-11-01 MED ORDER — SODIUM CHLORIDE 0.9 % IV BOLUS (SEPSIS)
1000.0000 mL | Freq: Once | INTRAVENOUS | Status: AC
  Administered 2020-11-01: 1000 mL via INTRAVENOUS

## 2020-11-01 MED ORDER — MIDAZOLAM HCL 2 MG/2ML IJ SOLN
INTRAMUSCULAR | Status: AC
  Filled 2020-11-01: qty 6

## 2020-11-01 NOTE — Op Note (Signed)
[facility] Patient Name: Maxwell Henderson Procedure Date: 11/01/2020 9:45 PM MRN: [id number] Date of Birth: 1965/12/04 Attending MD: Norvel Richards , MD CSN: [id number] Age: 55 Admit Type: Outpatient Procedure:                Upper GI endoscopy Indications:              Foreign body in the esophagus Providers:                Norvel Richards, MD, Crystal Page, Bonnetta Barry, Technician Referring MD:              Medicines:                General Anesthesia Complications:            No immediate complications. Estimated Blood Loss:     Estimated blood loss was minimal. Procedure:                Pre-Anesthesia Assessment:                           - Prior to the procedure, a History and Physical                            was performed, and patient medications and                            allergies were reviewed. The patient's tolerance of                            previous anesthesia was also reviewed. The risks                            and benefits of the procedure and the sedation                            options and risks were discussed with the patient.                            All questions were answered, and informed consent                            was obtained. Prior Anticoagulants: The patient has                            taken no previous anticoagulant or antiplatelet                            agents. ASA Grade Assessment: III - A patient with                            severe systemic disease. After reviewing the risks  and benefits, the patient was deemed in                            satisfactory condition to undergo the procedure.                           After obtaining informed consent, the endoscope was                            passed under direct vision. Throughout the                            procedure, the patient's blood pressure, pulse, and                            oxygen  saturations were monitored continuously. The                            [id number]-[id number] ([id number]) scope was introduced through the                            mouth, and advanced to the fundus of the stomach.                            The upper GI endoscopy was accomplished without                            difficulty. The patient tolerated the procedure                            well. Scope In: 10:10:12 PM Scope Out: 10:14:01 PM Total Procedure Duration: 0 hours 3 minutes 49 seconds  Findings:      Meat bolus ball valving in the distal esophagus. 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 Approximated the tip of the gastroscope against the meat       bolus; I was able to push it through into the stomach fairly easily. It       actually broke up into 3 pieces with this maneuver and pushed 2       additional pieces through uneventfully. No attempt at completing EGD       this evening was undertaken per plan. Patient tolerated the brief       procedure well. Impression:               -Esophageal meat impaction - status post                            disimpaction as above. 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                            Incomplete examination. Moderate Sedation:      Moderate (conscious) sedation was personally administered by an       anesthesia professional. The following parameters were monitored: oxygen       saturation, heart rate, blood pressure, respiratory rate, EKG, adequacy       of pulmonary ventilation, and response to care. Recommendation:           - Patient has a contact number available for                            emergencies. The signs and symptoms of potential                            delayed complications were discussed with the                             patient. Return to normal activities tomorrow.                            Written discharge instructions were provided to the                            patient.                           - Full liquid diet.                           - Continue present medications.                           - Return to my office (date not yet determined). If                            he eats meat or bread he will likely to be impacted                            again. Recommended he see his oncologist to see if                            radiation therapy, etc. would be warranted in this                            setting. At patient request, I called [name],                            patient's wife, at [phone] and reviewed                            findings and recommendations Procedure Code(s):        --- Professional ---  43200, Esophagoscopy, flexible, transoral;                            diagnostic, including collection of specimen(s) by                            brushing or washing, when performed (separate                            procedure) Diagnosis Code(s):        --- Professional ---                           T18.108A, Unspecified foreign body in esophagus                            causing other injury, initial encounter CPT copyright 2019 American Medical Association. All rights reserved. The codes documented in this report are preliminary and upon coder review may  be revised to meet current compliance requirements. Cristopher Estimable. Brenya Taulbee, MD Norvel Richards, MD 11/01/2020 10:32:08 PM This report has been signed electronically. Number of Addenda: 0

## 2020-11-01 NOTE — ED Provider Notes (Signed)
[facility] EMERGENCY DEPARTMENT Provider Note   CSN: [id number] Arrival date & time: 11/01/20  1736     History Chief Complaint  Patient presents with  . Abdominal Pain  . esophageal problem    Maxwell Henderson is a 55 y.o. male.  Patient has a history of esophageal cancer for the last 4 years.  He was eating a hot dog around 6 PM yesterday and has not been able to swallow it.  He states that it still stuck in his esophagus  The history is provided by the patient and medical records. No language interpreter was used.  Gastroesophageal Reflux This is a new problem. The current episode started more than 2 days ago. The problem occurs constantly. The problem has not changed since onset.Pertinent negatives include no chest pain, no abdominal pain and no headaches. Nothing aggravates the symptoms. Nothing relieves the symptoms.       Past Medical History:  Diagnosis Date  . Diabetes mellitus without complication ([place])   . Hypertension   . 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 Abdominal pain 05/03/2019  . GERD (gastroesophageal reflux disease) 05/03/2019  . PEG (percutaneous endoscopic gastrostomy) status ([place]) 05/03/2019  . Intractable abdominal pain 02/19/2019  . Esophageal cancer ([place]) 02/03/2019  . Goals of care, counseling/discussion 11/28/2018  . Type 2 diabetes mellitus without complication, without long-term current use of insulin ([place])   . Essential hypertension   . SIRS (systemic inflammatory response syndrome) ([place]) 12/11/2017  . HCAP (healthcare-associated pneumonia) 12/11/2017  . Infection due to parainfluenza virus 3 12/11/2017  . PNA (pneumonia) 12/10/2017  . Hypokalemia 12/10/2017  . 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 Dysphagia 01/05/2017     Past Surgical History:  Procedure Laterality Date  . ESOPHAGOGASTRODUODENOSCOPY (EGD) WITH PROPOFOL N/A 01/16/2017   Procedure: ESOPHAGOGASTRODUODENOSCOPY (EGD) WITH PROPOFOL;  Surgeon: [name], MD;  Location: [facility];  Service: Endoscopy;  Laterality: N/A;  730   . IR FLUORO GUIDE PORT INSERTION RIGHT  02/13/2017  . IR US GUIDE VASC ACCESS RIGHT  02/13/2017  . lipoma removal    . PORTA CATH INSERTION Right 02/13/2017  . SAVORY DILATION N/A 01/16/2017   Procedure: SAVORY DILATION;  Surgeon: [name], MD;  Location: [facility]O [facility]ITE;  Service: Endoscopy;  Laterality: N/A;       Family History  Problem Relation Age of Onset  . Prostate cancer Father   . Colon cancer Neg Hx   . Colon polyps Neg Hx     Social History   Tobacco Use  . Smoking status: Former Smoker    Packs/day: 0.25    Types: Cigarettes  . Smokeless tobacco: Never Used  Vaping Use  . Vaping Use: Never used  Substance Use Topics  . Alcohol use: No    Comment: hx heavy alcohol, hasn't drank in 22 yrs  . Drug use: Yes    Types: Marijuana    Home Medications Prior to Admission medications   Medication Sig Start Date End Date Taking? Authorizing Provider  amLODipine (NORVASC) 10 MG tablet Take 1 tablet by mouth once daily Patient not taking: Reported on 06/03/2019 12/18/18   Lockamy, Randi L, NP-C  B Complex-C (B-COMPLEX WITH VITAMIN C) tablet Take 1 tablet by mouth daily. Patient not taking: Reported on 09/24/2019  09/28/17   Holley Bouche, NP  busPIRone (BUSPAR) 5 MG tablet Take 5 mg by mouth 3 (three) times daily. 05/22/19   [provider]  chlorproMAZINE (THORAZINE) 25 MG tablet Take 1 tablet (25 mg total) by mouth 3 (three) times daily. Patient not taking: Reported on 09/24/2019 02/17/19   Derek Jack, MD  dicyclomine (BENTYL) 10 MG capsule Take 10 mg by mouth 4 (four) times daily as needed for spasms.  05/22/19   [provider]  famotidine (PEPCID) 20 MG tablet Take  20 mg by mouth 2 (two) times daily. 05/22/19   [provider]  fentaNYL (DURAGESIC) 50 MCG/HR Place 1 patch onto the skin every other day.  05/11/19   [provider]  gabapentin (NEURONTIN) 250 MG/5ML solution Take 2 mLs by mouth 3 (three) times daily. 05/22/19   [provider]  Hyoscyamine Sulfate SL (LEVSIN/SL) 0.125 MG SUBL Place 1 tablet under the tongue every 4 (four) hours as needed. Patient not taking: Reported on 09/24/2019 12/06/18   Derek Jack, MD  Lactulose 20 GM/30ML SOLN Take 30 mLs (20 g total) by mouth 2 (two) times a day. 02/17/19   Derek Jack, MD  loratadine (CLARITIN) 10 MG tablet Take 1 tablet by mouth daily. 01/30/19   [provider]  LORazepam (ATIVAN) 1 MG tablet Take 1 mg by mouth every 6 (six) hours as needed for anxiety.  05/22/19   [provider]  naloxegol oxalate (MOVANTIK) 25 MG TABS tablet Take 25 mg by mouth daily.  05/22/19   [provider]  Oxycodone HCl 20 MG TABS Take 1 tablet (20 mg total) by mouth every 4 (four) hours as needed. 03/03/19   Derek Jack, MD  pantoprazole (PROTONIX) 40 MG tablet Take 1 tablet (40 mg total) by mouth 2 (two) times daily. Take 30 minutes before breakfast Patient taking differently: Take 40 mg by mouth once a week. Every Tuesday 05/21/18   Derek Jack, MD  prochlorperazine (COMPAZINE) 10 MG tablet TAKE 1 TABLET BY MOUTH EVERY 6 HOURS AS NEEDED FOR NAUSEA AND VOMITING Patient not taking: No sig reported 01/14/19   Lockamy, Randi L, NP-C  sorbitol 70 % SOLN Take 30 mLs by mouth See admin instructions. If no bowel movement in over 24hours, take 44ml by mouth every 2 hours as needed until bowel movement is achieved 05/23/19   [provider]  sucralfate (CARAFATE) 1 g tablet Take 1 g by mouth 4 (four) times daily.    [provider]  tamsulosin (FLOMAX) 0.4 MG CAPS capsule Take 1 capsule (0.4 mg total) by mouth at bedtime. 01/13/19    Derek Jack, MD  thiamine 100 MG tablet Take 1 tablet by mouth daily. 02/13/19   [provider]    Allergies    Ketamine and related  Review of Systems   Review of Systems  Constitutional: Negative for appetite change and fatigue.  HENT: Negative for congestion, ear discharge and sinus pressure.        Patient unable to swallow anything.  He cannot drink fluids  Eyes: Negative for discharge.  Respiratory: Negative for cough.   Cardiovascular: Negative for chest pain.  Gastrointestinal: Negative for abdominal pain and diarrhea.  Genitourinary: Negative for frequency and hematuria.  Musculoskeletal: Negative for back pain.  Skin: Negative for rash.  Neurological: Negative for seizures and headaches.  Psychiatric/Behavioral: Negative for hallucinations.    Physical Exam Updated Vital Signs BP 112/67   Pulse 71   Temp 98 F (  36.7 C) (Oral)   Resp 18   Ht 6\' 1"  (1.854 m)   Wt 94.8 kg   SpO2 98%   BMI 27.57 kg/m   Physical Exam Vitals and nursing note reviewed.  Constitutional:      Appearance: He is well-developed.  HENT:     Head: Normocephalic.  Eyes:     General: No scleral icterus.    Conjunctiva/sclera: Conjunctivae normal.  Neck:     Thyroid: No thyromegaly.  Cardiovascular:     Rate and Rhythm: Normal rate and regular rhythm.     Heart sounds: No murmur heard. No friction rub. No gallop.   Pulmonary:     Breath sounds: No stridor. No wheezing or rales.  Chest:     Chest wall: No tenderness.  Abdominal:     General: There is no distension.     Tenderness: There is no abdominal tenderness. There is no rebound.  Musculoskeletal:        General: Normal range of motion.     Cervical back: Neck supple.  Lymphadenopathy:     Cervical: No cervical adenopathy.  Skin:    Findings: No erythema or rash.  Neurological:     Mental Status: He is alert and oriented to person, place, and time.     Motor: No abnormal muscle tone.     Coordination:  Coordination normal.  Psychiatric:        Behavior: Behavior normal.     ED Results / Procedures / Treatments   Labs (all labs ordered are listed, but only abnormal results are displayed) Labs Reviewed  CBC WITH DIFFERENTIAL/PLATELET - Abnormal; Notable for the following components:      Result Value   Hemoglobin 9.2 (*)    HCT 30.6 (*)    MCV 60.7 (*)    MCH 18.3 (*)    RDW 19.1 (*)    All other components within normal limits  COMPREHENSIVE METABOLIC PANEL - Abnormal; Notable for the following components:   Glucose, Bld 102 (*)    Calcium 8.8 (*)    All other components within normal limits  RESP PANEL BY RT-PCR (FLU A&B, COVID) ARPGX2    EKG None  Radiology No results found.  Procedures Procedures   Medications Ordered in ED Medications  sodium chloride 0.9 % bolus 1,000 mL (1,000 mLs Intravenous New Bag/Given 11/01/20 1924)  glucagon (human recombinant) (GLUCAGEN) injection 1 mg (1 mg Intravenous Given 11/01/20 1923)    ED Course  I have reviewed the triage vital signs and the nursing notes.  Pertinent labs & imaging results that were available during my care of the patient were reviewed by me and considered in my medical decision making (see chart for details). Patient was given glucagon without any relief of the obstruction in his esophagus.   MDM Rules/Calculators/A&P                          Patient with meat impaction in esophagus.  He will be seen by GI and have endoscopy Final Clinical Impression(s) / ED Diagnoses Final diagnoses:  Esophageal obstruction    Rx / DC Orders ED Discharge Orders    None       Milton Ferguson, MD 11/03/20 [id number]

## 2020-11-01 NOTE — Anesthesia Preprocedure Evaluation (Addendum)
Anesthesia Evaluation  Patient identified by MRN, date of birth, ID band Patient awake    Reviewed: Allergy & Precautions, H&P , NPO status , Patient's Chart, lab work & pertinent test results, reviewed documented beta blocker date and time   Airway Mallampati: II  TM Distance: >3 FB Neck ROM: full    Dental no notable dental hx.    Pulmonary pneumonia, unresolved, former smoker,    Pulmonary exam normal breath sounds clear to auscultation       Cardiovascular Exercise Tolerance: Good hypertension, negative cardio ROS   Rhythm:regular Rate:Normal     Neuro/Psych negative neurological ROS  negative psych ROS   GI/Hepatic Neg liver ROS, GERD  Medicated,  Endo/Other  negative endocrine ROSdiabetes  Renal/GU negative Renal ROS  negative genitourinary   Musculoskeletal   Abdominal   Peds  Hematology  (+) Blood dyscrasia, anemia ,   Anesthesia Other Findings   Reproductive/Obstetrics negative OB ROS                             Anesthesia Physical Anesthesia Plan  ASA: III and emergent  Anesthesia Plan: General   Post-op Pain Management:    Induction:   PONV Risk Score and Plan: Propofol infusion  Airway Management Planned:   Additional Equipment:   Intra-op Plan:   Post-operative Plan:   Informed Consent: I have reviewed the patients History and Physical, chart, labs and discussed the procedure including the risks, benefits and alternatives for the proposed anesthesia with the patient or authorized representative who has indicated his/her understanding and acceptance.     Dental Advisory Given  Plan Discussed with: CRNA  Anesthesia Plan Comments:         Anesthesia Quick Evaluation

## 2020-11-01 NOTE — Transfer of Care (Signed)
Immediate Anesthesia Transfer of Care Note  Patient: TIANDRE TEALL  Procedure(s) Performed: ESOPHAGOGASTRODUODENOSCOPY (EGD) WITH PROPOFOL (N/A )  Patient Location: PACU  Anesthesia Type:General  Level of Consciousness: awake and drowsy  Airway & Oxygen Therapy: Patient Spontanous Breathing  Post-op Assessment: Report given to RN, Post -op Vital signs reviewed and stable and Patient moving all extremities  Post vital signs: Reviewed and stable  Last Vitals:  Vitals Value Taken Time  BP 137/98 11/01/20 2224  Temp    Pulse 97 11/01/20 2226  Resp 16 11/01/20 2226  SpO2 100 % 11/01/20 2226  Vitals shown include unvalidated device data.  Last Pain:  Vitals:   11/01/20 2201  TempSrc:   PainSc: 0-No pain      Patients Stated Pain Goal: 5 (59/93/57 0177)  Complications: No complications documented.

## 2020-11-01 NOTE — Anesthesia Procedure Notes (Signed)
Procedure Name: Intubation Date/Time: 11/01/2020 10:08 PM Performed by: Louann Sjogren, MD Pre-anesthesia Checklist: Patient identified, Patient being monitored, Timeout performed, Emergency Drugs available and Suction available Patient Re-evaluated:Patient Re-evaluated prior to induction Oxygen Delivery Method: Circle System Utilized Preoxygenation: Pre-oxygenation with 100% oxygen Induction Type: IV induction, Cricoid Pressure applied and Rapid sequence Ventilation: Mask ventilation without difficulty Laryngoscope Size: Mac and 3 Grade View: Grade III Tube type: Oral Tube size: 7.0 mm Number of attempts: 1 Airway Equipment and Method: Stylet Placement Confirmation: ETT inserted through vocal cords under direct vision,  positive ETCO2 and breath sounds checked- equal and bilateral Secured at: 21 cm Tube secured with: Tape Dental Injury: Teeth and Oropharynx as per pre-operative assessment

## 2020-11-01 NOTE — H&P (Signed)
@LOGO @   Primary Care Physician:  Edwinna Areola Deliah Goody, FNP Primary Gastroenterologist:  Dr. Gala Romney  Pre-Procedure History & Physical: HPI:  Maxwell Henderson is a 54 y.o. male here for urgent evaluation of possible/probable esophageal food impaction.  Patient swallowed a piece of hotdog about 6 PM yesterday and felt it lodged.  He came to the ED tonight saw Dr. Roderic Palau.  Glucagon did not work.  Dr. Roderic Palau called me.  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  Actively followed by oncology here and over at [facility].  Currently taking Keytruda to which he has responded according to the medical record.Marland Kitchen  He is status post radiation therapy last year as well. 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    Past Surgical History:  Procedure Laterality Date   ESOPHAGOGASTRODUODENOSCOPY (EGD) WITH PROPOFOL N/A 01/16/2017   Procedure: ESOPHAGOGASTRODUODENOSCOPY (EGD) WITH PROPOFOL;  Surgeon: [name], MD;  [facility];  Service: Endoscopy;  Laterality: N/A;  730    IR FLUORO GUIDE PORT INSERTION RIGHT  02/13/2017   IR US GUIDE VASC ACCESS RIGHT  02/13/2017   lipoma removal     PORTA CATH INSERTION Right 02/13/2017   SAVORY DILATION N/A 01/16/2017   Procedure: SAVORY DILATION;  Surgeon: [name], MD;  Location: [facility];  Service: Endoscopy;  Laterality: N/A;    Prior to Admission medications   Medication Sig Start Date End Date Taking? Authorizing Provider  dicyclomine (BENTYL) 10 MG capsule Take 10 mg by mouth 4 (four) times daily as needed for spasms.  05/22/19  Yes [provider]  famotidine (PEPCID) 20 MG tablet Take 20 mg by mouth 2 (two) times daily. 05/22/19  Yes [provider]  gabapentin (NEURONTIN) 250 MG/5ML solution Take 2 mLs by mouth 3  (three) times daily. 05/22/19  Yes [provider]  Lactulose 20 GM/30ML SOLN Take 30 mLs (20 g total) by mouth 2 (two) times a day. 02/17/19  Yes Derek Jack, MD  Oxycodone HCl 20 MG TABS Take 1 tablet (20 mg total) by mouth every 4 (four) hours as needed. Patient taking differently: Take 1 tablet by mouth every 4 (four) hours as needed (For pain). 03/03/19  Yes Derek Jack, MD  sucralfate (CARAFATE) 1 g tablet Take 1 g by mouth 4 (four) times daily as needed (For stomach).   Yes [provider]  thiamine 100 MG tablet Take 1 tablet by mouth daily. 02/13/19  Yes [provider]    Allergies as of 11/01/2020 - Review Complete 11/01/2020  Allergen Reaction Noted   Ketamine and related Other (See Comments) 05/10/2019    Family History  Problem Relation Age of Onset   Prostate cancer Father    Colon cancer Neg Hx    Colon polyps Neg Hx     Social History   Socioeconomic History   Marital status: Married    Spouse name: Not on file   Number of children: Not on file   Years of education: Not on file   Highest education level: Not on file  Occupational History   Not on file  Tobacco Use   Smoking status: Former Smoker    Packs/day: 0.25    Types: Cigarettes   Smokeless tobacco: Never Used  Vaping Use   Vaping Use:  Never used  Substance and Sexual Activity   Alcohol use: No    Comment: hx heavy alcohol, hasn't drank in 22 yrs   Drug use: Yes    Types: Marijuana   Sexual activity: Yes  Other Topics Concern   Not on file  Social History Narrative   WORKING [employer] AND [employer].   Social Determinants of Health   Financial Resource Strain: Not on file  Food Insecurity: Not on file  Transportation Needs: Not on file  Physical Activity: Not on file  Stress: Not on file  Social Connections: Not on file  Intimate Partner Violence: Not on file    Review of Systems: See HPI, otherwise negative ROS  Physical  Exam: BP 122/73    Pulse 67    Temp 98 F (36.7 C) (Oral)    Resp 13    Ht 6\' 1"  (1.854 m)    Wt 94.8 kg    SpO2 100%    BMI 27.57 kg/m  General:   Alert,  pleasant and cooperative in NAD Mouth:  No deformity or lesions. Neck:  Supple; no masses or thyromegaly. No significant cervical adenopathy. Lungs:  Clear throughout to auscultation.   No wheezes, crackles, or rhonchi. No acute distress. Heart:  Regular rate and rhythm; no murmurs, clicks, rubs,  or gallops. Abdomen: Non-distended, normal bowel sounds.  Soft and nontender without appreciable mass or hepatosplenomegaly.  Pulses:  Normal pulses noted. Extremities:  Without clubbing or edema.  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  Symptoms present now for over 24 hours.  I have offered the patient an urgent  EGD for food impaction removal.  He understands I will likely not attempt to complete examination nor dilate his esophagus in this setting given the high likelihood of retained food debris, inflammation and known esophageal neoplasm.  The risks, benefits, limitations, alternatives and imponderables have been reviewed with the patient. Potential for esophageal dilation, biopsy, etc. have also been reviewed.  Questions have been answered.  Patient is agreeable     Notice: This dictation was prepared with Dragon dictation along with smaller phrase technology. Any transcriptional errors that result from this process are unintentional and may not be corrected upon review.

## 2020-11-01 NOTE — Discharge Instructions (Addendum)
EGD Discharge instructions Please read the instructions outlined below and refer to this sheet in the next few weeks. These discharge instructions provide you with general information on caring for yourself after you leave the hospital. Your doctor may also give you specific instructions. While your treatment has been planned according to the most current medical practices available, unavoidable complications occasionally occur. If you have any problems or questions after discharge, please call your doctor. ACTIVITY  You may resume your regular activity but move at a slower pace for the next 24 hours.   Take frequent rest periods for the next 24 hours.   Walking will help expel (get rid of) the air and reduce the bloated feeling in your abdomen.   No driving for 24 hours (because of the anesthesia (medicine) used during the test).   You may shower.   Do not sign any important legal documents or operate any machinery for 24 hours (because of the anesthesia used during the test).  NUTRITION  Drink plenty of fluids.   You may resume your normal diet.   Begin with a light meal and progress to your normal diet.   Avoid alcoholic beverages for 24 hours or as instructed by your caregiver.  MEDICATIONS  You may resume your normal medications unless your caregiver tells you otherwise.  WHAT YOU CAN EXPECT TODAY  You may experience abdominal discomfort such as a feeling of fullness or "gas" pains.  FOLLOW-UP  Your doctor will discuss the results of your test with you.  SEEK IMMEDIATE MEDICAL ATTENTION IF ANY OF THE FOLLOWING OCCUR:  Excessive nausea (feeling sick to your stomach) and/or vomiting.   Severe abdominal pain and distention (swelling).   Trouble swallowing.   Temperature over 101 F (37.8 C).   Rectal bleeding or vomiting of blood.   Hotdog removed in your esophagus today.  The distal end of your esophagus is narrowed.  If you try to eat meat or bulky bread in the  future you will likely get obstructed again. Continue all of your home medications. Follow-up with your oncologist as planned Patient request, I spoke to [place] at [phone] regarding findings and recommendations.     Monitored Anesthesia Care, Care After This sheet gives you information about how to care for yourself after your procedure. Your health care provider may also give you more specific instructions. If you have problems or questions, contact your health care provider. What can I expect after the procedure? After the procedure, it is common to have:  Tiredness.  Forgetfulness about what happened after the procedure.  Impaired judgment for important decisions.  Nausea or vomiting.  Some difficulty with balance. Follow these instructions at home: For the time period you were told by your health care provider:  Rest as needed.  Do not participate in activities where you could fall or become injured.  Do not drive or use machinery.  Do not drink alcohol.  Do not take sleeping pills or medicines that cause drowsiness.  Do not make important decisions or sign legal documents.  Do not take care of children on your own.      Eating and drinking  Follow the diet that is recommended by your health care provider.  Drink enough fluid to keep your urine pale yellow.  If you vomit: ? Drink water, juice, or soup when you can drink without vomiting. ? Make sure you have little or no nausea before eating solid foods. General instructions  Have a responsible adult stay  with you for the time you are told. It is important to have someone help care for you until you are awake and alert.  Take over-the-counter and prescription medicines only as told by your health care provider.  If you have sleep apnea, surgery and certain medicines can increase your risk for breathing problems. Follow instructions from your health care provider about wearing your sleep device: ? Anytime  you are sleeping, including during daytime naps. ? While taking prescription pain medicines, sleeping medicines, or medicines that make you drowsy.  Avoid smoking.  Keep all follow-up visits as told by your health care provider. This is important. Contact a health care provider if:  You keep feeling nauseous or you keep vomiting.  You feel light-headed.  You are still sleepy or having trouble with balance after 24 hours.  You develop a rash.  You have a fever.  You have redness or swelling around the IV site. Get help right away if:  You have trouble breathing.  You have new-onset confusion at home. Summary  For several hours after your procedure, you may feel tired. You may also be forgetful and have poor judgment.  Have a responsible adult stay with you for the time you are told. It is important to have someone help care for you until you are awake and alert.  Rest as told. Do not drive or operate machinery. Do not drink alcohol or take sleeping pills.  Get help right away if you have trouble breathing, or if you suddenly become confused. This information is not intended to replace advice given to you by your health care provider. Make sure you discuss any questions you have with your health care provider. Document Revised: 04/15/2020 Document Reviewed: 07/03/2019 Elsevier Patient Education  2021 [place].    Full Liquid Diet A full liquid diet refers to fluids and foods that are liquid, or will become liquid, at room temperature. This diet should only be used for a short period of time to help you recover from illness or surgery. Your health care provider or dietitian will help determine when it is safe to eat regular foods again. What are tips for following this plan? Reading food labels  Check food labels of nutrition shakes for the amount of protein. Look for nutrition shakes that have at least 8-10 grams of protein in each serving.  Choose drinks, such as  milks and juices, that are "fortified" or "enriched." This means that vitamins and minerals have been added. Shopping  Buy pre-made nutrition shakes to keep on hand.  To vary your choices, buy different flavors of milks and shakes. Meal planning  Choose flavors and foods that you enjoy.  To make sure you get enough energy and calories from food: ? Have three full liquid meals each day. Have a liquid snack between each meal. ? Drink 6-8 oz (177-237 mL) of a nutritional supplement shake with meals or as snacks. ? Add protein powder, powdered milk, milk, or yogurt to shakes to increase the amount of protein.  Drink at least one serving a day of citrus fruit juice or fruit juice that has vitamin C added. General guidelines  Before starting the full liquid diet, check with your health care provider to know what foods you should avoid. These may include full-fat or high-fiber liquids.  You may have any liquid or food that becomes a liquid at room temperature. The food is considered a liquid if it can be poured off a spoon at room  temperature.  Do not drink alcohol unless approved by your health care provider.  This diet gives you most of the nutrients that you need for energy, but you may not get enough of certain vitamins, minerals, and fiber. Make sure to talk to your health care provider or dietitian about: ? How many calories you need to eat each day. ? How much fluid you should have each day. ? Taking a multivitamin or a nutritional supplement. What foods should I eat? Fruits Fruit juice without pulp. Strained fruit pures (seeds and skins removed). Vegetables Pulp-free tomato or vegetable juice. Vegetables pured in soup. Grains Thin, hot cereal, such as farina. Soft-cooked pasta or rice pured in soup. Meats and other proteins Beef, chicken, and fish broths. Powdered protein supplements. Dairy Milk and milk-based beverages, including milk shakes and instant breakfast mixes.  Smooth yogurt. Pured cottage cheese. Fats and oils Melted margarine and butter. Cream. Canola, almond, avocado, corn, grapeseed, sunflower, and sesame oils. Gravy. Beverages Water. Coffee and tea (caffeinated or decaffeinated). Cocoa. Liquid nutritional supplements. Soft drinks. Nondairy milks, such as almond, coconut, rice, or soy milk. Sweets and desserts Custard. Pudding. Flavored gelatin. Smooth ice cream (without nuts or candy pieces). Sherbet. Frozen ice pops. [place] ice. Pudding pops. Seasonings and condiments Salt and pepper. Spices. Vinegar. Ketchup. Yellow mustard. Smooth sauces, such as Hollandaise, cheese sauce, or white sauce. Soy sauce. Syrup. Honey. Jelly (without fruit pieces). Other foods Cocoa powder. Cream soups. Strained soups. The items listed above may not be a complete list of foods and beverages you can eat. Contact a dietitian for more information.   What foods should I avoid? Fruits All whole fresh, frozen, or canned fruits. Vegetables All whole fresh, frozen, or canned vegetables. Grains Whole grains. Pasta. Rice. Cold cereal. Bread. Crackers. Meats and other proteins All cuts of meat, poultry, and fish. Eggs. Tofu and soy protein. Nuts and nut butters. Precooked or cured meat, such as sausages or meat loaves. Dairy Hard cheese. Yogurt with fruit chunks. Fats and oils Coconut oil. Palm oil. Lard. Cold butter. Sweets and desserts Ice cream or other frozen desserts that contain solids, such as nuts, chocolate chips, and pieces of cookies. Cakes. Cookies. Candy. Seasonings and condiments Stone-ground mustard. Other foods Soups with chunks or pieces. The items listed above may not be a complete list of foods and beverages you should avoid. Contact a dietitian for more information. Summary  A full liquid diet refers to fluids and foods that are liquid or will become liquid at room temperature.  This diet should only be used for a short period of time to  help you recover from illness or surgery. Ask your health care provider or dietitian when it is safe for you to eat regular foods.  To make sure you get enough calories and nutrients, eat three meals each day with snacks in between. Drink pre-made nutritional supplement shakes or add protein powder to homemade shakes. Talk to your health care provider about taking a vitamin and mineral supplement. This information is not intended to replace advice given to you by your health care provider. Make sure you discuss any questions you have with your health care provider. Document Revised: 05/18/2020 Document Reviewed: 05/18/2020 Elsevier Patient Education  2021 [employer].

## 2020-11-01 NOTE — Anesthesia Postprocedure Evaluation (Signed)
Anesthesia Post Note  Patient: DANDRA VELARDI  Procedure(s) Performed: ESOPHAGOGASTRODUODENOSCOPY (EGD) WITH PROPOFOL (N/A )  Patient location during evaluation: PACU Anesthesia Type: General Level of consciousness: awake Pain management: pain level controlled Vital Signs Assessment: post-procedure vital signs reviewed and stable Respiratory status: spontaneous breathing and respiratory function stable Cardiovascular status: blood pressure returned to baseline and stable Postop Assessment: no headache and no apparent nausea or vomiting Anesthetic complications: no   No complications documented.   Last Vitals:  Vitals:   11/01/20 2115 11/01/20 2134  BP: 122/73 127/85  Pulse: 67 68  Resp: 13 15  Temp:  36.7 C  SpO2: 100% 100%    Last Pain:  Vitals:   11/01/20 2201  TempSrc:   PainSc: 0-No pain                 Louann Sjogren

## 2020-11-01 NOTE — ED Triage Notes (Signed)
States he has a piece of hot dog lodged in esophagus onset yesterday

## 2020-11-06 ENCOUNTER — Encounter (HOSPITAL_COMMUNITY): Payer: Self-pay | Admitting: Emergency Medicine

## 2020-11-06 ENCOUNTER — Other Ambulatory Visit: Payer: Self-pay

## 2020-11-06 ENCOUNTER — Emergency Department (HOSPITAL_COMMUNITY)
Admission: EM | Admit: 2020-11-06 | Discharge: 2020-11-06 | Disposition: A | Discharge status: Home / Self Care | Payer: Medicaid Other | Attending: Emergency Medicine | Admitting: Emergency Medicine

## 2020-11-06 DIAGNOSIS — T18128A Food in esophagus causing other injury, initial encounter: Secondary | ICD-10-CM | POA: Insufficient documentation

## 2020-11-06 DIAGNOSIS — Z8501 Personal history of malignant neoplasm of esophagus: Secondary | ICD-10-CM | POA: Insufficient documentation

## 2020-11-06 DIAGNOSIS — Z5321 Procedure and treatment not carried out due to patient leaving prior to being seen by health care provider: Secondary | ICD-10-CM | POA: Insufficient documentation

## 2020-11-06 DIAGNOSIS — X58XXXA Exposure to other specified factors, initial encounter: Secondary | ICD-10-CM | POA: Diagnosis not present

## 2020-11-06 NOTE — ED Triage Notes (Signed)
Pt to the ED with c/o pork stuck in his esophagus.  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 Pt is receiving radiation treatments and just had an EGD for the same on Monday.

## 2020-11-09 ENCOUNTER — Encounter (HOSPITAL_COMMUNITY): Payer: Self-pay | Admitting: Internal Medicine

## 2020-11-30 ENCOUNTER — Encounter (HOSPITAL_COMMUNITY): Payer: Self-pay | Admitting: Internal Medicine

## 2021-03-22 ENCOUNTER — Emergency Department (HOSPITAL_COMMUNITY)
Admission: EM | Admit: 2021-03-22 | Discharge: 2021-03-22 | Disposition: A | Discharge status: Home / Self Care | Payer: Medicaid Other | Attending: Emergency Medicine | Admitting: Emergency Medicine

## 2021-03-22 ENCOUNTER — Encounter (HOSPITAL_COMMUNITY): Payer: Self-pay | Admitting: *Deleted

## 2021-03-22 ENCOUNTER — Other Ambulatory Visit: Payer: Self-pay

## 2021-03-22 DIAGNOSIS — T18128A Food in esophagus causing other injury, initial encounter: Secondary | ICD-10-CM | POA: Insufficient documentation

## 2021-03-22 DIAGNOSIS — Z5321 Procedure and treatment not carried out due to patient leaving prior to being seen by health care provider: Secondary | ICD-10-CM | POA: Diagnosis not present

## 2021-03-22 DIAGNOSIS — X58XXXA Exposure to other specified factors, initial encounter: Secondary | ICD-10-CM | POA: Insufficient documentation

## 2021-03-22 NOTE — ED Triage Notes (Signed)
States he has food stuck in esophagus onset 45 minutes ago

## 2021-04-26 ENCOUNTER — Telehealth (HOSPITAL_COMMUNITY): Payer: Self-pay | Admitting: *Deleted

## 2021-04-26 NOTE — Telephone Encounter (Signed)
Received request from Mel Almond requesting that Waiver of premium physician form be filled out and faxed to [place].  Completed and will be scanned to chart.

## 2021-05-20 ENCOUNTER — Encounter (HOSPITAL_COMMUNITY): Payer: Self-pay | Admitting: Hematology

## 2021-07-16 ENCOUNTER — Encounter (HOSPITAL_COMMUNITY): Payer: Self-pay | Admitting: Family Medicine

## 2021-07-16 ENCOUNTER — Observation Stay (HOSPITAL_COMMUNITY)
Admission: EM | Admit: 2021-07-16 | Discharge: 2021-07-17 | Disposition: A | Discharge status: Home / Self Care | Payer: Medicaid Other | Attending: Internal Medicine | Admitting: Internal Medicine

## 2021-07-16 ENCOUNTER — Other Ambulatory Visit: Payer: Self-pay

## 2021-07-16 DIAGNOSIS — J101 Influenza due to other identified influenza virus with other respiratory manifestations: Principal | ICD-10-CM | POA: Insufficient documentation

## 2021-07-16 DIAGNOSIS — D500 Iron deficiency anemia secondary to blood loss (chronic): Secondary | ICD-10-CM | POA: Insufficient documentation

## 2021-07-16 DIAGNOSIS — I10 Essential (primary) hypertension: Secondary | ICD-10-CM | POA: Insufficient documentation

## 2021-07-16 DIAGNOSIS — Z20822 Contact with and (suspected) exposure to covid-19: Secondary | ICD-10-CM | POA: Diagnosis not present

## 2021-07-16 DIAGNOSIS — K219 Gastro-esophageal reflux disease without esophagitis: Secondary | ICD-10-CM | POA: Diagnosis not present

## 2021-07-16 DIAGNOSIS — K625 Hemorrhage of anus and rectum: Secondary | ICD-10-CM | POA: Diagnosis present

## 2021-07-16 DIAGNOSIS — E119 Type 2 diabetes mellitus without complications: Secondary | ICD-10-CM | POA: Insufficient documentation

## 2021-07-16 DIAGNOSIS — K922 Gastrointestinal hemorrhage, unspecified: Secondary | ICD-10-CM

## 2021-07-16 DIAGNOSIS — Z79899 Other long term (current) drug therapy: Secondary | ICD-10-CM | POA: Diagnosis not present

## 2021-07-16 DIAGNOSIS — Z87891 Personal history of nicotine dependence: Secondary | ICD-10-CM | POA: Diagnosis not present

## 2021-07-16 DIAGNOSIS — C159 Malignant neoplasm of esophagus, unspecified: Secondary | ICD-10-CM | POA: Insufficient documentation

## 2021-07-16 LAB — CBC WITH DIFFERENTIAL/PLATELET
Abs Immature Granulocytes: 0.05 10*3/uL (ref 0.00–0.07)
Basophils Absolute: 0 10*3/uL (ref 0.0–0.1)
Basophils Relative: 0 %
Eosinophils Absolute: 0 10*3/uL (ref 0.0–0.5)
Eosinophils Relative: 0 %
HCT: 25.3 % — ABNORMAL LOW (ref 39.0–52.0)
Hemoglobin: 7.3 g/dL — ABNORMAL LOW (ref 13.0–17.0)
Immature Granulocytes: 0 %
Lymphocytes Relative: 9 %
Lymphs Abs: 1 10*3/uL (ref 0.7–4.0)
MCH: 16.7 pg — ABNORMAL LOW (ref 26.0–34.0)
MCHC: 28.9 g/dL — ABNORMAL LOW (ref 30.0–36.0)
MCV: 57.9 fL — ABNORMAL LOW (ref 80.0–100.0)
Monocytes Absolute: 0.4 10*3/uL (ref 0.1–1.0)
Monocytes Relative: 3 %
Neutro Abs: 10.2 10*3/uL — ABNORMAL HIGH (ref 1.7–7.7)
Neutrophils Relative %: 88 %
Platelets: 288 10*3/uL (ref 150–400)
RBC: 4.37 MIL/uL (ref 4.22–5.81)
RDW: 19.2 % — ABNORMAL HIGH (ref 11.5–15.5)
WBC: 11.7 10*3/uL — ABNORMAL HIGH (ref 4.0–10.5)
nRBC: 0 % (ref 0.0–0.2)

## 2021-07-16 LAB — COMPREHENSIVE METABOLIC PANEL
ALT: 11 U/L (ref 0–44)
AST: 15 U/L (ref 15–41)
Albumin: 3.5 g/dL (ref 3.5–5.0)
Alkaline Phosphatase: 67 U/L (ref 38–126)
Anion gap: 8 (ref 5–15)
BUN: 15 mg/dL (ref 6–20)
CO2: 23 mmol/L (ref 22–32)
Calcium: 8.1 mg/dL — ABNORMAL LOW (ref 8.9–10.3)
Chloride: 105 mmol/L (ref 98–111)
Creatinine, Ser: 0.94 mg/dL (ref 0.61–1.24)
GFR, Estimated: >60 mL/min (ref >60)
Glucose, Bld: 119 mg/dL — ABNORMAL HIGH (ref 70–99)
Potassium: 4 mmol/L (ref 3.5–5.1)
Sodium: 136 mmol/L (ref 135–145)
Total Bilirubin: 0.4 mg/dL (ref 0.3–1.2)
Total Protein: 6.7 g/dL (ref 6.5–8.1)

## 2021-07-16 LAB — RESP PANEL BY RT-PCR (FLU A&B, COVID) ARPGX2
Influenza A by PCR: POSITIVE — AB
Influenza B by PCR: NEGATIVE
SARS Coronavirus 2 by RT PCR: NEGATIVE

## 2021-07-16 LAB — PREPARE RBC (CROSSMATCH)

## 2021-07-16 LAB — GLUCOSE, CAPILLARY: Glucose-Capillary: 119 mg/dL — ABNORMAL HIGH (ref 70–99)

## 2021-07-16 MED ORDER — LACTATED RINGERS IV SOLN: INTRAVENOUS | Status: DC

## 2021-07-16 MED ORDER — ONDANSETRON HCL 4 MG PO TABS: 4.0000 mg | ORAL_TABLET | Freq: Four times a day (QID) | ORAL | Status: DC | PRN

## 2021-07-16 MED ORDER — ONDANSETRON HCL 4 MG/2ML IJ SOLN
4.0000 mg | Freq: Four times a day (QID) | INTRAMUSCULAR | Status: DC | PRN
  Administered 2021-07-16: 4 mg via INTRAVENOUS
  Filled 2021-07-16: qty 2

## 2021-07-16 MED ORDER — OXYCODONE HCL 5 MG PO TABS
30.0000 mg | ORAL_TABLET | ORAL | Status: DC | PRN
  Administered 2021-07-16: 30 mg via ORAL
  Filled 2021-07-16: qty 6

## 2021-07-16 MED ORDER — SODIUM CHLORIDE 0.9 % IV SOLN: 10.0000 mL/h | Freq: Once | INTRAVENOUS | Status: DC

## 2021-07-16 MED ORDER — PANTOPRAZOLE SODIUM 40 MG IV SOLR
40.0000 mg | Freq: Two times a day (BID) | INTRAVENOUS | Status: DC
  Administered 2021-07-16 – 2021-07-17 (×2): 40 mg via INTRAVENOUS
  Filled 2021-07-16 (×3): qty 40

## 2021-07-16 MED ORDER — ACETAMINOPHEN 650 MG RE SUPP: 650.0000 mg | Freq: Four times a day (QID) | RECTAL | Status: DC | PRN

## 2021-07-16 MED ORDER — METHADONE HCL 10 MG PO TABS
25.0000 mg | ORAL_TABLET | Freq: Three times a day (TID) | ORAL | Status: DC
  Administered 2021-07-16 – 2021-07-17 (×2): 25 mg via ORAL
  Filled 2021-07-16 (×2): qty 3

## 2021-07-16 MED ORDER — LACTULOSE 10 GM/15ML PO SOLN
20.0000 g | Freq: Two times a day (BID) | ORAL | Status: DC
Start: 2021-07-16 — End: 2021-07-17
  Administered 2021-07-16 – 2021-07-17 (×2): 20 g via ORAL
  Filled 2021-07-16 (×2): qty 30

## 2021-07-16 MED ORDER — INSULIN ASPART 100 UNIT/ML IJ SOLN: 0.0000 [IU] | Freq: Every day | INTRAMUSCULAR | Status: DC

## 2021-07-16 MED ORDER — INSULIN ASPART 100 UNIT/ML IJ SOLN
0.0000 [IU] | Freq: Three times a day (TID) | INTRAMUSCULAR | Status: DC
  Administered 2021-07-17: 13:00:00 1 [IU] via SUBCUTANEOUS

## 2021-07-16 MED ORDER — ACETAMINOPHEN 325 MG PO TABS: 650.0000 mg | ORAL_TABLET | Freq: Four times a day (QID) | ORAL | Status: DC | PRN

## 2021-07-16 MED ORDER — METHADONE HCL 5 MG PO TABS: 2.5000 mg | ORAL_TABLET | Freq: Three times a day (TID) | ORAL | Status: DC

## 2021-07-16 MED ORDER — SUCRALFATE 1 G PO TABS
1.0000 g | ORAL_TABLET | Freq: Four times a day (QID) | ORAL | Status: DC | PRN
  Administered 2021-07-16: 1 g via ORAL
  Filled 2021-07-16: qty 1

## 2021-07-16 MED ORDER — SODIUM CHLORIDE 0.9 % IV BOLUS
1000.0000 mL | Freq: Once | INTRAVENOUS | Status: AC
  Administered 2021-07-16: 1000 mL via INTRAVENOUS

## 2021-07-16 MED ORDER — GABAPENTIN 300 MG PO CAPS
300.0000 mg | ORAL_CAPSULE | Freq: Three times a day (TID) | ORAL | Status: DC
  Administered 2021-07-16 – 2021-07-17 (×2): 300 mg via ORAL
  Filled 2021-07-16 (×2): qty 1

## 2021-07-16 NOTE — ED Provider Notes (Signed)
[facility] Provider Note   CSN: [id number] Arrival date & time: 07/16/21  1241     History No chief complaint on file.   Brownie Nehme is a 55 y.o. male with history of stage IV esophageal cancer who presents to the emergency department with an episode of rectal bleeding which occurred earlier this morning.  Patient states that he has been feeling under the weather over the last 4 to 5 days.  He states he drank 2 bottles of Coricidin red cough syrup and 2 bottles of Robitussin in the last 4 to 5 days for cough and general malaise. This morning, patient had an upset stomach had an episode of vomiting which was red and tasted sweet per the patient.  After vomiting he had some lower abdominal cramps followed by 1 episode of diarrhea which was black.  Still complaining of lower abdominal pain.  He denies any fever, chills, shortness of breath, chest pain, urinary complaints.  Patient is scheduled to have a repeat CT scan of the chest on 07/21/2021 followed by chemotherapy.  His regimen is every 8 weeks.  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   Intractable abdominal pain 02/19/2019   Esophageal cancer ([place]) 02/03/2019   Goals of care, counseling/discussion 11/28/2018   Type 2 diabetes mellitus without complication, without long-term current use of insulin ([place])    Essential hypertension    SIRS (systemic inflammatory response syndrome) ([place]) 12/11/2017   HCAP (healthcare-associated pneumonia) 12/11/2017   Infection due to parainfluenza virus 3 12/11/2017   PNA (pneumonia)  12/10/2017   Hypokalemia 12/10/2017   Iron deficiency anemia due to chronic blood loss 02/28/2017   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   Dysphagia 01/05/2017    Past Surgical History:  Procedure Laterality Date   ESOPHAGOGASTRODUODENOSCOPY (EGD) WITH PROPOFOL N/A 01/16/2017   Procedure: ESOPHAGOGASTRODUODENOSCOPY (EGD) WITH PROPOFOL;  Surgeon: [name], MD;  Location[facility];  Service: Endoscopy;  Laterality: N/A;  730    ESOPHAGOGASTRODUODENOSCOPY (EGD) WITH PROPOFOL N/A 11/01/2020   Procedure: ESOPHAGOGASTRODUODENOSCOPY (EGD) WITH PROPOFOL;  Surgeon: Daneil Dolin, MD;  Location: [facility];  Service: Endoscopy;  Laterality: N/A;   IR FLUORO GUIDE PORT INSERTION RIGHT  02/13/2017   IR US GUIDE VASC ACCESS RIGHT  02/13/2017   lipoma removal     PORTA CATH INSERTION Right 02/13/2017   SAVORY DILATION N/A 01/16/2017   Procedure: SAVORY DILATION;  Surgeon: [name], MD;  Location: [facility];  Service: Endoscopy;  Laterality: N/A;       Family History  Problem Relation Age of Onset   Prostate cancer Father    Colon cancer Neg Hx    Colon polyps Neg Hx     Social History   Tobacco Use   Smoking status: Former    Packs/day: 0.25    Types: Cigarettes   Smokeless tobacco: Never  Vaping Use   Vaping Use: Never used  Substance Use Topics   Alcohol use: No    Comment: hx heavy alcohol, hasn't drank in 22 yrs   Drug use: Yes  Types: Marijuana    Home Medications Prior to Admission medications   Medication Sig Start Date End Date Taking? Authorizing Provider  famotidine (PEPCID) 20 MG tablet Take 20 mg by mouth 2 (two) times daily. 05/22/19  Yes [provider]  gabapentin (NEURONTIN) 300 MG capsule Take 300 mg by mouth 3 (three) times daily. 06/25/21  Yes [provider]  Lactulose 20 GM/30ML SOLN Take 30 mLs (20 g total) by mouth 2 (two) times a day. 02/17/19  Yes Derek Jack, MD  methadone (DOLOPHINE) 10 MG  tablet Take by mouth See admin instructions. Take 2.5 tablets by mouth 3 times a day 07/15/21  Yes [provider]  oxycodone (ROXICODONE) 30 MG immediate release tablet Take 30 mg by mouth every 4 (four) hours as needed for pain. 07/15/21  Yes [provider]  pantoprazole (PROTONIX) 40 MG tablet Take 40 mg by mouth daily. 03/23/21  Yes [provider]  sucralfate (CARAFATE) 1 g tablet Take 1 g by mouth 4 (four) times daily as needed (For stomach).   Yes [provider]  dicyclomine (BENTYL) 10 MG capsule Take 10 mg by mouth 4 (four) times daily as needed for spasms.  Patient not taking: Reported on 07/16/2021 05/22/19   [provider]  gabapentin (NEURONTIN) 250 MG/5ML solution Take 2 mLs by mouth 3 (three) times daily. Patient not taking: Reported on 07/16/2021 05/22/19   [provider]  Oxycodone HCl 20 MG TABS Take 1 tablet (20 mg total) by mouth every 4 (four) hours as needed. Patient not taking: Reported on 07/16/2021 03/03/19   Derek Jack, MD  thiamine 100 MG tablet Take 1 tablet by mouth daily. Patient not taking: Reported on 07/16/2021 02/13/19   [provider]    Allergies    Ketamine and related  Review of Systems   Review of Systems  All other systems reviewed and are negative.  Physical Exam Updated Vital Signs BP 106/74   Pulse 94   Temp 97.7 F (36.5 C) (Oral)   Resp 14   Ht 6\' 1"  (1.854 m)   Wt 86.6 kg   SpO2 100%   BMI 25.20 kg/m   Physical Exam Vitals and nursing note reviewed.  Constitutional:      General: He is not in acute distress.    Appearance: Normal appearance.  HENT:     Head: Normocephalic and atraumatic.  Eyes:     General:        Right eye: No discharge.        Left eye: No discharge.  Cardiovascular:     Rate and Rhythm: Regular rhythm. Tachycardia present.     Comments: S1/S2 are distinct without any evidence of murmur, rubs, or gallops.  Radial pulses are 2+ bilaterally.   Dorsalis pedis pulses are 2+ bilaterally.  No evidence of pedal edema. Pulmonary:     Comments: Mild wheezing.  Normal effort.  No respiratory distress.   Abdominal:     General: Abdomen is flat. Bowel sounds are normal. There is no distension.     Tenderness: There is no abdominal tenderness. There is no guarding or rebound.  Musculoskeletal:        General: Normal range of motion.     Cervical back: Neck supple.  Skin:    General: Skin is warm and dry.     Findings: No rash.  Neurological:     General: No focal deficit present.     Mental Status: He is alert.  Psychiatric:        Mood and Affect: Mood normal.        Behavior: Behavior normal.    ED Results / Procedures / Treatments   Labs (all labs ordered are listed, but only abnormal results are displayed) Labs Reviewed  RESP PANEL BY RT-PCR (FLU A&B, COVID) ARPGX2 - Abnormal; Notable for the following components:      Result Value   Influenza A by PCR POSITIVE (*)    All other components within normal limits  COMPREHENSIVE METABOLIC PANEL - Abnormal; Notable for the following components:   Glucose, Bld 119 (*)    Calcium 8.1 (*)    All other components within normal limits  CBC WITH DIFFERENTIAL/PLATELET - Abnormal; Notable for the following components:   WBC 11.7 (*)    Hemoglobin 7.3 (*)    HCT 25.3 (*)    MCV 57.9 (*)    MCH 16.7 (*)    MCHC 28.9 (*)    RDW 19.2 (*)    Neutro Abs 10.2 (*)    All other components within normal limits  POC OCCULT BLOOD, ED  TYPE AND SCREEN  PREPARE RBC (CROSSMATCH)    EKG None  Radiology No results found.  Procedures .Critical Care Performed by: Hendricks Limes, PA-C Authorized by: Hendricks Limes, PA-C   Critical care provider statement:    Critical care time (minutes):  80   Critical care was necessary to treat or prevent imminent or life-threatening deterioration of the following conditions:  Circulatory failure   Critical care was time spent personally by me  on the following activities:  Ordering and performing treatments and interventions, ordering and review of laboratory studies, ordering and review of radiographic studies, re-evaluation of patient's condition, review of old charts and discussions with consultants   I assumed direction of critical care for this patient from another provider in my specialty: no     Care discussed with: admitting provider     Medications Ordered in ED Medications  pantoprazole (PROTONIX) injection 40 mg (has no administration in time range)  0.9 %  sodium chloride infusion (has no administration in time range)  sodium chloride 0.9 % bolus 1,000 mL (0 mLs Intravenous Stopped 07/16/21 1720)    ED Course  I have reviewed the triage vital signs and the nursing notes.  Pertinent labs & imaging results that were available during my care of the patient were reviewed by me and considered in my medical decision making (see chart for details).  Clinical Course as of 07/16/21 1823  Sat Jul 16, 2021  1609 Fecal occult blood was positive.  Gave him a liter of fluid and reevaluate. [CF]  1738 Spoke with Dr. Jenetta Downer with gastroenterology who agrees to consult on the patient. [CF]    Clinical Course User Index [CF] Hendricks Limes, PA-C   MDM Rules/Calculators/A&P                          Johnryan Sao is a 55 y.o. male who presents the emergency department for evaluation of 1 episode of red vomiting and 1 episode of black stools.  Given his history we will get some basic labs and respiratory panel to evaluate for COVID and flu.  I will also get a point-of-care occult blood to see if this was true gastrointestinal bleeding or from the medications he was taking secondary to his illness. Apart from being tachycardic patient is otherwise stable and  resting comfortably in the department.  Patient is otherwise resting comfortably in the emergency department and in no significant distress.  CBC showed leukocytosis and  acute on chronic anemia.  Hemoglobin was 7.3 which is a drop from 9.4 roughly 8 months ago.  Patient is symptomatic and will likely require blood transfusion.  CMP was unremarkable apart from some hyperglycemia and slightly lower calcium.  He was influenza positive. Likely contributing to why he feels poorly. Type and screen revealed he is O+.  Given the clinical scenario, this could be an active GI bleed somewhere in intestines or this could be a slow leak from his esophageal cancer.  I spoke with gastroenterology who agrees to consult on the patient.  Patient's tachycardia somewhat improved with a liter of fluid.  I ordered 2 units of packed red blood cells for his anemia.  Spoke with Dr. Nehemiah Settle who agrees to admit the patient to the hospital.  This chart was dictated using voice recognition software.  Despite best efforts to proofread,  errors can occur which can change the documentation meaning.    Final Clinical Impression(s) / ED Diagnoses Final diagnoses:  Gastrointestinal hemorrhage, unspecified gastrointestinal hemorrhage type    Rx / DC Orders ED Discharge Orders     None        Cherrie Gauze 07/16/21 Berenda Morale, MD 07/16/21 [phone]

## 2021-07-16 NOTE — H&P (Signed)
History and Physical  Maxwell Henderson WCB:[id number] DOB: Feb 10, 1966 DOA: 07/16/2021  Referring physician: Ranell Patrick, PA-C, EDP PCP: [name], FNP  Outpatient Specialists:   Patient Coming From: home  Chief Complaint: vomiting, black stool  HPI: Maxwell Henderson is a 55 y.o. male with a history of type 2 diabetes without complication, hypertension, chronic anemia, esophageal adenocarcinoma.  Patient has been feeling sick for the past week and has steadily been coming worse with cough and general malaise.  Over the past 2 to 3 days he has had 2 bottles of Robitussin and 2 bottles of cough syrup.  This morning, he began to have abdominal pain with nausea.  He tried to eat a biscuit but ended up vomiting red liquid, which tasted sweet.  A little later, the patient had an episode of black diarrhea.  He is continued to have some nonradiating central abdominal pain.  He has not had much to eat or drink today.  Emergency Department Course: Mildly tachycardic on arrival.  Afebrile.  Hemoglobin 1.3.  Hemoccult positive.  Influenza positive.  GI consulted who will see the patient in the morning.  Review of Systems:   Pt denies any fevers, chills, constipation, shortness of breath, dyspnea on exertion, orthopnea, cough, wheezing, palpitations, headache, vision changes, lightheadedness, dizziness, rectal bleeding.  Review of systems are otherwise negative  Past Medical History:  Diagnosis Date   Diabetes mellitus without complication ([place])    Hypertension    Iron deficiency anemia due to chronic blood loss 02/28/2017   Primary esophageal adenocarcinoma ([place]) 01/29/2017   Past Surgical History:  Procedure Laterality Date   ESOPHAGOGASTRODUODENOSCOPY (EGD) WITH PROPOFOL N/A 01/16/2017   Procedure: ESOPHAGOGASTRODUODENOSCOPY (EGD) WITH PROPOFOL;  Surgeon: [name], MD;  Location: [facility];  Service: Endoscopy;  Laterality: N/A;  730    ESOPHAGOGASTRODUODENOSCOPY (EGD)  WITH PROPOFOL N/A 11/01/2020   Procedure: ESOPHAGOGASTRODUODENOSCOPY (EGD) WITH PROPOFOL;  Surgeon: Daneil Dolin, MD;  Location: [facility];  Service: Endoscopy;  Laterality: N/A;   IR FLUORO GUIDE PORT INSERTION RIGHT  02/13/2017   IR US GUIDE VASC ACCESS RIGHT  02/13/2017   lipoma removal     PORTA CATH INSERTION Right 02/13/2017   SAVORY DILATION N/A 01/16/2017   Procedure: SAVORY DILATION;  Surgeon: [name], MD;  Location: [facility];  Service: Endoscopy;  Laterality: N/A;   Social History:  reports that he has quit smoking. His smoking use included cigarettes. He smoked an average of .25 packs per day. He has never used smokeless tobacco. He reports current drug use. Drug: Marijuana. He reports that he does not drink alcohol. Patient lives at home  Allergies  Allergen Reactions   Ketamine And Related Other (See Comments)    intolerance    Family History  Problem Relation Age of Onset   Prostate cancer Father    Colon cancer Neg Hx    Colon polyps Neg Hx       Prior to Admission medications   Medication Sig Start Date End Date Taking? Authorizing Provider  famotidine (PEPCID) 20 MG tablet Take 20 mg by mouth 2 (two) times daily. 05/22/19  Yes [provider]  gabapentin (NEURONTIN) 300 MG capsule Take 300 mg by mouth 3 (three) times daily. 06/25/21  Yes [provider]  Lactulose 20 GM/30ML SOLN Take 30 mLs (20 g total) by mouth 2 (two) times a day. 02/17/19  Yes Derek Jack, MD  methadone (DOLOPHINE) 10 MG tablet Take by mouth  See admin instructions. Take 2.5 tablets by mouth 3 times a day 07/15/21  Yes [provider]  oxycodone (ROXICODONE) 30 MG immediate release tablet Take 30 mg by mouth every 4 (four) hours as needed for pain. 07/15/21  Yes [provider]  pantoprazole (PROTONIX) 40 MG tablet Take 40 mg by mouth daily. 03/23/21  Yes [provider]  sucralfate (CARAFATE) 1 g tablet Take 1 g by mouth 4 (four) times  daily as needed (For stomach).   Yes [provider]  dicyclomine (BENTYL) 10 MG capsule Take 10 mg by mouth 4 (four) times daily as needed for spasms.  Patient not taking: Reported on 07/16/2021 05/22/19   [provider]  gabapentin (NEURONTIN) 250 MG/5ML solution Take 2 mLs by mouth 3 (three) times daily. Patient not taking: Reported on 07/16/2021 05/22/19   [provider]  Oxycodone HCl 20 MG TABS Take 1 tablet (20 mg total) by mouth every 4 (four) hours as needed. Patient not taking: Reported on 07/16/2021 03/03/19   Derek Jack, MD  thiamine 100 MG tablet Take 1 tablet by mouth daily. Patient not taking: Reported on 07/16/2021 02/13/19   [provider]    Physical Exam: BP 113/78   Pulse 74   Temp 98.8 F (37.1 C) (Oral)   Resp 14   Ht 6\' 1"  (1.854 m)   Wt 86.6 kg   SpO2 97%   BMI 25.20 kg/m   General: Middle-age male. Awake and alert and oriented x3. No acute cardiopulmonary distress.  HEENT: Normocephalic atraumatic.  Right and left ears normal in appearance.  Pupils equal, round, reactive to light. Extraocular muscles are intact. Sclerae anicteric and noninjected.  Moist mucosal membranes. No mucosal lesions.  Neck: Neck supple without lymphadenopathy. No carotid bruits. No masses palpated.  Cardiovascular: Regular rate with normal S1-S2 sounds. No murmurs, rubs, gallops auscultated. No JVD.  Respiratory: Good respiratory effort with no wheezes, rales, rhonchi. Lungs clear to auscultation bilaterally.  No accessory muscle use. Abdomen: Soft, epigastric and left upper quadrant tenderness without guarding or rebound. Nondistended. Active bowel sounds. No masses or hepatosplenomegaly  Skin: No rashes, lesions, or ulcerations.  Dry, warm to touch. 2+ dorsalis pedis and radial pulses. Musculoskeletal: No calf or leg pain. All major joints not erythematous nontender.  No upper or lower joint deformation.  Good ROM.  No contractures  Psychiatric:  Intact judgment and insight. Pleasant and cooperative. Neurologic: No focal neurological deficits. Strength is 5/5 and symmetric in upper and lower extremities.  Cranial nerves II through XII are grossly intact.           Labs on Admission: I have personally reviewed following labs and imaging studies  CBC: Recent Labs  Lab 07/16/21 1430  WBC 11.7*  NEUTROABS 10.2*  HGB 7.3*  HCT 25.3*  MCV 57.9*  PLT 376   Basic Metabolic Panel: Recent Labs  Lab 07/16/21 1430  NA 136  K 4.0  CL 105  CO2 23  GLUCOSE 119*  BUN 15  CREATININE 0.94  CALCIUM 8.1*   GFR: Estimated Creatinine Clearance: 101.5 mL/min (by C-G formula based on SCr of 0.94 mg/dL). Liver Function Tests: Recent Labs  Lab 07/16/21 1430  AST 15  ALT 11  ALKPHOS 67  BILITOT 0.4  PROT 6.7  ALBUMIN 3.5   No results for input(s): LIPASE, AMYLASE in the last 168 hours. No results for input(s): AMMONIA in the last 168 hours. Coagulation Profile: No results for input(s): INR, PROTIME in the  last 168 hours. Cardiac Enzymes: No results for input(s): CKTOTAL, CKMB, CKMBINDEX, TROPONINI in the last 168 hours. BNP (last 3 results) No results for input(s): PROBNP in the last 8760 hours. HbA1C: No results for input(s): HGBA1C in the last 72 hours. CBG: No results for input(s): GLUCAP in the last 168 hours. Lipid Profile: No results for input(s): CHOL, HDL, LDLCALC, TRIG, CHOLHDL, LDLDIRECT in the last 72 hours. Thyroid Function Tests: No results for input(s): TSH, T4TOTAL, FREET4, T3FREE, THYROIDAB in the last 72 hours. Anemia Panel: No results for input(s): VITAMINB12, FOLATE, FERRITIN, TIBC, IRON, RETICCTPCT in the last 72 hours. Urine analysis:    Component Value Date/Time   COLORURINE YELLOW 05/03/2019 0325   APPEARANCEUR CLOUDY (A) 05/03/2019 0325   LABSPEC 1.039 (H) 05/03/2019 0325   PHURINE 7.0 05/03/2019 0325   GLUCOSEU NEGATIVE 05/03/2019 0325   HGBUR NEGATIVE 05/03/2019 0325   BILIRUBINUR  NEGATIVE 05/03/2019 0325   KETONESUR NEGATIVE 05/03/2019 0325   PROTEINUR NEGATIVE 05/03/2019 0325   NITRITE NEGATIVE 05/03/2019 0325   LEUKOCYTESUR NEGATIVE 05/03/2019 0325   Sepsis Labs: @LABRCNTIP (procalcitonin:4,lacticidven:4) ) Recent Results (from the past 240 hour(s))  Resp Panel by RT-PCR (Flu A&B, Covid) Nasopharyngeal Swab     Status: Abnormal   Collection Time: 07/16/21  2:33 PM   Specimen: Nasopharyngeal Swab; Nasopharyngeal(NP) swabs in vial transport medium  Result Value Ref Range Status   SARS Coronavirus 2 by RT PCR NEGATIVE NEGATIVE Final    Comment: (NOTE) SARS-CoV-2 target nucleic acids are NOT DETECTED.  The SARS-CoV-2 RNA is generally detectable in upper respiratory specimens during the acute phase of infection. The lowest concentration of SARS-CoV-2 viral copies this assay can detect is 138 copies/mL. A negative result does not preclude SARS-Cov-2 infection and should not be used as the sole basis for treatment or other patient management decisions. A negative result may occur with  improper specimen collection/handling, submission of specimen other than nasopharyngeal swab, presence of viral mutation(s) within the areas targeted by this assay, and inadequate number of viral copies(<138 copies/mL). A negative result must be combined with clinical observations, patient history, and epidemiological information. The expected result is Negative.  Fact Sheet for Patients:  EntrepreneurPulse.com.au  Fact Sheet for Healthcare Providers:  IncredibleEmployment.be  This test is no t yet approved or cleared by the [place] FDA and  has been authorized for detection and/or diagnosis of SARS-CoV-2 by FDA under an Emergency Use Authorization (EUA). This EUA will remain  in effect (meaning this test can be used) for the duration of the COVID-19 declaration under Section 564(b)(1) of the Act, 21 U.S.C.section 360bbb-3(b)(1),  unless the authorization is terminated  or revoked sooner.       Influenza A by PCR POSITIVE (A) NEGATIVE Final   Influenza B by PCR NEGATIVE NEGATIVE Final    Comment: (NOTE) The Xpert Xpress SARS-CoV-2/FLU/RSV plus assay is intended as an aid in the diagnosis of influenza from Nasopharyngeal swab specimens and should not be used as a sole basis for treatment. Nasal washings and aspirates are unacceptable for Xpert Xpress SARS-CoV-2/FLU/RSV testing.  Fact Sheet for Patients: EntrepreneurPulse.com.au  Fact Sheet for Healthcare Providers: IncredibleEmployment.be  This test is not yet approved or cleared by the [place] FDA and has been authorized for detection and/or diagnosis of SARS-CoV-2 by FDA under an Emergency Use Authorization (EUA). This EUA will remain in effect (meaning this test can be used) for the duration of the COVID-19 declaration under Section 564(b)(1) of the Act, 21 U.S.C. section 360bbb-3(b)(1),  unless the authorization is terminated or revoked.  Performed at [facility], [address] [place], [place] [address]      Radiological Exams on Admission: No results found.  EKG: none  Assessment/Plan: Principal Problem:   Influenza A Active Problems:   Iron deficiency anemia due to chronic blood loss   Type 2 diabetes mellitus without complication, without long-term current use of insulin (HCC)   Essential hypertension   Esophageal cancer (HCC)   GERD (gastroesophageal reflux disease)   Acute upper GI bleed    This patient was discussed with the ED physician, including pertinent vitals, physical exam findings, labs, and imaging.  We also discussed care given by the ED provider.  Influenza A At this point the patient is outside the window for Tamiflu.  We will continue with symptomatic treatment with IV fluids and antiemetics Upper GI bleed GI consulted Start IV Protonix Transfuse 2 units Clear liquids  tonight and n.p.o. after midnight Acute anemia Infuse 2 units CBC in the morning Type 2 diabetes CBGs before meals and nightly Hypertension Continue antihypertensives GERD Esophageal cancer  DVT prophylaxis: SCDs Consultants: GI Code Status: Full code Family Communication: None Disposition Plan:    Truett Mainland, DO

## 2021-07-16 NOTE — ED Triage Notes (Signed)
Cough x 1 week, Today he was coughing and felt a cramp in stomach. Then had 1 episode of dark blood in stool  vomited twice with bright blood in vomit. Stage 4 esophageal cancer

## 2021-07-17 ENCOUNTER — Encounter (HOSPITAL_COMMUNITY): Payer: Self-pay | Admitting: Family Medicine

## 2021-07-17 ENCOUNTER — Inpatient Hospital Stay (HOSPITAL_COMMUNITY): Payer: Medicaid Other | Admitting: Anesthesiology

## 2021-07-17 ENCOUNTER — Encounter (HOSPITAL_COMMUNITY)
Admission: EM | Disposition: A | Discharge status: Home / Self Care | Payer: Self-pay | Source: Home / Self Care | Attending: Emergency Medicine

## 2021-07-17 DIAGNOSIS — K920 Hematemesis: Secondary | ICD-10-CM | POA: Diagnosis not present

## 2021-07-17 DIAGNOSIS — K254 Chronic or unspecified gastric ulcer with hemorrhage: Secondary | ICD-10-CM

## 2021-07-17 DIAGNOSIS — C159 Malignant neoplasm of esophagus, unspecified: Secondary | ICD-10-CM | POA: Diagnosis not present

## 2021-07-17 DIAGNOSIS — J101 Influenza due to other identified influenza virus with other respiratory manifestations: Secondary | ICD-10-CM | POA: Diagnosis not present

## 2021-07-17 DIAGNOSIS — C160 Malignant neoplasm of cardia: Secondary | ICD-10-CM

## 2021-07-17 DIAGNOSIS — E119 Type 2 diabetes mellitus without complications: Secondary | ICD-10-CM | POA: Diagnosis not present

## 2021-07-17 DIAGNOSIS — D500 Iron deficiency anemia secondary to blood loss (chronic): Secondary | ICD-10-CM | POA: Diagnosis not present

## 2021-07-17 DIAGNOSIS — Z20822 Contact with and (suspected) exposure to covid-19: Secondary | ICD-10-CM | POA: Diagnosis not present

## 2021-07-17 HISTORY — PX: ESOPHAGOGASTRODUODENOSCOPY (EGD) WITH PROPOFOL: SHX5813

## 2021-07-17 LAB — HIV ANTIBODY (ROUTINE TESTING W REFLEX): HIV Screen 4th Generation wRfx: NONREACTIVE

## 2021-07-17 LAB — TYPE AND SCREEN
ABO/RH(D): O POS
Antibody Screen: NEGATIVE
Unit division: 0
Unit division: 0

## 2021-07-17 LAB — GLUCOSE, CAPILLARY
Glucose-Capillary: 100 mg/dL — ABNORMAL HIGH (ref 70–99)
Glucose-Capillary: 127 mg/dL — ABNORMAL HIGH (ref 70–99)

## 2021-07-17 LAB — CBC
HCT: 27.7 % — ABNORMAL LOW (ref 39.0–52.0)
Hemoglobin: 8.5 g/dL — ABNORMAL LOW (ref 13.0–17.0)
MCH: 19.3 pg — ABNORMAL LOW (ref 26.0–34.0)
MCHC: 30.7 g/dL (ref 30.0–36.0)
MCV: 63 fL — ABNORMAL LOW (ref 80.0–100.0)
Platelets: 223 10*3/uL (ref 150–400)
RBC: 4.4 MIL/uL (ref 4.22–5.81)
RDW: 26.1 % — ABNORMAL HIGH (ref 11.5–15.5)
WBC: 9.2 10*3/uL (ref 4.0–10.5)
nRBC: 0 % (ref 0.0–0.2)

## 2021-07-17 LAB — BPAM RBC
Blood Product Expiration Date: 202301032359
Blood Product Expiration Date: 202301032359
ISSUE DATE / TIME: 202212031913
ISSUE DATE / TIME: 202212032146
Unit Type and Rh: 5100
Unit Type and Rh: 5100

## 2021-07-17 LAB — BASIC METABOLIC PANEL
Anion gap: 5 (ref 5–15)
BUN: 17 mg/dL (ref 6–20)
CO2: 23 mmol/L (ref 22–32)
Calcium: 8.1 mg/dL — ABNORMAL LOW (ref 8.9–10.3)
Chloride: 108 mmol/L (ref 98–111)
Creatinine, Ser: 0.78 mg/dL (ref 0.61–1.24)
GFR, Estimated: >60 mL/min (ref >60)
Glucose, Bld: 103 mg/dL — ABNORMAL HIGH (ref 70–99)
Potassium: 3.9 mmol/L (ref 3.5–5.1)
Sodium: 136 mmol/L (ref 135–145)

## 2021-07-17 SURGERY — ESOPHAGOGASTRODUODENOSCOPY (EGD) WITH PROPOFOL
Anesthesia: General

## 2021-07-17 MED ORDER — PROPOFOL 10 MG/ML IV BOLUS
INTRAVENOUS | Status: DC | PRN
  Administered 2021-07-17: 100 mg via INTRAVENOUS

## 2021-07-17 MED ORDER — SODIUM CHLORIDE 0.9 % IV SOLN: INTRAVENOUS | Status: DC

## 2021-07-17 MED ORDER — PANTOPRAZOLE SODIUM 40 MG PO TBEC
40.0000 mg | DELAYED_RELEASE_TABLET | Freq: Two times a day (BID) | ORAL | 2 refills | Status: DC
Start: 2021-07-17 — End: 2023-04-17

## 2021-07-17 MED ORDER — LACTATED RINGERS IV SOLN: INTRAVENOUS | Status: DC | PRN

## 2021-07-17 MED ORDER — PROPOFOL 10 MG/ML IV BOLUS
INTRAVENOUS | Status: AC
  Filled 2021-07-17: qty 20

## 2021-07-17 MED ORDER — CHLORHEXIDINE GLUCONATE CLOTH 2 % EX PADS: 6.0000 | MEDICATED_PAD | Freq: Every day | CUTANEOUS | Status: DC

## 2021-07-17 MED ORDER — SUCRALFATE 1 G PO TABS: 1.0000 g | ORAL_TABLET | Freq: Three times a day (TID) | ORAL | 0 refills | Status: DC

## 2021-07-17 NOTE — Transfer of Care (Signed)
Immediate Anesthesia Transfer of Care Note  Patient: Maxwell Henderson  Procedure(s) Performed: ESOPHAGOGASTRODUODENOSCOPY (EGD) WITH PROPOFOL  Patient Location: PACU  Anesthesia Type:General  Level of Consciousness: awake and oriented  Airway & Oxygen Therapy: Patient Spontanous Breathing  Post-op Assessment: Report given to RN and Post -op Vital signs reviewed and stable  Post vital signs: Reviewed and stable  Last Vitals:  Vitals Value Taken Time  BP    Temp    Pulse    Resp    SpO2      Last Pain:  Vitals:   07/17/21 1055  TempSrc: Oral  PainSc:          Complications: No notable events documented.

## 2021-07-17 NOTE — Discharge Summary (Signed)
Physician Discharge Summary  Chez Bulnes XVQ:[id number] DOB: 1966-03-23 DOA: 07/16/2021  PCP: [name], FNP  Admit date: 07/16/2021  Discharge date: 07/17/2021  Admitted From:Home  Disposition:  Home  Recommendations for Outpatient Follow-up:  Follow up with PCP in 1-2 weeks Follow-up with oncologist as scheduled and is already enrolled in palliative chemotherapy and radiation therapy at [facility] Continue PPI twice daily as prescribed as well as Carafate as prescribed Avoid NSAIDs Okay to continue other home medications Consider palliative follow-up outpatient  Home Health: None  Equipment/Devices: None  Discharge Condition:Stable  CODE STATUS: Full  Diet recommendation: Heart Healthy/Carb Modified  Brief/Interim Summary: Per HPI: Maxwell Henderson is a 55 y.o. male with a history of type 2 diabetes without complication, hypertension, chronic anemia, esophageal adenocarcinoma.  Patient has been feeling sick for the past week and has steadily been coming worse with cough and general malaise.  Over the past 2 to 3 days he has had 2 bottles of Robitussin and 2 bottles of cough syrup.  This morning, he began to have abdominal pain with nausea.  He tried to eat a biscuit but ended up vomiting red liquid, which tasted sweet.  A little later, the patient had an episode of black diarrhea.  He is continued to have some nonradiating central abdominal pain.  He has not had much to eat or drink today.  -Patient was admitted with acute blood loss anemia secondary to upper GI bleed along with noted influenza A.  He was transfused 2 units of PRBCs with improvement in his hemoglobin levels.  He has undergone EGD today with noted medium sized ulcerating mass at the GE junction corresponding to his malignancy.  He was also noted to have an extremely large, but mildly oozing and friable gastric ulcer extending from the esophageal mass.  He does not have any visible  vessels or other active bleeding and therefore no hemostasis was required.  He has been recommended to remain on PPI twice daily indefinitely and to also start sucralfate 1 g p.o. 4 times daily.  He is stable for discharge from GI standpoint with no further active bleeding noted.  He is to follow-up with his oncology team at [facility] as previously scheduled.  Discharge Diagnoses:  Principal Problem:   Influenza A Active Problems:   Iron deficiency anemia due to chronic blood loss   Type 2 diabetes mellitus without complication, without long-term current use of insulin (HCC)   Essential hypertension   Esophageal cancer (HCC)   GERD (gastroesophageal reflux disease)   Acute upper GI bleed  Principal discharge diagnosis: Acute blood loss anemia secondary to upper GI bleed related to esophageal mass along with gastric ulcer.  Influenza A.  Discharge Instructions  Discharge Instructions     Diet - low sodium heart healthy   Complete by: As directed    Increase activity slowly   Complete by: As directed       Allergies as of 07/17/2021       Reactions   Ketamine And Related Other (See Comments)   intolerance        Medication List     TAKE these medications    dicyclomine 10 MG capsule Commonly known as: BENTYL Take 10 mg by mouth 4 (four) times daily as needed for spasms.   famotidine 20 MG tablet Commonly known as: PEPCID Take 20 mg by mouth 2 (two) times daily.   gabapentin 300 MG capsule Commonly known as: NEURONTIN  Take 300 mg by mouth 3 (three) times daily. What changed: Another medication with the same name was removed. Continue taking this medication, and follow the directions you see here.   Lactulose 20 GM/30ML Soln Take 30 mLs (20 g total) by mouth 2 (two) times a day.   methadone 10 MG tablet Commonly known as: DOLOPHINE Take by mouth See admin instructions. Take 2.5 tablets by mouth 3 times a day   oxycodone 30 MG immediate release tablet Commonly  known as: ROXICODONE Take 30 mg by mouth every 4 (four) hours as needed for pain. What changed: Another medication with the same name was removed. Continue taking this medication, and follow the directions you see here.   pantoprazole 40 MG tablet Commonly known as: PROTONIX Take 1 tablet (40 mg total) by mouth 2 (two) times daily. What changed: when to take this   sucralfate 1 g tablet Commonly known as: CARAFATE Take 1 tablet (1 g total) by mouth 4 (four) times daily -  with meals and at bedtime. What changed:  when to take this reasons to take this   thiamine 100 MG tablet Take 1 tablet by mouth daily.        Follow-up Information     House, Deliah Goody, FNP. Schedule an appointment as soon as possible for a visit in 1 week(s).   Specialty: Nurse Practitioner Contact information: [address] [place] [address] [phone]                Allergies  Allergen Reactions   Ketamine And Related Other (See Comments)    intolerance    Consultations: GI   Procedures/Studies: No results found.   Discharge Exam: Vitals:   07/17/21 1115 07/17/21 1144  BP: 127/89 (!) 141/92  Pulse: 86 90  Resp: 18 18  Temp:  98.7 F (37.1 C)  SpO2: 100% 100%   Vitals:   07/17/21 1055 07/17/21 1111 07/17/21 1115 07/17/21 1144  BP: (!) 156/93 113/89 127/89 (!) 141/92  Pulse: 86 87 86 90  Resp: 18  18 18   Temp: 97.7 F (36.5 C) (!) 97.4 F (36.3 C)  98.7 F (37.1 C)  TempSrc: Oral   Oral  SpO2: 97% 97% 100% 100%  Weight:      Height:        General: Pt is alert, awake, not in acute distress Cardiovascular: RRR, S1/S2 +, no rubs, no gallops Respiratory: CTA bilaterally, no wheezing, no rhonchi Abdominal: Soft, NT, ND, bowel sounds + Extremities: no edema, no cyanosis    The results of significant diagnostics from this hospitalization (including imaging, microbiology, ancillary and laboratory) are listed below for reference.     Microbiology: Recent Results  (from the past 240 hour(s))  Resp Panel by RT-PCR (Flu A&B, Covid) Nasopharyngeal Swab     Status: Abnormal   Collection Time: 07/16/21  2:33 PM   Specimen: Nasopharyngeal Swab; Nasopharyngeal(NP) swabs in vial transport medium  Result Value Ref Range Status   SARS Coronavirus 2 by RT PCR NEGATIVE NEGATIVE Final    Comment: (NOTE) SARS-CoV-2 target nucleic acids are NOT DETECTED.  The SARS-CoV-2 RNA is generally detectable in upper respiratory specimens during the acute phase of infection. The lowest concentration of SARS-CoV-2 viral copies this assay can detect is 138 copies/mL. A negative result does not preclude SARS-Cov-2 infection and should not be used as the sole basis for treatment or other patient management decisions. A negative result may occur with  improper specimen collection/handling, submission of  specimen other than nasopharyngeal swab, presence of viral mutation(s) within the areas targeted by this assay, and inadequate number of viral copies(<138 copies/mL). A negative result must be combined with clinical observations, patient history, and epidemiological information. The expected result is Negative.  Fact Sheet for Patients:  EntrepreneurPulse.com.au  Fact Sheet for Healthcare Providers:  IncredibleEmployment.be  This test is no t yet approved or cleared by the [place] FDA and  has been authorized for detection and/or diagnosis of SARS-CoV-2 by FDA under an Emergency Use Authorization (EUA). This EUA will remain  in effect (meaning this test can be used) for the duration of the COVID-19 declaration under Section 564(b)(1) of the Act, 21 U.S.C.section 360bbb-3(b)(1), unless the authorization is terminated  or revoked sooner.       Influenza A by PCR POSITIVE (A) NEGATIVE Final   Influenza B by PCR NEGATIVE NEGATIVE Final    Comment: (NOTE) The Xpert Xpress SARS-CoV-2/FLU/RSV plus assay is intended as an aid in the  diagnosis of influenza from Nasopharyngeal swab specimens and should not be used as a sole basis for treatment. Nasal washings and aspirates are unacceptable for Xpert Xpress SARS-CoV-2/FLU/RSV testing.  Fact Sheet for Patients: EntrepreneurPulse.com.au  Fact Sheet for Healthcare Providers: IncredibleEmployment.be  This test is not yet approved or cleared by the [place] FDA and has been authorized for detection and/or diagnosis of SARS-CoV-2 by FDA under an Emergency Use Authorization (EUA). This EUA will remain in effect (meaning this test can be used) for the duration of the COVID-19 declaration under Section 564(b)(1) of the Act, 21 U.S.C. section 360bbb-3(b)(1), unless the authorization is terminated or revoked.  Performed at [facility], [address] [place], [place] [address]      Labs: BNP (last 3 results) No results for input(s): BNP in the last 8760 hours. Basic Metabolic Panel: Recent Labs  Lab 07/16/21 1430 07/17/21 0530  NA 136 136  K 4.0 3.9  CL 105 108  CO2 23 23  GLUCOSE 119* 103*  BUN 15 17  CREATININE 0.94 0.78  CALCIUM 8.1* 8.1*   Liver Function Tests: Recent Labs  Lab 07/16/21 1430  AST 15  ALT 11  ALKPHOS 67  BILITOT 0.4  PROT 6.7  ALBUMIN 3.5   No results for input(s): LIPASE, AMYLASE in the last 168 hours. No results for input(s): AMMONIA in the last 168 hours. CBC: Recent Labs  Lab 07/16/21 1430 07/17/21 0530  WBC 11.7* 9.2  NEUTROABS 10.2*  --   HGB 7.3* 8.5*  HCT 25.3* 27.7*  MCV 57.9* 63.0*  PLT 288 223   Cardiac Enzymes: No results for input(s): CKTOTAL, CKMB, CKMBINDEX, TROPONINI in the last 168 hours. BNP: Invalid input(s): POCBNP CBG: Recent Labs  Lab 07/16/21 2221 07/17/21 0821 07/17/21 1141  GLUCAP 119* 100* 127*   D-Dimer No results for input(s): DDIMER in the last 72 hours. Hgb A1c No results for input(s): HGBA1C in the last 72 hours. Lipid Profile No results  for input(s): CHOL, HDL, LDLCALC, TRIG, CHOLHDL, LDLDIRECT in the last 72 hours. Thyroid function studies No results for input(s): TSH, T4TOTAL, T3FREE, THYROIDAB in the last 72 hours.  Invalid input(s): FREET3 Anemia work up No results for input(s): VITAMINB12, FOLATE, FERRITIN, TIBC, IRON, RETICCTPCT in the last 72 hours. Urinalysis    Component Value Date/Time   COLORURINE YELLOW 05/03/2019 0325   APPEARANCEUR CLOUDY (A) 05/03/2019 0325   LABSPEC 1.039 (H) 05/03/2019 0325   PHURINE 7.0 05/03/2019 0325   GLUCOSEU NEGATIVE 05/03/2019 0325  HGBUR NEGATIVE 05/03/2019 0325   BILIRUBINUR NEGATIVE 05/03/2019 0325   KETONESUR NEGATIVE 05/03/2019 0325   PROTEINUR NEGATIVE 05/03/2019 0325   NITRITE NEGATIVE 05/03/2019 0325   LEUKOCYTESUR NEGATIVE 05/03/2019 0325   Sepsis Labs Invalid input(s): PROCALCITONIN,  WBC,  LACTICIDVEN Microbiology Recent Results (from the past 240 hour(s))  Resp Panel by RT-PCR (Flu A&B, Covid) Nasopharyngeal Swab     Status: Abnormal   Collection Time: 07/16/21  2:33 PM   Specimen: Nasopharyngeal Swab; Nasopharyngeal(NP) swabs in vial transport medium  Result Value Ref Range Status   SARS Coronavirus 2 by RT PCR NEGATIVE NEGATIVE Final    Comment: (NOTE) SARS-CoV-2 target nucleic acids are NOT DETECTED.  The SARS-CoV-2 RNA is generally detectable in upper respiratory specimens during the acute phase of infection. The lowest concentration of SARS-CoV-2 viral copies this assay can detect is 138 copies/mL. A negative result does not preclude SARS-Cov-2 infection and should not be used as the sole basis for treatment or other patient management decisions. A negative result may occur with  improper specimen collection/handling, submission of specimen other than nasopharyngeal swab, presence of viral mutation(s) within the areas targeted by this assay, and inadequate number of viral copies(<138 copies/mL). A negative result must be combined with clinical  observations, patient history, and epidemiological information. The expected result is Negative.  Fact Sheet for Patients:  EntrepreneurPulse.com.au  Fact Sheet for Healthcare Providers:  IncredibleEmployment.be  This test is no t yet approved or cleared by the [place] FDA and  has been authorized for detection and/or diagnosis of SARS-CoV-2 by FDA under an Emergency Use Authorization (EUA). This EUA will remain  in effect (meaning this test can be used) for the duration of the COVID-19 declaration under Section 564(b)(1) of the Act, 21 U.S.C.section 360bbb-3(b)(1), unless the authorization is terminated  or revoked sooner.       Influenza A by PCR POSITIVE (A) NEGATIVE Final   Influenza B by PCR NEGATIVE NEGATIVE Final    Comment: (NOTE) The Xpert Xpress SARS-CoV-2/FLU/RSV plus assay is intended as an aid in the diagnosis of influenza from Nasopharyngeal swab specimens and should not be used as a sole basis for treatment. Nasal washings and aspirates are unacceptable for Xpert Xpress SARS-CoV-2/FLU/RSV testing.  Fact Sheet for Patients: EntrepreneurPulse.com.au  Fact Sheet for Healthcare Providers: IncredibleEmployment.be  This test is not yet approved or cleared by the [place] FDA and has been authorized for detection and/or diagnosis of SARS-CoV-2 by FDA under an Emergency Use Authorization (EUA). This EUA will remain in effect (meaning this test can be used) for the duration of the COVID-19 declaration under Section 564(b)(1) of the Act, 21 U.S.C. section 360bbb-3(b)(1), unless the authorization is terminated or revoked.  Performed at [facility], [address], [place], [place] [address]      Time coordinating discharge: 35 minutes  SIGNED:   Rodena Goldmann, DO [facility] 07/17/2021, 12:09 PM  If 7PM-7AM, please contact night-coverage [email or web address]

## 2021-07-17 NOTE — Consult Note (Signed)
[name], M.D. Gastroenterology & Hepatology                                           Patient Name: Maxwell Henderson Account #: _0 @   MRN: [id number] Admission Date: 07/16/2021 Date of Evaluation:  07/17/2021 Time of Evaluation: 10:30 AM   Referring Physician: `Pratik Manuella Ghazi. DO``  Chief Complaint: Hematemesis  HPI:  This is a 55 y.o. male with history of stage IV metastatic esophageal adenocarcinoma who was put on palliative chemotherapy and radiation complicated by recurrent bleeding from mass, diabetes, hypertension and iron deficiency anemia, who came to the hospital after presenting new episode of hematemesis.  History is provided by the patient and his wife.  He states that after Thanksgiving he reported feeling general malaise and was presenting recurrent episodes of cough and mild shortness of breath.  Due to the persistent malaise he decided to take cough syrup multiple times however it is unclear which one (he describes it as a red-colored syrup) and also Alka-Seltzer.  He denies taking any NSAIDs but he reports that he was taking Tylenol due to the general malaise.  He states that yesterday he drank the cough syrup and later during the day he presented an episode of vomiting red contents.  He believes that the contents he vomited were the cough syrup as it tasted similar to it.  Denies any more episodes of hematemesis since then or nausea/vomiting.  Stated he had transient epigastric abdominal pain but this had resolved. The patient denies having any chills, hematochezia, melena, hematemesis, abdominal distention, abdominal pain, diarrhea, jaundice, pruritus.  Based on the reports that I could get from [facility], the patient is already enrolled in palliative chemotherapy and radiation therapy.  He was not considered a surgical candidate given the extension of his malignancy.  However, he has presented response to treatment as he has presented decreased  size of the malignancy at the GE junction.  However, his last CT of the chest and abdomen and pelvis performed on 03/31/2021 showed increased apparent ulceration at the area of the gastric cardia.  In the ED, he was slightly tachycardic but with normal blood pressure, he was afebrile. Labs were remarkable for hemoglobin of 7.3 (baseline of 9.2 in March), although cell count 11.7, platelets 288, CMP was within normal limits.  He had a positive testing for influenza A.  The patient was transfused 2 units of PRBC with repeat hemoglobin today of 8.5.   Last EGD: Based on notes in care everywhere his last EGD was on 2//07/2020, showing significant progression of the mass Last Colonoscopy: Reports he had one performed a couple of years ago but no reports are available  Past Medical History: SEE CHRONIC ISSSUES: Past Medical History:  Diagnosis Date   Diabetes mellitus without complication ([place])    Hypertension    Iron deficiency anemia due to chronic blood loss 02/28/2017   Primary esophageal adenocarcinoma ([place]) 01/29/2017   Past Surgical History:  Past Surgical History:  Procedure Laterality Date   ESOPHAGOGASTRODUODENOSCOPY (EGD) WITH PROPOFOL N/A 01/16/2017   Procedure: ESOPHAGOGASTRODUODENOSCOPY (EGD) WITH PROPOFOL;  Surgeon: [name], MD;  [facility];  Service: Endoscopy;  Laterality: N/A;  730    ESOPHAGOGASTRODUODENOSCOPY (EGD) WITH PROPOFOL N/A 11/01/2020   Procedure: ESOPHAGOGASTRODUODENOSCOPY (EGD) WITH PROPOFOL;  Surgeon: Daneil Dolin, MD;  [facility]: [facility];  Service: Endoscopy;  Laterality: N/A;   IR FLUORO GUIDE PORT INSERTION RIGHT  02/13/2017   IR US GUIDE VASC ACCESS RIGHT  02/13/2017   lipoma removal     PORTA CATH INSERTION Right 02/13/2017   SAVORY DILATION N/A 01/16/2017   Procedure: SAVORY DILATION;  Surgeon: [name], MD;  [facility];  Service: Endoscopy;  Laterality: N/A;   Family History:  Family History  Problem Relation Age  of Onset   Prostate cancer Father    Colon cancer Neg Hx    Colon polyps Neg Hx    Social History:  Social History   Tobacco Use   Smoking status: Former    Packs/day: 0.25    Types: Cigarettes   Smokeless tobacco: Never  Vaping Use   Vaping Use: Never used  Substance Use Topics   Alcohol use: No    Comment: hx heavy alcohol, hasn't drank in 22 yrs   Drug use: Yes    Types: Marijuana    Home Medications:  Prior to Admission medications   Medication Sig Start Date End Date Taking? Authorizing Provider  famotidine (PEPCID) 20 MG tablet Take 20 mg by mouth 2 (two) times daily. 05/22/19  Yes [provider]  gabapentin (NEURONTIN) 300 MG capsule Take 300 mg by mouth 3 (three) times daily. 06/25/21  Yes [provider]  Lactulose 20 GM/30ML SOLN Take 30 mLs (20 g total) by mouth 2 (two) times a day. 02/17/19  Yes Derek Jack, MD  methadone (DOLOPHINE) 10 MG tablet Take by mouth See admin instructions. Take 2.5 tablets by mouth 3 times a day 07/15/21  Yes [provider]  oxycodone (ROXICODONE) 30 MG immediate release tablet Take 30 mg by mouth every 4 (four) hours as needed for pain. 07/15/21  Yes [provider]  pantoprazole (PROTONIX) 40 MG tablet Take 40 mg by mouth daily. 03/23/21  Yes [provider]  sucralfate (CARAFATE) 1 g tablet Take 1 g by mouth 4 (four) times daily as needed (For stomach).   Yes [provider]  dicyclomine (BENTYL) 10 MG capsule Take 10 mg by mouth 4 (four) times daily as needed for spasms.  Patient not taking: Reported on 07/16/2021 05/22/19   [provider]  gabapentin (NEURONTIN) 250 MG/5ML solution Take 2 mLs by mouth 3 (three) times daily. Patient not taking: Reported on 07/16/2021 05/22/19   [provider]  Oxycodone HCl 20 MG TABS Take 1 tablet (20 mg total) by mouth every 4 (four) hours as needed. Patient not taking: Reported on 07/16/2021 03/03/19   Derek Jack, MD   thiamine 100 MG tablet Take 1 tablet by mouth daily. Patient not taking: Reported on 07/16/2021 02/13/19   [provider]    Inpatient Medications:  Current Facility-Administered Medications:    0.9 %  sodium chloride infusion, 10 mL/hr, Intravenous, Once, Truett Mainland, DO   acetaminophen (TYLENOL) tablet 650 mg, 650 mg, Oral, Q6H PRN **OR** acetaminophen (TYLENOL) suppository 650 mg, 650 mg, Rectal, Q6H PRN, Stinson, Jacob J, DO   Chlorhexidine Gluconate Cloth 2 % PADS 6 each, 6 each, Topical, Daily, Truett Mainland, DO   gabapentin (NEURONTIN) capsule 300 mg, 300 mg, Oral, TID, Stinson, Jacob J, DO, 300 mg at 07/17/21 0854   insulin aspart (novoLOG) injection 0-5 Units, 0-5 Units, Subcutaneous, QHS, Stinson, Jacob J, DO   insulin aspart (novoLOG) injection 0-9 Units, 0-9 Units, Subcutaneous, TID WC, Stinson, Jacob J, DO   lactated ringers infusion, ,  Intravenous, Continuous, Truett Mainland, DO, Last Rate: 100 mL/hr at 07/17/21 0015, New Bag at 07/17/21 0015   lactulose (CHRONULAC) 10 GM/15ML solution 20 g, 20 g, Oral, BID, Truett Mainland, DO, 20 g at 07/17/21 1610   methadone (DOLOPHINE) tablet 25 mg, 25 mg, Oral, TID, Truett Mainland, DO, 25 mg at 07/17/21 0853   ondansetron (ZOFRAN) tablet 4 mg, 4 mg, Oral, Q6H PRN **OR** ondansetron (ZOFRAN) injection 4 mg, 4 mg, Intravenous, Q6H PRN, Truett Mainland, DO, 4 mg at 07/16/21 2129   oxyCODONE (Oxy IR/ROXICODONE) immediate release tablet 30 mg, 30 mg, Oral, Q4H PRN, Truett Mainland, DO, 30 mg at 07/16/21 2129   pantoprazole (PROTONIX) injection 40 mg, 40 mg, Intravenous, Q12H, Truett Mainland, DO, 40 mg at 07/17/21 0849   sucralfate (CARAFATE) tablet 1 g, 1 g, Oral, QID PRN, Truett Mainland, DO, 1 g at 07/16/21 2129 Allergies: Ketamine and related  Complete Review of Systems: GENERAL: negative for malaise, night sweats HEENT: No changes in hearing or vision, no nose bleeds or other nasal problems. NECK: Negative for  lumps, goiter, pain and significant neck swelling RESPIRATORY: Negative for cough, wheezing CARDIOVASCULAR: Negative for chest pain, leg swelling, palpitations, orthopnea GI: SEE HPI MUSCULOSKELETAL: Negative for joint pain or swelling, back pain, and muscle pain. SKIN: Negative for lesions, rash PSYCH: Negative for sleep disturbance, mood disorder and recent psychosocial stressors. HEMATOLOGY Negative for prolonged bleeding, bruising easily, and swollen nodes. ENDOCRINE: Negative for cold or heat intolerance, polyuria, polydipsia and goiter. NEURO: negative for tremor, gait imbalance, syncope and seizures. The remainder of the review of systems is noncontributory.  Physical Exam: BP (!) 139/94 (BP Location: Left Arm)   Pulse 73   Temp 98.8 F (37.1 C)   Resp 16   Ht _0  (1.854 m)   Wt 84.4 kg   SpO2 100%   BMI 24.55 kg/m  GENERAL: The patient is AO x3, in no acute distress. HEENT: Head is normocephalic and atraumatic. EOMI are intact. Mouth is well hydrated and without lesions. NECK: Supple. No masses LUNGS: Clear to auscultation. No presence of rhonchi/wheezing/rales. Adequate chest expansion HEART: RRR, normal s1 and s2. ABDOMEN: Soft, nontender, no guarding, no peritoneal signs, and nondistended. BS +. No masses. EXTREMITIES: Without any cyanosis, clubbing, rash, lesions or edema. NEUROLOGIC: AOx3, no focal motor deficit. SKIN: no jaundice, no rashes  Laboratory Data CBC:     Component Value Date/Time   WBC 9.2 07/17/2021 0530   RBC 4.40 07/17/2021 0530   HGB 8.5 (L) 07/17/2021 0530   HCT 27.7 (L) 07/17/2021 0530   PLT 223 07/17/2021 0530   MCV 63.0 (L) 07/17/2021 0530   MCH 19.3 (L) 07/17/2021 0530   MCHC 30.7 07/17/2021 0530   RDW 26.1 (H) 07/17/2021 0530   LYMPHSABS 1.0 07/16/2021 1430   MONOABS 0.4 07/16/2021 1430   EOSABS 0.0 07/16/2021 1430   BASOSABS 0.0 07/16/2021 1430   COAG:  Lab Results  Component Value Date   INR 1.05 02/13/2017   INR 1.07  02/02/2017    BMP:  BMP Latest Ref Rng & Units 07/17/2021 07/16/2021 11/01/2020  Glucose 70 - 99 mg/dL 103(H) 119(H) 102(H)  BUN 6 - 20 mg/dL _1 Creatinine 0.61 - 1.24 mg/dL 0.78 0.94 1.07  Sodium 135 - 145 mmol/L 136 136 139  Potassium 3.5 - 5.1 mmol/L 3.9 4.0 3.5  Chloride 98 - 111 mmol/L 108 105 106  CO2 22 - 32 mmol/L 23 23  25  Calcium 8.9 - 10.3 mg/dL 8.1(L) 8.1(L) 8.8(L)    HEPATIC:  Hepatic Function Latest Ref Rng & Units 07/16/2021 11/01/2020 09/24/2019  Total Protein 6.5 - 8.1 g/dL 6.7 7.5 6.1(L)  Albumin 3.5 - 5.0 g/dL 3.5 4.3 2.9(L)  AST 15 - 41 U/L _0 ALT 0 - 44 U/L _1 Alk Phosphatase 38 - 126 U/L 67 66 55  Total Bilirubin 0.3 - 1.2 mg/dL 0.4 0.7 0.3  Bilirubin, Direct 0.0 - 0.2 mg/dL - - -    CARDIAC:  Lab Results  Component Value Date   TROPONINI <0.03 02/02/2019     Imaging: I personally reviewed and interpreted the available imaging.  Assessment & Plan: Maxwell Henderson is a 55 y.o. male with history of stage IV metastatic esophageal adenocarcinoma who was put on palliative chemotherapy and radiation complicated by recurrent bleeding from mass, diabetes, hypertension and iron deficiency anemia, who came to the hospital after presenting new episode of hematemesis.  The patient has presented an episode of hematemesis with transient tachycardia but with improvement after IV fluids were started.  However he had a drop in his hemoglobin possibly related to the bleeding.  I consider that given the history of esophageal malignancy and the findings in the most recent CT scan, he may be having a bleeding from the ulcerated area in his stomach.  This is likely related to tumor bleeding exacerbated by the recent intake of Alka-Seltzer.  For now, we will keep him on a PPI twice daily and we will evaluate his symptoms further with an EGD.  If the bleeding is coming from a tumor bleeding, this may be difficult to control and may need to use Hemospray to slow down  the severity of the bleeding.  Patient understood and agreed.  - Repeat CBC qday, transfuse if Hb <7 - Pantoprazole 40 mg q12h IVP - 2 large bore IV lines - Active T/S - Keep NPO - Avoid NSAIDs - Will proceed with EGD today  Harvel Quale, MD Gastroenterology and Hepatology [facility]

## 2021-07-17 NOTE — Anesthesia Preprocedure Evaluation (Signed)
Anesthesia Evaluation  Patient identified by MRN, date of birth, ID band Patient awake    Reviewed: Allergy & Precautions, H&P , NPO status , Patient's Chart, lab work & pertinent test results, reviewed documented beta blocker date and time   Airway Mallampati: II  TM Distance: >3 FB Neck ROM: full    Dental no notable dental hx.    Pulmonary neg pulmonary ROS, former smoker,    Pulmonary exam normal breath sounds clear to auscultation       Cardiovascular Exercise Tolerance: Good hypertension, negative cardio ROS   Rhythm:regular Rate:Normal     Neuro/Psych negative neurological ROS  negative psych ROS   GI/Hepatic Neg liver ROS, GERD  Medicated,  Endo/Other  negative endocrine ROSdiabetes, Type 2  Renal/GU negative Renal ROS  negative genitourinary   Musculoskeletal   Abdominal   Peds  Hematology  (+) Blood dyscrasia, anemia ,   Anesthesia Other Findings   Reproductive/Obstetrics negative OB ROS                             Anesthesia Physical Anesthesia Plan  ASA: 3 and emergent  Anesthesia Plan: General   Post-op Pain Management:    Induction:   PONV Risk Score and Plan: Propofol infusion  Airway Management Planned:   Additional Equipment:   Intra-op Plan:   Post-operative Plan:   Informed Consent: I have reviewed the patients History and Physical, chart, labs and discussed the procedure including the risks, benefits and alternatives for the proposed anesthesia with the patient or authorized representative who has indicated his/her understanding and acceptance.     Dental Advisory Given  Plan Discussed with: CRNA  Anesthesia Plan Comments:         Anesthesia Quick Evaluation

## 2021-07-17 NOTE — Anesthesia Postprocedure Evaluation (Signed)
Anesthesia Post Note  Patient: Maxwell Henderson  Procedure(s) Performed: ESOPHAGOGASTRODUODENOSCOPY (EGD) WITH PROPOFOL  Patient location during evaluation: PACU Anesthesia Type: General Level of consciousness: awake and alert Pain management: pain level controlled Vital Signs Assessment: post-procedure vital signs reviewed and stable Respiratory status: spontaneous breathing, nonlabored ventilation, respiratory function stable and patient connected to nasal cannula oxygen Cardiovascular status: blood pressure returned to baseline and stable Postop Assessment: no apparent nausea or vomiting Anesthetic complications: no   No notable events documented.   Last Vitals:  Vitals:   07/17/21 1115 07/17/21 1144  BP: 127/89 (!) 141/92  Pulse: 86 90  Resp: 18 18  Temp:  37.1 C  SpO2: 100% 100%    Last Pain:  Vitals:   07/17/21 1144  TempSrc: Oral  PainSc:                  Louann Sjogren

## 2021-07-17 NOTE — Brief Op Note (Signed)
07/16/2021 - 07/17/2021  11:00 AM  PATIENT:  Maxwell Henderson  55 y.o. male  PRE-OPERATIVE DIAGNOSIS:  HEMATEMESIS  POST-OPERATIVE DIAGNOSIS:  * No post-op diagnosis entered *  PROCEDURE:  Procedure(s): ESOPHAGOGASTRODUODENOSCOPY (EGD) WITH PROPOFOL (N/A)  SURGEON:  Surgeon(s) and Role:    * Harvel Quale, MD - Primary  Patient underwent EGD under propofol sedation.  He tolerated the procedure adequately.  Patient was found to have a medium-sized, ulcerating mass was found at the gastroesophageal junction. The mass was non-obstructing. This corresponds to his GE junction malignancy.  There was presence of One extremely large midly oozing and friable cratered gastric ulcer was found in the cardia, extending from the esophageal mass. There was significant amount of clots and debris covering the ulcer but this was cleaned thoroughly and no vessel could be seen. The ulcer had a malignant appearance - this corresponds to tumor bleeding.  Rest of the stomach and duodenum were normal.  RECOMMENDATIONS - Return patient to hospital ward for ongoing care.  - Resume previous diet.  - Use Protonix (pantoprazole) 40 mg PO BID indefinitely.  - Use sucralfate suspension 1 gram PO QID.  - Palliative care evaluation given poor prognosis. - No role for endoscopic intervention for tumor bleeding. - GI service will sign-off, please call us back if you have any more questions.  [name], MD Gastroenterology and Hepatology [facility]

## 2021-07-18 LAB — HEMOGLOBIN A1C
Hgb A1c MFr Bld: 5.6 % (ref 4.8–5.6)
Mean Plasma Glucose: 114.02 mg/dL

## 2021-07-18 NOTE — Op Note (Signed)
[facility] Patient Name: Maxwell Henderson Procedure Date: 07/17/2021 10:41 AM MRN: [id number] Date of Birth: 07/30/1966 Attending MD: [name] ,  CSN: [id number] Age: 55 Admit Type: Inpatient Procedure:                Upper GI endoscopy Indications:              Hematemesis, Malignant esophagogastric junction                            adenocarcinoma Providers:                [name], Lurline Del, RN, Randa Spike,                            Technician Referring MD:              Medicines:                Monitored Anesthesia Care Complications:            No immediate complications. Estimated Blood Loss:     Estimated blood loss: none. Procedure:                Pre-Anesthesia Assessment:                           - Prior to the procedure, a History and Physical                            was performed, and patient medications, allergies                            and sensitivities were reviewed. The patient's                            tolerance of previous anesthesia was reviewed.                           - The risks and benefits of the procedure and the                            sedation options and risks were discussed with the                            patient. All questions were answered and informed                            consent was obtained.                           - ASA Grade Assessment: III - A patient with severe                            systemic disease.                           After obtaining informed consent, the endoscope was  passed under direct vision. Throughout the                            procedure, the patient's blood pressure, pulse, and                            oxygen saturations were monitored continuously. The                            [id number]-H190 ([id number]) scope was introduced through the                            mouth, and advanced to the second part of duodenum.                             The upper GI endoscopy was accomplished without                            difficulty. The patient tolerated the procedure                            well. Scope In: 11:01:29 AM Scope Out: 76:28:31 AM Total Procedure Duration: 0 hours 4 minutes 39 seconds  Findings:      A medium-sized, ulcerating mass was found at the gastroesophageal       junction. The mass was non-obstructing. This corresponds to his GE       junction malignancy.      One extremely large midly oozing and friable cratered gastric ulcer was       found in the cardia, extending from the esophageal mass. There was       significant amount of clots and debris covering the ulcer but this       cleaned thoroughly and no vessel could be seen. The ulcer had a       malignant appearance - this corresponds to tumor bleeding.      The examined duodenum was normal. Impression:               - Malignant esophageal tumor was found at the                            gastroesophageal junction, which corresponds to                            history of GE junction malignancy.                           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                           - Normal examined duodenum.                           - No specimens collected. Moderate Sedation:  Per Anesthesia Care Recommendation:           - Return patient to hospital ward for ongoing care.                           - Resume previous diet.                           - Use Protonix (pantoprazole) 40 mg PO BID                            indefinitely.                           - Use sucralfate suspension 1 gram PO QID.                           - Palliative care evaluation given poor prognosis.                           - No role for endoscopic intervention for tumor                            bleeding. 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 All rights reserved. The codes documented in this report are preliminary and upon coder review may  be revised to meet current compliance requirements. [name], MD [name],  07/17/2021 11:21:27 AM This report has been signed electronically. Number of Addenda: 0

## 2021-07-21 ENCOUNTER — Encounter (HOSPITAL_COMMUNITY): Payer: Self-pay | Admitting: Gastroenterology

## 2021-07-29 HISTORY — PX: ESOPHAGOGASTRODUODENOSCOPY: SHX1529

## 2021-08-09 ENCOUNTER — Inpatient Hospital Stay (HOSPITAL_COMMUNITY)
Admission: EM | Admit: 2021-08-09 | Discharge: 2021-08-14 | DRG: 377 | Disposition: A | Discharge status: Other Acute Inpatient Hospital | Payer: Medicaid Other | Attending: Internal Medicine | Admitting: Internal Medicine

## 2021-08-09 ENCOUNTER — Other Ambulatory Visit: Payer: Self-pay

## 2021-08-09 ENCOUNTER — Encounter (HOSPITAL_COMMUNITY): Payer: Self-pay | Admitting: Family Medicine

## 2021-08-09 ENCOUNTER — Emergency Department (HOSPITAL_COMMUNITY): Payer: Medicaid Other

## 2021-08-09 DIAGNOSIS — Z20822 Contact with and (suspected) exposure to covid-19: Secondary | ICD-10-CM | POA: Diagnosis present

## 2021-08-09 DIAGNOSIS — Z6824 Body mass index (BMI) 24.0-24.9, adult: Secondary | ICD-10-CM

## 2021-08-09 DIAGNOSIS — G893 Neoplasm related pain (acute) (chronic): Secondary | ICD-10-CM | POA: Diagnosis present

## 2021-08-09 DIAGNOSIS — C169 Malignant neoplasm of stomach, unspecified: Secondary | ICD-10-CM | POA: Diagnosis present

## 2021-08-09 DIAGNOSIS — F1120 Opioid dependence, uncomplicated: Secondary | ICD-10-CM | POA: Diagnosis present

## 2021-08-09 DIAGNOSIS — Z7189 Other specified counseling: Secondary | ICD-10-CM | POA: Diagnosis not present

## 2021-08-09 DIAGNOSIS — E119 Type 2 diabetes mellitus without complications: Secondary | ICD-10-CM

## 2021-08-09 DIAGNOSIS — Z79899 Other long term (current) drug therapy: Secondary | ICD-10-CM

## 2021-08-09 DIAGNOSIS — R578 Other shock: Secondary | ICD-10-CM

## 2021-08-09 DIAGNOSIS — K920 Hematemesis: Secondary | ICD-10-CM | POA: Diagnosis present

## 2021-08-09 DIAGNOSIS — D62 Acute posthemorrhagic anemia: Secondary | ICD-10-CM | POA: Diagnosis present

## 2021-08-09 DIAGNOSIS — Z23 Encounter for immunization: Secondary | ICD-10-CM | POA: Diagnosis not present

## 2021-08-09 DIAGNOSIS — G620 Drug-induced polyneuropathy: Secondary | ICD-10-CM | POA: Diagnosis present

## 2021-08-09 DIAGNOSIS — Z8501 Personal history of malignant neoplasm of esophagus: Secondary | ICD-10-CM

## 2021-08-09 DIAGNOSIS — R112 Nausea with vomiting, unspecified: Secondary | ICD-10-CM | POA: Diagnosis present

## 2021-08-09 DIAGNOSIS — D500 Iron deficiency anemia secondary to blood loss (chronic): Secondary | ICD-10-CM

## 2021-08-09 DIAGNOSIS — Z931 Gastrostomy status: Secondary | ICD-10-CM | POA: Diagnosis not present

## 2021-08-09 DIAGNOSIS — E43 Unspecified severe protein-calorie malnutrition: Secondary | ICD-10-CM | POA: Insufficient documentation

## 2021-08-09 DIAGNOSIS — R1310 Dysphagia, unspecified: Secondary | ICD-10-CM | POA: Diagnosis present

## 2021-08-09 DIAGNOSIS — K254 Chronic or unspecified gastric ulcer with hemorrhage: Secondary | ICD-10-CM | POA: Diagnosis present

## 2021-08-09 DIAGNOSIS — E440 Moderate protein-calorie malnutrition: Secondary | ICD-10-CM | POA: Diagnosis present

## 2021-08-09 DIAGNOSIS — T451X5A Adverse effect of antineoplastic and immunosuppressive drugs, initial encounter: Secondary | ICD-10-CM | POA: Diagnosis present

## 2021-08-09 DIAGNOSIS — K5900 Constipation, unspecified: Secondary | ICD-10-CM | POA: Diagnosis present

## 2021-08-09 DIAGNOSIS — I959 Hypotension, unspecified: Secondary | ICD-10-CM | POA: Diagnosis present

## 2021-08-09 DIAGNOSIS — I10 Essential (primary) hypertension: Secondary | ICD-10-CM

## 2021-08-09 DIAGNOSIS — I9589 Other hypotension: Secondary | ICD-10-CM | POA: Diagnosis not present

## 2021-08-09 DIAGNOSIS — E1140 Type 2 diabetes mellitus with diabetic neuropathy, unspecified: Secondary | ICD-10-CM | POA: Diagnosis present

## 2021-08-09 DIAGNOSIS — R000 Tachycardia, unspecified: Secondary | ICD-10-CM

## 2021-08-09 DIAGNOSIS — Z885 Allergy status to narcotic agent status: Secondary | ICD-10-CM | POA: Diagnosis not present

## 2021-08-09 DIAGNOSIS — R739 Hyperglycemia, unspecified: Secondary | ICD-10-CM | POA: Diagnosis not present

## 2021-08-09 DIAGNOSIS — Z515 Encounter for palliative care: Secondary | ICD-10-CM | POA: Diagnosis not present

## 2021-08-09 DIAGNOSIS — Z923 Personal history of irradiation: Secondary | ICD-10-CM | POA: Diagnosis not present

## 2021-08-09 DIAGNOSIS — K922 Gastrointestinal hemorrhage, unspecified: Secondary | ICD-10-CM | POA: Diagnosis not present

## 2021-08-09 DIAGNOSIS — E861 Hypovolemia: Secondary | ICD-10-CM | POA: Diagnosis not present

## 2021-08-09 DIAGNOSIS — C159 Malignant neoplasm of esophagus, unspecified: Secondary | ICD-10-CM | POA: Diagnosis present

## 2021-08-09 DIAGNOSIS — K219 Gastro-esophageal reflux disease without esophagitis: Secondary | ICD-10-CM

## 2021-08-09 DIAGNOSIS — E1165 Type 2 diabetes mellitus with hyperglycemia: Secondary | ICD-10-CM | POA: Diagnosis present

## 2021-08-09 DIAGNOSIS — Z87898 Personal history of other specified conditions: Secondary | ICD-10-CM

## 2021-08-09 DIAGNOSIS — Z87891 Personal history of nicotine dependence: Secondary | ICD-10-CM

## 2021-08-09 LAB — CBC WITH DIFFERENTIAL/PLATELET
Abs Immature Granulocytes: 0.1 10*3/uL — ABNORMAL HIGH (ref 0.00–0.07)
Basophils Absolute: 0 10*3/uL (ref 0.0–0.1)
Basophils Relative: 0 %
Eosinophils Absolute: 0.1 10*3/uL (ref 0.0–0.5)
Eosinophils Relative: 1 %
HCT: 18.7 % — ABNORMAL LOW (ref 39.0–52.0)
Hemoglobin: 5.6 g/dL — CL (ref 13.0–17.0)
Immature Granulocytes: 1 %
Lymphocytes Relative: 19 %
Lymphs Abs: 2 10*3/uL (ref 0.7–4.0)
MCH: 19 pg — ABNORMAL LOW (ref 26.0–34.0)
MCHC: 29.9 g/dL — ABNORMAL LOW (ref 30.0–36.0)
MCV: 63.6 fL — ABNORMAL LOW (ref 80.0–100.0)
Monocytes Absolute: 0.8 10*3/uL (ref 0.1–1.0)
Monocytes Relative: 8 %
Neutro Abs: 7.3 10*3/uL (ref 1.7–7.7)
Neutrophils Relative %: 71 %
Platelets: 331 10*3/uL (ref 150–400)
RBC: 2.94 MIL/uL — ABNORMAL LOW (ref 4.22–5.81)
RDW: 24.4 % — ABNORMAL HIGH (ref 11.5–15.5)
WBC: 10.3 10*3/uL (ref 4.0–10.5)
nRBC: 0 % (ref 0.0–0.2)

## 2021-08-09 LAB — COMPREHENSIVE METABOLIC PANEL
ALT: 13 U/L (ref 0–44)
AST: 18 U/L (ref 15–41)
Albumin: 2.5 g/dL — ABNORMAL LOW (ref 3.5–5.0)
Alkaline Phosphatase: 45 U/L (ref 38–126)
Anion gap: 10 (ref 5–15)
BUN: 17 mg/dL (ref 6–20)
CO2: 19 mmol/L — ABNORMAL LOW (ref 22–32)
Calcium: 7.2 mg/dL — ABNORMAL LOW (ref 8.9–10.3)
Chloride: 107 mmol/L (ref 98–111)
Creatinine, Ser: 1.04 mg/dL (ref 0.61–1.24)
GFR, Estimated: >60 mL/min (ref >60)
Glucose, Bld: 257 mg/dL — ABNORMAL HIGH (ref 70–99)
Potassium: 4.1 mmol/L (ref 3.5–5.1)
Sodium: 136 mmol/L (ref 135–145)
Total Bilirubin: 0.4 mg/dL (ref 0.3–1.2)
Total Protein: 4.9 g/dL — ABNORMAL LOW (ref 6.5–8.1)

## 2021-08-09 LAB — RESP PANEL BY RT-PCR (FLU A&B, COVID) ARPGX2
Influenza A by PCR: NEGATIVE
Influenza B by PCR: NEGATIVE
SARS Coronavirus 2 by RT PCR: NEGATIVE

## 2021-08-09 LAB — HEMOGLOBIN AND HEMATOCRIT, BLOOD
HCT: 23.3 % — ABNORMAL LOW (ref 39.0–52.0)
Hemoglobin: 7.5 g/dL — ABNORMAL LOW (ref 13.0–17.0)

## 2021-08-09 LAB — GLUCOSE, CAPILLARY: Glucose-Capillary: 95 mg/dL (ref 70–99)

## 2021-08-09 LAB — PREPARE RBC (CROSSMATCH)

## 2021-08-09 LAB — MRSA NEXT GEN BY PCR, NASAL: MRSA by PCR Next Gen: NOT DETECTED

## 2021-08-09 MED ORDER — SODIUM CHLORIDE 0.9% IV SOLUTION: Freq: Once | INTRAVENOUS | Status: DC

## 2021-08-09 MED ORDER — HYDROMORPHONE HCL 1 MG/ML IJ SOLN: 0.5000 mg | INTRAMUSCULAR | Status: DC | PRN

## 2021-08-09 MED ORDER — SODIUM CHLORIDE 0.9 % IV SOLN
10.0000 mL/h | Freq: Once | INTRAVENOUS | Status: AC
  Administered 2021-08-09: 08:00:00 10 mL/h via INTRAVENOUS

## 2021-08-09 MED ORDER — PANTOPRAZOLE SODIUM 40 MG IV SOLR
40.0000 mg | Freq: Two times a day (BID) | INTRAVENOUS | Status: DC
  Administered 2021-08-12 – 2021-08-14 (×5): 40 mg via INTRAVENOUS
  Filled 2021-08-09 (×5): qty 40

## 2021-08-09 MED ORDER — PANTOPRAZOLE 80MG IVPB - SIMPLE MED
80.0000 mg | Freq: Once | INTRAVENOUS | Status: AC
  Administered 2021-08-09: 09:00:00 80 mg via INTRAVENOUS
  Filled 2021-08-09: qty 100

## 2021-08-09 MED ORDER — ACETAMINOPHEN 650 MG RE SUPP: 650.0000 mg | Freq: Four times a day (QID) | RECTAL | Status: DC | PRN

## 2021-08-09 MED ORDER — SODIUM CHLORIDE 0.9 % IV SOLN: INTRAVENOUS | Status: DC

## 2021-08-09 MED ORDER — ONDANSETRON HCL 4 MG/2ML IJ SOLN
4.0000 mg | Freq: Once | INTRAMUSCULAR | Status: AC
  Administered 2021-08-09: 09:00:00 4 mg via INTRAVENOUS
  Filled 2021-08-09: qty 2

## 2021-08-09 MED ORDER — METHADONE HCL 5 MG PO TABS: 2.5000 mg | ORAL_TABLET | Freq: Three times a day (TID) | ORAL | Status: DC

## 2021-08-09 MED ORDER — INSULIN ASPART 100 UNIT/ML IJ SOLN
0.0000 [IU] | Freq: Four times a day (QID) | INTRAMUSCULAR | Status: DC
Start: 2021-08-09 — End: 2021-08-13
  Administered 2021-08-12 (×2): 1 [IU] via SUBCUTANEOUS

## 2021-08-09 MED ORDER — ONDANSETRON HCL 4 MG/2ML IJ SOLN
4.0000 mg | Freq: Once | INTRAMUSCULAR | Status: AC
  Administered 2021-08-09: 11:00:00 4 mg via INTRAVENOUS
  Filled 2021-08-09: qty 2

## 2021-08-09 MED ORDER — GABAPENTIN 300 MG PO CAPS: 300.0000 mg | ORAL_CAPSULE | Freq: Three times a day (TID) | ORAL | Status: DC | PRN

## 2021-08-09 MED ORDER — ALBUTEROL SULFATE (2.5 MG/3ML) 0.083% IN NEBU: 2.5000 mg | INHALATION_SOLUTION | RESPIRATORY_TRACT | Status: DC | PRN

## 2021-08-09 MED ORDER — LACTULOSE 10 GM/15ML PO SOLN
20.0000 g | Freq: Two times a day (BID) | ORAL | Status: DC | PRN
  Administered 2021-08-14: 20 g via ORAL
  Filled 2021-08-09: qty 30

## 2021-08-09 MED ORDER — SODIUM CHLORIDE 0.9 % IV BOLUS
1000.0000 mL | Freq: Once | INTRAVENOUS | Status: AC
  Administered 2021-08-09: 08:00:00 1000 mL via INTRAVENOUS

## 2021-08-09 MED ORDER — FENTANYL CITRATE PF 50 MCG/ML IJ SOSY
100.0000 ug | PREFILLED_SYRINGE | Freq: Once | INTRAMUSCULAR | Status: AC
  Administered 2021-08-09: 11:00:00 100 ug via INTRAVENOUS
  Filled 2021-08-09: qty 2

## 2021-08-09 MED ORDER — ACETAMINOPHEN 325 MG PO TABS
650.0000 mg | ORAL_TABLET | Freq: Four times a day (QID) | ORAL | Status: DC | PRN
  Administered 2021-08-09 – 2021-08-14 (×10): 650 mg via ORAL
  Filled 2021-08-09 (×11): qty 2

## 2021-08-09 MED ORDER — ONDANSETRON HCL 4 MG/2ML IJ SOLN: 4.0000 mg | Freq: Four times a day (QID) | INTRAMUSCULAR | Status: DC | PRN

## 2021-08-09 MED ORDER — ONDANSETRON HCL 4 MG PO TABS: 4.0000 mg | ORAL_TABLET | Freq: Four times a day (QID) | ORAL | Status: DC | PRN

## 2021-08-09 MED ORDER — PANTOPRAZOLE INFUSION (NEW) - SIMPLE MED
8.0000 mg/h | INTRAVENOUS | Status: AC
  Administered 2021-08-09 – 2021-08-11 (×4): 8 mg/h via INTRAVENOUS
  Filled 2021-08-09: qty 80
  Filled 2021-08-09: qty 100
  Filled 2021-08-09: qty 80
  Filled 2021-08-09 (×3): qty 100
  Filled 2021-08-09: qty 80
  Filled 2021-08-09: qty 100

## 2021-08-09 NOTE — ED Notes (Signed)
Emergency blood released and transfused. Verified by Maegan RN and Marquita Palms.

## 2021-08-09 NOTE — H&P (Signed)
History and Physical  [place] QHU:[id number] DOB: 1965/09/28 DOA: 08/09/2021  PCP: [name], FNP  Patient coming from: Home by EMS  Level of care: ICU  I have personally briefly reviewed patient's old medical records in [place]  Chief Complaint: hypotensive, hematemesis, hemorrhagic shock   HPI: Maxwell Henderson is a 55 y.o. 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  He went on to receive chemotherapy around 07/21/2021 and reports that he had been doing fairly well.  He was having intermittent constipation and was taking prune juice.  He reports that he had been eating normal foods which he was told he could do after his last EGD.  He reports that he had a melanotic stool yesterday.  He started to feel ill at about 4 AM where he became diaphoretic and very weak.  He reports that he woke up this morning feeling chills and having a cramping epigastric abdominal pain.  It was associated with nausea and he vomited a moderate amount of bright red blood.  He felt unwell.  EMS was called to the house.  ED Course: EMS found him in hemorrhagic shock, severely hypotensive with blood pressure 76/40, heart rate was 110 CBG was 268, they bolused him 1 L of normal saline and brought him to the emergency department.  His hemoglobin was noted to be down to 5.6 from 8.5 on 07/17/2021.  He was ordered to units emergency PRBC transfusion which improved his  blood pressure and an additional 2 units PRBC ordered.  He reports that he is vaccinated fully for COVID but has not received an influenza vaccine.  He reports that he did have a case of influenza A this season that he recovered from.  His SARS 2 coronavirus test and influenza testing was negative.  His chest x-ray showed no active disease.  After being resuscitated in the emergency department.   Review of Systems: Review of Systems  Constitutional:  Positive for chills, fever and malaise/fatigue.  HENT: Negative.    Eyes: Negative.   Respiratory: Negative.    Cardiovascular: Negative.   Gastrointestinal:  Positive for abdominal pain, constipation, melena, nausea and vomiting.  Genitourinary: Negative.        Melena  Musculoskeletal: Negative.   Skin: Negative.   Neurological: Negative.   Endo/Heme/Allergies: Negative.   Psychiatric/Behavioral: Negative.    All other systems reviewed and are negative.   Past Medical History:  Diagnosis Date   Diabetes mellitus without complication ([place])    Hypertension    Iron deficiency anemia due to chronic blood loss 02/28/2017   Primary esophageal adenocarcinoma ([place]) 01/29/2017    Past Surgical History:  Procedure Laterality Date   ESOPHAGOGASTRODUODENOSCOPY (EGD) WITH PROPOFOL N/A 01/16/2017   Procedure: ESOPHAGOGASTRODUODENOSCOPY (EGD) WITH PROPOFOL;  Surgeon: [name], MD;  [facility];  Service: Endoscopy;  Laterality: N/A;  730    ESOPHAGOGASTRODUODENOSCOPY (EGD) WITH PROPOFOL N/A 11/01/2020   Procedure: ESOPHAGOGASTRODUODENOSCOPY (EGD) WITH PROPOFOL;  Surgeon: Daneil Dolin, MD;  Location: [facility];  Service: Endoscopy;  Laterality: N/A;   ESOPHAGOGASTRODUODENOSCOPY (EGD) WITH PROPOFOL N/A 07/17/2021   Procedure: ESOPHAGOGASTRODUODENOSCOPY (EGD) WITH PROPOFOL;  Surgeon: Harvel Quale, MD;  Location: [facility];  Service: Gastroenterology;  Laterality: N/A;   IR FLUORO GUIDE PORT INSERTION RIGHT   02/13/2017   IR US GUIDE VASC ACCESS RIGHT  02/13/2017   lipoma removal     PORTA CATH INSERTION Right 02/13/2017   SAVORY DILATION N/A 01/16/2017   Procedure: SAVORY DILATION;  Surgeon: [name], MD;  Location: [facility];  Service: Endoscopy;  Laterality: N/A;     reports that he has quit smoking. His smoking use included cigarettes. He smoked an average of .25 packs per day. He has never used smokeless tobacco. He reports current drug use. Drug: Marijuana. He reports that he does not drink alcohol.  Allergies  Allergen Reactions   Ketamine And Related Other (See Comments)    intolerance    Family History  Problem Relation Age of Onset   Prostate cancer Father    Colon cancer Neg Hx    Colon polyps Neg Hx     Prior to Admission medications   Medication Sig Start Date End Date Taking? Authorizing Provider  dicyclomine (BENTYL) 10 MG capsule Take 10 mg by mouth 4 (four) times daily as needed for spasms.  Patient not taking: Reported on 07/16/2021 05/22/19   [provider]  famotidine (PEPCID) 20 MG tablet Take 20 mg by mouth 2 (two) times daily. 05/22/19   [provider]  gabapentin (NEURONTIN) 300 MG capsule Take 300 mg by mouth 3 (three) times daily. 06/25/21   [provider]  Lactulose 20 GM/30ML SOLN Take 30 mLs (20 g total) by mouth 2 (two) times a day. 02/17/19   Derek Jack, MD  methadone (DOLOPHINE) 10 MG tablet Take by mouth See admin instructions. Take 2.5 tablets by mouth 3 times a day 07/15/21   [provider]  oxycodone (ROXICODONE) 30 MG immediate release tablet Take 30 mg by mouth every 4 (four) hours as needed for pain. 07/15/21   [provider]  pantoprazole (PROTONIX) 40 MG tablet Take 1 tablet (40 mg total) by mouth 2 (two) times daily. 07/17/21 10/15/21  Manuella Ghazi, Pratik D, DO  sucralfate (CARAFATE) 1 g tablet Take 1 tablet (1 g total) by mouth 4 (four) times daily -  with meals and at bedtime. 07/17/21 08/16/21  Manuella Ghazi,  Pratik D, DO  thiamine 100 MG tablet Take 1 tablet by mouth daily. Patient not taking: Reported on 07/16/2021 02/13/19   [provider]    Physical Exam: Vitals:   08/09/21 0930 08/09/21 0945 08/09/21 1000 08/09/21 1015  BP: 109/74 106/68 109/79 106/79  Pulse: (!) 108 (!) 110 (!) 109 (!) 110  Resp: 18 18 16 20   Temp:      TempSrc:      SpO2: 100% 100% 100% 100%  Weight:      Height:       Constitutional: NAD, calm, comfortable, awake, alert, appears to be in pain Eyes: PERRL, lids and conjunctivae normal ENMT: Mucous membranes are pale and dry. Posterior pharynx clear of any exudate or lesions. Normal dentition.  Neck: normal, supple, no masses, no thyromegaly Respiratory: clear to auscultation bilaterally, no wheezing, no crackles. Normal respiratory effort. No accessory muscle use.  Cardiovascular: normal s1, s2 sounds, no murmurs / rubs / gallops. No extremity edema. 2+ pedal pulses. No carotid bruits.  Abdomen: guarding, epigastric tenderness LUQ tenderness with  light palpation only. No hepatosplenomegaly. Bowel sounds positive.  Musculoskeletal: no clubbing / cyanosis. No joint deformity upper and lower extremities. Good ROM, no contractures. Normal muscle tone.  Skin: no rashes, lesions, ulcers. No induration Neurologic: CN 2-12 grossly intact. Sensation intact, DTR normal. Strength 5/5 in all 4.  Psychiatric: Normal judgment and insight. Alert and oriented x 3. Normal mood.   Labs on Admission: I have personally reviewed following labs and imaging studies  CBC: Recent Labs  Lab 08/09/21 0756  WBC 10.3  NEUTROABS 7.3  HGB 5.6*  HCT 18.7*  MCV 63.6*  PLT 768   Basic Metabolic Panel: Recent Labs  Lab 08/09/21 0756  NA 136  K 4.1  CL 107  CO2 19*  GLUCOSE 257*  BUN 17  CREATININE 1.04  CALCIUM 7.2*   GFR: Estimated Creatinine Clearance: 91.8 mL/min (by C-G formula based on SCr of 1.04 mg/dL). Liver Function Tests: Recent Labs  Lab 08/09/21 0756   AST 18  ALT 13  ALKPHOS 45  BILITOT 0.4  PROT 4.9*  ALBUMIN 2.5*   No results for input(s): LIPASE, AMYLASE in the last 168 hours. No results for input(s): AMMONIA in the last 168 hours. Coagulation Profile: No results for input(s): INR, PROTIME in the last 168 hours. Cardiac Enzymes: No results for input(s): CKTOTAL, CKMB, CKMBINDEX, TROPONINI in the last 168 hours. BNP (last 3 results) No results for input(s): PROBNP in the last 8760 hours. HbA1C: No results for input(s): HGBA1C in the last 72 hours. CBG: No results for input(s): GLUCAP in the last 168 hours. Lipid Profile: No results for input(s): CHOL, HDL, LDLCALC, TRIG, CHOLHDL, LDLDIRECT in the last 72 hours. Thyroid Function Tests: No results for input(s): TSH, T4TOTAL, FREET4, T3FREE, THYROIDAB in the last 72 hours. Anemia Panel: No results for input(s): VITAMINB12, FOLATE, FERRITIN, TIBC, IRON, RETICCTPCT in the last 72 hours. Urine analysis:    Component Value Date/Time   COLORURINE YELLOW 05/03/2019 0325   APPEARANCEUR CLOUDY (A) 05/03/2019 0325   LABSPEC 1.039 (H) 05/03/2019 0325   PHURINE 7.0 05/03/2019 0325   GLUCOSEU NEGATIVE 05/03/2019 0325   HGBUR NEGATIVE 05/03/2019 0325   BILIRUBINUR NEGATIVE 05/03/2019 0325   KETONESUR NEGATIVE 05/03/2019 0325   PROTEINUR NEGATIVE 05/03/2019 0325   NITRITE NEGATIVE 05/03/2019 0325   LEUKOCYTESUR NEGATIVE 05/03/2019 0325    Radiological Exams on Admission: DG Chest Port 1 View  Result Date: 08/09/2021 CLINICAL DATA:  Hematemesis.  History of esophageal cancer. EXAM: PORTABLE CHEST 1 VIEW COMPARISON:  Chest x-ray dated June 07, 2019. FINDINGS: Unchanged right chest wall port catheter. The heart size and mediastinal contours are within normal limits. Normal pulmonary vascularity. No focal consolidation, pleural effusion, or pneumothorax. No acute osseous abnormality. IMPRESSION: No active disease. Electronically Signed   By: Titus Dubin M.D.   On: 08/09/2021  08:15    Assessment/Plan Principal Problem:   Hematemesis Active Problems:   Acute blood loss anemia   Hemorrhagic shock (HCC)   Dysphagia   Primary esophageal adenocarcinoma (HCC)   Iron deficiency anemia due to chronic blood loss   Type 2 diabetes mellitus without complication, without long-term current use of insulin (HCC)   Essential hypertension   GERD (gastroesophageal reflux disease)   Hypotension   Hyperglycemia   Former smoker   Former consumption of alcohol   Sinus tachycardia   Nausea and vomiting   Acute hemorrhagic shock  - secondary to hematemesis - improved after emergent transfusion of 2 units PRBC - an addition 2 units  PRBC ordered - BPs have improved  - continue IV hydration   Hematemesis  - secondary to esophageal mass and gastric ulceration - appreciate GI consultation  - continue IV protonix infusion  - NPO for now - resuscitation as noted above   Acute blood loss anemia  - transfusing 4 units PRBC  - recheck CBC in AM   Hypotension  - improved with resuscitation efforts - follow closely in ICU  - holding antihypertensives  Type 2 DM with hyperglycemia  - SSI coverage ordered Q6h  (sensitive)   GERD - protonix infusion ordered  Nausea and vomiting  - Zofran and Protonix ordered for symptoms  Chronic abdominal pain / Opioid dependence  - resume home methadone  - IV dilaudid ordered for breakthru pain and discomfort   Sinus tachycardia  - secondary to hemorrhagic shock  - continue supportive measures  - follow in ICU  DVT prophylaxis: SCDs   Code Status: Full   Family Communication: patient updated   Disposition Plan: home   Consults called: GI   Admission status: INP   Critical Care Procedure Note Authorized and Performed by: Murvin Natal MD  Total Critical Care time:  50 mins  Due to a high probability of clinically significant, life threatening deterioration, the patient required my highest level of preparedness to intervene  emergently and I personally spent this critical care time directly and personally managing the patient.  This critical care time included obtaining a history; examining the patient, pulse oximetry; ordering and review of studies; arranging urgent treatment with development of a management plan; evaluation of patient's response of treatment; frequent reassessment; and discussions with other providers.  This critical care time was performed to assess and manage the high probability of imminent and life threatening deterioration that could result in multi-organ failure.  It was exclusive of separately billable procedures and treating other patients and teaching time.    Level of care: ICU Irwin Brakeman MD [facility] How to contact the [facility] Attending or Consulting provider Bartholomew or covering provider during after hours [employer], for this patient?  Check the care team in [facility] and look for a) attending/consulting [facility] provider listed and b) the [facility] team listed Log into [email or web address] and use [place]'s universal password to access. If you do not have the password, please contact the hospital operator. Locate the [facility] provider you are looking for under [facility] and page to a number that you can be directly reached. If you still have difficulty reaching the provider, please page the [facility] (Director on Call) for the Hospitalists listed on amion for assistance.   If 7PM-7AM, please contact night-coverage [email or web address] Password [facility]  08/09/2021, 10:33 AM

## 2021-08-09 NOTE — ED Triage Notes (Signed)
Pt BIB EMS, found hypotensive and was vomiting blood approx 200 ml measured. Was sitting on the side of bed, diaphoretic, SOB, chest pain. H/O Chemo 4 weeks ago, Recent flu, Stage IV Esophageal CA.  76/40--> SBP 94 HR= 110  CBG 268 NS 1000cc

## 2021-08-09 NOTE — ED Notes (Signed)
Pt's wife updated, consent given by patient.

## 2021-08-09 NOTE — Consult Note (Signed)
Consultation Note Date: 08/09/2021   Patient Name: Maxwell Henderson  DOB: 08-12-1966  MRN: [id number]  Age / Sex: 55 y.o., male  PCP: House, Deliah Goody, FNP Referring Physician: Murlean Iba, MD  Reason for Consultation: Establishing goals of care  HPI/Patient Profile: 55 y.o. 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   Clinical Assessment and Goals of Care: I have reviewed medical records including EPIC notes, labs and imaging, received report from RN, assessed the patient.  Consult today in conjunction with NP [place]/GI service.  [name] Henderson is lying quietly on stretcher in the ED. he greets me, making and somewhat keeping eye contact.  He appears acutely ill, but well-developed.  He is alert and oriented, able to make his basic needs known.  Initially, there is no family at bedside.  During our conversation his wife, [name], arrives.  We meet to discuss diagnosis prognosis, GOC, EOL wishes, disposition and options.  I introduced Palliative Medicine as specialized medical care for people living with serious illness. It focuses on providing relief from the symptoms and stress of a serious illness. The goal is to improve quality of life for both the patient and the family.  We discussed a brief life review of the patient.  He tells me that he and [name] have been together for 40 years.  They are married.  They have a daughter aged 20 and twin sons aged 37.  He tells Maxwell Henderson that he has a 27-year-old grandchild also.     We then focused on their current illness.  At this point recommendation is for transfusion of packed red blood cells.  Monitoring hemoglobin.  GI is not planning for invasive testing at this point.  Further testing the natural disease trajectory and expectations at EOL were discussed.  I attempted to elicit values and goals of care important to the patient.  [name] Henderson states that he wants to continue to be a support and encouragement for his family.  He talks about being strong for his family, but also describes how he feels this responsibility very acutely.  Advanced directives, concepts specific to code status, artifical feeding and hydration, and rehospitalization were considered and discussed.  [name] Henderson talks about his faith, at this point he is unable to set limits on life support.  He shares that he has peace, when his time comes to past he is ready, but he wants to continue to live as long as possible.  Discussed the importance of continued conversation with patient and the medical providers regarding overall plan of care and treatment options, ensuring decisions are within the context of the patients values and GOCs. Questions and concerns were addressed.  The patient  was encouraged to call with questions or concerns.  PMT will continue to support holistically.  Conference with attending, bedside nursing staff, transition of care team related to patient condition, needs, goals of care, disposition.   HCPOA NEXT OF KIN -wife, [name].  States they have been together for 40 years.  They have an adult daughter age 53 and twin sons aged 78.    SUMMARY OF RECOMMENDATIONS   At this point continue to treat the treatable Full scope/full code Time for outcomes   Code Status/Advance Care Planning: Full code -[name] Henderson speaks of his faith.  He tells Maxwell Henderson frequently about his faith in God.  At this point, he is unable to set any limits on life support.  Symptom Management:  Per hospitalist,  no additional needs at this time.  Palliative Prophylaxis:  Frequent Pain Assessment and Oral Care  Additional Recommendations (Limitations, Scope, Preferences): Full Scope Treatment  Psycho-social/Spiritual:  Desire for further Chaplaincy support:yes Additional Recommendations: Caregiving  Support/Resources  Prognosis:  Unable to determine, based on outcomes.  Continues to receive palliative chemotherapy.  Discharge Planning:  To be determined, based on outcomes.  Anticipate home, unsure if would need home health services.       Primary Diagnoses: Present on Admission:  Hematemesis  Acute blood loss anemia  Primary esophageal adenocarcinoma (HCC)  Iron deficiency anemia due to chronic blood loss  Essential hypertension  GERD (gastroesophageal reflux disease)  Hypotension  Hemorrhagic shock (HCC)  Hyperglycemia  Sinus tachycardia  Nausea and vomiting   I have reviewed the medical record, interviewed the patient and family, and examined the patient. The following aspects are pertinent.  Past Medical History:  Diagnosis Date   Diabetes mellitus without complication (HCC)    Hypertension    Iron deficiency anemia due to chronic blood loss 02/28/2017   Primary esophageal adenocarcinoma ([place]) 01/29/2017   Social History   Socioeconomic History   Marital status: Married    Spouse name: Not on file   Number of children: Not on file   Years of education: Not on file   Highest education level: Not on file  Occupational History   Not on file  Tobacco Use   Smoking status: Former    Packs/day: 0.25    Types: Cigarettes   Smokeless tobacco: Never  Vaping Use   Vaping Use: Never used  Substance and Sexual Activity   Alcohol use: No    Comment: hx heavy alcohol, hasn't drank in 22 yrs   Drug use: Yes    Types: Marijuana   Sexual activity: Yes  Other Topics Concern   Not on file  Social History Narrative   WORKING [employer] AND CUTTING [employer].   Social  Determinants of Health   Financial Resource Strain: Not on file  Food Insecurity: Not on file  Transportation Needs: Not on file  Physical Activity: Not on file  Stress: Not on file  Social Connections: Not on file   Family History  Problem Relation Age of Onset   Prostate cancer Father    Colon cancer Neg Hx    Colon polyps Neg Hx    Scheduled Meds:  insulin aspart  0-9 Units Subcutaneous Q6H   methadone  2.5 mg Oral Q8H   [START ON 08/12/2021] pantoprazole  40 mg Intravenous Q12H   Continuous Infusions:  sodium chloride 100 mL/hr at 08/09/21 1127   pantoprazole 8 mg/hr (08/09/21 0919)   PRN Meds:.acetaminophen **OR** acetaminophen, albuterol, gabapentin, HYDROmorphone (DILAUDID) injection, lactulose, ondansetron **OR** ondansetron (  ZOFRAN) IV Medications Prior to Admission:  Prior to Admission medications   Medication Sig Start Date End Date Taking? Authorizing Provider  famotidine (PEPCID) 20 MG tablet Take 20 mg by mouth 2 (two) times daily. 05/22/19  Yes [provider]  gabapentin (NEURONTIN) 300 MG capsule Take 300 mg by mouth 3 (three) times daily. 06/25/21  Yes [provider]  Lactulose 20 GM/30ML SOLN Take 30 mLs (20 g total) by mouth 2 (two) times a day. 02/17/19  Yes Derek Jack, MD  methadone (DOLOPHINE) 10 MG tablet Take by mouth See admin instructions. Take 2.5 tablets by mouth 3 times a day 07/15/21  Yes [provider]  oxycodone (ROXICODONE) 30 MG immediate release tablet Take 30 mg by mouth every 4 (four) hours as needed for pain. 07/15/21  Yes [provider]  pantoprazole (PROTONIX) 40 MG tablet Take 1 tablet (40 mg total) by mouth 2 (two) times daily. 07/17/21 10/15/21 Yes Shah, Pratik D, DO  sucralfate (CARAFATE) 1 g tablet Take 1 tablet (1 g total) by mouth 4 (four) times daily -  with meals and at bedtime. 07/17/21 08/16/21 Yes Shah, Pratik D, DO   Allergies  Allergen Reactions   Ketamine And Related Other (See  Comments)    intolerance   Review of Systems  Unable to perform ROS: Other   Physical Exam Vitals and nursing note reviewed.  Constitutional:      Appearance: Normal appearance.  HENT:     Mouth/Throat:     Mouth: Mucous membranes are moist.  Cardiovascular:     Rate and Rhythm: Tachycardia present.  Pulmonary:     Effort: Pulmonary effort is normal. No respiratory distress.  Abdominal:     Palpations: Abdomen is soft.     Tenderness: There is abdominal tenderness.  Musculoskeletal:        General: No swelling.  Skin:    General: Skin is warm and dry.  Neurological:     Mental Status: He is alert and oriented to person, place, and time.  Psychiatric:        Mood and Affect: Mood normal.        Behavior: Behavior normal.    Vital Signs: BP 94/62    Pulse (!) 112    Temp 98.2 F (36.8 C) (Oral)    Resp 19    Ht 6\' 1"  (1.854 m)    Wt 83 kg    SpO2 100%    BMI 24.14 kg/m      Pain Score: 3    SpO2: SpO2: 100 % O2 Device:SpO2: 100 % O2 Flow Rate: .   IO: Intake/output summary:  Intake/Output Summary (Last 24 hours) at 08/09/2021 1455 Last data filed at 08/09/2021 1300 Gross per 24 hour  Intake 1099 ml  Output 400 ml  Net 699 ml    LBM: Last BM Date: 08/09/21 Baseline Weight: Weight: 86.6 kg Most recent weight: Weight: 83 kg     Palliative Assessment/Data:   Flowsheet Rows    Flowsheet Row Most Recent Value  Intake Tab   Referral Department Hospitalist  Unit at Time of Referral ICU  Palliative Care Primary Diagnosis Cancer  Date Notified 08/09/21  Palliative Care Type New Palliative care  Reason for referral Clarify Goals of Care  Date of Admission 08/09/21  Date first seen by Palliative Care 08/09/21  # of days Palliative referral response time 0 Day(s)  # of days IP prior to Palliative referral 0  Clinical Assessment   Palliative  Performance Scale Score 60%  Pain Max last 24 hours Not able to report  Pain Min Last 24 hours Not able to report   Dyspnea Max Last 24 Hours Not able to report  Dyspnea Min Last 24 hours Not able to report  Psychosocial & Spiritual Assessment   Palliative Care Outcomes        Time In: 1310 Time Out: 1420 Time Total: 70 minutes  Greater than 50%  of this time was spent counseling and coordinating care related to the above assessment and plan.  Signed by: Drue Novel, NP   Please contact Palliative Medicine Team phone at [phone] for questions and concerns.  For individual provider: See Shea Evans

## 2021-08-09 NOTE — Consult Note (Signed)
Referring Provider: Dr. Jesusita Oka ED  Primary Care Physician:  Edwinna Areola Deliah Goody, FNP Primary Gastroenterologist:  Dr. Truddie Crumble [facility])   Date of Admission: 08/09/21 Date of Consultation: 08/09/21  Reason for Consultation:  Acute hemorrhagic shock, acute blood loss anemia, hematemesis   HPI:  Maxwell Henderson is a 55 y.o. year old male with a history of stage IV metastatic esophageal adenocarcinoma diagnosed in 2018, currently receiving palliative chemotherapy, palliative radiation in the past, presenting with acute hemorrhagic shock, hematemesis, profound anemia. Hgb 5.6 on presentation, receiving emergent transfusion of 2 units PRBCs while in the ED. At time of consultation, he remains tachycardic but hypotension has improved. 2 additional units have been ordered.   Patient notes chemo about 4 weeks ago. He had received iron as outpatient and was feeling well. Notes he has been trying to eat nutritious foods. No NSAIDs. Woke up this morning and saw black stool. Had a cramp in his upper abdomen and then dry heaves with subsequent bloody emesis. Felt extremely weak and fatigued this morning. He notes that he wants to continue treating this as long as he is able to fight it. Palliative NP in room at time of consultation.   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 Malignant-appearing mass in proximal stomach just below GE junction.     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 Malignant-appearing mass in proximal stomach just below GE junction.   ESOPHAGOGASTRODUODENOSCOPY (EGD) WITH PROPOFOL N/A 01/16/2017   Procedure:  ESOPHAGOGASTRODUODENOSCOPY (EGD) WITH PROPOFOL;  Surgeon: [name], MD;  Location: [facility];  Service: Endoscopy;  Laterality: N/A;  730    ESOPHAGOGASTRODUODENOSCOPY (EGD) WITH PROPOFOL N/A 11/01/2020   Procedure: ESOPHAGOGASTRODUODENOSCOPY (EGD) WITH PROPOFOL;  Surgeon: Daneil Dolin, MD;  Location: [facility];  Service: Endoscopy;  Laterality: N/A;   ESOPHAGOGASTRODUODENOSCOPY (EGD) WITH PROPOFOL N/A 07/17/2021   malignant esophagea tumor at GE junction, oozing gastric ulcer felt to be malignant.   IR FLUORO GUIDE PORT INSERTION RIGHT  02/13/2017   IR US GUIDE VASC ACCESS RIGHT  02/13/2017   lipoma removal     PORTA CATH INSERTION Right 02/13/2017   SAVORY DILATION N/A 01/16/2017   Procedure: SAVORY DILATION;  Surgeon: [name], MD;  Location: [facility];  Service: Endoscopy;  Laterality: N/A;    Prior to Admission medications   Medication Sig Start Date End Date Taking? Authorizing Provider  dicyclomine (BENTYL) 10 MG capsule Take 10 mg by mouth 4 (four) times daily as needed for spasms.  Patient not taking: Reported on 07/16/2021 05/22/19   [provider]  famotidine (PEPCID) 20 MG tablet Take 20 mg by mouth 2 (two) times daily. 05/22/19   [provider]  gabapentin (NEURONTIN) 300 MG capsule Take 300 mg by mouth 3 (three) times daily. 06/25/21   [provider]  Lactulose 20 GM/30ML SOLN Take 30 mLs (20 g total) by mouth 2 (two) times a day. 02/17/19   Derek Jack, MD  methadone (DOLOPHINE) 10 MG tablet Take by mouth See admin instructions. Take 2.5 tablets by mouth 3 times a day 07/15/21   [provider]  oxycodone (ROXICODONE) 30 MG immediate release tablet Take 30 mg by mouth every 4 (four) hours as needed for pain. 07/15/21   [provider]  pantoprazole (PROTONIX) 40 MG tablet Take 1 tablet (40 mg total) by mouth 2 (two) times daily. 07/17/21 10/15/21  Manuella Ghazi, Pratik D, DO  sucralfate (CARAFATE) 1 g tablet  Take 1 tablet (1 g total) by mouth 4 (four) times daily -  with meals and at bedtime. 07/17/21 08/16/21  Manuella Ghazi, Pratik D, DO  thiamine 100 MG tablet Take 1 tablet by mouth daily. Patient not taking: Reported on 07/16/2021 02/13/19   [provider]    Current Facility-Administered Medications  Medication Dose Route Frequency Provider Last Rate Last Admin   0.9 %  sodium chloride infusion   Intravenous Continuous Wynetta Emery, Clanford L, MD 100 mL/hr at 08/09/21 1127 Rate Change at 08/09/21 1127   acetaminophen (TYLENOL) tablet 650 mg  650 mg Oral Q6H PRN Irwin Brakeman L, MD   650 mg at 08/09/21 1303   Or   acetaminophen (TYLENOL) suppository 650 mg  650 mg Rectal Q6H PRN Johnson, Clanford L, MD       albuterol (PROVENTIL) (2.5 MG/3ML) 0.083% nebulizer solution 2.5 mg  2.5 mg Nebulization Q2H PRN Johnson, Clanford L, MD       gabapentin (NEURONTIN) capsule 300 mg  300 mg Oral TID PRN Johnson, Clanford L, MD       HYDROmorphone (DILAUDID) injection 0.5-1 mg  0.5-1 mg Intravenous Q2H PRN Johnson, Clanford L, MD       insulin aspart (novoLOG) injection 0-9 Units  0-9 Units Subcutaneous Q6H Johnson, Clanford L, MD       lactulose (CHRONULAC) 10 GM/15ML solution 20 g  20 g Oral BID PRN Johnson, Clanford L, MD       methadone (DOLOPHINE) tablet 2.5 mg  2.5 mg Oral Q8H Johnson, Clanford L, MD       ondansetron (ZOFRAN) tablet 4 mg  4 mg Oral Q6H PRN Johnson, Clanford L, MD       Or   ondansetron (ZOFRAN) injection 4 mg  4 mg Intravenous Q6H PRN Johnson, Clanford L, MD       [START ON 08/12/2021] pantoprazole (PROTONIX) injection 40 mg  40 mg Intravenous Q12H Johnson, Clanford L, MD       pantoprozole (PROTONIX) 80 mg /NS 100 mL infusion  8 mg/hr Intravenous Continuous Johnson, Clanford L, MD 10 mL/hr at 08/09/21 0919 8 mg/hr at 08/09/21 0919    Allergies as of 08/09/2021 - Review Complete 08/09/2021  Allergen Reaction Noted   Ketamine and related Other (See Comments) 05/10/2019    Family  History  Problem Relation Age of Onset   Prostate cancer Father    Colon cancer Neg Hx    Colon polyps Neg Hx     Social History   Socioeconomic History   Marital status: Married    Spouse name: Not on file   Number of children: Not on file   Years of education: Not on file   Highest education level: Not on file  Occupational History   Not on file  Tobacco Use   Smoking status: Former    Packs/day: 0.25    Types: Cigarettes   Smokeless tobacco: Never  Vaping Use   Vaping Use: Never used  Substance and Sexual Activity   Alcohol use: No    Comment: hx heavy alcohol, hasn't drank in 22 yrs   Drug use: Yes  Types: Marijuana   Sexual activity: Yes  Other Topics Concern   Not on file  Social History Narrative   WORKING [employer] AND [employer].   Social Determinants of Health   Financial Resource Strain: Not on file  Food Insecurity: Not on file  Transportation Needs: Not on file  Physical Activity: Not on file  Stress: Not on file  Social Connections: Not on file  Intimate Partner Violence: Not on file    Review of Systems: Gen: see HPI CV: Denies chest pain, heart palpitations, syncope, edema  Resp: Denies shortness of breath with rest, cough, wheezing GI: see HPI GU : Denies urinary burning, urinary frequency, urinary incontinence.  MS: Denies joint pain,swelling, cramping Derm: Denies rash, itching, dry skin Psych: Denies depression, anxiety,confusion, or memory loss Heme: see HPI   Physical Exam: Vital signs in last 24 hours: Temp:  [97.5 F (36.4 C)-98.2 F (36.8 C)] 98.2 F (36.8 C) (12/27 1211) Pulse Rate:  [87-116] 112 (12/27 1400) Resp:  [12-27] 19 (12/27 1400) BP: (94-137)/(58-93) 94/62 (12/27 1400) SpO2:  [100 %] 100 % (12/27 1400) Weight:  [83 kg-86.6 kg] 83 kg (12/27 1211) Last BM Date: 08/09/21 General:   Alert,  acutely ill, diaphoretic, laying in bed but oriented X 4 Head:  Normocephalic and atraumatic. Eyes:  Sclera clear, no  icterus.    Ears:  Normal auditory acuity. Nose:  No deformity, discharge,  or lesions. Mouth:  No deformity or lesions, dentition normal. Lungs:  Clear throughout to auscultation.    Heart:  S1 S2 present, tachycardic Abdomen:  Soft, mild TTP epigastric and nondistended. No masses, hepatosplenomegaly or hernias noted. Normal bowel sounds, without guarding, and without rebound.   Rectal:  Deferred until time of colonoscopy.   Msk:  Symmetrical without gross deformities. Normal posture. Pulses:  Normal pulses noted. Extremities:  Without  edema. Neurologic:  Alert and  oriented x4 Psych:  Alert and cooperative. Normal mood and affect.  Intake/Output from previous day: No intake/output data recorded. Intake/Output this shift: Total I/O In: 1099 [IV Piggyback:1099] Out: 400 [Urine:400]  Lab Results: Recent Labs    08/09/21 0756  WBC 10.3  HGB 5.6*  HCT 18.7*  PLT 331   BMET Recent Labs    08/09/21 0756  NA 136  K 4.1  CL 107  CO2 19*  GLUCOSE 257*  BUN 17  CREATININE 1.04  CALCIUM 7.2*   LFT Recent Labs    08/09/21 0756  PROT 4.9*  ALBUMIN 2.5*  AST 18  ALT 13  ALKPHOS 45  BILITOT 0.4     Studies/Results: DG Chest Port 1 View  Result Date: 08/09/2021 CLINICAL DATA:  Hematemesis.  History of esophageal cancer. EXAM: PORTABLE CHEST 1 VIEW COMPARISON:  Chest x-ray dated June 07, 2019. FINDINGS: Unchanged right chest wall port catheter. The heart size and mediastinal contours are within normal limits. Normal pulmonary vascularity. No focal consolidation, pleural effusion, or pneumothorax. No acute osseous abnormality. IMPRESSION: No active disease. Electronically Signed   By: Titus Dubin M.D.   On: 08/09/2021 08:15    Impression: 55 y.o. year old male with a history of stage IV metastatic esophageal adenocarcinoma diagnosed in 2018, currently receiving palliative chemotherapy, palliative radiation in the past, presenting with acute hemorrhagic shock,  hematemesis, and profound anemia with Hgb 5.6 on presentation, receiving emergent transfusion of 2 units PRBCs while in the ED.  2 additional units have been ordered.    Acute UGI bleed secondary to known adenocarcinoma, with limited  options available at this time. Could consider EGD with hemospray on 12/28, which will only be a short-term solution.   Palliative Consult in process. Patient desires to treat the treatable and live life to the fullest that he can every day; he does express understanding that he knows his time is limited. Appreciate palliative consultation.   Plan: Transfuse as needed Continue with supportive measures Appreciate palliative care input Further recommendations to follow.  Annitta Needs, PhD, ANP-BC [facility]     LOS: 0 days    08/09/2021, 2:13 PM

## 2021-08-09 NOTE — ED Provider Notes (Addendum)
[facility] EMERGENCY DEPARTMENT Provider Note   CSN: [id number] Arrival date & time: 08/09/21  0751     History Chief Complaint  Patient presents with   Hematemesis    Maxwell Henderson is a 55 y.o. male.  HPI Presenting by EMS with bloody emesis and hypotension. Recent EGD with esophageal dilitation. Getting Chemotherapy at [facility].  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[place]) 05/03/2019   Intractable abdominal pain 02/19/2019   Esophageal cancer ([place]) 02/03/2019   Goals of care, counseling/discussion 11/28/2018   Type 2 diabetes mellitus without complication, without long-term current use of insulin ([place])    Essential hypertension    SIRS (systemic inflammatory response syndrome) ([place]) 12/11/2017   HCAP (healthcare-associated pneumonia) 12/11/2017   Infection due to parainfluenza virus 3 12/11/2017   PNA (pneumonia) 12/10/2017   Hypokalemia 12/10/2017   Iron deficiency anemia due to chronic blood loss 02/28/2017   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   Dysphagia 01/05/2017    Past Surgical History:  Procedure Laterality Date   ESOPHAGOGASTRODUODENOSCOPY (EGD) WITH PROPOFOL N/A 01/16/2017   Procedure: ESOPHAGOGASTRODUODENOSCOPY (EGD) WITH PROPOFOL;  Surgeon: [name], MD;  Location: [facility] ENDO [facility];  Service: Endoscopy;  Laterality: N/A;  730    ESOPHAGOGASTRODUODENOSCOPY (EGD) WITH PROPOFOL N/A 11/01/2020   Procedure: ESOPHAGOGASTRODUODENOSCOPY (EGD) WITH PROPOFOL;  Surgeon: Daneil Dolin, MD;  Location: [facility] ENDO SU[facility];  Service: Endoscopy;  Laterality: N/A;   ESOPHAGOGASTRODUODENOSCOPY (EGD) WITH PROPOFOL N/A 07/17/2021   Procedure: ESOPHAGOGASTRODUODENOSCOPY (EGD) WITH PROPOFOL;  Surgeon: Harvel Quale, MD;  Location: [facility] ENDO SUITE;  Service: Gastroenterology;  Laterality: N/A;   IR FLUORO GUIDE PORT INSERTION RIGHT  02/13/2017   IR US GUIDE VASC ACCESS RIGHT  02/13/2017   lipoma removal     PORTA CATH INSERTION Right 02/13/2017   SAVORY DILATION N/A 01/16/2017   Procedure: SAVORY DILATION;  Surgeon: [name], MD;  Location: [facility] END[facility];  Service: Endoscopy;  Laterality: N/A;       Family History  Problem Relation Age of Onset   Prostate cancer Father    Colon cancer Neg Hx    Colon polyps Neg Hx     Social History   Tobacco Use   Smoking status: Former    Packs/day: 0.25    Types: Cigarettes   Smokeless tobacco: Never  Vaping Use   Vaping Use: Never used  Substance Use Topics   Alcohol use: No    Comment: hx heavy alcohol, hasn't drank in 22 yrs   Drug use: Yes    Types: Marijuana    Home Medications Prior to Admission medications   Medication Sig Start Date End Date Taking? Authorizing Provider  dicyclomine (BENTYL) 10 MG capsule Take 10 mg by mouth 4 (four) times daily as needed for spasms.  Patient not taking: Reported on 07/16/2021 05/22/19   [provider]  famotidine (PEPCID) 20 MG tablet Take 20 mg by mouth 2 (two) times daily. 05/22/19   [provider]  gabapentin (NEURONTIN) 300 MG capsule  Take 300 mg by mouth 3 (three) times daily. 06/25/21   [provider]  Lactulose 20 GM/30ML SOLN Take 30 mLs (20 g total) by mouth 2 (two) times a day. 02/17/19   Derek Jack, MD  methadone (DOLOPHINE) 10 MG tablet Take by mouth See admin instructions. Take 2.5 tablets by mouth 3 times a day 07/15/21   [provider]  oxycodone (ROXICODONE) 30 MG immediate release tablet Take 30 mg by mouth every 4  (four) hours as needed for pain. 07/15/21   [provider]  pantoprazole (PROTONIX) 40 MG tablet Take 1 tablet (40 mg total) by mouth 2 (two) times daily. 07/17/21 10/15/21  Manuella Ghazi, Pratik D, DO  sucralfate (CARAFATE) 1 g tablet Take 1 tablet (1 g total) by mouth 4 (four) times daily -  with meals and at bedtime. 07/17/21 08/16/21  Manuella Ghazi, Pratik D, DO  thiamine 100 MG tablet Take 1 tablet by mouth daily. Patient not taking: Reported on 07/16/2021 02/13/19   [provider]    Allergies    Ketamine and related  Review of Systems   Review of Systems  Unable to perform ROS: Acuity of condition  All other systems reviewed and are negative.  Physical Exam Updated Vital Signs BP 106/68    Pulse (!) 110    Temp (!) 97.5 F (36.4 C) (Oral)    Resp 18    Ht 6\' 1"  (1.854 m)    Wt 86.6 kg    SpO2 100%    BMI 25.20 kg/m   Physical Exam Vitals and nursing note reviewed.  Constitutional:      Appearance: He is well-developed. He is not ill-appearing.  HENT:     Head: Normocephalic and atraumatic.     Right Ear: External ear normal.     Left Ear: External ear normal.  Eyes:     Conjunctiva/sclera: Conjunctivae normal.     Pupils: Pupils are equal, round, and reactive to light.     Comments: Pale conjunctiva  Neck:     Trachea: Phonation normal.  Cardiovascular:     Rate and Rhythm: Regular rhythm. Tachycardia present.     Heart sounds: Normal heart sounds.  Pulmonary:     Effort: Pulmonary effort is normal. No respiratory distress.     Breath sounds: Normal breath sounds. No stridor.  Abdominal:     General: There is no distension.     Palpations: Abdomen is soft. There is no mass.     Tenderness: There is no abdominal tenderness. There is no guarding.  Musculoskeletal:        General: Normal range of motion.     Cervical back: Normal range of motion and neck supple.  Skin:    General: Skin is warm and dry.  Neurological:     Mental Status: He is alert and oriented to  person, place, and time.     Cranial Nerves: No cranial nerve deficit.     Sensory: No sensory deficit.     Motor: No abnormal muscle tone.     Coordination: Coordination normal.  Psychiatric:        Mood and Affect: Mood normal.        Behavior: Behavior normal.        Thought Content: Thought content normal.        Judgment: Judgment normal.    ED Results / Procedures / Treatments   Labs (all labs ordered are listed, but only abnormal results are displayed) Labs Reviewed  COMPREHENSIVE METABOLIC PANEL - Abnormal; Notable for the following components:      Result Value   CO2 19 (*)    Glucose, Bld 257 (*)    Calcium 7.2 (*)    Total Protein 4.9 (*)    Albumin 2.5 (*)    All other components within normal limits  CBC WITH DIFFERENTIAL/PLATELET - Abnormal; Notable for the following components:   RBC 2.94 (*)    Hemoglobin 5.6 (*)    HCT 18.7 (*)    MCV 63.6 (*)    MCH 19.0 (*)    MCHC 29.9 (*)    RDW 24.4 (*)    Abs Immature Granulocytes 0.10 (*)    All other components within normal limits  RESP PANEL BY RT-PCR (FLU A&B, COVID) ARPGX2  PREPARE RBC (CROSSMATCH)  TYPE AND SCREEN  TYPE AND SCREEN    EKG None    Date: 08/09/2021  Rate: 102  Rhythm: sinus tachycardia  QRS Axis: normal  PR and QT Intervals: normal  ST/T Wave abnormalities: nonspecific ST changes  PR and QRS Conduction Disutrbances:none   Radiology DG Chest Port 1 View  Result Date: 08/09/2021 CLINICAL DATA:  Hematemesis.  History of esophageal cancer. EXAM: PORTABLE CHEST 1 VIEW COMPARISON:  Chest x-ray dated June 07, 2019. FINDINGS: Unchanged right chest wall port catheter. The heart size and mediastinal contours are within normal limits. Normal pulmonary vascularity. No focal consolidation, pleural effusion, or pneumothorax. No acute osseous abnormality. IMPRESSION: No active disease. Electronically Signed   By: Titus Dubin M.D.   On: 08/09/2021 08:15    Procedures .Critical  Care Performed by: Daleen Bo, MD Authorized by: Daleen Bo, MD   Critical care provider statement:    Critical care time (minutes):  40   Critical care start time:  08/09/2021 8:05 AM   Critical care end time:  08/09/2021 10:01 AM   Critical care time was exclusive of:  Separately billable procedures and treating other patients   Critical care was necessary to treat or prevent imminent or life-threatening deterioration of the following conditions:  Circulatory failure   Critical care was time spent personally by me on the following activities:  Blood draw for specimens, development of treatment plan with patient or surrogate, discussions with consultants, evaluation of patient's response to treatment, examination of patient, ordering and performing treatments and interventions, ordering and review of laboratory studies, ordering and review of radiographic studies, pulse oximetry, re-evaluation of patient's condition and review of old charts   Medications Ordered in ED Medications  pantoprozole (PROTONIX) 80 mg /NS 100 mL infusion (8 mg/hr Intravenous New Bag/Given 08/09/21 0919)  pantoprazole (PROTONIX) injection 40 mg (has no administration in time range)  0.9 %  sodium chloride infusion (0 mL/hr Intravenous Stopped 08/09/21 0807)  sodium chloride 0.9 % bolus 1,000 mL (0 mLs Intravenous Stopped 08/09/21 0900)  ondansetron (ZOFRAN) injection 4 mg (4 mg Intravenous Given 08/09/21 0900)  pantoprazole (PROTONIX) 80 mg /NS 100 mL IVPB (80 mg Intravenous New Bag/Given 08/09/21 0920)    ED Course  I have reviewed the triage vital signs and the nursing notes.  Pertinent labs & imaging results that were available during my care of the patient were reviewed by me and considered in my medical decision making (see chart for details).  Clinical Course as of 08/09/21 1022  Tue Aug 09, 2021  0819 Hemoglobin 5.6, down from 8.5 on 07/17/2021.  Anticipate transfusion with 4 units, second two by  crossmatch. [EW]  0820 Blood  pressure relatively stable, now 108/71 [EW]  0835 He states he is feeling better now.  Blood pressure reading 131/110.  He is somewhat tremulous.  Patient states he was eating per normal yesterday and began to feel ill around 4 AM, shaky and weak.  Recently he has been eating things like hashbrowns and bacon.  He has not had any episodes of inability to swallow. [EW]  0901 I discussed the case with Dr. Jenetta Downer, gastroenterology, who knows the patient well.  He states he will follow as a Optometrist and does not believe that the patient needs endoscopy at this time. [EW]  2992 ABO/RH(D): O POS [EW]  4268 He is complaining of upper abdominal pain.  Will give dose of fentanyl while awaiting hospitalist consultation. [EW]    Clinical Course User Index [EW] Daleen Bo, MD   MDM Rules/Calculators/A&P                          Patient Vitals for the past 24 hrs:  BP Temp Temp src Pulse Resp SpO2 Height Weight  08/09/21 0945 106/68 -- -- (!) 110 18 100 % -- --  08/09/21 0930 109/74 -- -- (!) 108 18 100 % -- --  08/09/21 0915 112/75 -- -- 100 16 100 % -- --  08/09/21 0900 115/78 -- -- 93 14 100 % -- --  08/09/21 0845 137/84 -- -- 98 (!) 22 100 % -- --  08/09/21 0840 119/79 -- -- 91 (!) 23 100 % -- --  08/09/21 0835 122/77 -- -- 87 13 100 % -- --  08/09/21 0825 129/80 (!) 97.5 F (36.4 C) Oral 90 (!) 22 100 % -- --  08/09/21 0822 99/81 -- -- 93 (!) 23 100 % -- --  08/09/21 0815 108/71 (!) 97.5 F (36.4 C) Oral 98 20 100 % -- --  08/09/21 0807 122/74 -- -- (!) 104 16 100 % -- --  08/09/21 0800 -- -- -- -- -- -- 6\' 1"  (1.854 m) 86.6 kg  08/09/21 0757 (!) 104/58 97.8 F (36.6 C) Oral (!) 108 (!) 23 100 % -- --  08/09/21 0754 105/61 -- -- -- -- -- -- --    9:55 AM Reevaluation with update and discussion. After initial assessment and treatment, an updated evaluation reveals fairly comfortable at this time without further complaints.Daleen Bo   Medical  Decision Making:  This patient is presenting for evaluation of hematemesis, which does require a range of treatment options, and is a complaint that involves a moderate risk of morbidity and mortality. The differential diagnoses include bleeding ulcer, bleeding malignancy. I decided to review old records, and in summary middle-aged male being followed for GI malignancy with gastric ulcer, and recent dilatation of distal esophagus presenting with acute bleeding episode.  I did not require additional historical information from anyone.  Clinical Laboratory Tests Ordered, included CBC, Metabolic panel, and type and screen, viral panel . Review indicates normal except hemoglobin low, MCV low, CO2 low, glucose high, calcium low, total protein low, albumin low. Radiologic Tests Ordered, included chest x-ray.  I independently Visualized: Radiograph images, which show no infiltrate or edema  Cardiac Monitor Tracing which shows normal sinus rhythm   Specimen of emesis indicating bright red blood.   Critical Interventions-clinical evaluation, fluid resuscitation, emergent blood administration O-, crossmatch blood administration, laboratory testing, radiography, observation and reassessment.  Gastroenterology consult  After These Interventions, the Patient was reevaluated and was found with improving clinical  status, blood pressure better, patient feeling better after blood ministration.  Case discussed with GI who recommends starting Protonix.  Patient requires hospitalization for stabilization.  He does not require transfer to his oncologic center at this time.  CRITICAL CARE-yes Performed by: Daleen Bo  Nursing Notes Reviewed/ Care Coordinated Applicable Imaging Reviewed Interpretation of Laboratory Data incorporated into ED treatment   10:02 AM-Consult complete with hospitalist. Patient case explained and discussed.  He agrees to admit patient for further evaluation and treatment. 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

## 2021-08-10 DIAGNOSIS — K922 Gastrointestinal hemorrhage, unspecified: Secondary | ICD-10-CM

## 2021-08-10 DIAGNOSIS — I10 Essential (primary) hypertension: Secondary | ICD-10-CM | POA: Diagnosis not present

## 2021-08-10 DIAGNOSIS — K920 Hematemesis: Secondary | ICD-10-CM | POA: Diagnosis not present

## 2021-08-10 DIAGNOSIS — D62 Acute posthemorrhagic anemia: Secondary | ICD-10-CM | POA: Diagnosis not present

## 2021-08-10 LAB — CBC WITH DIFFERENTIAL/PLATELET
Abs Immature Granulocytes: 0.07 10*3/uL (ref 0.00–0.07)
Basophils Absolute: 0 10*3/uL (ref 0.0–0.1)
Basophils Relative: 0 %
Eosinophils Absolute: 0 10*3/uL (ref 0.0–0.5)
Eosinophils Relative: 0 %
HCT: 25.9 % — ABNORMAL LOW (ref 39.0–52.0)
Hemoglobin: 8.4 g/dL — ABNORMAL LOW (ref 13.0–17.0)
Immature Granulocytes: 1 %
Lymphocytes Relative: 6 %
Lymphs Abs: 0.7 10*3/uL (ref 0.7–4.0)
MCH: 23 pg — ABNORMAL LOW (ref 26.0–34.0)
MCHC: 32.4 g/dL (ref 30.0–36.0)
MCV: 71 fL — ABNORMAL LOW (ref 80.0–100.0)
Monocytes Absolute: 0.4 10*3/uL (ref 0.1–1.0)
Monocytes Relative: 4 %
Neutro Abs: 9.4 10*3/uL — ABNORMAL HIGH (ref 1.7–7.7)
Neutrophils Relative %: 89 %
Platelets: 189 10*3/uL (ref 150–400)
RBC: 3.65 MIL/uL — ABNORMAL LOW (ref 4.22–5.81)
RDW: 27.7 % — ABNORMAL HIGH (ref 11.5–15.5)
WBC: 10.6 10*3/uL — ABNORMAL HIGH (ref 4.0–10.5)
nRBC: 0 % (ref 0.0–0.2)

## 2021-08-10 LAB — COMPREHENSIVE METABOLIC PANEL
ALT: 9 U/L (ref 0–44)
AST: 13 U/L — ABNORMAL LOW (ref 15–41)
Albumin: 2.7 g/dL — ABNORMAL LOW (ref 3.5–5.0)
Alkaline Phosphatase: 44 U/L (ref 38–126)
Anion gap: 5 (ref 5–15)
BUN: 20 mg/dL (ref 6–20)
CO2: 23 mmol/L (ref 22–32)
Calcium: 7.8 mg/dL — ABNORMAL LOW (ref 8.9–10.3)
Chloride: 107 mmol/L (ref 98–111)
Creatinine, Ser: 0.84 mg/dL (ref 0.61–1.24)
GFR, Estimated: >60 mL/min (ref >60)
Glucose, Bld: 108 mg/dL — ABNORMAL HIGH (ref 70–99)
Potassium: 3.9 mmol/L (ref 3.5–5.1)
Sodium: 135 mmol/L (ref 135–145)
Total Bilirubin: 1 mg/dL (ref 0.3–1.2)
Total Protein: 5.2 g/dL — ABNORMAL LOW (ref 6.5–8.1)

## 2021-08-10 LAB — GLUCOSE, CAPILLARY
Glucose-Capillary: 101 mg/dL — ABNORMAL HIGH (ref 70–99)
Glucose-Capillary: 155 mg/dL — ABNORMAL HIGH (ref 70–99)
Glucose-Capillary: 157 mg/dL — ABNORMAL HIGH (ref 70–99)
Glucose-Capillary: 71 mg/dL (ref 70–99)

## 2021-08-10 LAB — HEMOGLOBIN AND HEMATOCRIT, BLOOD
HCT: 26.3 % — ABNORMAL LOW (ref 39.0–52.0)
Hemoglobin: 8.6 g/dL — ABNORMAL LOW (ref 13.0–17.0)

## 2021-08-10 LAB — MAGNESIUM: Magnesium: 1.9 mg/dL (ref 1.7–2.4)

## 2021-08-10 MED ORDER — ENSURE ENLIVE PO LIQD
237.0000 mL | Freq: Three times a day (TID) | ORAL | Status: DC
  Administered 2021-08-10 – 2021-08-14 (×11): 237 mL via ORAL

## 2021-08-10 MED ORDER — ADULT MULTIVITAMIN W/MINERALS CH
1.0000 | ORAL_TABLET | Freq: Every day | ORAL | Status: DC
  Administered 2021-08-10 – 2021-08-14 (×5): 1 via ORAL
  Filled 2021-08-10 (×5): qty 1

## 2021-08-10 MED ORDER — CHLORHEXIDINE GLUCONATE CLOTH 2 % EX PADS
6.0000 | MEDICATED_PAD | Freq: Every day | CUTANEOUS | Status: DC
  Administered 2021-08-10 – 2021-08-14 (×5): 6 via TOPICAL

## 2021-08-10 MED ORDER — SUCRALFATE 1 GM/10ML PO SUSP
1.0000 g | Freq: Three times a day (TID) | ORAL | Status: DC
  Administered 2021-08-10 – 2021-08-14 (×18): 1 g via ORAL
  Filled 2021-08-10 (×18): qty 10

## 2021-08-10 MED ORDER — METHADONE HCL 10 MG PO TABS
5.0000 mg | ORAL_TABLET | Freq: Two times a day (BID) | ORAL | Status: DC
  Filled 2021-08-10 (×6): qty 1

## 2021-08-10 NOTE — Progress Notes (Signed)
Initial Nutrition Assessment  DOCUMENTATION CODES:  Non-severe (moderate) malnutrition in context of chronic illness  INTERVENTION:  Continue to advance diet as medically able and as tolerated.  Add Ensure Plus High Protein po TID, each supplement provides 350 kcal and 20 grams of protein.   Add Magic cup TID with meals, each supplement provides 290 kcal and 9 grams of protein.  Add MVI with minerals daily.  Encourage PO and supplement intake.  NUTRITION DIAGNOSIS: Moderate Malnutrition related to chronic illness, cancer and cancer related treatments as evidenced by moderate fat depletion, moderate muscle depletion.  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 12/16 - EGD  Spoke with pt and wife at bedside. Patient reports only having emesis for one day PTA. Otherwise, he reports, as long as he is feeling well, he eats normally and has a normal appetite.  He reports losing 9 lbs this round of illness since Thanksgiving this year. Per Epic, pt has lost ~16.5 lbs (8.3%) in the past 4.5 months, which is significant and severe for the time frame.  Spoke with pt regarding feeding tube if it were to come to that, pt refused a feeding tube and is not interested in artificial feeding at this time.  Pt open to drinking Ensure and having Magic Cup while in the hospital.  Pt at risk  for severe malnutrition given cancer.  Medications: reviewed; SSI, methadone, Protonix, sucralfate QID, NaCl @ 100 ml/hr  Labs: reviewed; CBG 95-127 HbA1c: 5.6% (07/17/2021)  NUTRITION - FOCUSED PHYSICAL EXAM: Flowsheet Row Most Recent Value  Orbital Region Moderate depletion  Upper Arm Region Moderate depletion  Thoracic and Lumbar Region Mild depletion  Buccal Region Moderate depletion  Temple Region Moderate depletion  Clavicle Bone Region Moderate depletion  Clavicle and Acromion Bone Region Moderate depletion  Scapular Bone Region Moderate depletion  Dorsal Hand No depletion  Patellar Region Moderate depletion  Anterior Thigh Region Moderate depletion  Posterior Calf Region Severe depletion  Edema (RD Assessment) None  Hair Reviewed  Eyes Reviewed  Mouth Reviewed  Skin Reviewed  Nails Reviewed   Diet Order:   Diet Order             Diet full liquid Room service appropriate? Yes; Fluid consistency: Thin  Diet effective now                  EDUCATION NEEDS:  Education needs have been addressed  Skin:  Skin Assessment: Reviewed RN Assessment  Last BM:  08/09/21 - Type 6, black/red, small  Height:  Ht Readings from Last 1 Encounters:  08/09/21 6\' 1"  (1.854 m)   Weight:  Wt Readings from Last 1 Encounters:  08/09/21 83 kg   BMI:  Body mass index is 24.14 kg/m.  Estimated Nutritional Needs:  Kcal:  4081-4481 Protein:  110-125 grams Fluid:  >2.4 L  Derrel Nip, RD, LDN (she/her/hers) Clinical Inpatient Dietitian RD Pager/After-Hours/Weekend Pager # in [place]

## 2021-08-10 NOTE — Discharge Instructions (Signed)
Nutrition Post Hospital Stay Proper nutrition can help your body recover from illness and injury.   Foods and beverages high in protein, vitamins, and minerals help rebuild muscle loss, promote healing, & reduce fall risk.   .In addition to eating healthy foods, a nutrition shake is an easy, delicious way to get the nutrition you need during and after your hospital stay  It is recommended that you continue to drink 2 bottles per day of: Ensure for at least 1 month (30 days) after your hospital stay   Tips for adding a nutrition shake into your routine: As allowed, drink one with vitamins or medications instead of water or juice Enjoy one as a tasty mid-morning or afternoon snack Drink cold or make a milkshake out of it Drink one instead of milk with cereal or snacks Use as a coffee creamer   Available at the following grocery stores and pharmacies:           * [employer] * [employer] * [employer]co  * Rite Aid          * Walmart * [employer]  * Walgreens      * Target  * BJ's   * CVS  * [employer]   * [facility] [phone]            For COUPONS visit: www.ensure.com/join or [email or web address]/members/sign-[email or web address]   Suggested Substitutions Ensure Plus = Boost Plus = Carnation Breakfast Essentials = Boost Compact Ensure Active Clear = Boost Breeze Glucerna Shake = Boost Glucose Control = Carnation Breakfast Essentials SUGAR FREE  Suggestions For Increasing Calories And Protein . Several small meals a day are easier to eat and digest than three large ones. Space meals about 2 to 3 hours apart to maximize comfort. . Stop eating 2 to 3 hours before bed and sleep with your head elevated if gastric reflux (GERD) and heartburn are problems. . Do not eat your favorite foods if you are feeling bad. Save them for when you feel good! . Eat breakfast-type foods at any meal. Eggs are usually easy to eat and are great any time of the day. (The same goes for pancakes and waffles.) . Eat  when you feel hungry. Most people have the greatest appetite in the morning because they have not eaten all night. If this is the best meal for you, then pile on those calories and other nutrients in the morning and at lunch. Then you can have a smaller dinner without losing total calories for the day. . Eat leftovers or nutritious snacks in the afternoon and early evening to round out your day. . Try homemade or commercially prepared nutrition bars and puddings, as well as calorie- and protein-rich liquid nutritional supplements. Benefits of Physical Activity Talk to your doctor about physical activity. Light or moderate physical activity can help maintain muscle and promote an appetite. Walking in the neighborhood or the local mall is a great way to get up, get out, and get moving. If you are unsteady on your feet, try walking around the dining room table. Save Room for Calorie-Rich Food! Drink most fluids between meals instead of with meals. (It is fine to have a sip to help swallow food at meal time.) Fluids (which usually have fewer calories and nutrients than solid food) can take up valuable space in your stomach.  Foods Recommended High-Protein Foods Milk products Add cheese to toast, crackers, sandwiches, baked potatoes, vegetables, soups, noodles, meat, and fruit. Use reduced-fat (2%)   or whole milk in place of water when cooking cereal and cream soups. Include cream sauces on vegetables and pasta. Add powdered milk to cream soups and mashed potatoes.  Eggs Have hard-cooked eggs readily available in the refrigerator. Chop and add to salads, casseroles, soups, and vegetables. Make a quick egg salad. All eggs should be well cooked to avoid the risk of harmful bacteria.  Meats, poultry, and fish Add leftover cooked meats to soups, casseroles, salads, and omelets. Make dip by mixing diced, chopped, or shredded meat with sour cream and spices.  Beans, legumes, nuts, and seeds Sprinkle nuts and seeds  on cereals, fruit, and desserts such as ice cream, pudding, and custard. Also serve nuts and seeds on vegetables, salads, and pasta. Spread peanut butter on toast, bread, English muffins, and fruit, or blend it in a milk shake. Add beans and peas to salads, soups, casseroles, and vegetable dishes.  High-Calorie Foods Butter, margarine, and  oils Melt butter or margarine over potatoes, rice, pasta, and cooked vegetables. Add melted butter or margarine into soups and casseroles and spread on bread for sandwiches before spreading sandwich spread or peanut butter. Saut or stir-fry vegetables, meats, chicken and fish such as shrimp/scallops in olive or canola oil. A variety of oils add calories and can be used to marinate meat, chicken, or fish.  Milk products Add whipping cream to desserts, pancakes, waffles, fruit, and hot chocolate, and fold it into soups and casseroles. Add sour cream to baked potatoes and vegetables.  Salad dressing Use regular (not low-fat or diet) mayonnaise and salad dressing on sandwiches and in dips with vegetables and fruit.   Sweets Add jelly and honey to bread and crackers. Add jam to fruit and ice cream and as a topping over cake.   Copyright 2020  Academy of [employer]. All rights reserved. 

## 2021-08-10 NOTE — Progress Notes (Signed)
PROGRESS NOTE  Maxwell Henderson HER:[id number] DOB: 04-20-1966 DOA: 08/09/2021 PCP: [name], FNP  Brief History:  55 y.o. 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  He went on to receive chemotherapy around 07/21/2021 and reports that he had been doing fairly well.  He was having intermittent constipation and was taking prune juice.  He reports that he had been eating normal foods which he was told he could do after his last EGD.  He reports that he had a melanotic stool yesterday.  He started to feel ill at about 4 AM where he became diaphoretic and very weak.  He reports that he woke up this morning feeling chills and having a cramping epigastric abdominal pain.  It was associated with nausea and he vomited a moderate amount of bright red blood.  He felt unwell.  EMS was called to the house.   ED Course: EMS found him in hemorrhagic shock, severely hypotensive with blood pressure 76/40, heart rate was 110 CBG was 268, they bolused him 1 L of normal saline and brought him to the emergency department.  His hemoglobin was noted to be down to 5.6 from 8.5 on 07/17/2021.  He was ordered to units emergency PRBC transfusion which improved his blood pressure and an additional 2 units PRBC ordered.  He reports that he is vaccinated fully for COVID but has not received an influenza vaccine.  He reports that he did have a case of influenza A this season that he recovered from.   His SARS 2 coronavirus test and influenza testing was negative.  His chest x-ray showed no active disease.  After being resuscitated in the emergency department.   Assessment/Plan: Acute hemorrhagic shock  - secondary to GI bleed from gastric mass - improved after emergent transfusion of 4 units PRBC - continue IV hydration    Hematemesis  - secondary to esophageal mass and gastric ulceration - appreciate GI consultation  - continue IV protonix infusion x 72 hours - add carafate - advance to full liquids - resuscitation as noted above    Acute blood loss anemia  - transfused 4 units PRBC total - recheck CBC in AM    Hypotension  - improved with resuscitation efforts - follow closely in ICU   Esophageal adenocarcinoma -follows Dr. Emeterio Reeve at [facility] -last [place] on 07/21/21   Type 2 DM with hyperglycemia  - 07/17/21 A1C--5.6 - d/c SSI   GERD - protonix infusion ordered   Nausea and vomiting  - Zofran and Protonix ordered for symptoms   Chronic abdominal pain / Opioid dependence  - resume home methadone  - IV dilaudid ordered for breakthru pain and discomfort    Sinus tachycardia  - secondary to hemorrhagic shock  - continue supportive measures  - follow in ICU      Status is: Inpatient  Remains inpatient appropriate because: severity of illness requiring PRBC and hemodynamic instability        Family Communication:   spouse updated at bedside  12/28  Consultants:  GI  Code Status:  FULL   DVT Prophylaxis:  SCDs   Procedures: As Listed in Progress Note Above  Antibiotics: None       Subjective: Patient denies fevers, chills, headache, chest pain, dyspnea, nausea, vomiting, diarrhea, abdominal pain, dysuria, hematuria, hematochezia, and melena.   Objective: Vitals:   08/10/21 0857 08/10/21 0900 08/10/21 0930 08/10/21 1000  BP:  129/79 112/83 116/67  Pulse: 93 95 (!) 114 95  Resp: 13 18 16  (!) 22  Temp:      TempSrc:      SpO2:  100% 100% 100% 100%  Weight:      Height:        Intake/Output Summary (Last 24 hours) at 08/10/2021 1026 Last data filed at 08/10/2021 0915 Gross per 24 hour  Intake 1617.31 ml  Output 1150 ml  Net 467.31 ml   Weight change:  Exam:  General:  Pt is alert, follows commands appropriately, not in acute distress HEENT: No icterus, No thrush, No neck mass, [place]/AT Cardiovascular: RRR, S1/S2, no rubs, no gallops Respiratory: CTA bilaterally, no wheezing, no crackles, no rhonchi Abdomen: Soft/+BS, non tender, non distended, no guarding Extremities: No edema, No lymphangitis, No petechiae, No rashes, no synovitis   Data Reviewed: I have personally reviewed following labs and imaging studies Basic Metabolic Panel: Recent Labs  Lab 08/09/21 0756 08/10/21 0418  NA 136 135  K 4.1 3.9  CL 107 107  CO2 19* 23  GLUCOSE 257* 108*  BUN 17 20  CREATININE 1.04 0.84  CALCIUM 7.2* 7.8*  MG  --  1.9   Liver Function Tests: Recent Labs  Lab 08/09/21 0756 08/10/21 0418  AST 18 13*  ALT 13 9  ALKPHOS 45 44  BILITOT 0.4 1.0  PROT 4.9* 5.2*  ALBUMIN 2.5* 2.7*   No results for input(s): LIPASE, AMYLASE in the last 168 hours. No results for input(s): AMMONIA in the last 168 hours. Coagulation Profile: No results for input(s): INR, PROTIME in the last 168 hours. CBC: Recent Labs  Lab 08/09/21 0756 08/09/21 1504 08/10/21 0016 08/10/21 0418  WBC 10.3  --   --  10.6*  NEUTROABS 7.3  --   --  9.4*  HGB 5.6* 7.5* 8.6* 8.4*  HCT 18.7* 23.3* 26.3* 25.9*  MCV 63.6*  --   --  71.0*  PLT 331  --   --  189   Cardiac Enzymes: No results for input(s): CKTOTAL, CKMB, CKMBINDEX, TROPONINI in the last 168 hours. BNP: Invalid input(s): POCBNP CBG: Recent Labs  Lab 08/09/21 1631 08/10/21 0753  GLUCAP 95 101*   HbA1C: No results for input(s): HGBA1C in the last 72 hours. Urine analysis:    Component Value Date/Time   COLORURINE YELLOW 05/03/2019 0325   APPEARANCEUR CLOUDY (A)  05/03/2019 0325   LABSPEC 1.039 (H) 05/03/2019 0325   PHURINE 7.0 05/03/2019 0325   GLUCOSEU NEGATIVE 05/03/2019 0325   HGBUR NEGATIVE 05/03/2019 0325   BILIRUBINUR NEGATIVE 05/03/2019 0325   KETONESUR NEGATIVE 05/03/2019 0325   PROTEINUR NEGATIVE 05/03/2019 0325   NITRITE NEGATIVE 05/03/2019 0325   LEUKOCYTESUR NEGATIVE 05/03/2019 0325   Sepsis Labs: @LABRCNTIP (procalcitonin:4,lacticidven:4) ) Recent Results (from the past 240 hour(s))  Resp Panel by RT-PCR (Flu A&B, Covid) Nasopharyngeal Swab     Status: None   Collection Time: 08/09/21  8:14 AM   Specimen: Nasopharyngeal Swab; Nasopharyngeal(NP) swabs in vial transport medium  Result Value Ref Range Status   SARS Coronavirus 2 by RT PCR  NEGATIVE NEGATIVE Final    Comment: (NOTE) SARS-CoV-2 target nucleic acids are NOT DETECTED.  The SARS-CoV-2 RNA is generally detectable in upper respiratory specimens during the acute phase of infection. The lowest concentration of SARS-CoV-2 viral copies this assay can detect is 138 copies/mL. A negative result does not preclude SARS-Cov-2 infection and should not be used as the sole basis for treatment or other patient management decisions. A negative result may occur with  improper specimen collection/handling, submission of specimen other than nasopharyngeal swab, presence of viral mutation(s) within the areas targeted by this assay, and inadequate number of viral copies(<138 copies/mL). A negative result must be combined with clinical observations, patient history, and epidemiological information. The expected result is Negative.  Fact Sheet for Patients:  EntrepreneurPulse.com.au  Fact Sheet for Healthcare Providers:  IncredibleEmployment.be  This test is no t yet approved or cleared by the [place] FDA and  has been authorized for detection and/or diagnosis of SARS-CoV-2 by FDA under an Emergency Use Authorization (EUA). This EUA will  remain  in effect (meaning this test can be used) for the duration of the COVID-19 declaration under Section 564(b)(1) of the Act, 21 U.S.C.section 360bbb-3(b)(1), unless the authorization is terminated  or revoked sooner.       Influenza A by PCR NEGATIVE NEGATIVE Final   Influenza B by PCR NEGATIVE NEGATIVE Final    Comment: (NOTE) The Xpert Xpress SARS-CoV-2/FLU/RSV plus assay is intended as an aid in the diagnosis of influenza from Nasopharyngeal swab specimens and should not be used as a sole basis for treatment. Nasal washings and aspirates are unacceptable for Xpert Xpress SARS-CoV-2/FLU/RSV testing.  Fact Sheet for Patients: EntrepreneurPulse.com.au  Fact Sheet for Healthcare Providers: IncredibleEmployment.be  This test is not yet approved or cleared by the [place] FDA and has been authorized for detection and/or diagnosis of SARS-CoV-2 by FDA under an Emergency Use Authorization (EUA). This EUA will remain in effect (meaning this test can be used) for the duration of the COVID-19 declaration under Section 564(b)(1) of the Act, 21 U.S.C. section 360bbb-3(b)(1), unless the authorization is terminated or revoked.  Performed at [facility], [address] [place], [place] [address]   MRSA Next Gen by PCR, Nasal     Status: None   Collection Time: 08/09/21 12:17 PM   Specimen: Nasal Mucosa; Nasal Swab  Result Value Ref Range Status   MRSA by PCR Next Gen NOT DETECTED NOT DETECTED Final    Comment: (NOTE) The GeneXpert MRSA Assay (FDA approved for NASAL specimens only), is one component of a comprehensive MRSA colonization surveillance program. It is not intended to diagnose MRSA infection nor to guide or monitor treatment for MRSA infections. Test performance is not FDA approved in patients less than 20 years old. Performed at [facility], [address] [place], [place] [address]      Scheduled Meds:  sodium chloride    Intravenous Once   Chlorhexidine Gluconate Cloth  6 each Topical Daily   insulin aspart  0-9 Units Subcutaneous Q6H   methadone  2.5 mg Oral Q8H   [START ON 08/12/2021] pantoprazole  40 mg Intravenous Q12H   sucralfate  1 g Oral TID WC & HS   Continuous Infusions:  sodium chloride 100 mL/hr at 08/09/21 1127   pantoprazole 8 mg/hr (08/09/21 1701)    Procedures/Studies: DG Chest Port 1 View  Result Date: 08/09/2021 CLINICAL DATA:  Hematemesis.  History of esophageal cancer. EXAM: PORTABLE CHEST 1 VIEW COMPARISON:  Chest x-ray  dated June 07, 2019. FINDINGS: Unchanged right chest wall port catheter. The heart size and mediastinal contours are within normal limits. Normal pulmonary vascularity. No focal consolidation, pleural effusion, or pneumothorax. No acute osseous abnormality. IMPRESSION: No active disease. Electronically Signed   By: Titus Dubin M.D.   On: 08/09/2021 08:15    Orson Eva, DO  [facility]  If 7PM-7AM, please contact night-coverage [email or web address] Password [email or web address] 08/10/2021, 10:26 AM   LOS: 1 day

## 2021-08-11 DIAGNOSIS — K254 Chronic or unspecified gastric ulcer with hemorrhage: Secondary | ICD-10-CM | POA: Diagnosis not present

## 2021-08-11 DIAGNOSIS — C159 Malignant neoplasm of esophagus, unspecified: Secondary | ICD-10-CM | POA: Diagnosis not present

## 2021-08-11 DIAGNOSIS — E440 Moderate protein-calorie malnutrition: Secondary | ICD-10-CM

## 2021-08-11 DIAGNOSIS — D62 Acute posthemorrhagic anemia: Secondary | ICD-10-CM | POA: Diagnosis not present

## 2021-08-11 DIAGNOSIS — Z515 Encounter for palliative care: Secondary | ICD-10-CM | POA: Diagnosis not present

## 2021-08-11 DIAGNOSIS — Z7189 Other specified counseling: Secondary | ICD-10-CM | POA: Diagnosis not present

## 2021-08-11 DIAGNOSIS — E43 Unspecified severe protein-calorie malnutrition: Secondary | ICD-10-CM | POA: Insufficient documentation

## 2021-08-11 DIAGNOSIS — I10 Essential (primary) hypertension: Secondary | ICD-10-CM | POA: Diagnosis not present

## 2021-08-11 DIAGNOSIS — K920 Hematemesis: Secondary | ICD-10-CM | POA: Diagnosis not present

## 2021-08-11 LAB — BPAM RBC
Blood Product Expiration Date: 202301162359
Blood Product Expiration Date: 202301162359
Blood Product Expiration Date: 202301212359
Blood Product Expiration Date: 202301212359
ISSUE DATE / TIME: 202212270757
ISSUE DATE / TIME: 202212270758
ISSUE DATE / TIME: 202212271725
ISSUE DATE / TIME: 202212272004
Unit Type and Rh: 5100
Unit Type and Rh: 5100
Unit Type and Rh: 9500
Unit Type and Rh: 9500

## 2021-08-11 LAB — TYPE AND SCREEN
ABO/RH(D): O POS
Antibody Screen: NEGATIVE
Unit division: 0
Unit division: 0
Unit division: 0
Unit division: 0

## 2021-08-11 LAB — COMPREHENSIVE METABOLIC PANEL
ALT: 11 U/L (ref 0–44)
AST: 14 U/L — ABNORMAL LOW (ref 15–41)
Albumin: 2.9 g/dL — ABNORMAL LOW (ref 3.5–5.0)
Alkaline Phosphatase: 50 U/L (ref 38–126)
Anion gap: 6 (ref 5–15)
BUN: 15 mg/dL (ref 6–20)
CO2: 26 mmol/L (ref 22–32)
Calcium: 8 mg/dL — ABNORMAL LOW (ref 8.9–10.3)
Chloride: 104 mmol/L (ref 98–111)
Creatinine, Ser: 0.83 mg/dL (ref 0.61–1.24)
GFR, Estimated: >60 mL/min (ref >60)
Glucose, Bld: 108 mg/dL — ABNORMAL HIGH (ref 70–99)
Potassium: 3.7 mmol/L (ref 3.5–5.1)
Sodium: 136 mmol/L (ref 135–145)
Total Bilirubin: 0.6 mg/dL (ref 0.3–1.2)
Total Protein: 5.7 g/dL — ABNORMAL LOW (ref 6.5–8.1)

## 2021-08-11 LAB — CBC WITH DIFFERENTIAL/PLATELET
Abs Immature Granulocytes: 0.04 10*3/uL (ref 0.00–0.07)
Basophils Absolute: 0 10*3/uL (ref 0.0–0.1)
Basophils Relative: 0 %
Eosinophils Absolute: 0.1 10*3/uL (ref 0.0–0.5)
Eosinophils Relative: 1 %
HCT: 27.7 % — ABNORMAL LOW (ref 39.0–52.0)
Hemoglobin: 8.9 g/dL — ABNORMAL LOW (ref 13.0–17.0)
Immature Granulocytes: 0 %
Lymphocytes Relative: 10 %
Lymphs Abs: 0.9 10*3/uL (ref 0.7–4.0)
MCH: 23.3 pg — ABNORMAL LOW (ref 26.0–34.0)
MCHC: 32.1 g/dL (ref 30.0–36.0)
MCV: 72.5 fL — ABNORMAL LOW (ref 80.0–100.0)
Monocytes Absolute: 0.7 10*3/uL (ref 0.1–1.0)
Monocytes Relative: 8 %
Neutro Abs: 7.2 10*3/uL (ref 1.7–7.7)
Neutrophils Relative %: 81 %
Platelets: 197 10*3/uL (ref 150–400)
RBC: 3.82 MIL/uL — ABNORMAL LOW (ref 4.22–5.81)
RDW: 28.8 % — ABNORMAL HIGH (ref 11.5–15.5)
WBC: 9 10*3/uL (ref 4.0–10.5)
nRBC: 0 % (ref 0.0–0.2)

## 2021-08-11 LAB — MAGNESIUM: Magnesium: 1.9 mg/dL (ref 1.7–2.4)

## 2021-08-11 LAB — HEMOGLOBIN AND HEMATOCRIT, BLOOD
HCT: 28.4 % — ABNORMAL LOW (ref 39.0–52.0)
Hemoglobin: 9 g/dL — ABNORMAL LOW (ref 13.0–17.0)

## 2021-08-11 LAB — GLUCOSE, CAPILLARY: Glucose-Capillary: 143 mg/dL — ABNORMAL HIGH (ref 70–99)

## 2021-08-11 NOTE — Progress Notes (Signed)
°  Transition of Care [facility]) Screening Note   Patient Details  Name: Maxwell Henderson Date of Birth: [birth year]   Transition of Care [facility]) CM/SW Contact:    Boneta Lucks, RN Phone Number: 08/11/2021, 2:26 PM    Transition of Care Department [facility]) has reviewed patient and no TOC needs have been identified at this time. We will continue to monitor patient advancement through interdisciplinary progression rounds. If new patient transition needs arise, please place a TOC consult.  Palliative consult completed

## 2021-08-11 NOTE — Progress Notes (Signed)
Palliative: [name], Henderson, is lying quietly in bed in a dark room.  He greets me, making and somewhat keeping eye contact.  He is alert and oriented, able to make his basic needs known.  His wife, Maxwell Henderson, is at bedside.  We talked about his hemoglobin stable, further testing, GI consult.  We talked about outpatient follow-up with oncology.  Maxwell Henderson tells me that they are going to call oncologist at [facility] for a follow-up appointment the day after being released from [facility].  Maxwell Henderson often talks about his faith, and his communication with God.  We talked about outpatient palliative services.  I encourage Maxwell Henderson to ask his trusted oncologist at [facility] if outpatient palliative services would benefit him.  He and wife agree.  Conference with attending, bedside nursing staff, transition of care team related to patient condition, needs, goals of care, disposition.  Plan: At this point full scope/full code.  Follow-up with [facility] oncologist after discharge.  15 minutes Quinn Axe, NP Palliative medicine team Team phone [phone] Greater than 50% of this time was spent counseling and coordinating care related to the above assessment and plan.

## 2021-08-11 NOTE — Progress Notes (Addendum)
PROGRESS NOTE  Maxwell Henderson EHM:[id number] DOB: [birth year] DOA: 08/09/2021 PCP: [name], FNP     Brief History:  55 y.o. 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  He went on to receive chemotherapy around 07/21/2021 and reports that he had been doing fairly well.  He was having intermittent constipation and was taking prune juice.  He reports that he had been eating normal foods which he was told he could do after his last EGD.  He reports that he had a melanotic stool yesterday.  He started to feel ill at about 4 AM where he became diaphoretic and very weak.  He reports that he woke up this morning feeling chills and having a cramping epigastric abdominal pain.  It was associated with nausea and he vomited a moderate amount of bright red blood.  He felt unwell.  EMS was called to the house.   ED Course: EMS found him in hemorrhagic shock, severely hypotensive with blood pressure 76/40, heart rate was 110 CBG was 268, they bolused him 1 L of normal saline and brought him to the emergency department.  His hemoglobin was noted to be down to 5.6 from 8.5 on 07/17/2021.  He was ordered to units emergency PRBC transfusion which improved his blood pressure and an additional 2 units PRBC ordered.  He reports that he is vaccinated fully for COVID but has not received an influenza vaccine.  He reports that he did have a case of influenza A this season that he recovered  from.  His SARS 2 coronavirus test and influenza testing was negative.  His chest x-ray showed no active disease.  After being resuscitated in the emergency department.    Assessment/Plan: Acute hemorrhagic shock  - secondary to GI bleed from gastric mass - improved after emergent transfusion of 4 units PRBC - advanced diet to full liquids - BP improved after PRBC - hemodynamically stable > 24 hours   Hematemesis/ Upper GI Bleed - secondary to esophageal mass and gastric ulceration - appreciate GI consultation  - continue IV protonix infusion x 72 hours--end on 12/30 @0900  - continue carafate - advanced to full liquids - 12/29 am--one small BM with small volume blood - 12/29 Hgb remains stable   Acute blood loss anemia  - transfused 4 units PRBC total - repeat H/H at noon - recheck CBC in AM  - discussed with Dr. Harvie Heck at [facility] accept patient in transfer--pt currently on wait list   Hypotension  - improved with resuscitation efforts/PRBC - BP remains stable   Esophageal adenocarcinoma -follows Dr. Emeterio Reeve at [facility] -last [place] on 07/21/21, next planned in Jan 2023   Type 2 DM with hyperglycemia  - 07/17/21 A1C--5.6 - d/c SSI   GERD - protonix infusion ordered   Nausea and vomiting  - Zofran and Protonix ordered for symptoms -improved, tolerating full liquids   Chronic abdominal pain / Opioid dependence  - resume home methadone  - IV dilaudid ordered for breakthru pain and  discomfort    Sinus tachycardia  - secondary to hemorrhagic shock  - continue supportive measures  - improved  Moderate malnutrition -add supplements when able         Status is: Inpatient   Remains inpatient appropriate because: severity of illness requiring PRBC and hemodynamic instability               Family Communication:   spouse updated at bedside 12/29   Consultants:  GI   Code Status:  FULL    DVT Prophylaxis:  SCDs     Procedures: As Listed in  Progress Note Above   Antibiotics: None       Subjective: Patient had small volume hematochezia this am.  Denies f/c, cp, sob, dizziness. Abd pain is controlled.  No n/v  Objective: Vitals:   08/11/21 0400 08/11/21 0450 08/11/21 0500 08/11/21 0700  BP:   120/79 115/75  Pulse: 94  81 86  Resp: 17  18 (!) 9  Temp:  98 F (36.7 C)    TempSrc:  Oral    SpO2: 99%  100% 100%  Weight:  83.5 kg    Height:        Intake/Output Summary (Last 24 hours) at 08/11/2021 0753 Last data filed at 08/10/2021 1847 Gross per 24 hour  Intake 470.72 ml  Output 700 ml  Net -229.28 ml   Weight change: -3.137 kg Exam:  General:  Pt is alert, follows commands appropriately, not in acute distress HEENT: No icterus, No thrush, No neck mass, [place]/AT Cardiovascular: RRR, S1/S2, no rubs, no gallops Respiratory: CTA bilaterally, no wheezing, no crackles, no rhonchi Abdomen: Soft/+BS, epigastric tender, non distended, no guarding Extremities: No edema, No lymphangitis, No petechiae, No rashes, no synovitis   Data Reviewed: I have personally reviewed following labs and imaging studies Basic Metabolic Panel: Recent Labs  Lab 08/09/21 0756 08/10/21 0418 08/11/21 0416  NA 136 135 136  K 4.1 3.9 3.7  CL 107 107 104  CO2 19* 23 26  GLUCOSE 257* 108* 108*  BUN 17 20 15   CREATININE 1.04 0.84 0.83  CALCIUM 7.2* 7.8* 8.0*  MG  --  1.9 1.9   Liver Function Tests: Recent Labs  Lab 08/09/21 0756 08/10/21 0418 08/11/21 0416  AST 18 13* 14*  ALT 13 9 11   ALKPHOS 45 44 50  BILITOT 0.4 1.0 0.6  PROT 4.9* 5.2* 5.7*  ALBUMIN 2.5* 2.7* 2.9*   No results for input(s): LIPASE, AMYLASE in the last 168 hours. No results for input(s): AMMONIA in the last 168 hours. Coagulation Profile: No results for input(s): INR, PROTIME in the last 168 hours. CBC: Recent Labs  Lab 08/09/21 0756 08/09/21 1504 08/10/21 0016 08/10/21 0418 08/11/21 0416  WBC 10.3  --   --  10.6* 9.0  NEUTROABS 7.3  --   --   9.4* 7.2  HGB 5.6* 7.5* 8.6* 8.4* 8.9*  HCT 18.7* 23.3* 26.3* 25.9* 27.7*  MCV 63.6*  --   --  71.0* 72.5*  PLT 331  --   --  189 197   Cardiac Enzymes: No results for input(s): CKTOTAL, CKMB, CKMBINDEX, TROPONINI in the last 168 hours. BNP: Invalid input(s): POCBNP CBG: Recent Labs  Lab 08/09/21 1631 08/10/21 0753 08/10/21 1427 08/10/21 1819 08/10/21 2334  GLUCAP 95 101* 155* 157* 71   HbA1C: No results for input(s): HGBA1C in the last 72 hours. Urine analysis:    Component Value Date/Time   COLORURINE YELLOW 05/03/2019 0325   APPEARANCEUR  CLOUDY (A) 05/03/2019 0325   LABSPEC 1.039 (H) 05/03/2019 0325   PHURINE 7.0 05/03/2019 0325   GLUCOSEU NEGATIVE 05/03/2019 0325   HGBUR NEGATIVE 05/03/2019 0325   BILIRUBINUR NEGATIVE 05/03/2019 0325   KETONESUR NEGATIVE 05/03/2019 0325   PROTEINUR NEGATIVE 05/03/2019 0325   NITRITE NEGATIVE 05/03/2019 0325   LEUKOCYTESUR NEGATIVE 05/03/2019 0325   Sepsis Labs: @LABRCNTIP (procalcitonin:4,lacticidven:4) ) Recent Results (from the past 240 hour(s))  Resp Panel by RT-PCR (Flu A&B, Covid) Nasopharyngeal Swab     Status: None   Collection Time: 08/09/21  8:14 AM   Specimen: Nasopharyngeal Swab; Nasopharyngeal(NP) swabs in vial transport medium  Result Value Ref Range Status   SARS Coronavirus 2 by RT PCR NEGATIVE NEGATIVE Final    Comment: (NOTE) SARS-CoV-2 target nucleic acids are NOT DETECTED.  The SARS-CoV-2 RNA is generally detectable in upper respiratory specimens during the acute phase of infection. The lowest concentration of SARS-CoV-2 viral copies this assay can detect is 138 copies/mL. A negative result does not preclude SARS-Cov-2 infection and should not be used as the sole basis for treatment or other patient management decisions. A negative result may occur with  improper specimen collection/handling, submission of specimen other than nasopharyngeal swab, presence of viral mutation(s) within the areas targeted by  this assay, and inadequate number of viral copies(<138 copies/mL). A negative result must be combined with clinical observations, patient history, and epidemiological information. The expected result is Negative.  Fact Sheet for Patients:  EntrepreneurPulse.com.au  Fact Sheet for Healthcare Providers:  IncredibleEmployment.be  This test is no t yet approved or cleared by the [place] FDA and  has been authorized for detection and/or diagnosis of SARS-CoV-2 by FDA under an Emergency Use Authorization (EUA). This EUA will remain  in effect (meaning this test can be used) for the duration of the COVID-19 declaration under Section 564(b)(1) of the Act, 21 U.S.C.section 360bbb-3(b)(1), unless the authorization is terminated  or revoked sooner.       Influenza A by PCR NEGATIVE NEGATIVE Final   Influenza B by PCR NEGATIVE NEGATIVE Final    Comment: (NOTE) The Xpert Xpress SARS-CoV-2/FLU/RSV plus assay is intended as an aid in the diagnosis of influenza from Nasopharyngeal swab specimens and should not be used as a sole basis for treatment. Nasal washings and aspirates are unacceptable for Xpert Xpress SARS-CoV-2/FLU/RSV testing.  Fact Sheet for Patients: EntrepreneurPulse.com.au  Fact Sheet for Healthcare Providers: IncredibleEmployment.be  This test is not yet approved or cleared by the [place] FDA and has been authorized for detection and/or diagnosis of SARS-CoV-2 by FDA under an Emergency Use Authorization (EUA). This EUA will remain in effect (meaning this test can be used) for the duration of the COVID-19 declaration under Section 564(b)(1) of the Act, 21 U.S.C. section 360bbb-3(b)(1), unless the authorization is terminated or revoked.  Performed at [facility], [address] [place], [place] [address]   MRSA Next Gen by PCR, Nasal     Status: None   Collection Time: 08/09/21 12:17 PM    Specimen: Nasal Mucosa; Nasal Swab  Result Value Ref Range Status   MRSA by PCR Next Gen NOT DETECTED NOT DETECTED Final    Comment: (NOTE) The GeneXpert MRSA Assay (FDA approved for NASAL specimens only), is one component of a comprehensive MRSA colonization surveillance program. It is not intended to diagnose MRSA infection nor to guide or monitor treatment for MRSA infections. Test performance is not FDA approved in patients less than 19 years old. Performed at [facility], [address] [place], [place] [address]      Scheduled Meds:  sodium chloride   Intravenous Once   Chlorhexidine Gluconate Cloth  6 each Topical Daily   feeding supplement  237 mL Oral TID BM   insulin aspart  0-9 Units Subcutaneous Q6H   methadone  5 mg Oral BID   multivitamin with minerals  1 tablet Oral Daily   [START ON 08/12/2021] pantoprazole  40 mg Intravenous Q12H   sucralfate  1 g Oral TID WC & HS   Continuous Infusions:  pantoprazole 8 mg/hr (08/10/21 1847)    Procedures/Studies: DG Chest Port 1 View  Result Date: 08/09/2021 CLINICAL DATA:  Hematemesis.  History of esophageal cancer. EXAM: PORTABLE CHEST 1 VIEW COMPARISON:  Chest x-ray dated June 07, 2019. FINDINGS: Unchanged right chest wall port catheter. The heart size and mediastinal contours are within normal limits. Normal pulmonary vascularity. No focal consolidation, pleural effusion, or pneumothorax. No acute osseous abnormality. IMPRESSION: No active disease. Electronically Signed   By: Titus Dubin M.D.   On: 08/09/2021 08:15    Orson Eva, DO  [facility]  If 7PM-7AM, please contact night-coverage [email or web address] Password [email or web address] 08/11/2021, 7:53 AM   LOS: 2 days

## 2021-08-12 LAB — CBC
HCT: 28.7 % — ABNORMAL LOW (ref 39.0–52.0)
Hemoglobin: 9.1 g/dL — ABNORMAL LOW (ref 13.0–17.0)
MCH: 23.2 pg — ABNORMAL LOW (ref 26.0–34.0)
MCHC: 31.7 g/dL (ref 30.0–36.0)
MCV: 73.2 fL — ABNORMAL LOW (ref 80.0–100.0)
Platelets: 207 10*3/uL (ref 150–400)
RBC: 3.92 MIL/uL — ABNORMAL LOW (ref 4.22–5.81)
WBC: 8.4 10*3/uL (ref 4.0–10.5)
nRBC: 0 % (ref 0.0–0.2)

## 2021-08-12 LAB — GLUCOSE, CAPILLARY
Glucose-Capillary: 104 mg/dL — ABNORMAL HIGH (ref 70–99)
Glucose-Capillary: 150 mg/dL — ABNORMAL HIGH (ref 70–99)
Glucose-Capillary: 123 mg/dL — ABNORMAL HIGH (ref 70–99)

## 2021-08-12 LAB — HEMOGLOBIN AND HEMATOCRIT, BLOOD
HCT: 28.5 % — ABNORMAL LOW (ref 39.0–52.0)
Hemoglobin: 9.1 g/dL — ABNORMAL LOW (ref 13.0–17.0)

## 2021-08-12 MED ORDER — CALCIUM CARBONATE ANTACID 500 MG PO CHEW
1.0000 | CHEWABLE_TABLET | Freq: Two times a day (BID) | ORAL | Status: DC | PRN
  Administered 2021-08-12: 04:00:00 200 mg via ORAL
  Filled 2021-08-12: qty 1

## 2021-08-12 MED ORDER — TRAZODONE HCL 50 MG PO TABS
50.0000 mg | ORAL_TABLET | Freq: Every day | ORAL | Status: DC
  Administered 2021-08-12 – 2021-08-14 (×3): 50 mg via ORAL
  Filled 2021-08-12 (×3): qty 1

## 2021-08-12 NOTE — Progress Notes (Signed)
PROGRESS NOTE  Maxwell Henderson QZR:[id number] DOB: 05/27/1966 DOA: 08/09/2021 PCP: [name], FNP   Brief History:  55 y.o. 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  He went on to receive chemotherapy around 07/21/2021 and reports that he had been doing fairly well.  He was having intermittent constipation and was taking prune juice.  He reports that he had been eating normal foods which he was told he could do after his last EGD.  He reports that he had a melanotic stool yesterday.  He started to feel ill at about 4 AM where he became diaphoretic and very weak.  He reports that he woke up this morning feeling chills and having a cramping epigastric abdominal pain.  It was associated with nausea and he vomited a moderate amount of bright red blood.  He felt unwell.  EMS was called to the house.   ED Course: EMS found him in hemorrhagic shock, severely hypotensive with blood pressure 76/40, heart rate was 110 CBG was 268, they bolused him 1 L of normal saline and brought him to the emergency department.  His hemoglobin was noted to be down to 5.6 from 8.5 on 07/17/2021.  He was ordered to units emergency PRBC transfusion which improved his blood pressure and an additional 2 units PRBC ordered.  He reports that he is vaccinated fully for COVID but has not received an influenza vaccine.  He reports that he did have a case of influenza A this season that he recovered  from.  His SARS 2 coronavirus test and influenza testing was negative.  His chest x-ray showed no active disease.  After being resuscitated in the emergency department.    Assessment/Plan: Acute hemorrhagic shock  - secondary to GI bleed from gastric mass - improved after emergent transfusion of 4 units PRBC - advanced diet to full liquids - BP improved after PRBC - hemodynamically stable > 24 hours   Hematemesis/ Upper GI Bleed - secondary to esophageal mass and gastric ulceration - appreciate GI consultation  - continue IV protonix infusion x 72 hours--end on 12/30 @0900  -12/30 transitioned to bid protonix - continue carafate - advanced to soft diet - 12/29 am--one small BM with small volume blood - 12/29 Hgb remains stable   Acute blood loss anemia  - transfused 4 units PRBC total - repeat H/H at noon--Hgb remains stable - recheck CBC in AM  - discussed with Dr. Harvie Heck at [facility] accept patient in transfer--pt currently on wait list   Hypotension  - improved with resuscitation efforts/PRBC - BP remains stable   Esophageal adenocarcinoma -follows Dr. Emeterio Reeve at [facility] -last [place] on 07/21/21, next planned in Jan 2023   Type 2 DM with hyperglycemia  - 07/17/21 A1C--5.6 - d/c SSI   GERD - protonix infusion ordered>>received 72 hours -12/30 transitioned to bid protonix   Nausea and vomiting  - Zofran and Protonix ordered for symptoms -improved, tolerating full liquids   Chronic abdominal pain / Opioid dependence  -  resume home methadone  - IV dilaudid ordered for breakthru pain and discomfort    Sinus tachycardia  - secondary to hemorrhagic shock  - continue supportive measures  - improved   Moderate malnutrition -add supplements when able         Status is: Inpatient   Remains inpatient appropriate because: severity of illness requiring PRBC and hemodynamic instability               Family Communication:   spouse updated at bedside  12/30   Consultants:  GI   Code Status:  FULL    DVT Prophylaxis:  SCDs     Procedures: As Listed in Progress Note Above   Antibiotics: None    Subjective: Patient denies fevers, chills, headache, chest pain, dyspnea, nausea, vomiting, diarrhea, abdominal pain, dysuria, hematuria, hematochezia, and melena.   Objective: Vitals:   08/12/21 1100 08/12/21 1200 08/12/21 1430 08/12/21 1640  BP:  (!) 106/52 118/72   Pulse:  100 99   Resp:  16 15   Temp: 97.8 F (36.6 C)   97.8 F (36.6 C)  TempSrc: Oral   Oral  SpO2:  100% 100%   Weight:      Height:        Intake/Output Summary (Last 24 hours) at 08/12/2021 1709 Last data filed at 08/12/2021 1042 Gross per 24 hour  Intake 631.33 ml  Output 1700 ml  Net -1068.67 ml   Weight change: 0.4 kg Exam:  General:  Pt is alert, follows commands appropriately, not in acute distress HEENT: No icterus, No thrush, No neck mass, [place]/AT Cardiovascular: RRR, S1/S2, no rubs, no gallops Respiratory: CTA bilaterally, no wheezing, no crackles, no rhonchi Abdomen: Soft/+BS, non tender, non distended, no guarding Extremities: No edema, No lymphangitis, No petechiae, No rashes, no synovitis   Data Reviewed: I have personally reviewed following labs and imaging studies Basic Metabolic Panel: Recent Labs  Lab 08/09/21 0756 08/10/21 0418 08/11/21 0416  NA 136 135 136  K 4.1 3.9 3.7  CL 107 107 104  CO2 19* 23 26  GLUCOSE 257* 108* 108*  BUN 17 20 15   CREATININE 1.04 0.84 0.83  CALCIUM 7.2* 7.8* 8.0*  MG  --  1.9 1.9   Liver Function Tests: Recent Labs  Lab 08/09/21 0756 08/10/21 0418 08/11/21 0416  AST 18 13* 14*  ALT 13 9 11   ALKPHOS 45 44 50  BILITOT 0.4 1.0 0.6  PROT 4.9* 5.2* 5.7*  ALBUMIN 2.5* 2.7* 2.9*   No results for input(s): LIPASE, AMYLASE in the last 168 hours. No results for input(s): AMMONIA in the last 168 hours. Coagulation Profile: No results for input(s): INR, PROTIME in the last 168  hours. CBC: Recent Labs  Lab 08/09/21 0756 08/09/21 1504 08/10/21 0418 08/11/21 0416 08/11/21 1234 08/12/21 0426 08/12/21 1123  WBC 10.3  --  10.6* 9.0  --  8.4  --   NEUTROABS 7.3  --  9.4* 7.2  --   --   --   HGB 5.6*   < > 8.4* 8.9* 9.0* 9.1* 9.1*  HCT 18.7*   < > 25.9* 27.7* 28.4* 28.7* 28.5*  MCV 63.6*  --  71.0* 72.5*  --  73.2*  --   PLT 331  --  189 197  --  207  --    < > = values in this interval not displayed.   Cardiac Enzymes: No results for input(s): CKTOTAL, CKMB, CKMBINDEX, TROPONINI in the last 168 hours. BNP: Invalid  input(s): POCBNP CBG: Recent Labs  Lab 08/10/21 1819 08/10/21 2334 08/11/21 1822 08/12/21 0754 08/12/21 1122  GLUCAP 157* 71 143* 104* 150*   HbA1C: No results for input(s): HGBA1C in the last 72 hours. Urine analysis:    Component Value Date/Time   COLORURINE YELLOW 05/03/2019 0325   APPEARANCEUR CLOUDY (A) 05/03/2019 0325   LABSPEC 1.039 (H) 05/03/2019 0325   PHURINE 7.0 05/03/2019 0325   GLUCOSEU NEGATIVE 05/03/2019 0325   HGBUR NEGATIVE 05/03/2019 0325   BILIRUBINUR NEGATIVE 05/03/2019 0325   KETONESUR NEGATIVE 05/03/2019 0325   PROTEINUR NEGATIVE 05/03/2019 0325   NITRITE NEGATIVE 05/03/2019 0325   LEUKOCYTESUR NEGATIVE 05/03/2019 0325   Sepsis Labs: @LABRCNTIP (procalcitonin:4,lacticidven:4) ) Recent Results (from the past 240 hour(s))  Resp Panel by RT-PCR (Flu A&B, Covid) Nasopharyngeal Swab     Status: None   Collection Time: 08/09/21  8:14 AM   Specimen: Nasopharyngeal Swab; Nasopharyngeal(NP) swabs in vial transport medium  Result Value Ref Range Status   SARS Coronavirus 2 by RT PCR NEGATIVE NEGATIVE Final    Comment: (NOTE) SARS-CoV-2 target nucleic acids are NOT DETECTED.  The SARS-CoV-2 RNA is generally detectable in upper respiratory specimens during the acute phase of infection. The lowest concentration of SARS-CoV-2 viral copies this assay can detect is 138 copies/mL. A negative result does not preclude  SARS-Cov-2 infection and should not be used as the sole basis for treatment or other patient management decisions. A negative result may occur with  improper specimen collection/handling, submission of specimen other than nasopharyngeal swab, presence of viral mutation(s) within the areas targeted by this assay, and inadequate number of viral copies(<138 copies/mL). A negative result must be combined with clinical observations, patient history, and epidemiological information. The expected result is Negative.  Fact Sheet for Patients:  EntrepreneurPulse.com.au  Fact Sheet for Healthcare Providers:  IncredibleEmployment.be  This test is no t yet approved or cleared by the [place] FDA and  has been authorized for detection and/or diagnosis of SARS-CoV-2 by FDA under an Emergency Use Authorization (EUA). This EUA will remain  in effect (meaning this test can be used) for the duration of the COVID-19 declaration under Section 564(b)(1) of the Act, 21 U.S.C.section 360bbb-3(b)(1), unless the authorization is terminated  or revoked sooner.       Influenza A by PCR NEGATIVE NEGATIVE Final   Influenza B by PCR NEGATIVE NEGATIVE Final    Comment: (NOTE) The Xpert Xpress SARS-CoV-2/FLU/RSV plus assay is intended as an aid in the diagnosis of influenza from Nasopharyngeal swab specimens and should not be used as a sole basis for treatment. Nasal washings and aspirates are unacceptable for Xpert Xpress SARS-CoV-2/FLU/RSV testing.  Fact Sheet for Patients: EntrepreneurPulse.com.au  Fact Sheet for Healthcare Providers: IncredibleEmployment.be  This test is not yet approved or cleared by the [place] FDA and has been authorized for detection and/or diagnosis of SARS-CoV-2 by FDA under an Emergency Use Authorization (EUA). This EUA will remain in effect (meaning this test can be used) for the duration of  the COVID-19 declaration under Section 564(b)(1) of the Act, 21 U.S.C. section 360bbb-3(b)(1), unless the authorization is terminated or revoked.  Performed at [facility], [address], [place], [place] [address]   MRSA Next Gen by PCR, Nasal     Status: None   Collection Time: 08/09/21 12:17 PM   Specimen: Nasal Mucosa; Nasal Swab  Result Value Ref Range Status   MRSA by PCR Next Gen NOT DETECTED NOT DETECTED Final    Comment: (  NOTE) The GeneXpert MRSA Assay (FDA approved for NASAL specimens only), is one component of a comprehensive MRSA colonization surveillance program. It is not intended to diagnose MRSA infection nor to guide or monitor treatment for MRSA infections. Test performance is not FDA approved in patients less than 53 years old. Performed at [facility], [address], [place], [place] [address]      Scheduled Meds:  sodium chloride   Intravenous Once   Chlorhexidine Gluconate Cloth  6 each Topical Daily   feeding supplement  237 mL Oral TID BM   insulin aspart  0-9 Units Subcutaneous Q6H   methadone  5 mg Oral BID   multivitamin with minerals  1 tablet Oral Daily   pantoprazole  40 mg Intravenous Q12H   sucralfate  1 g Oral TID WC & HS   traZODone  50 mg Oral QHS   Continuous Infusions:  Procedures/Studies: DG Chest Port 1 View  Result Date: 08/09/2021 CLINICAL DATA:  Hematemesis.  History of esophageal cancer. EXAM: PORTABLE CHEST 1 VIEW COMPARISON:  Chest x-ray dated June 07, 2019. FINDINGS: Unchanged right chest wall port catheter. The heart size and mediastinal contours are within normal limits. Normal pulmonary vascularity. No focal consolidation, pleural effusion, or pneumothorax. No acute osseous abnormality. IMPRESSION: No active disease. Electronically Signed   By: Titus Dubin M.D.   On: 08/09/2021 08:15    Orson Eva, DO  [facility]  If 7PM-7AM, please contact night-coverage [email or web address] Password [email or web address] 08/12/2021, 5:09 PM    LOS: 3 days

## 2021-08-13 LAB — CBC
HCT: 27.7 % — ABNORMAL LOW (ref 39.0–52.0)
Hemoglobin: 9 g/dL — ABNORMAL LOW (ref 13.0–17.0)
MCH: 23.6 pg — ABNORMAL LOW (ref 26.0–34.0)
MCHC: 32.5 g/dL (ref 30.0–36.0)
MCV: 72.5 fL — ABNORMAL LOW (ref 80.0–100.0)
Platelets: 220 10*3/uL (ref 150–400)
RBC: 3.82 MIL/uL — ABNORMAL LOW (ref 4.22–5.81)
WBC: 7.4 10*3/uL (ref 4.0–10.5)
nRBC: 0 % (ref 0.0–0.2)

## 2021-08-13 LAB — GLUCOSE, CAPILLARY
Glucose-Capillary: 92 mg/dL (ref 70–99)
Glucose-Capillary: 106 mg/dL — ABNORMAL HIGH (ref 70–99)
Glucose-Capillary: 117 mg/dL — ABNORMAL HIGH (ref 70–99)
Glucose-Capillary: 138 mg/dL — ABNORMAL HIGH (ref 70–99)

## 2021-08-13 NOTE — Progress Notes (Signed)
PROGRESS NOTE  Maxwell Henderson OJJ:[id number] DOB: Feb 14, 1966 DOA: 08/09/2021 PCP: [name], FNP  Brief History:  55 y.o. 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  He went on to receive chemotherapy around 07/21/2021 and reports that he had been doing fairly well.  He was having intermittent constipation and was taking prune juice.  He reports that he had been eating normal foods which he was told he could do after his last EGD.  He reports that he had a melanotic stool yesterday.  He started to feel ill at about 4 AM where he became diaphoretic and very weak.  He reports that he woke up this morning feeling chills and having a cramping epigastric abdominal pain.  It was associated with nausea and he vomited a moderate amount of bright red blood.  He felt unwell.  EMS was called to the house.   ED Course: EMS found him in hemorrhagic shock, severely hypotensive with blood pressure 76/40, heart rate was 110 CBG was 268, they bolused him 1 L of normal saline and brought him to the emergency department.  His hemoglobin was noted to be down to 5.6 from 8.5 on 07/17/2021.  He was ordered to units emergency PRBC transfusion which improved his blood pressure and an additional 2 units PRBC ordered.  He reports that he is vaccinated fully for COVID but has not received an influenza vaccine.  He reports that he did have a case of influenza A this season that he recovered from.   His SARS 2 coronavirus test and influenza testing was negative.  His chest x-ray showed no active disease.  After being resuscitated in the emergency department.    Assessment/Plan: Acute hemorrhagic shock  - secondary to GI bleed from gastric mass - improved after emergent transfusion of 4 units PRBC - advanced diet to full liquids - BP improved after PRBC - hemodynamically stable > 48 hours   Hematemesis/ Upper GI Bleed - secondary to esophageal mass and gastric ulceration - appreciate GI consultation  - continue IV protonix infusion x 72 hours--end on 12/30 @0900  -12/30 transitioned to bid protonix - continue carafate - advanced to soft diet - 12/29 am--one small BM with small volume blood - 12/31 Hgb remains stable   Acute blood loss anemia  - transfused 4 units PRBC total - repeat H/H at noon--Hgb remains stable - recheck CBC in AM  - discussed with Dr. Harvie Heck at [facility] accept patient in transfer--pt currently on wait list   Hypotension  - improved with resuscitation efforts/PRBC - BP remains stable   Esophageal adenocarcinoma -follows Dr. Emeterio Reeve at [facility] -last [place] on 07/21/21, next planned in Jan 2023   Type 2 DM with hyperglycemia  - 07/17/21 A1C--5.6 - d/c SSI   GERD - protonix infusion ordered>>received 72 hours -12/30 transitioned to bid protonix   Nausea and vomiting  - Zofran and Protonix ordered for symptoms -improved, tolerating full liquids   Chronic abdominal pain / Opioid dependence  - resume  home methadone  - IV dilaudid ordered for breakthru pain and discomfort    Sinus tachycardia  - secondary to hemorrhagic shock  - continue supportive measures  - improved   Moderate malnutrition -add supplements when able         Status is: Inpatient   Remains inpatient appropriate because: severity of illness requiring PRBC and hemodynamic instability               Family Communication:   spouse updated at bedside 12/30    Consultants:  GI   Code Status:  FULL    DVT Prophylaxis:  SCDs     Procedures: As Listed in Progress Note Above   Antibiotics: None     Subjective: Patient denies fevers, chills, headache, chest pain, dyspnea, nausea, vomiting, diarrhea, abdominal pain, dysuria, hematuria, hematochezia, and melena.   Objective: Vitals:   08/13/21 0030 08/13/21 0535 08/13/21 0828 08/13/21 1327  BP: 123/88 134/85 122/74 111/73  Pulse: 88 90 86 92  Resp: 20 20 18 16   Temp: 99 F (37.2 C) 98 F (36.7 C)  98.6 F (37 C)  TempSrc:    Oral  SpO2: 100% 100% 100% 100%  Weight:      Height:        Intake/Output Summary (Last 24 hours) at 08/13/2021 1555 Last data filed at 08/13/2021 1300 Gross per 24 hour  Intake 480 ml  Output --  Net 480 ml   Weight change: -2.888 kg Exam:  General:  Pt is alert, follows commands appropriately, not in acute distress HEENT: No icterus, No thrush, No neck mass, [place]/AT Cardiovascular: RRR, S1/S2, no rubs, no gallops Respiratory: CTA bilaterally, no wheezing, no crackles, no rhonchi Abdomen: Soft/+BS, non tender, non distended, no guarding Extremities: No edema, No lymphangitis, No petechiae, No rashes, no synovitis   Data Reviewed: I have personally reviewed following labs and imaging studies Basic Metabolic Panel: Recent Labs  Lab 08/09/21 0756 08/10/21 0418 08/11/21 0416  NA 136 135 136  K 4.1 3.9 3.7  CL 107 107 104  CO2 19* 23 26  GLUCOSE 257* 108* 108*  BUN 17 20 15   CREATININE 1.04 0.84 0.83  CALCIUM 7.2* 7.8* 8.0*  MG  --  1.9 1.9   Liver Function Tests: Recent Labs  Lab 08/09/21 0756 08/10/21 0418 08/11/21 0416  AST 18 13* 14*  ALT 13 9 11   ALKPHOS 45 44 50  BILITOT 0.4 1.0 0.6  PROT 4.9* 5.2* 5.7*  ALBUMIN 2.5* 2.7* 2.9*   No results for input(s): LIPASE, AMYLASE in the last 168 hours. No results for input(s): AMMONIA in the last 168 hours. Coagulation Profile: No results for input(s): INR, PROTIME in the last  168 hours. CBC: Recent Labs  Lab 08/09/21 0756 08/09/21 1504 08/10/21 0418 08/11/21 0416 08/11/21 1234 08/12/21 0426 08/12/21 1123 08/13/21 0438  WBC 10.3  --  10.6* 9.0  --  8.4  --  7.4  NEUTROABS 7.3  --  9.4* 7.2  --   --   --   --   HGB 5.6*   < > 8.4* 8.9* 9.0* 9.1* 9.1* 9.0*  HCT 18.7*   < > 25.9* 27.7* 28.4* 28.7* 28.5* 27.7*  MCV 63.6*  --  71.0* 72.5*  --  73.2*  --  72.5*  PLT 331  --  189 197  --  207  --  220   < > = values in this interval not displayed.   Cardiac Enzymes: No results for input(s):  CKTOTAL, CKMB, CKMBINDEX, TROPONINI in the last 168 hours. BNP: Invalid input(s): POCBNP CBG: Recent Labs  Lab 08/12/21 1122 08/12/21 1747 08/13/21 0107 08/13/21 0647 08/13/21 1200  GLUCAP 150* 123* 92 106* 117*   HbA1C: No results for input(s): HGBA1C in the last 72 hours. Urine analysis:    Component Value Date/Time   COLORURINE YELLOW 05/03/2019 0325   APPEARANCEUR CLOUDY (A) 05/03/2019 0325   LABSPEC 1.039 (H) 05/03/2019 0325   PHURINE 7.0 05/03/2019 0325   GLUCOSEU NEGATIVE 05/03/2019 0325   HGBUR NEGATIVE 05/03/2019 0325   BILIRUBINUR NEGATIVE 05/03/2019 0325   KETONESUR NEGATIVE 05/03/2019 0325   PROTEINUR NEGATIVE 05/03/2019 0325   NITRITE NEGATIVE 05/03/2019 0325   LEUKOCYTESUR NEGATIVE 05/03/2019 0325   Sepsis Labs: @LABRCNTIP (procalcitonin:4,lacticidven:4) ) Recent Results (from the past 240 hour(s))  Resp Panel by RT-PCR (Flu A&B, Covid) Nasopharyngeal Swab     Status: None   Collection Time: 08/09/21  8:14 AM   Specimen: Nasopharyngeal Swab; Nasopharyngeal(NP) swabs in vial transport medium  Result Value Ref Range Status   SARS Coronavirus 2 by RT PCR NEGATIVE NEGATIVE Final    Comment: (NOTE) SARS-CoV-2 target nucleic acids are NOT DETECTED.  The SARS-CoV-2 RNA is generally detectable in upper respiratory specimens during the acute phase of infection. The lowest concentration of SARS-CoV-2 viral copies this assay can detect is 138  copies/mL. A negative result does not preclude SARS-Cov-2 infection and should not be used as the sole basis for treatment or other patient management decisions. A negative result may occur with  improper specimen collection/handling, submission of specimen other than nasopharyngeal swab, presence of viral mutation(s) within the areas targeted by this assay, and inadequate number of viral copies(<138 copies/mL). A negative result must be combined with clinical observations, patient history, and epidemiological information. The expected result is Negative.  Fact Sheet for Patients:  EntrepreneurPulse.com.au  Fact Sheet for Healthcare Providers:  IncredibleEmployment.be  This test is no t yet approved or cleared by the [place] FDA and  has been authorized for detection and/or diagnosis of SARS-CoV-2 by FDA under an Emergency Use Authorization (EUA). This EUA will remain  in effect (meaning this test can be used) for the duration of the COVID-19 declaration under Section 564(b)(1) of the Act, 21 U.S.C.section 360bbb-3(b)(1), unless the authorization is terminated  or revoked sooner.       Influenza A by PCR NEGATIVE NEGATIVE Final   Influenza B by PCR NEGATIVE NEGATIVE Final    Comment: (NOTE) The Xpert Xpress SARS-CoV-2/FLU/RSV plus assay is intended as an aid in the diagnosis of influenza from Nasopharyngeal swab specimens and should not be used as a sole basis for treatment. Nasal washings and aspirates are unacceptable for Xpert Xpress SARS-CoV-2/FLU/RSV testing.  Fact Sheet for Patients: EntrepreneurPulse.com.au  Fact Sheet for Healthcare Providers: IncredibleEmployment.be  This test is not yet approved or cleared by the [place] FDA and has been authorized for detection and/or diagnosis of SARS-CoV-2 by FDA under an Emergency Use Authorization (EUA). This EUA will remain in effect (meaning  this test can be used) for the duration of the COVID-19 declaration under Section 564(b)(1) of the Act, 21 U.S.C. section 360bbb-3(b)(1), unless the authorization is terminated or revoked.  Performed at [facility], [address], [place], [place] [address]   MRSA Next Gen by PCR, Nasal     Status: None   Collection Time: 08/09/21 12:17 PM   Specimen: Nasal Mucosa; Nasal Swab  Result Value Ref Range Status   MRSA by PCR  Next Gen NOT DETECTED NOT DETECTED Final    Comment: (NOTE) The GeneXpert MRSA Assay (FDA approved for NASAL specimens only), is one component of a comprehensive MRSA colonization surveillance program. It is not intended to diagnose MRSA infection nor to guide or monitor treatment for MRSA infections. Test performance is not FDA approved in patients less than 52 years old. Performed at [facility], [address], [place], [place] [address]      Scheduled Meds:  sodium chloride   Intravenous Once   Chlorhexidine Gluconate Cloth  6 each Topical Daily   feeding supplement  237 mL Oral TID BM   insulin aspart  0-9 Units Subcutaneous Q6H   methadone  5 mg Oral BID   multivitamin with minerals  1 tablet Oral Daily   pantoprazole  40 mg Intravenous Q12H   sucralfate  1 g Oral TID WC & HS   traZODone  50 mg Oral QHS   Continuous Infusions:  Procedures/Studies: DG Chest Port 1 View  Result Date: 08/09/2021 CLINICAL DATA:  Hematemesis.  History of esophageal cancer. EXAM: PORTABLE CHEST 1 VIEW COMPARISON:  Chest x-ray dated June 07, 2019. FINDINGS: Unchanged right chest wall port catheter. The heart size and mediastinal contours are within normal limits. Normal pulmonary vascularity. No focal consolidation, pleural effusion, or pneumothorax. No acute osseous abnormality. IMPRESSION: No active disease. Electronically Signed   By: Titus Dubin M.D.   On: 08/09/2021 08:15    Orson Eva, DO  [facility]  If 7PM-7AM, please contact  night-coverage [email or web address] Password [email or web address] 08/13/2021, 3:55 PM   LOS: 4 days

## 2021-08-13 NOTE — Progress Notes (Signed)
[facility] called this am and stated patient was on bed list, but no beds available at this time.  Would call back when appropriate.

## 2021-08-14 LAB — CBC
HCT: 27.2 % — ABNORMAL LOW (ref 39.0–52.0)
Hemoglobin: 8.4 g/dL — ABNORMAL LOW (ref 13.0–17.0)
MCH: 22.5 pg — ABNORMAL LOW (ref 26.0–34.0)
MCHC: 30.9 g/dL (ref 30.0–36.0)
MCV: 72.7 fL — ABNORMAL LOW (ref 80.0–100.0)
Platelets: 221 10*3/uL (ref 150–400)
RBC: 3.74 MIL/uL — ABNORMAL LOW (ref 4.22–5.81)
WBC: 6.7 10*3/uL (ref 4.0–10.5)
nRBC: 0 % (ref 0.0–0.2)

## 2021-08-14 LAB — GLUCOSE, CAPILLARY
Glucose-Capillary: 102 mg/dL — ABNORMAL HIGH (ref 70–99)
Glucose-Capillary: 105 mg/dL — ABNORMAL HIGH (ref 70–99)
Glucose-Capillary: 112 mg/dL — ABNORMAL HIGH (ref 70–99)
Glucose-Capillary: 112 mg/dL — ABNORMAL HIGH (ref 70–99)
Glucose-Capillary: 95 mg/dL (ref 70–99)

## 2021-08-14 LAB — HEMOGLOBIN AND HEMATOCRIT, BLOOD
HCT: 27.7 % — ABNORMAL LOW (ref 39.0–52.0)
Hemoglobin: 8.8 g/dL — ABNORMAL LOW (ref 13.0–17.0)

## 2021-08-14 MED ORDER — INFLUENZA VAC SPLIT QUAD 0.5 ML IM SUSY
0.5000 mL | PREFILLED_SYRINGE | INTRAMUSCULAR | Status: DC
Start: 2021-08-15 — End: 2021-08-15

## 2021-08-14 MED ORDER — TRAZODONE HCL 50 MG PO TABS: 50.0000 mg | ORAL_TABLET | Freq: Every day | ORAL | Status: DC

## 2021-08-14 NOTE — Progress Notes (Signed)
PROGRESS NOTE  Maxwell Henderson MVH:[id number] DOB: 09-10-1965 DOA: 08/09/2021 PCP: [name], FNP  Brief History:  56 y.o. 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  He went on to receive chemotherapy around 07/21/2021 and reports that he had been doing fairly well.  He was having intermittent constipation and was taking prune juice.  He reports that he had been eating normal foods which he was told he could do after his last EGD.  He reports that he had a melanotic stool yesterday.  He started to feel ill at about 4 AM where he became diaphoretic and very weak.  He reports that he woke up this morning feeling chills and having a cramping epigastric abdominal pain.  It was associated with nausea and he vomited a moderate amount of bright red blood.  He felt unwell.  EMS was called to the house.   ED Course: EMS found him in hemorrhagic shock, severely hypotensive with blood pressure 76/40, heart rate was 110 CBG was 268, they bolused him 1 L of normal saline and brought him to the emergency department.  His hemoglobin was noted to be down to 5.6 from 8.5 on 07/17/2021.  He was ordered to units emergency PRBC transfusion which improved his blood pressure and an additional 2 units PRBC ordered.  He reports that he is vaccinated fully for COVID but has not received an influenza vaccine.  He reports that he did have a case of influenza A this season that he recovered from.   His SARS 2 coronavirus test and influenza testing was negative.  His chest x-ray showed no active disease.  After being resuscitated in the emergency department.    Assessment/Plan: Acute hemorrhagic shock  - secondary to GI bleed from gastric mass - improved after emergent transfusion of 4 units PRBC - advanced diet to full liquids - BP improved after PRBC - hemodynamically stable > 72 hours   Hematemesis/ Upper GI Bleed - secondary to esophageal mass and gastric ulceration - appreciate GI consultation  - continue IV protonix infusion x 72 hours--end on 12/30 @0900  -12/30 transitioned to bid protonix - continue carafate - advanced to soft diet - 12/29 am--one small BM with small volume blood - 12/31 Hgb remains stable   Acute blood loss anemia  - transfused 4 units PRBC total - repeat H/H at noon--Hgb remains stable - recheck CBC in AM  - discussed with Dr. Harvie Heck at [facility] accept patient in transfer--pt currently on wait list   Hypotension  - improved with resuscitation efforts/PRBC - BP remains stable   Esophageal adenocarcinoma -follows Dr. Emeterio Reeve at [facility] -last [place] on 07/21/21, next planned in Jan 2023   Type 2 DM with hyperglycemia  - 07/17/21 A1C--5.6 - d/c SSI   GERD - protonix infusion ordered>>received 72 hours -12/30 transitioned to bid protonix   Nausea and vomiting  - Zofran and Protonix ordered for symptoms -improved, tolerating full liquids   Chronic abdominal pain / Opioid dependence  - resume  home methadone  - IV dilaudid ordered for breakthru pain and discomfort    Sinus tachycardia  - secondary to hemorrhagic shock  - continue supportive measures  - improved   Moderate malnutrition -add supplements when able         Status is: Inpatient   Remains inpatient appropriate because: severity of illness requiring PRBC and hemodynamic instability               Family Communication:   spouse updated at bedside 08/14/21    Consultants:  GI   Code Status:  FULL    DVT Prophylaxis:  SCDs     Procedures: As Listed in Progress Note Above   Antibiotics: None    Subjective: Pt had some melena today.  Denies f/c, cp, n/v/d.  No abd pain.  No hematochezia  Objective: Vitals:   08/13/21 0828 08/13/21 1327 08/13/21 2220 08/14/21 1227  BP: 122/74 111/73 130/85 102/66  Pulse: 86 92 81 92  Resp: 18 16  18   Temp:  98.6 F (37 C) 98.5 F (36.9 C) 98.6 F (37 C)  TempSrc:  Oral Oral Oral  SpO2: 100% 100% 100% 100%  Weight:      Height:        Intake/Output Summary (Last 24 hours) at 08/14/2021 1713 Last data filed at 08/14/2021 0900 Gross per 24 hour  Intake 600 ml  Output --  Net 600 ml   Weight change:  Exam:  General:  Pt is alert, follows commands appropriately, not in acute distress HEENT: No icterus, No thrush, No neck mass, [place]/AT Cardiovascular: RRR, S1/S2, no rubs, no gallops Respiratory: CTA bilaterally, no wheezing, no crackles, no rhonchi Abdomen: Soft/+BS, non tender, non distended, no guarding Extremities: No edema, No lymphangitis, No petechiae, No rashes, no synovitis   Data Reviewed: I have personally reviewed following labs and imaging studies Basic Metabolic Panel: Recent Labs  Lab 08/09/21 0756 08/10/21 0418 08/11/21 0416  NA 136 135 136  K 4.1 3.9 3.7  CL 107 107 104  CO2 19* 23 26  GLUCOSE 257* 108* 108*  BUN 17 20 15   CREATININE 1.04 0.84 0.83  CALCIUM 7.2* 7.8* 8.0*  MG  --  1.9 1.9   Liver Function Tests: Recent Labs  Lab 08/09/21 0756 08/10/21 0418 08/11/21 0416  AST 18 13* 14*  ALT 13 9 11   ALKPHOS 45 44 50  BILITOT 0.4 1.0 0.6  PROT 4.9* 5.2* 5.7*  ALBUMIN 2.5* 2.7* 2.9*   No results for input(s): LIPASE, AMYLASE in the last 168 hours. No results for input(s): AMMONIA in the last 168 hours. Coagulation Profile: No results for input(s): INR, PROTIME in the last 168 hours. CBC: Recent Labs  Lab 08/09/21 0756 08/09/21 1504 08/10/21 0418  08/11/21 0416 08/11/21 1234 08/12/21 0426 08/12/21 1123 08/13/21 0438 08/14/21 0252 08/14/21 1143  WBC 10.3  --  10.6* 9.0  --  8.4  --  7.4 6.7  --   NEUTROABS 7.3  --  9.4* 7.2  --   --   --   --   --   --   HGB 5.6*   < > 8.4* 8.9*   < > 9.1* 9.1* 9.0* 8.4* 8.8*  HCT 18.7*   < > 25.9* 27.7*   < > 28.7* 28.5* 27.7* 27.2* 27.7*  MCV 63.6*  --  71.0* 72.5*  --  73.2*  --  72.5* 72.7*  --   PLT 331  --  189 197  --  207  --  220 221  --    < > = values in this interval not displayed.   Cardiac Enzymes: No results for input(s): CKTOTAL, CKMB, CKMBINDEX, TROPONINI in the last 168 hours. BNP: Invalid input(s): POCBNP CBG: Recent Labs  Lab 08/13/21 1650 08/14/21 0056 08/14/21 0612 08/14/21 1206 08/14/21 1614  GLUCAP 138* 112* 102* 112* 105*   HbA1C: No results for input(s): HGBA1C in the last 72 hours. Urine analysis:    Component Value Date/Time   COLORURINE YELLOW 05/03/2019 0325   APPEARANCEUR CLOUDY (A) 05/03/2019 0325   LABSPEC 1.039 (H) 05/03/2019 0325   PHURINE 7.0 05/03/2019 0325   GLUCOSEU NEGATIVE 05/03/2019 0325   HGBUR NEGATIVE 05/03/2019 0325   BILIRUBINUR NEGATIVE 05/03/2019 0325   KETONESUR NEGATIVE 05/03/2019 0325   PROTEINUR NEGATIVE 05/03/2019 0325   NITRITE NEGATIVE 05/03/2019 0325   LEUKOCYTESUR NEGATIVE 05/03/2019 0325   Sepsis Labs: @LABRCNTIP (procalcitonin:4,lacticidven:4) ) Recent Results (from the past 240 hour(s))  Resp Panel by RT-PCR (Flu A&B, Covid) Nasopharyngeal Swab     Status: None   Collection Time: 08/09/21  8:14 AM   Specimen: Nasopharyngeal Swab; Nasopharyngeal(NP) swabs in vial transport medium  Result Value Ref Range Status   SARS Coronavirus 2 by RT PCR NEGATIVE NEGATIVE Final    Comment: (NOTE) SARS-CoV-2 target nucleic acids are NOT DETECTED.  The SARS-CoV-2 RNA is generally detectable in upper respiratory specimens during the acute phase of infection. The lowest concentration of SARS-CoV-2 viral copies this assay can  detect is 138 copies/mL. A negative result does not preclude SARS-Cov-2 infection and should not be used as the sole basis for treatment or other patient management decisions. A negative result may occur with  improper specimen collection/handling, submission of specimen other than nasopharyngeal swab, presence of viral mutation(s) within the areas targeted by this assay, and inadequate number of viral copies(<138 copies/mL). A negative result must be combined with clinical observations, patient history, and epidemiological information. The expected result is Negative.  Fact Sheet for Patients:  EntrepreneurPulse.com.au  Fact Sheet for Healthcare Providers:  IncredibleEmployment.be  This test is no t yet approved or cleared by the [place] FDA and  has been authorized for detection and/or diagnosis of SARS-CoV-2 by FDA under an Emergency Use Authorization (EUA). This EUA will remain  in effect (meaning this test can be used) for the duration of the COVID-19 declaration under Section 564(b)(1) of the Act, 21 U.S.C.section 360bbb-3(b)(1), unless the authorization is terminated  or revoked sooner.       Influenza A by PCR NEGATIVE NEGATIVE Final   Influenza B by PCR NEGATIVE NEGATIVE Final    Comment: (NOTE) The Xpert Xpress SARS-CoV-2/FLU/RSV plus assay is intended as an aid in the diagnosis of influenza from Nasopharyngeal swab specimens and should not be used as a sole basis for treatment. Nasal washings and aspirates are unacceptable for Xpert Xpress SARS-CoV-2/FLU/RSV testing.  Fact Sheet for Patients: EntrepreneurPulse.com.au  Fact Sheet for Healthcare Providers: IncredibleEmployment.be  This test is not yet approved or cleared by the [place] FDA and has been authorized for detection and/or diagnosis of SARS-CoV-2 by FDA under an Emergency Use Authorization (EUA). This EUA will remain in  effect (meaning this test can be used) for the duration of the COVID-19 declaration under Section 564(b)(1) of the Act, 21 U.S.C. section 360bbb-3(b)(1), unless the authorization is terminated or revoked.  Performed at [facility], [address], [place], [place] [address]   MRSA Next Gen by PCR, Nasal  Status: None   Collection Time: 08/09/21 12:17 PM   Specimen: Nasal Mucosa; Nasal Swab  Result Value Ref Range Status   MRSA by PCR Next Gen NOT DETECTED NOT DETECTED Final    Comment: (NOTE) The GeneXpert MRSA Assay (FDA approved for NASAL specimens only), is one component of a comprehensive MRSA colonization surveillance program. It is not intended to diagnose MRSA infection nor to guide or monitor treatment for MRSA infections. Test performance is not FDA approved in patients less than 29 years old. Performed at [facility], [address] [place], [place] [address]      Scheduled Meds:  sodium chloride   Intravenous Once   Chlorhexidine Gluconate Cloth  6 each Topical Daily   feeding supplement  237 mL Oral TID BM   [START ON 08/15/2021] influenza vac split quadrivalent PF  0.5 mL Intramuscular Tomorrow-1000   methadone  5 mg Oral BID   multivitamin with minerals  1 tablet Oral Daily   pantoprazole  40 mg Intravenous Q12H   sucralfate  1 g Oral TID WC & HS   traZODone  50 mg Oral QHS   Continuous Infusions:  Procedures/Studies: DG Chest Port 1 View  Result Date: 08/09/2021 CLINICAL DATA:  Hematemesis.  History of esophageal cancer. EXAM: PORTABLE CHEST 1 VIEW COMPARISON:  Chest x-ray dated June 07, 2019. FINDINGS: Unchanged right chest wall port catheter. The heart size and mediastinal contours are within normal limits. Normal pulmonary vascularity. No focal consolidation, pleural effusion, or pneumothorax. No acute osseous abnormality. IMPRESSION: No active disease. Electronically Signed   By: Titus Dubin M.D.   On: 08/09/2021 08:15    Orson Eva, DO  [facility]  If 7PM-7AM, please contact night-coverage [email or web address] Password [email or web address] 08/14/2021, 5:13 PM   LOS: 5 days

## 2021-08-14 NOTE — Progress Notes (Signed)
Spoke with [facility] to set up transport for patient to come to there hospital. Made charge nurse aware. When called report to [facility] spoke with Jinny Blossom RN she stated she would check with the transport and call me back if there was an issue. Never received a call back on my shift. Reported to on-coming nurse.

## 2021-08-14 NOTE — Discharge Summary (Signed)
Physician Discharge Summary  Maxwell Henderson KDX:[id number] DOB: May 03, 1966 DOA: 08/09/2021  PCP: [name], FNP  Admit date: 08/09/2021 Discharge date: 08/14/2021  Admitted From: Home Disposition:  [facility]   Discharge Condition: Stable CODE STATUS: SOFT   Brief/Interim Summary: 56 y.o. 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  He went on to receive chemotherapy around 07/21/2021 and reports that he had been doing fairly well.  He was having intermittent constipation and was taking prune juice.  He reports that he had been eating normal foods which he was told he could do after his last EGD.  He reports that he had a melanotic stool yesterday.  He started to feel ill at about 4 AM where he became diaphoretic and very weak.  He reports that he woke up this morning feeling chills and having a cramping epigastric abdominal pain.  It was associated with nausea and he vomited a moderate amount of bright red blood.  He felt unwell.  EMS was called to the house.   ED Course: EMS found him in hemorrhagic shock, severely hypotensive with blood pressure 76/40, heart rate was 110 CBG was 268, they bolused him 1 L of normal saline and brought him to the emergency department.  His hemoglobin was noted to be down to 5.6 from 8.5 on 07/17/2021.  He was ordered to units emergency PRBC transfusion which improved his blood pressure and an additional 2 units  PRBC ordered.  He reports that he is vaccinated fully for COVID but has not received an influenza vaccine.  He reports that he did have a case of influenza A this season that he recovered from.  His SARS 2 coronavirus test and influenza testing was negative.  His chest x-ray showed no active disease.  After being resuscitated in the emergency department.   Discharge Diagnoses:  Acute hemorrhagic shock  - secondary to GI bleed from gastric mass - improved after emergent transfusion of 4 units PRBC - advanced diet to soft diet, which pt tolerated - BP improved after PRBC - hemodynamically stable > 72 hours   Hematemesis/ Upper GI Bleed - secondary to esophageal mass and gastric ulceration - appreciate GI consultation  - continue IV protonix infusion x 72 hours--end on 12/30 @0900  -12/30 transitioned to bid protonix - continue carafate - advanced to soft diet - 12/29 am--one small BM with small volume blood - 12/31 Hgb remains stable   Acute blood loss anemia  - transfused 4 units PRBC total - repeat H/H at noon--Hgb remains stable - recheck CBC in AM  - discussed with Dr. Harvie Heck at [facility] accept patient in transfer--pt currently on wait list   Hypotension  - improved with resuscitation efforts/PRBC - BP remains stable   Esophageal adenocarcinoma -follows Dr. Emeterio Reeve at [facility] -last [place] on 07/21/21, next planned in Jan 2023   Type 2 DM with hyperglycemia  - 07/17/21 A1C--5.6 - d/c SSI   GERD - protonix infusion ordered>>received 72 hours -12/30 transitioned to bid protonix   Nausea and vomiting  -  Zofran and Protonix ordered for symptoms -improved, tolerating full liquids   Chronic abdominal pain / Opioid dependence  - resume home methadone  - IV dilaudid ordered for breakthru pain and discomfort    Sinus tachycardia  - secondary to hemorrhagic shock  - continue supportive measures  - improved   Moderate malnutrition -add supplements when able       Discharge Instructions   Allergies as of 08/14/2021       Reactions   Ketamine And Related Other (See Comments)   intolerance        Medication List     TAKE these medications    famotidine 20 MG tablet Commonly known as: PEPCID Take 20 mg by mouth 2 (two) times daily.   gabapentin 300 MG capsule Commonly known as: NEURONTIN Take 300 mg by mouth 3 (three) times daily.   Lactulose 20 GM/30ML Soln Take 30 mLs (20 g total) by mouth 2 (two) times a day.   methadone 10 MG tablet Commonly known as: DOLOPHINE Take by mouth See admin instructions. Take 2.5 tablets by mouth 3 times a day   oxycodone 30 MG immediate release tablet Commonly known as: ROXICODONE Take 30 mg by mouth every 4 (four) hours as needed for pain.   pantoprazole 40 MG tablet Commonly known as: PROTONIX Take 1 tablet (40 mg total) by mouth 2 (two) times daily.   sucralfate 1 g tablet Commonly known as: CARAFATE Take 1 tablet (1 g total) by mouth 4 (four) times daily -  with meals and at bedtime.   traZODone 50 MG tablet Commonly known as: DESYREL Take 1 tablet (50 mg total) by mouth at bedtime.        Allergies  Allergen Reactions   Ketamine And Related Other (See Comments)    intolerance    Consultations: GI   Procedures/Studies: DG Chest Port 1 View  Result Date: 08/09/2021 CLINICAL DATA:  Hematemesis.  History of esophageal cancer. EXAM: PORTABLE CHEST 1 VIEW COMPARISON:  Chest x-ray dated June 07, 2019. FINDINGS: Unchanged right chest wall port catheter. The heart size and mediastinal contours are within normal limits. Normal pulmonary vascularity. No focal consolidation, pleural effusion, or pneumothorax. No acute osseous abnormality. IMPRESSION: No active disease. Electronically Signed   By: Titus Dubin M.D.   On: 08/09/2021 08:15        Discharge Exam: Vitals:   08/14/21 1227 08/14/21 1715  BP: 102/66 140/78  Pulse: 92 99  Resp: 18 18  Temp: 98.6 F (37 C) 98.8  F (37.1 C)  SpO2: 100% 100%   Vitals:   08/13/21 1327 08/13/21 2220 08/14/21 1227 08/14/21 1715  BP: 111/73 130/85 102/66 140/78  Pulse: 92 81 92 99  Resp: 16  18 18   Temp: 98.6 F (37 C) 98.5 F (36.9 C) 98.6 F (37 C) 98.8 F (37.1 C)  TempSrc: Oral Oral Oral Oral  SpO2: 100% 100% 100% 100%  Weight:      Height:        General: Pt is alert, awake, not in acute distress Cardiovascular: RRR, S1/S2 +, no rubs, no gallops Respiratory: CTA bilaterally, no wheezing, no rhonchi Abdominal: Soft, NT, ND, bowel sounds + Extremities: no edema, no cyanosis   The results of significant diagnostics from this hospitalization (including imaging, microbiology, ancillary and laboratory) are listed below for reference.    Significant Diagnostic Studies: DG Chest Port 1 View  Result Date: 08/09/2021 CLINICAL DATA:  Hematemesis.  History of esophageal cancer. EXAM: PORTABLE  CHEST 1 VIEW COMPARISON:  Chest x-ray dated June 07, 2019. FINDINGS: Unchanged right chest wall port catheter. The heart size and mediastinal contours are within normal limits. Normal pulmonary vascularity. No focal consolidation, pleural effusion, or pneumothorax. No acute osseous abnormality. IMPRESSION: No active disease. Electronically Signed   By: Titus Dubin M.D.   On: 08/09/2021 08:15    Microbiology: Recent Results (from the past 240 hour(s))  Resp Panel by RT-PCR (Flu A&B, Covid) Nasopharyngeal Swab     Status: None   Collection Time: 08/09/21  8:14 AM   Specimen: Nasopharyngeal Swab; Nasopharyngeal(NP) swabs in vial transport medium  Result Value Ref Range Status   SARS Coronavirus 2 by RT PCR NEGATIVE NEGATIVE Final    Comment: (NOTE) SARS-CoV-2 target nucleic acids are NOT DETECTED.  The SARS-CoV-2 RNA is generally detectable in upper respiratory specimens during the acute phase of infection. The lowest concentration of SARS-CoV-2 viral copies this assay can detect is 138 copies/mL. A negative  result does not preclude SARS-Cov-2 infection and should not be used as the sole basis for treatment or other patient management decisions. A negative result may occur with  improper specimen collection/handling, submission of specimen other than nasopharyngeal swab, presence of viral mutation(s) within the areas targeted by this assay, and inadequate number of viral copies(<138 copies/mL). A negative result must be combined with clinical observations, patient history, and epidemiological information. The expected result is Negative.  Fact Sheet for Patients:  EntrepreneurPulse.com.au  Fact Sheet for Healthcare Providers:  IncredibleEmployment.be  This test is no t yet approved or cleared by the [place] FDA and  has been authorized for detection and/or diagnosis of SARS-CoV-2 by FDA under an Emergency Use Authorization (EUA). This EUA will remain  in effect (meaning this test can be used) for the duration of the COVID-19 declaration under Section 564(b)(1) of the Act, 21 U.S.C.section 360bbb-3(b)(1), unless the authorization is terminated  or revoked sooner.       Influenza A by PCR NEGATIVE NEGATIVE Final   Influenza B by PCR NEGATIVE NEGATIVE Final    Comment: (NOTE) The Xpert Xpress SARS-CoV-2/FLU/RSV plus assay is intended as an aid in the diagnosis of influenza from Nasopharyngeal swab specimens and should not be used as a sole basis for treatment. Nasal washings and aspirates are unacceptable for Xpert Xpress SARS-CoV-2/FLU/RSV testing.  Fact Sheet for Patients: EntrepreneurPulse.com.au  Fact Sheet for Healthcare Providers: IncredibleEmployment.be  This test is not yet approved or cleared by the [place] FDA and has been authorized for detection and/or diagnosis of SARS-CoV-2 by FDA under an Emergency Use Authorization (EUA). This EUA will remain in effect (meaning this test can be used)  for the duration of the COVID-19 declaration under Section 564(b)(1) of the Act, 21 U.S.C. section 360bbb-3(b)(1), unless the authorization is terminated or revoked.  Performed at [facility], [address] [place], [place] [address]   MRSA Next Gen by PCR, Nasal     Status: None   Collection Time: 08/09/21 12:17 PM   Specimen: Nasal Mucosa; Nasal Swab  Result Value Ref Range Status   MRSA by PCR Next Gen NOT DETECTED NOT DETECTED Final    Comment: (NOTE) The GeneXpert MRSA Assay (FDA approved for NASAL specimens only), is one component of a comprehensive MRSA colonization surveillance program. It is not intended to diagnose MRSA infection nor to guide or monitor treatment for MRSA infections. Test performance is not FDA approved in patients less than 71 years old. Performed at [facility]  [address] [place], [place] [address]      Labs: Basic Metabolic Panel: Recent Labs  Lab 08/09/21 0756 08/10/21 0418 08/11/21 0416  NA 136 135 136  K 4.1 3.9 3.7  CL 107 107 104  CO2 19* 23 26  GLUCOSE 257* 108* 108*  BUN 17 20 15   CREATININE 1.04 0.84 0.83  CALCIUM 7.2* 7.8* 8.0*  MG  --  1.9 1.9   Liver Function Tests: Recent Labs  Lab 08/09/21 0756 08/10/21 0418 08/11/21 0416  AST 18 13* 14*  ALT 13 9 11   ALKPHOS 45 44 50  BILITOT 0.4 1.0 0.6  PROT 4.9* 5.2* 5.7*  ALBUMIN 2.5* 2.7* 2.9*   No results for input(s): LIPASE, AMYLASE in the last 168 hours. No results for input(s): AMMONIA in the last 168 hours. CBC: Recent Labs  Lab 08/09/21 0756 08/09/21 1504 08/10/21 0418 08/11/21 0416 08/11/21 1234 08/12/21 0426 08/12/21 1123 08/13/21 0438 08/14/21 0252 08/14/21 1143  WBC 10.3  --  10.6* 9.0  --  8.4  --  7.4 6.7  --   NEUTROABS 7.3  --  9.4* 7.2  --   --   --   --   --   --   HGB 5.6*   < > 8.4* 8.9*   < > 9.1* 9.1* 9.0* 8.4* 8.8*  HCT 18.7*   < > 25.9* 27.7*   < > 28.7* 28.5* 27.7* 27.2* 27.7*  MCV 63.6*  --  71.0* 72.5*  --  73.2*  --  72.5* 72.7*   --   PLT 331  --  189 197  --  207  --  220 221  --    < > = values in this interval not displayed.   Cardiac Enzymes: No results for input(s): CKTOTAL, CKMB, CKMBINDEX, TROPONINI in the last 168 hours. BNP: Invalid input(s): POCBNP CBG: Recent Labs  Lab 08/13/21 1650 08/14/21 0056 08/14/21 0612 08/14/21 1206 08/14/21 1614  GLUCAP 138* 112* 102* 112* 105*    Time coordinating discharge:  36 minutes  Signed:  Orson Eva, DO [facility] Pager: [phone] 08/14/2021, 5:47 PM

## 2021-08-15 NOTE — Plan of Care (Signed)
  Problem: Education: Goal: Ability to identify signs and symptoms of gastrointestinal bleeding will improve Outcome: Adequate for Discharge   Problem: Bowel/Gastric: Goal: Will show no signs and symptoms of gastrointestinal bleeding Outcome: Adequate for Discharge   Problem: Fluid Volume: Goal: Will show no signs and symptoms of excessive bleeding Outcome: Adequate for Discharge   Problem: Clinical Measurements: Goal: Complications related to the disease process, condition or treatment will be avoided or minimized Outcome: Adequate for Discharge   Problem: Education: Goal: Knowledge of General Education information will improve Description: Including pain rating scale, medication(s)/side effects and non-pharmacologic comfort measures Outcome: Adequate for Discharge   Problem: Health Behavior/Discharge Planning: Goal: Ability to manage health-related needs will improve Outcome: Adequate for Discharge   Problem: Clinical Measurements: Goal: Ability to maintain clinical measurements within normal limits will improve Outcome: Adequate for Discharge Goal: Will remain free from infection Outcome: Adequate for Discharge Goal: Diagnostic test results will improve Outcome: Adequate for Discharge Goal: Respiratory complications will improve Outcome: Adequate for Discharge Goal: Cardiovascular complication will be avoided Outcome: Adequate for Discharge   Problem: Activity: Goal: Risk for activity intolerance will decrease Outcome: Adequate for Discharge   Problem: Nutrition: Goal: Adequate nutrition will be maintained Outcome: Adequate for Discharge   Problem: Coping: Goal: Level of anxiety will decrease Outcome: Adequate for Discharge   Problem: Elimination: Goal: Will not experience complications related to bowel motility Outcome: Adequate for Discharge Goal: Will not experience complications related to urinary retention Outcome: Adequate for Discharge   Problem: Pain  Managment: Goal: General experience of comfort will improve Outcome: Adequate for Discharge   Problem: Safety: Goal: Ability to remain free from injury will improve Outcome: Adequate for Discharge   Problem: Skin Integrity: Goal: Risk for impaired skin integrity will decrease Outcome: Adequate for Discharge   

## 2021-08-15 NOTE — Progress Notes (Signed)
[facility] in to transport patient to [facility] given to [place] with EMTALA and medical necessity form. VSS, patient in left facility via stretcher in stable condition, alert and oriented x's 4. Denies pain or discomfort. Report called to Gwenith Spitz at [facility], patient will be admitted to room 613.

## 2021-11-19 ENCOUNTER — Encounter (HOSPITAL_COMMUNITY)
Admission: EM | Disposition: A | Discharge status: Home / Self Care | Payer: Self-pay | Source: Home / Self Care | Attending: Emergency Medicine

## 2021-11-19 ENCOUNTER — Emergency Department (HOSPITAL_COMMUNITY): Payer: Medicaid Other | Admitting: Anesthesiology

## 2021-11-19 ENCOUNTER — Ambulatory Visit (HOSPITAL_COMMUNITY)
Admission: EM | Admit: 2021-11-19 | Discharge: 2021-11-19 | Disposition: A | Discharge status: Home / Self Care | Payer: Medicaid Other | Attending: Emergency Medicine | Admitting: Emergency Medicine

## 2021-11-19 ENCOUNTER — Other Ambulatory Visit: Payer: Self-pay

## 2021-11-19 ENCOUNTER — Encounter (HOSPITAL_COMMUNITY): Payer: Self-pay | Admitting: Emergency Medicine

## 2021-11-19 DIAGNOSIS — T18128A Food in esophagus causing other injury, initial encounter: Secondary | ICD-10-CM | POA: Insufficient documentation

## 2021-11-19 DIAGNOSIS — C160 Malignant neoplasm of cardia: Secondary | ICD-10-CM | POA: Insufficient documentation

## 2021-11-19 DIAGNOSIS — E119 Type 2 diabetes mellitus without complications: Secondary | ICD-10-CM | POA: Diagnosis not present

## 2021-11-19 DIAGNOSIS — X58XXXA Exposure to other specified factors, initial encounter: Secondary | ICD-10-CM | POA: Insufficient documentation

## 2021-11-19 DIAGNOSIS — Z7984 Long term (current) use of oral hypoglycemic drugs: Secondary | ICD-10-CM | POA: Insufficient documentation

## 2021-11-19 DIAGNOSIS — Z87891 Personal history of nicotine dependence: Secondary | ICD-10-CM | POA: Insufficient documentation

## 2021-11-19 DIAGNOSIS — I10 Essential (primary) hypertension: Secondary | ICD-10-CM | POA: Insufficient documentation

## 2021-11-19 DIAGNOSIS — K219 Gastro-esophageal reflux disease without esophagitis: Secondary | ICD-10-CM | POA: Insufficient documentation

## 2021-11-19 DIAGNOSIS — C159 Malignant neoplasm of esophagus, unspecified: Secondary | ICD-10-CM

## 2021-11-19 HISTORY — PX: ESOPHAGOGASTRODUODENOSCOPY (EGD) WITH PROPOFOL: SHX5813

## 2021-11-19 LAB — BASIC METABOLIC PANEL
Anion gap: 7 (ref 5–15)
BUN: 8 mg/dL (ref 6–20)
CO2: 26 mmol/L (ref 22–32)
Calcium: 8.9 mg/dL (ref 8.9–10.3)
Chloride: 104 mmol/L (ref 98–111)
Creatinine, Ser: 0.95 mg/dL (ref 0.61–1.24)
GFR, Estimated: >60 mL/min (ref >60)
Glucose, Bld: 95 mg/dL (ref 70–99)
Potassium: 3.7 mmol/L (ref 3.5–5.1)
Sodium: 137 mmol/L (ref 135–145)

## 2021-11-19 LAB — CBC WITH DIFFERENTIAL/PLATELET
Abs Immature Granulocytes: 0 10*3/uL (ref 0.00–0.07)
Basophils Absolute: 0 10*3/uL (ref 0.0–0.1)
Basophils Relative: 1 %
Eosinophils Absolute: 0.1 10*3/uL (ref 0.0–0.5)
Eosinophils Relative: 3 %
HCT: 32.9 % — ABNORMAL LOW (ref 39.0–52.0)
Hemoglobin: 10.2 g/dL — ABNORMAL LOW (ref 13.0–17.0)
Immature Granulocytes: 0 %
Lymphocytes Relative: 26 %
Lymphs Abs: 0.7 10*3/uL (ref 0.7–4.0)
MCH: 19.4 pg — ABNORMAL LOW (ref 26.0–34.0)
MCHC: 31 g/dL (ref 30.0–36.0)
MCV: 62.7 fL — ABNORMAL LOW (ref 80.0–100.0)
Monocytes Absolute: 0.4 10*3/uL (ref 0.1–1.0)
Monocytes Relative: 14 %
Neutro Abs: 1.6 10*3/uL — ABNORMAL LOW (ref 1.7–7.7)
Neutrophils Relative %: 56 %
Platelets: 234 10*3/uL (ref 150–400)
RBC: 5.25 MIL/uL (ref 4.22–5.81)
RDW: 20.4 % — ABNORMAL HIGH (ref 11.5–15.5)
WBC: 2.8 10*3/uL — ABNORMAL LOW (ref 4.0–10.5)
nRBC: 0 % (ref 0.0–0.2)

## 2021-11-19 SURGERY — ESOPHAGOGASTRODUODENOSCOPY (EGD) WITH PROPOFOL
Anesthesia: General

## 2021-11-19 MED ORDER — LIDOCAINE HCL (PF) 2 % IJ SOLN
INTRAMUSCULAR | Status: AC
  Filled 2021-11-19: qty 5

## 2021-11-19 MED ORDER — ONDANSETRON HCL 4 MG/2ML IJ SOLN
INTRAMUSCULAR | Status: AC
  Filled 2021-11-19: qty 2

## 2021-11-19 MED ORDER — GLYCOPYRROLATE PF 0.2 MG/ML IJ SOSY
PREFILLED_SYRINGE | INTRAMUSCULAR | Status: DC | PRN
  Administered 2021-11-19: .2 mg via INTRAVENOUS

## 2021-11-19 MED ORDER — FENTANYL CITRATE (PF) 100 MCG/2ML IJ SOLN
INTRAMUSCULAR | Status: DC | PRN
  Administered 2021-11-19: 100 ug via INTRAVENOUS

## 2021-11-19 MED ORDER — GLUCAGON HCL RDNA (DIAGNOSTIC) 1 MG IJ SOLR
1.0000 mg | Freq: Once | INTRAMUSCULAR | Status: AC
  Administered 2021-11-19: 1 mg via INTRAVENOUS
  Filled 2021-11-19: qty 1

## 2021-11-19 MED ORDER — SUCCINYLCHOLINE CHLORIDE 200 MG/10ML IV SOSY
PREFILLED_SYRINGE | INTRAVENOUS | Status: AC
  Filled 2021-11-19: qty 10

## 2021-11-19 MED ORDER — PHENYLEPHRINE 40 MCG/ML (10ML) SYRINGE FOR IV PUSH (FOR BLOOD PRESSURE SUPPORT)
PREFILLED_SYRINGE | INTRAVENOUS | Status: DC | PRN
  Administered 2021-11-19: 120 ug via INTRAVENOUS
  Administered 2021-11-19: 80 ug via INTRAVENOUS

## 2021-11-19 MED ORDER — DEXAMETHASONE SODIUM PHOSPHATE 4 MG/ML IJ SOLN
INTRAMUSCULAR | Status: AC
  Filled 2021-11-19: qty 2

## 2021-11-19 MED ORDER — LIDOCAINE HCL (CARDIAC) PF 100 MG/5ML IV SOSY
PREFILLED_SYRINGE | INTRAVENOUS | Status: DC | PRN
  Administered 2021-11-19 (×2): 100 mg via INTRATRACHEAL

## 2021-11-19 MED ORDER — LACTATED RINGERS IV SOLN: INTRAVENOUS | Status: DC | PRN

## 2021-11-19 MED ORDER — FENTANYL CITRATE (PF) 100 MCG/2ML IJ SOLN
INTRAMUSCULAR | Status: AC
  Filled 2021-11-19: qty 2

## 2021-11-19 MED ORDER — SUCCINYLCHOLINE CHLORIDE 200 MG/10ML IV SOSY
PREFILLED_SYRINGE | INTRAVENOUS | Status: DC | PRN
  Administered 2021-11-19: 140 mg via INTRAVENOUS

## 2021-11-19 MED ORDER — DEXAMETHASONE SODIUM PHOSPHATE 4 MG/ML IJ SOLN
INTRAMUSCULAR | Status: DC | PRN
  Administered 2021-11-19: 8 mg via INTRAVENOUS

## 2021-11-19 MED ORDER — SODIUM CHLORIDE 0.9 % IV SOLN: INTRAVENOUS | Status: DC

## 2021-11-19 MED ORDER — PROPOFOL 10 MG/ML IV BOLUS
INTRAVENOUS | Status: AC
  Filled 2021-11-19: qty 20

## 2021-11-19 MED ORDER — PROPOFOL 10 MG/ML IV BOLUS
INTRAVENOUS | Status: DC | PRN
  Administered 2021-11-19: 250 mg via INTRAVENOUS

## 2021-11-19 NOTE — ED Triage Notes (Signed)
Pt to the ED with complaints of a food bolus stuck in his esophagus. Pt has esophageal CA and a history of this happening in the past. ? ?Pt had to have an EGD for removal during the last time this happened. ?  ?

## 2021-11-19 NOTE — Anesthesia Postprocedure Evaluation (Signed)
Anesthesia Post Note ? ?Patient: Duwayne Matters ? ?Procedure(s) Performed: ESOPHAGOGASTRODUODENOSCOPY (EGD) WITH PROPOFOL ? ?Patient location during evaluation: PACU ?Anesthesia Type: General ?Level of consciousness: awake and alert and oriented ?Pain management: pain level controlled ?Vital Signs Assessment: post-procedure vital signs reviewed and stable ?Respiratory status: spontaneous breathing, nonlabored ventilation and respiratory function stable ?Cardiovascular status: blood pressure returned to baseline and stable ?Postop Assessment: no apparent nausea or vomiting ?Anesthetic complications: no ? ? ?No notable events documented. ? ? ?Last Vitals:  ?Vitals:  ? 11/19/21 1620 11/19/21 1630  ?BP: (!) 131/99 (!) 131/97  ?Pulse: (!) 112 (!) 102  ?Resp: 18 13  ?Temp:    ?SpO2: 100% 100%  ?  ?Last Pain:  ?Vitals:  ? 11/19/21 1617  ?TempSrc:   ?PainSc: 3   ? ? ?  ?  ?  ?  ?  ?  ? ?Jamyra Zweig C Freeland Pracht ? ? ? ? ?

## 2021-11-19 NOTE — ED Provider Notes (Signed)
?[facility] ?Provider Note ? ? ?CSN: [id number] ?Arrival date & time: 11/19/21  1102 ? ?  ? ?History ? ?No chief complaint on file. ? ? ?Maxwell Henderson is a 56 y.o. male. ? ?HPI ? ?  ? ? ?Maxwell Henderson is a 56 y.o. male with past medical history significant for hypertension, anemia, esophageal adenocarcinoma, currently undergoing chemotherapy at [facility], presents to the Emergency Department complaining of sensation of food bolus of his throat.  States he was eating rice and hamburger yesterday around noon when he felt sensation of the food being stuck in his throat.  He has tried to dislodge the food by drinking water without relief.  He feels a pressure sensation of his uppermost chest area.  He has tried drinking water several times without relief states he vomits every time he tries to drink any fluids.  States this is happened in the past.  He last had an EGD in November.  He denies any shortness of breath or abdominal pain. ? ? ?Home Medications ?Prior to Admission medications   ?Medication Sig Start Date End Date Taking? Authorizing Provider  ?famotidine (PEPCID) 20 MG tablet Take 20 mg by mouth 2 (two) times daily. 05/22/19   [provider]  ?gabapentin (NEURONTIN) 300 MG capsule Take 300 mg by mouth 3 (three) times daily. 06/25/21   [provider]  ?Lactulose 20 GM/30ML SOLN Take 30 mLs (20 g total) by mouth 2 (two) times a day. 02/17/19   Derek Jack, MD  ?methadone (DOLOPHINE) 10 MG tablet Take by mouth See admin instructions. Take 2.5 tablets by mouth 3 times a day 07/15/21   [provider]  ?oxycodone (ROXICODONE) 30 MG immediate release tablet Take 30 mg by mouth every 4 (four) hours as needed for pain. 07/15/21   [provider]  ?pantoprazole (PROTONIX) 40 MG tablet Take 1 tablet (40 mg total) by mouth 2 (two) times daily. 07/17/21 10/15/21  Manuella Ghazi, Pratik D, DO  ?sucralfate (CARAFATE) 1 g tablet Take 1 tablet (1 g total) by mouth 4 (four)  times daily -  with meals and at bedtime. 07/17/21 08/16/21  Manuella Ghazi, Pratik D, DO  ?traZODone (DESYREL) 50 MG tablet Take 1 tablet (50 mg total) by mouth at bedtime. 08/14/21   Orson Eva, MD  ?   ? ?Allergies    ?Ketamine and related   ? ?Review of Systems   ?Review of Systems  ?Constitutional:  Negative for chills and fever.  ?HENT:  Positive for trouble swallowing.   ?Respiratory:  Negative for chest tightness and shortness of breath.   ?Cardiovascular:  Positive for chest pain.  ?Neurological:  Negative for syncope, weakness and numbness.  ?All other systems reviewed and are negative. ? ?Physical Exam ?Updated Vital Signs ?BP (!) 142/92 (BP Location: Right Arm)   Pulse 60   Temp 97.8 ?F (36.6 ?C) (Oral)   Resp 15   Ht '6\' 1"'$  (1.854 m)   Wt 90.7 kg   SpO2 100%   BMI 26.39 kg/m?  ?Physical Exam ?Vitals and nursing note reviewed.  ?Constitutional:   ?   General: He is not in acute distress. ?   Appearance: Normal appearance.  ?HENT:  ?   Mouth/Throat:  ?   Mouth: Mucous membranes are moist.  ?   Pharynx: Oropharynx is clear.  ?   Comments: Uvula midline nonedematous.  No foreign body seen ?Cardiovascular:  ?   Rate and Rhythm: Normal rate and regular rhythm.  ?  Pulses: Normal pulses.  ?Pulmonary:  ?   Effort: Pulmonary effort is normal. No respiratory distress.  ?   Breath sounds: Normal breath sounds.  ?Abdominal:  ?   Palpations: Abdomen is soft.  ?   Tenderness: There is no abdominal tenderness.  ?Skin: ?   General: Skin is warm.  ?   Capillary Refill: Capillary refill takes less than 2 seconds.  ?Neurological:  ?   General: No focal deficit present.  ?   Mental Status: He is alert.  ?   Sensory: No sensory deficit.  ?   Motor: No weakness.  ? ? ?ED Results / Procedures / Treatments   ?Labs ?(all labs ordered are listed, but only abnormal results are displayed) ?Labs Reviewed  ?CBC WITH DIFFERENTIAL/PLATELET - Abnormal; Notable for the following components:  ?    Result Value  ? WBC 2.8 (*)   ? Hemoglobin 10.2  (*)   ? HCT 32.9 (*)   ? MCV 62.7 (*)   ? MCH 19.4 (*)   ? RDW 20.4 (*)   ? All other components within normal limits  ?BASIC METABOLIC PANEL  ?CBC WITH DIFFERENTIAL/PLATELET  ?CBC WITH DIFFERENTIAL/PLATELET  ? ? ?EKG ?EKG Interpretation ? ?Date/Time:  Saturday November 19 2021 11:43:39 EDT ?Ventricular Rate:  64 ?PR Interval:  155 ?QRS Duration: 106 ?QT Interval:  395 ?QTC Calculation: 408 ?R Axis:   39 ?Text Interpretation: Sinus rhythm RSR' in V1 or V2, right VCD or RVH Since last tracing rate slower Otherwise no significant change Confirmed by Daleen Bo [id number]) on 11/19/2021 11:53:17 AM ? ?Radiology ?No results found. ? ?Procedures ?Procedures  ? ? ?Medications Ordered in ED ?Medications - No data to display ? ?ED Course/ Medical Decision Making/ A&P ?  ?                        ?Medical Decision Making ?Amount and/or Complexity of Data Reviewed ?Labs: ordered. ?ECG/medicine tests: ordered. ?Discussion of management or test interpretation with external provider(s): Consulted GI, Dr. Abbey Chatters who agrees to see patient in the ER and will likely perform endoscopy ? ?Risk ?Prescription drug management. ? ? ?Patient here with history of esophageal cancer currently undergoing chemotherapy at [facility] has completed course of radiation.   Prior history of food impaction and last EGD in November. ? ?Patient resting comfortably, no acute distress.  Handle secretions well.  Vital signs reviewed. ? ?He is attempted to drink water here unsuccessfully.  Will consult GI. ? ?Discussed findings with Dr. Abbey Chatters who will see patient in the ED.  Recommends giving glucagon.  Patient will likely undergo endoscopy ? ? ? ? ? ? ? ?Final Clinical Impression(s) / ED Diagnoses ?Final diagnoses:  ?Food impaction of esophagus, initial encounter  ? ? ?Rx / DC Orders ?ED Discharge Orders   ? ? None  ? ?  ? ? ?  ?Kem Parkinson, PA-C ?11/19/21 1533 ? ?  ?Daleen Bo, MD ?11/20/21 2113 ? ?

## 2021-11-19 NOTE — ED Notes (Signed)
Pt will be going to Dr. Abbey Chatters in about 30 minutes.  ?

## 2021-11-19 NOTE — Transfer of Care (Signed)
Immediate Anesthesia Transfer of Care Note ? ?Patient: Maxwell Henderson ? ?Procedure(s) Performed: ESOPHAGOGASTRODUODENOSCOPY (EGD) WITH PROPOFOL ? ?Patient Location: PACU ? ?Anesthesia Type:General ? ?Level of Consciousness: awake, alert  and oriented ? ?Airway & Oxygen Therapy: Patient Spontanous Breathing ? ?Post-op Assessment: Report given to RN and Post -op Vital signs reviewed and stable ? ?Post vital signs: Reviewed and stable ? ?Last Vitals:  ?Vitals Value Taken Time  ?BP 131/97 11/19/21 1630  ?Temp 36.7 ?C 11/19/21 1617  ?Pulse 101 11/19/21 1631  ?Resp 17 11/19/21 1631  ?SpO2 100 % 11/19/21 1631  ?Vitals shown include unvalidated device data. ? ?Last Pain:  ?Vitals:  ? 11/19/21 1617  ?TempSrc:   ?PainSc: 3   ?   ? ?Patients Stated Pain Goal: 7 (11/19/21 1617) ? ?Complications: No notable events documented. ?

## 2021-11-19 NOTE — Anesthesia Procedure Notes (Signed)
Procedure Name: Intubation ?Date/Time: 11/19/2021 3:48 PM ?Performed by: Denese Killings, MD ?Pre-anesthesia Checklist: Patient identified, Emergency Drugs available, Suction available and Patient being monitored ?Patient Re-evaluated:Patient Re-evaluated prior to induction ?Oxygen Delivery Method: Circle system utilized ?Preoxygenation: Pre-oxygenation with 100% oxygen ?Induction Type: IV induction and Rapid sequence ?Laryngoscope Size: Mac and 4 ?Grade View: Grade I ?Tube type: Oral ?Tube size: 7.5 mm ?Number of attempts: 1 ?Airway Equipment and Method: Stylet and Oral airway ?Placement Confirmation: ETT inserted through vocal cords under direct vision, positive ETCO2 and breath sounds checked- equal and bilateral ?Secured at: 26 cm ?Tube secured with: Tape (at lip) ?Dental Injury: Teeth and Oropharynx as per pre-operative assessment  ? ? ? ? ?

## 2021-11-19 NOTE — ED Notes (Signed)
Hard to swallow ?

## 2021-11-19 NOTE — ED Notes (Signed)
Dr. Abbey Chatters at bedside ?

## 2021-11-19 NOTE — Consult Note (Signed)
?Consulting  Provider: Kem Parkinson, PA ?Primary Care Physician:  System, Provider Not In ?Primary Gastroenterologist:  Dr. Truddie Crumble [facility]) ? ?Reason for Consultation: Food impaction ? ?HPI:  ?Maxwell Henderson is a 56 y.o. male with a past medical history of stage IV metastatic esophageal adenocarcinoma diagnosed in 2018, currently receiving palliative chemotherapy and radiation at [facility].  History of food impactions in the past.  States he was eating a cheeseburger yesterday which got stuck.  Has tried to resolve obstruction with liquids and has been unsuccessful.  Not tolerating any liquids including water at present without vomiting back up.  Tolerating secretions appears.  Given glucagon without improvement.  Does note some epigastric discomfort from heaving.  No shortness of breath. ? ?Past Medical History:  ?Diagnosis Date  ? Diabetes mellitus without complication ([place])   ? Hypertension   ? Iron deficiency anemia due to chronic blood loss 02/28/2017  ? Primary esophageal adenocarcinoma ([place]) 01/29/2017  ? ? ?Past Surgical History:  ?Procedure Laterality Date  ? ESOPHAGOGASTRODUODENOSCOPY  07/29/2021  ? focal stenosis at the GE junction s/p dilation. Malignant-appearing mass in proximal stomach just below GE junction.  ? ESOPHAGOGASTRODUODENOSCOPY (EGD) WITH PROPOFOL N/A 01/16/2017  ? Procedure: ESOPHAGOGASTRODUODENOSCOPY (EGD) WITH PROPOFOL;  Surgeon: [name], MD;  Location: [facility];  Service: Endoscopy;  Laterality: N/A;  730 ?  ? ESOPHAGOGASTRODUODENOSCOPY (EGD) WITH PROPOFOL N/A 11/01/2020  ? Procedure: ESOPHAGOGASTRODUODENOSCOPY (EGD) WITH PROPOFOL;  Surgeon: Daneil Dolin, MD;  Location: [facility] ENDO SUITE;  Service: Endoscopy;  Laterality: N/A;  ? ESOPHAGOGASTRODUODENOSCOPY (EGD) WITH PROPOFOL N/A 07/17/2021  ? malignant esophagea tumor at GE junction, oozing gastric ulcer felt to be malignant.  ? IR FLUORO GUIDE PORT INSERTION RIGHT  02/13/2017  ? IR US GUIDE VASC ACCESS RIGHT   02/13/2017  ? lipoma removal    ? PORTA CATH INSERTION Right 02/13/2017  ? SAVORY DILATION N/A 01/16/2017  ? Procedure: SAVORY DILATION;  Surgeon: [name], MD;  Location: [facility]ITE;  Service: Endoscopy;  Laterality: N/A;  ? ? ?Prior to Admission medications   ?Medication Sig Start Date End Date Taking? Authorizing Provider  ?famotidine (PEPCID) 20 MG tablet Take 20 mg by mouth 2 (two) times daily. 05/22/19   [provider]  ?gabapentin (NEURONTIN) 300 MG capsule Take 300 mg by mouth 3 (three) times daily. 06/25/21   [provider]  ?Lactulose 20 GM/30ML SOLN Take 30 mLs (20 g total) by mouth 2 (two) times a day. 02/17/19   Derek Jack, MD  ?methadone (DOLOPHINE) 10 MG tablet Take by mouth See admin instructions. Take 2.5 tablets by mouth 3 times a day 07/15/21   [provider]  ?oxycodone (ROXICODONE) 30 MG immediate release tablet Take 30 mg by mouth every 4 (four) hours as needed for pain. 07/15/21   [provider]  ?pantoprazole (PROTONIX) 40 MG tablet Take 1 tablet (40 mg total) by mouth 2 (two) times daily. 07/17/21 10/15/21  Manuella Ghazi, Pratik D, DO  ?sucralfate (CARAFATE) 1 g tablet Take 1 tablet (1 g total) by mouth 4 (four) times daily -  with meals and at bedtime. 07/17/21 08/16/21  Manuella Ghazi, Pratik D, DO  ?traZODone (DESYREL) 50 MG tablet Take 1 tablet (50 mg total) by mouth at bedtime. 08/14/21   Orson Eva, MD  ? ? ?No current facility-administered medications for this encounter.  ? ?Current Outpatient Medications  ?Medication Sig Dispense Refill  ? famotidine (PEPCID) 20 MG tablet Take 20 mg by mouth 2 (two)  times daily.    ? gabapentin (NEURONTIN) 300 MG capsule Take 300 mg by mouth 3 (three) times daily.    ? Lactulose 20 GM/30ML SOLN Take 30 mLs (20 g total) by mouth 2 (two) times a day. 450 mL 1  ? methadone (DOLOPHINE) 10 MG tablet Take by mouth See admin instructions. Take 2.5 tablets by mouth 3 times a day    ? oxycodone (ROXICODONE) 30 MG immediate release  tablet Take 30 mg by mouth every 4 (four) hours as needed for pain.    ? pantoprazole (PROTONIX) 40 MG tablet Take 1 tablet (40 mg total) by mouth 2 (two) times daily. 180 tablet 2  ? sucralfate (CARAFATE) 1 g tablet Take 1 tablet (1 g total) by mouth 4 (four) times daily -  with meals and at bedtime. 120 tablet 0  ? traZODone (DESYREL) 50 MG tablet Take 1 tablet (50 mg total) by mouth at bedtime.    ? ? ?Allergies as of 11/19/2021 - Review Complete 11/19/2021  ?Allergen Reaction Noted  ? Ketamine and related Other (See Comments) 05/10/2019  ? ? ?Family History  ?Problem Relation Age of Onset  ? Prostate cancer Father   ? Colon cancer Neg Hx   ? Colon polyps Neg Hx   ? ? ?Social History  ? ?Socioeconomic History  ? Marital status: Married  ?  Spouse name: Not on file  ? Number of children: Not on file  ? Years of education: Not on file  ? Highest education level: Not on file  ?Occupational History  ? Not on file  ?Tobacco Use  ? Smoking status: Former  ?  Packs/day: 0.25  ?  Types: Cigarettes  ? Smokeless tobacco: Never  ?Vaping Use  ? Vaping Use: Never used  ?Substance and Sexual Activity  ? Alcohol use: No  ?  Comment: hx heavy alcohol, hasn't drank in 28 yrs  ? Drug use: Yes  ?  Frequency: 2.0 times per week  ?  Types: Marijuana  ? Sexual activity: Yes  ?Other Topics Concern  ? Not on file  ?Social History Narrative  ? WORKING [employer].  ? ?Social Determinants of Health  ? ?Financial Resource Strain: Not on file  ?Food Insecurity: Not on file  ?Transportation Needs: Not on file  ?Physical Activity: Not on file  ?Stress: Not on file  ?Social Connections: Not on file  ?Intimate Partner Violence: Not on file  ? ? ?Review of Systems: ?General: Negative for anorexia, weight loss, fever, chills, fatigue, weakness. ?Eyes: Negative for vision changes.  ?ENT: Negative for hoarseness, difficulty swallowing , nasal congestion. ?CV: Negative for chest pain, angina, palpitations, dyspnea on exertion,  peripheral edema.  ?Respiratory: Negative for dyspnea at rest, dyspnea on exertion, cough, sputum, wheezing.  ?GI: See history of present illness. ?GU:  Negative for dysuria, hematuria, urinary incontinence, urinary frequency, nocturnal urination.  ?MS: Negative for joint pain, low back pain.  ?Derm: Negative for rash or itching.  ?Neuro: Negative for weakness, abnormal sensation, seizure, frequent headaches, memory loss, confusion.  ?Psych: Negative for anxiety, depression ?Endo: Negative for unusual weight change.  ?Heme: Negative for bruising or bleeding. ?Allergy: Negative for rash or hives. ? ?Physical Exam: ?Vital signs in last 24 hours: ?Temp:  [97.8 ?F (36.6 ?C)] 97.8 ?F (36.6 ?C) (04/08 1112) ?Pulse Rate:  [60-62] 62 (04/08 1149) ?Resp:  [12-15] 12 (04/08 1149) ?BP: (142)/(92-95) 142/95 (04/08 1149) ?SpO2:  [100 %] 100 % (04/08 1149) ?Weight:  [90.7 kg]  90.7 kg (04/08 1114) ?  ?General:   Alert,  Well-developed, well-nourished, pleasant and cooperative in NAD ?Head:  Normocephalic and atraumatic. ?Eyes:  Sclera clear, no icterus.   Conjunctiva pink. ?Ears:  Normal auditory acuity. ?Nose:  No deformity, discharge,  or lesions. ?Mouth:  No deformity or lesions, dentition normal. ?Neck:  Supple; no masses or thyromegaly. ?Lungs:  Clear throughout to auscultation.   No wheezes, crackles, or rhonchi. No acute distress. ?Heart:  Regular rate and rhythm; no murmurs, clicks, rubs,  or gallops. ?Abdomen:  Soft, nontender and nondistended. No masses, hepatosplenomegaly or hernias noted. Normal bowel sounds, without guarding, and without rebound.   ?Msk:  Symmetrical without gross deformities. Normal posture. ?Pulses:  Normal pulses noted. ?Extremities:  Without clubbing or edema. ?Neurologic:  Alert and  oriented x4;  grossly normal neurologically. ?Skin:  Intact without significant lesions or rashes. ?Cervical Nodes:  No significant cervical adenopathy. ?Psych:  Alert and cooperative. Normal mood and  affect. ? ?Intake/Output from previous day: ?No intake/output data recorded. ?Intake/Output this shift: ?No intake/output data recorded. ? ?Lab Results: ?No results for input(s): WBC, HGB, HCT, PLT in the last 72 hours. ?BME

## 2021-11-19 NOTE — Anesthesia Preprocedure Evaluation (Addendum)
Anesthesia Evaluation  ?Patient identified by MRN, date of birth, ID band ?Patient awake ? ? ? ?Reviewed: ?Allergy & Precautions, NPO status , Patient's Chart, lab work & pertinent test results ? ?History of Anesthesia Complications ?(+) PROLONGED EMERGENCE and history of anesthetic complications ? ?Airway ?Mallampati: II ? ?TM Distance: >3 FB ?Neck ROM: Full ? ? ? Dental ? ?(+) Dental Advisory Given, Missing ?  ?Pulmonary ?pneumonia, former smoker,  ?  ?Pulmonary exam normal ?breath sounds clear to auscultation ? ? ? ? ? ? Cardiovascular ?Exercise Tolerance: Good ?hypertension, Pt. on medications ?Normal cardiovascular exam ?Rhythm:Regular Rate:Normal ? ? ?  ?Neuro/Psych ?negative neurological ROS ? negative psych ROS  ? GI/Hepatic ?Neg liver ROS, PUD, GERD  Medicated and Controlled,Esophageal cancer ?  ?Endo/Other  ?negative endocrine ROSdiabetes, Well Controlled, Type 2, Oral Hypoglycemic Agents ? Renal/GU ?negative Renal ROS  ?negative genitourinary ?  ?Musculoskeletal ?negative musculoskeletal ROS ?(+)  ? Abdominal ?  ?Peds ?negative pediatric ROS ?(+)  Hematology ? ?(+) Blood dyscrasia, anemia ,   ?Anesthesia Other Findings ? ? Reproductive/Obstetrics ?negative OB ROS ? ?  ? ? ? ? ? ? ? ? ? ? ? ? ? ?  ?  ? ? ? ? ? ? ?Anesthesia Physical ?Anesthesia Plan ? ?ASA: 3 and emergent ? ?Anesthesia Plan: General  ? ?Post-op Pain Management: Minimal or no pain anticipated  ? ?Induction: Intravenous, Rapid sequence and Cricoid pressure planned ? ?PONV Risk Score and Plan: Ondansetron and Dexamethasone ? ?Airway Management Planned: Video Laryngoscope Planned ? ?Additional Equipment:  ? ?Intra-op Plan:  ? ?Post-operative Plan: Extubation in OR ? ?Informed Consent: I have reviewed the patients History and Physical, chart, labs and discussed the procedure including the risks, benefits and alternatives for the proposed anesthesia with the patient or authorized representative who has indicated  his/her understanding and acceptance.  ? ? ? ? ? ?Plan Discussed with: CRNA and Surgeon ? ?Anesthesia Plan Comments:   ? ? ? ? ? ? ?Anesthesia Quick Evaluation ? ?

## 2021-11-19 NOTE — Op Note (Signed)
[facility] ?Patient Name: Maxwell Henderson ?Procedure Date: 11/19/2021 3:11 PM ?MRN: [id number] ?Date of Birth: 1966-02-03 ?Attending MD: Elon Alas. Abbey Chatters , DO ?CSN: [id number] ?Age: 56 ?Admit Type: Outpatient ?Procedure:                Upper GI endoscopy ?Indications:              Foreign body in the esophagus ?Providers:                Elon Alas. Abbey Chatters, DO, Lurline Del, RN, Ulice Dash  ?                          Blima Singer, Technician ?Referring MD:              ?Medicines:                General Anesthesia ?Complications:            No immediate complications. ?Estimated Blood Loss:     Estimated blood loss was minimal. ?Procedure:                Pre-Anesthesia Assessment: ?                          - The anesthesia plan was to use monitored  ?                          anesthesia care (MAC). ?                          After obtaining informed consent, the endoscope was  ?                          passed under direct vision. Throughout the  ?                          procedure, the patient's blood pressure, pulse, and  ?                          oxygen saturations were monitored continuously. The  ?                          GIF-H190 ([id number]) scope was introduced through the  ?                          mouth, and advanced to the second part of duodenum.  ?                          The upper GI endoscopy was accomplished without  ?                          difficulty. The patient tolerated the procedure  ?                          well. ?Scope In: 3:53:45 PM ?Scope Out: 3:57:31 PM ?Total Procedure Duration: 0 hours 3 minutes 46 seconds  ?Findings: ?     A submucosal mass with no bleeding and no stigmata of recent bleeding  ?  was found at the gastroesophageal junction. The mass was partially  ?     obstructing and circumferential with associated tight stricture.  ?     Dilation with mucosal disruption was performed simply by advancing  ?     endoscope through stricture. Food debris proximal to stricture was  ?      removed with roth net. ?     The entire examined stomach was normal. ?     The duodenal bulb, first portion of the duodenum and second portion of  ?     the duodenum were normal. ?     Compared to EGD in December, GEJ area looks improved. No gastric  ?     ulceration seen. 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     treatments as scope passed easily on EGD in December 2022. ?     Food was found in the middle third of the esophagus. Removal of food was  ?     accomplished with roth net. ?Impression:               - Partially obstructing, malignant esophageal tumor  ?                          was found at the gastroesophageal junction. ?                          - Normal stomach. ?                          - Normal duodenal bulb, first portion of the  ?                          duodenum and second portion of the duodenum. ?                          - Food in the middle third of the esophagus.  ?                          Removal was successful. ?Moderate Sedation: ?     Per Anesthesia Care ?Recommendation:           - Patient has a contact number available for  ?                          emergencies. The signs and symptoms of potential  ?                          delayed complications were discussed with the  ?                          patient. Return to normal activities tomorrow.  ?                          Written discharge instructions were provided to the  ?                          patient. ?                          -  Mechanical soft diet. ?                          - Use a proton pump inhibitor PO BID. ?                          - Follow up with Primary GI at [facility]. May  ?                          benefit from subsequent dilations of esophageal  ?                          stricture. ?Procedure Code(s):        --- Professional --- ?                          [phone], Esophagogastroduodenoscopy, flexible,  ?                          transoral; with removal of foreign body(s) ?Diagnosis  Code(s):        --- Professional --- ?                          C16.0, Malignant neoplasm of cardia ?                          I71.245Y, Food in esophagus causing other injury,  ?                          initial encounter ?                          T18.108A, Unspecified foreign body in esophagus  ?                          causing other injury, initial encounter ?CPT copyright 2019 [employer] Association. All rights reserved. ?The codes documented in this report are preliminary and upon coder review may  ?be revised to meet current compliance requirements. ?Elon Alas. Abbey Chatters, DO ?Elon Alas. Yaak, DO ?11/19/2021 4:09:44 PM ?This report has been signed electronically. ?Number of Addenda: 0 ?

## 2021-11-19 NOTE — Discharge Instructions (Addendum)
EGD ?Discharge instructions ?Please read the instructions outlined below and refer to this sheet in the next few weeks. These discharge instructions provide you with general information on caring for yourself after you leave the hospital. Your doctor may also give you specific instructions. While your treatment has been planned according to the most current medical practices available, unavoidable complications occasionally occur. If you have any problems or questions after discharge, please call your doctor. ?ACTIVITY ?You may resume your regular activity but move at a slower pace for the next 24 hours.  ?Take frequent rest periods for the next 24 hours.  ?Walking will help expel (get rid of) the air and reduce the bloated feeling in your abdomen.  ?No driving for 24 hours (because of the anesthesia (medicine) used during the test).  ?You may shower.  ?Do not sign any important legal documents or operate any machinery for 24 hours (because of the anesthesia used during the test).  ?NUTRITION ?Drink plenty of fluids.  ?You may resume your normal diet.  ?Begin with a light meal and progress to your normal diet.  ?Avoid alcoholic beverages for 24 hours or as instructed by your caregiver.  ?MEDICATIONS ?You may resume your normal medications unless your caregiver tells you otherwise.  ?WHAT YOU CAN EXPECT TODAY ?You may experience abdominal discomfort such as a feeling of fullness or ?gas? pains.  ?FOLLOW-UP ?Your doctor will discuss the results of your test with you.  ?SEEK IMMEDIATE MEDICAL ATTENTION IF ANY OF THE FOLLOWING OCCUR: ?Excessive nausea (feeling sick to your stomach) and/or vomiting.  ?Severe abdominal pain and distention (swelling).  ?Trouble swallowing.  ?Temperature over 101? F (37.8? C).  ?Rectal bleeding or vomiting of blood.  ? ?You did have food debris in your esophagus which I removed successfully today.  Compared to pictures in December, the esophageal cancer actually does look improved.  You  have developed a tight stricture in your esophagus which is possibly due to radiation treatments.  By advancing the endoscope through this area, I did dilate it.  Would keep your diet confined to full liquids and chopping up foods into very small pieces, eating slowly, drink plenty of water with meals.  Follow-up with your primary gastroenterologist at [facility] as you may benefit from further dilation of this newfound stricture.  Continue on pantoprazole twice daily. ? ? ?I hope you have a great rest of your week! ? ?Elon Alas. Abbey Chatters, D.O. ?Gastroenterology and Hepatology ?[facility] ? ?

## 2021-11-23 ENCOUNTER — Encounter (HOSPITAL_COMMUNITY): Payer: Self-pay | Admitting: Internal Medicine

## 2021-11-30 ENCOUNTER — Encounter (HOSPITAL_COMMUNITY): Payer: Self-pay | Admitting: Internal Medicine

## 2022-01-21 ENCOUNTER — Other Ambulatory Visit: Payer: Self-pay | Admitting: Nurse Practitioner

## 2022-02-10 ENCOUNTER — Other Ambulatory Visit: Payer: Self-pay

## 2022-02-10 ENCOUNTER — Encounter (HOSPITAL_COMMUNITY)
Admission: EM | Disposition: A | Discharge status: Home / Self Care | Payer: Self-pay | Source: Home / Self Care | Attending: Emergency Medicine

## 2022-02-10 ENCOUNTER — Emergency Department (HOSPITAL_COMMUNITY): Payer: Medicaid Other | Admitting: Anesthesiology

## 2022-02-10 ENCOUNTER — Ambulatory Visit (HOSPITAL_COMMUNITY)
Admission: EM | Admit: 2022-02-10 | Discharge: 2022-02-10 | Disposition: A | Discharge status: Home / Self Care | Payer: Medicaid Other | Attending: Emergency Medicine | Admitting: Emergency Medicine

## 2022-02-10 ENCOUNTER — Encounter (HOSPITAL_COMMUNITY): Payer: Self-pay

## 2022-02-10 DIAGNOSIS — C159 Malignant neoplasm of esophagus, unspecified: Secondary | ICD-10-CM | POA: Diagnosis not present

## 2022-02-10 DIAGNOSIS — Z79899 Other long term (current) drug therapy: Secondary | ICD-10-CM | POA: Diagnosis not present

## 2022-02-10 DIAGNOSIS — T18128A Food in esophagus causing other injury, initial encounter: Secondary | ICD-10-CM | POA: Diagnosis not present

## 2022-02-10 DIAGNOSIS — E119 Type 2 diabetes mellitus without complications: Secondary | ICD-10-CM | POA: Insufficient documentation

## 2022-02-10 DIAGNOSIS — K222 Esophageal obstruction: Secondary | ICD-10-CM | POA: Insufficient documentation

## 2022-02-10 DIAGNOSIS — Z923 Personal history of irradiation: Secondary | ICD-10-CM | POA: Diagnosis not present

## 2022-02-10 DIAGNOSIS — T189XXA Foreign body of alimentary tract, part unspecified, initial encounter: Secondary | ICD-10-CM | POA: Diagnosis present

## 2022-02-10 DIAGNOSIS — K219 Gastro-esophageal reflux disease without esophagitis: Secondary | ICD-10-CM | POA: Diagnosis not present

## 2022-02-10 DIAGNOSIS — X58XXXA Exposure to other specified factors, initial encounter: Secondary | ICD-10-CM | POA: Insufficient documentation

## 2022-02-10 DIAGNOSIS — Z87891 Personal history of nicotine dependence: Secondary | ICD-10-CM | POA: Diagnosis not present

## 2022-02-10 DIAGNOSIS — I10 Essential (primary) hypertension: Secondary | ICD-10-CM | POA: Insufficient documentation

## 2022-02-10 DIAGNOSIS — Z9221 Personal history of antineoplastic chemotherapy: Secondary | ICD-10-CM | POA: Insufficient documentation

## 2022-02-10 HISTORY — PX: ESOPHAGOGASTRODUODENOSCOPY (EGD) WITH PROPOFOL: SHX5813

## 2022-02-10 LAB — BASIC METABOLIC PANEL
Anion gap: 4 — ABNORMAL LOW (ref 5–15)
BUN: 8 mg/dL (ref 6–20)
CO2: 26 mmol/L (ref 22–32)
Calcium: 8.5 mg/dL — ABNORMAL LOW (ref 8.9–10.3)
Chloride: 108 mmol/L (ref 98–111)
Creatinine, Ser: 0.92 mg/dL (ref 0.61–1.24)
GFR, Estimated: >60 mL/min (ref >60)
Glucose, Bld: 109 mg/dL — ABNORMAL HIGH (ref 70–99)
Potassium: 3.5 mmol/L (ref 3.5–5.1)
Sodium: 138 mmol/L (ref 135–145)

## 2022-02-10 SURGERY — ESOPHAGOGASTRODUODENOSCOPY (EGD) WITH PROPOFOL
Anesthesia: General

## 2022-02-10 MED ORDER — LACTATED RINGERS IV SOLN: INTRAVENOUS | Status: DC

## 2022-02-10 MED ORDER — FENTANYL CITRATE (PF) 100 MCG/2ML IJ SOLN
INTRAMUSCULAR | Status: DC | PRN
  Administered 2022-02-10 (×2): 50 ug via INTRAVENOUS

## 2022-02-10 MED ORDER — GLUCAGON HCL RDNA (DIAGNOSTIC) 1 MG IJ SOLR
1.0000 mg | Freq: Once | INTRAMUSCULAR | Status: AC
  Administered 2022-02-10: 1 mg via INTRAVENOUS
  Filled 2022-02-10: qty 1

## 2022-02-10 MED ORDER — SODIUM CHLORIDE 0.9 % IV BOLUS
500.0000 mL | Freq: Once | INTRAVENOUS | Status: AC
  Administered 2022-02-10: 500 mL via INTRAVENOUS

## 2022-02-10 MED ORDER — FENTANYL CITRATE (PF) 100 MCG/2ML IJ SOLN
INTRAMUSCULAR | Status: AC
  Filled 2022-02-10: qty 2

## 2022-02-10 MED ORDER — PROPOFOL 10 MG/ML IV BOLUS
INTRAVENOUS | Status: DC | PRN
  Administered 2022-02-10: 140 mg via INTRAVENOUS

## 2022-02-10 MED ORDER — PROPOFOL 10 MG/ML IV BOLUS
INTRAVENOUS | Status: AC
  Filled 2022-02-10: qty 20

## 2022-02-10 NOTE — Discharge Instructions (Addendum)
EGD Discharge instructions Please read the instructions outlined below and refer to this sheet in the next few weeks. These discharge instructions provide you with general information on caring for yourself after you leave the hospital. Your doctor may also give you specific instructions. While your treatment has been planned according to the most current medical practices available, unavoidable complications occasionally occur. If you have any problems or questions after discharge, please call your doctor. ACTIVITY You may resume your regular activity but move at a slower pace for the next 24 hours.  Take frequent rest periods for the next 24 hours.  Walking will help expel (get rid of) the air and reduce the bloated feeling in your abdomen.  No driving for 24 hours (because of the anesthesia (medicine) used during the test).  You may shower.  Do not sign any important legal documents or operate any machinery for 24 hours (because of the anesthesia used during the test).  NUTRITION Drink plenty of fluids.  You may resume your normal diet.  Begin with a light meal and progress to your normal diet.  Avoid alcoholic beverages for 24 hours or as instructed by your caregiver.  MEDICATIONS You may resume your normal medications unless your caregiver tells you otherwise.  WHAT YOU CAN EXPECT TODAY You may experience abdominal discomfort such as a feeling of fullness or "gas" pains.  FOLLOW-UP Your doctor will discuss the results of your test with you.  SEEK IMMEDIATE MEDICAL ATTENTION IF ANY OF THE FOLLOWING OCCUR: Excessive nausea (feeling sick to your stomach) and/or vomiting.  Severe abdominal pain and distention (swelling).  Trouble swallowing.  Temperature over 101 F (37.8 C).  Rectal bleeding or vomiting of blood.   I again saw your esophageal cancer with associated tight stricture.  I again dilated the stricture just by passing the endoscope through the lesion.  You did have evidence  of egg proximal to the stricture which I removed today.  Keep your diet soft.  Drink plenty of water.  Continue on pantoprazole twice daily.  [facility]-up [facility].  You may benefit from subsequent dilations.   I hope you have a great rest of your week!  Elon Alas. Abbey Chatters, D.O. Gastroenterology and Hepatology [facility]\

## 2022-02-10 NOTE — Anesthesia Preprocedure Evaluation (Signed)
Anesthesia Evaluation  Patient identified by MRN, date of birth, ID band Patient awake    Reviewed: Allergy & Precautions, H&P , NPO status , Patient's Chart, lab work & pertinent test results, reviewed documented beta blocker date and time   Airway Mallampati: II  TM Distance: >3 FB Neck ROM: full    Dental no notable dental hx.    Pulmonary neg pulmonary ROS, former smoker,    Pulmonary exam normal breath sounds clear to auscultation       Cardiovascular Exercise Tolerance: Good hypertension, negative cardio ROS   Rhythm:regular Rate:Normal     Neuro/Psych negative neurological ROS  negative psych ROS   GI/Hepatic Neg liver ROS, PUD, GERD  Medicated,  Endo/Other  negative endocrine ROSdiabetes, Type 2  Renal/GU negative Renal ROS  negative genitourinary   Musculoskeletal   Abdominal   Peds  Hematology  (+) Blood dyscrasia, anemia ,   Anesthesia Other Findings   Reproductive/Obstetrics negative OB ROS                             Anesthesia Physical  Anesthesia Plan  ASA: 4 and emergent  Anesthesia Plan: General and General ETT   Post-op Pain Management:    Induction:   PONV Risk Score and Plan: Ondansetron  Airway Management Planned:   Additional Equipment:   Intra-op Plan:   Post-operative Plan:   Informed Consent: I have reviewed the patients History and Physical, chart, labs and discussed the procedure including the risks, benefits and alternatives for the proposed anesthesia with the patient or authorized representative who has indicated his/her understanding and acceptance.     Dental Advisory Given  Plan Discussed with: CRNA  Anesthesia Plan Comments:         Anesthesia Quick Evaluation

## 2022-02-10 NOTE — ED Triage Notes (Signed)
Pt presents to ED with food impaction. Pt states started about 1230-1300, states he can't get anything to pass.

## 2022-02-10 NOTE — ED Notes (Signed)
Endo arrived in ED to transport Pt to Endo.

## 2022-02-10 NOTE — H&P (View-Only) (Signed)
Consulting  Provider: Dr. Melina Copa Primary Care Physician:  Patient, No Pcp Per Primary Gastroenterologist:  Dr. Truddie Crumble [facility])  Reason for Consultation: Food impaction  HPI:  Maxwell Henderson is a 56 y.o. male with a past medical history of stage IV metastatic esophageal adenocarcinoma diagnosed in 2018, currently receiving palliative chemotherapy and radiation at [facility].  History of food impactions in the past.  States he was eating a bacon and eggs this morning which got stuck.  Has tried to resolve obstruction with liquids and has been unsuccessful.  Not tolerating any liquids including water at present without vomiting back up.  Tolerating secretions appears.  Given glucagon without improvement.  Does note some epigastric discomfort.  No shortness of breath.  Past Medical History:  Diagnosis Date   Diabetes mellitus without complication ([place])    Hypertension    Iron deficiency anemia due to chronic blood loss 02/28/2017   Primary esophageal adenocarcinoma ([place]) 01/29/2017    Past Surgical History:  Procedure Laterality Date   ESOPHAGOGASTRODUODENOSCOPY  07/29/2021   focal stenosis at the GE junction s/p dilation. Malignant-appearing mass in proximal stomach just below GE junction.   ESOPHAGOGASTRODUODENOSCOPY (EGD) WITH PROPOFOL N/A 01/16/2017   Procedure: ESOPHAGOGASTRODUODENOSCOPY (EGD) WITH PROPOFOL;  Surgeon: [name], MD;  [facility]: [facility];  Service: Endoscopy;  Laterality: N/A;  730    ESOPHAGOGASTRODUODENOSCOPY (EGD) WITH PROPOFOL N/A 11/01/2020   Procedure: ESOPHAGOGASTRODUODENOSCOPY (EGD) WITH PROPOFOL;  Surgeon: Daneil Dolin, MD;  Location: [facility];  Service: Endoscopy;  Laterality: N/A;   ESOPHAGOGASTRODUODENOSCOPY (EGD) WITH PROPOFOL N/A 07/17/2021   malignant esophagea tumor at GE junction, oozing gastric ulcer felt to be malignant.   ESOPHAGOGASTRODUODENOSCOPY (EGD) WITH PROPOFOL N/A 11/19/2021   Procedure: ESOPHAGOGASTRODUODENOSCOPY (EGD)  WITH PROPOFOL;  Surgeon: Eloise Harman, DO;  Location: [facility];  Service: Endoscopy;  Laterality: N/A;   IR FLUORO GUIDE PORT INSERTION RIGHT  02/13/2017   IR US GUIDE VASC ACCESS RIGHT  02/13/2017   lipoma removal     PORTA CATH INSERTION Right 02/13/2017   SAVORY DILATION N/A 01/16/2017   Procedure: SAVORY DILATION;  Surgeon: [name], MD;  Location: [facility];  Service: Endoscopy;  Laterality: N/A;    Prior to Admission medications   Medication Sig Start Date End Date Taking? Authorizing Provider  famotidine (PEPCID) 20 MG tablet Take 20 mg by mouth 2 (two) times daily. 05/22/19   [provider]  gabapentin (NEURONTIN) 300 MG capsule Take 300 mg by mouth 3 (three) times daily. 06/25/21   [provider]  Lactulose 20 GM/30ML SOLN Take 30 mLs (20 g total) by mouth 2 (two) times a day. 02/17/19   Derek Jack, MD  methadone (DOLOPHINE) 10 MG tablet Take by mouth See admin instructions. Take 2.5 tablets by mouth 3 times a day 07/15/21   [provider]  oxycodone (ROXICODONE) 30 MG immediate release tablet Take 30 mg by mouth every 4 (four) hours as needed for pain. 07/15/21   [provider]  pantoprazole (PROTONIX) 40 MG tablet Take 1 tablet (40 mg total) by mouth 2 (two) times daily. 07/17/21 10/15/21  Manuella Ghazi, Pratik D, DO  sucralfate (CARAFATE) 1 g tablet Take 1 tablet (1 g total) by mouth 4 (four) times daily -  with meals and at bedtime. 07/17/21 08/16/21  Manuella Ghazi, Pratik D, DO  traZODone (DESYREL) 50 MG tablet Take 1 tablet (50 mg total) by mouth at bedtime. 08/14/21   Orson Eva, MD  No current facility-administered medications for this encounter.   Current Outpatient Medications  Medication Sig Dispense Refill   acetaminophen (TYLENOL) 500 MG tablet Take 1,000 mg by mouth every 6 (six) hours as needed.     dicyclomine (BENTYL) 10 MG capsule Take by mouth.     famotidine (PEPCID) 20 MG tablet Take 20 mg by mouth 2 (two) times daily.      Lactulose 20 GM/30ML SOLN Take 30 mLs (20 g total) by mouth 2 (two) times a day. 450 mL 1   methadone (DOLOPHINE) 10 MG tablet Take by mouth See admin instructions. Take 2.5 tablets by mouth 3 times a day     metoCLOPramide (REGLAN) 5 MG/5ML solution Take 10 mg by mouth 3 (three) times daily.     NARCAN 4 MG/0.1ML LIQD nasal spray kit SMARTSIG:1 Spray(s) Both Nares Once PRN     ondansetron (ZOFRAN) 8 MG tablet Take 8 mg by mouth 3 (three) times daily.     oxycodone (ROXICODONE) 30 MG immediate release tablet Take 30 mg by mouth every 4 (four) hours as needed for pain.     pantoprazole (PROTONIX) 40 MG tablet Take 1 tablet (40 mg total) by mouth 2 (two) times daily. 180 tablet 2   sucralfate (CARAFATE) 1 g tablet Take 1 tablet (1 g total) by mouth 4 (four) times daily -  with meals and at bedtime. 120 tablet 0   traZODone (DESYREL) 50 MG tablet Take 1 tablet (50 mg total) by mouth at bedtime.      Allergies as of 02/10/2022 - Review Complete 02/10/2022  Allergen Reaction Noted   Ketamine and related Other (See Comments) 05/10/2019    Family History  Problem Relation Age of Onset   Prostate cancer Father    Colon cancer Neg Hx    Colon polyps Neg Hx     Social History   Socioeconomic History   Marital status: Married    Spouse name: Not on file   Number of children: Not on file   Years of education: Not on file   Highest education level: Not on file  Occupational History   Not on file  Tobacco Use   Smoking status: Former    Packs/day: 0.25    Types: Cigarettes   Smokeless tobacco: Never  Vaping Use   Vaping Use: Never used  Substance and Sexual Activity   Alcohol use: No    Comment: hx heavy alcohol, hasn't drank in 28 yrs   Drug use: Yes    Frequency: 2.0 times per week    Types: Marijuana   Sexual activity: Yes  Other Topics Concern   Not on file  Social History Narrative   WORKING [employer].   Social Determinants of Health   Financial  Resource Strain: Not on file  Food Insecurity: Not on file  Transportation Needs: Not on file  Physical Activity: Not on file  Stress: Not on file  Social Connections: Not on file  Intimate Partner Violence: Not on file    Review of Systems: General: Negative for anorexia, weight loss, fever, chills, fatigue, weakness. Eyes: Negative for vision changes.  ENT: Negative for hoarseness, difficulty swallowing , nasal congestion. CV: Negative for chest pain, angina, palpitations, dyspnea on exertion, peripheral edema.  Respiratory: Negative for dyspnea at rest, dyspnea on exertion, cough, sputum, wheezing.  GI: See history of present illness. GU:  Negative for dysuria, hematuria, urinary incontinence, urinary frequency, nocturnal urination.  MS: Negative for joint pain, low  back pain.  Derm: Negative for rash or itching.  Neuro: Negative for weakness, abnormal sensation, seizure, frequent headaches, memory loss, confusion.  Psych: Negative for anxiety, depression Endo: Negative for unusual weight change.  Heme: Negative for bruising or bleeding. Allergy: Negative for rash or hives.  Physical Exam: Vital signs in last 24 hours: Temp:  [98.4 F (36.9 C)] 98.4 F (36.9 C) (06/30 1752) Pulse Rate:  [86-93] 86 (06/30 1900) Resp:  [16] 16 (06/30 1900) BP: (121-122)/(89-94) 121/89 (06/30 1900) SpO2:  [100 %] 100 % (06/30 1900) Weight:  [78.5 kg] 78.5 kg (06/30 1752)   General:   Alert,  Well-developed, well-nourished, pleasant and cooperative in NAD Head:  Normocephalic and atraumatic. Eyes:  Sclera clear, no icterus.   Conjunctiva pink. Ears:  Normal auditory acuity. Nose:  No deformity, discharge,  or lesions. Mouth:  No deformity or lesions, dentition normal. Neck:  Supple; no masses or thyromegaly. Lungs:  Clear throughout to auscultation.   No wheezes, crackles, or rhonchi. No acute distress. Heart:  Regular rate and rhythm; no murmurs, clicks, rubs,  or gallops. Abdomen:  Soft,  nontender and nondistended. No masses, hepatosplenomegaly or hernias noted. Normal bowel sounds, without guarding, and without rebound.   Msk:  Symmetrical without gross deformities. Normal posture. Pulses:  Normal pulses noted. Extremities:  Without clubbing or edema. Neurologic:  Alert and  oriented x4;  grossly normal neurologically. Skin:  Intact without significant lesions or rashes. Cervical Nodes:  No significant cervical adenopathy. Psych:  Alert and cooperative. Normal mood and affect.  Intake/Output from previous day: No intake/output data recorded. Intake/Output this shift: No intake/output data recorded.  Lab Results: No results for input(s): "WBC", "HGB", "HCT", "PLT" in the last 72 hours. BMET Recent Labs    02/10/22 1848  NA 138  K 3.5  CL 108  CO2 26  GLUCOSE 109*  BUN 8  CREATININE 0.92  CALCIUM 8.5*   LFT No results for input(s): "PROT", "ALBUMIN", "AST", "ALT", "ALKPHOS", "BILITOT", "BILIDIR", "IBILI" in the last 72 hours. PT/INR No results for input(s): "LABPROT", "INR" in the last 72 hours. Hepatitis Panel No results for input(s): "HEPBSAG", "HCVAB", "HEPAIGM", "HEPBIGM" in the last 72 hours. C-Diff No results for input(s): "CDIFFTOX" in the last 72 hours.  Studies/Results: No results found.  Impression: *Esophageal food impaction *Stage IV esophageal cancer  Plan: Will proceed with urgent EGD to remove food impaction.  The risks including infection, bleed, or perforation as well as benefits, limitations, alternatives and imponderables have been reviewed with the patient. Potential for esophageal dilation, biopsy, etc. have also been reviewed.  Questions have been answered. All parties agreeable.  Keep patient NPO.   Elon Alas. Abbey Chatters, D.O. Gastroenterology and Hepatology [facility]    LOS: 0 days     02/10/2022, 7:57 PM

## 2022-02-10 NOTE — Op Note (Signed)
[facility] Patient Name: Maxwell Henderson Procedure Date: 02/10/2022 7:30 PM MRN: [id number] Date of Birth: [birth year] Attending MD: Elon Alas. Edgar Frisk CSN: [id number] Age: 56 Admit Type: Outpatient Procedure:                Upper GI endoscopy Indications:              Foreign body in the esophagus Providers:                Elon Alas. Abbey Chatters, DO, Lurline Del, RN, Wynonia Musty Tech, Technician Referring MD:              Medicines:                See the Anesthesia note for documentation of the                            administered medications Complications:            No immediate complications. Estimated Blood Loss:     Estimated blood loss was minimal. Procedure:                Pre-Anesthesia Assessment:                           - The anesthesia plan was to use monitored                            anesthesia care (MAC).                           After obtaining informed consent, the endoscope was                            passed under direct vision. Throughout the                            procedure, the patient's blood pressure, pulse, and                            oxygen saturations were monitored continuously. The                            [id number]-H190 ([id number]) scope was introduced through the                            mouth, and advanced to the second part of duodenum.                            The upper GI endoscopy was accomplished without                            difficulty. The patient tolerated the procedure                            well. Scope  In: 8:07:53 PM Scope Out: 8:13:33 PM Total Procedure Duration: 0 hours 5 minutes 40 seconds  Findings:      A submucosal mass with no bleeding and no stigmata of recent bleeding       was found at the gastroesophageal junction. The mass was partially       obstructing and circumferential with associated tight stricture.       Dilation of stricture with mucosal disruption was performed  just by       advancing endoscope through stricture. Food debris proximal to stricture       was removed with roth net.      The entire examined stomach was normal.      The duodenal bulb, first portion of the duodenum and second portion of       the duodenum were normal.      Food was found in the middle third of the esophagus. Removal of food was       accomplished with roth net Impression:               - Partially obstructing, malignant esophageal tumor                            was found at the gastroesophageal junction.                           - Normal stomach.                           - Normal duodenal bulb, first portion of the                            duodenum and second portion of the duodenum.                           - Food in the middle third of the esophagus.                            Removal was successful. Moderate Sedation:      Per Anesthesia Care Recommendation:           - Patient has a contact number available for                            emergencies. The signs and symptoms of potential                            delayed complications were discussed with the                            patient. Return to normal activities tomorrow.                            Written discharge instructions were provided to the                            patient.                           -  Mechanical soft diet.                           - Use Protonix (pantoprazole) 40 mg PO BID.                           - Follow up with primary GI at [facility]. May                            benefit from further dilations. Procedure Code(s):        --- Professional ---                           [phone], Esophagogastroduodenoscopy, flexible,                            transoral; with removal of foreign body(s) Diagnosis Code(s):        --- Professional ---                           C16.0, Malignant neoplasm of cardia                           T18.128A, Food in esophagus causing other  injury,                            initial encounter                           T18.108A, Unspecified foreign body in esophagus                            causing other injury, initial encounter CPT copyright 2019 American Medical Association. All rights reserved. The codes documented in this report are preliminary and upon coder review may  be revised to meet current compliance requirements. Elon Alas. Abbey Chatters, DO [place] Abbey Chatters, DO 02/10/2022 8:24:08 PM This report has been signed electronically. Number of Addenda: 0

## 2022-02-10 NOTE — ED Provider Notes (Signed)
[facility] EMERGENCY DEPARTMENT Provider Note   CSN: [id number] Arrival date & time: 02/10/22  1735     History  Chief Complaint  Patient presents with   Food Impaction    Maxwell Henderson is a 56 y.o. male.  He has a history of esophageal cancer on chemotherapy and mostly follows with [name].  He has had troubles with food impaction in the past.  He is complaining of food impaction after eating eggs and bacon today around 12:00.  He is unable to swallow any liquids.  He denies any other complaints.  The history is provided by the patient and the spouse.  Swallowed Foreign Body This is a recurrent problem. The current episode started 6 to 12 hours ago. The problem has not changed since onset.Associated symptoms include chest pain. Pertinent negatives include no abdominal pain, no headaches and no shortness of breath. The symptoms are aggravated by swallowing. Nothing relieves the symptoms. He has tried rest and water for the symptoms. The treatment provided no relief.       Home Medications Prior to Admission medications   Medication Sig Start Date End Date Taking? Authorizing Provider  acetaminophen (TYLENOL) 500 MG tablet Take 1,000 mg by mouth every 6 (six) hours as needed.    [provider]  dicyclomine (BENTYL) 10 MG capsule Take by mouth. 08/23/21   [provider]  famotidine (PEPCID) 20 MG tablet Take 20 mg by mouth 2 (two) times daily. 05/22/19   [provider]  Lactulose 20 GM/30ML SOLN Take 30 mLs (20 g total) by mouth 2 (two) times a day. 02/17/19   Doreatha Massed, MD  methadone (DOLOPHINE) 10 MG tablet Take by mouth See admin instructions. Take 2.5 tablets by mouth 3 times a day 07/15/21   [provider]  metoCLOPramide (REGLAN) 5 MG/5ML solution Take 10 mg by mouth 3 (three) times daily. 10/24/21   [provider]  NARCAN 4 MG/0.1ML LIQD nasal spray kit SMARTSIG:1 Spray(s) Both Nares Once PRN 09/20/21   [provider]  ondansetron (ZOFRAN) 8 MG tablet Take 8 mg by mouth 3 (three) times daily. 09/07/21   [provider]  oxycodone (ROXICODONE) 30 MG immediate release tablet Take 30 mg by mouth every 4 (four) hours as needed for pain. 07/15/21   [provider]  pantoprazole (PROTONIX) 40 MG tablet Take 1 tablet (40 mg total) by mouth 2 (two) times daily. 07/17/21 11/19/21  Sherryll Burger, Pratik D, DO  sucralfate (CARAFATE) 1 g tablet Take 1 tablet (1 g total) by mouth 4 (four) times daily -  with meals and at bedtime. 07/17/21 11/19/21  Sherryll Burger, Pratik D, DO  traZODone (DESYREL) 50 MG tablet Take 1 tablet (50 mg total) by mouth at bedtime. 08/14/21   Catarina Hartshorn, MD      Allergies    Ketamine and related    Review of Systems   Review of Systems  Constitutional:  Negative for fever.  HENT:  Positive for trouble swallowing. Negative for sore throat.   Eyes:  Negative for visual disturbance.  Respiratory:  Negative for shortness of breath.   Cardiovascular:  Positive for chest pain.  Gastrointestinal:  Negative for abdominal pain.  Musculoskeletal:  Negative for neck pain.  Neurological:  Negative for headaches.    Physical Exam Updated Vital Signs BP (!) 122/94   Pulse 93   Temp 98.4 F (36.9 C) (Oral)   Resp 16   Ht 6\' 1"  (1.854 m)   Wt 78.5 kg  SpO2 100%   BMI 22.82 kg/m  Physical Exam Vitals and nursing note reviewed.  Constitutional:      General: He is not in acute distress.    Appearance: Normal appearance. He is well-developed.  HENT:     Head: Normocephalic and atraumatic.  Eyes:     Conjunctiva/sclera: Conjunctivae normal.  Neck:     Comments: No stridor Cardiovascular:     Rate and Rhythm: Normal rate and regular rhythm.     Heart sounds: No murmur heard. Pulmonary:     Effort: Pulmonary effort is normal. No respiratory distress.     Breath sounds: Normal breath sounds.  Abdominal:     Palpations: Abdomen is soft.     Tenderness: There is no abdominal tenderness. There  is no guarding or rebound.  Musculoskeletal:     Cervical back: Neck supple.  Skin:    General: Skin is warm and dry.     Capillary Refill: Capillary refill takes less than 2 seconds.  Neurological:     General: No focal deficit present.     Mental Status: He is alert.     ED Results / Procedures / Treatments   Labs (all labs ordered are listed, but only abnormal results are displayed) Labs Reviewed  BASIC METABOLIC PANEL - Abnormal; Notable for the following components:      Result Value   Glucose, Bld 109 (*)    Calcium 8.5 (*)    Anion gap 4 (*)    All other components within normal limits    EKG None  Radiology No results found.  Procedures Procedures    Medications Ordered in ED Medications  lactated ringers infusion (has no administration in time range)  sodium chloride 0.9 % bolus 500 mL (500 mLs Intravenous New Bag/Given 02/10/22 1853)  glucagon (human recombinant) (GLUCAGEN) injection 1 mg (1 mg Intravenous Given 02/10/22 1854)    ED Course/ Medical Decision Making/ A&P Clinical Course as of 02/11/22 1030  Fri Feb 10, 2022  1830 Discussed with Dr. Marletta Lor GI.  He asked if we can get an IV in the patient and try some glucagon.  He will call anesthesia and be here in a little bit.  I updated patient on plan. [MB]  1956 Patient on the way to endoscopy. [MB]    Clinical Course User Index [MB] Terrilee Files, MD                           Medical Decision Making Amount and/or Complexity of Data Reviewed Labs: ordered.  Risk Prescription drug management.   This patient complains of impaction esophagus; this involves an extensive number of treatment Options and is a complaint that carries with it a high risk of complications and morbidity. The differential includes food impaction, esophageal cancer, obstruction  I ordered, reviewed and interpreted labs, which included Chem-7 with mildly elevated glucose I ordered medication glucagon without improvement  and reviewed PMP when indicated. Additional history obtained from patient's wife Previous records obtained and reviewed in epic, follows with [facility] oncology and GI although has been scoped here before I consulted Dr. Marletta Lor GI and discussed lab and imaging findings and discussed disposition.   Social determinants considered, no significant barriers Critical Interventions: None  After the interventions stated above, I reevaluated the patient and found patient resting comfortably Admission and further testing considered, patient awaiting GI team for endoscopy and likely discharge from the Endo suite  Final Clinical Impression(s) / ED Diagnoses Final diagnoses:  Esophageal obstruction due to food impaction  Malignant neoplasm of esophagus, unspecified location [facility])    Rx / DC Orders ED Discharge Orders     None         Terrilee Files, MD 02/11/22 1032

## 2022-02-10 NOTE — Transfer of Care (Signed)
Immediate Anesthesia Transfer of Care Note  Patient: Maxwell Henderson  Procedure(s) Performed: ESOPHAGOGASTRODUODENOSCOPY (EGD) WITH PROPOFOL  Patient Location: PACU  Anesthesia Type:General  Level of Consciousness: awake, alert  and oriented  Airway & Oxygen Therapy: Patient Spontanous Breathing  Post-op Assessment: Report given to RN and Post -op Vital signs reviewed and stable  Post vital signs: Reviewed and stable  Last Vitals:  Vitals Value Taken Time  BP    Temp    Pulse    Resp    SpO2      Last Pain:  Vitals:   02/10/22 1752  TempSrc: Oral  PainSc:          Complications: No notable events documented.

## 2022-02-10 NOTE — Interval H&P Note (Signed)
History and Physical Interval Note:  02/10/2022 7:58 PM  Maxwell Henderson  has presented today for surgery, with the diagnosis of Food Impaction.  The various methods of treatment have been discussed with the patient and family. After consideration of risks, benefits and other options for treatment, the patient has consented to  Procedure(s): ESOPHAGOGASTRODUODENOSCOPY (EGD) WITH PROPOFOL (N/A) as a surgical intervention.  The patient's history has been reviewed, patient examined, no change in status, stable for surgery.  I have reviewed the patient's chart and labs.  Questions were answered to the patient's satisfaction.     Eloise Harman

## 2022-02-10 NOTE — Consult Note (Signed)
Consulting  Provider: Dr. Melina Copa Primary Care Physician:  Patient, No Pcp Per Primary Gastroenterologist:  Dr. Truddie Crumble [facility])  Reason for Consultation: Food impaction  HPI:  Maxwell Henderson is a 56 y.o. male with a past medical history of stage IV metastatic esophageal adenocarcinoma diagnosed in 2018, currently receiving palliative chemotherapy and radiation at [facility].  History of food impactions in the past.  States he was eating a bacon and eggs this morning which got stuck.  Has tried to resolve obstruction with liquids and has been unsuccessful.  Not tolerating any liquids including water at present without vomiting back up.  Tolerating secretions appears.  Given glucagon without improvement.  Does note some epigastric discomfort.  No shortness of breath.  Past Medical History:  Diagnosis Date   Diabetes mellitus without complication ([place])    Hypertension    Iron deficiency anemia due to chronic blood loss 02/28/2017   Primary esophageal adenocarcinoma ([place]) 01/29/2017    Past Surgical History:  Procedure Laterality Date   ESOPHAGOGASTRODUODENOSCOPY  07/29/2021   focal stenosis at the GE junction s/p dilation. Malignant-appearing mass in proximal stomach just below GE junction.   ESOPHAGOGASTRODUODENOSCOPY (EGD) WITH PROPOFOL N/A 01/16/2017   Procedure: ESOPHAGOGASTRODUODENOSCOPY (EGD) WITH PROPOFOL;  Surgeon: [name], MD;  Location: [facility] ENDO SUITE;  Service: Endoscopy;  Laterality: N/A;  730    ESOPHAGOGASTRODUODENOSCOPY (EGD) WITH PROPOFOL N/A 11/01/2020   Procedure: ESOPHAGOGASTRODUODENOSCOPY (EGD) WITH PROPOFOL;  Surgeon: Daneil Dolin, MD;  Location: [facility] ENDO SUITE;  Service: Endoscopy;  Laterality: N/A;   ESOPHAGOGASTRODUODENOSCOPY (EGD) WITH PROPOFOL N/A 07/17/2021   malignant esophagea tumor at GE junction, oozing gastric ulcer felt to be malignant.   ESOPHAGOGASTRODUODENOSCOPY (EGD) WITH PROPOFOL N/A 11/19/2021   Procedure: ESOPHAGOGASTRODUODENOSCOPY (EGD)  WITH PROPOFOL;  Surgeon: Eloise Harman, DO;  Location: [facility] ENDO SUITE;  Service: Endoscopy;  Laterality: N/A;   IR FLUORO GUIDE PORT INSERTION RIGHT  02/13/2017   IR US GUIDE VASC ACCESS RIGHT  02/13/2017   lipoma removal     PORTA CATH INSERTION Right 02/13/2017   SAVORY DILATION N/A 01/16/2017   Procedure: SAVORY DILATION;  Surgeon: [name], MD;  Location: [facility] ENDO SUITE;  Service: Endoscopy;  Laterality: N/A;    Prior to Admission medications   Medication Sig Start Date End Date Taking? Authorizing Provider  famotidine (PEPCID) 20 MG tablet Take 20 mg by mouth 2 (two) times daily. 05/22/19   [provider]  gabapentin (NEURONTIN) 300 MG capsule Take 300 mg by mouth 3 (three) times daily. 06/25/21   [provider]  Lactulose 20 GM/30ML SOLN Take 30 mLs (20 g total) by mouth 2 (two) times a day. 02/17/19   Derek Jack, MD  methadone (DOLOPHINE) 10 MG tablet Take by mouth See admin instructions. Take 2.5 tablets by mouth 3 times a day 07/15/21   [provider]  oxycodone (ROXICODONE) 30 MG immediate release tablet Take 30 mg by mouth every 4 (four) hours as needed for pain. 07/15/21   [provider]  pantoprazole (PROTONIX) 40 MG tablet Take 1 tablet (40 mg total) by mouth 2 (two) times daily. 07/17/21 10/15/21  Manuella Ghazi, Pratik D, DO  sucralfate (CARAFATE) 1 g tablet Take 1 tablet (1 g total) by mouth 4 (four) times daily -  with meals and at bedtime. 07/17/21 08/16/21  Manuella Ghazi, Pratik D, DO  traZODone (DESYREL) 50 MG tablet Take 1 tablet (50 mg total) by mouth at bedtime. 08/14/21   Orson Eva, MD  No current facility-administered medications for this encounter.   Current Outpatient Medications  Medication Sig Dispense Refill   acetaminophen (TYLENOL) 500 MG tablet Take 1,000 mg by mouth every 6 (six) hours as needed.     dicyclomine (BENTYL) 10 MG capsule Take by mouth.     famotidine (PEPCID) 20 MG tablet Take 20 mg by mouth 2 (two) times daily.      Lactulose 20 GM/30ML SOLN Take 30 mLs (20 g total) by mouth 2 (two) times a day. 450 mL 1   methadone (DOLOPHINE) 10 MG tablet Take by mouth See admin instructions. Take 2.5 tablets by mouth 3 times a day     metoCLOPramide (REGLAN) 5 MG/5ML solution Take 10 mg by mouth 3 (three) times daily.     NARCAN 4 MG/0.1ML LIQD nasal spray kit SMARTSIG:1 Spray(s) Both Nares Once PRN     ondansetron (ZOFRAN) 8 MG tablet Take 8 mg by mouth 3 (three) times daily.     oxycodone (ROXICODONE) 30 MG immediate release tablet Take 30 mg by mouth every 4 (four) hours as needed for pain.     pantoprazole (PROTONIX) 40 MG tablet Take 1 tablet (40 mg total) by mouth 2 (two) times daily. 180 tablet 2   sucralfate (CARAFATE) 1 g tablet Take 1 tablet (1 g total) by mouth 4 (four) times daily -  with meals and at bedtime. 120 tablet 0   traZODone (DESYREL) 50 MG tablet Take 1 tablet (50 mg total) by mouth at bedtime.      Allergies as of 02/10/2022 - Review Complete 02/10/2022  Allergen Reaction Noted   Ketamine and related Other (See Comments) 05/10/2019    Family History  Problem Relation Age of Onset   Prostate cancer Father    Colon cancer Neg Hx    Colon polyps Neg Hx     Social History   Socioeconomic History   Marital status: Married    Spouse name: Not on file   Number of children: Not on file   Years of education: Not on file   Highest education level: Not on file  Occupational History   Not on file  Tobacco Use   Smoking status: Former    Packs/day: 0.25    Types: Cigarettes   Smokeless tobacco: Never  Vaping Use   Vaping Use: Never used  Substance and Sexual Activity   Alcohol use: No    Comment: hx heavy alcohol, hasn't drank in 28 yrs   Drug use: Yes    Frequency: 2.0 times per week    Types: Marijuana   Sexual activity: Yes  Other Topics Concern   Not on file  Social History Narrative   WORKING [employer] [place].   Social Determinants of Health   Financial  Resource Strain: Not on file  Food Insecurity: Not on file  Transportation Needs: Not on file  Physical Activity: Not on file  Stress: Not on file  Social Connections: Not on file  Intimate Partner Violence: Not on file    Review of Systems: General: Negative for anorexia, weight loss, fever, chills, fatigue, weakness. Eyes: Negative for vision changes.  ENT: Negative for hoarseness, difficulty swallowing , nasal congestion. CV: Negative for chest pain, angina, palpitations, dyspnea on exertion, peripheral edema.  Respiratory: Negative for dyspnea at rest, dyspnea on exertion, cough, sputum, wheezing.  GI: See history of present illness. GU:  Negative for dysuria, hematuria, urinary incontinence, urinary frequency, nocturnal urination.  MS: Negative for joint pain, low  back pain.  Derm: Negative for rash or itching.  Neuro: Negative for weakness, abnormal sensation, seizure, frequent headaches, memory loss, confusion.  Psych: Negative for anxiety, depression Endo: Negative for unusual weight change.  Heme: Negative for bruising or bleeding. Allergy: Negative for rash or hives.  Physical Exam: Vital signs in last 24 hours: Temp:  [98.4 F (36.9 C)] 98.4 F (36.9 C) (06/30 1752) Pulse Rate:  [86-93] 86 (06/30 1900) Resp:  [16] 16 (06/30 1900) BP: (121-122)/(89-94) 121/89 (06/30 1900) SpO2:  [100 %] 100 % (06/30 1900) Weight:  [78.5 kg] 78.5 kg (06/30 1752)   General:   Alert,  Well-developed, well-nourished, pleasant and cooperative in NAD Head:  Normocephalic and atraumatic. Eyes:  Sclera clear, no icterus.   Conjunctiva pink. Ears:  Normal auditory acuity. Nose:  No deformity, discharge,  or lesions. Mouth:  No deformity or lesions, dentition normal. Neck:  Supple; no masses or thyromegaly. Lungs:  Clear throughout to auscultation.   No wheezes, crackles, or rhonchi. No acute distress. Heart:  Regular rate and rhythm; no murmurs, clicks, rubs,  or gallops. Abdomen:  Soft,  nontender and nondistended. No masses, hepatosplenomegaly or hernias noted. Normal bowel sounds, without guarding, and without rebound.   Msk:  Symmetrical without gross deformities. Normal posture. Pulses:  Normal pulses noted. Extremities:  Without clubbing or edema. Neurologic:  Alert and  oriented x4;  grossly normal neurologically. Skin:  Intact without significant lesions or rashes. Cervical Nodes:  No significant cervical adenopathy. Psych:  Alert and cooperative. Normal mood and affect.  Intake/Output from previous day: No intake/output data recorded. Intake/Output this shift: No intake/output data recorded.  Lab Results: No results for input(s): "WBC", "HGB", "HCT", "PLT" in the last 72 hours. BMET Recent Labs    02/10/22 1848  NA 138  K 3.5  CL 108  CO2 26  GLUCOSE 109*  BUN 8  CREATININE 0.92  CALCIUM 8.5*   LFT No results for input(s): "PROT", "ALBUMIN", "AST", "ALT", "ALKPHOS", "BILITOT", "BILIDIR", "IBILI" in the last 72 hours. PT/INR No results for input(s): "LABPROT", "INR" in the last 72 hours. Hepatitis Panel No results for input(s): "HEPBSAG", "HCVAB", "HEPAIGM", "HEPBIGM" in the last 72 hours. C-Diff No results for input(s): "CDIFFTOX" in the last 72 hours.  Studies/Results: No results found.  Impression: *Esophageal food impaction *Stage IV esophageal cancer  Plan: Will proceed with urgent EGD to remove food impaction.  The risks including infection, bleed, or perforation as well as benefits, limitations, alternatives and imponderables have been reviewed with the patient. Potential for esophageal dilation, biopsy, etc. have also been reviewed.  Questions have been answered. All parties agreeable.  Keep patient NPO.   Elon Alas. Abbey Chatters, D.O. Gastroenterology and Hepatology [facility]    LOS: 0 days     02/10/2022, 7:57 PM

## 2022-02-10 NOTE — Anesthesia Postprocedure Evaluation (Signed)
Anesthesia Post Note  Patient: Maxwell Henderson  Procedure(s) Performed: ESOPHAGOGASTRODUODENOSCOPY (EGD) WITH PROPOFOL  Patient location during evaluation: PACU Anesthesia Type: General Level of consciousness: awake and alert Pain management: pain level controlled Vital Signs Assessment: post-procedure vital signs reviewed and stable Respiratory status: spontaneous breathing, nonlabored ventilation, respiratory function stable and patient connected to nasal cannula oxygen Cardiovascular status: blood pressure returned to baseline and stable Postop Assessment: no apparent nausea or vomiting Anesthetic complications: no   No notable events documented.   Last Vitals:  Vitals:   02/10/22 1752 02/10/22 1900  BP: (!) 122/94 121/89  Pulse: 93 86  Resp: 16 16  Temp: 36.9 C   SpO2: 100% 100%    Last Pain:  Vitals:   02/10/22 1752  TempSrc: Oral  PainSc:                  Louann Sjogren

## 2022-02-16 ENCOUNTER — Encounter (HOSPITAL_COMMUNITY): Payer: Self-pay | Admitting: Internal Medicine

## 2022-03-06 ENCOUNTER — Other Ambulatory Visit: Payer: Self-pay

## 2022-03-06 ENCOUNTER — Encounter (HOSPITAL_COMMUNITY): Payer: Self-pay | Admitting: *Deleted

## 2022-03-06 ENCOUNTER — Emergency Department (HOSPITAL_COMMUNITY)
Admission: EM | Admit: 2022-03-06 | Discharge: 2022-03-06 | Disposition: A | Discharge status: Home / Self Care | Payer: Medicaid Other | Attending: Emergency Medicine | Admitting: Emergency Medicine

## 2022-03-06 DIAGNOSIS — R770 Abnormality of albumin: Secondary | ICD-10-CM | POA: Insufficient documentation

## 2022-03-06 DIAGNOSIS — T451X5A Adverse effect of antineoplastic and immunosuppressive drugs, initial encounter: Secondary | ICD-10-CM

## 2022-03-06 DIAGNOSIS — I10 Essential (primary) hypertension: Secondary | ICD-10-CM | POA: Diagnosis not present

## 2022-03-06 DIAGNOSIS — R5383 Other fatigue: Secondary | ICD-10-CM | POA: Diagnosis present

## 2022-03-06 DIAGNOSIS — D702 Other drug-induced agranulocytosis: Secondary | ICD-10-CM | POA: Insufficient documentation

## 2022-03-06 DIAGNOSIS — Z8501 Personal history of malignant neoplasm of esophagus: Secondary | ICD-10-CM | POA: Insufficient documentation

## 2022-03-06 DIAGNOSIS — E119 Type 2 diabetes mellitus without complications: Secondary | ICD-10-CM | POA: Insufficient documentation

## 2022-03-06 LAB — DIFFERENTIAL
Abs Immature Granulocytes: 0.01 10*3/uL (ref 0.00–0.07)
Basophils Absolute: 0 10*3/uL (ref 0.0–0.1)
Basophils Relative: 0 %
Eosinophils Absolute: 0.1 10*3/uL (ref 0.0–0.5)
Eosinophils Relative: 2 %
Lymphocytes Relative: 24 %
Lymphs Abs: 0.3 10*3/uL — ABNORMAL LOW (ref 0.7–4.0)
Monocytes Absolute: 0.1 10*3/uL (ref 0.1–1.0)
Monocytes Relative: 12 %
Neutro Abs: 0.5 10*3/uL — ABNORMAL LOW (ref 1.7–7.7)
Neutrophils Relative %: 62 %

## 2022-03-06 LAB — COMPREHENSIVE METABOLIC PANEL
ALT: 18 U/L (ref 0–44)
AST: 19 U/L (ref 15–41)
Albumin: 3.1 g/dL — ABNORMAL LOW (ref 3.5–5.0)
Alkaline Phosphatase: 68 U/L (ref 38–126)
Anion gap: 5 (ref 5–15)
BUN: 12 mg/dL (ref 6–20)
CO2: 28 mmol/L (ref 22–32)
Calcium: 8.2 mg/dL — ABNORMAL LOW (ref 8.9–10.3)
Chloride: 104 mmol/L (ref 98–111)
Creatinine, Ser: 0.82 mg/dL (ref 0.61–1.24)
GFR, Estimated: >60 mL/min (ref >60)
Glucose, Bld: 122 mg/dL — ABNORMAL HIGH (ref 70–99)
Potassium: 3.8 mmol/L (ref 3.5–5.1)
Sodium: 137 mmol/L (ref 135–145)
Total Bilirubin: 0.8 mg/dL (ref 0.3–1.2)
Total Protein: 6.7 g/dL (ref 6.5–8.1)

## 2022-03-06 LAB — CBC
HCT: 30.5 % — ABNORMAL LOW (ref 39.0–52.0)
Hemoglobin: 9.9 g/dL — ABNORMAL LOW (ref 13.0–17.0)
MCH: 24.1 pg — ABNORMAL LOW (ref 26.0–34.0)
MCHC: 32.5 g/dL (ref 30.0–36.0)
MCV: 74.2 fL — ABNORMAL LOW (ref 80.0–100.0)
Platelets: 137 10*3/uL — ABNORMAL LOW (ref 150–400)
RBC: 4.11 MIL/uL — ABNORMAL LOW (ref 4.22–5.81)
RDW: 19.8 % — ABNORMAL HIGH (ref 11.5–15.5)
WBC: 0.9 10*3/uL — CL (ref 4.0–10.5)
nRBC: 0 % (ref 0.0–0.2)

## 2022-03-06 MED ORDER — SODIUM CHLORIDE 0.9 % IV BOLUS
1000.0000 mL | Freq: Once | INTRAVENOUS | Status: AC
  Administered 2022-03-06: 1000 mL via INTRAVENOUS

## 2022-03-06 NOTE — ED Notes (Signed)
Patient showed this nurse prior to triage a piece of meat in the trash can in the waiting room lobby. Patient stated "I think I got it out."

## 2022-03-06 NOTE — ED Notes (Signed)
PA at bedside providing update to the pt

## 2022-03-06 NOTE — Discharge Instructions (Signed)
You were seen in the emergency department for fatigue and choking on food.  I'm glad you were able to cough up the food prior to being roomed in the ER. As we discussed, on your lab work your white blood cell count was very low. This could be in the setting of having just had chemotherapy, or due to something like an infection. We discussed the symptoms of such infections, and you did not have any of these.  We have given you some IV fluids and I want you to continue to hydrate well at home. We were unable to get ahold of anyone from your oncology team, so please give them a call first thing in the morning to schedule a follow up.  Continue to monitor how you're doing and return to the ER for new or worsening symptoms.

## 2022-03-06 NOTE — ED Notes (Signed)
PA & MD notified re: WBC count

## 2022-03-06 NOTE — ED Provider Notes (Signed)
[facility] EMERGENCY DEPARTMENT Provider Note   CSN: [id number] Arrival date & time: 03/06/22  0962     History  Chief Complaint  Patient presents with   Choking    Pt has throat cancer and states "a piece of meat or something is stuck in my throat."    Fatigue    Maxwell Henderson is a 56 y.o. male who presented to the emergency department complaining of foreign body sensation in the throat.  Patient with a history of stage IV esophageal adenocarcinoma, follows at [facility].  Has been seen in this ER numerous times for similar symptoms, requiring multiple endoscopies.  Patient states that while he was sitting in the waiting room he was able to cough/spit up the foreign body, no longer feels as though something is stuck in his throat.  He had chemo over the weekend, and has been feeling increasingly fatigued.  Not complaining of any worsening pain, just generalized malaise.  No vomiting or diarrhea, no blood in stool.  Is requesting IV fluids.  HPI     Home Medications Prior to Admission medications   Medication Sig Start Date End Date Taking? Authorizing Provider  acetaminophen (TYLENOL) 500 MG tablet Take 1,000 mg by mouth every 6 (six) hours as needed.   Yes [provider]  dicyclomine (BENTYL) 10 MG capsule Take 10 mg by mouth 3 (three) times daily before meals. 08/23/21  Yes [provider]  famotidine (PEPCID) 20 MG tablet Take 20 mg by mouth 2 (two) times daily. 05/22/19  Yes [provider]  Lactulose 20 GM/30ML SOLN Take 30 mLs (20 g total) by mouth 2 (two) times a day. 02/17/19  Yes Derek Jack, MD  methadone (DOLOPHINE) 10 MG tablet Take by mouth See admin instructions. Take 2.5 tablets by mouth 3 times a day 07/15/21  Yes [provider]  metoCLOPramide (REGLAN) 5 MG/5ML solution Take 10 mg by mouth 3 (three) times daily. 10/24/21  Yes [provider]  NARCAN 4 MG/0.1ML LIQD nasal spray kit SMARTSIG:1 Spray(s) Both  Nares Once PRN 09/20/21  Yes [provider]  ondansetron (ZOFRAN) 8 MG tablet Take 8 mg by mouth 3 (three) times daily. 09/07/21  Yes [provider]  oxycodone (ROXICODONE) 30 MG immediate release tablet Take 30 mg by mouth every 4 (four) hours as needed for pain. 07/15/21  Yes [provider]  pantoprazole (PROTONIX) 40 MG tablet Take 1 tablet (40 mg total) by mouth 2 (two) times daily. 07/17/21 03/06/22 Yes Shah, Pratik D, DO  pregabalin (LYRICA) 50 MG capsule Take 50 mg by mouth 3 (three) times daily. 02/10/22  Yes [provider]  traZODone (DESYREL) 50 MG tablet Take 1 tablet (50 mg total) by mouth at bedtime. 08/14/21  Yes Tat, Shanon Brow, MD  sucralfate (CARAFATE) 1 g tablet Take 1 tablet (1 g total) by mouth 4 (four) times daily -  with meals and at bedtime. 07/17/21 11/19/21  Manuella Ghazi, Pratik D, DO      Allergies    Ketamine and related    Review of Systems   Review of Systems  Constitutional:  Positive for fatigue. Negative for chills and fever.  HENT:         Initially had trouble swallowing, this is resolved upon my evaluation  Gastrointestinal:  Negative for abdominal pain, blood in stool, constipation, diarrhea, nausea and vomiting.  All other systems reviewed and are negative.   Physical Exam Updated Vital Signs BP 120/68   Pulse 65  Temp 98.5 F (36.9 C) (Oral)   Ht '6\' 1"'  (1.854 m)   SpO2 99%   BMI 22.82 kg/m  Physical Exam Vitals and nursing note reviewed.  Constitutional:      Appearance: Normal appearance.     Comments: Fatigued, but alert  HENT:     Head: Normocephalic and atraumatic.     Comments: Tolerating own secretions Eyes:     Conjunctiva/sclera: Conjunctivae normal.  Cardiovascular:     Rate and Rhythm: Normal rate and regular rhythm.  Pulmonary:     Effort: Pulmonary effort is normal. No respiratory distress.     Breath sounds: Normal breath sounds.  Abdominal:     General: There is no distension.     Palpations: Abdomen  is soft.     Tenderness: There is no abdominal tenderness.  Skin:    General: Skin is warm and dry.  Neurological:     General: No focal deficit present.     Mental Status: He is alert.     ED Results / Procedures / Treatments   Labs (all labs ordered are listed, but only abnormal results are displayed) Labs Reviewed  CBC - Abnormal; Notable for the following components:      Result Value   WBC 0.9 (*)    RBC 4.11 (*)    Hemoglobin 9.9 (*)    HCT 30.5 (*)    MCV 74.2 (*)    MCH 24.1 (*)    RDW 19.8 (*)    Platelets 137 (*)    All other components within normal limits  COMPREHENSIVE METABOLIC PANEL - Abnormal; Notable for the following components:   Glucose, Bld 122 (*)    Calcium 8.2 (*)    Albumin 3.1 (*)    All other components within normal limits  DIFFERENTIAL - Abnormal; Notable for the following components:   Neutro Abs 0.5 (*)    Lymphs Abs 0.3 (*)    All other components within normal limits    EKG None  Radiology No results found.  Procedures Procedures    Medications Ordered in ED Medications  sodium chloride 0.9 % bolus 1,000 mL (0 mLs Intravenous Stopped 03/06/22 1423)    ED Course/ Medical Decision Making/ A&P                           Medical Decision Making Amount and/or Complexity of Data Reviewed Labs: ordered.  This patient is a 56 y.o. male  who presents to the ED for concern of foreign body sensation in throat, and generalized fatigue. Coughed/spit up foreign body in waiting room and is now tolerating liquids without foreign body sensation.   Differential diagnoses prior to evaluation: The emergent differential diagnosis includes, but is not limited to, adverse reaction to chemotherapy, electrolyte abnormality, anemia, sepsis, viral illness, dehydration.  This is not an exhaustive differential.   Past Medical History / Co-morbidities: Hypertension, diabetes, stage IV esophageal carcinoma  Additional history: Chart reviewed.  Pertinent results include: Patient most recently seen on 6/30 and 4/8 with similar symptoms, require endoscopy at both times.  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  Physical Exam: Physical exam performed. The pertinent findings include: Normal vital signs.  Abdomen soft, nontender. Tolerating liquids.   Lab Tests/Imaging studies: I personally interpreted labs/imaging and the pertinent results include: Leukopenia of 0.9, neutrophil count 0.5, lymphocyte count 0.3.  Hemoglobin of 9.9 compared  to 10.2 three months ago.  Kidney function normal, and electrolytes grossly within normal limits.  Albumin 3.1, otherwise liver function normal.   Medications: I ordered medication including IV fluids.  I have reviewed the patients home medicines and have made adjustments as needed.    Consultations obtained: I consulted [facility] hematology/oncology PA on-call for Dr. Emeterio Reeve, April Greissinger PA-C about patient's abnormal labs.  She was able to review them and believes they are related to his recent chemotherapy.  She recommended I have him call the office to make a follow-up appointment.   Disposition: After consideration of the diagnostic results and the patients response to treatment, I feel that emergency department workup does not suggest an emergent condition requiring admission or immediate intervention beyond what has been performed at this time. The plan is: Discharge to home and follow-up with oncology. The patient is safe for discharge and has been instructed to return immediately for worsening symptoms, change in symptoms or any other concerns.   Final Clinical Impression(s) / ED Diagnoses Final diagnoses:  Other fatigue  Chemotherapy induced neutropenia ([place])    Rx / DC Orders ED Discharge Orders     None      Portions of this report may have been transcribed using voice recognition software. Every  effort was made to ensure accuracy; however, inadvertent computerized transcription errors may be present.    Estill Cotta 03/06/22 1814    Daleen Bo, MD 03/07/22 0800

## 2022-06-05 ENCOUNTER — Emergency Department (HOSPITAL_COMMUNITY): Payer: Medicaid Other | Admitting: Anesthesiology

## 2022-06-05 ENCOUNTER — Encounter (HOSPITAL_COMMUNITY)
Admission: EM | Disposition: A | Discharge status: Home / Self Care | Payer: Self-pay | Source: Home / Self Care | Attending: Emergency Medicine

## 2022-06-05 ENCOUNTER — Ambulatory Visit (HOSPITAL_COMMUNITY)
Admission: EM | Admit: 2022-06-05 | Discharge: 2022-06-05 | Disposition: A | Discharge status: Home / Self Care | Payer: Medicaid Other | Attending: Emergency Medicine | Admitting: Emergency Medicine

## 2022-06-05 ENCOUNTER — Encounter (HOSPITAL_COMMUNITY): Payer: Self-pay | Admitting: *Deleted

## 2022-06-05 ENCOUNTER — Other Ambulatory Visit: Payer: Self-pay

## 2022-06-05 DIAGNOSIS — Z9221 Personal history of antineoplastic chemotherapy: Secondary | ICD-10-CM | POA: Insufficient documentation

## 2022-06-05 DIAGNOSIS — T18108A Unspecified foreign body in esophagus causing other injury, initial encounter: Secondary | ICD-10-CM | POA: Diagnosis not present

## 2022-06-05 DIAGNOSIS — T18128A Food in esophagus causing other injury, initial encounter: Secondary | ICD-10-CM

## 2022-06-05 DIAGNOSIS — Z8501 Personal history of malignant neoplasm of esophagus: Secondary | ICD-10-CM | POA: Insufficient documentation

## 2022-06-05 DIAGNOSIS — C160 Malignant neoplasm of cardia: Secondary | ICD-10-CM | POA: Diagnosis not present

## 2022-06-05 DIAGNOSIS — Z923 Personal history of irradiation: Secondary | ICD-10-CM | POA: Insufficient documentation

## 2022-06-05 DIAGNOSIS — Z79899 Other long term (current) drug therapy: Secondary | ICD-10-CM | POA: Insufficient documentation

## 2022-06-05 DIAGNOSIS — W44F3XA Food entering into or through a natural orifice, initial encounter: Secondary | ICD-10-CM

## 2022-06-05 DIAGNOSIS — I10 Essential (primary) hypertension: Secondary | ICD-10-CM | POA: Diagnosis not present

## 2022-06-05 DIAGNOSIS — Z87891 Personal history of nicotine dependence: Secondary | ICD-10-CM | POA: Insufficient documentation

## 2022-06-05 DIAGNOSIS — E119 Type 2 diabetes mellitus without complications: Secondary | ICD-10-CM | POA: Diagnosis not present

## 2022-06-05 DIAGNOSIS — K219 Gastro-esophageal reflux disease without esophagitis: Secondary | ICD-10-CM | POA: Diagnosis not present

## 2022-06-05 DIAGNOSIS — X58XXXA Exposure to other specified factors, initial encounter: Secondary | ICD-10-CM | POA: Insufficient documentation

## 2022-06-05 HISTORY — PX: ESOPHAGOGASTRODUODENOSCOPY (EGD) WITH PROPOFOL: SHX5813

## 2022-06-05 LAB — I-STAT CHEM 8, ED
BUN: 7 mg/dL (ref 6–20)
Calcium, Ion: 1.19 mmol/L (ref 1.15–1.40)
Chloride: 101 mmol/L (ref 98–111)
Creatinine, Ser: 0.7 mg/dL (ref 0.61–1.24)
Glucose, Bld: 98 mg/dL (ref 70–99)
HCT: 27 % — ABNORMAL LOW (ref 39.0–52.0)
Hemoglobin: 9.2 g/dL — ABNORMAL LOW (ref 13.0–17.0)
Potassium: 3.6 mmol/L (ref 3.5–5.1)
Sodium: 139 mmol/L (ref 135–145)
TCO2: 26 mmol/L (ref 22–32)

## 2022-06-05 SURGERY — ESOPHAGOGASTRODUODENOSCOPY (EGD) WITH PROPOFOL
Anesthesia: General

## 2022-06-05 MED ORDER — SODIUM CHLORIDE 0.9 % IV BOLUS
500.0000 mL | Freq: Once | INTRAVENOUS | Status: AC
  Administered 2022-06-05: 500 mL via INTRAVENOUS

## 2022-06-05 MED ORDER — LACTATED RINGERS IV SOLN: INTRAVENOUS | Status: DC | PRN

## 2022-06-05 MED ORDER — PROPOFOL 10 MG/ML IV BOLUS
INTRAVENOUS | Status: AC
  Filled 2022-06-05: qty 20

## 2022-06-05 MED ORDER — HYDROMORPHONE HCL 1 MG/ML IJ SOLN
0.5000 mg | Freq: Once | INTRAMUSCULAR | Status: AC
  Administered 2022-06-05: 0.5 mg via INTRAVENOUS
  Filled 2022-06-05: qty 0.5

## 2022-06-05 MED ORDER — PROPOFOL 10 MG/ML IV BOLUS
INTRAVENOUS | Status: DC | PRN
  Administered 2022-06-05: 200 mg via INTRAVENOUS

## 2022-06-05 MED ORDER — GLUCAGON HCL RDNA (DIAGNOSTIC) 1 MG IJ SOLR
1.0000 mg | Freq: Once | INTRAMUSCULAR | Status: AC
  Administered 2022-06-05: 1 mg via INTRAVENOUS
  Filled 2022-06-05: qty 1

## 2022-06-05 NOTE — Transfer of Care (Signed)
Immediate Anesthesia Transfer of Care Note  Patient: Maxwell Henderson  Procedure(s) Performed: ESOPHAGOGASTRODUODENOSCOPY (EGD) WITH PROPOFOL  Patient Location: PACU  Anesthesia Type:General  Level of Consciousness: sedated  Airway & Oxygen Therapy: Patient Spontanous Breathing  Post-op Assessment: Report given to RN and Post -op Vital signs reviewed and stable  Post vital signs: Reviewed and stable  Last Vitals:  Vitals Value Taken Time  BP 124/94   Temp 98.8   Pulse 81   Resp 16   SpO2 99     Last Pain:  Vitals:   06/05/22 2230  TempSrc: Oral  PainSc: 6       Patients Stated Pain Goal: 7 (90/90/30 1499)  Complications: No notable events documented.

## 2022-06-05 NOTE — Discharge Instructions (Signed)
EGD Discharge instructions Please read the instructions outlined below and refer to this sheet in the next few weeks. These discharge instructions provide you with general information on caring for yourself after you leave the hospital. Your doctor may also give you specific instructions. While your treatment has been planned according to the most current medical practices available, unavoidable complications occasionally occur. If you have any problems or questions after discharge, please call your doctor. ACTIVITY You may resume your regular activity but move at a slower pace for the next 24 hours.  Take frequent rest periods for the next 24 hours.  Walking will help expel (get rid of) the air and reduce the bloated feeling in your abdomen.  No driving for 24 hours (because of the anesthesia (medicine) used during the test).  You may shower.  Do not sign any important legal documents or operate any machinery for 24 hours (because of the anesthesia used during the test).  NUTRITION Drink plenty of fluids.  You may resume your normal diet.  Begin with a light meal and progress to your normal diet.  Avoid alcoholic beverages for 24 hours or as instructed by your caregiver.  MEDICATIONS You may resume your normal medications unless your caregiver tells you otherwise.  WHAT YOU CAN EXPECT TODAY You may experience abdominal discomfort such as a feeling of fullness or "gas" pains.  FOLLOW-UP Your doctor will discuss the results of your test with you.  SEEK IMMEDIATE MEDICAL ATTENTION IF ANY OF THE FOLLOWING OCCUR: Excessive nausea (feeling sick to your stomach) and/or vomiting.  Severe abdominal pain and distention (swelling).  Trouble swallowing.  Temperature over 101 F (37.8 C).  Rectal bleeding or vomiting of blood.    Your esophagus contained food.  I gently pushed this into your stomach.  Continue to cut your food up into very small bites.  Chew thoroughly.  Drink plenty of water with  meals.  Continue on pantoprazole twice daily.  Follow-up with your GI doctor at [facility]  I hope you have a great rest of your week!  Elon Alas. Abbey Chatters, D.O. Gastroenterology and Hepatology [facility]

## 2022-06-05 NOTE — ED Triage Notes (Signed)
Pt states he has some food from breakfast this morning stuck in his throat.  Hx of same and with recent esophageal dilation. Pt able to swallow saliva without difficultly.

## 2022-06-05 NOTE — Op Note (Signed)
[facility] Patient Name: Maxwell Henderson Procedure Date: 06/05/2022 10:29 PM MRN: [id number] Date of Birth: [birth year] Attending MD: Elon Alas. Abbey Chatters DO CSN: [id number] Age: 56 Admit Type: Outpatient Procedure:                Upper GI endoscopy Indications:              Foreign body in the esophagus Providers:                Elon Alas. Abbey Chatters, DO, Lurline Del, RN, Casimer Bilis, Technician, Ladoris Gene                            Technician, Technician Referring MD:              Medicines:                See the Anesthesia note for documentation of the                            administered medications Complications:            No immediate complications. Estimated Blood Loss:     Estimated blood loss was minimal. Procedure:                Pre-Anesthesia Assessment:                           - The anesthesia plan was to use monitored                            anesthesia care (MAC).                           After obtaining informed consent, the endoscope was                            passed under direct vision. Throughout the                            procedure, the patient's blood pressure, pulse, and                            oxygen saturations were monitored continuously. The                            [id number]-[id number] ([id number]) scope was introduced through the                            mouth, and advanced to the second part of duodenum.                            The upper GI endoscopy was accomplished without                            difficulty. The patient tolerated  the procedure                            well. Scope In: 10:48:58 PM Scope Out: 10:50:50 PM Total Procedure Duration: 0 hours 1 minute 52 seconds  Findings:      A large, submucosal mass with no bleeding and no stigmata of recent       bleeding was found at the gastroesophageal junction extending into the       stomach. The mass was partially obstructing and  circumferential with       associated tight stricture. Stricture was dilated with mild mucosal       disruption and bleeding by passing endoscope through stricture. Food was       found proximal to stricture in the esophagus. Food gently pushed into       stomach.      The duodenal bulb, first portion of the duodenum and second portion of       the duodenum were normal. Impression:               - Partially obstructing, malignant esophageal tumor                            was found at the gastroesophageal junction.                           - Normal duodenal bulb, first portion of the                            duodenum and second portion of the duodenum.                           - No specimens collected. Moderate Sedation:      Per Anesthesia Care Recommendation:           - Patient has a contact number available for                            emergencies. The signs and symptoms of potential                            delayed complications were discussed with the                            patient. Return to normal activities tomorrow.                            Written discharge instructions were provided to the                            patient.                           - Mechanical soft diet.                           - Use Protonix (pantoprazole) 40 mg PO BID.                           -  Follow up with [facility]. Procedure Code(s):        --- Professional ---                           [phone], Esophagogastroduodenoscopy, flexible,                            transoral; diagnostic, including collection of                            specimen(s) by brushing or washing, when performed                            (separate procedure) Diagnosis Code(s):        --- Professional ---                           C16.0, Malignant neoplasm of cardia                           T18.108A, Unspecified foreign body in esophagus                            causing other injury, initial  encounter CPT copyright 2019 [employer] Medical Association. All rights reserved. The codes documented in this report are preliminary and upon coder review may  be revised to meet current compliance requirements. Elon Alas. Abbey Chatters, DO [place] Abbey Chatters, DO 06/05/2022 11:00:33 PM This report has been signed electronically. Number of Addenda: 0

## 2022-06-05 NOTE — ED Provider Notes (Signed)
[facility] EMERGENCY DEPARTMENT Provider Note   CSN: [id number] Arrival date & time: 06/05/22  1956     History  Chief Complaint  Patient presents with   Foreign Body    Maxwell Henderson is a 56 y.o. male.  Patient has a history of esophageal cancer.  He states that he had some breakfast today and got food stuck in esophagus.  He is unable to swallow his own saliva  The history is provided by the patient and medical records. No language interpreter was used.  Foreign Body Intake: Esophagus. Suspected object: Food. Pain quality:  Aching Pain severity:  Mild Timing:  Constant Progression:  Unchanged Chronicity:  New Worsened by:  Nothing Ineffective treatments:  None tried Associated symptoms: no abdominal pain, no congestion, no cough and no ear discharge        Home Medications Prior to Admission medications   Medication Sig Start Date End Date Taking? Authorizing Provider  acetaminophen (TYLENOL) 500 MG tablet Take 1,000 mg by mouth every 6 (six) hours as needed.    [provider]  dicyclomine (BENTYL) 10 MG capsule Take 10 mg by mouth 3 (three) times daily before meals. 08/23/21   [provider]  famotidine (PEPCID) 20 MG tablet Take 20 mg by mouth 2 (two) times daily. 05/22/19   [provider]  Lactulose 20 GM/30ML SOLN Take 30 mLs (20 g total) by mouth 2 (two) times a day. 02/17/19   Derek Jack, MD  methadone (DOLOPHINE) 10 MG tablet Take by mouth See admin instructions. Take 2.5 tablets by mouth 3 times a day 07/15/21   [provider]  metoCLOPramide (REGLAN) 5 MG/5ML solution Take 10 mg by mouth 3 (three) times daily. 10/24/21   [provider]  NARCAN 4 MG/0.1ML LIQD nasal spray kit SMARTSIG:1 Spray(s) Both Nares Once PRN 09/20/21   [provider]  ondansetron (ZOFRAN) 8 MG tablet Take 8 mg by mouth 3 (three) times daily. 09/07/21   [provider]  oxycodone (ROXICODONE) 30 MG immediate release  tablet Take 30 mg by mouth every 4 (four) hours as needed for pain. 07/15/21   [provider]  pantoprazole (PROTONIX) 40 MG tablet Take 1 tablet (40 mg total) by mouth 2 (two) times daily. 07/17/21 03/06/22  Manuella Ghazi, Pratik D, DO  pregabalin (LYRICA) 50 MG capsule Take 50 mg by mouth 3 (three) times daily. 02/10/22   [provider]  sucralfate (CARAFATE) 1 g tablet Take 1 tablet (1 g total) by mouth 4 (four) times daily -  with meals and at bedtime. 07/17/21 11/19/21  Manuella Ghazi, Pratik D, DO  traZODone (DESYREL) 50 MG tablet Take 1 tablet (50 mg total) by mouth at bedtime. 08/14/21   Orson Eva, MD      Allergies    Ketamine and related    Review of Systems   Review of Systems  Constitutional:  Negative for appetite change and fatigue.  HENT:  Negative for congestion, ear discharge and sinus pressure.   Eyes:  Negative for discharge.  Respiratory:  Negative for cough.   Cardiovascular:  Negative for chest pain.  Gastrointestinal:  Negative for abdominal pain and diarrhea.       Food stuck in esophagus  Genitourinary:  Negative for frequency and hematuria.  Musculoskeletal:  Negative for back pain.  Skin:  Negative for rash.  Neurological:  Negative for seizures and headaches.  Psychiatric/Behavioral:  Negative for hallucinations.     Physical Exam Updated Vital Signs BP (!) 148/99 (  BP Location: Right Arm)   Pulse 87   Temp 97.9 F (36.6 C) (Oral)   Resp 16   SpO2 100%  Physical Exam Vitals and nursing note reviewed.  Constitutional:      Appearance: He is well-developed.     Comments: Patient in mild distress  HENT:     Head: Normocephalic.     Nose: Nose normal.  Eyes:     General: No scleral icterus.    Conjunctiva/sclera: Conjunctivae normal.  Neck:     Thyroid: No thyromegaly.  Cardiovascular:     Rate and Rhythm: Normal rate and regular rhythm.     Heart sounds: No murmur heard.    No friction rub. No gallop.  Pulmonary:     Breath sounds: No stridor. No  wheezing or rales.  Chest:     Chest wall: No tenderness.  Abdominal:     General: There is no distension.     Tenderness: There is no abdominal tenderness. There is no rebound.  Musculoskeletal:        General: Normal range of motion.     Cervical back: Neck supple.  Lymphadenopathy:     Cervical: No cervical adenopathy.  Skin:    Findings: No erythema or rash.  Neurological:     Mental Status: He is alert and oriented to person, place, and time.     Motor: No abnormal muscle tone.     Coordination: Coordination normal.  Psychiatric:        Behavior: Behavior normal.     ED Results / Procedures / Treatments   Labs (all labs ordered are listed, but only abnormal results are displayed) Labs Reviewed  I-STAT CHEM 8, ED - Abnormal; Notable for the following components:      Result Value   Hemoglobin 9.2 (*)    HCT 27.0 (*)    All other components within normal limits    EKG None  Radiology No results found.  Procedures Procedures    Medications Ordered in ED Medications  sodium chloride 0.9 % bolus 500 mL (has no administration in time range)  glucagon (human recombinant) (GLUCAGEN) injection 1 mg (1 mg Intravenous Given 06/05/22 2042)    ED Course/ Medical Decision Making/ A&P  Patient with meat impaction in esophagus.  He was given glucagon without success.  GI has been called and will do an endoscopy tonight                         Medical Decision Making Risk Prescription drug management. Decision regarding hospitalization.  This patient presents to the ED for concern of foreign body in esophagus, this involves an extensive number of treatment options, and is a complaint that carries with it a high risk of complications and morbidity.  The differential diagnosis includes foreign body in esophagus, worsening esophageal cancer   Co morbidities that complicate the patient evaluation  Esophageal cancer   Additional history obtained:  Additional history  obtained from friend External records from outside source obtained and reviewed including hospital records   Lab Tests:  I Ordered, and personally interpreted labs.  The pertinent results include: Hemoglobin 11.2   Imaging Studies ordered:  No x-rays  Cardiac Monitoring: / EKG:  The patient was maintained on a cardiac monitor.  I personally viewed and interpreted the cardiac monitored which showed an underlying rhythm of: Normal sinus rhythm   Consultations Obtained:  I requested consultation with the GI,  and discussed lab  and imaging findings as well as pertinent plan - they recommend: Endoscopy   Problem List / ED Course / Critical interventions / Medication management  Foreign body in esophagus, esophageal cancer I ordered medication including glucagon Reevaluation of the patient after these medicines showed that the patient stayed the same I have reviewed the patients home medicines and have made adjustments as needed   Social Determinants of Health:  None   Test / Admission - Considered:  None  Meat impaction in the esophagus he will be seen by GI and have endoscopy        Final Clinical Impression(s) / ED Diagnoses Final diagnoses:  None    Rx / DC Orders ED Discharge Orders     None         Milton Ferguson, MD 06/13/22 [phone]

## 2022-06-05 NOTE — Anesthesia Postprocedure Evaluation (Signed)
Anesthesia Post Note  Patient: Maxwell Henderson  Procedure(s) Performed: ESOPHAGOGASTRODUODENOSCOPY (EGD) WITH PROPOFOL  Patient location during evaluation: PACU Anesthesia Type: General Level of consciousness: awake and alert Pain management: pain level controlled Vital Signs Assessment: post-procedure vital signs reviewed and stable Respiratory status: spontaneous breathing, nonlabored ventilation, respiratory function stable and patient connected to nasal cannula oxygen Cardiovascular status: blood pressure returned to baseline and stable Postop Assessment: no apparent nausea or vomiting Anesthetic complications: no   No notable events documented.   Last Vitals:  Vitals:   06/05/22 2230 06/05/22 2256  BP: (!) 152/95 (!) 124/94  Pulse: 77 97  Resp: 18 16  Temp: 37 C 36.8 C  SpO2: 100% 100%    Last Pain:  Vitals:   06/05/22 2230  TempSrc: Oral  PainSc: [place]

## 2022-06-05 NOTE — Consult Note (Addendum)
Consulting  Provider: Dr. Roderic Palau Primary Care Physician:  System, Provider Not In Primary Gastroenterologist:  Dr. Truddie Crumble [facility])  Reason for Consultation: Food impaction  HPI:  Maxwell Henderson is a 56 y.o. male with a past medical history of stage IV metastatic esophageal adenocarcinoma diagnosed in 2018, currently receiving palliative chemotherapy and radiation at [facility].  History of food impactions in the past.    States he was eating breakfast this morning which got stuck.  Has tried to resolve obstruction with liquids and has been unsuccessful.  Not tolerating secretions or any liquids.  Given glucagon without improvement.  Does note some epigastric discomfort.  No shortness of breath.  Recent EGD at [facility] in 05/24/22 status post esophageal dilation up to 15 mm.  Recommended esophageal stent placement if further dysphagia issues continue.  Past Medical History:  Diagnosis Date   Diabetes mellitus without complication ([place])    Hypertension    Iron deficiency anemia due to chronic blood loss 02/28/2017   Primary esophageal adenocarcinoma ([place]) 01/29/2017    Past Surgical History:  Procedure Laterality Date   ESOPHAGOGASTRODUODENOSCOPY  07/29/2021   focal stenosis at the GE junction s/p dilation. Malignant-appearing mass in proximal stomach just below GE junction.   ESOPHAGOGASTRODUODENOSCOPY (EGD) WITH PROPOFOL N/A 01/16/2017   Procedure: ESOPHAGOGASTRODUODENOSCOPY (EGD) WITH PROPOFOL;  Surgeon: [name], MD;  [facility];  Service: Endoscopy;  Laterality: N/A;  730    ESOPHAGOGASTRODUODENOSCOPY (EGD) WITH PROPOFOL N/A 11/01/2020   Procedure: ESOPHAGOGASTRODUODENOSCOPY (EGD) WITH PROPOFOL;  Surgeon: Daneil Dolin, MD;  Location: [facility];  Service: Endoscopy;  Laterality: N/A;   ESOPHAGOGASTRODUODENOSCOPY (EGD) WITH PROPOFOL N/A 07/17/2021   malignant esophagea tumor at GE junction, oozing gastric ulcer felt to be malignant.    ESOPHAGOGASTRODUODENOSCOPY (EGD) WITH PROPOFOL N/A 11/19/2021   Procedure: ESOPHAGOGASTRODUODENOSCOPY (EGD) WITH PROPOFOL;  Surgeon: Eloise Harman, DO;  [facility];  Service: Endoscopy;  Laterality: N/A;   ESOPHAGOGASTRODUODENOSCOPY (EGD) WITH PROPOFOL N/A 02/10/2022   Procedure: ESOPHAGOGASTRODUODENOSCOPY (EGD) WITH PROPOFOL;  Surgeon: Eloise Harman, DO;  [facility];  Service: Endoscopy;  Laterality: N/A;   IR FLUORO GUIDE PORT INSERTION RIGHT  02/13/2017   IR US GUIDE VASC ACCESS RIGHT  02/13/2017   lipoma removal     PORTA CATH INSERTION Right 02/13/2017   SAVORY DILATION N/A 01/16/2017   Procedure: SAVORY DILATION;  Surgeon: [name], MD;  Location: [facility];  Service: Endoscopy;  Laterality: N/A;    Prior to Admission medications   Medication Sig Start Date End Date Taking? Authorizing Provider  acetaminophen (TYLENOL) 500 MG tablet Take 1,000 mg by mouth every 6 (six) hours as needed.    [provider]  dicyclomine (BENTYL) 10 MG capsule Take 10 mg by mouth 3 (three) times daily before meals. 08/23/21   [provider]  famotidine (PEPCID) 20 MG tablet Take 20 mg by mouth 2 (two) times daily. 05/22/19   [provider]  Lactulose 20 GM/30ML SOLN Take 30 mLs (20 g total) by mouth 2 (two) times a day. 02/17/19   Derek Jack, MD  methadone (DOLOPHINE) 10 MG tablet Take by mouth See admin instructions. Take 2.5 tablets by mouth 3 times a day 07/15/21   [provider]  metoCLOPramide (REGLAN) 5 MG/5ML solution Take 10 mg by mouth 3 (three) times daily. 10/24/21   [provider]  NARCAN 4 MG/0.1ML LIQD nasal spray kit SMARTSIG:1 Spray(s) Both Nares Once PRN 09/20/21  [provider]  ondansetron (ZOFRAN) 8 MG tablet Take 8 mg by mouth 3 (three) times daily. 09/07/21   [provider]  oxycodone (ROXICODONE) 30 MG immediate release tablet Take 30 mg by mouth every 4 (four) hours as  needed for pain. 07/15/21   [provider]  pantoprazole (PROTONIX) 40 MG tablet Take 1 tablet (40 mg total) by mouth 2 (two) times daily. 07/17/21 03/06/22  Manuella Ghazi, Pratik D, DO  pregabalin (LYRICA) 50 MG capsule Take 50 mg by mouth 3 (three) times daily. 02/10/22   [provider]  sucralfate (CARAFATE) 1 g tablet Take 1 tablet (1 g total) by mouth 4 (four) times daily -  with meals and at bedtime. 07/17/21 11/19/21  Manuella Ghazi, Pratik D, DO  traZODone (DESYREL) 50 MG tablet Take 1 tablet (50 mg total) by mouth at bedtime. 08/14/21   Orson Eva, MD    No current facility-administered medications for this encounter.   Current Outpatient Medications  Medication Sig Dispense Refill   acetaminophen (TYLENOL) 500 MG tablet Take 1,000 mg by mouth every 6 (six) hours as needed.     dicyclomine (BENTYL) 10 MG capsule Take 10 mg by mouth 3 (three) times daily before meals.     famotidine (PEPCID) 20 MG tablet Take 20 mg by mouth 2 (two) times daily.     Lactulose 20 GM/30ML SOLN Take 30 mLs (20 g total) by mouth 2 (two) times a day. 450 mL 1   methadone (DOLOPHINE) 10 MG tablet Take by mouth See admin instructions. Take 2.5 tablets by mouth 3 times a day     metoCLOPramide (REGLAN) 5 MG/5ML solution Take 10 mg by mouth 3 (three) times daily.     NARCAN 4 MG/0.1ML LIQD nasal spray kit SMARTSIG:1 Spray(s) Both Nares Once PRN     ondansetron (ZOFRAN) 8 MG tablet Take 8 mg by mouth 3 (three) times daily.     oxycodone (ROXICODONE) 30 MG immediate release tablet Take 30 mg by mouth every 4 (four) hours as needed for pain.     pantoprazole (PROTONIX) 40 MG tablet Take 1 tablet (40 mg total) by mouth 2 (two) times daily. 180 tablet 2   pregabalin (LYRICA) 50 MG capsule Take 50 mg by mouth 3 (three) times daily.     sucralfate (CARAFATE) 1 g tablet Take 1 tablet (1 g total) by mouth 4 (four) times daily -  with meals and at bedtime. 120 tablet 0   traZODone (DESYREL) 50 MG tablet Take 1 tablet (50 mg  total) by mouth at bedtime.      Allergies as of 06/05/2022 - Review Complete 06/05/2022  Allergen Reaction Noted   Ketamine and related Other (See Comments) 05/10/2019    Family History  Problem Relation Age of Onset   Prostate cancer Father    Colon cancer Neg Hx    Colon polyps Neg Hx     Social History   Socioeconomic History   Marital status: Married    Spouse name: Not on file   Number of children: Not on file   Years of education: Not on file   Highest education level: Not on file  Occupational History   Not on file  Tobacco Use   Smoking status: Former    Packs/day: 0.25    Types: Cigarettes    Quit date: 08/14/2016    Years since quitting: 5.8   Smokeless tobacco: Never  Vaping Use   Vaping Use: Never used  Substance and Sexual  Activity   Alcohol use: No    Comment: hx heavy alcohol, hasn't drank in 28 yrs   Drug use: Not Currently    Frequency: 2.0 times per week    Types: Marijuana   Sexual activity: Yes  Other Topics Concern   Not on file  Social History Narrative   WORKING [employer].   Social Determinants of Health   Financial Resource Strain: Unknown (05/03/2019)   Overall Financial Resource Strain (CARDIA)    Difficulty of Paying Living Expenses: Patient refused  Food Insecurity: Unknown (05/03/2019)   Hunger Vital Sign    Worried About Running Out of Food in the Last Year: Patient refused    [place] in the Last Year: Patient refused  Transportation Needs: Unknown (05/03/2019)   PRAPARE - Hydrologist (Medical): Patient refused    Lack of Transportation (Non-Medical): Patient refused  Physical Activity: Unknown (05/03/2019)   Exercise Vital Sign    Days of Exercise per Week: Patient refused    Minutes of Exercise per Session: Patient refused  Stress: Unknown (05/03/2019)   [place]    Feeling of Stress : Patient refused   Social Connections: Unknown (05/03/2019)   Social Connection and Isolation Panel [NHANES]    Frequency of Communication with Friends and Family: Patient refused    Frequency of Social Gatherings with Friends and Family: Patient refused    Attends Religious Services: Patient refused    Active Member of Clubs or Organizations: Patient refused    Attends Archivist Meetings: Patient refused    Marital Status: Patient refused  Intimate Partner Violence: Unknown (05/03/2019)   Humiliation, Afraid, Rape, and Kick questionnaire    Fear of Current or Ex-Partner: Patient refused    Emotionally Abused: Patient refused    Physically Abused: Patient refused    Sexually Abused: Patient refused    Review of Systems: General: Negative for anorexia, weight loss, fever, chills, fatigue, weakness. Eyes: Negative for vision changes.  ENT: Negative for hoarseness, difficulty swallowing , nasal congestion. CV: Negative for chest pain, angina, palpitations, dyspnea on exertion, peripheral edema.  Respiratory: Negative for dyspnea at rest, dyspnea on exertion, cough, sputum, wheezing.  GI: See history of present illness. GU:  Negative for dysuria, hematuria, urinary incontinence, urinary frequency, nocturnal urination.  MS: Negative for joint pain, low back pain.  Derm: Negative for rash or itching.  Neuro: Negative for weakness, abnormal sensation, seizure, frequent headaches, memory loss, confusion.  Psych: Negative for anxiety, depression Endo: Negative for unusual weight change.  Heme: Negative for bruising or bleeding. Allergy: Negative for rash or hives.  Physical Exam: Vital signs in last 24 hours: Temp:  [97.9 F (36.6 C)] 97.9 F (36.6 C) (10/23 2016) Pulse Rate:  [81-87] 81 (10/23 2200) Resp:  [16-18] 18 (10/23 2200) BP: (143-148)/(95-99) 143/95 (10/23 2200) SpO2:  [100 %] 100 % (10/23 2200)   General:   Alert,  Well-developed, well-nourished, pleasant and cooperative in  NAD Head:  Normocephalic and atraumatic. Eyes:  Sclera clear, no icterus.   Conjunctiva pink. Ears:  Normal auditory acuity. Nose:  No deformity, discharge,  or lesions. Mouth:  No deformity or lesions, dentition normal. Neck:  Supple; no masses or thyromegaly. Lungs:  Clear throughout to auscultation.   No wheezes, crackles, or rhonchi. No acute distress. Heart:  Regular rate and rhythm; no murmurs, clicks, rubs,  or gallops. Abdomen:  Soft, nontender and  nondistended. No masses, hepatosplenomegaly or hernias noted. Normal bowel sounds, without guarding, and without rebound.   Msk:  Symmetrical without gross deformities. Normal posture. Pulses:  Normal pulses noted. Extremities:  Without clubbing or edema. Neurologic:  Alert and  oriented x4;  grossly normal neurologically. Skin:  Intact without significant lesions or rashes. Cervical Nodes:  No significant cervical adenopathy. Psych:  Alert and cooperative. Normal mood and affect.  Intake/Output from previous day: No intake/output data recorded. Intake/Output this shift: No intake/output data recorded.  Lab Results: Recent Labs    06/05/22 2052  HGB 9.2*  HCT 27.0*   BMET Recent Labs    06/05/22 2052  NA 139  K 3.6  CL 101  GLUCOSE 98  BUN 7  CREATININE 0.70   LFT No results for input(s): "PROT", "ALBUMIN", "AST", "ALT", "ALKPHOS", "BILITOT", "BILIDIR", "IBILI" in the last 72 hours. PT/INR No results for input(s): "LABPROT", "INR" in the last 72 hours. Hepatitis Panel No results for input(s): "HEPBSAG", "HCVAB", "HEPAIGM", "HEPBIGM" in the last 72 hours. C-Diff No results for input(s): "CDIFFTOX" in the last 72 hours.  Studies/Results: No results found.  IImpression: *Esophageal food impaction *Stage IV esophageal cancer   Plan: Will proceed with urgent EGD to remove food impaction.  The risks including infection, bleed, or perforation as well as benefits, limitations, alternatives and imponderables have  been reviewed with the patient. Potential for esophageal dilation, biopsy, etc. have also been reviewed.  Questions have been answered. All parties agreeable.   Keep patient NPO.     Elon Alas. Abbey Chatters, D.O. Gastroenterology and Hepatology [facility]     LOS: 0 days     06/05/2022, 10:15 PM

## 2022-06-05 NOTE — Anesthesia Preprocedure Evaluation (Signed)
Anesthesia Evaluation  Patient identified by MRN, date of birth, ID band Patient awake    Reviewed: Allergy & Precautions, H&P , NPO status , Patient's Chart, lab work & pertinent test results, reviewed documented beta blocker date and time   Airway Mallampati: II  TM Distance: >3 FB Neck ROM: full    Dental no notable dental hx.    Pulmonary neg pulmonary ROS, former smoker,    Pulmonary exam normal breath sounds clear to auscultation       Cardiovascular Exercise Tolerance: Good hypertension, negative cardio ROS   Rhythm:regular Rate:Normal     Neuro/Psych negative neurological ROS  negative psych ROS   GI/Hepatic Neg liver ROS, PUD, GERD  Medicated,  Endo/Other  negative endocrine ROSdiabetes, Type 2  Renal/GU negative Renal ROS  negative genitourinary   Musculoskeletal   Abdominal   Peds  Hematology  (+) Blood dyscrasia, anemia ,   Anesthesia Other Findings   Reproductive/Obstetrics negative OB ROS                             Anesthesia Physical  Anesthesia Plan  ASA: 4 and emergent  Anesthesia Plan: General   Post-op Pain Management:    Induction:   PONV Risk Score and Plan: Propofol infusion  Airway Management Planned:   Additional Equipment:   Intra-op Plan:   Post-operative Plan:   Informed Consent: I have reviewed the patients History and Physical, chart, labs and discussed the procedure including the risks, benefits and alternatives for the proposed anesthesia with the patient or authorized representative who has indicated his/her understanding and acceptance.     Dental Advisory Given  Plan Discussed with: CRNA  Anesthesia Plan Comments:         Anesthesia Quick Evaluation

## 2022-06-05 NOTE — ED Notes (Signed)
Pt transported to Endo this time.

## 2022-06-19 ENCOUNTER — Encounter (HOSPITAL_COMMUNITY): Payer: Self-pay | Admitting: Internal Medicine

## 2022-10-02 ENCOUNTER — Encounter (HOSPITAL_COMMUNITY): Payer: Self-pay

## 2022-10-02 ENCOUNTER — Emergency Department (HOSPITAL_COMMUNITY): Payer: Medicaid Other

## 2022-10-02 ENCOUNTER — Emergency Department (HOSPITAL_COMMUNITY): Payer: Medicaid Other | Admitting: Anesthesiology

## 2022-10-02 ENCOUNTER — Encounter (HOSPITAL_COMMUNITY)
Admission: EM | Disposition: A | Discharge status: Home / Self Care | Payer: Self-pay | Source: Home / Self Care | Attending: Student

## 2022-10-02 ENCOUNTER — Ambulatory Visit (HOSPITAL_COMMUNITY)
Admission: EM | Admit: 2022-10-02 | Discharge: 2022-10-02 | Disposition: A | Discharge status: Home / Self Care | Payer: Medicaid Other | Attending: Student | Admitting: Student

## 2022-10-02 ENCOUNTER — Other Ambulatory Visit: Payer: Self-pay

## 2022-10-02 DIAGNOSIS — C7889 Secondary malignant neoplasm of other digestive organs: Secondary | ICD-10-CM | POA: Diagnosis not present

## 2022-10-02 DIAGNOSIS — Z87891 Personal history of nicotine dependence: Secondary | ICD-10-CM | POA: Diagnosis not present

## 2022-10-02 DIAGNOSIS — R1110 Vomiting, unspecified: Secondary | ICD-10-CM | POA: Diagnosis not present

## 2022-10-02 DIAGNOSIS — E119 Type 2 diabetes mellitus without complications: Secondary | ICD-10-CM | POA: Insufficient documentation

## 2022-10-02 DIAGNOSIS — W44F3XA Food entering into or through a natural orifice, initial encounter: Secondary | ICD-10-CM | POA: Diagnosis not present

## 2022-10-02 DIAGNOSIS — R1310 Dysphagia, unspecified: Secondary | ICD-10-CM | POA: Insufficient documentation

## 2022-10-02 DIAGNOSIS — T18128A Food in esophagus causing other injury, initial encounter: Secondary | ICD-10-CM | POA: Diagnosis not present

## 2022-10-02 DIAGNOSIS — C160 Malignant neoplasm of cardia: Secondary | ICD-10-CM

## 2022-10-02 DIAGNOSIS — C155 Malignant neoplasm of lower third of esophagus: Secondary | ICD-10-CM | POA: Insufficient documentation

## 2022-10-02 DIAGNOSIS — K259 Gastric ulcer, unspecified as acute or chronic, without hemorrhage or perforation: Secondary | ICD-10-CM | POA: Insufficient documentation

## 2022-10-02 DIAGNOSIS — I10 Essential (primary) hypertension: Secondary | ICD-10-CM | POA: Diagnosis not present

## 2022-10-02 DIAGNOSIS — T18108A Unspecified foreign body in esophagus causing other injury, initial encounter: Secondary | ICD-10-CM

## 2022-10-02 DIAGNOSIS — X58XXXA Exposure to other specified factors, initial encounter: Secondary | ICD-10-CM | POA: Diagnosis not present

## 2022-10-02 DIAGNOSIS — K254 Chronic or unspecified gastric ulcer with hemorrhage: Secondary | ICD-10-CM

## 2022-10-02 HISTORY — PX: ESOPHAGOGASTRODUODENOSCOPY (EGD) WITH PROPOFOL: SHX5813

## 2022-10-02 LAB — BASIC METABOLIC PANEL
Anion gap: 9 (ref 5–15)
BUN: 9 mg/dL (ref 6–20)
CO2: 27 mmol/L (ref 22–32)
Calcium: 7.9 mg/dL — ABNORMAL LOW (ref 8.9–10.3)
Chloride: 101 mmol/L (ref 98–111)
Creatinine, Ser: 0.87 mg/dL (ref 0.61–1.24)
GFR, Estimated: >60 mL/min (ref >60)
Glucose, Bld: 85 mg/dL (ref 70–99)
Potassium: 3.3 mmol/L — ABNORMAL LOW (ref 3.5–5.1)
Sodium: 137 mmol/L (ref 135–145)

## 2022-10-02 LAB — CBC
HCT: 22.9 % — ABNORMAL LOW (ref 39.0–52.0)
Hemoglobin: 7.3 g/dL — ABNORMAL LOW (ref 13.0–17.0)
MCH: 21.6 pg — ABNORMAL LOW (ref 26.0–34.0)
MCHC: 31.9 g/dL (ref 30.0–36.0)
MCV: 67.8 fL — ABNORMAL LOW (ref 80.0–100.0)
Platelets: 255 10*3/uL (ref 150–400)
RBC: 3.38 MIL/uL — ABNORMAL LOW (ref 4.22–5.81)
RDW: 17.4 % — ABNORMAL HIGH (ref 11.5–15.5)
WBC: 4.8 10*3/uL (ref 4.0–10.5)
nRBC: 0 % (ref 0.0–0.2)

## 2022-10-02 SURGERY — ESOPHAGOGASTRODUODENOSCOPY (EGD) WITH PROPOFOL
Anesthesia: General

## 2022-10-02 MED ORDER — PROPOFOL 10 MG/ML IV BOLUS
INTRAVENOUS | Status: DC | PRN
  Administered 2022-10-02: 200 mg via INTRAVENOUS

## 2022-10-02 MED ORDER — SUCCINYLCHOLINE CHLORIDE 20 MG/ML IJ SOLN
INTRAMUSCULAR | Status: DC | PRN
  Administered 2022-10-02: 100 mg via INTRAVENOUS

## 2022-10-02 MED ORDER — PROPOFOL 10 MG/ML IV BOLUS
INTRAVENOUS | Status: AC
  Filled 2022-10-02: qty 20

## 2022-10-02 MED ORDER — LACTATED RINGERS IV SOLN: INTRAVENOUS | Status: DC | PRN

## 2022-10-02 MED ORDER — GLUCAGON HCL RDNA (DIAGNOSTIC) 1 MG IJ SOLR
1.0000 mg | Freq: Once | INTRAMUSCULAR | Status: AC
  Administered 2022-10-02: 1 mg via INTRAVENOUS
  Filled 2022-10-02: qty 1

## 2022-10-02 MED ORDER — SODIUM CHLORIDE 0.9 % IV SOLN: INTRAVENOUS | Status: DC

## 2022-10-02 NOTE — Anesthesia Postprocedure Evaluation (Signed)
Anesthesia Post Note  Patient: Maxwell Henderson  Procedure(s) Performed: ESOPHAGOGASTRODUODENOSCOPY (EGD) WITH PROPOFOL  Patient location during evaluation: PACU Anesthesia Type: General Level of consciousness: awake and alert Pain management: pain level controlled Vital Signs Assessment: post-procedure vital signs reviewed and stable Respiratory status: spontaneous breathing, nonlabored ventilation, respiratory function stable and patient connected to nasal cannula oxygen Cardiovascular status: blood pressure returned to baseline and stable Postop Assessment: no apparent nausea or vomiting Anesthetic complications: no   No notable events documented.   Last Vitals:  Vitals:   10/02/22 2154 10/02/22 2241  BP: 132/88 128/86  Pulse: 70 82  Resp: 12 13  Temp: 36.5 C 36.5 C  SpO2: 100% 100%    Last Pain:  Vitals:   10/02/22 2154  TempSrc: Oral  PainSc: 0-No pain                 Louann Sjogren

## 2022-10-02 NOTE — Discharge Instructions (Signed)
You are being discharged to home.  Take a full liquid diet. No solid or soft/pureed food , except ice cream and apple sauce until esophageal stent or G/J tube are placed. Can do protein shakes 3 times a day. Follow up with Dr. Truddie Crumble at [facility].

## 2022-10-02 NOTE — Op Note (Signed)
[facility] Patient Name: Maxwell Henderson Procedure Date: 10/02/2022 9:33 PM MRN: [id number] Date of Birth: [birth year] Attending MD: [name] , , [id number] CSN: [id number] Age: 57 Admit Type: Outpatient Procedure:                Upper GI endoscopy Indications:              Foreign body in the esophagus Providers:                [name], Caprice Kluver, Aram Candela Referring MD:              Medicines:                General Anesthesia Complications:            No immediate complications. Estimated Blood Loss:     Estimated blood loss: none. Procedure:                Pre-Anesthesia Assessment:                           - Prior to the procedure, a History and Physical                            was performed, and patient medications, allergies                            and sensitivities were reviewed. The patient's                            tolerance of previous anesthesia was reviewed.                           - The risks and benefits of the procedure and the                            sedation options and risks were discussed with the                            patient. All questions were answered and informed                            consent was obtained.                           - ASA Grade Assessment: IV - A patient with severe                            systemic disease that is a constant threat to life.                           After obtaining informed consent, the endoscope was                            passed under direct vision. Throughout the  procedure, the patient's blood pressure, pulse, and                            oxygen saturations were monitored continuously. The                            [id number] scope was introduced through the                            mouth, and advanced to the lower third of                            esophagus. The patient tolerated the procedure                            well.  The [id number] scope was introduced                            through the mouth, and advanced to the second part                            of duodenum. The upper GI endoscopy was performed                            with moderate difficulty due to narrowing. Scope In: 10:16:30 PM Scope Out: 10:33:21 PM Total Procedure Duration: 0 hours 16 minutes 51 seconds  Findings:      Food was found in the distal esophagus. Removal was accomplished with a       Roth net. As there was presence of a piece of food impacted at the area       of the tumor, this could be gently pushed inside of the gastric cavity       with the ultraslim endoscope.      A large, ulcerating mass with no bleeding and stigmata of recent       bleeding was found at the gastroesophageal junction, 38 to 40 cm from       the incisors. Small pieces of food were impacted in the lumen. The mass       was partially obstructing and circumferential. The mass could not be       traversed with the pediatric scope. I switched to an ultraslim endoscope       and was able to pass the narrowed lumen.      One severely large non-obstructing oozing cratered gastric ulcer was       found in the cardia, which was extending from the GE junction to the       stomach, likely tumor invasion. There is no evidence of perforation.      The examined duodenum was normal. Impression:               - Food in the distal esophagus. Removal was                            successful.                           -  Partially obstructing, malignant esophageal tumor                            was found at the gastroesophageal junction.                            Significant narrowing of the lumen, traversed with                            ultraslim endoscope                           - Non-obstructing oozing gastric ulcer extending                            from esophagus, likely malignant. There is no                            evidence of  perforation.                           - Normal examined duodenum. Moderate Sedation:      Per Anesthesia Care Recommendation:           - The patient will be observed post-procedure,                            until all discharge criteria are met.                           - Discharge patient to home (ambulatory).                           - Full liquid diet. No solid or soft/pureed food ,                            except ice cream and apple sauce until esophageal                            stent or G/J tube are placed. Can do protein shakes                            3 times a day.                           - Follow up with Dr. Truddie Crumble at [facility]. Procedure Code(s):        --- Professional ---                           [phone], Esophagogastroduodenoscopy, flexible,                            transoral; with removal of foreign body(s) Diagnosis Code(s):        --- Professional ---  [id number], Food in esophagus causing other injury,                            initial encounter                           C16.0, Malignant neoplasm of cardia                           K25.4, Chronic or unspecified gastric ulcer with                            hemorrhage                           T18.108A, Unspecified foreign body in esophagus                            causing other injury, initial encounter CPT copyright 2022 [employer] Association. All rights reserved. The codes documented in this report are preliminary and upon coder review may  be revised to meet current compliance requirements. [name], MD [name],  10/02/2022 10:46:41 PM This report has been signed electronically. Number of Addenda: 0

## 2022-10-02 NOTE — Consult Note (Signed)
Maxwell Henderson, M.D. Gastroenterology & Hepatology                                           Patient Name: Maxwell Henderson Account #: @FLAACCTNO$ @   MRN: [id number] Admission Date: 10/02/2022 Date of Evaluation:  10/02/2022 Time of Evaluation: 9:17 PM   Referring Physician: Teressa Lower, MD  Chief Complaint:  food impaction  HPI:  This is a 57 y.o. 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 The patient reports he has been eating pured food most of the time.  He reports that he is last food intake was around 2 PM today and he felt he was not able to swallow afterwards as food was  stuck in the retrosternal area.  He reports that he has been eating mostly liquefied food but sometimes he eats protein like sausages or ground beef after grinding esophagogastroduodenospy in a blender.  Drooling: No Able to swallow food: No Able to drink liquids"no Previous reflux symptoms: Denies Weight changes: Significant weight loss  Previous episodes: Had at least 3 episodes of food impaction  Patient has been evaluated at [facility] by Dr. Truddie Crumble.  He underwent an EGD on 09/11/2022, was found to have an esophageal cancer at 46 centimeters from incisors with an associated ulcer extending to the cardia due to tumor ulceration.  A savary dilation was performed up to 12.8 mm.  Notably, he is planned to have an esophageal stent placed by Dr. Truddie Crumble but he is supposed to have this done at the end of March as the stent need to be designed for the size of the lumen.   In the ER, his vital signs were stable. He was protecting his airway. Labs were notable for hemoglobin of 7.3, platelets 255, BMP was pending,.  CT of the neck without contrast showed dilation of the upper thoracic esophagus with air-fluid level.   Past Medical History: SEE  CHRONIC ISSSUES: Past Medical History:  Diagnosis Date   Diabetes mellitus without complication ([place])    Hypertension    Iron deficiency anemia due to chronic blood loss 02/28/2017   Primary esophageal adenocarcinoma ([place]) 01/29/2017   Past Surgical History:  Past Surgical History:  Procedure Laterality Date   ESOPHAGOGASTRODUODENOSCOPY  07/29/2021   focal stenosis at the GE junction s/p dilation. Malignant-appearing mass in proximal stomach just below GE junction.   ESOPHAGOGASTRODUODENOSCOPY (EGD) WITH PROPOFOL N/A 01/16/2017   Procedure: ESOPHAGOGASTRODUODENOSCOPY (EGD) WITH PROPOFOL;  Surgeon: [name], MD;  [facility];  Service: Endoscopy;  Laterality: N/A;  730    ESOPHAGOGASTRODUODENOSCOPY (EGD) WITH PROPOFOL N/A 11/01/2020   Procedure: ESOPHAGOGASTRODUODENOSCOPY (EGD) WITH PROPOFOL;  Surgeon: Daneil Dolin, MD;  Location: [facility];  Service: Endoscopy;  Laterality: N/A;   ESOPHAGOGASTRODUODENOSCOPY (EGD) WITH PROPOFOL N/A 07/17/2021   malignant esophagea tumor at GE junction, oozing gastric ulcer felt to be malignant.   ESOPHAGOGASTRODUODENOSCOPY (EGD) WITH PROPOFOL N/A 11/19/2021   Procedure: ESOPHAGOGASTRODUODENOSCOPY (EGD) WITH PROPOFOL;  Surgeon: Eloise Harman, DO;  [facility];  Service: Endoscopy;  Laterality: N/A;   ESOPHAGOGASTRODUODENOSCOPY (EGD) WITH PROPOFOL N/A 02/10/2022   Procedure: ESOPHAGOGASTRODUODENOSCOPY (EGD) WITH PROPOFOL;  Surgeon: Eloise Harman, DO;  [facility];  Service: Endoscopy;  Laterality: N/A;   ESOPHAGOGASTRODUODENOSCOPY (EGD) WITH PROPOFOL N/A 06/05/2022   Procedure: ESOPHAGOGASTRODUODENOSCOPY (EGD) WITH PROPOFOL;  Surgeon: Eloise Harman, DO;  [facility];  Service: Endoscopy;  Laterality: N/A;   IR FLUORO GUIDE PORT INSERTION RIGHT  02/13/2017   IR US GUIDE VASC ACCESS RIGHT  02/13/2017   lipoma removal     PORTA CATH INSERTION Right 02/13/2017   SAVORY DILATION N/A 01/16/2017    Procedure: SAVORY DILATION;  Surgeon: [name], MD;  [facility];  Service: Endoscopy;  Laterality: N/A;   Family History:  Family History  Problem Relation Age of Onset   Prostate cancer Father    Colon cancer Neg Hx    Colon polyps Neg Hx    Social History:  Social History   Tobacco Use   Smoking status: Former    Packs/day: 0.25    Types: Cigarettes    Quit date: 08/14/2016    Years since quitting: 6.1   Smokeless tobacco: Never  Vaping Use   Vaping Use: Never used  Substance Use Topics   Alcohol use: No    Comment: hx heavy alcohol, hasn't drank in 28 yrs   Drug use: Not Currently    Frequency: 2.0 times per week    Types: Marijuana    Home Medications:  Prior to Admission medications   Medication Sig Start Date End Date Taking? Authorizing Provider  acetaminophen (TYLENOL) 500 MG tablet Take 1,000 mg by mouth every 6 (six) hours as needed.    [provider]  dicyclomine (BENTYL) 10 MG capsule Take 10 mg by mouth 3 (three) times daily before meals. 08/23/21   [provider]  famotidine (PEPCID) 20 MG tablet Take 20 mg by mouth 2 (two) times daily. 05/22/19   [provider]  Lactulose 20 GM/30ML SOLN Take 30 mLs (20 g total) by mouth 2 (two) times a day. 02/17/19   Derek Jack, MD  methadone (DOLOPHINE) 10 MG tablet Take by mouth See admin instructions. Take 2.5 tablets by mouth 3 times a day 07/15/21   [provider]  metoCLOPramide (REGLAN) 5 MG/5ML solution Take 10 mg by mouth 3 (three) times daily. 10/24/21   [provider]  NARCAN 4 MG/0.1ML LIQD nasal spray kit SMARTSIG:1 Spray(s) Both Nares Once PRN 09/20/21   [provider]  ondansetron (ZOFRAN) 8 MG tablet Take 8 mg by mouth 3 (three) times daily. 09/07/21   [provider]  oxycodone (ROXICODONE) 30 MG immediate release tablet Take 30 mg by mouth every 4 (four) hours as needed for pain. 07/15/21   [provider]   pantoprazole (PROTONIX) 40 MG tablet Take 1 tablet (40 mg total) by mouth 2 (two) times daily. 07/17/21 03/06/22  Manuella Ghazi, Pratik D, DO  pregabalin (LYRICA) 50 MG capsule Take 50 mg by mouth 3 (three) times daily. 02/10/22   [provider]  sucralfate (CARAFATE) 1 g tablet Take 1 tablet (1 g total) by mouth 4 (four) times daily -  with meals and at bedtime. 07/17/21 11/19/21  Manuella Ghazi, Pratik D, DO  traZODone (DESYREL) 50 MG tablet Take 1 tablet (50 mg total) by mouth at bedtime. 08/14/21   Orson Eva, MD    Inpatient Medications: No current facility-administered medications for this encounter.  Current Outpatient Medications:    acetaminophen (TYLENOL) 500 MG tablet, Take 1,000 mg by mouth every 6 (six) hours as needed., Disp: , Rfl:    dicyclomine (BENTYL) 10 MG capsule, Take 10 mg  by mouth 3 (three) times daily before meals., Disp: , Rfl:    famotidine (PEPCID) 20 MG tablet, Take 20 mg by mouth 2 (two) times daily., Disp: , Rfl:    Lactulose 20 GM/30ML SOLN, Take 30 mLs (20 g total) by mouth 2 (two) times a day., Disp: 450 mL, Rfl: 1   methadone (DOLOPHINE) 10 MG tablet, Take by mouth See admin instructions. Take 2.5 tablets by mouth 3 times a day, Disp: , Rfl:    metoCLOPramide (REGLAN) 5 MG/5ML solution, Take 10 mg by mouth 3 (three) times daily., Disp: , Rfl:    NARCAN 4 MG/0.1ML LIQD nasal spray kit, SMARTSIG:1 Spray(s) Both Nares Once PRN, Disp: , Rfl:    ondansetron (ZOFRAN) 8 MG tablet, Take 8 mg by mouth 3 (three) times daily., Disp: , Rfl:    oxycodone (ROXICODONE) 30 MG immediate release tablet, Take 30 mg by mouth every 4 (four) hours as needed for pain., Disp: , Rfl:    pantoprazole (PROTONIX) 40 MG tablet, Take 1 tablet (40 mg total) by mouth 2 (two) times daily., Disp: 180 tablet, Rfl: 2   pregabalin (LYRICA) 50 MG capsule, Take 50 mg by mouth 3 (three) times daily., Disp: , Rfl:    sucralfate (CARAFATE) 1 g tablet, Take 1 tablet (1 g total) by mouth 4 (four) times daily -  with  meals and at bedtime., Disp: 120 tablet, Rfl: 0   traZODone (DESYREL) 50 MG tablet, Take 1 tablet (50 mg total) by mouth at bedtime., Disp: , Rfl:  Allergies: Ketamine and related  Complete Review of Systems: GENERAL: negative for malaise, night sweats HEENT: No changes in hearing or vision, no nose bleeds or other nasal problems. NECK: Negative for lumps, goiter, pain and significant neck swelling RESPIRATORY: Negative for cough, wheezing CARDIOVASCULAR: Negative for chest pain, leg swelling, palpitations, orthopnea GI: SEE HPI MUSCULOSKELETAL: Negative for joint pain or swelling, back pain, and muscle pain. SKIN: Negative for lesions, rash PSYCH: Negative for sleep disturbance, mood disorder and recent psychosocial stressors. HEMATOLOGY Negative for prolonged bleeding, bruising easily, and swollen nodes. ENDOCRINE: Negative for cold or heat intolerance, polyuria, polydipsia and goiter. NEURO: negative for tremor, gait imbalance, syncope and seizures. The remainder of the review of systems is noncontributory.  Physical Exam: BP (!) 124/95 (BP Location: Left Arm)   Pulse 71   Temp 98.6 F (37 C)   Resp 17   Ht 6' 1"$  (1.854 m)   Wt 78.5 kg   SpO2 100%   BMI 22.82 kg/m  GENERAL: The patient is AO x3, in no acute distress. HEENT: Head is normocephalic and atraumatic. EOMI are intact. Mouth is well hydrated and without lesions. NECK: Supple. No masses LUNGS: Clear to auscultation. No presence of rhonchi/wheezing/rales. Adequate chest expansion HEART: RRR, normal s1 and s2. ABDOMEN: Soft, nontender, no guarding, no peritoneal signs, and nondistended. BS +. No masses. EXTREMITIES: Without any cyanosis, clubbing, rash, lesions or edema. NEUROLOGIC: AOx3, no focal motor deficit. SKIN: no jaundice, no rashes  Laboratory Data CBC:     Component Value Date/Time   WBC 4.8 10/02/2022 2039   RBC 3.38 (L) 10/02/2022 2039   HGB 7.3 (L) 10/02/2022 2039   HCT 22.9 (L) 10/02/2022 2039    PLT 255 10/02/2022 2039   MCV 67.8 (L) 10/02/2022 2039   MCH 21.6 (L) 10/02/2022 2039   MCHC 31.9 10/02/2022 2039   RDW 17.4 (H) 10/02/2022 2039   LYMPHSABS 0.3 (L) 03/06/2022 1230   MONOABS 0.1 03/06/2022 1230  EOSABS 0.1 03/06/2022 1230   BASOSABS 0.0 03/06/2022 1230   COAG:  Lab Results  Component Value Date   INR 1.05 02/13/2017   INR 1.07 02/02/2017    BMP:     Latest Ref Rng & Units 06/05/2022    8:52 PM 03/06/2022   10:39 AM 02/10/2022    6:48 PM  BMP  Glucose 70 - 99 mg/dL 98  122  109   BUN 6 - 20 mg/dL 7  12  8   $ Creatinine 0.61 - 1.24 mg/dL 0.70  0.82  0.92   Sodium 135 - 145 mmol/L 139  137  138   Potassium 3.5 - 5.1 mmol/L 3.6  3.8  3.5   Chloride 98 - 111 mmol/L 101  104  108   CO2 22 - 32 mmol/L  28  26   Calcium 8.9 - 10.3 mg/dL  8.2  8.5     HEPATIC:     Latest Ref Rng & Units 03/06/2022   10:39 AM 08/11/2021    4:16 AM 08/10/2021    4:18 AM  Hepatic Function  Total Protein 6.5 - 8.1 g/dL 6.7  5.7  5.2   Albumin 3.5 - 5.0 g/dL 3.1  2.9  2.7   AST 15 - 41 U/L 19  14  13   $ ALT 0 - 44 U/L 18  11  9   $ Alk Phosphatase 38 - 126 U/L 68  50  44   Total Bilirubin 0.3 - 1.2 mg/dL 0.8  0.6  1.0     CARDIAC:  Lab Results  Component Value Date   TROPONINI <0.03 02/02/2019     Imaging: I personally reviewed and interpreted the available imaging.  Assessment & Plan: 58 y.o. 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  When patient was related to his mass at the GE junction with severe luminal narrowing.  This will need to be managed with an EGD under general anesthesia.  For now, we'll give to keep the patient nothing by mouth until the procedure is performed.  I advised the patient that he should stay on liquid only diet after the procedure is performed until his stent is placed.   Ultimately, if this is not possible he may need to have a surgical gastrostomy tube placement for palliative purposes.  - Keep NPO - Emergent EGD - Anesthesia evaluation for general anesthesia - high risk of aspiration  Maxwell Peppers, MD Gastroenterology and [facility]

## 2022-10-02 NOTE — Anesthesia Preprocedure Evaluation (Signed)
Anesthesia Evaluation  Patient identified by MRN, date of birth, ID band Patient awake    Reviewed: Allergy & Precautions, H&P , NPO status , Patient's Chart, lab work & pertinent test results, reviewed documented beta blocker date and time   Airway Mallampati: II  TM Distance: >3 FB Neck ROM: full    Dental no notable dental hx.    Pulmonary neg pulmonary ROS, pneumonia, former smoker   Pulmonary exam normal breath sounds clear to auscultation       Cardiovascular Exercise Tolerance: Good hypertension, negative cardio ROS  Rhythm:regular Rate:Normal     Neuro/Psych negative neurological ROS  negative psych ROS   GI/Hepatic negative GI ROS, Neg liver ROS, PUD,GERD  ,,  Endo/Other  negative endocrine ROSdiabetes    Renal/GU negative Renal ROS  negative genitourinary   Musculoskeletal   Abdominal   Peds  Hematology negative hematology ROS (+) Blood dyscrasia, anemia   Anesthesia Other Findings   Reproductive/Obstetrics negative OB ROS                             Anesthesia Physical Anesthesia Plan  ASA: 3 and emergent  Anesthesia Plan: General   Post-op Pain Management:    Induction:   PONV Risk Score and Plan: Propofol infusion  Airway Management Planned:   Additional Equipment:   Intra-op Plan:   Post-operative Plan:   Informed Consent: I have reviewed the patients History and Physical, chart, labs and discussed the procedure including the risks, benefits and alternatives for the proposed anesthesia with the patient or authorized representative who has indicated his/her understanding and acceptance.     Dental Advisory Given  Plan Discussed with: CRNA  Anesthesia Plan Comments:        Anesthesia Quick Evaluation

## 2022-10-02 NOTE — ED Notes (Signed)
Dr. Jenetta Downer at bedside

## 2022-10-02 NOTE — Transfer of Care (Signed)
Immediate Anesthesia Transfer of Care Note  Patient: Maxwell Henderson  Procedure(s) Performed: ESOPHAGOGASTRODUODENOSCOPY (EGD) WITH PROPOFOL  Patient Location: PACU  Anesthesia Type:General  Level of Consciousness: awake and alert   Airway & Oxygen Therapy: Patient Spontanous Breathing  Post-op Assessment: Report given to RN and Post -op Vital signs reviewed and stable  Post vital signs: Reviewed and stable  Last Vitals:  Vitals Value Taken Time  BP 128/86 10/02/22 2241  Temp 36.5 C 10/02/22 2241  Pulse 77 10/02/22 2243  Resp 12 10/02/22 2243  SpO2 100 % 10/02/22 2243  Vitals shown include unvalidated device data.  Last Pain:  Vitals:   10/02/22 2154  TempSrc: Oral  PainSc: 0-No pain      Patients Stated Pain Goal: 5 ([id number])  Complications: No notable events documented.

## 2022-10-02 NOTE — ED Provider Notes (Signed)
[facility] Provider Note   CSN: [id number] Arrival date & time: 10/02/22  1410     History  Chief Complaint  Patient presents with   Swallowed Foreign Body    Maxwell Henderson is a 57 y.o. male with history of malignant neoplasm of the lower third of his esophagus with resultant problems with esophageal dysphagia and frequent episodes of esophageal stricture and food impaction presenting for evaluation of inability to pass any p.o. intake since early today.  He last tried to drink some water at 2 PM today and he probably regurgitated the water, he has since had intermittent episodes of needing to review regurgitate quantities of saliva.  He denies shortness of breath, cough, chest pain.  He last underwent an esophageal dilatation on January 29 at [facility], he states that that point he was "measured" for an esophageal stent and he supposed to go back next month to have this placed.  He describes being very careful about what he eats, he pures most of his food, stating the most solid food he consumes include noodles.  The history is provided by the patient and the spouse.       Home Medications Prior to Admission medications   Medication Sig Start Date End Date Taking? Authorizing Provider  acetaminophen (TYLENOL) 500 MG tablet Take 1,000 mg by mouth every 6 (six) hours as needed.    [provider]  dicyclomine (BENTYL) 10 MG capsule Take 10 mg by mouth 3 (three) times daily before meals. 08/23/21   [provider]  famotidine (PEPCID) 20 MG tablet Take 20 mg by mouth 2 (two) times daily. 05/22/19   [provider]  Lactulose 20 GM/30ML SOLN Take 30 mLs (20 g total) by mouth 2 (two) times a day. 02/17/19   Derek Jack, MD  methadone (DOLOPHINE) 10 MG tablet Take by mouth See admin instructions. Take 2.5 tablets by mouth 3 times a day 07/15/21   [provider]  metoCLOPramide (REGLAN) 5 MG/5ML solution  Take 10 mg by mouth 3 (three) times daily. 10/24/21   [provider]  NARCAN 4 MG/0.1ML LIQD nasal spray kit SMARTSIG:1 Spray(s) Both Nares Once PRN 09/20/21   [provider]  ondansetron (ZOFRAN) 8 MG tablet Take 8 mg by mouth 3 (three) times daily. 09/07/21   [provider]  oxycodone (ROXICODONE) 30 MG immediate release tablet Take 30 mg by mouth every 4 (four) hours as needed for pain. 07/15/21   [provider]  pantoprazole (PROTONIX) 40 MG tablet Take 1 tablet (40 mg total) by mouth 2 (two) times daily. 07/17/21 03/06/22  Manuella Ghazi, Pratik D, DO  pregabalin (LYRICA) 50 MG capsule Take 50 mg by mouth 3 (three) times daily. 02/10/22   [provider]  sucralfate (CARAFATE) 1 g tablet Take 1 tablet (1 g total) by mouth 4 (four) times daily -  with meals and at bedtime. 07/17/21 11/19/21  Manuella Ghazi, Pratik D, DO  traZODone (DESYREL) 50 MG tablet Take 1 tablet (50 mg total) by mouth at bedtime. 08/14/21   Orson Eva, MD      Allergies    Ketamine and related    Review of Systems   Review of Systems  Constitutional:  Negative for chills and fever.  HENT:  Positive for trouble swallowing. Negative for congestion and sore throat.   Eyes: Negative.   Respiratory:  Negative for cough, choking, chest tightness and shortness of breath.   Cardiovascular:  Negative  for chest pain.  Gastrointestinal:  Positive for vomiting. Negative for abdominal pain and nausea.  Genitourinary: Negative.   Musculoskeletal:  Negative for arthralgias, joint swelling and neck pain.  Skin: Negative.  Negative for rash and wound.  Neurological:  Negative for dizziness, weakness, light-headedness, numbness and headaches.  Psychiatric/Behavioral: Negative.    All other systems reviewed and are negative.   Physical Exam Updated Vital Signs BP (!) 124/95 (BP Location: Left Arm)   Pulse 71   Temp 98.6 F (37 C)   Resp 17   Ht 6' 1"$  (1.854 m)   Wt 78.5 kg   SpO2 100%   BMI 22.82 kg/m   Physical Exam Vitals and nursing note reviewed.  Constitutional:      Appearance: He is well-developed.  HENT:     Head: Normocephalic and atraumatic.  Eyes:     Conjunctiva/sclera: Conjunctivae normal.  Cardiovascular:     Rate and Rhythm: Normal rate and regular rhythm.     Heart sounds: Normal heart sounds.  Pulmonary:     Effort: Pulmonary effort is normal.     Breath sounds: Normal breath sounds. No wheezing or rhonchi.  Abdominal:     General: Bowel sounds are normal.     Palpations: Abdomen is soft.     Tenderness: There is no abdominal tenderness. There is no guarding.  Musculoskeletal:        General: Normal range of motion.     Cervical back: Normal range of motion.  Skin:    General: Skin is warm and dry.  Neurological:     Mental Status: He is alert.     ED Results / Procedures / Treatments   Labs (all labs ordered are listed, but only abnormal results are displayed) Labs Reviewed  CBC - Abnormal; Notable for the following components:      Result Value   RBC 3.38 (*)    Hemoglobin 7.3 (*)    HCT 22.9 (*)    MCV 67.8 (*)    MCH 21.6 (*)    RDW 17.4 (*)    All other components within normal limits  BASIC METABOLIC PANEL - Abnormal; Notable for the following components:   Potassium 3.3 (*)    Calcium 7.9 (*)    All other components within normal limits    EKG None  Radiology CT Soft Tissue Neck Wo Contrast  Result Date: 10/02/2022 CLINICAL DATA:  Swallowed foreign body. History of esophageal cancer. Something stuck in the throat since yesterday. EXAM: CT NECK WITHOUT CONTRAST TECHNIQUE: Multidetector CT imaging of the neck was performed following the standard protocol without intravenous contrast. RADIATION DOSE REDUCTION: This exam was performed according to the departmental dose-optimization program which includes automated exposure control, adjustment of the mA and/or kV according to patient size and/or use of iterative reconstruction technique.  COMPARISON:  Radiography same day FINDINGS: Pharynx and larynx: No evidence of mucosal or submucosal mass lesion. Patient does have some dilatation of the upper thoracic esophagus with an air-fluid level. Distal esophageal obstruction or esophageal dysmotility are possible. Salivary glands: Parotid and submandibular glands are normal. Thyroid: No thyroid lesion. Lymph nodes: No lymphadenopathy on either side of the neck. Vascular: Mild atherosclerotic calcification at both carotid bifurcations. Limited intracranial: Limited.  Negative. Visualized orbits: Limited.  Negative. Mastoids and visualized paranasal sinuses: Clear Skeleton: Cervical spondylosis.  Kyphotic curvature of the spine. Upper chest: Lung apices are clear. Other: None IMPRESSION: 1. No evidence of mucosal or submucosal mass lesion. No foreign object  seen in the neck. 2. Dilatation of the upper thoracic esophagus with an air-fluid level. Distal esophageal obstruction or esophageal dysmotility are possible. Electronically Signed   By: Nelson Chimes M.D.   On: 10/02/2022 17:56   DG Neck Soft Tissue  Result Date: 10/02/2022 CLINICAL DATA:  Sensation of foreign body in throat. History esophageal carcinoma. EXAM: NECK SOFT TISSUES - 1+ VIEW COMPARISON:  12/06/2008 FINDINGS: No abnormal foreign body or significant prevertebral soft tissue swelling identified. The visualized airway appears normally patent. There is a cervical kyphosis present with loss of lordosis secondary to degenerative disc disease of the cervical spine. Port-A-Cath present. IMPRESSION: No abnormal foreign body identified. 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 Electronically Signed   By: Aletta Edouard M.D.   On: 10/02/2022 16:48    Procedures Procedures    Medications Ordered in ED Medications  0.9 %  sodium chloride infusion (has no administration in time range)  glucagon (human recombinant) (GLUCAGEN)  injection 1 mg (1 mg Intravenous Given 10/02/22 2040)    ED Course/ Medical Decision Making/ A&P                             Medical Decision Making Patient with known esophageal cancer, frequent problems with esophageal dysmotility and food impactions.  Has been unable to tolerate any p.o. intake with regurgitation of water and saliva since around 2:00 today.  I suggested a trial of glucagon with this patient, he states that never works for him and was upset that I did not already know that from looking at his chart.  I explained to him that we need to at least try this medicine, on the chance that it would work this time.  Review of chart indicating he actually underwent an esophageal dilatation on January 29, pending esophageal stent placement at this time.  Amount and/or Complexity of Data Reviewed Labs: ordered.    Details: Basic labs have been ordered, he has a hemoglobin of 7.3, his last hemoglobin from here was 9.3, this was 3 months ago.  He was 8.0 12 days ago at [facility]. Radiology: ordered.    Details: Dilatation of the upper thoracic esophagus with an air-fluid level suggesting probable obstruction, per CT imaging. Discussion of management or test interpretation with external provider(s): Patient discussed with Dr. Jenetta Downer of GI who will see this patient here.           Final Clinical Impression(s) / ED Diagnoses Final diagnoses:  Malignant neoplasm of lower third of esophagus ([place])  Esophageal obstruction due to food impaction    Rx / DC Orders ED Discharge Orders     None         Landis Martins 10/02/22 2316    Teressa Lower, MD 10/03/22 [phone]

## 2022-10-02 NOTE — ED Triage Notes (Signed)
Patient has hx of esophageal cancer and feels like something is stuck in throat.  Reports it happened yesterday and is unable to get water to go down.  Patient handlling secretions and no respiratory distress.

## 2022-10-02 NOTE — Anesthesia Procedure Notes (Signed)
Procedure Name: Intubation Date/Time: 10/02/2022 10:16 PM  Performed by: Louann Sjogren, MDPre-anesthesia Checklist: Patient identified, Emergency Drugs available, Suction available and Patient being monitored Patient Re-evaluated:Patient Re-evaluated prior to induction Oxygen Delivery Method: Circle system utilized Preoxygenation: Pre-oxygenation with 100% oxygen Induction Type: IV induction Ventilation: Mask ventilation without difficulty Laryngoscope Size: Mac and 3 Grade View: Grade III Tube type: Oral Tube size: 7.0 mm Number of attempts: 1 Airway Equipment and Method: Stylet and Oral airway Placement Confirmation: ETT inserted through vocal cords under direct vision, positive ETCO2 and breath sounds checked- equal and bilateral Tube secured with: Tape Dental Injury: Teeth and Oropharynx as per pre-operative assessment

## 2022-10-02 NOTE — ED Provider Triage Note (Signed)
Emergency Medicine Provider Triage Evaluation Note  Maxwell Henderson , a 57 y.o. male  was evaluated in triage.  Pt complains of swallowed foreign body.  Patient reports that he has a sensation as if he has something stuck in his throat that began earlier today.  He reports he is unable to tolerate swallowing liquids or food at this time.  Patient is not actively drooling and is able to control secretions.  Patient has a history of esophageal cancer and feels that this may be similar to when he was previously diagnosed with this.  Review of Systems  Positive: As above Negative: As above  Physical Exam  BP 128/88 (BP Location: Right Arm)   Pulse 77   Temp 98.4 F (36.9 C) (Oral)   Resp 16   Ht 6' 1"$  (1.854 m)   Wt 78.5 kg   SpO2 99%   BMI 22.82 kg/m  Gen:   Awake, no distress   Resp:  Normal effort  MSK:   Moves extremities without difficulty  Other:    Medical Decision Making  Medically screening exam initiated at 6:06 PM.  Appropriate orders placed.  Kolby Garron was informed that the remainder of the evaluation will be completed by another provider, this initial triage assessment does not replace that evaluation, and the importance of remaining in the ED until their evaluation is complete.     Luvenia Heller, PA-C 10/02/22 1807

## 2022-10-06 ENCOUNTER — Encounter (HOSPITAL_COMMUNITY): Payer: Self-pay | Admitting: Gastroenterology

## 2023-01-11 ENCOUNTER — Emergency Department (HOSPITAL_COMMUNITY): Payer: Medicaid Other

## 2023-01-11 ENCOUNTER — Encounter (HOSPITAL_COMMUNITY): Payer: Self-pay | Admitting: *Deleted

## 2023-01-11 ENCOUNTER — Emergency Department (HOSPITAL_COMMUNITY)
Admission: EM | Admit: 2023-01-11 | Discharge: 2023-01-11 | Disposition: A | Discharge status: Home / Self Care | Payer: Medicaid Other | Attending: Emergency Medicine | Admitting: Emergency Medicine

## 2023-01-11 DIAGNOSIS — R0789 Other chest pain: Secondary | ICD-10-CM | POA: Diagnosis not present

## 2023-01-11 DIAGNOSIS — E119 Type 2 diabetes mellitus without complications: Secondary | ICD-10-CM | POA: Insufficient documentation

## 2023-01-11 DIAGNOSIS — C159 Malignant neoplasm of esophagus, unspecified: Secondary | ICD-10-CM | POA: Diagnosis not present

## 2023-01-11 DIAGNOSIS — I10 Essential (primary) hypertension: Secondary | ICD-10-CM | POA: Diagnosis not present

## 2023-01-11 DIAGNOSIS — R079 Chest pain, unspecified: Secondary | ICD-10-CM | POA: Diagnosis present

## 2023-01-11 LAB — BASIC METABOLIC PANEL
Anion gap: 10 (ref 5–15)
BUN: 15 mg/dL (ref 6–20)
CO2: 23 mmol/L (ref 22–32)
Calcium: 7.6 mg/dL — ABNORMAL LOW (ref 8.9–10.3)
Chloride: 99 mmol/L (ref 98–111)
Creatinine, Ser: 0.78 mg/dL (ref 0.61–1.24)
GFR, Estimated: >60 mL/min (ref >60)
Glucose, Bld: 204 mg/dL — ABNORMAL HIGH (ref 70–99)
Potassium: 3.6 mmol/L (ref 3.5–5.1)
Sodium: 132 mmol/L — ABNORMAL LOW (ref 135–145)

## 2023-01-11 LAB — CBC
HCT: 23.3 % — ABNORMAL LOW (ref 39.0–52.0)
Hemoglobin: 7.7 g/dL — ABNORMAL LOW (ref 13.0–17.0)
MCH: 24.9 pg — ABNORMAL LOW (ref 26.0–34.0)
MCHC: 33 g/dL (ref 30.0–36.0)
MCV: 75.4 fL — ABNORMAL LOW (ref 80.0–100.0)
Platelets: 316 10*3/uL (ref 150–400)
RBC: 3.09 MIL/uL — ABNORMAL LOW (ref 4.22–5.81)
RDW: 20.6 % — ABNORMAL HIGH (ref 11.5–15.5)
WBC: 6.5 10*3/uL (ref 4.0–10.5)
nRBC: 0 % (ref 0.0–0.2)

## 2023-01-11 LAB — HEPATIC FUNCTION PANEL
ALT: 9 U/L (ref 0–44)
AST: 12 U/L — ABNORMAL LOW (ref 15–41)
Albumin: 2.2 g/dL — ABNORMAL LOW (ref 3.5–5.0)
Alkaline Phosphatase: 67 U/L (ref 38–126)
Bilirubin, Direct: 0.1 mg/dL (ref 0.0–0.2)
Indirect Bilirubin: 0.3 mg/dL (ref 0.3–0.9)
Total Bilirubin: 0.4 mg/dL (ref 0.3–1.2)
Total Protein: 5.6 g/dL — ABNORMAL LOW (ref 6.5–8.1)

## 2023-01-11 LAB — LIPASE, BLOOD: Lipase: 22 U/L (ref 11–51)

## 2023-01-11 LAB — TROPONIN I (HIGH SENSITIVITY): Troponin I (High Sensitivity): 19 ng/L — ABNORMAL HIGH (ref <18)

## 2023-01-11 MED ORDER — MORPHINE SULFATE (PF) 4 MG/ML IV SOLN
4.0000 mg | Freq: Once | INTRAVENOUS | Status: AC
  Administered 2023-01-11: 4 mg via INTRAVENOUS
  Filled 2023-01-11: qty 1

## 2023-01-11 MED ORDER — LIDOCAINE VISCOUS HCL 2 % MT SOLN
15.0000 mL | Freq: Once | OROMUCOSAL | Status: AC
  Administered 2023-01-11: 15 mL via OROMUCOSAL
  Filled 2023-01-11: qty 15

## 2023-01-11 MED ORDER — MAALOX MAX 400-400-40 MG/5ML PO SUSP: 15.0000 mL | Freq: Four times a day (QID) | ORAL | 0 refills | Status: DC | PRN

## 2023-01-11 MED ORDER — ALUM & MAG HYDROXIDE-SIMETH 200-200-20 MG/5ML PO SUSP
30.0000 mL | Freq: Once | ORAL | Status: AC
  Administered 2023-01-11: 30 mL via ORAL
  Filled 2023-01-11: qty 30

## 2023-01-11 MED ORDER — ALUM & MAG HYDROXIDE-SIMETH 200-200-20 MG/5ML PO SUSP: 30.0000 mL | Freq: Once | ORAL | Status: DC

## 2023-01-11 MED ORDER — LIDOCAINE VISCOUS HCL 2 % MT SOLN: 15.0000 mL | OROMUCOSAL | 0 refills | Status: DC | PRN

## 2023-01-11 MED ORDER — SODIUM CHLORIDE 0.9 % IV BOLUS
1000.0000 mL | Freq: Once | INTRAVENOUS | Status: AC
  Administered 2023-01-11: 1000 mL via INTRAVENOUS

## 2023-01-11 MED ORDER — ONDANSETRON HCL 4 MG/2ML IJ SOLN
4.0000 mg | Freq: Once | INTRAMUSCULAR | Status: AC
  Administered 2023-01-11: 4 mg via INTRAVENOUS
  Filled 2023-01-11: qty 2

## 2023-01-11 NOTE — ED Triage Notes (Signed)
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 They called me and told me that they think that I had a heart attack and to come to the nearest hospital." Pt reports SOB, reports n/v, denies diarrhea, reports x 4 vomiting episodes in the last 24 hours, A&O x4

## 2023-01-11 NOTE — ED Provider Notes (Signed)
[name] EMERGENCY DEPARTMENT AT [facility] Provider Note   CSN: [id number] Arrival date & time: 01/11/23  2103     History  Chief Complaint  Patient presents with   Chest Pain    Maxwell Henderson is a 57 y.o. male.  Pt is a 57 yo male with pmhx significant for htn, anemia, dm, and stage 4 esophageal carcinoma.  Pt had a stent placed in March at [facility] by Dr. Ebony Henderson.  He has been having intermittent, severe pain in his chest from the stent.  Today, he had a chemo infusion at [facility].  He developed a lot of pain during the infusion and went to the ED at [facility] for further eval.  While there, he had labs drawn which showed a very mildly elevated troponin of 23.  He left after waiting for several hours to be seen.  He was called and told to come back to the hospital.  Unfortunately, he came here instead of back to [facility] where he has had everything done.  Pt still has pain in his chest which he said is the same pain that he's had intermittently with his stent.       Home Medications Prior to Admission medications   Medication Sig Start Date End Date Taking? Authorizing Provider  alum & mag hydroxide-simeth (MAALOX MAX) 400-400-40 MG/5ML suspension Take 15 mLs by mouth every 6 (six) hours as needed for indigestion. 01/11/23  Yes Jacalyn Lefevre, MD  lidocaine (XYLOCAINE) 2 % solution Use as directed 15 mLs in the mouth or throat as needed (esophageal pain). 01/11/23  Yes Jacalyn Lefevre, MD  acetaminophen (TYLENOL) 500 MG tablet Take 1,000 mg by mouth every 6 (six) hours as needed.    [provider]  dicyclomine (BENTYL) 10 MG capsule Take 10 mg by mouth 3 (three) times daily before meals. 08/23/21   [provider]  famotidine (PEPCID) 20 MG tablet Take 20 mg by mouth 2 (two) times daily. 05/22/19   [provider]  Lactulose 20 GM/30ML SOLN Take 30 mLs (20 g total) by mouth 2 (two) times a day. 02/17/19   Doreatha Massed, MD  methadone  (DOLOPHINE) 10 MG tablet Take by mouth See admin instructions. Take 2.5 tablets by mouth 3 times a day 07/15/21   [provider]  metoCLOPramide (REGLAN) 5 MG/5ML solution Take 10 mg by mouth 3 (three) times daily. 10/24/21   [provider]  NARCAN 4 MG/0.1ML LIQD nasal spray kit SMARTSIG:1 Spray(s) Both Nares Once PRN 09/20/21   [provider]  ondansetron (ZOFRAN) 8 MG tablet Take 8 mg by mouth 3 (three) times daily. 09/07/21   [provider]  oxycodone (ROXICODONE) 30 MG immediate release tablet Take 30 mg by mouth every 4 (four) hours as needed for pain. 07/15/21   [provider]  pantoprazole (PROTONIX) 40 MG tablet Take 1 tablet (40 mg total) by mouth 2 (two) times daily. 07/17/21 03/06/22  Sherryll Burger, Pratik D, DO  pregabalin (LYRICA) 50 MG capsule Take 50 mg by mouth 3 (three) times daily. 02/10/22   [provider]  sucralfate (CARAFATE) 1 g tablet Take 1 tablet (1 g total) by mouth 4 (four) times daily -  with meals and at bedtime. 07/17/21 11/19/21  Sherryll Burger, Pratik D, DO  traZODone (DESYREL) 50 MG tablet Take 1 tablet (50 mg total) by mouth at bedtime. 08/14/21   Catarina Hartshorn, MD      Allergies    Ketamine and related  Review of Systems   Review of Systems  Cardiovascular:  Positive for chest pain.  All other systems reviewed and are negative.   Physical Exam Updated Vital Signs BP 112/77 (BP Location: Right Arm)   Pulse 81   Temp 98 F (36.7 C) (Oral)   Resp 20   SpO2 100%  Physical Exam Vitals and nursing note reviewed.  Constitutional:      Appearance: He is ill-appearing.  HENT:     Head: Normocephalic and atraumatic.  Eyes:     Extraocular Movements: Extraocular movements intact.     Pupils: Pupils are equal, round, and reactive to light.  Cardiovascular:     Rate and Rhythm: Normal rate and regular rhythm.     Heart sounds: Normal heart sounds.  Pulmonary:     Effort: Pulmonary effort is normal.     Breath sounds: Normal  breath sounds.  Abdominal:     General: Bowel sounds are normal.     Palpations: Abdomen is soft.  Musculoskeletal:        General: Normal range of motion.     Cervical back: Normal range of motion and neck supple.  Skin:    General: Skin is warm.     Capillary Refill: Capillary refill takes less than 2 seconds.  Neurological:     General: No focal deficit present.     Mental Status: He is alert and oriented to person, place, and time.  Psychiatric:        Mood and Affect: Mood normal.        Behavior: Behavior normal.     ED Results / Procedures / Treatments   Labs (all labs ordered are listed, but only abnormal results are displayed) Labs Reviewed  BASIC METABOLIC PANEL - Abnormal; Notable for the following components:      Result Value   Sodium 132 (*)    Glucose, Bld 204 (*)    Calcium 7.6 (*)    All other components within normal limits  CBC - Abnormal; Notable for the following components:   RBC 3.09 (*)    Hemoglobin 7.7 (*)    HCT 23.3 (*)    MCV 75.4 (*)    MCH 24.9 (*)    RDW 20.6 (*)    All other components within normal limits  HEPATIC FUNCTION PANEL - Abnormal; Notable for the following components:   Total Protein 5.6 (*)    Albumin 2.2 (*)    AST 12 (*)    All other components within normal limits  TROPONIN I (HIGH SENSITIVITY) - Abnormal; Notable for the following components:   Troponin I (High Sensitivity) 19 (*)    All other components within normal limits  LIPASE, BLOOD    EKG EKG Interpretation  Date/Time:  Thursday Jan 11 2023 21:12:17 EDT Ventricular Rate:  89 PR Interval:  136 QRS Duration: 72 QT Interval:  390 QTC Calculation: 474 R Axis:   74 Text Interpretation: Normal sinus rhythm Nonspecific ST and T wave abnormality Abnormal ECG When compared with ECG of 19-Nov-2021 11:43, PREVIOUS ECG IS PRESENT  mild ST depression laterally which is new Confirmed by Jacalyn Lefevre [phone]) on 01/11/2023 9:40:23 PM  Radiology DG Chest Port 1  View  Result Date: 01/11/2023 CLINICAL DATA:  Chest pain EXAM: PORTABLE CHEST 1 VIEW COMPARISON:  None Available. FINDINGS: Lungs are clear. No pneumothorax or pleural effusion. Cardiac size within normal limits. Right internal jugular chest port tip seen at the superior cavoatrial junction. Esophageal stent is  partially visualized spanning the expected gastroesophageal junction. Pulmonary vascularity is normal. No acute bone abnormality. IMPRESSION: 1. No active disease. Esophageal stent in place. Electronically Signed   By: Helyn Numbers M.D.   On: 01/11/2023 21:55    Procedures Procedures    Medications Ordered in ED Medications  morphine (PF) 4 MG/ML injection 4 mg (4 mg Intravenous Given 01/11/23 2131)  ondansetron (ZOFRAN) injection 4 mg (4 mg Intravenous Given 01/11/23 2130)  sodium chloride 0.9 % bolus 1,000 mL (1,000 mLs Intravenous New Bag/Given 01/11/23 2129)  alum & mag hydroxide-simeth (MAALOX/MYLANTA) 200-200-20 MG/5ML suspension 30 mL (30 mLs Oral Given 01/11/23 2136)  lidocaine (XYLOCAINE) 2 % viscous mouth solution 15 mL (15 mLs Mouth/Throat Given 01/11/23 2136)    ED Course/ Medical Decision Making/ A&P                             Medical Decision Making Amount and/or Complexity of Data Reviewed Labs: ordered. Radiology: ordered.  Risk OTC drugs. Prescription drug management.   This patient presents to the ED for concern of cp, this involves an extensive number of treatment options, and is a complaint that carries with it a high risk of complications and morbidity.  The differential diagnosis includes cardiac, pulm, gi   Co morbidities that complicate the patient evaluation  htn, anemia, dm, and stage 4 esophageal carcinoma   Additional history obtained:  Additional history obtained from epic chart review External records from outside source obtained and reviewed including wife   Lab Tests:  I Ordered, and personally interpreted labs.  The pertinent  results include:  cbc with hgb 7.7 (chronic); bmp with glucose elevated at 204, trop 19, lip 22   Imaging Studies ordered:  I ordered imaging studies including cxr  I independently visualized and interpreted imaging which showed No active disease. Esophageal stent in place.  I agree with the radiologist interpretation   Cardiac Monitoring:  The patient was maintained on a cardiac monitor.  I personally viewed and interpreted the cardiac monitored which showed an underlying rhythm of: nsr   Medicines ordered and prescription drug management:  I ordered medication including morphine/zofran/gi cocktail  for sx  Reevaluation of the patient after these medicines showed that the patient improved I have reviewed the patients home medicines and have made adjustments as needed   Critical Interventions:  Pain control   Problem List / ED Course:  Chest pain is likely due to esophageal stent.  It has been bothering patient a lot.  Trop is 16 which is lower than it was several hours ago.  He is at the point of telling gi doc to remove and put in a g tube, but is not quite there yet.  He is offered admission, but wants to go home. His son is going into the [employer] and there is a  party for him tomorrow. He wants to be there for that.  He has a televisit with his GI doc tomorrow morning.     Reevaluation:  After the interventions noted above, I reevaluated the patient and found that they have :improved   Social Determinants of Health:  Lives at home   Dispostion:  After consideration of the diagnostic results and the patients response to treatment, I feel that the patent would benefit from discharge with outpatient f/u.          Final Clinical Impression(s) / ED Diagnoses Final diagnoses:  Atypical chest pain  Esophageal cancer, stage IV (HCC)    Rx / DC Orders ED Discharge Orders          Ordered    alum & mag hydroxide-simeth (MAALOX MAX) 400-400-40 MG/5ML suspension   Every 6 hours PRN        01/11/23 2228    lidocaine (XYLOCAINE) 2 % solution  As needed        01/11/23 2228              Jacalyn Lefevre, MD 01/11/23 2232

## 2023-02-19 ENCOUNTER — Other Ambulatory Visit: Payer: Self-pay

## 2023-02-19 ENCOUNTER — Emergency Department (HOSPITAL_COMMUNITY): Payer: Medicaid Other

## 2023-02-19 ENCOUNTER — Encounter (HOSPITAL_COMMUNITY): Payer: Self-pay

## 2023-02-19 ENCOUNTER — Emergency Department (HOSPITAL_COMMUNITY)
Admission: EM | Admit: 2023-02-19 | Discharge: 2023-02-20 | Disposition: A | Discharge status: Other Acute Inpatient Hospital | Payer: Medicaid Other | Attending: Emergency Medicine | Admitting: Emergency Medicine

## 2023-02-19 DIAGNOSIS — R110 Nausea: Secondary | ICD-10-CM | POA: Diagnosis not present

## 2023-02-19 DIAGNOSIS — R109 Unspecified abdominal pain: Secondary | ICD-10-CM | POA: Insufficient documentation

## 2023-02-19 DIAGNOSIS — K920 Hematemesis: Secondary | ICD-10-CM | POA: Insufficient documentation

## 2023-02-19 DIAGNOSIS — R112 Nausea with vomiting, unspecified: Secondary | ICD-10-CM

## 2023-02-19 DIAGNOSIS — Z8501 Personal history of malignant neoplasm of esophagus: Secondary | ICD-10-CM | POA: Diagnosis not present

## 2023-02-19 LAB — LIPASE, BLOOD: Lipase: 22 U/L (ref 11–51)

## 2023-02-19 LAB — BASIC METABOLIC PANEL
Anion gap: 9 (ref 5–15)
BUN: 11 mg/dL (ref 6–20)
CO2: 27 mmol/L (ref 22–32)
Calcium: 7.4 mg/dL — ABNORMAL LOW (ref 8.9–10.3)
Chloride: 95 mmol/L — ABNORMAL LOW (ref 98–111)
Creatinine, Ser: 0.74 mg/dL (ref 0.61–1.24)
GFR, Estimated: >60 mL/min (ref >60)
Glucose, Bld: 124 mg/dL — ABNORMAL HIGH (ref 70–99)
Potassium: 4 mmol/L (ref 3.5–5.1)
Sodium: 131 mmol/L — ABNORMAL LOW (ref 135–145)

## 2023-02-19 LAB — CBC WITH DIFFERENTIAL/PLATELET
Abs Immature Granulocytes: 0.06 10*3/uL (ref 0.00–0.07)
Basophils Absolute: 0 10*3/uL (ref 0.0–0.1)
Basophils Relative: 0 %
Eosinophils Absolute: 0 10*3/uL (ref 0.0–0.5)
Eosinophils Relative: 0 %
HCT: 28.8 % — ABNORMAL LOW (ref 39.0–52.0)
Hemoglobin: 9.1 g/dL — ABNORMAL LOW (ref 13.0–17.0)
Immature Granulocytes: 0 %
Lymphocytes Relative: 2 %
Lymphs Abs: 0.4 10*3/uL — ABNORMAL LOW (ref 0.7–4.0)
MCH: 24.5 pg — ABNORMAL LOW (ref 26.0–34.0)
MCHC: 31.6 g/dL (ref 30.0–36.0)
MCV: 77.4 fL — ABNORMAL LOW (ref 80.0–100.0)
Monocytes Absolute: 0.4 10*3/uL (ref 0.1–1.0)
Monocytes Relative: 2 %
Neutro Abs: 15.9 10*3/uL — ABNORMAL HIGH (ref 1.7–7.7)
Neutrophils Relative %: 96 %
Platelets: 370 10*3/uL (ref 150–400)
RBC: 3.72 MIL/uL — ABNORMAL LOW (ref 4.22–5.81)
RDW: 20.3 % — ABNORMAL HIGH (ref 11.5–15.5)
WBC: 16.8 10*3/uL — ABNORMAL HIGH (ref 4.0–10.5)
nRBC: 0 % (ref 0.0–0.2)

## 2023-02-19 LAB — HEPATIC FUNCTION PANEL
ALT: 9 U/L (ref 0–44)
AST: 13 U/L — ABNORMAL LOW (ref 15–41)
Albumin: 1.9 g/dL — ABNORMAL LOW (ref 3.5–5.0)
Alkaline Phosphatase: 81 U/L (ref 38–126)
Bilirubin, Direct: 0.1 mg/dL (ref 0.0–0.2)
Indirect Bilirubin: 0.6 mg/dL (ref 0.3–0.9)
Total Bilirubin: 0.7 mg/dL (ref 0.3–1.2)
Total Protein: 6.6 g/dL (ref 6.5–8.1)

## 2023-02-19 MED ORDER — METOCLOPRAMIDE HCL 5 MG/ML IJ SOLN
10.0000 mg | Freq: Four times a day (QID) | INTRAMUSCULAR | Status: DC | PRN
  Administered 2023-02-19: 10 mg via INTRAVENOUS
  Filled 2023-02-19: qty 2

## 2023-02-19 MED ORDER — PANTOPRAZOLE SODIUM 40 MG IV SOLR
40.0000 mg | Freq: Once | INTRAVENOUS | Status: AC
  Administered 2023-02-19: 40 mg via INTRAVENOUS
  Filled 2023-02-19: qty 10

## 2023-02-19 MED ORDER — SODIUM CHLORIDE 0.9 % IV SOLN: Freq: Once | INTRAVENOUS | Status: AC

## 2023-02-19 MED ORDER — METOCLOPRAMIDE HCL 5 MG/ML IJ SOLN
10.0000 mg | Freq: Once | INTRAMUSCULAR | Status: AC
  Administered 2023-02-19: 10 mg via INTRAVENOUS
  Filled 2023-02-19: qty 2

## 2023-02-19 MED ORDER — SODIUM CHLORIDE 0.9 % IV BOLUS
1000.0000 mL | Freq: Once | INTRAVENOUS | Status: AC
  Administered 2023-02-19: 1000 mL via INTRAVENOUS

## 2023-02-19 MED ORDER — HEPARIN SOD (PORK) LOCK FLUSH 100 UNIT/ML IV SOLN
500.0000 [IU] | Freq: Once | INTRAVENOUS | Status: DC
  Filled 2023-02-19: qty 5

## 2023-02-19 MED ORDER — HYDROMORPHONE HCL 1 MG/ML IJ SOLN
2.0000 mg | INTRAMUSCULAR | Status: DC | PRN
  Administered 2023-02-19 – 2023-02-20 (×6): 2 mg via INTRAVENOUS
  Filled 2023-02-19 (×6): qty 2

## 2023-02-19 MED ORDER — HYDROMORPHONE HCL 1 MG/ML IJ SOLN
1.0000 mg | Freq: Once | INTRAMUSCULAR | Status: AC
  Administered 2023-02-19: 1 mg via INTRAVENOUS
  Filled 2023-02-19: qty 1

## 2023-02-19 MED ORDER — HYDROMORPHONE HCL 1 MG/ML IJ SOLN
2.0000 mg | Freq: Once | INTRAMUSCULAR | Status: AC
  Administered 2023-02-19: 2 mg via INTRAVENOUS
  Filled 2023-02-19: qty 2

## 2023-02-19 MED ORDER — ONDANSETRON HCL 4 MG/2ML IJ SOLN
4.0000 mg | Freq: Once | INTRAMUSCULAR | Status: AC
  Administered 2023-02-19: 4 mg via INTRAVENOUS
  Filled 2023-02-19: qty 2

## 2023-02-19 MED ORDER — ONDANSETRON HCL 4 MG/2ML IJ SOLN
4.0000 mg | Freq: Four times a day (QID) | INTRAMUSCULAR | Status: DC | PRN
  Administered 2023-02-19 – 2023-02-20 (×3): 4 mg via INTRAVENOUS
  Filled 2023-02-19 (×3): qty 2

## 2023-02-19 NOTE — ED Notes (Signed)
ED Provider at bedside. 

## 2023-02-19 NOTE — ED Provider Notes (Signed)
[place] EMERGENCY DEPARTMENT AT [facility] Provider Note   CSN: [id number] Arrival date & time: 02/19/23  0245     History  Chief Complaint  Patient presents with   Hematemesis    Maxwell Henderson is a 57 y.o. male.  Patient presents to the emergency department for evaluation of nausea, vomiting, hematemesis, abdominal pain.  Patient with stage IV esophageal cancer, being treated at [facility].  He had a hospitalization 2 weeks ago for same.  He has been receiving blood transfusions for anemia secondary to bleeding from his esophageal cancer.  He is currently being transition to a new chemotherapy, has not started it yet.       Home Medications Prior to Admission medications   Medication Sig Start Date End Date Taking? Authorizing Provider  dexamethasone (DECADRON) 4 MG tablet Take 4 mg by mouth 2 (two) times daily. Take 2 tablets (8 mg total) in the morning and in the evening the day before and the day after chemotherapy 02/14/23  Yes [provider]  esomeprazole (NEXIUM) 20 MG packet Take 20 mg by mouth daily before breakfast. Take 20 mg by mouth 2 (two) times a day. 02/08/23  Yes [provider]  ferrous sulfate 300 (60 Fe) MG/5ML syrup Take 300 mg by mouth 3 (three) times a week.  Take 5 mL (300 mg total) by mouth 3 (three) times a week. 02/09/23  Yes [provider]  HYDROcodone-acetaminophen (HYCET) 7.5-325 mg/15 ml solution Take 15 mLs by mouth every 4 (four) hours as needed for moderate pain. Take 15 mL (7.5 mg of hydrocodone total) by mouth every 4 (four) hours as needed for moderate pain (4-6). 01/15/23  Yes [provider]  acetaminophen (TYLENOL) 500 MG tablet Take 1,000 mg by mouth every 6 (six) hours as needed.    [provider]  alum & mag hydroxide-simeth (MAALOX MAX) 400-400-40 MG/5ML suspension Take 15 mLs by mouth every 6 (six) hours as needed for indigestion. 01/11/23   Jacalyn Lefevre, MD  dicyclomine (BENTYL) 10 MG  capsule Take 10 mg by mouth 3 (three) times daily before meals. 08/23/21   [provider]  famotidine (PEPCID) 20 MG tablet Take 20 mg by mouth 2 (two) times daily. 05/22/19   [provider]  Lactulose 20 GM/30ML SOLN Take 30 mLs (20 g total) by mouth 2 (two) times a day. 02/17/19   Doreatha Massed, MD  lidocaine (XYLOCAINE) 2 % solution Use as directed 15 mLs in the mouth or throat as needed (esophageal pain). 01/11/23   Jacalyn Lefevre, MD  methadone (DOLOPHINE) 10 MG tablet Take by mouth See admin instructions. Take 2.5 tablets by mouth 3 times a day 07/15/21   [provider]  metoCLOPramide (REGLAN) 5 MG/5ML solution Take 10 mg by mouth 3 (three) times daily. 10/24/21   [provider]  NARCAN 4 MG/0.1ML LIQD nasal spray kit SMARTSIG:1 Spray(s) Both Nares Once PRN 09/20/21   [provider]  ondansetron (ZOFRAN) 8 MG tablet Take 8 mg by mouth 3 (three) times daily. 09/07/21   [provider]  oxycodone (ROXICODONE) 30 MG immediate release tablet Take 30 mg by mouth every 4 (four) hours as needed for pain. 07/15/21   [provider]  pantoprazole (PROTONIX) 40 MG tablet Take 1 tablet (40 mg total) by mouth 2 (two) times daily. 07/17/21 03/06/22  Sherryll Burger, Pratik D, DO  pregabalin (LYRICA) 50 MG capsule Take 50 mg by mouth 3 (three) times daily. 02/10/22   [provider]  sucralfate (CARAFATE) 1 g tablet Take 1 tablet (1 g total) by mouth 4 (four) times daily -  with meals and at bedtime. 07/17/21 11/19/21  Sherryll Burger, Pratik D, DO  traZODone (DESYREL) 50 MG tablet Take 1 tablet (50 mg total) by mouth at bedtime. 08/14/21   Catarina Hartshorn, MD      Allergies    Ketamine and related    Review of Systems   Review of Systems  Physical Exam Updated Vital Signs BP (!) 143/96 (BP Location: Right Arm)   Pulse 83   Temp 99.1 F (37.3 C) (Oral)   Resp 19   Ht 6\' 1"  (1.854 m)   Wt 63.5 kg   SpO2 96%   BMI 18.47 kg/m  Physical Exam Vitals and  nursing note reviewed.  Constitutional:      General: He is not in acute distress.    Appearance: He is well-developed.  HENT:     Head: Normocephalic and atraumatic.     Mouth/Throat:     Mouth: Mucous membranes are moist.  Eyes:     General: Vision grossly intact. Gaze aligned appropriately.     Extraocular Movements: Extraocular movements intact.     Conjunctiva/sclera: Conjunctivae normal.  Cardiovascular:     Rate and Rhythm: Normal rate and regular rhythm.     Pulses: Normal pulses.     Heart sounds: Normal heart sounds, S1 normal and S2 normal. No murmur heard.    No friction rub. No gallop.  Pulmonary:     Effort: Pulmonary effort is normal. No respiratory distress.     Breath sounds: Normal breath sounds.  Abdominal:     Palpations: Abdomen is soft.     Tenderness: There is abdominal tenderness in the epigastric area. There is no guarding or rebound.     Hernia: No hernia is present.  Musculoskeletal:        General: No swelling.     Cervical back: Full passive range of motion without pain, normal range of motion and neck supple. No pain with movement, spinous process tenderness or muscular tenderness. Normal range of motion.     Right lower leg: No edema.     Left lower leg: No edema.  Skin:    General: Skin is warm and dry.     Capillary Refill: Capillary refill takes less than 2 seconds.     Findings: No ecchymosis, erythema, lesion or wound.  Neurological:     Mental Status: He is alert and oriented to person, place, and time.     GCS: GCS eye subscore is 4. GCS verbal subscore is 5. GCS motor subscore is 6.     Cranial Nerves: Cranial nerves 2-12 are intact.     Sensory: Sensation is intact.     Motor: Motor function is intact. No weakness or abnormal muscle tone.     Coordination: Coordination is intact.  Psychiatric:        Mood and Affect: Mood normal.        Speech: Speech normal.        Behavior: Behavior normal.     ED Results / Procedures /  Treatments   Labs (all labs ordered are listed, but only abnormal results are displayed) Labs Reviewed  CBC WITH DIFFERENTIAL/PLATELET - Abnormal; Notable for the following components:      Result Value   WBC 16.8 (*)    RBC 3.72 (*)    Hemoglobin 9.1 (*)    HCT 28.8 (*)  MCV 77.4 (*)    MCH 24.5 (*)    RDW 20.3 (*)    Neutro Abs 15.9 (*)    Lymphs Abs 0.4 (*)    All other components within normal limits  BASIC METABOLIC PANEL - Abnormal; Notable for the following components:   Sodium 131 (*)    Chloride 95 (*)    Glucose, Bld 124 (*)    Calcium 7.4 (*)    All other components within normal limits  HEPATIC FUNCTION PANEL - Abnormal; Notable for the following components:   Albumin 1.9 (*)    AST 13 (*)    All other components within normal limits  LIPASE, BLOOD    EKG None  Radiology No results found.  Procedures Procedures    Medications Ordered in ED Medications  heparin lock flush 100 unit/mL (has no administration in time range)  sodium chloride 0.9 % bolus 1,000 mL (0 mLs Intravenous Stopped 02/19/23 0601)  ondansetron (ZOFRAN) injection 4 mg (4 mg Intravenous Given 02/19/23 0455)  HYDROmorphone (DILAUDID) injection 2 mg (2 mg Intravenous Given 02/19/23 0455)  HYDROmorphone (DILAUDID) injection 1 mg (1 mg Intravenous Given 02/19/23 0618)  metoCLOPramide (REGLAN) injection 10 mg (10 mg Intravenous Given 02/19/23 1610)    ED Course/ Medical Decision Making/ A&P                             Medical Decision Making Amount and/or Complexity of Data Reviewed Labs: ordered. Radiology: ordered.  Risk Prescription drug management.   Dents to the emergency department for evaluation of abdominal pain, hematemesis, intractable nausea and vomiting.  Patient with stage IV adenocarcinoma of the esophagus, treated at [facility].  Records seem to indicate that there is no more treatment available, although he and his wife state that he is to be started on a new chemotherapy  agent.  He was admitted to [facility] 2 weeks ago with the same complaints.  At that time palliative care made pain management changes to his meds and he was discharged when he became able to tolerate oral intake.  He has received multiple doses of Dilaudid, antiemetics.  His labs are stable and his vital signs are stable, I do not feel that there is any significant amount of hematemesis that requires treatment at this time.  Pain control has been an issue.  I discussed with the patient the possibility of discharge and having him contact his doctors for increased pain management but he asserts that he is unable to be discharged because of pain at this time.  He is still feeling nauseated.  Discussed with Dr. Governor Rooks, heme-onc at [facility].  There is currently a wait list but he will be placed on the wait list for transfer to [facility] for further management.  CRITICAL CARE Performed by: Gilda Crease   Total critical care time: 35 minutes  Critical care time was exclusive of separately billable procedures and treating other patients.  Critical care was necessary to treat or prevent imminent or life-threatening deterioration.  Critical care was time spent personally by me on the following activities: development of treatment plan with patient and/or surrogate as well as nursing, discussions with consultants, evaluation of patient's response to treatment, examination of patient, obtaining history from patient or surrogate, ordering and performing treatments and interventions, ordering and review of laboratory studies, ordering and review of radiographic studies, pulse oximetry and re-evaluation of patient's condition.  Final Clinical Impression(s) / ED Diagnoses Final diagnoses:  Nausea and vomiting, unspecified vomiting type  Hematemesis, unspecified whether nausea present    Rx / DC Orders ED Discharge Orders     None         Gilda Crease, MD 02/19/23  [phone]

## 2023-02-19 NOTE — ED Provider Notes (Signed)
Signout from Dr. Oletta Cohn.  57 year old male follows at [facility] for his stage IV esophageal cancer.  Complaining of nausea vomiting hematemesis pain.  Recent admission few weeks ago to [facility].  Pain medicine is not controlling his symptoms.  Lab work reasonably stable and no signs of active bleeding here.  [facility] has been contacted and he is on waiting list for transfer.  If patient were to feel better he is appropriate for discharge and outpatient treatment.  Otherwise waiting on bed.  Pain medication ordered. Physical Exam  BP (!) 143/96 (BP Location: Right Arm)   Pulse 83   Temp 99.1 F (37.3 C) (Oral)   Resp 19   Ht 6\' 1"  (1.854 m)   Wt 63.5 kg   SpO2 96%   BMI 18.47 kg/m   Physical Exam  Procedures  Procedures  ED Course / MDM    Medical Decision Making Amount and/or Complexity of Data Reviewed Labs: ordered. Radiology: ordered.  Risk Prescription drug management.   Patient has had no issues during my time in the department.  I have updated oncoming attending Dr. Hyacinth Meeker on status of awaiting bed at [facility].       Terrilee Files, MD 02/19/23 1759

## 2023-02-19 NOTE — ED Triage Notes (Signed)
Pt arrived via POV from home c/o hematemesis. Pt working through treatment at [facility] for Esophageal cancer.

## 2023-02-19 NOTE — ED Notes (Signed)
Pt going to C912 Nurse report [phone] Accepting MD M. Porosnicu

## 2023-02-19 NOTE — ED Notes (Addendum)
Pt is resting at this time, family is at bedside.

## 2023-02-19 NOTE — ED Notes (Signed)
Pt is sleeping at this time.

## 2023-02-19 NOTE — ED Notes (Signed)
Pt states that he would like to be placed on 2L [place], wears this in the hospital sometimes per patient. SpO2 93% after dilaudid (see MAR). 2L [place] provided, improve to 96%.

## 2023-02-20 NOTE — ED Provider Notes (Signed)
Patient resting comfortably.  He is in no acute distress.  Vitals are appropriate. He will be transferred to [facility]   Zadie Rhine, MD 02/20/23 0222

## 2023-04-16 NOTE — Progress Notes (Signed)
[facility] [address], [place] [address]   Clinic Day:  04/16/2023  Referring physician: Health, Matthew Folks*  Patient Care Team: Health, [facility] as PCP - General Rourk, Gerrit Friends, MD as Consulting Physician (Gastroenterology)   ASSESSMENT & PLAN:   Assessment: ***  Plan: ***  No orders of the defined types were placed in this encounter.     Maxwell Henderson,acting as a Neurosurgeon for Maxwell Massed, MD.,have documented all relevant documentation on the behalf of Maxwell Massed, MD,as directed by  Maxwell Massed, MD while in the presence of Maxwell Massed, MD.   ***  Maxwell Henderson   9/2/202410:18 PM  CHIEF COMPLAINT/PURPOSE OF CONSULT:   Diagnosis: Adenocarcinoma of esophagus, stage 4   Cancer Staging  Adenocarcinoma of esophagus, stage 4 (HCC) Staging form: Esophagus - Adenocarcinoma, AJCC 8th Edition - Clinical stage from 02/11/2017: Stage IVA (cTX, cN3, cM0) - Signed by Ellouise Newer, PA-C on 02/11/2017    Prior Therapy: ***  Current Therapy:  ***   HISTORY OF PRESENT ILLNESS:   Oncology History  Adenocarcinoma of esophagus, stage 4 (HCC)  01/16/2017 Initial Diagnosis   Primary esophageal adenocarcinoma (HCC)   01/16/2017 Procedure   EGD by Dr. Jonette Eva. Partially obstructing, likely malignant esophageal tumor was found in the distal esophagus. Biopsied with surgical path demonstrating Adenocarcinoma. Injected. Treated with argon plasma coagulation (APC) #3. 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 Biopsied with surgical path demonstrating Adenocarcinoma.   01/19/2017 PET scan   NECK Two FDG avid nodes are seen in the base of the neck on the right. The first is seen on CT image 35, just posterior to the right thyroid lobe and the other is seen on CT image 41 measuring 16 mm in short axis with a maximum SUV of 8.6.   CHEST   The patient's known malignancy  involving the distal esophagus and cardia of the stomach is FDG avid with a maximum SUV of 10.4. No definitive metastatic nodes within the chest.   ABDOMEN/PELVIS   FDG avid metastatic adenopathy is seen in the gastrohepatic ligament, paraceliac nodes, posterior to the IVC on image 111, and to the left of the SMA on image 112. The most inferior node is seen in the aortocaval region on image 117. The representative node to the left of the SMA was measured on series 4, image 111 measuring 2.5 by 2.1 cm with a maximum SUV of 7. No other FDG avid disease in the abdomen or pelvis. Cholelithiasis is identified.   02/02/2017 Procedure   US guided bx of right supraclavicular LN by IR.   02/05/2017 Pathology Results   Lymph node, needle/core biopsy, Right Supraclavicular - METASTATIC POORLY DIFFERENTIATED CARCINOMA.   02/10/2017 Pathology Results   HER2 - NEGATIVE   02/13/2017 Procedure   Port placed by IR   02/15/2017 - 04/25/2017 Chemotherapy   FOLFOX x 6 cycles    05/02/2017 Imaging   CT C/A/P: IMPRESSION: 1. Nodular thickening and hyperenhancement in the distal esophagus and gastric cardia could reflect residual tumor. 2. No significant change in size of previously demonstrated hypermetabolic upper abdominal lymph nodes. No progressive adenopathy identified. No residual enlarged supraclavicular nodes are seen. 3. No evidence of progressive metastatic disease. No worrisome hepatic findings. 4. Tiny right upper lobe pulmonary nodule, not clearly seen on low dose previous PET-CT. Attention on follow-up recommended. 5. Cholelithiasis.   12/02/2018 - 03/05/2019 Chemotherapy   Patient is on Treatment  Plan : COLORECTAL FOLFIRI q14d         Maxwell Henderson is a 57 y.o. male presenting to clinic today for evaluation of Adenocarcinoma of esophagus at the request of Beatriz Stallion., MD.  On 01/16/2017 an EGD found a partially obstructing tumor in the distal esophagus and a biopsy  consistent with adenocarcinoma. On 02/02/2017 a biopsy of the right supraclavicular lymph node found metastatic poorly differentiated carcinoma that was HER2 negative, after a PET on 03/19/2017 found avid nodes there. Patient was then put on FOLFOX from 02/15/2017 to 04/25/2017 for 6 cycles. He underwent celiac nerve block on 05/15/2019 due to metastasis of cancer causing pain presenting as abnormal soft tissue thickening of the distal esophagus extending across the esophagogastric junction into the proximal stomach and soft tissue extension of the mass into the lesser sac, suspicious for locally invasive disease, as well as to the enlarged and possible necrotic portacaval and para-aortic lymph nodes. He received palliative radiation on 10/02/19 after an EGD on 09/26/19 found significant progression of disease with GI bleed. Patient started Cycle 1 of Pembrolizumab on 11/20/19 and completed 21 cycles before progression. He also had radiation treatments beginning 09/05/21, 10 fractions. Esophageal adenocarcinoma remained stabled until 07/16/21 when an EGD noted an extremely large, but mildly oozing and friable gastric ulcer extending from the esophageal mass. CT C/A/P from 12/06/21 found progression of the disease with local extension from the stomach across the lesser sac into the body of the stomach and associated dilatation of the pancreatic duct downstream. He was reinitiated with FOLFOX with continuation of pembro q6 weeks.  He underwent multiple EGD's with dilation and had a biopsy of the gastric esophagus ulcer on 07/10/22 that revealed invasive adenocarcinoma, poorly differentiated and HER2 negative. He was started on FOLFIRI on 06/12/22 with progression in 02/2023. 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 He was admitted with esophageal perforation after progression of disease in 02/2023. During that admission he had a stent placed and he also had a G-tube placed at that time.   Of note, Paclitaxel was discontinued on 09/21/2017 secondary to neuropathy.   Today, he states that he is doing well overall. His appetite level is at ***%. His energy level is at ***%.  PAST MEDICAL HISTORY:   Past Medical History: Past Medical History:  Diagnosis Date   Diabetes mellitus without complication (HCC)    Hypertension    Iron deficiency anemia due to chronic blood loss 02/28/2017   Primary esophageal adenocarcinoma (HCC) 01/29/2017    Surgical History: Past Surgical History:  Procedure Laterality Date   ESOPHAGOGASTRODUODENOSCOPY  07/29/2021   focal stenosis at the GE junction s/p dilation. Malignant-appearing mass in proximal stomach just below GE junction.   ESOPHAGOGASTRODUODENOSCOPY (EGD) WITH PROPOFOL N/A 01/16/2017   Procedure: ESOPHAGOGASTRODUODENOSCOPY (EGD) WITH PROPOFOL;  Surgeon: West Bali, MD;  [facility];  Service: Endoscopy;  Laterality: N/A;  730    ESOPHAGOGASTRODUODENOSCOPY (EGD) WITH PROPOFOL N/A 11/01/2020   Procedure: ESOPHAGOGASTRODUODENOSCOPY (EGD) WITH PROPOFOL;  Surgeon: Corbin Ade, MD;  [facility];  Service: Endoscopy;  Laterality: N/A;   ESOPHAGOGASTRODUODENOSCOPY (EGD) WITH PROPOFOL N/A 07/17/2021   malignant esophagea tumor at GE junction, oozing gastric ulcer felt to be malignant.   ESOPHAGOGASTRODUODENOSCOPY (EGD) WITH PROPOFOL N/A 11/19/2021   Procedure: ESOPHAGOGASTRODUODENOSCOPY (  EGD) WITH PROPOFOL;  Surgeon: Lanelle Bal, DO;  [facility];  Service: Endoscopy;  Laterality: N/A;   ESOPHAGOGASTRODUODENOSCOPY (EGD) WITH PROPOFOL N/A 02/10/2022   Procedure: ESOPHAGOGASTRODUODENOSCOPY (EGD) WITH PROPOFOL;  Surgeon: Lanelle Bal, DO;  [facility];  Service: Endoscopy;   Laterality: N/A;   ESOPHAGOGASTRODUODENOSCOPY (EGD) WITH PROPOFOL N/A 06/05/2022   Procedure: ESOPHAGOGASTRODUODENOSCOPY (EGD) WITH PROPOFOL;  Surgeon: Lanelle Bal, DO;  [facility];  Service: Endoscopy;  Laterality: N/A;   ESOPHAGOGASTRODUODENOSCOPY (EGD) WITH PROPOFOL N/A 10/02/2022   Procedure: ESOPHAGOGASTRODUODENOSCOPY (EGD) WITH PROPOFOL;  Surgeon: Dolores Frame, MD;  [facility];  Service: Gastroenterology;  Laterality: N/A;   IR FLUORO GUIDE PORT INSERTION RIGHT  02/13/2017   IR US GUIDE VASC ACCESS RIGHT  02/13/2017   lipoma removal     PORTA CATH INSERTION Right 02/13/2017   SAVORY DILATION N/A 01/16/2017   Procedure: SAVORY DILATION;  Surgeon: West Bali, MD;  [facility];  Service: Endoscopy;  Laterality: N/A;    Social History: Social History   Socioeconomic History   Marital status: Married    Spouse name: Not on file   Number of children: Not on file   Years of education: Not on file   Highest education level: Not on file  Occupational History   Not on file  Tobacco Use   Smoking status: Former    Current packs/day: 0.00    Types: Cigarettes    Quit date: 08/14/2016    Years since quitting: 6.6   Smokeless tobacco: Never  Vaping Use   Vaping status: Never Used  Substance and Sexual Activity   Alcohol use: No    Comment: hx heavy alcohol, hasn't drank in 28 yrs   Drug use: Not Currently    Frequency: 2.0 times per week    Types: Marijuana   Sexual activity: Yes  Other Topics Concern   Not on file  Social History Narrative   WORKING [employer].   Social Determinants of Health   Financial Resource Strain: Unknown (05/03/2019)   Overall Financial Resource Strain (CARDIA)    Difficulty of Paying Living Expenses: Patient declined  Food Insecurity: Low Risk  (02/03/2023)   [facility]   Hunger Vital Sign    Worried About Running Out of Food in the Last  Year: Never true    Ran Out of Food in the Last Year: Never true  Transportation Needs: No Transportation Needs (02/03/2023)   [facility]   Transportation    In the past 12 months, has lack of reliable transportation kept you from medical appointments, meetings, work or from getting things needed for daily living? : No  Physical Activity: Unknown (05/03/2019)   Exercise Vital Sign    Days of Exercise per Week: Patient declined    Minutes of Exercise per Session: Patient declined  Stress: Unknown (05/03/2019)   [employer] of Occupational Health - Occupational Stress Questionnaire    Feeling of Stress : Patient declined  Social Connections: Unknown (12/27/2021)   [facility]   Social Network    Social Network: Not on file  Intimate Partner Violence: Unknown (11/17/2021)   Received from [facility], [facility]   HITS    Physically Hurt: Not on file    Insult or Talk Down To: Not on file    Threaten Physical Harm: Not on file  Scream or Curse: Not on file    Family History: Family History  Problem Relation Age of Onset   Prostate cancer Father    Colon cancer Neg Hx    Colon polyps Neg Hx     Current Medications:  Current Outpatient Medications:    acetaminophen (TYLENOL) 500 MG tablet, Take 1,000 mg by mouth every 6 (six) hours as needed., Disp: , Rfl:    alum & mag hydroxide-simeth (MAALOX MAX) 400-400-40 MG/5ML suspension, Take 15 mLs by mouth every 6 (six) hours as needed for indigestion., Disp: 355 mL, Rfl: 0   dexamethasone (DECADRON) 4 MG tablet, Take 4 mg by mouth 2 (two) times daily. Take 2 tablets (8 mg total) in the morning and in the evening the day before and the day after chemotherapy, Disp: , Rfl:    dicyclomine (BENTYL) 10 MG capsule, Take 10 mg by mouth 3 (three) times daily before meals., Disp: , Rfl:    esomeprazole (NEXIUM) 20 MG packet, Take 20 mg by mouth daily before breakfast. Take 20  mg by mouth 2 (two) times a day., Disp: , Rfl:    famotidine (PEPCID) 20 MG tablet, Take 20 mg by mouth 2 (two) times daily., Disp: , Rfl:    ferrous sulfate 300 (60 Fe) MG/5ML syrup, Take 300 mg by mouth 3 (three) times a week.  Take 5 mL (300 mg total) by mouth 3 (three) times a week., Disp: , Rfl:    HYDROcodone-acetaminophen (HYCET) 7.5-325 mg/15 ml solution, Take 15 mLs by mouth every 4 (four) hours as needed for moderate pain. Take 15 mL (7.5 mg of hydrocodone total) by mouth every 4 (four) hours as needed for moderate pain (4-6)., Disp: , Rfl:    Lactulose 20 GM/30ML SOLN, Take 30 mLs (20 g total) by mouth 2 (two) times a day., Disp: 450 mL, Rfl: 1   lidocaine (XYLOCAINE) 2 % solution, Use as directed 15 mLs in the mouth or throat as needed (esophageal pain)., Disp: 100 mL, Rfl: 0   methadone (DOLOPHINE) 10 MG tablet, Take by mouth See admin instructions. Take 2.5 tablets by mouth 3 times a day, Disp: , Rfl:    metoCLOPramide (REGLAN) 5 MG/5ML solution, Take 10 mg by mouth 3 (three) times daily., Disp: , Rfl:    NARCAN 4 MG/0.1ML LIQD nasal spray kit, SMARTSIG:1 Spray(s) Both Nares Once PRN, Disp: , Rfl:    ondansetron (ZOFRAN) 8 MG tablet, Take 8 mg by mouth 3 (three) times daily., Disp: , Rfl:    oxycodone (ROXICODONE) 30 MG immediate release tablet, Take 30 mg by mouth every 4 (four) hours as needed for pain., Disp: , Rfl:    pantoprazole (PROTONIX) 40 MG tablet, Take 1 tablet (40 mg total) by mouth 2 (two) times daily., Disp: 180 tablet, Rfl: 2   pregabalin (LYRICA) 50 MG capsule, Take 50 mg by mouth 3 (three) times daily., Disp: , Rfl:    sucralfate (CARAFATE) 1 g tablet, Take 1 tablet (1 g total) by mouth 4 (four) times daily -  with meals and at bedtime., Disp: 120 tablet, Rfl: 0   traZODone (DESYREL) 50 MG tablet, Take 1 tablet (50 mg total) by mouth at bedtime., Disp: , Rfl:    Allergies: Allergies  Allergen Reactions   Ketamine And Related Other (See Comments)    intolerance     REVIEW OF SYSTEMS:   Review of Systems  Constitutional:  Negative for chills, fatigue and fever.  HENT:   Negative for lump/mass,  mouth sores, nosebleeds, sore throat and trouble swallowing.   Eyes:  Negative for eye problems.  Respiratory:  Negative for cough and shortness of breath.   Cardiovascular:  Negative for chest pain, leg swelling and palpitations.  Gastrointestinal:  Negative for abdominal pain, constipation, diarrhea, nausea and vomiting.  Genitourinary:  Negative for bladder incontinence, difficulty urinating, dysuria, frequency, hematuria and nocturia.   Musculoskeletal:  Negative for arthralgias, back pain, flank pain, myalgias and neck pain.  Skin:  Negative for itching and rash.  Neurological:  Negative for dizziness, headaches and numbness.  Hematological:  Does not bruise/bleed easily.  Psychiatric/Behavioral:  Negative for depression, sleep disturbance and suicidal ideas. The patient is not nervous/anxious.   All other systems reviewed and are negative.    VITALS:   There were no vitals taken for this visit.  Wt Readings from Last 3 Encounters:  02/19/23 140 lb (63.5 kg)  10/02/22 173 lb (78.5 kg)  02/10/22 173 lb (78.5 kg)    There is no height or weight on file to calculate BMI.  Performance status (ECOG): {CHL ONC [id number]  PHYSICAL EXAM:   Physical Exam Vitals and nursing note reviewed. Exam conducted with a chaperone present.  Constitutional:      Appearance: Normal appearance.  Cardiovascular:     Rate and Rhythm: Normal rate and regular rhythm.     Pulses: Normal pulses.     Heart sounds: Normal heart sounds.  Pulmonary:     Effort: Pulmonary effort is normal.     Breath sounds: Normal breath sounds.  Abdominal:     Palpations: Abdomen is soft. There is no hepatomegaly, splenomegaly or mass.     Tenderness: There is no abdominal tenderness.  Musculoskeletal:     Right lower leg: No edema.     Left lower leg: No edema.   Lymphadenopathy:     Cervical: No cervical adenopathy.     Right cervical: No superficial, deep or posterior cervical adenopathy.    Left cervical: No superficial, deep or posterior cervical adenopathy.     Upper Body:     Right upper body: No supraclavicular or axillary adenopathy.     Left upper body: No supraclavicular or axillary adenopathy.  Neurological:     General: No focal deficit present.     Mental Status: He is alert and oriented to person, place, and time.  Psychiatric:        Mood and Affect: Mood normal.        Behavior: Behavior normal.     LABS:      Latest Ref Rng & Units 02/19/2023    4:09 AM 01/11/2023    9:25 PM 10/02/2022    8:39 PM  CBC  WBC 4.0 - 10.5 K/uL 16.8  6.5  4.8   Hemoglobin 13.0 - 17.0 g/dL 9.1  7.7  7.3   Hematocrit 39.0 - 52.0 % 28.8  23.3  22.9   Platelets 150 - 400 K/uL 370  316  255       Latest Ref Rng & Units 02/19/2023    4:09 AM 01/11/2023    9:25 PM 01/11/2023    9:23 PM  CMP  Glucose 70 - 99 mg/dL 308  657    BUN 6 - 20 mg/dL 11  15    Creatinine 8.46 - 1.24 mg/dL 9.62  9.52    Sodium 841 - 145 mmol/L 131  132    Potassium 3.5 - 5.1 mmol/L 4.0  3.6    Chloride  98 - 111 mmol/L 95  99    CO2 22 - 32 mmol/L 27  23    Calcium 8.9 - 10.3 mg/dL 7.4  7.6    Total Protein 6.5 - 8.1 g/dL 6.6   5.6   Total Bilirubin 0.3 - 1.2 mg/dL 0.7   0.4   Alkaline Phos 38 - 126 U/L 81   67   AST 15 - 41 U/L 13   12   ALT 0 - 44 U/L 9   9      No results found for: "CEA1", "CEA" / No results found for: "CEA1", "CEA" No results found for: "PSA1" No results found for: "ZOX096" No results found for: "CAN125"  No results found for: "TOTALPROTELP", "ALBUMINELP", "A1GS", "A2GS", "BETS", "BETA2SER", "GAMS", "MSPIKE", "SPEI" Lab Results  Component Value Date   TIBC 349 04/11/2017   TIBC 444 02/28/2017   FERRITIN 63 12/03/2017   FERRITIN 90 04/11/2017   FERRITIN 9 (L) 02/28/2017   IRONPCTSAT 17 (L) 04/11/2017   IRONPCTSAT 9 (L) 02/28/2017   Lab  Results  Component Value Date   LDH 94 (L) 06/28/2018   LDH 123 06/14/2018   LDH 132 12/21/2017     STUDIES:   No results found.

## 2023-04-17 ENCOUNTER — Inpatient Hospital Stay: Payer: Medicaid Other

## 2023-04-17 ENCOUNTER — Inpatient Hospital Stay: Payer: Medicaid Other | Attending: Hematology | Admitting: Hematology

## 2023-04-17 ENCOUNTER — Encounter: Payer: Self-pay | Admitting: Hematology

## 2023-04-17 ENCOUNTER — Telehealth: Payer: Self-pay

## 2023-04-17 VITALS — BP 106/79 | HR 84 | Temp 97.6°F | Resp 16 | Wt 125.0 lb

## 2023-04-17 DIAGNOSIS — Z8042 Family history of malignant neoplasm of prostate: Secondary | ICD-10-CM | POA: Insufficient documentation

## 2023-04-17 DIAGNOSIS — C19 Malignant neoplasm of rectosigmoid junction: Secondary | ICD-10-CM | POA: Diagnosis present

## 2023-04-17 DIAGNOSIS — Z79899 Other long term (current) drug therapy: Secondary | ICD-10-CM | POA: Diagnosis not present

## 2023-04-17 DIAGNOSIS — Z5111 Encounter for antineoplastic chemotherapy: Secondary | ICD-10-CM | POA: Insufficient documentation

## 2023-04-17 DIAGNOSIS — C159 Malignant neoplasm of esophagus, unspecified: Secondary | ICD-10-CM | POA: Diagnosis not present

## 2023-04-17 DIAGNOSIS — Z87891 Personal history of nicotine dependence: Secondary | ICD-10-CM | POA: Diagnosis not present

## 2023-04-17 DIAGNOSIS — G629 Polyneuropathy, unspecified: Secondary | ICD-10-CM | POA: Insufficient documentation

## 2023-04-17 DIAGNOSIS — R634 Abnormal weight loss: Secondary | ICD-10-CM | POA: Insufficient documentation

## 2023-04-17 DIAGNOSIS — Z23 Encounter for immunization: Secondary | ICD-10-CM | POA: Diagnosis not present

## 2023-04-17 DIAGNOSIS — R1013 Epigastric pain: Secondary | ICD-10-CM | POA: Insufficient documentation

## 2023-04-17 DIAGNOSIS — C770 Secondary and unspecified malignant neoplasm of lymph nodes of head, face and neck: Secondary | ICD-10-CM | POA: Diagnosis not present

## 2023-04-17 DIAGNOSIS — D649 Anemia, unspecified: Secondary | ICD-10-CM

## 2023-04-17 LAB — COMPREHENSIVE METABOLIC PANEL
ALT: 15 U/L (ref 0–44)
AST: 16 U/L (ref 15–41)
Albumin: 1.6 g/dL — ABNORMAL LOW (ref 3.5–5.0)
Alkaline Phosphatase: 71 U/L (ref 38–126)
Anion gap: 6 (ref 5–15)
BUN: 12 mg/dL (ref 6–20)
CO2: 30 mmol/L (ref 22–32)
Calcium: 7.5 mg/dL — ABNORMAL LOW (ref 8.9–10.3)
Chloride: 96 mmol/L — ABNORMAL LOW (ref 98–111)
Creatinine, Ser: 0.58 mg/dL — ABNORMAL LOW (ref 0.61–1.24)
GFR, Estimated: >60 mL/min (ref >60)
Glucose, Bld: 103 mg/dL — ABNORMAL HIGH (ref 70–99)
Potassium: 3.8 mmol/L (ref 3.5–5.1)
Sodium: 132 mmol/L — ABNORMAL LOW (ref 135–145)
Total Bilirubin: 0.5 mg/dL (ref 0.3–1.2)
Total Protein: 5.7 g/dL — ABNORMAL LOW (ref 6.5–8.1)

## 2023-04-17 LAB — MAGNESIUM: Magnesium: 1.9 mg/dL (ref 1.7–2.4)

## 2023-04-17 LAB — SAMPLE TO BLOOD BANK

## 2023-04-17 LAB — CBC WITH DIFFERENTIAL/PLATELET
Abs Immature Granulocytes: 0.04 10*3/uL (ref 0.00–0.07)
Basophils Absolute: 0 10*3/uL (ref 0.0–0.1)
Basophils Relative: 0 %
Eosinophils Absolute: 0 10*3/uL (ref 0.0–0.5)
Eosinophils Relative: 0 %
HCT: 25.8 % — ABNORMAL LOW (ref 39.0–52.0)
Hemoglobin: 8.4 g/dL — ABNORMAL LOW (ref 13.0–17.0)
Immature Granulocytes: 0 %
Lymphocytes Relative: 5 %
Lymphs Abs: 0.5 10*3/uL — ABNORMAL LOW (ref 0.7–4.0)
MCH: 27.5 pg (ref 26.0–34.0)
MCHC: 32.6 g/dL (ref 30.0–36.0)
MCV: 84.3 fL (ref 80.0–100.0)
Monocytes Absolute: 0.5 10*3/uL (ref 0.1–1.0)
Monocytes Relative: 5 %
Neutro Abs: 9 10*3/uL — ABNORMAL HIGH (ref 1.7–7.7)
Neutrophils Relative %: 90 %
Platelets: 282 10*3/uL (ref 150–400)
RBC: 3.06 MIL/uL — ABNORMAL LOW (ref 4.22–5.81)
RDW: 15.9 % — ABNORMAL HIGH (ref 11.5–15.5)
WBC: 10 10*3/uL (ref 4.0–10.5)
nRBC: 0 % (ref 0.0–0.2)

## 2023-04-17 LAB — RETICULOCYTES
Immature Retic Fract: 15.4 % (ref 2.3–15.9)
RBC.: 3.01 MIL/uL — ABNORMAL LOW (ref 4.22–5.81)
Retic Count, Absolute: 43 10*3/uL (ref 19.0–186.0)
Retic Ct Pct: 1.4 % (ref 0.4–3.1)

## 2023-04-17 MED ORDER — SODIUM CHLORIDE 0.9% FLUSH
10.0000 mL | INTRAVENOUS | Status: DC | PRN
  Administered 2023-04-17: 10 mL via INTRAVENOUS

## 2023-04-17 MED ORDER — HEPARIN SOD (PORK) LOCK FLUSH 100 UNIT/ML IV SOLN
500.0000 [IU] | Freq: Once | INTRAVENOUS | Status: AC
  Administered 2023-04-17: 500 [IU] via INTRAVENOUS

## 2023-04-17 NOTE — Progress Notes (Signed)
Maxwell Henderson presented for Portacath access and flush.  Portacath located right chest wall accessed with  H 20 needle.  Good blood return present. Portacath flushed with 20ml NS and 500U/58ml Heparin and needle removed intact. No complaints at this time. Discharged from clinic by wheel chair in stable condition. Alert and oriented x 3. F/U with [facility] as scheduled.

## 2023-04-17 NOTE — Telephone Encounter (Signed)
Called and left message for patient and his wife that his hemoglobin is 8.4 today. No need for blood transfusion per MD.

## 2023-04-17 NOTE — Patient Instructions (Addendum)
[facility]  Discharge Instructions  You were seen and examined today by Dr. Ellin Saba. Dr. Ellin Saba is a medical oncologist, meaning that he specializes in the treatment of cancer diagnoses. Dr. Ellin Saba discussed your past medical history, family history of cancers, and the events that led to you being here today.  You were referred to Dr. Ellin Saba from [facility] to receive blood transfusions close to home.  Dr. Ellin Saba has recommended we do lab work today. He will also reach out to Dr. Kathlene November to see if there are any treatments you received in the past that may be recycled to treat the cancer. He also recommends that you meet with our dietician since you are losing weight. At this weight, and if you continue to lose weight, any treatment with chemotherapy is not an option as you would not be able to tolerate.   Follow-up as scheduled.  Thank you for choosing [facility] to provide your oncology and hematology care.   To afford each patient quality time with our provider, please arrive at least 15 minutes before your scheduled appointment time. You may need to reschedule your appointment if you arrive late (10 or more minutes). Arriving late affects you and other patients whose appointments are after yours.  Also, if you miss three or more appointments without notifying the office, you may be dismissed from the clinic at the provider's discretion.    Again, thank you for choosing [facility].  Our hope is that these requests will decrease the amount of time that you wait before being seen by our physicians.   If you have a lab appointment with the Cancer Center - please note that after April 8th, all labs will be drawn in the cancer center.  You do not have to check in or register with the main entrance as you have in the past but will complete your check-in at the cancer center.             _____________________________________________________________  Should you have questions after your visit to [facility], please contact our office at [phone] and follow the prompts.  Our office hours are 8:00 a.m. to 4:30 p.m. Monday - Thursday and 8:00 a.m. to 2:30 p.m. Friday.  Please note that voicemails left after 4:00 p.m. may not be returned until the following business day.  We are closed weekends and all major holidays.  You do have access to a nurse 24-7, just call the main number to the clinic [phone] and do not press any options, hold on the line and a nurse will answer the phone.    For prescription refill requests, have your pharmacy contact our office and allow 72 hours.    Masks are no longer required in the cancer centers. If you would like for your care team to wear a mask while they are taking care of you, please let them know. You may have one support person who is at least 56 years old accompany you for your appointments.

## 2023-04-19 ENCOUNTER — Inpatient Hospital Stay: Payer: Medicaid Other | Admitting: Dietician

## 2023-04-19 ENCOUNTER — Telehealth: Payer: Self-pay | Admitting: Dietician

## 2023-04-19 NOTE — Telephone Encounter (Signed)
Called patient for scheduled nutrition assessment. Pt did not answer. Left VM with request for return call. Contact information provided.

## 2023-04-24 ENCOUNTER — Inpatient Hospital Stay: Payer: Medicaid Other

## 2023-04-24 ENCOUNTER — Other Ambulatory Visit: Payer: Self-pay

## 2023-04-24 DIAGNOSIS — Z5111 Encounter for antineoplastic chemotherapy: Secondary | ICD-10-CM | POA: Diagnosis not present

## 2023-04-24 DIAGNOSIS — C159 Malignant neoplasm of esophagus, unspecified: Secondary | ICD-10-CM

## 2023-04-24 DIAGNOSIS — D649 Anemia, unspecified: Secondary | ICD-10-CM

## 2023-04-24 LAB — CBC
HCT: 18.2 % — ABNORMAL LOW (ref 39.0–52.0)
Hemoglobin: 5.8 g/dL — CL (ref 13.0–17.0)
MCH: 26.6 pg (ref 26.0–34.0)
MCHC: 31.9 g/dL (ref 30.0–36.0)
MCV: 83.5 fL (ref 80.0–100.0)
Platelets: 280 K/uL (ref 150–400)
RBC: 2.18 MIL/uL — ABNORMAL LOW (ref 4.22–5.81)
RDW: 16.7 % — ABNORMAL HIGH (ref 11.5–15.5)
WBC: 6.6 K/uL (ref 4.0–10.5)
nRBC: 0 % (ref 0.0–0.2)

## 2023-04-24 LAB — PREPARE RBC (CROSSMATCH)

## 2023-04-24 LAB — SAMPLE TO BLOOD BANK

## 2023-04-24 MED ORDER — SODIUM CHLORIDE 0.9% FLUSH
10.0000 mL | Freq: Once | INTRAVENOUS | Status: AC
  Administered 2023-04-24: 10 mL via INTRAVENOUS

## 2023-04-24 MED ORDER — HEPARIN SOD (PORK) LOCK FLUSH 100 UNIT/ML IV SOLN
500.0000 [IU] | Freq: Once | INTRAVENOUS | Status: AC
  Administered 2023-04-24: 500 [IU] via INTRAVENOUS

## 2023-04-24 NOTE — Progress Notes (Signed)
Patients port flushed without difficulty.  Good blood return noted with no bruising or swelling noted at site.  Band aid applied.  VSS with discharge and left in satisfactory condition with no s/s of distress noted.   

## 2023-04-24 NOTE — Progress Notes (Signed)
CRITICAL VALUE ALERT Critical value received:  HGB 5.8 Date of notification:  04-24-2023 Time of notification: 09:12 am. Critical value read back:  Yes.   Nurse who received alert:  B.Yaffa Seckman RN.  MD notified time and response:  Dr. Ellin Saba @ 09:17 am.

## 2023-04-24 NOTE — Progress Notes (Signed)
  Critical value received:  HGB 5.8 Date of notification:  04-24-2023 Time of notification: 09:12 am. Critical value read back:  Yes.   Nurse who received alert:  B.Spruha Weight RN.  MD notified time and response:  Dr. Ellin Saba @ 09:17 am.   Orders received to infuse 2 units of blood 04-25-2023.

## 2023-04-24 NOTE — Addendum Note (Signed)
Addended by: Dicky Doe D on: 04/24/2023 09:33 AM   Modules accepted: Orders

## 2023-04-25 ENCOUNTER — Inpatient Hospital Stay: Payer: Medicaid Other

## 2023-04-25 VITALS — BP 94/62 | HR 67 | Temp 97.8°F | Resp 18

## 2023-04-25 DIAGNOSIS — C159 Malignant neoplasm of esophagus, unspecified: Secondary | ICD-10-CM

## 2023-04-25 DIAGNOSIS — Z5111 Encounter for antineoplastic chemotherapy: Secondary | ICD-10-CM | POA: Diagnosis not present

## 2023-04-25 DIAGNOSIS — D649 Anemia, unspecified: Secondary | ICD-10-CM

## 2023-04-25 DIAGNOSIS — Z23 Encounter for immunization: Secondary | ICD-10-CM

## 2023-04-25 LAB — IRON AND TIBC
Iron: 8 ug/dL — ABNORMAL LOW (ref 45–182)
Saturation Ratios: 7 % — ABNORMAL LOW (ref 17.9–39.5)
TIBC: 122 ug/dL — ABNORMAL LOW (ref 250–450)
UIBC: 114 ug/dL

## 2023-04-25 LAB — VITAMIN B12: Vitamin B-12: 412 pg/mL (ref 180–914)

## 2023-04-25 LAB — FERRITIN: Ferritin: 117 ng/mL (ref 24–336)

## 2023-04-25 LAB — FOLATE: Folate: 8.1 ng/mL (ref >5.9)

## 2023-04-25 MED ORDER — INFLUENZA VIRUS VACC SPLIT PF (FLUZONE) 0.5 ML IM SUSY
0.5000 mL | PREFILLED_SYRINGE | Freq: Once | INTRAMUSCULAR | Status: AC
  Administered 2023-04-25: 0.5 mL via INTRAMUSCULAR
  Filled 2023-04-25: qty 0.5

## 2023-04-25 MED ORDER — SODIUM CHLORIDE 0.9% FLUSH
10.0000 mL | INTRAVENOUS | Status: AC | PRN
  Administered 2023-04-25: 10 mL

## 2023-04-25 MED ORDER — ACETAMINOPHEN 325 MG PO TABS: 650.0000 mg | ORAL_TABLET | Freq: Once | ORAL | Status: DC

## 2023-04-25 MED ORDER — SODIUM CHLORIDE 0.9% IV SOLUTION
250.0000 mL | Freq: Once | INTRAVENOUS | Status: AC
  Administered 2023-04-25: 250 mL via INTRAVENOUS

## 2023-04-25 MED ORDER — DIPHENHYDRAMINE HCL 25 MG PO CAPS
25.0000 mg | ORAL_CAPSULE | Freq: Once | ORAL | Status: AC
  Administered 2023-04-25: 25 mg via ORAL
  Filled 2023-04-25: qty 1

## 2023-04-25 MED ORDER — HEPARIN SOD (PORK) LOCK FLUSH 100 UNIT/ML IV SOLN
500.0000 [IU] | Freq: Every day | INTRAVENOUS | Status: AC | PRN
  Administered 2023-04-25: 500 [IU]

## 2023-04-25 NOTE — Progress Notes (Signed)
Patient presents today for 2 units of PRBC.  Patient is in satisfactory condition with no new complaints voiced. Vital signs are stable.  Additional labs drawn today per Dr. Ellin Saba.  Hemoglobin on 04/24/2023 was 5.8.  Tylenol was taken at 0745 at home prior to visit so we will hold that today.  We will proceed with transfusions per MD orders.   Flulaval vaccine given today.   Patient tolerated transfusions and flu vaccine well with no complaints voiced.  Patient left via wheelchair with wife in stable condition.  Vital signs stable at discharge.  Follow up as scheduled.

## 2023-04-25 NOTE — Patient Instructions (Signed)

## 2023-04-26 LAB — TYPE AND SCREEN
ABO/RH(D): O POS
Antibody Screen: NEGATIVE
Unit division: 0
Unit division: 0

## 2023-04-26 LAB — BPAM RBC
Blood Product Expiration Date: 202410032359
Blood Product Expiration Date: 202410032359
ISSUE DATE / TIME: 202409110917
ISSUE DATE / TIME: 202409111058
Unit Type and Rh: 5100
Unit Type and Rh: 5100

## 2023-05-01 ENCOUNTER — Inpatient Hospital Stay: Payer: Medicaid Other

## 2023-05-01 DIAGNOSIS — C159 Malignant neoplasm of esophagus, unspecified: Secondary | ICD-10-CM

## 2023-05-01 DIAGNOSIS — D649 Anemia, unspecified: Secondary | ICD-10-CM

## 2023-05-01 DIAGNOSIS — Z95828 Presence of other vascular implants and grafts: Secondary | ICD-10-CM

## 2023-05-01 DIAGNOSIS — Z5111 Encounter for antineoplastic chemotherapy: Secondary | ICD-10-CM | POA: Diagnosis not present

## 2023-05-01 LAB — CBC
HCT: 22.6 % — ABNORMAL LOW (ref 39.0–52.0)
Hemoglobin: 7.6 g/dL — ABNORMAL LOW (ref 13.0–17.0)
MCH: 27.9 pg (ref 26.0–34.0)
MCHC: 33.6 g/dL (ref 30.0–36.0)
MCV: 83.1 fL (ref 80.0–100.0)
Platelets: 273 10*3/uL (ref 150–400)
RBC: 2.72 MIL/uL — ABNORMAL LOW (ref 4.22–5.81)
RDW: 17 % — ABNORMAL HIGH (ref 11.5–15.5)
WBC: 9.3 10*3/uL (ref 4.0–10.5)
nRBC: 0 % (ref 0.0–0.2)

## 2023-05-01 LAB — SAMPLE TO BLOOD BANK

## 2023-05-01 LAB — PREPARE RBC (CROSSMATCH)

## 2023-05-01 MED ORDER — SODIUM CHLORIDE 0.9% FLUSH
10.0000 mL | INTRAVENOUS | Status: DC | PRN
  Administered 2023-05-01: 10 mL via INTRAVENOUS

## 2023-05-01 MED ORDER — HEPARIN SOD (PORK) LOCK FLUSH 100 UNIT/ML IV SOLN
500.0000 [IU] | Freq: Once | INTRAVENOUS | Status: AC
  Administered 2023-05-01: 500 [IU] via INTRAVENOUS

## 2023-05-01 NOTE — Addendum Note (Signed)
Addended by: Harrel Lemon on: 05/01/2023 09:27 AM   Modules accepted: Orders

## 2023-05-01 NOTE — Progress Notes (Signed)
Patients port flushed without difficulty.  Good blood return noted with no bruising or swelling noted at site.  Band aid applied.  VSS with discharge and left in satisfactory condition with no s/s of distress noted.

## 2023-05-01 NOTE — Progress Notes (Signed)
One unit of blood ordered per MD. Will call patient to let him know about blood transfusion tomorrow.

## 2023-05-02 ENCOUNTER — Inpatient Hospital Stay: Payer: Medicaid Other

## 2023-05-02 DIAGNOSIS — C159 Malignant neoplasm of esophagus, unspecified: Secondary | ICD-10-CM

## 2023-05-02 DIAGNOSIS — D649 Anemia, unspecified: Secondary | ICD-10-CM

## 2023-05-02 DIAGNOSIS — Z5111 Encounter for antineoplastic chemotherapy: Secondary | ICD-10-CM | POA: Diagnosis not present

## 2023-05-02 MED ORDER — SODIUM CHLORIDE 0.9% FLUSH
10.0000 mL | INTRAVENOUS | Status: AC | PRN
  Administered 2023-05-02: 10 mL

## 2023-05-02 MED ORDER — SODIUM CHLORIDE 0.9% IV SOLUTION
250.0000 mL | Freq: Once | INTRAVENOUS | Status: AC
  Administered 2023-05-02: 250 mL via INTRAVENOUS

## 2023-05-02 MED ORDER — ACETAMINOPHEN 325 MG PO TABS
650.0000 mg | ORAL_TABLET | Freq: Once | ORAL | Status: AC
  Administered 2023-05-02: 650 mg via ORAL
  Filled 2023-05-02: qty 2

## 2023-05-02 MED ORDER — DIPHENHYDRAMINE HCL 25 MG PO CAPS
25.0000 mg | ORAL_CAPSULE | Freq: Once | ORAL | Status: AC
  Administered 2023-05-02: 25 mg via ORAL
  Filled 2023-05-02: qty 1

## 2023-05-02 MED ORDER — HEPARIN SOD (PORK) LOCK FLUSH 100 UNIT/ML IV SOLN
500.0000 [IU] | Freq: Every day | INTRAVENOUS | Status: AC | PRN
  Administered 2023-05-02: 500 [IU]

## 2023-05-02 NOTE — Patient Instructions (Signed)
[facility]  Discharge Instructions: Thank you for choosing [facility] to provide your oncology and hematology care.  If you have a lab appointment with the Cancer Center - please note that after April 8th, 2024, all labs will be drawn in the cancer center.  You do not have to check in or register with the main entrance as you have in the past but will complete your check-in in the cancer center.  Wear comfortable clothing and clothing appropriate for easy access to any Portacath or PICC line.   We strive to give you quality time with your provider. You may need to reschedule your appointment if you arrive late (15 or more minutes).  Arriving late affects you and other patients whose appointments are after yours.  Also, if you miss three or more appointments without notifying the office, you may be dismissed from the clinic at the provider's discretion.      For prescription refill requests, have your pharmacy contact our office and allow 72 hours for refills to be completed.    Today you received 1 unit of blood.     BELOW ARE SYMPTOMS THAT SHOULD BE REPORTED IMMEDIATELY: *FEVER GREATER THAN 100.4 F (38 C) OR HIGHER *CHILLS OR SWEATING *NAUSEA AND VOMITING THAT IS NOT CONTROLLED WITH YOUR NAUSEA MEDICATION *UNUSUAL SHORTNESS OF BREATH *UNUSUAL BRUISING OR BLEEDING *URINARY PROBLEMS (pain or burning when urinating, or frequent urination) *BOWEL PROBLEMS (unusual diarrhea, constipation, pain near the anus) TENDERNESS IN MOUTH AND THROAT WITH OR WITHOUT PRESENCE OF ULCERS (sore throat, sores in mouth, or a toothache) UNUSUAL RASH, SWELLING OR PAIN  UNUSUAL VAGINAL DISCHARGE OR ITCHING   Items with * indicate a potential emergency and should be followed up as soon as possible or go to the Emergency Department if any problems should occur.  Please show the CHEMOTHERAPY ALERT CARD or IMMUNOTHERAPY ALERT CARD at check-in to the Emergency Department and  triage nurse.  Should you have questions after your visit or need to cancel or reschedule your appointment, please contact [facility] [phone]  and follow the prompts.  Office hours are 8:00 a.m. to 4:30 p.m. Monday - Friday. Please note that voicemails left after 4:00 p.m. may not be returned until the following business day.  We are closed weekends and major holidays. You have access to a nurse at all times for urgent questions. Please call the main number to the clinic [phone] and follow the prompts.  For any non-urgent questions, you may also contact your provider using MyChart. We now offer e-Visits for anyone 18 and older to request care online for non-urgent symptoms. For details visit [email or web address].   Also download the MyChart app! Go to the app store, search "MyChart", open the app, select [facility], and log in with your MyChart username and password.   

## 2023-05-02 NOTE — Progress Notes (Signed)
Patient presents today for 1 unit of blood per provider's order. Vital signs stable and pt voiced no new complaints at this time.  Discharged from clinic via wheelchair in stable condition. Alert and oriented x 3. F/U with [facility] as scheduled.

## 2023-05-03 LAB — BPAM RBC
Blood Product Expiration Date: 202410072359
ISSUE DATE / TIME: 202409181044
Unit Type and Rh: 5100

## 2023-05-03 LAB — TYPE AND SCREEN
ABO/RH(D): O POS
Antibody Screen: NEGATIVE
Unit division: 0

## 2023-05-08 ENCOUNTER — Other Ambulatory Visit: Payer: Self-pay

## 2023-05-08 ENCOUNTER — Inpatient Hospital Stay: Payer: Medicaid Other

## 2023-05-08 DIAGNOSIS — C159 Malignant neoplasm of esophagus, unspecified: Secondary | ICD-10-CM

## 2023-05-08 DIAGNOSIS — Z5111 Encounter for antineoplastic chemotherapy: Secondary | ICD-10-CM | POA: Diagnosis not present

## 2023-05-08 DIAGNOSIS — D649 Anemia, unspecified: Secondary | ICD-10-CM

## 2023-05-08 LAB — SAMPLE TO BLOOD BANK

## 2023-05-08 LAB — CBC
HCT: 17.9 % — ABNORMAL LOW (ref 39.0–52.0)
Hemoglobin: 6 g/dL — CL (ref 13.0–17.0)
MCH: 28.3 pg (ref 26.0–34.0)
MCHC: 33.5 g/dL (ref 30.0–36.0)
MCV: 84.4 fL (ref 80.0–100.0)
Platelets: 325 10*3/uL (ref 150–400)
RBC: 2.12 MIL/uL — ABNORMAL LOW (ref 4.22–5.81)
RDW: 17.1 % — ABNORMAL HIGH (ref 11.5–15.5)
WBC: 7.6 10*3/uL (ref 4.0–10.5)
nRBC: 0 % (ref 0.0–0.2)

## 2023-05-08 LAB — PREPARE RBC (CROSSMATCH)

## 2023-05-08 MED ORDER — HEPARIN SOD (PORK) LOCK FLUSH 100 UNIT/ML IV SOLN
500.0000 [IU] | Freq: Once | INTRAVENOUS | Status: AC
  Administered 2023-05-08: 500 [IU] via INTRAVENOUS

## 2023-05-08 MED ORDER — SODIUM CHLORIDE 0.9% FLUSH
10.0000 mL | Freq: Once | INTRAVENOUS | Status: AC
  Administered 2023-05-08: 10 mL

## 2023-05-08 MED ORDER — HEPARIN SOD (PORK) LOCK FLUSH 100 UNIT/ML IV SOLN: 500.0000 [IU] | Freq: Once | INTRAVENOUS | Status: DC

## 2023-05-08 NOTE — Addendum Note (Signed)
Addended by: Dicky Doe D on: 05/08/2023 09:48 AM   Modules accepted: Orders

## 2023-05-08 NOTE — Progress Notes (Signed)
CRITICAL VALUE ALERT

## 2023-05-08 NOTE — Patient Instructions (Signed)
Had Port flushed today and will call with the blood results.

## 2023-05-09 ENCOUNTER — Inpatient Hospital Stay: Payer: Medicaid Other

## 2023-05-09 DIAGNOSIS — C159 Malignant neoplasm of esophagus, unspecified: Secondary | ICD-10-CM

## 2023-05-09 DIAGNOSIS — Z5111 Encounter for antineoplastic chemotherapy: Secondary | ICD-10-CM | POA: Diagnosis not present

## 2023-05-09 MED ORDER — ACETAMINOPHEN 325 MG PO TABS
650.0000 mg | ORAL_TABLET | Freq: Once | ORAL | Status: AC
  Administered 2023-05-09: 650 mg via ORAL
  Filled 2023-05-09: qty 2

## 2023-05-09 MED ORDER — SODIUM CHLORIDE 0.9% IV SOLUTION
250.0000 mL | Freq: Once | INTRAVENOUS | Status: AC
  Administered 2023-05-09: 250 mL via INTRAVENOUS

## 2023-05-09 MED ORDER — DIPHENHYDRAMINE HCL 25 MG PO CAPS
25.0000 mg | ORAL_CAPSULE | Freq: Once | ORAL | Status: AC
  Administered 2023-05-09: 25 mg via ORAL
  Filled 2023-05-09: qty 1

## 2023-05-09 MED ORDER — SODIUM CHLORIDE 0.9% FLUSH
10.0000 mL | INTRAVENOUS | Status: AC | PRN
  Administered 2023-05-09: 10 mL

## 2023-05-09 MED ORDER — HEPARIN SOD (PORK) LOCK FLUSH 100 UNIT/ML IV SOLN
500.0000 [IU] | Freq: Every day | INTRAVENOUS | Status: AC | PRN
  Administered 2023-05-09: 500 [IU]

## 2023-05-09 NOTE — Patient Instructions (Signed)

## 2023-05-09 NOTE — Progress Notes (Signed)
Patient presents today for 2 units of PRBC.  Patient is in satisfactory condition with only complaints of headache today.  Vital signs are stable.  Hemoglobin on 05/08/2023 was 6.0.  Tylenol and Benadryl given.  We will proceed with transfusions per provider orders.   Patient tolerated transfusions well with no complaints voiced.  Patient left via wheelchair with wife in stable condition.  Vital signs stable at discharge.  Follow up as scheduled.

## 2023-05-10 LAB — TYPE AND SCREEN
ABO/RH(D): O POS
Antibody Screen: NEGATIVE
Unit division: 0
Unit division: 0

## 2023-05-10 LAB — BPAM RBC
Blood Product Expiration Date: 202410122359
Unit Type and Rh: 5100
Unit Type and Rh: 5100
Unit Type and Rh: 5100
Unit Type and Rh: 5100

## 2023-05-11 ENCOUNTER — Emergency Department (HOSPITAL_COMMUNITY): Payer: Medicaid Other

## 2023-05-11 ENCOUNTER — Encounter (HOSPITAL_COMMUNITY): Payer: Self-pay

## 2023-05-11 ENCOUNTER — Other Ambulatory Visit: Payer: Self-pay

## 2023-05-11 ENCOUNTER — Inpatient Hospital Stay (HOSPITAL_COMMUNITY)
Admission: EM | Admit: 2023-05-11 | Discharge: 2023-05-15 | DRG: 374 | Disposition: E | Payer: Medicaid Other | Attending: Family Medicine | Admitting: Family Medicine

## 2023-05-11 DIAGNOSIS — Z9221 Personal history of antineoplastic chemotherapy: Secondary | ICD-10-CM

## 2023-05-11 DIAGNOSIS — I959 Hypotension, unspecified: Secondary | ICD-10-CM | POA: Diagnosis present

## 2023-05-11 DIAGNOSIS — Z79899 Other long term (current) drug therapy: Secondary | ICD-10-CM

## 2023-05-11 DIAGNOSIS — Z923 Personal history of irradiation: Secondary | ICD-10-CM | POA: Diagnosis not present

## 2023-05-11 DIAGNOSIS — E43 Unspecified severe protein-calorie malnutrition: Secondary | ICD-10-CM | POA: Diagnosis present

## 2023-05-11 DIAGNOSIS — E876 Hypokalemia: Secondary | ICD-10-CM | POA: Diagnosis present

## 2023-05-11 DIAGNOSIS — Z888 Allergy status to other drugs, medicaments and biological substances status: Secondary | ICD-10-CM | POA: Diagnosis not present

## 2023-05-11 DIAGNOSIS — Z8042 Family history of malignant neoplasm of prostate: Secondary | ICD-10-CM | POA: Diagnosis not present

## 2023-05-11 DIAGNOSIS — E119 Type 2 diabetes mellitus without complications: Secondary | ICD-10-CM | POA: Diagnosis present

## 2023-05-11 DIAGNOSIS — Z515 Encounter for palliative care: Secondary | ICD-10-CM | POA: Diagnosis not present

## 2023-05-11 DIAGNOSIS — R54 Age-related physical debility: Secondary | ICD-10-CM | POA: Diagnosis present

## 2023-05-11 DIAGNOSIS — K920 Hematemesis: Secondary | ICD-10-CM | POA: Diagnosis present

## 2023-05-11 DIAGNOSIS — K219 Gastro-esophageal reflux disease without esophagitis: Secondary | ICD-10-CM | POA: Diagnosis present

## 2023-05-11 DIAGNOSIS — Z7189 Other specified counseling: Secondary | ICD-10-CM | POA: Diagnosis not present

## 2023-05-11 DIAGNOSIS — Z681 Body mass index (BMI) 19 or less, adult: Secondary | ICD-10-CM

## 2023-05-11 DIAGNOSIS — Z79891 Long term (current) use of opiate analgesic: Secondary | ICD-10-CM

## 2023-05-11 DIAGNOSIS — Z931 Gastrostomy status: Secondary | ICD-10-CM

## 2023-05-11 DIAGNOSIS — D62 Acute posthemorrhagic anemia: Secondary | ICD-10-CM | POA: Diagnosis present

## 2023-05-11 DIAGNOSIS — I10 Essential (primary) hypertension: Secondary | ICD-10-CM | POA: Diagnosis present

## 2023-05-11 DIAGNOSIS — K922 Gastrointestinal hemorrhage, unspecified: Principal | ICD-10-CM | POA: Diagnosis present

## 2023-05-11 DIAGNOSIS — R578 Other shock: Secondary | ICD-10-CM | POA: Diagnosis present

## 2023-05-11 DIAGNOSIS — Z87891 Personal history of nicotine dependence: Secondary | ICD-10-CM

## 2023-05-11 DIAGNOSIS — C155 Malignant neoplasm of lower third of esophagus: Principal | ICD-10-CM | POA: Diagnosis present

## 2023-05-11 DIAGNOSIS — R64 Cachexia: Secondary | ICD-10-CM | POA: Diagnosis present

## 2023-05-11 DIAGNOSIS — C159 Malignant neoplasm of esophagus, unspecified: Secondary | ICD-10-CM | POA: Diagnosis present

## 2023-05-11 DIAGNOSIS — C7889 Secondary malignant neoplasm of other digestive organs: Secondary | ICD-10-CM | POA: Diagnosis present

## 2023-05-11 DIAGNOSIS — Z66 Do not resuscitate: Secondary | ICD-10-CM | POA: Diagnosis present

## 2023-05-11 LAB — CBC WITH DIFFERENTIAL/PLATELET
Abs Immature Granulocytes: 0.06 10*3/uL (ref 0.00–0.07)
Basophils Absolute: 0 10*3/uL (ref 0.0–0.1)
Basophils Relative: 0 %
Eosinophils Absolute: 0.1 10*3/uL (ref 0.0–0.5)
Eosinophils Relative: 1 %
HCT: 20.7 % — ABNORMAL LOW (ref 39.0–52.0)
Hemoglobin: 6.5 g/dL — CL (ref 13.0–17.0)
Immature Granulocytes: 1 %
Lymphocytes Relative: 23 %
Lymphs Abs: 2.3 10*3/uL (ref 0.7–4.0)
MCH: 28.1 pg (ref 26.0–34.0)
MCHC: 31.4 g/dL (ref 30.0–36.0)
MCV: 89.6 fL (ref 80.0–100.0)
Monocytes Absolute: 0.6 10*3/uL (ref 0.1–1.0)
Monocytes Relative: 6 %
Neutro Abs: 6.7 10*3/uL (ref 1.7–7.7)
Neutrophils Relative %: 69 %
Platelets: 313 10*3/uL (ref 150–400)
RBC: 2.31 MIL/uL — ABNORMAL LOW (ref 4.22–5.81)
RDW: 17.3 % — ABNORMAL HIGH (ref 11.5–15.5)
WBC: 9.7 10*3/uL (ref 4.0–10.5)
nRBC: 0 % (ref 0.0–0.2)

## 2023-05-11 LAB — COMPREHENSIVE METABOLIC PANEL
ALT: 38 U/L (ref 0–44)
AST: 101 U/L — ABNORMAL HIGH (ref 15–41)
Albumin: <1.5 g/dL — ABNORMAL LOW (ref 3.5–5.0)
Alkaline Phosphatase: 128 U/L — ABNORMAL HIGH (ref 38–126)
Anion gap: 12 (ref 5–15)
BUN: 13 mg/dL (ref 6–20)
CO2: 20 mmol/L — ABNORMAL LOW (ref 22–32)
Calcium: 7.2 mg/dL — ABNORMAL LOW (ref 8.9–10.3)
Chloride: 107 mmol/L (ref 98–111)
Creatinine, Ser: 0.7 mg/dL (ref 0.61–1.24)
GFR, Estimated: >60 mL/min (ref >60)
Glucose, Bld: 159 mg/dL — ABNORMAL HIGH (ref 70–99)
Potassium: 3.2 mmol/L — ABNORMAL LOW (ref 3.5–5.1)
Sodium: 139 mmol/L (ref 135–145)
Total Bilirubin: 0.3 mg/dL (ref 0.3–1.2)
Total Protein: 5.2 g/dL — ABNORMAL LOW (ref 6.5–8.1)

## 2023-05-11 LAB — CBG MONITORING, ED: Glucose-Capillary: 105 mg/dL — ABNORMAL HIGH (ref 70–99)

## 2023-05-11 LAB — LIPASE, BLOOD: Lipase: 19 U/L (ref 11–51)

## 2023-05-11 LAB — PREPARE RBC (CROSSMATCH)

## 2023-05-11 MED ORDER — SODIUM CHLORIDE 0.9% IV SOLUTION: Freq: Once | INTRAVENOUS | Status: AC

## 2023-05-11 MED ORDER — PANTOPRAZOLE INFUSION (NEW) - SIMPLE MED
8.0000 mg/h | INTRAVENOUS | Status: DC
  Administered 2023-05-11: 8 mg/h via INTRAVENOUS
  Filled 2023-05-11 (×2): qty 100

## 2023-05-11 MED ORDER — TRAZODONE HCL 50 MG PO TABS
50.0000 mg | ORAL_TABLET | Freq: Every day | ORAL | Status: DC
  Administered 2023-05-13: 50 mg
  Filled 2023-05-11: qty 1

## 2023-05-11 MED ORDER — PANTOPRAZOLE 80MG IVPB - SIMPLE MED
80.0000 mg | Freq: Once | INTRAVENOUS | Status: AC
  Administered 2023-05-11: 80 mg via INTRAVENOUS
  Filled 2023-05-11: qty 100

## 2023-05-11 MED ORDER — LACTATED RINGERS IV SOLN: INTRAVENOUS | Status: DC

## 2023-05-11 MED ORDER — PANTOPRAZOLE SODIUM 40 MG IV SOLR: 40.0000 mg | Freq: Two times a day (BID) | INTRAVENOUS | Status: DC

## 2023-05-11 MED ORDER — PREGABALIN 25 MG PO CAPS
50.0000 mg | ORAL_CAPSULE | Freq: Three times a day (TID) | ORAL | Status: DC
  Administered 2023-05-13: 50 mg
  Filled 2023-05-11 (×2): qty 2

## 2023-05-11 MED ORDER — ONDANSETRON HCL 4 MG/2ML IJ SOLN
4.0000 mg | Freq: Four times a day (QID) | INTRAMUSCULAR | Status: DC | PRN
  Administered 2023-05-12 – 2023-05-14 (×6): 4 mg via INTRAVENOUS
  Filled 2023-05-11 (×7): qty 2

## 2023-05-11 MED ORDER — LACTATED RINGERS IV BOLUS: 1000.0000 mL | Freq: Once | INTRAVENOUS | Status: DC

## 2023-05-11 MED ORDER — OXYCODONE HCL 5 MG PO TABS: 5.0000 mg | ORAL_TABLET | ORAL | Status: DC | PRN

## 2023-05-11 MED ORDER — SODIUM CHLORIDE 0.9% IV SOLUTION: Freq: Once | INTRAVENOUS | Status: DC

## 2023-05-11 MED ORDER — FAMOTIDINE 20 MG PO TABS: 20.0000 mg | ORAL_TABLET | Freq: Two times a day (BID) | ORAL | Status: DC

## 2023-05-11 MED ORDER — ONDANSETRON HCL 4 MG PO TABS: 4.0000 mg | ORAL_TABLET | Freq: Four times a day (QID) | ORAL | Status: DC | PRN

## 2023-05-11 MED ORDER — SODIUM CHLORIDE 0.9 % IV BOLUS
1000.0000 mL | Freq: Once | INTRAVENOUS | Status: AC
  Administered 2023-05-11: 1000 mL via INTRAVENOUS

## 2023-05-11 MED ORDER — POTASSIUM CHLORIDE 10 MEQ/100ML IV SOLN
10.0000 meq | INTRAVENOUS | Status: AC
  Administered 2023-05-12 (×4): 10 meq via INTRAVENOUS
  Filled 2023-05-11 (×4): qty 100

## 2023-05-11 MED ORDER — CHLORHEXIDINE GLUCONATE CLOTH 2 % EX PADS
6.0000 | MEDICATED_PAD | Freq: Every day | CUTANEOUS | Status: DC
  Administered 2023-05-11 – 2023-05-14 (×4): 6 via TOPICAL

## 2023-05-11 MED ORDER — METHADONE HCL 10 MG PO TABS: 10.0000 mg | ORAL_TABLET | Freq: Three times a day (TID) | ORAL | Status: DC

## 2023-05-11 MED ORDER — ACETAMINOPHEN 650 MG RE SUPP: 650.0000 mg | Freq: Four times a day (QID) | RECTAL | Status: DC | PRN

## 2023-05-11 MED ORDER — MORPHINE SULFATE (PF) 2 MG/ML IV SOLN
2.0000 mg | INTRAVENOUS | Status: DC | PRN
  Administered 2023-05-12 – 2023-05-14 (×18): 2 mg via INTRAVENOUS
  Filled 2023-05-11 (×20): qty 1

## 2023-05-11 MED ORDER — SODIUM CHLORIDE 0.9 % IV SOLN: INTRAVENOUS | Status: DC

## 2023-05-11 MED ORDER — ACETAMINOPHEN 325 MG PO TABS: 650.0000 mg | ORAL_TABLET | Freq: Four times a day (QID) | ORAL | Status: DC | PRN

## 2023-05-11 NOTE — ED Triage Notes (Signed)
Pt from home cc GI bleed starting tonight (vomiting). Pt vomiting on arrival. Hx of esophageal cancer. Hypotensive, 80s/40s. Receives cancer treatment at [facility]. EDP at bedside on arrival.

## 2023-05-11 NOTE — ED Notes (Addendum)
Pt 100% on room air Pt placed on 2L for comfort and blood oxygenation per EDP Pt actively vomited blood during triage with EDP at bedside

## 2023-05-11 NOTE — ED Notes (Signed)
Date and time results received: 04/18/2023 2221   Test: Hgb Critical Value: 6.5  Name of Provider Notified: Deretha Emory, MD

## 2023-05-11 NOTE — ED Provider Notes (Signed)
New Bag/Given 2023-05-30 2200)    ED Course/ Medical Decision Making/ A&P                                 Medical Decision Making Amount and/or Complexity of Data Reviewed Labs: ordered. Radiology: ordered.  Risk Prescription drug management.   CRITICAL CARE Performed by: Maxwell Henderson Total critical care time: 60 minutes Critical care time was exclusive of separately billable procedures and treating other patients. Critical care was necessary to treat or prevent imminent or life-threatening deterioration. Critical care was time spent personally by me on the following activities: development of treatment plan with patient and/or surrogate as well as nursing, discussions with consultants, evaluation of patient's response to treatment, examination of patient, obtaining history from patient or surrogate, ordering and performing treatments and interventions, ordering and review of laboratory studies, ordering and review of radiographic studies, pulse oximetry and re-evaluation of patient's condition.  Patient arrived hypotensive.  Immediately released 2 units of O- blood.  Started on Protonix drip.  Also ordered 2 more units of typed and crossed blood.  Discussed with on-call GI Dr. Tasia Henderson.  Was looking to see if there is any other acute interventions we could do.  He stated that there really is not.  There is  no way to really stop this bleeding.  Also did not recommend any kind of antibleeding medications or reversal medications.  Said they do not work on bleeding tumors.  Long conversation with patient and wife.  They want to give it a try with some blood resuscitation to see if it does stop.  Dr. Leonides Henderson will see patient tomorrow.  Did not feel he needed to come in tonight.  Patient is a DNR.  Will contact hospitalist for admission.  Patient does want to try blood transfusions IV resuscitation.  Patient clearly does not want to be intubated does not want to have CPR started if he goes into cardiac arrest.  Final Clinical Impression(s) / ED Diagnoses Final diagnoses:  Gastrointestinal hemorrhage, unspecified gastrointestinal hemorrhage type  Malignant neoplasm of lower third of esophagus [name])    Rx / DC Orders ED Discharge Orders     None         Maxwell Mulders, MD 30-May-2023 2232  [place] EMERGENCY DEPARTMENT AT [facility] Provider Note   CSN: [id number] Arrival date & time: 04/25/2023  2129     History  Chief Complaint  Patient presents with   GI Bleeding    Maxwell Henderson is a 57 y.o. male.  Patient brought in by EMS.  Patient started actively vomiting red blood at about 2030.  Patient was doing well earlier today.  Patient with known history of end-stage esophageal CA.  Known to have a lesion at the distal esophagus.  Originally treated at [facility] with onset in 2017.  Recently transferred to the cancer center here on September 3 seen by them.  They discussed hospice care at that time.  Because [facility] had determined that no further interventions would be helpful.  Patient required blood transfusion just on Wednesday of this week.  Received 2 units.  Patient has occasionally vomited blood but nothing quite this extensive.  Patient is a DNR.  They had talked all about hospice care but they had not been initiated yet.  EMS stated that blood pressures were in the 70s heart rate was around 120.  Patient has a port in left anterior chest and also has a G-tube present.  That was just placed a couple weeks ago.       Home Medications Prior to Admission medications   Medication Sig Start Date End Date Taking? Authorizing Provider  acetaminophen (TYLENOL) 500 MG tablet Take 1,000 mg by mouth every 6 (six) hours as needed.    [provider]  famotidine (PEPCID) 20 MG tablet Take 20 mg by mouth 2 (two) times daily. 05/22/19   [provider]  ferrous sulfate 300 (60 Fe) MG/5ML syrup Take 300 mg by mouth 3 (three) times a week.  Take 5 mL (300 mg total) by mouth 3 (three) times a week. 02/09/23   [provider]  methadone (DOLOPHINE) 10 MG tablet Take by mouth See admin instructions. Take 2.5 tablets by mouth 3 times a day 07/15/21   [provider]  NARCAN 4 MG/0.1ML LIQD nasal spray kit SMARTSIG:1 Spray(s) Both Nares Once  PRN 09/20/21   [provider]  ondansetron (ZOFRAN) 8 MG tablet Take 8 mg by mouth 3 (three) times daily. 09/07/21   [provider]  oxycodone (ROXICODONE) 30 MG immediate release tablet Take 30 mg by mouth every 4 (four) hours as needed for pain. 07/15/21   [provider]  pregabalin (LYRICA) 50 MG capsule Take 50 mg by mouth 3 (three) times daily. 02/10/22   [provider]  sucralfate (CARAFATE) 1 g tablet Take 1 tablet (1 g total) by mouth 4 (four) times daily -  with meals and at bedtime. 07/17/21 11/19/21  Sherryll Henderson, Maxwell D, DO  traZODone (DESYREL) 50 MG tablet Take 1 tablet (50 mg total) by mouth at bedtime. 08/14/21   Maxwell Hartshorn, MD      Allergies    Ketamine and related, Oxaliplatin, and Ketamine    Review of Systems   Review of Systems  Constitutional:  Negative for chills and fever.  HENT:  Negative for ear pain and sore throat.   Eyes:  Negative for pain and visual disturbance.  Respiratory:  Negative for cough and shortness of breath.   Cardiovascular:  Negative for chest pain and palpitations.  Gastrointestinal:  Positive for vomiting. Negative for abdominal pain.  Genitourinary:  Negative for dysuria and hematuria.  Musculoskeletal:  Negative for arthralgias and back pain.  Skin:  Negative for color  [place] [facility]Y DE[facility]MENT AT [facility] Provider Note   CSN: [id number] Arrival date & time: 04/25/2023  2129     History  Chief Complaint  Patient presents with   GI Bleeding    Maxwell Henderson is a 57 y.o. male.  Patient brought in by EMS.  Patient started actively vomiting red blood at about 2030.  Patient was doing well earlier today.  Patient with known history of end-stage esophageal CA.  Known to have a lesion at the distal esophagus.  Originally treated at [facility] with onset in 2017.  Recently transferred to the cancer center here on September 3 seen by them.  They discussed hospice care at that time.  Because [facility] had determined that no further interventions would be helpful.  Patient required blood transfusion just on Wednesday of this week.  Received 2 units.  Patient has occasionally vomited blood but nothing quite this extensive.  Patient is a DNR.  They had talked all about hospice care but they had not been initiated yet.  EMS stated that blood pressures were in the 70s heart rate was around 120.  Patient has a port in left anterior chest and also has a G-tube present.  That was just placed a couple weeks ago.       Home Medications Prior to Admission medications   Medication Sig Start Date End Date Taking? Authorizing Provider  acetaminophen (TYLENOL) 500 MG tablet Take 1,000 mg by mouth every 6 (six) hours as needed.    [provider]  famotidine (PEPCID) 20 MG tablet Take 20 mg by mouth 2 (two) times daily. 05/22/19   [provider]  ferrous sulfate 300 (60 Fe) MG/5ML syrup Take 300 mg by mouth 3 (three) times a week.  Take 5 mL (300 mg total) by mouth 3 (three) times a week. 02/09/23   [provider]  methadone (DOLOPHINE) 10 MG tablet Take by mouth See admin instructions. Take 2.5 tablets by mouth 3 times a day 07/15/21   [provider]  NARCAN 4 MG/0.1ML LIQD nasal spray kit SMARTSIG:1 Spray(s) Both Nares Once  PRN 09/20/21   [provider]  ondansetron (ZOFRAN) 8 MG tablet Take 8 mg by mouth 3 (three) times daily. 09/07/21   [provider]  oxycodone (ROXICODONE) 30 MG immediate release tablet Take 30 mg by mouth every 4 (four) hours as needed for pain. 07/15/21   [provider]  pregabalin (LYRICA) 50 MG capsule Take 50 mg by mouth 3 (three) times daily. 02/10/22   [provider]  sucralfate (CARAFATE) 1 g tablet Take 1 tablet (1 g total) by mouth 4 (four) times daily -  with meals and at bedtime. 07/17/21 11/19/21  Sherryll Henderson, Maxwell D, DO  traZODone (DESYREL) 50 MG tablet Take 1 tablet (50 mg total) by mouth at bedtime. 08/14/21   Maxwell Hartshorn, MD      Allergies    Ketamine and related, Oxaliplatin, and Ketamine    Review of Systems   Review of Systems  Constitutional:  Negative for chills and fever.  HENT:  Negative for ear pain and sore throat.   Eyes:  Negative for pain and visual disturbance.  Respiratory:  Negative for cough and shortness of breath.   Cardiovascular:  Negative for chest pain and palpitations.  Gastrointestinal:  Positive for vomiting. Negative for abdominal pain.  Genitourinary:  Negative for dysuria and hematuria.  Musculoskeletal:  Negative for arthralgias and back pain.  Skin:  Negative for color

## 2023-05-12 DIAGNOSIS — K922 Gastrointestinal hemorrhage, unspecified: Secondary | ICD-10-CM

## 2023-05-12 DIAGNOSIS — K219 Gastro-esophageal reflux disease without esophagitis: Secondary | ICD-10-CM

## 2023-05-12 DIAGNOSIS — K920 Hematemesis: Secondary | ICD-10-CM | POA: Diagnosis not present

## 2023-05-12 DIAGNOSIS — Z931 Gastrostomy status: Secondary | ICD-10-CM

## 2023-05-12 DIAGNOSIS — D62 Acute posthemorrhagic anemia: Secondary | ICD-10-CM | POA: Diagnosis not present

## 2023-05-12 DIAGNOSIS — C159 Malignant neoplasm of esophagus, unspecified: Secondary | ICD-10-CM

## 2023-05-12 DIAGNOSIS — E876 Hypokalemia: Secondary | ICD-10-CM

## 2023-05-12 DIAGNOSIS — E43 Unspecified severe protein-calorie malnutrition: Secondary | ICD-10-CM

## 2023-05-12 LAB — COMPREHENSIVE METABOLIC PANEL
ALT: 32 U/L (ref 0–44)
AST: 44 U/L — ABNORMAL HIGH (ref 15–41)
Albumin: <1.5 g/dL — ABNORMAL LOW (ref 3.5–5.0)
Alkaline Phosphatase: 103 U/L (ref 38–126)
Anion gap: 5 (ref 5–15)
BUN: 13 mg/dL (ref 6–20)
CO2: 24 mmol/L (ref 22–32)
Calcium: 6.7 mg/dL — ABNORMAL LOW (ref 8.9–10.3)
Chloride: 109 mmol/L (ref 98–111)
Creatinine, Ser: 0.46 mg/dL — ABNORMAL LOW (ref 0.61–1.24)
GFR, Estimated: >60 mL/min (ref >60)
Glucose, Bld: 118 mg/dL — ABNORMAL HIGH (ref 70–99)
Potassium: 4 mmol/L (ref 3.5–5.1)
Sodium: 138 mmol/L (ref 135–145)
Total Bilirubin: 0.4 mg/dL (ref 0.3–1.2)
Total Protein: 4.7 g/dL — ABNORMAL LOW (ref 6.5–8.1)

## 2023-05-12 LAB — CBC
HCT: 26.2 % — ABNORMAL LOW (ref 39.0–52.0)
HCT: 28.1 % — ABNORMAL LOW (ref 39.0–52.0)
HCT: 28.5 % — ABNORMAL LOW (ref 39.0–52.0)
Hemoglobin: 9 g/dL — ABNORMAL LOW (ref 13.0–17.0)
Hemoglobin: 9.8 g/dL — ABNORMAL LOW (ref 13.0–17.0)
Hemoglobin: 9.5 g/dL — ABNORMAL LOW (ref 13.0–17.0)
MCH: 29.7 pg (ref 26.0–34.0)
MCH: 30 pg (ref 26.0–34.0)
MCH: 28.9 pg (ref 26.0–34.0)
MCHC: 34.4 g/dL (ref 30.0–36.0)
MCHC: 34.9 g/dL (ref 30.0–36.0)
MCHC: 33.3 g/dL (ref 30.0–36.0)
MCV: 86.5 fL (ref 80.0–100.0)
MCV: 85.9 fL (ref 80.0–100.0)
MCV: 86.6 fL (ref 80.0–100.0)
Platelets: 166 10*3/uL (ref 150–400)
Platelets: 228 10*3/uL (ref 150–400)
Platelets: 202 10*3/uL (ref 150–400)
RBC: 3.03 MIL/uL — ABNORMAL LOW (ref 4.22–5.81)
RBC: 3.27 MIL/uL — ABNORMAL LOW (ref 4.22–5.81)
RBC: 3.29 MIL/uL — ABNORMAL LOW (ref 4.22–5.81)
RDW: 15.9 % — ABNORMAL HIGH (ref 11.5–15.5)
RDW: 16.1 % — ABNORMAL HIGH (ref 11.5–15.5)
RDW: 16 % — ABNORMAL HIGH (ref 11.5–15.5)
WBC: 8.8 10*3/uL (ref 4.0–10.5)
WBC: 12.6 10*3/uL — ABNORMAL HIGH (ref 4.0–10.5)
WBC: 11.5 10*3/uL — ABNORMAL HIGH (ref 4.0–10.5)
nRBC: 0 % (ref 0.0–0.2)
nRBC: 0 % (ref 0.0–0.2)
nRBC: 0 % (ref 0.0–0.2)

## 2023-05-12 LAB — MAGNESIUM: Magnesium: 1.7 mg/dL (ref 1.7–2.4)

## 2023-05-12 LAB — PROTIME-INR
INR: 1.2 (ref 0.8–1.2)
Prothrombin Time: 15.5 s — ABNORMAL HIGH (ref 11.4–15.2)

## 2023-05-12 LAB — HIV ANTIBODY (ROUTINE TESTING W REFLEX): HIV Screen 4th Generation wRfx: NONREACTIVE

## 2023-05-12 LAB — MRSA NEXT GEN BY PCR, NASAL: MRSA by PCR Next Gen: NOT DETECTED

## 2023-05-12 MED ORDER — METHADONE HCL 10 MG PO TABS
30.0000 mg | ORAL_TABLET | Freq: Three times a day (TID) | ORAL | Status: DC
  Administered 2023-05-13 – 2023-05-14 (×2): 30 mg
  Filled 2023-05-12 (×3): qty 3

## 2023-05-12 MED ORDER — MORPHINE SULFATE (PF) 4 MG/ML IV SOLN
4.0000 mg | Freq: Once | INTRAVENOUS | Status: AC
  Administered 2023-05-12: 4 mg via INTRAVENOUS
  Filled 2023-05-12: qty 1

## 2023-05-12 MED ORDER — ORAL CARE MOUTH RINSE
15.0000 mL | OROMUCOSAL | Status: DC
  Administered 2023-05-13 – 2023-05-14 (×3): 15 mL via OROMUCOSAL

## 2023-05-12 MED ORDER — PROCHLORPERAZINE EDISYLATE 10 MG/2ML IJ SOLN
10.0000 mg | INTRAMUSCULAR | Status: DC | PRN
  Administered 2023-05-12 – 2023-05-13 (×4): 10 mg via INTRAVENOUS
  Filled 2023-05-12 (×4): qty 2

## 2023-05-12 MED ORDER — ORAL CARE MOUTH RINSE: 15.0000 mL | OROMUCOSAL | Status: DC | PRN

## 2023-05-12 MED ORDER — FENTANYL CITRATE (PF) 100 MCG/2ML IJ SOLN
100.0000 ug | Freq: Once | INTRAMUSCULAR | Status: AC
  Administered 2023-05-12: 100 ug via INTRAVENOUS
  Filled 2023-05-12: qty 2

## 2023-05-12 MED ORDER — OSMOLITE 1.5 CAL PO LIQD: 1000.0000 mL | ORAL | Status: DC

## 2023-05-12 MED ORDER — FREE WATER: 120.0000 mL | Status: DC

## 2023-05-12 NOTE — Progress Notes (Signed)
Initial Nutrition Assessment  DOCUMENTATION CODES:   Underweight  INTERVENTION:   -Patient with J-tube which was placed 02/24/23 at [facility]. -Will monitor for potential diet advancement. -If diet unable to be advanced, recommend tube evaluation.  TF recommendation if needed: -Initiate Osmolite 1.5 @ 20 ml/hr, advance by 10 ml every 6 hours to goal rate of 70 ml/hr via J-tube. -Provides 2520 kcals, 105g protein and 1280 ml H2O -Free water recommendation: 120 ml every 4 hours  -If diet is advanced, recommend Ensure Plus High Protein po QID, each supplement provides 350 kcal and 20 grams of protein. In total this would provide ~1400 kcals and 80g protein.  NUTRITION DIAGNOSIS:   Increased nutrient needs related to cancer and cancer related treatments as evidenced by estimated needs.  GOAL:   Patient will meet greater than or equal to 90% of their needs  MONITOR:   Labs, Weight trends, I & O's, Diet advancement (TF resumption)  REASON FOR ASSESSMENT:   Consult Enteral/tube feeding initiation and management  ASSESSMENT:   57 y.o. male with medical history significant of esophageal cancer, diet-controlled diabetes, history of hypertension, currently borderline hypotensive, and more presents to the ED with a chief complaint of hematemesis.  Spoke with patient's wife. She reports pt was eating soft foods (mashed potatoes, fish, eggs) and drinking 3 Ensures (~1000 kcals and 48-60g protein depending on variation) the day PTA. Pt been unable to use his J-tube since leaving the hospital after placement in July.  Family brought in supplies to try and use tube this morning but something was missing so unable to use tube currently. Messaged MD regarding this and that pt was consuming PO PTA. Unsure if upper GI bleed, pt will be able to be on diet so recommend evaluation of J-tube.  Per weight records, pt has lost 10 lbs since 7/8 (7% wt loss x 2.5 months, significant for time frame).    Medications: Pepcid, KCl, Zofran, Compazine   Labs reviewed: CBGs: 105   NUTRITION - FOCUSED PHYSICAL EXAM:  Unable to complete, working remotely  Diet Order:   Diet Order             Diet NPO time specified  Diet effective now                   EDUCATION NEEDS:   No education needs have been identified at this time  Skin:  Skin Assessment: Reviewed RN Assessment  Last BM:  May 15, 2023  Height:   Ht Readings from Last 1 Encounters:  05-15-23 6\' 1"  (1.854 m)    Weight:   Wt Readings from Last 1 Encounters:  15-May-2023 58.8 kg    BMI:  Body mass index is 17.1 kg/m.  Estimated Nutritional Needs:   Kcal:  2300-2500  Protein:  105-120g  Fluid:  2.3L/day   Tilda Franco, MS, RD, LDN Inpatient Clinical Dietitian Contact information available via Amion

## 2023-05-12 NOTE — Consult Note (Addendum)
Maxwell Henderson, M.D. Gastroenterology & Hepatology                                           Patient Name: Maxwell Henderson Account #: @FLAACCTNO @   MRN: [id number] Admission Date: 05/31/23 Date of Evaluation:  05/12/2023 Time of Evaluation: 9:27 AM  Chief Complaint:  Hematemesis in setting Stage IV esophageal cancer   HPI:  This is a 57 y.o. 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   Patient is seen and examined beside with wife in room. Patient started to have bloody vomitus since 8 PM last night until 9 AM this morning . Atleast 8 episodes so far or more ; blood with clots . Patient was initially hypotensive and responded to fluids and 4 Unit PRBC.   As patient and the wife , they were told at [facility] 2 months ago because of the extend of disease with fistulas , they if patient starts to bleed again they would not have much to offer .  Patient denies any NSAID use . He has epigastric pain and tenderness which he reports is not new and has been there for many months  Past Medical History: SEE CHRONIC ISSSUES: Past Medical History:  Diagnosis Date   Diabetes mellitus without complication (HCC)    Hypertension    Iron deficiency anemia due to chronic blood loss 02/28/2017   Primary esophageal adenocarcinoma (HCC) 01/29/2017   Past Surgical History:  Past Surgical History:  Procedure Laterality Date   ESOPHAGOGASTRODUODENOSCOPY  07/29/2021   focal stenosis at the GE junction s/p dilation. Malignant-appearing mass in proximal stomach just below GE junction.   ESOPHAGOGASTRODUODENOSCOPY (EGD) WITH PROPOFOL N/A 01/16/2017   Procedure: ESOPHAGOGASTRODUODENOSCOPY (EGD) WITH PROPOFOL;  Surgeon: West Bali, MD;  [facility];  Service: Endoscopy;  Laterality:  N/A;  730    ESOPHAGOGASTRODUODENOSCOPY (EGD) WITH PROPOFOL N/A 11/01/2020   Procedure: ESOPHAGOGASTRODUODENOSCOPY (EGD) WITH PROPOFOL;  Surgeon: Corbin Ade, MD;  [facility];  Service: Endoscopy;  Laterality: N/A;   ESOPHAGOGASTRODUODENOSCOPY (EGD) WITH PROPOFOL N/A 07/17/2021   malignant esophagea tumor at GE junction, oozing gastric ulcer felt to be malignant.   ESOPHAGOGASTRODUODENOSCOPY (EGD) WITH PROPOFOL N/A 11/19/2021   Procedure: ESOPHAGOGASTRODUODENOSCOPY (EGD) WITH PROPOFOL;  Surgeon: Lanelle Bal, DO;  [facility];  Service: Endoscopy;  Laterality: N/A;   ESOPHAGOGASTRODUODENOSCOPY (EGD) WITH PROPOFOL N/A 02/10/2022   Procedure: ESOPHAGOGASTRODUODENOSCOPY (EGD) WITH PROPOFOL;  Surgeon: Lanelle Bal, DO;  [facility];  Service: Endoscopy;  Laterality: N/A;   ESOPHAGOGASTRODUODENOSCOPY (EGD) WITH PROPOFOL N/A 06/05/2022   Procedure: ESOPHAGOGASTRODUODENOSCOPY (EGD) WITH PROPOFOL;  Surgeon: Lanelle Bal, DO;  [facility];  Service: Endoscopy;  Laterality: N/A;   ESOPHAGOGASTRODUODENOSCOPY (EGD) WITH PROPOFOL N/A 10/02/2022   Procedure: ESOPHAGOGASTRODUODENOSCOPY (EGD) WITH PROPOFOL;  Surgeon: Dolores Frame, MD;  [facility];  Service: Gastroenterology;  Laterality: N/A;   IR FLUORO GUIDE PORT INSERTION RIGHT  02/13/2017   IR US GUIDE VASC ACCESS RIGHT  02/13/2017   lipoma removal     PORTA CATH INSERTION Right 02/13/2017   SAVORY DILATION N/A 01/16/2017   Procedure: SAVORY DILATION;  Surgeon: West Bali, MD;  [facility];  Service: Endoscopy;  Laterality: N/A;   Family History:  Family History  Problem Relation Age of Onset   Prostate cancer Father    Colon cancer Neg Hx    Colon polyps Neg Hx    Social History:  Social History   Tobacco Use   Smoking status: Former    Current packs/day: 0.00    Types: Cigarettes    Quit date: 08/14/2016    Years since quitting: 6.7    Smokeless tobacco: Never  Vaping Use   Vaping status: Never Used  Substance Use Topics   Alcohol use: No    Comment: hx heavy alcohol, hasn't drank in 28 yrs   Drug use: Not Currently    Frequency: 2.0 times per week    Types: Marijuana    Home Medications:  Prior to Admission medications   Medication Sig Start Date End Date Taking? Authorizing Provider  acetaminophen (TYLENOL) 500 MG tablet Take 1,000 mg by mouth every 6 (six) hours as needed.    [provider]  famotidine (PEPCID) 20 MG tablet Take 20 mg by mouth 2 (two) times daily. 05/22/19   [provider]  ferrous sulfate 300 (60 Fe) MG/5ML syrup Take 300 mg by mouth 3 (three) times a week.  Take 5 mL (300 mg total) by mouth 3 (three) times a week. 02/09/23   [provider]  methadone (DOLOPHINE) 10 MG tablet Take by mouth See admin instructions. Take 2.5 tablets by mouth 3 times a day 07/15/21   [provider]  NARCAN 4 MG/0.1ML LIQD nasal spray kit SMARTSIG:1 Spray(s) Both Nares Once PRN 09/20/21   [provider]  ondansetron (ZOFRAN) 8 MG tablet Take 8 mg by mouth 3 (three) times daily. 09/07/21   [provider]  oxycodone (ROXICODONE) 30 MG immediate release tablet Take 30 mg by mouth every 4 (four) hours as needed for pain. 07/15/21   [provider]  pregabalin (LYRICA) 50 MG capsule Take 50 mg by mouth 3 (three) times daily. 02/10/22   [provider]  sucralfate (CARAFATE) 1 g tablet Take 1 tablet (1 g total) by mouth 4 (four) times daily -  with meals and at bedtime. 07/17/21 11/19/21  Sherryll Burger, Pratik D, DO  traZODone (DESYREL) 50 MG tablet Take 1 tablet (50 mg total) by mouth at bedtime. 08/14/21   Catarina Hartshorn, MD    Inpatient Medications:  Current Facility-Administered Medications:    0.9 %  sodium chloride infusion (Manually program via Guardrails IV Fluids), , Intravenous, Once, Zierle-Ghosh, Asia B, DO   0.9 %  sodium chloride infusion, , Intravenous,  Continuous, Zierle-Ghosh, Asia B, DO, Last Rate: 100 mL/hr at 05/12/23 0830, New Bag at 05/12/23 0830   acetaminophen (TYLENOL) tablet 650 mg, 650 mg, Per Tube, Q6H PRN **OR** acetaminophen (TYLENOL) suppository 650 mg, 650 mg, Rectal, Q6H PRN, Zierle-Ghosh, Asia B, DO   Chlorhexidine Gluconate Cloth 2 % PADS 6 each, 6 each, Topical, Daily, Zierle-Ghosh, Asia B, DO, 6 each at 05/12/23 0829   famotidine (PEPCID) tablet 20 mg, 20 mg, Per Tube, BID, Zierle-Ghosh, Asia B, DO   methadone (DOLOPHINE) tablet 30 mg, 30 mg, Per Tube, Q8H, Zierle-Ghosh, Asia B, DO   morphine (PF) 2 MG/ML injection 2 mg, 2 mg, Intravenous, Q2H PRN, Zierle-Ghosh, Asia B, DO, 2 mg at 05/12/23 0618   ondansetron (ZOFRAN) tablet 4 mg, 4 mg, Oral, Q6H PRN **OR** ondansetron (ZOFRAN) injection 4 mg, 4 mg, Intravenous, Q6H PRN, Zierle-Ghosh,  Asia B, DO, 4 mg at 05/12/23 6962   Oral care mouth rinse, 15 mL, Mouth Rinse, 4 times per day, Zierle-Ghosh, Asia B, DO   Oral care mouth rinse, 15 mL, Mouth Rinse, PRN, Zierle-Ghosh, Asia B, DO   oxyCODONE (Oxy IR/ROXICODONE) immediate release tablet 5 mg, 5 mg, Per Tube, Q4H PRN, Zierle-Ghosh, Asia B, DO   [START ON 05/15/2023] pantoprazole (PROTONIX) injection 40 mg, 40 mg, Intravenous, Q12H, Zierle-Ghosh, Asia B, DO   pregabalin (LYRICA) capsule 50 mg, 50 mg, Oral, TID, Zierle-Ghosh, Asia B, DO   prochlorperazine (COMPAZINE) injection 10 mg, 10 mg, Intravenous, Q4H PRN, Johnson, Clanford L, MD, 10 mg at 05/12/23 0827   traZODone (DESYREL) tablet 50 mg, 50 mg, Per Tube, QHS, Zierle-Ghosh, Asia B, DO Allergies: Ketamine and related, Oxaliplatin, and Ketamine  Complete Review of Systems: GENERAL: negative for malaise, night sweats RESPIRATORY: Negative for cough, wheezing CARDIOVASCULAR: Negative for chest pain, leg swelling, palpitations, orthopnea GI: SEE HPI MUSCULOSKELETAL: Negative for joint pain or swelling, back pain, and muscle pain. SKIN: Negative for lesions, rash PSYCH: Negative  for sleep disturbance, mood disorder and recent psychosocial stressors. HEMATOLOGY Negative for prolonged bleeding, bruising easily, and swollen nodes. ENDOCRINE: Negative for cold or heat intolerance, polyuria, polydipsia and goiter.  Physical Exam: BP (!) 138/91   Pulse 90   Temp 97.8 F (36.6 C) (Axillary)   Resp (!) 21   Ht 6\' 1"  (1.854 m)   Wt 58.8 kg   SpO2 100%   BMI 17.10 kg/m  GENERAL: Cachectic  HEENT: Temporal wasting NECK: Supple. No masses LUNGS: Clear to auscultation. No presence of rhonchi/wheezing/rales. Adequate chest expansion HEART: RRR, normal s1 and s2. ABDOMEN: Soft, epigastric tenderness, and nondistended. BS +. No masses.  Laboratory Data CBC:     Component Value Date/Time   WBC 12.6 (H) 05/12/2023 0757   RBC 3.27 (L) 05/12/2023 0757   HGB 9.8 (L) 05/12/2023 0757   HCT 28.1 (L) 05/12/2023 0757   PLT 228 05/12/2023 0757   MCV 85.9 05/12/2023 0757   MCH 30.0 05/12/2023 0757   MCHC 34.9 05/12/2023 0757   RDW 16.1 (H) 05/12/2023 0757   LYMPHSABS 2.3 04/28/2023 2150   MONOABS 0.6 05/13/2023 2150   EOSABS 0.1 04/23/2023 2150   BASOSABS 0.0 04/20/2023 2150   COAG:  Lab Results  Component Value Date   INR 1.2 05/12/2023   INR 1.05 02/13/2017   INR 1.07 02/02/2017    BMP:     Latest Ref Rng & Units 05/12/2023    3:40 AM 04/27/2023    9:50 PM 04/17/2023    9:08 AM  BMP  Glucose 70 - 99 mg/dL 952  841  324   BUN 6 - 20 mg/dL 13  13  12    Creatinine 0.61 - 1.24 mg/dL 4.01  0.27  2.53   Sodium 135 - 145 mmol/L 138  139  132   Potassium 3.5 - 5.1 mmol/L 4.0  3.2  3.8   Chloride 98 - 111 mmol/L 109  107  96   CO2 22 - 32 mmol/L 24  20  30    Calcium 8.9 - 10.3 mg/dL 6.7  7.2  7.5     HEPATIC:     Latest Ref Rng & Units 05/12/2023    3:40 AM 04/27/2023    9:50 PM 04/17/2023    9:08 AM  Hepatic Function  Total Protein 6.5 - 8.1 g/dL 4.7  5.2  5.7   Albumin 3.5 - 5.0 g/dL <6.6  <  1.5  1.6   AST 15 - 41 U/L 44  101  16   ALT 0 - 44 U/L 32  38  15    Alk Phosphatase 38 - 126 U/L 103  128  71   Total Bilirubin 0.3 - 1.2 mg/dL 0.4  0.3  0.5     CARDIAC:  Lab Results  Component Value Date   TROPONINI <0.03 02/02/2019     Imaging: I personally reviewed and interpreted the available imaging.  Assessment & Plan: This is a 57 y.o. 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   #Hematemesis likely from known bleeding Esophageal adenocarcinoma  Another differential is migrated esopahgeal stent for which we wont be able to intervene would EGD would only be diagnostic   GI bleed from bleeding malignant tumor has limited intervention ; such as hemospray which is a temporary measure   Patient was initially hypotensive and responded to fluids and 4 Unit PRBC.  HBG: 6.0----->9 Patient currently HD stable   As patient and the wife , they were told at [facility] 2 months ago because of the extend of disease with fistulas , they if patient starts to bleed again they would not have much to offer .  Myself and Dr Johnnette Litter ( Anesthesia) attending had discussion with the patient and wife in ICU regarding the risk ,benefit , limitation and alternatives to endoscopic evaluation . They do understand also from previous discussions at [facility] that patient has untreated Esophageal tumor which is likely oozing and endoscopic intervention in this case mostly limited and temporary. They understand that because of need for intubation there is a high likely chance that patient may need to be on ventilator given esophageal fistula and intervention may worsening respiratory status . They have decided against any endoscopic evaluation at this time and want to be managed conservatively .  Recommend establishing goals of care as patient was last seen by Palliative  medicine 04/17/2023 at [facility] and there were discussions about Hospice care.  Maxwell Lawman, MD Gastroenterology and Hepatology [facility] Gastroenterology  This chart has been completed using [facility] Dictation software, and while attempts have been made to ensure accuracy , certain words and phrases may not be transcribed as intended

## 2023-05-12 NOTE — H&P (Signed)
History and Physical    Patient: Maxwell Henderson QQV:[id number] DOB: Dec 29, 1965 DOA: 05/23/2023 DOS: the patient was seen and examined on 05/12/2023 PCP: [facility]  Patient coming from: Home  Chief Complaint:  Chief Complaint  Patient presents with   GI Bleeding   HPI: Maxwell Henderson is a 57 y.o. male with medical history significant of esophageal cancer, diet-controlled diabetes, history of hypertension, currently borderline hypotensive, and more presents to the ED with a chief complaint of hematemesis.  Patient reports that started around 8:30 PM.  He was throwing up constantly.  Wife at bedside reports the whole trash can was full of blood.  Patient has been having a series of hematemesis in the past, but never this constant.  Patient likely has a bleeding tumor in his esophagus given his stage IV esophageal cancer and the fact that he is not getting transfused every week.  His last transfusion was Wednesday.  Patient reports his last meal was around 3-3 30.  He has no melena, no hematochezia.  He reports an associated periumbilical cramping abdominal pain.  Morphine did not touch the pain per his report.  Patient reports symptoms consistent with anemia like shortness of breath, dizziness, generalized weakness, palpitations.  His pain is no better since treatment in the ED, but he is otherwise feeling some improvement after his transfusion, but nowhere near feeling well.  Patient has no other complaints at this time.  Patient does not smoke and does not drink.  Patient reports that he would want to have a natural death if something happened where his heart stopped beating.  He is DNR/DNI. Review of Systems: As mentioned in the history of present illness. All other systems reviewed and are negative. Past Medical History:  Diagnosis Date   Diabetes mellitus without complication (HCC)    Hypertension    Iron deficiency anemia due to chronic blood loss 02/28/2017   Primary  esophageal adenocarcinoma (HCC) 01/29/2017   Past Surgical History:  Procedure Laterality Date   ESOPHAGOGASTRODUODENOSCOPY  07/29/2021   focal stenosis at the GE junction s/p dilation. Malignant-appearing mass in proximal stomach just below GE junction.   ESOPHAGOGASTRODUODENOSCOPY (EGD) WITH PROPOFOL N/A 01/16/2017   Procedure: ESOPHAGOGASTRODUODENOSCOPY (EGD) WITH PROPOFOL;  Surgeon: West Bali, MD;  [facility];  Service: Endoscopy;  Laterality: N/A;  730    ESOPHAGOGASTRODUODENOSCOPY (EGD) WITH PROPOFOL N/A 11/01/2020   Procedure: ESOPHAGOGASTRODUODENOSCOPY (EGD) WITH PROPOFOL;  Surgeon: Corbin Ade, MD;  [facility];  Service: Endoscopy;  Laterality: N/A;   ESOPHAGOGASTRODUODENOSCOPY (EGD) WITH PROPOFOL N/A 07/17/2021   malignant esophagea tumor at GE junction, oozing gastric ulcer felt to be malignant.   ESOPHAGOGASTRODUODENOSCOPY (EGD) WITH PROPOFOL N/A 11/19/2021   Procedure: ESOPHAGOGASTRODUODENOSCOPY (EGD) WITH PROPOFOL;  Surgeon: Lanelle Bal, DO;  [facility];  Service: Endoscopy;  Laterality: N/A;   ESOPHAGOGASTRODUODENOSCOPY (EGD) WITH PROPOFOL N/A 02/10/2022   Procedure: ESOPHAGOGASTRODUODENOSCOPY (EGD) WITH PROPOFOL;  Surgeon: Lanelle Bal, DO;  [facility];  Service: Endoscopy;  Laterality: N/A;   ESOPHAGOGASTRODUODENOSCOPY (EGD) WITH PROPOFOL N/A 06/05/2022   Procedure: ESOPHAGOGASTRODUODENOSCOPY (EGD) WITH PROPOFOL;  Surgeon: Lanelle Bal, DO;  [facility];  Service: Endoscopy;  Laterality: N/A;   ESOPHAGOGASTRODUODENOSCOPY (EGD) WITH PROPOFOL N/A 10/02/2022   Procedure: ESOPHAGOGASTRODUODENOSCOPY (EGD) WITH PROPOFOL;  Surgeon: Dolores Frame, MD;  [facility];  Service: Gastroenterology;  Laterality: N/A;   IR FLUORO GUIDE PORT INSERTION RIGHT  02/13/2017   IR US GUIDE  VASC ACCESS RIGHT  02/13/2017   lipoma removal     PORTA CATH INSERTION Right 02/13/2017   SAVORY  DILATION N/A 01/16/2017   Procedure: SAVORY DILATION;  Surgeon: West Bali, MD;  [facility];  Service: Endoscopy;  Laterality: N/A;   Social History:  reports that he quit smoking about 6 years ago. His smoking use included cigarettes. He has never used smokeless tobacco. He reports that he does not currently use drugs after having used the following drugs: Marijuana. Frequency: 2.00 times per week. He reports that he does not drink alcohol.  Allergies  Allergen Reactions   Ketamine And Related Other (See Comments)    intolerance   Oxaliplatin     Chills (intolerance)   Ketamine     Angioedema, GI Intolerance, Mental Status Changes, Diaphoresis / Sweating (intolerance)    Family History  Problem Relation Age of Onset   Prostate cancer Father    Colon cancer Neg Hx    Colon polyps Neg Hx     Prior to Admission medications   Medication Sig Start Date End Date Taking? Authorizing Provider  acetaminophen (TYLENOL) 500 MG tablet Take 1,000 mg by mouth every 6 (six) hours as needed.    [provider]  famotidine (PEPCID) 20 MG tablet Take 20 mg by mouth 2 (two) times daily. 05/22/19   [provider]  ferrous sulfate 300 (60 Fe) MG/5ML syrup Take 300 mg by mouth 3 (three) times a week.  Take 5 mL (300 mg total) by mouth 3 (three) times a week. 02/09/23   [provider]  methadone (DOLOPHINE) 10 MG tablet Take by mouth See admin instructions. Take 2.5 tablets by mouth 3 times a day 07/15/21   [provider]  NARCAN 4 MG/0.1ML LIQD nasal spray kit SMARTSIG:1 Spray(s) Both Nares Once PRN 09/20/21   [provider]  ondansetron (ZOFRAN) 8 MG tablet Take 8 mg by mouth 3 (three) times daily. 09/07/21   [provider]  oxycodone (ROXICODONE) 30 MG immediate release tablet Take 30 mg by mouth every 4 (four) hours as needed for pain. 07/15/21   [provider]  pregabalin (LYRICA) 50 MG capsule Take 50 mg by mouth 3  (three) times daily. 02/10/22   [provider]  sucralfate (CARAFATE) 1 g tablet Take 1 tablet (1 g total) by mouth 4 (four) times daily -  with meals and at bedtime. 07/17/21 11/19/21  Sherryll Burger, Pratik D, DO  traZODone (DESYREL) 50 MG tablet Take 1 tablet (50 mg total) by mouth at bedtime. 08/14/21   Catarina Hartshorn, MD    Physical Exam: Vitals:   05/12/23 0120 05/12/23 0200 05/12/23 0300 05/12/23 0400  BP: 103/68 121/82 121/80 125/88  Pulse: 82  98 86  Resp: 16 18 17 10   Temp: 97.8 F (36.6 C)     TempSrc: Axillary     SpO2: 100%  100% 99%  Weight:      Height:       1.  General: Patient lying supine in bed, cachectic, repositioning frequently and visibly in pain   2. Psychiatric: Alert and oriented x 3, mood and behavior normal for situation, pleasant and cooperative with exam   3. Neurologic: Speech and language are normal, face is symmetric, moves all 4 extremities voluntarily, at baseline without acute deficits on limited exam   4. HEENMT:  Head is atraumatic, normocephalic, pupils reactive to light, neck is supple, trachea is midline, mucous membranes are moist  5. Respiratory : Lungs are clear to auscultation bilaterally without wheezing, rhonchi, rales, no cyanosis, no increase in work of breathing or accessory muscle use   6. Cardiovascular : Heart rate normal, rhythm is regular, no murmurs, rubs or gallops, no peripheral edema, peripheral pulses palpated   7. Gastrointestinal:  Abdomen is soft, nondistended, periumbilical tenderness to palpation, bowel sounds active, no masses or organomegaly palpated   8. Skin:  Skin is warm, dry and intact without rashes, acute lesions, or ulcers on limited exam   9.Musculoskeletal:  No acute deformities or trauma, no asymmetry in tone, no peripheral edema, peripheral pulses palpated, no tenderness to palpation in the extremities  Data Reviewed: In the ED Temp 97.9, heart rate 99-118, respiratory rate 18-25, blood pressure  79/62 prior to transfusion-95/68 at admission, systolic 110s at time of my exam Satting 98-100% No leukocytosis, hemoglobin 6.5 Chemistry reveals a hypokalemia Patient was given a normal saline bolus 1 L, started on Protonix, transfused 3 units, and continued on IV fluids at 100 mL/h ED provider spoke with GI who reported they do not have a lot of treatments to offer at this time.  They were recommending symptom control with transfusions Admission was requested for upper GI bleed with acute blood loss anemia Assessment and Plan: * GI bleed - Upper GI bleed with hematemesis - Denies melena and hematochezia - Hemoglobin down to 6.5 - Recently transfused on Wednesday - Has been receiving weekly transfusions for symptomatic control - Continue Protonix twice daily - GI was consulted from the ER and reports that they do not have a lot to offer for discussed particular treatment - Family may need to think about hospice/palliative care?? - Transfused 3 units - Hemoglobin improved from 6.5>> 9.0 - Continue to monitor  Acute blood loss anemia - Secondary to GI bleed - Hemoglobin down to 6.5 - Patient has been getting transfused every week at the cancer center - Transfused 3 units here - Repeat hemoglobin has improved appropriately - Trend CBC - Continue to monitor  Protein-calorie malnutrition, severe (HCC) - Albumin is undetectable - Patient appears cachectic - PEG tube in place - Dietitian consult for tube feedings  PEG (percutaneous endoscopic gastrostomy) status (HCC) - Our facility is not yet able to use his tube because we do not have the supplies compatible with his specific tube - Wife is supposed to bring in supplies today so that he can take some of his longer acting pain medication - Continue to monitor  GERD (gastroesophageal reflux disease) - Continue Pepcid - Continue Protonix  Hypokalemia - Potassium 3.2 - Replace with 40 mEq of potassium - Trend in the  a.m.  Adenocarcinoma of esophagus, stage 4 (HCC) - Likely the source of the GI bleed - Consider palliative care consult - Pain control with patient's home medications including methadone and oxycodone - Pain control with breakthrough pain medications here including morphine and a one-time dose of fentanyl - Continue to monitor      Advance Care Planning:   Code Status: Limited: Do not attempt resuscitation (DNR) -DNR-LIMITED -Do Not Intubate/DNI   Consults: GI consulted from the ED  Family Communication: Wife at bedside  Severity of Illness: The appropriate patient status for this patient is INPATIENT. Inpatient status is judged to be reasonable and necessary in order to provide the required intensity of service to ensure the patient's safety. The patient's presenting symptoms, physical exam findings, and initial radiographic and laboratory data in the context of their chronic comorbidities is  felt to place them at high risk for further clinical deterioration. Furthermore, it is not anticipated that the patient will be medically stable for discharge from the hospital within 2 midnights of admission.   * I certify that at the point of admission it is my clinical judgment that the patient will require inpatient hospital care spanning beyond 2 midnights from the point of admission due to high intensity of service, high risk for further deterioration and high frequency of surveillance required.*  Author: Lilyan Gilford, DO 05/12/2023 5:22 AM  For on call review [email or web address].

## 2023-05-12 NOTE — Assessment & Plan Note (Signed)
Continue Pepcid.  Continue Protonix.

## 2023-05-12 NOTE — Assessment & Plan Note (Signed)
-   Likely the source of the GI bleed - Consider palliative care consult - Pain control with patient's home medications including methadone and oxycodone - Pain control with breakthrough pain medications here including morphine and a one-time dose of fentanyl - Continue to monitor

## 2023-05-12 NOTE — Assessment & Plan Note (Signed)
-   Potassium 3.2 - Replace with 40 mEq of potassium - Trend in the a.m.

## 2023-05-12 NOTE — Progress Notes (Signed)
ASSUMPTION OF CARE NOTE   05/12/2023 3:06 PM  Maxwell Henderson was seen and examined.  The H&P by the admitting provider, orders, imaging was reviewed.  Please see new orders.  Will continue to follow.   Vitals:   05/12/23 1354 05/12/23 1400  BP:  124/82  Pulse: 99 94  Resp: 13 10  Temp:    SpO2: 100% 100%    Results for orders placed or performed during the hospital encounter of June 02, 2023  MRSA Next Gen by PCR, Nasal   Specimen: Nasal Mucosa; Nasal Swab  Result Value Ref Range   MRSA by PCR Next Gen NOT DETECTED NOT DETECTED  Comprehensive metabolic panel  Result Value Ref Range   Sodium 139 135 - 145 mmol/L   Potassium 3.2 (L) 3.5 - 5.1 mmol/L   Chloride 107 98 - 111 mmol/L   CO2 20 (L) 22 - 32 mmol/L   Glucose, Bld 159 (H) 70 - 99 mg/dL   BUN 13 6 - 20 mg/dL   Creatinine, Ser 4.40 0.61 - 1.24 mg/dL   Calcium 7.2 (L) 8.9 - 10.3 mg/dL   Total Protein 5.2 (L) 6.5 - 8.1 g/dL   Albumin <3.4 (L) 3.5 - 5.0 g/dL   AST 742 (H) 15 - 41 U/L   ALT 38 0 - 44 U/L   Alkaline Phosphatase 128 (H) 38 - 126 U/L   Total Bilirubin 0.3 0.3 - 1.2 mg/dL   GFR, Estimated >59 >56 mL/min   Anion gap 12 5 - 15  Lipase, blood  Result Value Ref Range   Lipase 19 11 - 51 U/L  CBC with Differential/Platelet  Result Value Ref Range   WBC 9.7 4.0 - 10.5 K/uL   RBC 2.31 (L) 4.22 - 5.81 MIL/uL   Hemoglobin 6.5 (LL) 13.0 - 17.0 g/dL   HCT 38.7 (L) 56.4 - 33.2 %   MCV 89.6 80.0 - 100.0 fL   MCH 28.1 26.0 - 34.0 pg   MCHC 31.4 30.0 - 36.0 g/dL   RDW 95.1 (H) 88.4 - 16.6 %   Platelets 313 150 - 400 K/uL   nRBC 0.0 0.0 - 0.2 %   Neutrophils Relative % 69 %   Neutro Abs 6.7 1.7 - 7.7 K/uL   Lymphocytes Relative 23 %   Lymphs Abs 2.3 0.7 - 4.0 K/uL   Monocytes Relative 6 %   Monocytes Absolute 0.6 0.1 - 1.0 K/uL   Eosinophils Relative 1 %   Eosinophils Absolute 0.1 0.0 - 0.5 K/uL   Basophils Relative 0 %   Basophils Absolute 0.0 0.0 - 0.1 K/uL   Immature Granulocytes 1 %   Abs Immature  Granulocytes 0.06 0.00 - 0.07 K/uL  Comprehensive metabolic panel  Result Value Ref Range   Sodium 138 135 - 145 mmol/L   Potassium 4.0 3.5 - 5.1 mmol/L   Chloride 109 98 - 111 mmol/L   CO2 24 22 - 32 mmol/L   Glucose, Bld 118 (H) 70 - 99 mg/dL   BUN 13 6 - 20 mg/dL   Creatinine, Ser 0.63 (L) 0.61 - 1.24 mg/dL   Calcium 6.7 (L) 8.9 - 10.3 mg/dL   Total Protein 4.7 (L) 6.5 - 8.1 g/dL   Albumin <0.1 (L) 3.5 - 5.0 g/dL   AST 44 (H) 15 - 41 U/L   ALT 32 0 - 44 U/L   Alkaline Phosphatase 103 38 - 126 U/L   Total Bilirubin 0.4 0.3 - 1.2 mg/dL   GFR, Estimated >60 >10  mL/min   Anion gap 5 5 - 15  Magnesium  Result Value Ref Range   Magnesium 1.7 1.7 - 2.4 mg/dL  CBC  Result Value Ref Range   WBC 12.6 (H) 4.0 - 10.5 K/uL   RBC 3.27 (L) 4.22 - 5.81 MIL/uL   Hemoglobin 9.8 (L) 13.0 - 17.0 g/dL   HCT 16.1 (L) 09.6 - 04.5 %   MCV 85.9 80.0 - 100.0 fL   MCH 30.0 26.0 - 34.0 pg   MCHC 34.9 30.0 - 36.0 g/dL   RDW 40.9 (H) 81.1 - 91.4 %   Platelets 228 150 - 400 K/uL   nRBC 0.0 0.0 - 0.2 %  Protime-INR  Result Value Ref Range   Prothrombin Time 15.5 (H) 11.4 - 15.2 seconds   INR 1.2 0.8 - 1.2  CBC  Result Value Ref Range   WBC 8.8 4.0 - 10.5 K/uL   RBC 3.03 (L) 4.22 - 5.81 MIL/uL   Hemoglobin 9.0 (L) 13.0 - 17.0 g/dL   HCT 78.2 (L) 95.6 - 21.3 %   MCV 86.5 80.0 - 100.0 fL   MCH 29.7 26.0 - 34.0 pg   MCHC 34.4 30.0 - 36.0 g/dL   RDW 08.6 (H) 57.8 - 46.9 %   Platelets 166 150 - 400 K/uL   nRBC 0.0 0.0 - 0.2 %  CBC  Result Value Ref Range   WBC 11.5 (H) 4.0 - 10.5 K/uL   RBC 3.29 (L) 4.22 - 5.81 MIL/uL   Hemoglobin 9.5 (L) 13.0 - 17.0 g/dL   HCT 62.9 (L) 52.8 - 41.3 %   MCV 86.6 80.0 - 100.0 fL   MCH 28.9 26.0 - 34.0 pg   MCHC 33.3 30.0 - 36.0 g/dL   RDW 24.4 (H) 01.0 - 27.2 %   Platelets 202 150 - 400 K/uL   nRBC 0.0 0.0 - 0.2 %  CBG monitoring, ED  Result Value Ref Range   Glucose-Capillary 105 (H) 70 - 99 mg/dL  Type and screen [facility]  Result Value Ref  Range   ABO/RH(D) O POS    Antibody Screen NEG    Sample Expiration 06/09/23,2359    Unit Number [id number]    Blood Component Type RED CELLS,LR    Unit division 00    Status of Unit ISSUED    Unit tag comment EMERGENCY RELEASE    Transfusion Status OK TO TRANSFUSE    Crossmatch Result COMPATIBLE    Unit Number [id number]    Blood Component Type RBC LR PHER2    Unit division 00    Status of Unit ISSUED    Unit tag comment EMERGENCY RELEASE    Transfusion Status OK TO TRANSFUSE    Crossmatch Result COMPATIBLE    Unit Number [id number]    Blood Component Type RED CELLS,LR    Unit division 00    Status of Unit ALLOCATED    Transfusion Status OK TO TRANSFUSE    Crossmatch Result COMPATIBLE    Unit Number [id number]    Blood Component Type RED CELLS,LR    Unit division 00    Status of Unit ISSUED    Transfusion Status OK TO TRANSFUSE    Crossmatch Result COMPATIBLE   Prepare RBC (crossmatch)  Result Value Ref Range   Order Confirmation      ORDER PROCESSED BY BLOOD BANK Performed at [facility], [address], [place], [place] [address]   BPAM RBC  Result Value Ref Range   ISSUE DATE /  TIME [id number]    Blood Product Unit Number [id number]    Unit Type and Rh 9500    Blood Product Expiration Date 130865784696    ISSUING PHYSICIAN [id number]^ZACKOWSKI^SCOTT    ISSUE DATE / TIME 295284132440    Blood Product Unit Number [id number]    Unit Type and Rh 9500    Blood Product Expiration Date 474259563875    ISSUING PHYSICIAN [id number]^ZACKOWSKI^SCOTT    Blood Product Unit Number [id number]    Unit Type and Rh 5100    Blood Product Expiration Date 606301601093    ISSUING PHYSICIAN ^ZACKOWSKI^SCOTT    ISSUE DATE / TIME 235573220254    Blood Product Unit Number [id number]    PRODUCT CODE [id number]    Unit Type and Rh 5100    Blood Product Expiration Date 710626948546    ISSUING PHYSICIAN Colvin Caroli, MD [facility]    05/01/2023  9:30 PM How to contact the [facility] Attending or Consulting provider 7A - 7P or covering provider during after hours 7P -7A, for this patient?  Check the care team in [facility] and look for a) attending/consulting TRH provider listed and b) the [facility] team listed Log into [email or web address] and use 's universal password to access. If you do not have the password, please contact the hospital operator. Locate the [facility] provider you are looking for under [facility] and page to a number that you can be directly reached. If you still have difficulty reaching the provider, please page the [facility] (Director on Call) for the Hospitalists listed on amion for assistance.

## 2023-05-12 NOTE — Assessment & Plan Note (Signed)
-   Upper GI bleed with hematemesis - Denies melena and hematochezia - Hemoglobin down to 6.5 - Recently transfused on Wednesday - Has been receiving weekly transfusions for symptomatic control - Continue Protonix twice daily - GI was consulted from the ER and reports that they do not have a lot to offer for discussed particular treatment - Family may need to think about hospice/palliative care?? - Transfused 3 units - Hemoglobin improved from 6.5>> 9.0 - Continue to monitor

## 2023-05-12 NOTE — Progress Notes (Signed)
   05/12/23 1344  TOC Brief Assessment  Insurance and Status Reviewed  Patient has primary care physician Yes [facility])  Home environment has been reviewed From home with spouse  Prior level of function: Independent  Prior/Current Home Services No current home services  Social Determinants of Health Reivew SDOH reviewed no interventions necessary  Readmission risk has been reviewed Yes  Transition of care needs no transition of care needs at this time     Transition of Care Department [facility]) has reviewed patient and no TOC needs have been identified at this time. We will continue to monitor patient advancement through interdisciplinary progression rounds. If new patient transition needs arise, please place a TOC consult.

## 2023-05-12 NOTE — Assessment & Plan Note (Signed)
-   Our facility is not yet able to use his tube because we do not have the supplies compatible with his specific tube - Wife is supposed to bring in supplies today so that he can take some of his longer acting pain medication - Continue to monitor

## 2023-05-12 NOTE — Assessment & Plan Note (Signed)
-   Secondary to GI bleed - Hemoglobin down to 6.5 - Patient has been getting transfused every week at the cancer center - Transfused 3 units here - Repeat hemoglobin has improved appropriately - Trend CBC - Continue to monitor

## 2023-05-12 NOTE — Assessment & Plan Note (Signed)
-   Albumin is undetectable - Patient appears cachectic - PEG tube in place - Dietitian consult for tube feedings

## 2023-05-13 ENCOUNTER — Encounter (HOSPITAL_COMMUNITY): Payer: Self-pay | Admitting: Family Medicine

## 2023-05-13 ENCOUNTER — Inpatient Hospital Stay (HOSPITAL_COMMUNITY): Payer: Medicaid Other

## 2023-05-13 DIAGNOSIS — Z931 Gastrostomy status: Secondary | ICD-10-CM | POA: Diagnosis not present

## 2023-05-13 DIAGNOSIS — E43 Unspecified severe protein-calorie malnutrition: Secondary | ICD-10-CM | POA: Diagnosis not present

## 2023-05-13 DIAGNOSIS — R578 Other shock: Secondary | ICD-10-CM

## 2023-05-13 DIAGNOSIS — K920 Hematemesis: Secondary | ICD-10-CM | POA: Diagnosis not present

## 2023-05-13 DIAGNOSIS — D62 Acute posthemorrhagic anemia: Secondary | ICD-10-CM | POA: Diagnosis not present

## 2023-05-13 LAB — CBC
HCT: 23 % — ABNORMAL LOW (ref 39.0–52.0)
Hemoglobin: 7.6 g/dL — ABNORMAL LOW (ref 13.0–17.0)
MCH: 28.8 pg (ref 26.0–34.0)
MCHC: 33 g/dL (ref 30.0–36.0)
MCV: 87.1 fL (ref 80.0–100.0)
Platelets: 226 10*3/uL (ref 150–400)
RBC: 2.64 MIL/uL — ABNORMAL LOW (ref 4.22–5.81)
RDW: 15.9 % — ABNORMAL HIGH (ref 11.5–15.5)
WBC: 11.4 10*3/uL — ABNORMAL HIGH (ref 4.0–10.5)
nRBC: 0 % (ref 0.0–0.2)

## 2023-05-13 LAB — HEMOGLOBIN AND HEMATOCRIT, BLOOD
HCT: 27 % — ABNORMAL LOW (ref 39.0–52.0)
Hemoglobin: 8.9 g/dL — ABNORMAL LOW (ref 13.0–17.0)

## 2023-05-13 LAB — PREPARE RBC (CROSSMATCH)

## 2023-05-13 MED ORDER — FENTANYL CITRATE (PF) 100 MCG/2ML IJ SOLN
100.0000 ug | Freq: Once | INTRAMUSCULAR | Status: AC
  Administered 2023-05-13: 100 ug via INTRAVENOUS
  Filled 2023-05-13: qty 2

## 2023-05-13 MED ORDER — SODIUM CHLORIDE 0.9% IV SOLUTION: Freq: Once | INTRAVENOUS | Status: AC

## 2023-05-13 NOTE — Progress Notes (Signed)
PROGRESS NOTE   Maxwell Henderson  ZOX:[id number] DOB: 01/19/1966 DOA: June 01, 2023 PCP: [facility], [facility]   Chief Complaint  Patient presents with   GI Bleeding   Level of care: Stepdown  Brief Admission History:  57 y.o. male with medical history significant of esophageal cancer, diet-controlled diabetes, history of hypertension, currently borderline hypotensive, and more presents to the ED with a chief complaint of hematemesis.  Patient reports that started around 8:30 PM.  He was throwing up constantly.  Wife at bedside reports the whole trash can was full of blood.  Patient has been having a series of hematemesis in the past, but never this constant.  Patient likely has a bleeding tumor in his esophagus given his stage IV esophageal cancer and the fact that he is not getting transfused every week.  His last transfusion was Wednesday.  Patient reports his last meal was around 3-3 30.  He has no melena, no hematochezia.  He reports an associated periumbilical cramping abdominal pain.  Morphine did not touch the pain per his report.  Patient reports symptoms consistent with anemia like shortness of breath, dizziness, generalized weakness, palpitations.  His pain is no better since treatment in the ED, but he is otherwise feeling some improvement after his transfusion, but nowhere near feeling well.  Patient has no other complaints at this time.   Patient does not smoke and does not drink.  Patient reports that he would want to have a natural death if something happened where his heart stopped beating.  He is DNR/DNI.   Assessment and Plan:  Stage IV esophageal malignancy Hematemesis  Acute blood loss anemia  Hemorrhagic Shock  -discussion with wife at beside, pt is terminally ill and exhausted all options for any type of meaningful recovery.  His hg is down again to 7.6 after receiving 4 units, still actively bleeding; wife agreeable to transfusing PRBC today and would like to  discuss residential hospice placement with palliative care.  They will not be available until tomorrow.  Transfuse 2 units PRBC today;  continue symptom management.  Prognosis less than 2 weeks but most likely hours to days given ongoing hemorrhage.     Code Status:  DNR  Family Communication: wife at bedside  Disposition: anticipate transfer to residential hospice   Consultants:  GI Palliative care   Procedures:   Antimicrobials:    Subjective: Pt not clearly able to verbalize needs at the moment.   Objective: Vitals:   05/13/23 0711 05/13/23 0750 05/13/23 0800 05/13/23 0900  BP:   138/78 (!) 143/97  Pulse: (!) 101  83 83  Resp: (!) 24  10 17   Temp:  98.7 F (37.1 C)    TempSrc:  Axillary    SpO2: 92%  99% 97%  Weight:      Height:        Intake/Output Summary (Last 24 hours) at 05/13/2023 0957 Last data filed at 05/13/2023 0700 Gross per 24 hour  Intake 3164.23 ml  Output 204 ml  Net 2960.23 ml   Filed Weights   2023/06/01 2134 06/01/2023 2357  Weight: 54.4 kg 58.8 kg   Examination:  General exam: emaciated male, appears terminally ill; jaundiced skin appearance;   Respiratory system: shallow breathing bilateral. Cardiovascular system: normal S1 & S2 heard. No JVD, murmurs, rubs, gallops or clicks. No pedal edema. Gastrointestinal system: G tube in place Central nervous system: awake but somnolent.  Extremities: Symmetric 5 x 5 power. Skin: No rashes, lesions or ulcers. Psychiatry:  Judgement and insight appear normal. Mood & affect appropriate.   Data Reviewed: I have personally reviewed following labs and imaging studies  CBC: Recent Labs  Lab 05-15-23 2150 05/12/23 0340 05/12/23 0757 05/12/23 1221 05/13/23 0616  WBC 9.7 8.8 12.6* 11.5* 11.4*  NEUTROABS 6.7  --   --   --   --   HGB 6.5* 9.0* 9.8* 9.5* 7.6*  HCT 20.7* 26.2* 28.1* 28.5* 23.0*  MCV 89.6 86.5 85.9 86.6 87.1  PLT 313 166 228 202 226    Basic Metabolic Panel: Recent Labs  Lab  May 15, 2023 2150 05/12/23 0340  NA 139 138  K 3.2* 4.0  CL 107 109  CO2 20* 24  GLUCOSE 159* 118*  BUN 13 13  CREATININE 0.70 0.46*  CALCIUM 7.2* 6.7*  MG  --  1.7    CBG: Recent Labs  Lab 2023-05-15 2137  GLUCAP 105*    Recent Results (from the past 240 hour(s))  MRSA Next Gen by PCR, Nasal     Status: None   Collection Time: 05/15/2023 11:40 PM   Specimen: Nasal Mucosa; Nasal Swab  Result Value Ref Range Status   MRSA by PCR Next Gen NOT DETECTED NOT DETECTED Final    Comment: (NOTE) The GeneXpert MRSA Assay (FDA approved for NASAL specimens only), is one component of a comprehensive MRSA colonization surveillance program. It is not intended to diagnose MRSA infection nor to guide or monitor treatment for MRSA infections. Test performance is not FDA approved in patients less than 58 years old. Performed at [facility], [address] [place], [place] [address]      Radiology Studies: [facility] 1 View  Result Date: 05-15-2023 CLINICAL DATA:  Hematemesis. GI bleed. History of esophageal cancer. EXAM: PORTABLE CHEST 1 VIEW COMPARISON:  Chest radiograph dated 02/19/2023. FINDINGS: Right-sided Port-A-Cath with tip at the cavoatrial junction. No focal consolidation, pleural effusion, pneumothorax. The cardiac silhouette is within normal limits. Lower esophageal stent. No acute osseous pathology. IMPRESSION: 1. No active disease. 2. Lower esophageal stent. Electronically Signed   By: Elgie Collard M.D.   On: 05-15-2023 22:11    Scheduled Meds:  sodium chloride   Intravenous Once   sodium chloride   Intravenous Once   Chlorhexidine Gluconate Cloth  6 each Topical Daily   methadone  30 mg Per Tube Q8H   mouth rinse  15 mL Mouth Rinse 4 times per day   [START ON 05/15/2023] pantoprazole  40 mg Intravenous Q12H   pregabalin  50 mg Per Tube TID   traZODone  50 mg Per Tube QHS   Continuous Infusions:  sodium chloride 100 mL/hr at 05/13/23 0600     LOS: 2 days   Critical  Care Procedure Note Authorized and Performed by: Maryln Manuel MD  Total Critical Care time:  56 mins Due to a high probability of clinically significant, life threatening deterioration, the patient required my highest level of preparedness to intervene emergently and I personally spent this critical care time directly and personally managing the patient.  This critical care time included obtaining a history; examining the patient, pulse oximetry; ordering and review of studies; arranging urgent treatment with development of a management plan; evaluation of patient's response of treatment; frequent reassessment; and discussions with other providers.  This critical care time was performed to assess and manage the high probability of imminent and life threatening deterioration that could result in multi-organ failure.  It was exclusive of separately billable procedures and treating other  patients and teaching time.    Standley Dakins, MD How to contact the [facility] Attending or Consulting provider 7A - 7P or covering provider during after hours 7P -7A, for this patient?  Check the care team in [facility] and look for a) attending/consulting TRH provider listed and b) the [facility] team listed Log into [email or web address] and use [facility]'s universal password to access. If you do not have the password, please contact the hospital operator. Locate the [facility] provider you are looking for under [facility] and page to a number that you can be directly reached. If you still have difficulty reaching the provider, please page the [facility] (Director on Call) for the Hospitalists listed on amion for assistance.  05/13/2023, 9:57 AM  ]

## 2023-05-13 NOTE — Hospital Course (Signed)
57 y.o. male with medical history significant of esophageal cancer, diet-controlled diabetes, history of hypertension, currently borderline hypotensive, and more presents to the ED with a chief complaint of hematemesis.  Patient reports that started around 8:30 PM.  He was throwing up constantly.  Wife at bedside reports the whole trash can was full of blood.  Patient has been having a series of hematemesis in the past, but never this constant.  Patient likely has a bleeding tumor in his esophagus given his stage IV esophageal cancer and the fact that he is not getting transfused every week.  His last transfusion was Wednesday.  Patient reports his last meal was around 3-3 30.  He has no melena, no hematochezia.  He reports an associated periumbilical cramping abdominal pain.  Morphine did not touch the pain per his report.  Patient reports symptoms consistent with anemia like shortness of breath, dizziness, generalized weakness, palpitations.  His pain is no better since treatment in the ED, but he is otherwise feeling some improvement after his transfusion, but nowhere near feeling well.  Patient has no other complaints at this time.   Patient does not smoke and does not drink.  Patient reports that he would want to have a natural death if something happened where his heart stopped beating.  He is DNR/DNI.

## 2023-05-14 ENCOUNTER — Encounter (HOSPITAL_COMMUNITY): Payer: Self-pay | Admitting: Family Medicine

## 2023-05-14 DIAGNOSIS — K920 Hematemesis: Secondary | ICD-10-CM | POA: Diagnosis not present

## 2023-05-14 DIAGNOSIS — D62 Acute posthemorrhagic anemia: Secondary | ICD-10-CM | POA: Diagnosis not present

## 2023-05-14 DIAGNOSIS — Z7189 Other specified counseling: Secondary | ICD-10-CM

## 2023-05-14 DIAGNOSIS — Z515 Encounter for palliative care: Secondary | ICD-10-CM

## 2023-05-14 DIAGNOSIS — R578 Other shock: Secondary | ICD-10-CM | POA: Diagnosis not present

## 2023-05-14 DIAGNOSIS — C159 Malignant neoplasm of esophagus, unspecified: Secondary | ICD-10-CM | POA: Diagnosis not present

## 2023-05-14 DIAGNOSIS — E876 Hypokalemia: Secondary | ICD-10-CM | POA: Diagnosis not present

## 2023-05-14 LAB — BPAM RBC
Blood Product Expiration Date: 202410302359
Blood Product Expiration Date: 202410302359
Blood Product Expiration Date: 202410172359
Blood Product Expiration Date: 202410212359
ISSUE DATE / TIME: 202409291454
ISSUE DATE / TIME: 202409272149
ISSUE DATE / TIME: 202409272149
ISSUE DATE / TIME: 202409291227
ISSUE DATE / TIME: 202410302359
PRODUCT CODE: 202409272255
PRODUCT CODE: 202410302359
PRODUCT CODE: 202410302359
Unit Type and Rh: 5100
Unit Type and Rh: 5100
Unit Type and Rh: 5100
Unit Type and Rh: 5100
Unit Type and Rh: 5100
Unit Type and Rh: 9500
Unit Type and Rh: 9500

## 2023-05-14 LAB — TYPE AND SCREEN
ABO/RH(D): O POS
Antibody Screen: NEGATIVE
Unit division: 0
Unit division: 0
Unit division: 0
Unit division: 0
Unit division: 0

## 2023-05-14 LAB — CBC
HCT: 32.4 % — ABNORMAL LOW (ref 39.0–52.0)
Hemoglobin: 11.1 g/dL — ABNORMAL LOW (ref 13.0–17.0)
MCH: 29.8 pg (ref 26.0–34.0)
MCHC: 34.3 g/dL (ref 30.0–36.0)
MCV: 87.1 fL (ref 80.0–100.0)
Platelets: 193 10*3/uL (ref 150–400)
RBC: 3.72 MIL/uL — ABNORMAL LOW (ref 4.22–5.81)
RDW: 15.5 % (ref 11.5–15.5)
WBC: 10.6 10*3/uL — ABNORMAL HIGH (ref 4.0–10.5)
nRBC: 0 % (ref 0.0–0.2)

## 2023-05-14 MED ORDER — HALOPERIDOL LACTATE 2 MG/ML PO CONC: 0.5000 mg | ORAL | Status: DC | PRN

## 2023-05-14 MED ORDER — LORAZEPAM 2 MG/ML PO CONC: 1.0000 mg | ORAL | Status: DC | PRN

## 2023-05-14 MED ORDER — ACETAMINOPHEN 650 MG RE SUPP: 650.0000 mg | Freq: Four times a day (QID) | RECTAL | Status: DC | PRN

## 2023-05-14 MED ORDER — ONDANSETRON 4 MG PO TBDP: 4.0000 mg | ORAL_TABLET | Freq: Four times a day (QID) | ORAL | Status: DC | PRN

## 2023-05-14 MED ORDER — HALOPERIDOL LACTATE 5 MG/ML IJ SOLN: 0.5000 mg | INTRAMUSCULAR | Status: DC | PRN

## 2023-05-14 MED ORDER — GLYCOPYRROLATE 1 MG PO TABS: 1.0000 mg | ORAL_TABLET | ORAL | Status: DC | PRN

## 2023-05-14 MED ORDER — HALOPERIDOL 0.5 MG PO TABS: 0.5000 mg | ORAL_TABLET | ORAL | Status: DC | PRN

## 2023-05-14 MED ORDER — LORAZEPAM 1 MG PO TABS: 1.0000 mg | ORAL_TABLET | ORAL | Status: DC | PRN

## 2023-05-14 MED ORDER — ACETAMINOPHEN 325 MG PO TABS: 650.0000 mg | ORAL_TABLET | Freq: Four times a day (QID) | ORAL | Status: DC | PRN

## 2023-05-14 MED ORDER — GLYCOPYRROLATE 0.2 MG/ML IJ SOLN: 0.2000 mg | INTRAMUSCULAR | Status: DC | PRN

## 2023-05-14 MED ORDER — LORAZEPAM 2 MG/ML IJ SOLN: 1.0000 mg | INTRAMUSCULAR | Status: DC | PRN

## 2023-05-14 MED ORDER — ONDANSETRON HCL 4 MG/2ML IJ SOLN: 4.0000 mg | Freq: Four times a day (QID) | INTRAMUSCULAR | Status: DC | PRN

## 2023-05-14 MED ORDER — HYDROMORPHONE HCL 1 MG/ML IJ SOLN
1.0000 mg | INTRAMUSCULAR | Status: DC | PRN
  Administered 2023-05-14 (×3): 1 mg via INTRAVENOUS
  Filled 2023-05-14 (×3): qty 1

## 2023-05-14 MED ORDER — POLYVINYL ALCOHOL 1.4 % OP SOLN: 1.0000 [drp] | Freq: Four times a day (QID) | OPHTHALMIC | Status: DC | PRN

## 2023-05-14 MED ORDER — BIOTENE DRY MOUTH MT LIQD: 15.0000 mL | OROMUCOSAL | Status: DC | PRN

## 2023-05-15 NOTE — Plan of Care (Signed)

## 2023-05-15 NOTE — Progress Notes (Signed)
Chart reviewed. Palliative consultation pending. Patient will be best served with hospice services. Conservative management has been recommended and agreed upon by family. We will sign off.   Gelene Mink, PhD, ANP-BC [facility]

## 2023-05-15 NOTE — TOC Transition Note (Signed)
Transition of Care [facility]) - CM/SW Discharge Note   Patient Details  Name: Maxwell Henderson MRN: [id number] Date of Birth: [birth year]  Transition of Care [facility]) CM/SW Contact:  Leitha Bleak, RN Phone Number: 04/29/2023, 3:00 PM   Clinical Narrative:   S[facility] Loffler accepted and consent have been signed, MD completed DC summary, RN call report asking TOC to schedule transport. EMS has been called.     Final next level of care: Hospice Medical Facility Barriers to Discharge: Barriers Resolved   Patient Goals and CMS Choice CMS Medicare.gov Compare Post Acute Care list provided to:: Patient Represenative (must comment) Choice offered to / list presented to : Spouse  Discharge Placement       Patient and family notified of of transfer: 04/21/2023  Discharge Plan and Services Additional resources added to the After Visit Summary for          Social Determinants of Health (SDOH) Interventions SDOH Screenings   Food Insecurity: Patient Declined (05/12/2023)  Housing: Patient Declined (05/12/2023)  Transportation Needs: No Transportation Needs (05/12/2023)  Utilities: Not At Risk (05/12/2023)  Depression (PHQ2-9): Low Risk  (04/17/2023)  Financial Resource Strain: Unknown (05/03/2019)  Physical Activity: Unknown (05/03/2019)  Social Connections: Unknown (12/27/2021)   Received from [facility]  Stress: Unknown (05/03/2019)  Tobacco Use: Medium Risk (05/08/2023)

## 2023-05-15 NOTE — TOC Initial Note (Signed)
Transition of Care [facility]) - Initial/Assessment Note    Patient Details  Name: Maxwell Henderson MRN: [id number] Date of Birth: [birth year]  Transition of Care [facility]) CM/SW Contact:    Leitha Bleak, RN Phone Number: 05/21/2023, 1:41 PM  Clinical Narrative:            TOC consulted to refer to [facility]. Patient is now comfort care and wants residential hospice. Refer sent. TOC following        Expected Discharge Plan: Hospice Medical Facility Barriers to Discharge: Barriers Resolved   Patient Goals and CMS Choice Patient states their goals for this hospitalization and ongoing recovery are:: comfort care CMS Medicare.gov Compare Post Acute Care list provided to:: Patient Represenative (must comment) Choice offered to / list presented to : Spouse       Activities of Daily Living   ADL Screening (condition at time of admission) Does the patient have a NEW difficulty with bathing/dressing/toileting/self-feeding that is expected to last >3 days?: Yes (Initiates electronic notice to provider for possible OT consult) Does the patient have a NEW difficulty with getting in/out of bed, walking, or climbing stairs that is expected to last >3 days?: Yes (Initiates electronic notice to provider for possible PT consult) Does the patient have a NEW difficulty with communication that is expected to last >3 days?: No Is the patient deaf or have difficulty hearing?: No Does the patient have difficulty seeing, even when wearing glasses/contacts?: No Does the patient have difficulty concentrating, remembering, or making decisions?: No  Permission Sought/Granted     Admission diagnosis:  Malignant neoplasm of lower third of esophagus (HCC) [C15.5] GI bleed [K92.2] Gastrointestinal hemorrhage, unspecified gastrointestinal hemorrhage type [K92.2] Patient Active Problem List   Diagnosis Date Noted   GI bleed 04/24/2023   Esophageal stricture    Esophageal obstruction due to food  impaction    Food impaction of esophagus    Protein-calorie malnutrition, severe (HCC) 08/11/2021   Gastric ulcer with hemorrhage    Hematemesis 08/09/2021   Acute blood loss anemia 08/09/2021   Hypotension 08/09/2021   Hemorrhagic shock (HCC) 08/09/2021   Hyperglycemia 08/09/2021   Former smoker 08/09/2021   Former consumption of alcohol 08/09/2021   Sinus tachycardia 08/09/2021   Nausea and vomiting 08/09/2021   Influenza A 07/16/2021   Acute upper GI bleed 07/16/2021   Abdominal pain 05/03/2019   GERD (gastroesophageal reflux disease) 05/03/2019   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   Intractable abdominal pain 02/19/2019   Esophageal cancer, stage IV (HCC) 02/03/2019   Goals of care, counseling/discussion 11/28/2018   Type 2 diabetes mellitus without complication, without long-term current use of insulin (HCC)    Essential hypertension    SIRS (systemic inflammatory response syndrome) (HCC) 12/11/2017   HCAP (healthcare-associated pneumonia) 12/11/2017   Infection due to parainfluenza virus 3 12/11/2017   PNA (pneumonia) 12/10/2017   Hypokalemia 12/10/2017   Iron deficiency anemia due to chronic blood loss 02/28/2017   Adenocarcinoma of esophagus, 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   Dysphagia 01/05/2017   PCP:  [facility] Public Pharmacy:   [facility] [address] [place] - [address] [place] [address] Phone: [phone] Fax: [phone]     Social Determinants of Health (SDOH) Social History: SDOH Screenings   Food Insecurity: Patient Declined (05/12/2023)  Housing: Patient Declined (05/12/2023)  Transportation Needs: No Transportation Needs (05/12/2023)  Utilities: Not At Risk (05/12/2023)  Depression (PHQ2-9): Low Risk  (04/17/2023)  Financial Resource Strain: Unknown (05/03/2019)  Physical Activity: Unknown (05/03/2019)  Social Connections: Unknown (12/27/2021)   Received from [facility]  Stress: Unknown (05/03/2019)  Tobacco Use: Medium Risk (05/26/23)   SDOH Interventions:

## 2023-05-15 NOTE — Progress Notes (Addendum)
2023/06/04 1739  Attending Physican Contact  Attending Physician Notified Y  Attending Physician (First and Last Name) Standley Dakins, MD  Post Mortem Checklist  Date of Death 06-04-23  Time of Death 1739  Pronounced By Brien Mates, RN & Leanna Sato, RN  Next of kin notified Yes  Name of next of kin notified of death Hascal Garrod  Contact Person's Relationship to Patient Spouse  Contact Person's Phone Number [phone]  Contact Person's address [address], [place], [place], [address]  Family Communication Notes Family at bedside at time of death.  Was the patient a No Code Blue or a Limited Code Blue? Yes  Did the patient die unattended? No  Patient restrained (physical/manual hold/chemical)? Not applicable  Patient and Hospital Property Returned  Patient is satisfied that all belongings have been returned? Yes  Name of person receiving valuables? Garlan Fair  Dermatherapy linen/gowns NOT sent with patient or transporter Not applicable  Dead on Arrival (Emergency Department)  Patient dead on arrival? No  Medical Examiner  Is this a medical examiner's case? N  [facility]  Planned location of pickup [facility] RN called [facility].

## 2023-05-15 NOTE — Discharge Summary (Addendum)
Physician Discharge Summary  Maxwell Henderson UUV:[id number] DOB: 1966-06-10 DOA: 04/28/2023  PCP: [facility]  Admit date: 05/10/2023 Discharge date: 06-06-23 Date of death: 2023/06/06 Time of death: 1738/07/26  Disposition: [facility]  (Pt expired in hospital before he could be transferred to [facility])   Recommendations for Outpatient   SYMPTOM MANAGEMENT PER HOSPICE PROTOCOL   Discharge Condition: HOSPICE   CODE STATUS: DNR DIET: FULL COMFORT CARE    Brief Hospitalization Summary: Please see all hospital notes, images, labs for full details of the hospitalization. ADMISSION PROVIDER HPI:  57 y.o. male with medical history significant of esophageal cancer, diet-controlled diabetes, history of hypertension, currently borderline hypotensive, and more presents to the ED with a chief complaint of hematemesis.  Patient reports that started around 8:30 PM.  He was throwing up constantly.  Wife at bedside reports the whole trash can was full of blood.  Patient has been having a series of hematemesis in the past, but never this constant.  Patient likely has a bleeding tumor in his esophagus given his stage IV esophageal cancer and the fact that he is not getting transfused every week.  His last transfusion was 27-Jul-2023.  Patient reports his last meal was around 3-3 30.  He has no melena, no hematochezia.  He reports an associated periumbilical cramping abdominal pain.  Morphine did not touch the pain per his report.  Patient reports symptoms consistent with anemia like shortness of breath, dizziness, generalized weakness, palpitations.  His pain is no better since treatment in the ED, but he is otherwise feeling some improvement after his transfusion, but nowhere near feeling well.  Patient has no other complaints at this time.   Patient does not smoke and does not drink.  Patient reports that he would want to have a natural death if something happened where his  heart stopped beating.  He is DNR/DNI.   DEATH SUMMARY   Cause of death   Stage IV esophageal adenocarcinoma Hematemesis  Acute blood loss anemia  Hemorrhagic Shock  Severe protein calorie malnutrition  -discussion with wife at beside, pt is terminally ill and exhausted all options for any type of meaningful recovery.  His hg was down again to 7.6 after receiving 4 units, still actively bleeding; wife agreeable to transfusing 2 more units PRBC on 9/29.    Family met with palliative care 2023/06/06  and decided on residential hospice placement.  They requested [facility].  Pt is now on full comfort care orders.  DC to [facility].  Pt is terminally ill.    Pt had another GI hemorrhage in bed became unresponsive and expired in the hospital before he could be transferred to residential hospice facility.  Pt was pronounced at 1739.     Discharge Diagnoses:  Principal Problem:   GI bleed Active Problems:   Acute blood loss anemia   Adenocarcinoma of esophagus, stage 4 (HCC)   Hypokalemia   GERD (gastroesophageal reflux disease)   PEG (percutaneous endoscopic gastrostomy) status (HCC)   Protein-calorie malnutrition, severe (HCC)   Discharge Instructions:  Allergies as of 06-06-23       Reactions   Ketamine And Related Other (See Comments)   intolerance   Oxaliplatin    Chills (intolerance)   Ketamine    Angioedema, GI Intolerance, Mental Status Changes, Diaphoresis / Sweating (intolerance)        Medication List     STOP taking these medications    acetaminophen 500 MG tablet  Commonly known as: TYLENOL   famotidine 20 MG tablet Commonly known as: PEPCID   ferrous sulfate 300 (60 Fe) MG/5ML syrup   methadone 10 MG tablet Commonly known as: DOLOPHINE   Narcan 4 MG/0.1ML Liqd nasal spray kit Generic drug: naloxone   ondansetron 8 MG tablet Commonly known as: ZOFRAN   oxycodone 30 MG immediate release tablet Commonly known as: ROXICODONE   pregabalin 50 MG  capsule Commonly known as: LYRICA   traZODone 50 MG tablet Commonly known as: DESYREL        Allergies  Allergen Reactions   Ketamine And Related Other (See Comments)    intolerance   Oxaliplatin     Chills (intolerance)   Ketamine     Angioedema, GI Intolerance, Mental Status Changes, Diaphoresis / Sweating (intolerance)   Allergies as of 04/24/2023       Reactions   Ketamine And Related Other (See Comments)   intolerance   Oxaliplatin    Chills (intolerance)   Ketamine    Angioedema, GI Intolerance, Mental Status Changes, Diaphoresis / Sweating (intolerance)        Medication List     STOP taking these medications    acetaminophen 500 MG tablet Commonly known as: TYLENOL   famotidine 20 MG tablet Commonly known as: PEPCID   ferrous sulfate 300 (60 Fe) MG/5ML syrup   methadone 10 MG tablet Commonly known as: DOLOPHINE   Narcan 4 MG/0.1ML Liqd nasal spray kit Generic drug: naloxone   ondansetron 8 MG tablet Commonly known as: ZOFRAN   oxycodone 30 MG immediate release tablet Commonly known as: ROXICODONE   pregabalin 50 MG capsule Commonly known as: LYRICA   traZODone 50 MG tablet Commonly known as: DESYREL        Procedures/Studies: DG ABDOMEN PEG TUBE LOCATION  Result Date: 05/13/2023 CLINICAL DATA:  Gastrostomy tube placement. EXAM: ABDOMEN - 1 VIEW COMPARISON:  Chest radiographs 05-28-23. Abdominal radiographs 02/19/2023. CT 05/03/2019. FINDINGS: Two supine views of the abdomen are submitted. Initial image demonstrates a stable metallic gastroesophageal stent in the left upper quadrant, the proximal aspect of which is not visualized. Percutaneous G-tube noted in the left upper quadrant with overlying globular density laterally, incompletely visualized. The bowel gas pattern is nonobstructive. A 2nd image was obtained after injection of 60 cc of Gastrografin through the G-tube. The injected contrast fills the proximal small bowel. No  extravasated contrast is identified. Of note, the lateral margin of the injected contrast is incompletely visualized, although the globular density noted prior to contrast injection does not change. IMPRESSION: Percutaneous gastrostomy/jejunostomy tube appears well positioned within the proximal small bowel. The lateral margin of the injected contrast is incompletely visualized, although no extravasation demonstrated. Electronically Signed   By: Carey Bullocks M.D.   On: 05/13/2023 12:33   DG Chest Port 1 View  Result Date: 05/28/23 CLINICAL DATA:  Hematemesis. GI bleed. History of esophageal cancer. EXAM: PORTABLE CHEST 1 VIEW COMPARISON:  Chest radiograph dated 02/19/2023. FINDINGS: Right-sided Port-A-Cath with tip at the cavoatrial junction. No focal consolidation, pleural effusion, pneumothorax. The cardiac silhouette is within normal limits. Lower esophageal stent. No acute osseous pathology. IMPRESSION: 1. No active disease. 2. Lower esophageal stent. Electronically Signed   By: Elgie Collard M.D.   On: 2023-05-28 22:11      Vitals:   04/30/2023 1000 04/22/2023 1145  BP: (!) 161/108   Pulse: 88   Resp: 13   Temp:  97.8 F (36.6 C)  SpO2: 94%  Vitals:   05/06/2023 0800 04/27/2023 0900 05/06/2023 1000 04/30/2023 1145  BP: (!) 158/101 (!) 152/101 (!) 161/108   Pulse: 86 85 88   Resp: (!) 21 15 13    Temp:    97.8 F (36.6 C)  TempSrc:    Oral  SpO2: 95% 96% 94%   Weight:      Height:        The results of significant diagnostics from this hospitalization (including imaging, microbiology, ancillary and laboratory) are listed below for reference.     Microbiology: Recent Results (from the past 240 hour(s))  MRSA Next Gen by PCR, Nasal     Status: None   Collection Time: 05/12/23 11:40 PM   Specimen: Nasal Mucosa; Nasal Swab  Result Value Ref Range Status   MRSA by PCR Next Gen NOT DETECTED NOT DETECTED Final    Comment: (NOTE) The GeneXpert MRSA Assay (FDA approved for NASAL  specimens only), is one component of a comprehensive MRSA colonization surveillance program. It is not intended to diagnose MRSA infection nor to guide or monitor treatment for MRSA infections. Test performance is not FDA approved in patients less than 82 years old. Performed at [facility], [address], [place], [place] [address]      Labs: BNP (last 3 results) No results for input(s): "BNP" in the last 8760 hours. Basic Metabolic Panel: Recent Labs  Lab 05/12/2023 2150 05/12/23 0340  NA 139 138  K 3.2* 4.0  CL 107 109  CO2 20* 24  GLUCOSE 159* 118*  BUN 13 13  CREATININE 0.70 0.46*  CALCIUM 7.2* 6.7*  MG  --  1.7   Liver Function Tests: Recent Labs  Lab 2023-05-12 2150 05/12/23 0340  AST 101* 44*  ALT 38 32  ALKPHOS 128* 103  BILITOT 0.3 0.4  PROT 5.2* 4.7*  ALBUMIN <1.5* <1.5*   Recent Labs  Lab May 12, 2023 2150  LIPASE 19   No results for input(s): "AMMONIA" in the last 168 hours. CBC: Recent Labs  Lab 05/12/23 2150 05/12/23 0340 05/12/23 0757 05/12/23 1221 05/13/23 0616 05/13/23 1918 04/26/2023 0413  WBC 9.7 8.8 12.6* 11.5* 11.4*  --  10.6*  NEUTROABS 6.7  --   --   --   --   --   --   HGB 6.5* 9.0* 9.8* 9.5* 7.6* 8.9* 11.1*  HCT 20.7* 26.2* 28.1* 28.5* 23.0* 27.0* 32.4*  MCV 89.6 86.5 85.9 86.6 87.1  --  87.1  PLT 313 166 228 202 226  --  193   Cardiac Enzymes: No results for input(s): "CKTOTAL", "CKMB", "CKMBINDEX", "TROPONINI" in the last 168 hours. BNP: Invalid input(s): "POCBNP" CBG: Recent Labs  Lab May 12, 2023 2137  GLUCAP 105*   D-Dimer No results for input(s): "DDIMER" in the last 72 hours. Hgb A1c No results for input(s): "HGBA1C" in the last 72 hours. Lipid Profile No results for input(s): "CHOL", "HDL", "LDLCALC", "TRIG", "CHOLHDL", "LDLDIRECT" in the last 72 hours. Thyroid function studies No results for input(s): "TSH", "T4TOTAL", "T3FREE", "THYROIDAB" in the last 72 hours.  Invalid input(s): "FREET3" Anemia work up No  results for input(s): "VITAMINB12", "FOLATE", "FERRITIN", "TIBC", "IRON", "RETICCTPCT" in the last 72 hours. Urinalysis    Component Value Date/Time   COLORURINE YELLOW 05/03/2019 0325   APPEARANCEUR CLOUDY (A) 05/03/2019 0325   LABSPEC 1.039 (H) 05/03/2019 0325   PHURINE 7.0 05/03/2019 0325   GLUCOSEU NEGATIVE 05/03/2019 0325   HGBUR NEGATIVE 05/03/2019 0325   BILIRUBINUR NEGATIVE 05/03/2019 0325   KETONESUR NEGATIVE 05/03/2019  0325   PROTEINUR NEGATIVE 05/03/2019 0325   NITRITE NEGATIVE 05/03/2019 0325   LEUKOCYTESUR NEGATIVE 05/03/2019 0325   Sepsis Labs Recent Labs  Lab 05/12/23 0757 05/12/23 1221 05/13/23 0616 05/07/2023 0413  WBC 12.6* 11.5* 11.4* 10.6*   Microbiology Recent Results (from the past 240 hour(s))  MRSA Next Gen by PCR, Nasal     Status: None   Collection Time: 2023-06-10 11:40 PM   Specimen: Nasal Mucosa; Nasal Swab  Result Value Ref Range Status   MRSA by PCR Next Gen NOT DETECTED NOT DETECTED Final    Comment: (NOTE) The GeneXpert MRSA Assay (FDA approved for NASAL specimens only), is one component of a comprehensive MRSA colonization surveillance program. It is not intended to diagnose MRSA infection nor to guide or monitor treatment for MRSA infections. Test performance is not FDA approved in patients less than 56 years old. Performed at [facility], [address] [place], [place] [address]    Time coordinating discharge: 38 mins  SIGNED:  Standley Dakins, MD  [facility] 04/21/2023, 5:54 PM How to contact the [facility] Attending or Consulting provider 7A - 7P or covering provider during after hours 7P -7A, for this patient?  Check the care team in [facility] and look for a) attending/consulting [facility] provider listed and b) the [facility] team listed Log into [email or web address] and use [facility]'s universal password to access. If you do not have the password, please contact the hospital operator. Locate the [facility] provider you are looking for under [facility] and page to a number that you can be directly reached. If you still have difficulty reaching the provider, please page the [facility] (Director on Call) for the Hospitalists listed on amion for assistance.

## 2023-05-15 NOTE — Progress Notes (Addendum)
Pt's HR noted to be in the 170's. Pt found unresponsive, sideways in the bed with blood and clots in the bed. Pt had some blood in his mouth as well. MD made aware and the pt's wife was called. Pt cleaned and linens changed. Pt remains minimally responsive at this time. Will continue to monitor.  Transport arrived after pt was cleaned and before family arrived. Decision was made to keep the pt for tonight since he was not stable and the family had not had time see the pt.

## 2023-05-15 NOTE — Consult Note (Addendum)
Consultation Note Date: 05/05/2023   Patient Name: Maxwell Henderson  DOB: 1966/05/11  MRN: [id number]  Age / Sex: 57 y.o., male  PCP: [facility] Referring Physician: Cleora Fleet, MD  Reason for Consultation: Establishing goals of care  HPI/Patient Profile: 57 y.o. male  with past medical history of esophageal cancer treated under the direction of Dr. Kathlene November at [facility], recent progression and worsening performance status with team recommending hospice care, significant weight loss, diet-controlled diabetes, HTN, admitted on 06-10-2023 with GI bleed, acute blood loss anemia.   Clinical Assessment and Goals of Care: I have reviewed medical records including EPIC notes, labs and imaging, received report from RN, assessed the patient.  Maxwell Henderson is resting quietly in bed.  He appears acutely/chronically ill and frail, cachectic.  He greets me making and somewhat keeping eye contact.  He does keep his eyes closed throughout most of our conversation due to his weakness and frailty.  He is oriented, able to make his needs known.  His wife, Jacki Cones, is present at bedside.  As we are talking his sister, Luster Landsberg, arrives.  We meet at the bedside to discuss diagnosis prognosis, GOC, EOL wishes, disposition and options.  I introduced Palliative Medicine as specialized medical care for people living with serious illness. It focuses on providing relief from the symptoms and stress of a serious illness. The goal is to improve quality of life for both the patient and the family.  We then focused on their current illness.  We talked about esophageal cancer burden, GI bleeding, transfusions.  We talk about what the doctors are sharing with patient and wife.  Overall, they seem very knowledgeable about his cancer treatment over the last 6 years and where he stands currently and the treatment plan.  The natural  disease trajectory and expectations at EOL were discussed.  The difference between aggressive medical intervention and comfort care was considered in light of the patient's goals of care.  Addendum: Maxwell Henderson gastric tube bulb was deflated.  We inserted approximately 1 to 1-1/2 inches and 5 cc of air added to goal.  Secured.  No complaints or issues noted.  Tube is ready to be used as needed.   Advanced directives, concepts specific to code status, artifical feeding and hydration, and rehospitalization were considered and discussed.  Maxwell Henderson is DNR.  [place]enrod form completed and placed on chart.  Hospice Care services outpatient were explained and offered.  Jacki Cones shares that they are experienced with hospice care.  She shares that her sister died recently under hospice care.  We talked about the benefits of hospice care both at home and in a residential facility.  We talked about what is and is not provided such as no further IV fluids no blood transfusions.  Maxwell Henderson tells [place] that he is ready for comfort care, residential hospice.  Provider choice offered.  Family elects [facility] as this is close for family visits.  Discussed the importance of continued conversation with family and the medical  providers regarding overall plan of care and treatment options, ensuring decisions are within the context of the patient's values and GOCs.  Questions and concerns were addressed.  The family was encouraged to call with questions or concerns.  PMT will continue to support holistically.  Conference with attending, bedside nursing staff, GI NP, transition of care team related to patient condition, needs, goals of care, disposition.   HCPOA NEXT OF KIN -wife, Dionis Spar.    SUMMARY OF RECOMMENDATIONS   Requesting comfort and dignity at end-of-life, residential hospice [facility], provider choice offered End-of-life order set implemented   Code Status/Advance Care Planning: DNR -DNR/goldenrod  form completed and placed on chart.  Symptom Management:  End-of-life order set implemented  Palliative Prophylaxis:  Frequent Pain Assessment and Palliative Wound Care  Additional Recommendations (Limitations, Scope, Preferences): Full Comfort Care  Psycho-social/Spiritual:  Desire for further Chaplaincy support:no Additional Recommendations: Caregiving  Support/Resources, Education on Hospice, and Grief/Bereavement Support  Prognosis:  < 2 weeks, anticipated based on decreasing functional status, frailty, albumin less than 1.5, patient and family's desire to focus on comfort and dignity, let nature take its course at residential hospice/[facility].  Discharge Planning: Requesting comfort and dignity at end-of-life, residential hospice at [facility].       Primary Diagnoses: Present on Admission:  GI bleed  Hypokalemia  GERD (gastroesophageal reflux disease)  Adenocarcinoma of esophagus, stage 4 (HCC)  Acute blood loss anemia  Protein-calorie malnutrition, severe (HCC)   I have reviewed the medical record, interviewed the patient and family, and examined the patient. The following aspects are pertinent.  Past Medical History:  Diagnosis Date   Diabetes mellitus without complication (HCC)    Hypertension    Iron deficiency anemia due to chronic blood loss 02/28/2017   Primary esophageal adenocarcinoma (HCC) 01/29/2017   Social History   Socioeconomic History   Marital status: Married    Spouse name: Not on file   Number of children: Not on file   Years of education: Not on file   Highest education level: Not on file  Occupational History   Not on file  Tobacco Use   Smoking status: Former    Current packs/day: 0.00    Types: Cigarettes    Quit date: 08/14/2016    Years since quitting: 6.7   Smokeless tobacco: Never  Vaping Use   Vaping status: Never Used  Substance and Sexual Activity   Alcohol use: No    Comment: hx heavy alcohol, hasn't drank in 28 yrs    Drug use: Not Currently    Frequency: 2.0 times per week    Types: Marijuana   Sexual activity: Yes  Other Topics Concern   Not on file  Social History Narrative   WORKING [employer].   Social Determinants of Health   Financial Resource Strain: Unknown (05/03/2019)   Overall Financial Resource Strain (CARDIA)    Difficulty of Paying Living Expenses: Patient declined  Food Insecurity: Patient Declined (05/12/2023)   Hunger Vital Sign    Worried About Running Out of Food in the Last Year: Patient declined    Ran Out of Food in the Last Year: Patient declined  Transportation Needs: No Transportation Needs (05/12/2023)   PRAPARE - Administrator, Civil Service (Medical): No    Lack of Transportation (Non-Medical): No  Physical Activity: Unknown (05/03/2019)   Exercise Vital Sign    Days of Exercise per Week: Patient declined    Minutes of Exercise per  Session: Patient declined  Stress: Unknown (05/03/2019)   [employer] of Occupational Health - Occupational Stress Questionnaire    Feeling of Stress : Patient declined  Social Connections: Unknown (12/27/2021)   [facility]   Social Network    Social Network: Not on file   Family History  Problem Relation Age of Onset   Prostate cancer Father    Colon cancer Neg Hx    Colon polyps Neg Hx    Scheduled Meds:  sodium chloride   Intravenous Once   Chlorhexidine Gluconate Cloth  6 each Topical Daily   methadone  30 mg Per Tube Q8H   mouth rinse  15 mL Mouth Rinse 4 times per day   [START ON 05/15/2023] pantoprazole  40 mg Intravenous Q12H   pregabalin  50 mg Per Tube TID   traZODone  50 mg Per Tube QHS   Continuous Infusions:  sodium chloride 50 mL/hr at 04/29/2023 0937   PRN Meds:.acetaminophen **OR** acetaminophen, HYDROmorphone (DILAUDID) injection, ondansetron **OR** ondansetron (ZOFRAN) IV, mouth rinse, oxyCODONE, prochlorperazine Medications Prior to  Admission:  Prior to Admission medications   Medication Sig Start Date End Date Taking? Authorizing Provider  acetaminophen (TYLENOL) 500 MG tablet Take 1,000 mg by mouth every 6 (six) hours as needed for mild pain or moderate pain.   Yes [provider]  famotidine (PEPCID) 20 MG tablet Take 20 mg by mouth 2 (two) times daily. 05/22/19  Yes [provider]  ferrous sulfate 300 (60 Fe) MG/5ML syrup Take 300 mg by mouth 3 (three) times a week.  Take 5 mL (300 mg total) by mouth 3 (three) times a week. 02/09/23  Yes [provider]  methadone (DOLOPHINE) 10 MG tablet Take by mouth See admin instructions. Take 2.5 tablets by mouth 3 times a day 07/15/21  Yes [provider]  NARCAN 4 MG/0.1ML LIQD nasal spray kit Place 1 spray into the nose once. 09/20/21  Yes [provider]  ondansetron (ZOFRAN) 8 MG tablet Take 8 mg by mouth 3 (three) times daily. 09/07/21  Yes [provider]  oxycodone (ROXICODONE) 30 MG immediate release tablet Take 30 mg by mouth every 4 (four) hours as needed for pain. 07/15/21  Yes [provider]  pregabalin (LYRICA) 50 MG capsule Take 50 mg by mouth 3 (three) times daily. 02/10/22  Yes [provider]  traZODone (DESYREL) 50 MG tablet Take 1 tablet (50 mg total) by mouth at bedtime. 08/14/21  Yes Tat, Onalee Hua, MD   Allergies  Allergen Reactions   Ketamine And Related Other (See Comments)    intolerance   Oxaliplatin     Chills (intolerance)   Ketamine     Angioedema, GI Intolerance, Mental Status Changes, Diaphoresis / Sweating (intolerance)   Review of Systems  Unable to perform ROS: Acuity of condition    Physical Exam Vitals and nursing note reviewed.  Constitutional:      General: He is not in acute distress.    Appearance: He is ill-appearing.  Cardiovascular:     Rate and Rhythm: Normal rate.  Pulmonary:     Effort: Pulmonary effort is normal. No respiratory distress.  Skin:    General:  Skin is warm and dry.  Neurological:     Mental Status: He is alert and oriented to person, place, and time.  Psychiatric:        Mood and Affect: Mood normal.        Behavior: Behavior normal.  Vital Signs: BP (!) 161/108   Pulse 88   Temp 97.8 F (36.6 C) (Oral)   Resp 13   Ht 6\' 1"  (1.854 m)   Wt 63.4 kg   SpO2 94%   BMI 18.44 kg/m  Pain Scale: 0-10   Pain Score: 7    SpO2: SpO2: 94 % O2 Device:SpO2: 94 % O2 Flow Rate: .O2 Flow Rate (L/min): 2 L/min  IO: Intake/output summary:  Intake/Output Summary (Last 24 hours) at 04/16/2023 1207 Last data filed at 04/17/2023 0800 Gross per 24 hour  Intake 2191.08 ml  Output 750 ml  Net 1441.08 ml    LBM: Last BM Date : May 27, 2023 Baseline Weight: Weight: 54.4 kg Most recent weight: Weight: 63.4 kg     Palliative Assessment/Data:     Time In: 0910 Time Out: 1025 Time Total: 75 minutes  Greater than 50%  of this time was spent counseling and coordinating care related to the above assessment and plan.  Signed by: Katheran Awe, NP   Please contact Palliative Medicine Team phone at [phone] for questions and concerns.  For individual provider: See Loretha Stapler

## 2023-05-15 DEATH — deceased

## 2023-05-16 ENCOUNTER — Inpatient Hospital Stay: Payer: Medicaid Other | Admitting: Hematology

## 2023-05-16 ENCOUNTER — Inpatient Hospital Stay: Payer: Medicaid Other

## 2023-05-17 ENCOUNTER — Inpatient Hospital Stay: Payer: Medicaid Other
